# Patient Record
Sex: Male | Born: 1950 | Race: Black or African American | Hispanic: No | Smoking: Former smoker
Health system: Southern US, Community
[De-identification: ages and names within clinical notes are randomized; demographics above are authoritative.]

## PROBLEM LIST (undated history)

## (undated) DIAGNOSIS — J449 Chronic obstructive pulmonary disease, unspecified: Secondary | ICD-10-CM

## (undated) DIAGNOSIS — G8929 Other chronic pain: Secondary | ICD-10-CM

## (undated) DIAGNOSIS — K279 Peptic ulcer, site unspecified, unspecified as acute or chronic, without hemorrhage or perforation: Secondary | ICD-10-CM

## (undated) DIAGNOSIS — I10 Essential (primary) hypertension: Secondary | ICD-10-CM

## (undated) DIAGNOSIS — K852 Alcohol induced acute pancreatitis without necrosis or infection: Secondary | ICD-10-CM

## (undated) DIAGNOSIS — R519 Headache, unspecified: Secondary | ICD-10-CM

## (undated) DIAGNOSIS — E291 Testicular hypofunction: Secondary | ICD-10-CM

## (undated) DIAGNOSIS — C760 Malignant neoplasm of head, face and neck: Secondary | ICD-10-CM

## (undated) DIAGNOSIS — M549 Dorsalgia, unspecified: Secondary | ICD-10-CM

## (undated) DIAGNOSIS — K5790 Diverticulosis of intestine, part unspecified, without perforation or abscess without bleeding: Secondary | ICD-10-CM

## (undated) DIAGNOSIS — K861 Other chronic pancreatitis: Secondary | ICD-10-CM

## (undated) DIAGNOSIS — M25511 Pain in right shoulder: Secondary | ICD-10-CM

## (undated) DIAGNOSIS — R51 Headache: Secondary | ICD-10-CM

## (undated) DIAGNOSIS — E785 Hyperlipidemia, unspecified: Secondary | ICD-10-CM

## (undated) DIAGNOSIS — K219 Gastro-esophageal reflux disease without esophagitis: Secondary | ICD-10-CM

## (undated) DIAGNOSIS — F1010 Alcohol abuse, uncomplicated: Secondary | ICD-10-CM

## (undated) DIAGNOSIS — C642 Malignant neoplasm of left kidney, except renal pelvis: Secondary | ICD-10-CM

## (undated) DIAGNOSIS — K2970 Gastritis, unspecified, without bleeding: Secondary | ICD-10-CM

## (undated) DIAGNOSIS — Z923 Personal history of irradiation: Secondary | ICD-10-CM

## (undated) DIAGNOSIS — J189 Pneumonia, unspecified organism: Secondary | ICD-10-CM

## (undated) HISTORY — DX: Malignant neoplasm of left kidney, except renal pelvis: C64.2

## (undated) HISTORY — PX: LUNG BIOPSY: SHX232

## (undated) HISTORY — DX: Testicular hypofunction: E29.1

## (undated) HISTORY — DX: Other chronic pancreatitis: K86.1

## (undated) HISTORY — PX: ESOPHAGOGASTRODUODENOSCOPY: SHX1529

## (undated) HISTORY — PX: MOUTH SURGERY: SHX715

## (undated) HISTORY — DX: Alcohol induced acute pancreatitis without necrosis or infection: K85.20

## (undated) HISTORY — DX: Hyperlipidemia, unspecified: E78.5

## (undated) HISTORY — DX: Peptic ulcer, site unspecified, unspecified as acute or chronic, without hemorrhage or perforation: K27.9

## (undated) HISTORY — DX: Chronic obstructive pulmonary disease, unspecified: J44.9

---

## 1997-08-23 DIAGNOSIS — K279 Peptic ulcer, site unspecified, unspecified as acute or chronic, without hemorrhage or perforation: Secondary | ICD-10-CM

## 1997-08-23 HISTORY — DX: Peptic ulcer, site unspecified, unspecified as acute or chronic, without hemorrhage or perforation: K27.9

## 2001-08-23 HISTORY — PX: COLONOSCOPY: SHX174

## 2002-06-27 ENCOUNTER — Ambulatory Visit (HOSPITAL_COMMUNITY): Admission: RE | Admit: 2002-06-27 | Discharge: 2002-06-27 | Payer: Self-pay | Admitting: General Surgery

## 2003-08-24 HISTORY — PX: COLONOSCOPY: SHX174

## 2004-05-14 ENCOUNTER — Ambulatory Visit (HOSPITAL_COMMUNITY): Admission: RE | Admit: 2004-05-14 | Discharge: 2004-05-14 | Payer: Self-pay | Admitting: General Surgery

## 2006-08-23 HISTORY — PX: COLONOSCOPY: SHX174

## 2006-12-28 ENCOUNTER — Ambulatory Visit (HOSPITAL_COMMUNITY): Admission: RE | Admit: 2006-12-28 | Discharge: 2006-12-28 | Payer: Self-pay | Admitting: General Surgery

## 2007-11-04 ENCOUNTER — Inpatient Hospital Stay (HOSPITAL_COMMUNITY): Admission: EM | Admit: 2007-11-04 | Discharge: 2007-11-06 | Payer: Self-pay | Admitting: Emergency Medicine

## 2007-11-05 ENCOUNTER — Ambulatory Visit: Payer: Self-pay | Admitting: Internal Medicine

## 2007-11-06 ENCOUNTER — Ambulatory Visit: Payer: Self-pay | Admitting: Internal Medicine

## 2008-06-22 ENCOUNTER — Emergency Department (HOSPITAL_COMMUNITY): Admission: EM | Admit: 2008-06-22 | Discharge: 2008-06-22 | Payer: Self-pay | Admitting: Emergency Medicine

## 2010-08-23 DIAGNOSIS — C642 Malignant neoplasm of left kidney, except renal pelvis: Secondary | ICD-10-CM

## 2010-08-23 HISTORY — PX: PARTIAL NEPHRECTOMY: SHX414

## 2010-08-23 HISTORY — DX: Malignant neoplasm of left kidney, except renal pelvis: C64.2

## 2010-08-31 ENCOUNTER — Ambulatory Visit (HOSPITAL_COMMUNITY)
Admission: RE | Admit: 2010-08-31 | Discharge: 2010-08-31 | Payer: Self-pay | Source: Home / Self Care | Attending: Family Medicine | Admitting: Family Medicine

## 2010-10-29 ENCOUNTER — Emergency Department (HOSPITAL_COMMUNITY)
Admission: EM | Admit: 2010-10-29 | Discharge: 2010-10-29 | Disposition: A | Discharge status: Home / Self Care | Payer: Medicaid Other | Attending: Emergency Medicine | Admitting: Emergency Medicine

## 2010-10-29 ENCOUNTER — Emergency Department (HOSPITAL_COMMUNITY): Payer: Medicaid Other

## 2010-10-29 ENCOUNTER — Encounter (HOSPITAL_COMMUNITY): Payer: Self-pay | Admitting: Radiology

## 2010-10-29 DIAGNOSIS — W309XXA Contact with unspecified agricultural machinery, initial encounter: Secondary | ICD-10-CM | POA: Insufficient documentation

## 2010-10-29 DIAGNOSIS — R51 Headache: Secondary | ICD-10-CM | POA: Insufficient documentation

## 2010-10-29 DIAGNOSIS — I10 Essential (primary) hypertension: Secondary | ICD-10-CM | POA: Insufficient documentation

## 2010-10-29 DIAGNOSIS — S0280XA Fracture of other specified skull and facial bones, unspecified side, initial encounter for closed fracture: Secondary | ICD-10-CM | POA: Insufficient documentation

## 2010-10-29 DIAGNOSIS — S32009A Unspecified fracture of unspecified lumbar vertebra, initial encounter for closed fracture: Secondary | ICD-10-CM | POA: Insufficient documentation

## 2010-10-29 DIAGNOSIS — T148XXA Other injury of unspecified body region, initial encounter: Secondary | ICD-10-CM | POA: Insufficient documentation

## 2010-10-29 DIAGNOSIS — R079 Chest pain, unspecified: Secondary | ICD-10-CM | POA: Insufficient documentation

## 2010-10-29 DIAGNOSIS — R0602 Shortness of breath: Secondary | ICD-10-CM | POA: Insufficient documentation

## 2010-10-29 DIAGNOSIS — E119 Type 2 diabetes mellitus without complications: Secondary | ICD-10-CM | POA: Insufficient documentation

## 2010-10-29 HISTORY — DX: Essential (primary) hypertension: I10

## 2010-10-29 LAB — BASIC METABOLIC PANEL
Calcium: 9.7 mg/dL (ref 8.4–10.5)
GFR calc non Af Amer: 58 mL/min — ABNORMAL LOW (ref >60)
Glucose, Bld: 101 mg/dL — ABNORMAL HIGH (ref 70–99)
Sodium: 140 mEq/L (ref 135–145)

## 2010-10-29 MED ORDER — IOHEXOL 300 MG/ML  SOLN
100.0000 mL | Freq: Once | INTRAMUSCULAR | Status: AC | PRN
  Administered 2010-10-29: 100 mL via INTRAVENOUS

## 2010-11-04 ENCOUNTER — Other Ambulatory Visit (HOSPITAL_COMMUNITY): Payer: Self-pay | Admitting: Family Medicine

## 2010-11-04 DIAGNOSIS — N281 Cyst of kidney, acquired: Secondary | ICD-10-CM

## 2010-11-10 ENCOUNTER — Ambulatory Visit (HOSPITAL_COMMUNITY)
Admission: RE | Admit: 2010-11-10 | Discharge: 2010-11-10 | Payer: Medicaid Other | Source: Ambulatory Visit | Attending: Family Medicine | Admitting: Family Medicine

## 2010-11-26 ENCOUNTER — Ambulatory Visit (HOSPITAL_COMMUNITY)
Admission: RE | Admit: 2010-11-26 | Discharge: 2010-11-26 | Disposition: A | Discharge status: Home / Self Care | Payer: Self-pay | Source: Ambulatory Visit | Attending: Family Medicine | Admitting: Family Medicine

## 2010-11-26 ENCOUNTER — Other Ambulatory Visit (HOSPITAL_COMMUNITY): Payer: Self-pay | Admitting: Family Medicine

## 2010-11-26 ENCOUNTER — Encounter (HOSPITAL_COMMUNITY): Payer: Self-pay

## 2010-11-26 DIAGNOSIS — K769 Liver disease, unspecified: Secondary | ICD-10-CM | POA: Insufficient documentation

## 2010-11-26 DIAGNOSIS — N281 Cyst of kidney, acquired: Secondary | ICD-10-CM | POA: Insufficient documentation

## 2010-11-26 MED ORDER — IOHEXOL 350 MG/ML SOLN
100.0000 mL | Freq: Once | INTRAVENOUS | Status: AC | PRN
  Administered 2010-11-26: 100 mL via INTRAVENOUS

## 2011-01-05 NOTE — H&P (Signed)
NAMEKHAMBREL, AMSDEN               ACCOUNT NO.:  [id number]   MEDICAL RECORD NO.:  [id number]          PATIENT TYPE:  INP   LOCATION:  A213                          FACILITY:  [facility]   PHYSICIAN:  Catalina Pizza, M.D.        DATE OF BIRTH:  04-Jul-1951   DATE OF ADMISSION:  11/04/2007  DATE OF DISCHARGE:  LH                              HISTORY & PHYSICAL   PRIMARY DOCTOR:  Dr. John Giovanni   CHIEF COMPLAINT:  Blood in his stool.   HISTORY OF PRESENT ILLNESS:  Mr. Anthis is a pleasant 60 year old  gentleman who noted yesterday evening some dark discoloration in his  stool shortly followed by bright red blood per rectum.  He stated he had  some rumbling in his stomach earlier in the day that precipitated this  but did not have any significant pain.  He states that he had had an  episode of something similar to this back in July 2005, had a  colonoscopy with some colon polyps, but they did not feel that was  causing the bleed previously.  He recently had a colonoscopy by Dr.  Lovell Sheehan in May, I do not have the full results of that, but was told  everything was fine.  Of note, he also has been taking approximately six  BC Powder a day routinely for last several weeks but may have been  longer than that.  The patient was seen in the emergency department with  hemoglobin 12.3 and admitted due to a significant amount of blood in  stool.   PAST MEDICAL HISTORY:  Significant for:  1. Diabetes.  2. Hypercholesterolemia.  3. Chronic low-back pain.   PAST SURGICAL HISTORY:  None.   ALLERGIES:  No known drug allergies.   MEDICATIONS:  1. Metformin 500 mg t.i.d.  2. Vytorin 10/80 daily.  3. Omeprazole 20 mg b.i.d.  4. Vicodin 5/500 one to two a day p.r.n. along with a significant      amount of BC Powder.   REVIEW OF SYSTEMS:  The patient denies any problems with vision.  No  problems with hearing.  No problems with swallowing.  No shortness of  breath.  No chest pain.  No abdominal  pains.  No problems with  constipation or diarrhea.  Loose stools since noting the blood.  No  lower extremity swelling.  Denies any neurologic deficits.   SOCIAL HISTORY:  Lives with his mother, aunt and uncle.  Is on  disability, previously did [employer].  Smokes approximately two to  three cigarettes per day.   FAMILY HISTORY:  Mother still alive, has rheumatoid arthritis and  question of esophageal strictures.  No other cancers noted.  Father died  in his 66s.  History is relatively unknown.  No brothers or sisters.   PHYSICAL EXAMINATION:  VITAL SIGNS:  Temperature is afebrile, blood  pressure 132/70, pulse rate 72, respirations 20, saturating 97% on room  air.  GENERAL:  This is an [place] American male lying in bed in no acute  distress.  HEENT:  Unremarkable.  Pupils equal and react to  light accommodation.  No JVD.  No thyromegaly.  LUNGS:  Show a few end-expiratory wheezes at the bases bilaterally, but  good air movement throughout.  No rhonchi or crackles.  HEART:  Regular rate and rhythm.  No murmurs appreciated.  ABDOMEN:  Soft, nondistended, positive bowel sounds.  EXTREMITIES:  No lower extremity edema, cyanosis or clubbing.  NEUROLOGIC:  Alert and oriented x3.  No deficits noted.  RECTAL:  Performed in the emergency department which revealed gross  bright red blood.  No signs of acute bleeding hemorrhoid.   LABORATORY DATA:  CBC shows a white count of 8.9, hemoglobin of 12.3,  platelet count of 15.8, MCV of 88.7.  BMET shows sodium 141, potassium  4.7, chloride 109, CO2 27, glucose 81, BUN 15, creatinine 1.28, calcium  9.3.  PT is less than 10.  INR is not calculated.  Hepatic function was  normal with total protein 6.4, albumin of 3.7.   IMPRESSION:  This is a 60 year old African American male with apparent  lower gastrointestinal bleed here for further evaluation.   ASSESSMENT AND PLAN:  1. Lower gastrointestinal bleed.  This is most likely secondary to       question some colonic ulceration related to large amounts of BC      Powder, definitely contributing to thinning of his blood.  Does      have a history of colon polyps but did have a recent colonoscopy      back in May.  I do not have those results available at this time.      Also, possibility of diverticular bleed given his history.  Will      continue on a clear liquid diet for now and get GI to assess      further.  Will continue to monitor his hemoglobin.  Is not having a      continual bleeding from his rectum at this time.  No signs of      thinning blood per lab work obtained.  2. Diabetes mellitus type 2.  Will continue the metformin three times      a day a previously prescribed.  3. Hyperlipidemia.  Continue with Vytorin.  4. History of peptic ulcer disease, gastroesophageal reflux disease.      Continue with the omeprazole 20 mg b.i.d.  5. Tobacco abuse.  Educated the patient on quitting.  He is only      smoking approximately two to three per day.  6. Chronic back pain.  Will continue with p.r.n. Vicodin as needed in      the hospital.  He states he takes approximately one to two every      day on a routine basis.   DISPOSITION:  The patient will be admitted to a telemetry floor.  Large-  bore IV available.  Continue on low amount of fluid.  Will recheck lab  work in the morning and get a GI consult for this.      Catalina Pizza, M.D.  Electronically Signed     ZH/MEDQ  D:  11/04/2007  T:  11/05/2007  Job:  [id number]

## 2011-01-05 NOTE — Group Therapy Note (Signed)
NAME:  Colin Wagner, Colin [name]:  [id number]   MEDICAL RECORD NO.:  [id number]          PATIENT TYPE:  INP   LOCATION:  A213                          FACILITY:  [facility]   PHYSICIAN:  Catalina Pizza, M.D.        DATE OF BIRTH:  [birth year]   DATE OF PROCEDURE:  11/05/2007  DATE OF DISCHARGE:                                 PROGRESS NOTE   SUBJECTIVE:  Colin Wagner as a 60 year old African-American gentleman who  was admitted for hematochezia and bright red blood per rectum of unknown  origin.  He has not had any more episodes of this.  He was seen and  assessed by Tana Coast, PA for Dr. Jena Gauss, and suggestions made in her  consultation to continue the PPI therapy and supportive measures, and  will continue to monitor his therapy.  He states he has not had any more  blood in the stools.  He did have a normal bowel movement without any  black tarry stools or blood in them.  This morning, his hemoglobin did  drop slightly and will need to continue to monitor this.  Suggest also  to continue with clear liquid diet.   OBJECTIVE:  VITAL SIGNS:  Temperature is 97.2, blood pressure 136/75,  pulse 78, respirations 22, satting 96% on room air.  CBCs have been 97  and 103 respectively.  GENERAL:  This is an African-American male lying in bed in no acute  distress.  HEENT:  Unremarkable.  LUNGS:  Clear to auscultation bilaterally.  HEART:  Regular rate and rhythm.  ABDOMEN:  Protuberant, soft, nontender.  Positive bowel sounds.  EXTREMITIES:  No lower extremity edema.   LABORATORY DATA:  CBC shows a white count of 7.1, hemoglobin of 11.1  which is down from 12.3.  BMET shows a sodium of 138, potassium 4.0,  chloride 108, CO2 of 25, glucose 87, BUN 10, creatinine 1.12, calcium  8.6.   IMPRESSION:  This is a 60 year old with hematochezia or unknown source.  Question diverticular versus colitis type bleed.   ASSESSMENT/PLAN:  1. Lower gastrointestinal bleed.  Was seen and assessed by  GI, and      will follow along with their suggestion, likely related to aspirin-      induced type colitis versus diverticular type bleed.  Is not having      any significant problems at this time.  Awaiting further follow up      on hemoglobin and hematocrit to see if he has any further drop in      his hemoglobin.  Continue on a clear diet for now until assessed by      Dr. Jena Gauss to see if he wants to do a colonoscopy if it continues to      trend down.  2. Diabetes mellitus type 2 diabetes.  Continue on current regimen.      Appears to be doing well thus far.  3. Hyperlipidemia.  Continue on Vytorin.  4. Tobacco abuse.  Educated again on quitting.  5. Chronic back pain.  Apparently is doing well.  Has Vicodin  p.r.n.      for this.   DISPOSITION:  The patient will be followed up by Dr. Sudie Bailey and Dr.  Jena Gauss and see if there are any further interventions that need to be  done prior to discharge.      Catalina Pizza, M.D.  Electronically Signed     ZH/MEDQ  D:  11/05/2007  T:  11/05/2007  Job:  [id number]

## 2011-01-05 NOTE — Consult Note (Signed)
Colin Wagner, Colin Wagner               ACCOUNT NO.:  [id number]   MEDICAL RECORD NO.:  [id number]          PATIENT TYPE:  INP   LOCATION:  A213                          FACILITY:  [facility]   PHYSICIAN:  R. Roetta Sessions, M.D. DATE OF BIRTH:  11-26-1950   DATE OF CONSULTATION:  11/05/2007  DATE OF DISCHARGE:                                 CONSULTATION   PHYSICIAN REQUESTING CONSULTATION:  Mila Homer. Sudie Bailey, MD   REASON FOR CONSULTATION:  GI bleed.   HISTORY OF PRESENT ILLNESS:  The patient is a 59 year old African  American gentleman who presents with history of hematochezia.  Friday  evening, he began having an rumbling in his stomach.  He fell like he  was going to have diarrhea, almost like he had taken a laxative.  He had  5 or 6 stools which were loose and mixed with blood.  He describes the  blood as dark red, moderate amount.  He denies any abdominal pain.  His  last bloody stool was yesterday around noon.  He decided to come to the  emergency department for evaluation.  He denies any black stool.  He  says he has had this before as well.  He is had multiple colonoscopies  as outlined below.  He is concerned that his problem might be the amount  of BC Powders he is taking; he is taking up to 6 a day; has taken them  for a couple years this time, but off and on for years.  He denies any  weight loss, nausea, vomiting, heartburn, dysphagia or odynophagia.   MEDICATIONS AT HOME:  1. Vytorin 10/80 mg daily.  2. Metformin 500 mg t.i.d.  3. Omeprazole 20 mg b.i.d.  4. Vicodin 5/500 mg every 6 hours as needed.  5. BC powders 6 daily.   ALLERGIES:  No known drug allergies.   PAST MEDICAL HISTORY:  1. Diabetes mellitus.  2. Hyperlipidemia.  3. Hypertension.  4. He has a history of gastric ulcers diagnosed in 1999 by Dr. Elpidio Anis.  Biopsies were benign and no H. pylori present.  In 2001, it      was noted that his gastric ulcers had healed.  His last EGD was in  September 2005 by Dr. Katrinka Blazing and at that time, he had gastritis.  5. In November 2003, he did have polyps at time of colonoscopy by Dr.      Katrinka Blazing.  On 2005 colonoscopy, he had multiple diverticula noted from      the cecum to the sigmoid colon.  His last colonoscopy was by Dr.      Lovell Sheehan, May 2008; he was noted to have mild sigmoid      diverticulosis.   FAMILY HISTORY:  Negative for chronic GI illnesses, colorectal cancer,  peptic ulcer disease.   SOCIAL HISTORY:  He is single.  He lives with his mother and aunt.  He  smokes 2 cigarettes daily, trying to quit.  No alcohol or drug use.   REVIEW OF SYSTEMS:  See HPI for GI and CONSTITUTIONAL.  CARDIOPULMONARY:  He  denies any chest pain, shortness of breath, palpitations, or cough.   PHYSICAL EXAM:  VITAL SIGNS:  Temperature 96.8, pulse 67, respirations  15, blood pressure 133/71, O2 SATs 96% on room air.  GENERAL:  Pleasant, well-nourished, well-developed black gentleman in no  acute distress.  SKIN:  Warm and dry.  No jaundice.  HEENT:  Sclerae anicteric.  Oropharyngeal mucosa moist and pink.  NECK:  No lymphadenopathy.  CHEST:  Lungs clear to auscultation.  CARDIAC:  Exam reveals regular rate and rhythm, normal S1 and S2, no  murmurs, rubs or gallops.  ABDOMEN:  Positive bowel sounds.  The abdomen is soft, nontender and  nondistended.  No organomegaly or masses.  No rebound or guarding.  No  abdominal bruits or hernias.  RECTAL:  On rectal exam done in the ED, he was noted to have gross  blood, otherwise normal.  LOWER EXTREMITIES:  No edema.   LABORATORY DATA:  White count 7100, hemoglobin on admission 12.3 and is  11.1 this morning, MCV was normal, platelets 185,000.  Sodium 138,  potassium 4, BUN 10, creatinine 1.12. INR 1. LFTs normal with albumin of  3.7.   IMPRESSION:  The patient is 60 year old gentleman with hematochezia,  likely due to a lower gastrointestinal bleed from diverticular source.  Hemoglobin has dropped  minimally from 12.3 down to 11.1.  He is  hemodynamically stable.  There has been no overt bleeding noted since  his admission.  Given his significant aspirin use, however, we cannot  rule out an gastrointestinal bleed due to aspirin-induced colitis,  ulcers and so forth.  He does not appear to be having rapid transit  upper gastrointestinal bleed, given his hemodynamic stability, however.   RECOMMENDATIONS:  1. We will continue PPI therapy.  2. Supportive measures.  3. We will continue a clear liquid diet today.  We will monitor him      closely.  Most likely he will not need to have      a colonoscopy.  We will discuss further with Dr. Jena Gauss. Further      recommendations to follow.  4. H&H at noon and CBC in the morning.   I would like to thank Dr. John Giovanni for allowing [place] to take part  in the care of this patient.      Tana Coast, P.AJonathon Bellows, M.D.  Electronically Signed    LL/MEDQ  D:  11/05/2007  T:  11/05/2007  Job:  [id number]   cc:   Mila Homer. Sudie Bailey, M.D.  Fax: [phone]

## 2011-01-05 NOTE — Discharge Summary (Signed)
Colin Wagner, Colin Wagner               ACCOUNT NO.:  [id number]   MEDICAL RECORD NO.:  [id number]          PATIENT TYPE:  INP   LOCATION:  A213                          FACILITY:  [facility]   PHYSICIAN:  Mila Homer. Sudie Bailey, M.D.DATE OF BIRTH:  06-07-1951   DATE OF ADMISSION:  11/04/2007  DATE OF DISCHARGE:  03/16/2009LH                               DISCHARGE SUMMARY   A 60 year old was admitted to the hospital with GI bleeding.  He had a  benign 3-day hospitalization extending from November 04, 2007 to November 06, 2007.  Vital signs remained stable.   His admission white cell count was 8900.  The H&H 12.3 and 35.8, and  platelet count normal 208,000.  Following day, his hemoglobin was 11.1  and the day of discharge 11.4.  His admission BMET showed an estimated  GFR of greater than 60 and this stayed about the same.  His hepatic  function tests were normal.  INR 1.0.   He was admitted to the hospital by Dr. Catalina Pizza, on call from me.  He  was put on a clear-liquid diet, put on IV normal saline 75 mL an hour.  There was a GI consult due to the bleeding and by today, he was able to  have his diet advanced to 1800-calorie ADA heart-healthy diet.   The patient did well at this time.  Since the bleeding appeared to be  bright red, and since it was therefore lower GI tract, and since his  fairly recent colonoscopy had been essentially normal, it was elected  not to do another colonoscopy.  Particularly, since he did well,  bleeding stopped and his hemoglobin was stable.   He was discharged home his third day.   DIAGNOSES:  1. Lower gastrointestinal bleed.  2. 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  3. Type 2 diabetes mellitus.  4. Hypercholesterolemia.  5. History of peptic ulcer disease, gastroesophageal reflux disease.  6. Tobacco use disorder.  7. Headaches.  8. Chronic back pain.   He is being recommended to continue his metformin 500 mg t.i.d., Vytorin  10/80 daily, omeprazole 20  mg b.i.d., and Vicodin 5/500 as needed for  pain.  Also, I told him if it just is a minor headache to go ahead with  Tylenol Extra  Strength 2 q.i.d. since he rarely uses the Vicodin.  He should stay away  from [facility] powders, Advil, aspirin, BC, and other NSAIDs.  Use Tylenol  for headache and follow up with me in 4 days.  We will recheck on his  hemoglobin.      Mila Homer. Sudie Bailey, M.D.  Electronically Signed     SDK/MEDQ  D:  11/06/2007  T:  11/06/2007  Job:  [id number]

## 2011-01-08 NOTE — H&P (Signed)
Colin Wagner, Colin Wagner               ACCOUNT NO.:  [id number]   MEDICAL RECORD NO.:  [id number]          PATIENT TYPE:  AMB   LOCATION:                                FACILITY:  [facility]   PHYSICIAN:  Dalia Heading, M.D.  DATE OF BIRTH:  [birth year]   DATE OF ADMISSION:  12/28/2006  DATE OF DISCHARGE:  LH                              HISTORY & PHYSICAL   CHIEF COMPLAINT:  History of colon polyps.   HISTORY OF PRESENT ILLNESS:  The patient is a 60 year old black male who  is referred for endoscopic evaluation.  He needs a colonoscopy for  followup of colon polyps.  He last had a colonoscopy by Dr. Katrinka Blazing in  2003.  Ascending colon polyps were removed.  The patient now presents  for a followup colonoscopy.  The patient denies abdominal pain, melena,  or hematochezia.  There is no family history of colon carcinoma.   PAST MEDICAL HISTORY:  Non-insulin-dependent diabetes mellitus.   PAST SURGICAL HISTORY:  Unremarkable.   CURRENT MEDICATIONS:  Metformin, Vytorin, glipizide, Allegra.   ALLERGIES:  No known drug allergies.   SOCIAL HISTORY:  The patient smokes daily.  He denies any alcohol use.   PHYSICAL EXAMINATION:  GENERAL:  The patient is a well-developed, well-  nourished black male in no acute distress.  LUNGS:  Clear to auscultation with equal breath sounds bilaterally.  HEART:  Examination reveals a regular rate and rhythm without S3, S4, or  murmurs.  ABDOMEN:  Soft, nontender and non-distended.  No hepatosplenomegaly or  masses are noted.  RECTAL:  Examination was deferred to the procedure.   IMPRESSION:  Colon polyps.   PLAN:  The patient is scheduled for a colonoscopy on Dec 28, 2006.  The  risks and benefits of the procedure including bleeding and perforation  were fully explained to the patient, who gave informed consent.      Dalia Heading, M.D.  Electronically Signed     MAJ/MEDQ  D:  12/22/2006  T:  12/22/2006  Job:  [id number]   cc:   Mila Homer. Sudie Bailey,  M.D.  Fax: [phone]

## 2011-01-08 NOTE — H&P (Signed)
NAME:  Colin Wagner, Colin Wagner               ACCOUNT NO.:  [id number]   MEDICAL RECORD NO.:  [id number]          PATIENT TYPE:  AMB   LOCATION:  DAY                           FACILITY:  [facility]   PHYSICIAN:  Jerolyn Shin C. Katrinka Blazing, M.D.   DATE OF BIRTH:  1951-01-08   DATE OF ADMISSION:  DATE OF DISCHARGE:  LH                                HISTORY & PHYSICAL   A 60 year old male with a history of recurrent colon polyps, dating back to  2003, with the episode of recurrent rectal bleeding.  The patient also has a  history of diverticulosis.  Previous polyps were in the ascending colon  cecum and were adenomatous polyps.  The patient had no hemorrhoids  __________  that could be a source of bleeding.  It was, therefore, felt  that he should have a repeat colonoscopy.  The patient also has a history of  peptic ulcer disease with a history of gastroduodenitis and gastric ulcers.  He has used excess BC Powders with up to six BC Powders per day.  He is,  therefore, scheduled for upper and lower endoscopy.   PAST HISTORY:  1.  The patient has a history of chronic pain, especially low back pain.  2.  Hypercholesterolemia.   MEDICATIONS:  No new medications.   PHYSICAL EXAMINATION:  VITAL SIGNS:  Blood pressure 140/90, pulse 80,  respirations 18, weight 198 pounds.  HEENT:  Unremarkable.  NECK:  Supple.  No JVD, bruit, adenopathy, or thyromegaly.  CHEST:  Clear to auscultation.  HEART:  Regular rate and rhythm without murmur, gallop or rub.  ABDOMEN:  Soft and nontender.  No masses.  EXTREMITIES:  No cyanosis, clubbing, or edema.  NEUROLOGIC:  No focal motor, or sensory, or cerebellar deficits.   IMPRESSION:  1.  History of recurrent rectal bleeding.  2.  History of peptic ulcer disease with evidence of gastric ulcers and      heavy salicylate use.  3.  Prior history of alcohol abuse.   PLAN:  EGD and colonoscopy.    LCS/MEDQ  D:  05/14/2004  T:  05/14/2004  Job:  [id number]

## 2011-01-25 ENCOUNTER — Other Ambulatory Visit (HOSPITAL_COMMUNITY): Payer: Self-pay | Admitting: Family Medicine

## 2011-01-25 DIAGNOSIS — N289 Disorder of kidney and ureter, unspecified: Secondary | ICD-10-CM

## 2011-01-28 ENCOUNTER — Ambulatory Visit (HOSPITAL_COMMUNITY)
Admission: RE | Admit: 2011-01-28 | Discharge: 2011-01-28 | Disposition: A | Discharge status: Home / Self Care | Payer: PRIVATE HEALTH INSURANCE | Source: Ambulatory Visit | Attending: Family Medicine | Admitting: Family Medicine

## 2011-01-28 DIAGNOSIS — N289 Disorder of kidney and ureter, unspecified: Secondary | ICD-10-CM

## 2011-01-28 DIAGNOSIS — R9389 Abnormal findings on diagnostic imaging of other specified body structures: Secondary | ICD-10-CM | POA: Insufficient documentation

## 2011-01-28 MED ORDER — GADOBENATE DIMEGLUMINE 529 MG/ML IV SOLN
19.0000 mL | Freq: Once | INTRAVENOUS | Status: AC | PRN
  Administered 2011-01-28: 19 mL via INTRAVENOUS

## 2011-03-23 ENCOUNTER — Other Ambulatory Visit (HOSPITAL_COMMUNITY): Payer: Self-pay | Admitting: Urology

## 2011-03-23 ENCOUNTER — Ambulatory Visit (HOSPITAL_COMMUNITY)
Admission: RE | Admit: 2011-03-23 | Discharge: 2011-03-23 | Disposition: A | Discharge status: Home / Self Care | Payer: PRIVATE HEALTH INSURANCE | Source: Ambulatory Visit | Attending: Urology | Admitting: Urology

## 2011-03-23 DIAGNOSIS — I771 Stricture of artery: Secondary | ICD-10-CM | POA: Insufficient documentation

## 2011-03-23 DIAGNOSIS — C649 Malignant neoplasm of unspecified kidney, except renal pelvis: Secondary | ICD-10-CM | POA: Insufficient documentation

## 2011-03-23 DIAGNOSIS — E119 Type 2 diabetes mellitus without complications: Secondary | ICD-10-CM | POA: Insufficient documentation

## 2011-04-28 ENCOUNTER — Other Ambulatory Visit: Payer: Self-pay | Admitting: Urology

## 2011-04-28 ENCOUNTER — Encounter (HOSPITAL_COMMUNITY): Payer: Medicaid Other

## 2011-04-28 LAB — CBC
HCT: 41.5 % (ref 39.0–52.0)
MCV: 83.5 fL (ref 78.0–100.0)
RDW: 15.3 % (ref 11.5–15.5)
WBC: 7 10*3/uL (ref 4.0–10.5)

## 2011-04-28 LAB — BASIC METABOLIC PANEL
BUN: 23 mg/dL (ref 6–23)
Chloride: 102 mEq/L (ref 96–112)
Creatinine, Ser: 1.1 mg/dL (ref 0.50–1.35)
GFR calc non Af Amer: >60 mL/min (ref >60)
Glucose, Bld: 142 mg/dL — ABNORMAL HIGH (ref 70–99)
Sodium: 138 mEq/L (ref 135–145)

## 2011-04-28 LAB — SURGICAL PCR SCREEN: Staphylococcus aureus: NEGATIVE

## 2011-05-03 ENCOUNTER — Inpatient Hospital Stay (HOSPITAL_COMMUNITY)
Admission: RE | Admit: 2011-05-03 | Discharge: 2011-05-05 | DRG: 657 | Disposition: A | Discharge status: Home / Self Care | Payer: Medicaid Other | Source: Ambulatory Visit | Attending: Urology | Admitting: Urology

## 2011-05-03 ENCOUNTER — Other Ambulatory Visit: Payer: Self-pay | Admitting: Urology

## 2011-05-03 DIAGNOSIS — C649 Malignant neoplasm of unspecified kidney, except renal pelvis: Principal | ICD-10-CM | POA: Diagnosis present

## 2011-05-03 DIAGNOSIS — I10 Essential (primary) hypertension: Secondary | ICD-10-CM | POA: Diagnosis present

## 2011-05-03 DIAGNOSIS — E871 Hypo-osmolality and hyponatremia: Secondary | ICD-10-CM | POA: Diagnosis present

## 2011-05-03 DIAGNOSIS — K219 Gastro-esophageal reflux disease without esophagitis: Secondary | ICD-10-CM | POA: Diagnosis present

## 2011-05-03 DIAGNOSIS — E119 Type 2 diabetes mellitus without complications: Secondary | ICD-10-CM | POA: Diagnosis present

## 2011-05-03 DIAGNOSIS — Z87891 Personal history of nicotine dependence: Secondary | ICD-10-CM

## 2011-05-03 DIAGNOSIS — Z01812 Encounter for preprocedural laboratory examination: Secondary | ICD-10-CM

## 2011-05-03 DIAGNOSIS — E7800 Pure hypercholesterolemia, unspecified: Secondary | ICD-10-CM | POA: Diagnosis present

## 2011-05-03 LAB — BASIC METABOLIC PANEL
CO2: 22 mEq/L (ref 19–32)
Chloride: 97 mEq/L (ref 96–112)
Glucose, Bld: 184 mg/dL — ABNORMAL HIGH (ref 70–99)
Sodium: 130 mEq/L — ABNORMAL LOW (ref 135–145)

## 2011-05-03 LAB — TYPE AND SCREEN: Antibody Screen: NEGATIVE

## 2011-05-03 LAB — GLUCOSE, CAPILLARY: Glucose-Capillary: 134 mg/dL — ABNORMAL HIGH (ref 70–99)

## 2011-05-04 LAB — BASIC METABOLIC PANEL
BUN: 11 mg/dL (ref 6–23)
Chloride: 101 mEq/L (ref 96–112)
Creatinine, Ser: 1.02 mg/dL (ref 0.50–1.35)
GFR calc Af Amer: >60 mL/min (ref >60)
Glucose, Bld: 112 mg/dL — ABNORMAL HIGH (ref 70–99)

## 2011-05-04 LAB — GLUCOSE, CAPILLARY
Glucose-Capillary: 117 mg/dL — ABNORMAL HIGH (ref 70–99)
Glucose-Capillary: 157 mg/dL — ABNORMAL HIGH (ref 70–99)

## 2011-05-04 LAB — CREATININE, FLUID (PLEURAL, PERITONEAL, JP DRAINAGE): Creat, Fluid: 1.2 mg/dL

## 2011-05-05 LAB — BASIC METABOLIC PANEL
BUN: 13 mg/dL (ref 6–23)
Calcium: 9.1 mg/dL (ref 8.4–10.5)
Chloride: 100 mEq/L (ref 96–112)
Creatinine, Ser: 1.05 mg/dL (ref 0.50–1.35)
GFR calc Af Amer: >60 mL/min (ref >60)
GFR calc non Af Amer: >60 mL/min (ref >60)

## 2011-05-05 LAB — GLUCOSE, CAPILLARY: Glucose-Capillary: 202 mg/dL — ABNORMAL HIGH (ref 70–99)

## 2011-05-13 NOTE — Op Note (Signed)
NAMEJARMAINE, Colin [name]:  [id number]  MEDICAL RECORD NO.:  [id number]  LOCATION:  1404                         FACILITY:  [facility]  PHYSICIAN:  Heloise Purpura, MD      DATE OF BIRTH:  1951/02/13  DATE OF PROCEDURE:  05/03/2011 DATE OF DISCHARGE:                              OPERATIVE REPORT   PREOPERATIVE DIAGNOSIS:  Left renal neoplasm.  POSTOPERATIVE DIAGNOSIS:  Left renal neoplasm.  PROCEDURE: 1. Left robotic-assisted laparoscopic partial nephrectomy. 2. Intraoperative renal ultrasound.  SURGEON:  Heloise Purpura, MD  ASSISTANT:  Delia Chimes, [facility]  ANESTHESIA:  General.  COMPLICATIONS:  None.  ESTIMATED BLOOD LOSS:  100 cc.  INTRAVENOUS FLUIDS:  2700 cc of lactated Ringer's.  INTRAOPERATIVE FINDINGS: 1. Warm ischemia time to the left kidney was 24 minutes. 2. Intraoperative ultrasound of the left kidney demonstrated a 1.6-cm     renal mass over the anterolateral aspect of the left kidney     consistent with his preoperative imaging.  SPECIMEN:  Left renal neoplasm.  DISPOSITION OF SPECIMEN:  To pathology.  INDICATIONS:  Mr. Paige is a 60 year old gentleman who was incidentally found to have an enhancing left renal neoplasm concerning for malignancy.  He was evaluated with a metastatic evaluation, which was negative.  After reviewing management options for treatment, he elected to proceed with surgical therapy in a nephron-sparing approach as stated above with the stated procedure.  The potential risks, complications, and alternative treatment options were discussed in detail and informed consent obtained.  DESCRIPTION OF PROCEDURE:  The patient was taken to the operating room and a general anesthetic was administered.  He was given preoperative antibiotics, placed in the left lateral decubitus position, and prepped and draped in the usual sterile fashion.  He was then prepped and draped in the usual sterile fashion and a site was selected  just to the left of the umbilicus toward the lateral aspect of the rectus muscle and carried down through the subcutaneous tissues using electrocautery.  The underlying fascial and muscular layers were then divided and separated allowing entry into the peritoneal cavity under direct vision without difficulty.  A 12-mm port was then placed and a pneumoperitoneum established.  8 mm robotic ports were then placed in the left upper quadrant, left lower quadrant, and far left lower quadrant.  A 12-mm port was placed in the upper midline for laparoscopic assistance.  All ports were placed under direct vision without difficulty.  The surgical cart was then docked.  The patient was noted have some adhesions between the colon and the anterior abdominal wall and these were carefully taken down.  This required quite extensive mobilization of the colon in order to expose the kidney.  This was performed without significant complications.  Once the colon was mobilized, the space between the mesocolon and the anterior layer of Gerota fascia was developed thereby isolating the kidney away from the colon and colonic mesentery.  The ureter and gonadal vein were identified and were able to be lifted anteriorly off the psoas muscle and dissection proceeded superiorly to the renal vein.  The gonadal vein was seen entering the renal vein and the renal vein was carefully  dissected free using a combination of sharp and blunt dissection.  Once the renal vein was isolated, attention turned to the renal artery.  There was noted to be an excessive amount of perinephric fat and this required some added dissection.  However, a single renal artery was identified just superior and posterior to the renal vein.  It was able to be isolated with a combination of sharp and blunt dissection and once the vessels were felt to be adequately isolated for renal hilar clamping, attention turned to the kidney itself.  12.5 g of  intravenous mannitol was administered while the perinephric fat was removed from the surface of the kidney.  Once the entire anterolateral aspect of the kidney was exposed, no readily apparent renal neoplasm was initially identified.  Therefore, the kidney was completely mobilized within Gerota fascia and further identified. There was some adherent perinephric fat on the anterolateral aspect of the kidney and it was felt that the tumor might possibly be beneath this fatty tissue versus completely endophytic.  At this point, the laparoscopic ultrasound was used intraoperatively to image the kidney. Imaging of the kidney did reveal a 1.6-cm solid-appearing mass that was located just below the adherent patch of perinephric fat on the anterior aspect of the kidney.  This correlated with preoperative imaging findings as well.  The margins of the resection were then marked out having utilized ultrasound to define these margins.  Preparations were then made for hilar clamping and a bulldog clamp was placed on the renal artery.  Using sharp scissor dissection, the renal parenchyma was then incised around the previously identified margin and taken deeply into the renal medulla.  Grossly, the tumor was excised in its entirety with no definite entries into the collecting system noted.  Once the tumor had been excised, preparations were made for reconstruction of the kidney.  A 3-0 V-Loc suture was brought through the renal capsule into the parenchymal defect and secured with a Hem-o-lok clip on the capsular surface.  It was then run through the renal defect for hemostatic purposes in the deep resected bed.  This also provided some initial renal compression.  It was then brought through the opposite end of the renal capsule and again secured with a Hem-o-lok on this side.  A double- armed 2-0 V-Loc suture was then brought through the renal capsule for the capsular reapproximation and renal  compression.  This suture was brought through the kidney in a horizontal mattress fashion and prior to securing the suture, FloSeal was placed into the renal defect.  This resulted in excellent hemostasis once the V-Loc suture was cinched down. Hem-o-lok clips were then placed on the end of the V-Loc sutures and these were cut and all needles were removed.  The bulldog clamp was removed and there appeared to be excellent hemostasis.  Additional FloSeal was placed near the renal hilum and the total warm ischemia time to the left kidney was 24 minutes.  The pneumoperitoneum was let down and there continued to be excellent hemostasis.  A #15 Blake drain was brought through the fourth arm robotic port and positioned in the perinephric space appropriately.  It was secured to skin with a nylon suture.  The surgical cart was then undocked.  Utilizing the remaining ports, the upper midline 12-mm port site was closed with a 0 Vicryl suture placed laparoscopically.  The remaining ports were removed under direct vision, and the renal tumor specimen which had been placed into an Endopouch retrieval bag, was  now removed via the camera port incision without difficulty.  This opening was then closed with two  0 Vicryl sutures at the fascial level and all port sites were injected with Exparel and reapproximated at the skin level with 4-0 Monocryl subcuticular closures.  Dermabond was applied to the skin.  The patient tolerated the procedure well without complications.  He was able to be extubated and transferred to the recovery unit in satisfactory condition.     Heloise Purpura, MD     LB/MEDQ  D:  05/03/2011  T:  05/03/2011  Job:  [id number]  Electronically Signed by Heloise Purpura MD on 05/13/2011 08:12:21 PM

## 2011-05-13 NOTE — Discharge Summary (Signed)
  NAMELESHON, [name]:  [id number]  MEDICAL RECORD NO.:  [id number]  LOCATION:  1404                         FACILITY:  [facility]  PHYSICIAN:  Heloise Purpura, MD      DATE OF BIRTH:  March 12, 1951  DATE OF ADMISSION:  05/03/2011 DATE OF DISCHARGE:  05/05/2011                              DISCHARGE SUMMARY   REASON FOR ADMISSION:  Left renal neoplasm.  DISCHARGE DIAGNOSIS:  Renal cell carcinoma.  HISTORY AND PHYSICAL:  For full details, please see admission history and physical.  Briefly, [name] is a 60 year old gentleman who was found to have an incidentally detected enhancing left renal neoplasm concerning for malignancy.  He underwent metastatic evaluation, which was negative.  After reviewing management options for treatment, he elected to proceed with minimally invasive nephron-sparing surgery.  SECONDARY DIAGNOSIS:  Diabetes.  HOSPITAL COURSE:  On May 03, 2011, the patient was taken to the operating room and underwent a left robotic-assisted laparoscopic partial nephrectomy.  He tolerated this procedure well without complications.  He did require the use of intraoperative ultrasound to identify his mass and he was able to be transferred to a regular hospital room following recovery from anesthesia.  Postoperatively, he was initially maintained on strict bedrest and remained hemodynamically stable.  His hemoglobin was 12.5 postoperatively.  His renal function also remained stable and his creatinine was 1.05 upon discharge.  On postoperative day #1, he began ambulating which he did without significant difficulty and was transitioned to oral pain medication.  In addition, his diet was gradually advanced to a diabetic diet throughout the day, which he tolerated well.  He did have return of bowel function. He maintained excellent urine output with minimal output from his perinephric drain.  His perinephric drain was checked for a fluid creatinine  and this was found to be 1.2 consistent with serum.  It was therefore removed on the morning of postoperative day #2.  He remained stable and had met all discharge criteria on the morning of postoperative day #2.  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  The implications of this pathology report was discussed with the patient during his hospitalization.  DISPOSITION:  Home.  DISCHARGE MEDICATIONS:  He will resume all of his regular home medications.  In addition, he has been given a prescription to continue hydrocodone to take as needed for pain.  He does use his medication chronically on a very intermittent basis for back pain and will use this more frequently as needed for incisional pain.  DISCHARGE INSTRUCTIONS:  He will resume his diet as before hospital admission.  He will ambulate, but refrain from any strenuous activity, driving, or heavy lifting.  FOLLOWUP:  He will follow up as scheduled in 3 to 4 weeks for further postoperative evaluation and to recheck his renal function.     Heloise Purpura, MD     LB/MEDQ  D:  05/05/2011  T:  05/05/2011  Job:  [id number]  Electronically Signed by Heloise Purpura MD on 05/13/2011 08:12:18 PM

## 2011-05-17 LAB — BASIC METABOLIC PANEL
BUN: 15
CO2: 25
Calcium: 9.3
Calcium: 8.6
Creatinine, Ser: 1.28
GFR calc Af Amer: >60
GFR calc non Af Amer: 58 — ABNORMAL LOW
GFR calc non Af Amer: >60
Glucose, Bld: 81
Glucose, Bld: 87
Potassium: 4.7
Potassium: 4
Sodium: 138

## 2011-05-17 LAB — HEPATIC FUNCTION PANEL
ALT: 20
Albumin: 3.7
Alkaline Phosphatase: 45
Indirect Bilirubin: 0.3
Total Protein: 6.4

## 2011-05-17 LAB — CBC
HCT: 31.8 — ABNORMAL LOW
HCT: 32.5 — ABNORMAL LOW
Hemoglobin: 11.1 — ABNORMAL LOW
Hemoglobin: 11.4 — ABNORMAL LOW
MCHC: 35
MCV: 89.7
Platelets: 208
RBC: 3.57 — ABNORMAL LOW
RBC: 3.62 — ABNORMAL LOW
RDW: 15.8 — ABNORMAL HIGH
WBC: 8.9
WBC: 6.7

## 2011-05-17 LAB — DIFFERENTIAL
Basophils Absolute: 0
Basophils Relative: 0
Basophils Relative: 0
Eosinophils Absolute: 0.1
Eosinophils Relative: 1
Eosinophils Relative: 2
Lymphocytes Relative: 19
Lymphocytes Relative: 23
Lymphs Abs: 1.7
Lymphs Abs: 1.5
Lymphs Abs: 1.6
Monocytes Absolute: 0.5
Monocytes Relative: 6
Monocytes Relative: 7
Monocytes Relative: 9
Neutro Abs: 4.8
Neutrophils Relative %: 74

## 2011-11-02 ENCOUNTER — Other Ambulatory Visit (HOSPITAL_COMMUNITY): Payer: Self-pay | Admitting: Family Medicine

## 2011-11-02 ENCOUNTER — Ambulatory Visit (HOSPITAL_COMMUNITY)
Admission: RE | Admit: 2011-11-02 | Discharge: 2011-11-02 | Disposition: A | Discharge status: Home / Self Care | Payer: Medicaid Other | Source: Ambulatory Visit | Attending: Family Medicine | Admitting: Family Medicine

## 2011-11-02 DIAGNOSIS — M25511 Pain in right shoulder: Secondary | ICD-10-CM

## 2011-11-02 DIAGNOSIS — M25519 Pain in unspecified shoulder: Secondary | ICD-10-CM | POA: Insufficient documentation

## 2011-11-26 ENCOUNTER — Ambulatory Visit (HOSPITAL_COMMUNITY)
Admission: RE | Admit: 2011-11-26 | Discharge: 2011-11-26 | Disposition: A | Discharge status: Home / Self Care | Payer: Medicaid Other | Source: Ambulatory Visit | Attending: Urology | Admitting: Urology

## 2011-11-26 ENCOUNTER — Other Ambulatory Visit (HOSPITAL_COMMUNITY): Payer: Self-pay | Admitting: Urology

## 2011-11-26 DIAGNOSIS — Z87891 Personal history of nicotine dependence: Secondary | ICD-10-CM | POA: Insufficient documentation

## 2011-11-26 DIAGNOSIS — C649 Malignant neoplasm of unspecified kidney, except renal pelvis: Secondary | ICD-10-CM

## 2011-11-26 DIAGNOSIS — E119 Type 2 diabetes mellitus without complications: Secondary | ICD-10-CM | POA: Insufficient documentation

## 2011-12-03 ENCOUNTER — Other Ambulatory Visit (HOSPITAL_COMMUNITY): Payer: Self-pay | Admitting: Urology

## 2011-12-03 ENCOUNTER — Ambulatory Visit (HOSPITAL_COMMUNITY)
Admission: RE | Admit: 2011-12-03 | Discharge: 2011-12-03 | Disposition: A | Discharge status: Home / Self Care | Payer: Medicaid Other | Source: Ambulatory Visit | Attending: Urology | Admitting: Urology

## 2011-12-03 DIAGNOSIS — E119 Type 2 diabetes mellitus without complications: Secondary | ICD-10-CM | POA: Insufficient documentation

## 2011-12-03 DIAGNOSIS — C649 Malignant neoplasm of unspecified kidney, except renal pelvis: Secondary | ICD-10-CM | POA: Insufficient documentation

## 2011-12-03 DIAGNOSIS — R911 Solitary pulmonary nodule: Secondary | ICD-10-CM | POA: Insufficient documentation

## 2011-12-20 ENCOUNTER — Other Ambulatory Visit: Payer: Self-pay | Admitting: Urology

## 2011-12-20 ENCOUNTER — Ambulatory Visit (HOSPITAL_COMMUNITY)
Admission: RE | Admit: 2011-12-20 | Discharge: 2011-12-20 | Disposition: A | Discharge status: Home / Self Care | Payer: Medicaid Other | Source: Ambulatory Visit | Attending: Urology | Admitting: Urology

## 2011-12-20 DIAGNOSIS — C649 Malignant neoplasm of unspecified kidney, except renal pelvis: Secondary | ICD-10-CM | POA: Insufficient documentation

## 2011-12-20 DIAGNOSIS — R918 Other nonspecific abnormal finding of lung field: Secondary | ICD-10-CM | POA: Insufficient documentation

## 2012-06-05 ENCOUNTER — Ambulatory Visit (HOSPITAL_COMMUNITY)
Admission: RE | Admit: 2012-06-05 | Discharge: 2012-06-05 | Disposition: A | Discharge status: Home / Self Care | Payer: Medicaid Other | Source: Ambulatory Visit | Attending: Urology | Admitting: Urology

## 2012-06-05 ENCOUNTER — Other Ambulatory Visit (HOSPITAL_COMMUNITY): Payer: Self-pay | Admitting: Urology

## 2012-06-05 DIAGNOSIS — Z87891 Personal history of nicotine dependence: Secondary | ICD-10-CM | POA: Insufficient documentation

## 2012-06-05 DIAGNOSIS — C649 Malignant neoplasm of unspecified kidney, except renal pelvis: Secondary | ICD-10-CM

## 2012-06-05 DIAGNOSIS — E119 Type 2 diabetes mellitus without complications: Secondary | ICD-10-CM | POA: Insufficient documentation

## 2013-04-02 ENCOUNTER — Ambulatory Visit (HOSPITAL_COMMUNITY)
Admission: RE | Admit: 2013-04-02 | Discharge: 2013-04-02 | Disposition: A | Discharge status: Home / Self Care | Payer: Medicaid Other | Source: Ambulatory Visit | Attending: Family Medicine | Admitting: Family Medicine

## 2013-04-02 ENCOUNTER — Other Ambulatory Visit (HOSPITAL_COMMUNITY): Payer: Self-pay | Admitting: Family Medicine

## 2013-04-02 DIAGNOSIS — J189 Pneumonia, unspecified organism: Secondary | ICD-10-CM

## 2013-06-04 ENCOUNTER — Ambulatory Visit (HOSPITAL_COMMUNITY)
Admission: RE | Admit: 2013-06-04 | Discharge: 2013-06-04 | Disposition: A | Discharge status: Home / Self Care | Payer: Medicaid Other | Source: Ambulatory Visit | Attending: Urology | Admitting: Urology

## 2013-06-04 ENCOUNTER — Other Ambulatory Visit (HOSPITAL_COMMUNITY): Payer: Self-pay | Admitting: Urology

## 2013-06-04 DIAGNOSIS — C649 Malignant neoplasm of unspecified kidney, except renal pelvis: Secondary | ICD-10-CM

## 2013-06-04 DIAGNOSIS — Z87891 Personal history of nicotine dependence: Secondary | ICD-10-CM | POA: Insufficient documentation

## 2014-02-07 ENCOUNTER — Encounter (INDEPENDENT_AMBULATORY_CARE_PROVIDER_SITE_OTHER): Payer: Self-pay

## 2014-02-07 ENCOUNTER — Ambulatory Visit (HOSPITAL_COMMUNITY)
Admission: RE | Admit: 2014-02-07 | Discharge: 2014-02-07 | Disposition: A | Discharge status: Home / Self Care | Payer: Medicaid Other | Source: Ambulatory Visit | Attending: Internal Medicine | Admitting: Internal Medicine

## 2014-02-07 ENCOUNTER — Other Ambulatory Visit (HOSPITAL_COMMUNITY): Payer: Self-pay | Admitting: Internal Medicine

## 2014-02-07 DIAGNOSIS — M545 Low back pain, unspecified: Secondary | ICD-10-CM

## 2014-02-07 DIAGNOSIS — K861 Other chronic pancreatitis: Secondary | ICD-10-CM | POA: Insufficient documentation

## 2014-02-07 DIAGNOSIS — M51379 Other intervertebral disc degeneration, lumbosacral region without mention of lumbar back pain or lower extremity pain: Secondary | ICD-10-CM | POA: Insufficient documentation

## 2014-02-07 DIAGNOSIS — M5137 Other intervertebral disc degeneration, lumbosacral region: Secondary | ICD-10-CM | POA: Insufficient documentation

## 2014-03-03 ENCOUNTER — Encounter (HOSPITAL_COMMUNITY): Payer: Self-pay | Admitting: Emergency Medicine

## 2014-03-03 ENCOUNTER — Emergency Department (HOSPITAL_COMMUNITY)
Admission: EM | Admit: 2014-03-03 | Discharge: 2014-03-03 | Disposition: A | Discharge status: Home / Self Care | Payer: Medicaid Other | Attending: Emergency Medicine | Admitting: Emergency Medicine

## 2014-03-03 ENCOUNTER — Emergency Department (HOSPITAL_COMMUNITY): Payer: Medicaid Other

## 2014-03-03 DIAGNOSIS — R05 Cough: Secondary | ICD-10-CM | POA: Diagnosis present

## 2014-03-03 DIAGNOSIS — R51 Headache: Secondary | ICD-10-CM | POA: Diagnosis not present

## 2014-03-03 DIAGNOSIS — J40 Bronchitis, not specified as acute or chronic: Secondary | ICD-10-CM

## 2014-03-03 DIAGNOSIS — IMO0001 Reserved for inherently not codable concepts without codable children: Secondary | ICD-10-CM | POA: Diagnosis not present

## 2014-03-03 DIAGNOSIS — J209 Acute bronchitis, unspecified: Secondary | ICD-10-CM | POA: Diagnosis not present

## 2014-03-03 DIAGNOSIS — R059 Cough, unspecified: Secondary | ICD-10-CM | POA: Diagnosis present

## 2014-03-03 DIAGNOSIS — Z792 Long term (current) use of antibiotics: Secondary | ICD-10-CM | POA: Diagnosis not present

## 2014-03-03 DIAGNOSIS — I10 Essential (primary) hypertension: Secondary | ICD-10-CM | POA: Diagnosis not present

## 2014-03-03 DIAGNOSIS — Z79899 Other long term (current) drug therapy: Secondary | ICD-10-CM | POA: Insufficient documentation

## 2014-03-03 DIAGNOSIS — E119 Type 2 diabetes mellitus without complications: Secondary | ICD-10-CM | POA: Insufficient documentation

## 2014-03-03 LAB — CBG MONITORING, ED: GLUCOSE-CAPILLARY: 179 mg/dL — AB (ref 70–99)

## 2014-03-03 MED ORDER — ALBUTEROL SULFATE HFA 108 (90 BASE) MCG/ACT IN AERS
1.0000 | INHALATION_SPRAY | Freq: Four times a day (QID) | RESPIRATORY_TRACT | Status: DC | PRN
Start: 2014-03-03 — End: 2016-09-25

## 2014-03-03 MED ORDER — AZITHROMYCIN 250 MG PO TABS: ORAL_TABLET | ORAL | Status: DC

## 2014-03-03 NOTE — ED Notes (Signed)
MD at bedside. 

## 2014-03-03 NOTE — ED Notes (Signed)
Pt reports nonproductive cough, runny nose, body aches, and chills since Friday.

## 2014-03-03 NOTE — ED Provider Notes (Signed)
CSN: [id number]     Arrival date & time 03/03/14  9528 History  This chart was scribed for Nat Christen, MD by Vernell Barrier, ED scribe. This patient was seen in room APA18/APA18 and the patient's care was started at 7:35 AM.    Chief Complaint  Patient presents with  . URI   The history is provided by the patient. No language interpreter was used.   HPI Comments: STEN DEMATTEO is a 63 y.o. male who presents to the Emergency Department with 2 days of some mild clear to yellow productive cough, rhinorrhea, myalgias. Onset of chills last night. Myalgias primarily in the lower extremities. Also report mild frontal HA; believes it may be related to allergies. Denies smoking cigarettes. Denies SOB and wheezing.   PCP: Dr. Karie Kirks  Past Medical History  Diagnosis Date  . Diabetes mellitus   . Hypertension    No past surgical history on file. No family history on file. History  Substance Use Topics  . Smoking status: Not on file  . Smokeless tobacco: Not on file  . Alcohol Use: Not on file    Review of Systems  Constitutional: Positive for chills.  HENT: Positive for rhinorrhea.   Respiratory: Positive for cough. Negative for wheezing.   Musculoskeletal: Positive for myalgias.  Neurological: Positive for headaches.   A complete 10 system review of systems was obtained and all systems are negative except as noted in the HPI and PMH.   Allergies  Review of patient's allergies indicates no known allergies.  Home Medications   Prior to Admission medications   Medication Sig Start Date End Date Taking? Authorizing Provider  albuterol (PROVENTIL HFA;VENTOLIN HFA) 108 (90 BASE) MCG/ACT inhaler Inhale 1-2 puffs into the lungs every 6 (six) hours as needed for wheezing or shortness of breath. 03/03/14   Nat Christen, MD  azithromycin (ZITHROMAX Z-PAK) 250 MG tablet 2 po day one, then 1 daily x 4 days 03/03/14   Nat Christen, MD   Triage vitals: BP 128/81  Pulse 108  Temp(Src) 99.1 F  (37.3 C) (Oral)  Resp 18  Ht 6' (1.829 m)  Wt 191 lb (86.637 kg)  BMI 25.90 kg/m2  SpO2 95%  Physical Exam  Nursing note and vitals reviewed. Constitutional: He is oriented to person, place, and time. He appears well-developed and well-nourished.  HENT:  Head: Normocephalic and atraumatic.  Right Ear: External ear normal.  Left Ear: External ear normal.  Mouth/Throat: Oropharynx is clear and moist.  Eyes: Conjunctivae and EOM are normal. Pupils are equal, round, and reactive to light.  Neck: Normal range of motion. Neck supple.  Cardiovascular: Normal rate, regular rhythm and normal heart sounds.   No murmur heard. Pulmonary/Chest: Effort normal and breath sounds normal. No respiratory distress. He has no wheezes. He has no rales.  Abdominal: Soft. Bowel sounds are normal.  Musculoskeletal: Normal range of motion.  Neurological: He is alert and oriented to person, place, and time.  Skin: Skin is warm and dry.  Psychiatric: He has a normal mood and affect. His behavior is normal.    ED Course  Procedures (including critical care time) DIAGNOSTIC STUDIES: Oxygen Saturation is 95% on room air, normal by my interpretation.    COORDINATION OF CARE: At 7:36 AM: Discussed treatment plan with patient which includes chest x-ray. Patient agrees.   Results for orders placed during the hospital encounter of 03/03/14  CBG MONITORING, ED      Result Value Ref Range   Glucose-Capillary  179 (*) 70 - 99 mg/dL    Labs Review Labs Reviewed  CBG MONITORING, ED - Abnormal; Notable for the following:    Glucose-Capillary 179 (*)    All other components within normal limits    Imaging Review Dg Chest 2 View  03/03/2014   CLINICAL DATA:  Cough and fever.  EXAM: CHEST  2 VIEW  COMPARISON:  06/04/2013.  06/05/2012.  03/23/2011.  06/22/2008.  FINDINGS: Mediastinum and hilar structures are normal. Changes of scarring right middle lobe and lingula. Heart size normal. No acute infiltrate. No  pleural effusion or pneumothorax. No acute bony abnormality .  IMPRESSION: Right middle lobe and lingular scarring. No acute cardiopulmonary disease.   Electronically Signed   By: Marcello Moores  Register   On: 03/03/2014 08:02     EKG Interpretation None      MDM   Final diagnoses:  Bronchitis   patient is nontoxic. Productive sputum. Patient is diabetic. Will Rx antibiotic and inhaler.    I personally performed the services described in this documentation, which was scribed in my presence. The recorded information has been reviewed and is accurate.     Nat Christen, MD 03/03/14 [phone]

## 2014-03-03 NOTE — Discharge Instructions (Signed)
Bronchitis Bronchitis is swelling (inflammation) of the air tubes leading to your lungs (bronchi). This causes mucus and a cough. If the swelling gets bad, you may have trouble breathing. HOME CARE   Rest.  Drink enough fluids to keep your pee (urine) clear or pale yellow (unless you have a condition where you have to watch how much you drink).  Only take medicine as told by your doctor. If you were given antibiotic medicines, finish them even if you start to feel better.  Avoid smoke, irritating chemicals, and strong smells. These make the problem worse. Quit smoking if you smoke. This helps your lungs heal faster.  Use a cool mist humidifier. Change the water in the humidifier every day. You can also sit in the bathroom with hot shower running for 5-10 minutes. Keep the door closed.  See your health care provider as told.  Wash your hands often. GET HELP IF: Your problems do not get better after 1 week. GET HELP RIGHT AWAY IF:   Your fever gets worse.  You have chills.  Your chest hurts.  Your problems breathing get worse.  You have blood in your mucus.  You pass out (faint).  You feel light-headed.  You have a bad headache.  You throw up (vomit) again and again. MAKE SURE YOU:  Understand these instructions.  Will watch your condition.  Will get help right away if you are not doing well or get worse. Document Released: 01/26/2008 Document Revised: 08/14/2013 Document Reviewed: 04/03/2013 [facility] Patient Information 2015 [place], [place]. This information is not intended to replace advice given to you by your health care provider. Make sure you discuss any questions you have with your health care provider.  Chest x-ray shows no pneumonia. Prescription for antibiotic and inhaler. Increase fluids. Tylenol or ibuprofen for fever.

## 2014-05-28 ENCOUNTER — Other Ambulatory Visit (HOSPITAL_COMMUNITY): Payer: Self-pay | Admitting: Urology

## 2014-05-28 DIAGNOSIS — D49519 Neoplasm of unspecified behavior of unspecified kidney: Secondary | ICD-10-CM

## 2014-06-07 ENCOUNTER — Ambulatory Visit (HOSPITAL_COMMUNITY)
Admission: RE | Admit: 2014-06-07 | Discharge: 2014-06-07 | Disposition: A | Discharge status: Home / Self Care | Payer: Medicaid Other | Source: Ambulatory Visit | Attending: Urology | Admitting: Urology

## 2014-06-07 ENCOUNTER — Other Ambulatory Visit (HOSPITAL_COMMUNITY): Payer: Self-pay | Admitting: Urology

## 2014-06-07 DIAGNOSIS — D49519 Neoplasm of unspecified behavior of unspecified kidney: Secondary | ICD-10-CM

## 2014-06-07 DIAGNOSIS — D495 Neoplasm of unspecified behavior of other genitourinary organs: Secondary | ICD-10-CM | POA: Insufficient documentation

## 2014-06-07 DIAGNOSIS — Z905 Acquired absence of kidney: Secondary | ICD-10-CM | POA: Diagnosis not present

## 2014-06-07 MED ORDER — GADOBENATE DIMEGLUMINE 529 MG/ML IV SOLN
18.0000 mL | Freq: Once | INTRAVENOUS | Status: AC | PRN
  Administered 2014-06-07: 18 mL via INTRAVENOUS

## 2014-08-23 DIAGNOSIS — K852 Alcohol induced acute pancreatitis without necrosis or infection: Secondary | ICD-10-CM

## 2014-08-23 HISTORY — DX: Alcohol induced acute pancreatitis without necrosis or infection: K85.20

## 2014-09-13 ENCOUNTER — Emergency Department (HOSPITAL_COMMUNITY)
Admission: EM | Admit: 2014-09-13 | Discharge: 2014-09-13 | Disposition: A | Discharge status: Home / Self Care | Payer: Medicaid Other | Attending: Emergency Medicine | Admitting: Emergency Medicine

## 2014-09-13 ENCOUNTER — Encounter (HOSPITAL_COMMUNITY): Payer: Self-pay

## 2014-09-13 DIAGNOSIS — R369 Urethral discharge, unspecified: Secondary | ICD-10-CM | POA: Diagnosis present

## 2014-09-13 DIAGNOSIS — E119 Type 2 diabetes mellitus without complications: Secondary | ICD-10-CM | POA: Insufficient documentation

## 2014-09-13 DIAGNOSIS — N4889 Other specified disorders of penis: Secondary | ICD-10-CM | POA: Diagnosis not present

## 2014-09-13 DIAGNOSIS — I10 Essential (primary) hypertension: Secondary | ICD-10-CM | POA: Insufficient documentation

## 2014-09-13 DIAGNOSIS — Z87891 Personal history of nicotine dependence: Secondary | ICD-10-CM | POA: Diagnosis not present

## 2014-09-13 MED ORDER — BUTENAFINE HCL 1 % EX CREA: TOPICAL_CREAM | CUTANEOUS | Status: DC

## 2014-09-13 MED ORDER — FLUCONAZOLE 100 MG PO TABS
200.0000 mg | ORAL_TABLET | Freq: Once | ORAL | Status: AC
  Administered 2014-09-13: 200 mg via ORAL
  Filled 2014-09-13: qty 2

## 2014-09-13 NOTE — Discharge Instructions (Signed)
You were treated in the emergency department today with Diflucan. Please cleanse the affected area of your penis with soap and water daily. Please use the Lotrimin ultra to the affected area 2 times daily. Please have this reevaluated next week at Dr. Karie Kirks.

## 2014-09-13 NOTE — ED Provider Notes (Signed)
CSN: [id number]     Arrival date & time 09/13/14  1010 History   First MD Initiated Contact with Patient 09/13/14 1024     Chief Complaint  Patient presents with  . Penile Discharge     (Consider location/radiation/quality/duration/timing/severity/associated sxs/prior Treatment) HPI Comments: Patient is a 64 year old male who presents to the emergency department with a complaint of rash on my penis. Patient states that he is a diabetic. He states that from time to time he has a "yeast infection" on his penis. He has been evaluated for this by his primary physician in the past. He states that usually cause all helps. His doctor has been out of the office for the past 2 days, and will not be in the office today due to the weather, the patient states that it is quite irritating rubbing against his underwear and he presents to the emergency department to get some assistance. He has not had any fever, she's not had any discharge from the penis, and has not noticed any significant change in his glucose checks.  Patient is a 64 y.o. male presenting with penile discharge. The history is provided by the patient.  Penile Discharge Pertinent negatives include no abdominal pain, arthralgias, chest pain, coughing or neck pain.    Past Medical History  Diagnosis Date  . Diabetes mellitus   . Hypertension    Past Surgical History  Procedure Laterality Date  . Lung biopsy     No family history on file. History  Substance Use Topics  . Smoking status: Former Research scientist (life sciences)  . Smokeless tobacco: Not on file  . Alcohol Use: No    Review of Systems  Constitutional: Negative for activity change.       All ROS Neg except as noted in HPI  Eyes: Negative for photophobia and discharge.  Respiratory: Negative for cough, shortness of breath and wheezing.   Cardiovascular: Negative for chest pain and palpitations.  Gastrointestinal: Negative for abdominal pain and blood in stool.  Genitourinary: Positive for  penile pain. Negative for dysuria, urgency, frequency, hematuria, scrotal swelling and testicular pain.  Musculoskeletal: Negative for back pain, arthralgias and neck pain.  Skin: Negative.   Neurological: Negative for dizziness, seizures and speech difficulty.  Psychiatric/Behavioral: Negative for hallucinations and confusion.      Allergies  Review of patient's allergies indicates no known allergies.  Home Medications   Prior to Admission medications   Medication Sig Start Date End Date Taking? Authorizing Provider  albuterol (PROVENTIL HFA;VENTOLIN HFA) 108 (90 BASE) MCG/ACT inhaler Inhale 1-2 puffs into the lungs every 6 (six) hours as needed for wheezing or shortness of breath. 03/03/14   Nat Christen, MD  azithromycin (ZITHROMAX Z-PAK) 250 MG tablet 2 po day one, then 1 daily x 4 days 03/03/14   Nat Christen, MD   There were no vitals taken for this visit. Physical Exam  Constitutional: He is oriented to person, place, and time. He appears well-developed and well-nourished.  Non-toxic appearance.  HENT:  Head: Normocephalic.  Right Ear: Tympanic membrane and external ear normal.  Left Ear: Tympanic membrane and external ear normal.  Eyes: EOM and lids are normal. Pupils are equal, round, and reactive to light.  Neck: Normal range of motion. Neck supple. Carotid bruit is not present.  Cardiovascular: Normal rate, regular rhythm, normal heart sounds, intact distal pulses and normal pulses.   Pulmonary/Chest: Breath sounds normal. No respiratory distress.  Abdominal: Soft. Bowel sounds are normal. There is no tenderness. There is no  guarding.  Genitourinary: Penile tenderness present.  Small pustule just behind the head of the penis with minimal redness around the pustule area. No red streaks appreciated. No excessive swelling of the hand or the shaft of the penis. No scrotal involvement. No penile drainage noted.  Musculoskeletal: Normal range of motion.  Lymphadenopathy:       Head  (right side): No submandibular adenopathy present.       Head (left side): No submandibular adenopathy present.    He has no cervical adenopathy.  Neurological: He is alert and oriented to person, place, and time. He has normal strength. No cranial nerve deficit or sensory deficit.  Skin: Skin is warm and dry.  Psychiatric: He has a normal mood and affect. His speech is normal.  Nursing note and vitals reviewed.   ED Course  Procedures (including critical care time) Labs Review Labs Reviewed - No data to display  Imaging Review No results found.   EKG Interpretation None      MDM  Patient has a pustule with minimal redness around the pustule area just behind the head of the penis. Patient states he has had this problem in the past and it was diagnosed as yeast problem related to his diabetes. His been no high fevers reported. No drainage from the penis. No scrotal involvement. The patient will be treated with Diflucan and Lotrimin ultra. Pt to follow up with Dr Karie Kirks next week.   Final diagnoses:  Penis pain  Skin lesion - Penis  *I have reviewed nursing notes, vital signs, and all appropriate lab and imaging results for this patient.[address], PA-C 09/13/14 [place], MD 09/13/14 [phone]

## 2014-09-13 NOTE — ED Notes (Signed)
Pt states he has a yeast infection around his penis. States he thinks it is yeast because he has had this same problem before

## 2015-04-27 ENCOUNTER — Emergency Department (HOSPITAL_COMMUNITY): Payer: Medicare Other

## 2015-04-27 ENCOUNTER — Inpatient Hospital Stay (HOSPITAL_COMMUNITY)
Admission: EM | Admit: 2015-04-27 | Discharge: 2015-04-29 | DRG: 439 | Disposition: A | Discharge status: Home / Self Care | Payer: Medicare Other | Attending: Internal Medicine | Admitting: Internal Medicine

## 2015-04-27 ENCOUNTER — Encounter (HOSPITAL_COMMUNITY): Payer: Self-pay | Admitting: Cardiology

## 2015-04-27 DIAGNOSIS — K219 Gastro-esophageal reflux disease without esophagitis: Secondary | ICD-10-CM | POA: Diagnosis present

## 2015-04-27 DIAGNOSIS — R1010 Upper abdominal pain, unspecified: Secondary | ICD-10-CM | POA: Diagnosis present

## 2015-04-27 DIAGNOSIS — E871 Hypo-osmolality and hyponatremia: Secondary | ICD-10-CM | POA: Diagnosis not present

## 2015-04-27 DIAGNOSIS — Z87891 Personal history of nicotine dependence: Secondary | ICD-10-CM

## 2015-04-27 DIAGNOSIS — F1010 Alcohol abuse, uncomplicated: Secondary | ICD-10-CM | POA: Diagnosis not present

## 2015-04-27 DIAGNOSIS — E860 Dehydration: Secondary | ICD-10-CM | POA: Diagnosis not present

## 2015-04-27 DIAGNOSIS — F10129 Alcohol abuse with intoxication, unspecified: Secondary | ICD-10-CM | POA: Diagnosis not present

## 2015-04-27 DIAGNOSIS — I160 Hypertensive urgency: Secondary | ICD-10-CM | POA: Diagnosis present

## 2015-04-27 DIAGNOSIS — K859 Acute pancreatitis without necrosis or infection, unspecified: Secondary | ICD-10-CM | POA: Diagnosis present

## 2015-04-27 DIAGNOSIS — E119 Type 2 diabetes mellitus without complications: Secondary | ICD-10-CM | POA: Diagnosis present

## 2015-04-27 DIAGNOSIS — Z905 Acquired absence of kidney: Secondary | ICD-10-CM | POA: Diagnosis not present

## 2015-04-27 DIAGNOSIS — E785 Hyperlipidemia, unspecified: Secondary | ICD-10-CM | POA: Diagnosis present

## 2015-04-27 DIAGNOSIS — I10 Essential (primary) hypertension: Secondary | ICD-10-CM | POA: Diagnosis not present

## 2015-04-27 DIAGNOSIS — Y902 Blood alcohol level of 40-59 mg/100 ml: Secondary | ICD-10-CM | POA: Diagnosis not present

## 2015-04-27 DIAGNOSIS — Z85528 Personal history of other malignant neoplasm of kidney: Secondary | ICD-10-CM | POA: Diagnosis not present

## 2015-04-27 DIAGNOSIS — K860 Alcohol-induced chronic pancreatitis: Secondary | ICD-10-CM | POA: Diagnosis present

## 2015-04-27 DIAGNOSIS — R109 Unspecified abdominal pain: Secondary | ICD-10-CM | POA: Diagnosis present

## 2015-04-27 DIAGNOSIS — Z79899 Other long term (current) drug therapy: Secondary | ICD-10-CM

## 2015-04-27 DIAGNOSIS — R1013 Epigastric pain: Secondary | ICD-10-CM | POA: Diagnosis not present

## 2015-04-27 DIAGNOSIS — K852 Alcohol induced acute pancreatitis: Secondary | ICD-10-CM | POA: Diagnosis not present

## 2015-04-27 HISTORY — DX: Pain in right shoulder: M25.511

## 2015-04-27 HISTORY — DX: Headache: R51

## 2015-04-27 HISTORY — DX: Diverticulosis of intestine, part unspecified, without perforation or abscess without bleeding: K57.90

## 2015-04-27 HISTORY — DX: Gastritis, unspecified, without bleeding: K29.70

## 2015-04-27 HISTORY — DX: Dorsalgia, unspecified: M54.9

## 2015-04-27 HISTORY — DX: Other chronic pain: G89.29

## 2015-04-27 HISTORY — DX: Gastro-esophageal reflux disease without esophagitis: K21.9

## 2015-04-27 HISTORY — DX: Headache, unspecified: R51.9

## 2015-04-27 HISTORY — DX: Alcohol abuse, uncomplicated: F10.10

## 2015-04-27 LAB — URINE MICROSCOPIC-ADD ON

## 2015-04-27 LAB — CBC WITH DIFFERENTIAL/PLATELET
Basophils Absolute: 0 10*3/uL (ref 0.0–0.1)
Basophils Relative: 0 % (ref 0–1)
Eosinophils Absolute: 0 10*3/uL (ref 0.0–0.7)
Eosinophils Relative: 0 % (ref 0–5)
HEMATOCRIT: 47 % (ref 39.0–52.0)
Hemoglobin: 17.1 g/dL — ABNORMAL HIGH (ref 13.0–17.0)
LYMPHS ABS: 1.5 10*3/uL (ref 0.7–4.0)
LYMPHS PCT: 11 % — AB (ref 12–46)
MCH: 31.7 pg (ref 26.0–34.0)
MCHC: 36.4 g/dL — AB (ref 30.0–36.0)
MCV: 87 fL (ref 78.0–100.0)
MONO ABS: 0.6 10*3/uL (ref 0.1–1.0)
MONOS PCT: 5 % (ref 3–12)
NEUTROS ABS: 11 10*3/uL — AB (ref 1.7–7.7)
Neutrophils Relative %: 84 % — ABNORMAL HIGH (ref 43–77)
Platelets: 242 10*3/uL (ref 150–400)
RBC: 5.4 MIL/uL (ref 4.22–5.81)
RDW: 14 % (ref 11.5–15.5)
WBC: 13.1 10*3/uL — ABNORMAL HIGH (ref 4.0–10.5)

## 2015-04-27 LAB — URINALYSIS, ROUTINE W REFLEX MICROSCOPIC
Bilirubin Urine: NEGATIVE
Glucose, UA: >1000 mg/dL — AB
KETONES UR: NEGATIVE mg/dL
LEUKOCYTES UA: NEGATIVE
NITRITE: NEGATIVE
Specific Gravity, Urine: 1.015 (ref 1.005–1.030)
UROBILINOGEN UA: 0.2 mg/dL (ref 0.0–1.0)
pH: 5 (ref 5.0–8.0)

## 2015-04-27 LAB — ETHANOL: Alcohol, Ethyl (B): 55 mg/dL — ABNORMAL HIGH (ref <5)

## 2015-04-27 LAB — COMPREHENSIVE METABOLIC PANEL
ALT: 60 U/L (ref 17–63)
ANION GAP: 11 (ref 5–15)
AST: 77 U/L — ABNORMAL HIGH (ref 15–41)
Albumin: 3.5 g/dL (ref 3.5–5.0)
Alkaline Phosphatase: 100 U/L (ref 38–126)
BILIRUBIN TOTAL: 0.6 mg/dL (ref 0.3–1.2)
BUN: 20 mg/dL (ref 6–20)
CO2: 25 mmol/L (ref 22–32)
Calcium: 8.7 mg/dL — ABNORMAL LOW (ref 8.9–10.3)
Chloride: 92 mmol/L — ABNORMAL LOW (ref 101–111)
Creatinine, Ser: 1.21 mg/dL (ref 0.61–1.24)
GFR calc Af Amer: >60 mL/min (ref >60)
Glucose, Bld: 306 mg/dL — ABNORMAL HIGH (ref 65–99)
POTASSIUM: 4.5 mmol/L (ref 3.5–5.1)
Sodium: 128 mmol/L — ABNORMAL LOW (ref 135–145)
TOTAL PROTEIN: 6.6 g/dL (ref 6.5–8.1)

## 2015-04-27 LAB — RAPID URINE DRUG SCREEN, HOSP PERFORMED
AMPHETAMINES: NOT DETECTED
BENZODIAZEPINES: POSITIVE — AB
Barbiturates: NOT DETECTED
Cocaine: NOT DETECTED
OPIATES: NOT DETECTED
Tetrahydrocannabinol: NOT DETECTED

## 2015-04-27 LAB — LACTIC ACID, PLASMA
LACTIC ACID, VENOUS: 3.3 mmol/L — AB (ref 0.5–2.0)
Lactic Acid, Venous: 3.9 mmol/L (ref 0.5–2.0)

## 2015-04-27 LAB — POC OCCULT BLOOD, ED: Fecal Occult Bld: NEGATIVE

## 2015-04-27 LAB — LIPASE, BLOOD: LIPASE: 83 U/L — AB (ref 22–51)

## 2015-04-27 LAB — TROPONIN I: Troponin I: <0.03 ng/mL (ref <0.031)

## 2015-04-27 LAB — MAGNESIUM: Magnesium: 1.9 mg/dL (ref 1.7–2.4)

## 2015-04-27 MED ORDER — LORAZEPAM 1 MG PO TABS: 1.0000 mg | ORAL_TABLET | Freq: Four times a day (QID) | ORAL | Status: DC | PRN

## 2015-04-27 MED ORDER — ACETAMINOPHEN 650 MG RE SUPP: 650.0000 mg | Freq: Four times a day (QID) | RECTAL | Status: DC | PRN

## 2015-04-27 MED ORDER — IOHEXOL 300 MG/ML  SOLN
25.0000 mL | Freq: Once | INTRAMUSCULAR | Status: AC | PRN
  Administered 2015-04-27: 25 mL via ORAL

## 2015-04-27 MED ORDER — THIAMINE HCL 100 MG/ML IJ SOLN
100.0000 mg | Freq: Every day | INTRAMUSCULAR | Status: DC
  Administered 2015-04-28: 100 mg via INTRAVENOUS

## 2015-04-27 MED ORDER — CLONIDINE HCL 0.2 MG/24HR TD PTWK
0.2000 mg | MEDICATED_PATCH | TRANSDERMAL | Status: DC
  Administered 2015-04-28: 0.2 mg via TRANSDERMAL
  Filled 2015-04-27: qty 1

## 2015-04-27 MED ORDER — INSULIN ASPART 100 UNIT/ML ~~LOC~~ SOLN
0.0000 [IU] | SUBCUTANEOUS | Status: DC
Start: 2015-04-28 — End: 2015-04-29
  Administered 2015-04-28: 3 [IU] via SUBCUTANEOUS
  Administered 2015-04-28 (×3): 2 [IU] via SUBCUTANEOUS
  Administered 2015-04-28: 5 [IU] via SUBCUTANEOUS
  Administered 2015-04-29: 2 [IU] via SUBCUTANEOUS
  Administered 2015-04-29: 9 [IU] via SUBCUTANEOUS

## 2015-04-27 MED ORDER — PANTOPRAZOLE SODIUM 40 MG IV SOLR
40.0000 mg | INTRAVENOUS | Status: DC
  Administered 2015-04-28 (×2): 40 mg via INTRAVENOUS
  Filled 2015-04-27 (×3): qty 40

## 2015-04-27 MED ORDER — VITAMIN B-1 100 MG PO TABS
100.0000 mg | ORAL_TABLET | Freq: Every day | ORAL | Status: DC
  Administered 2015-04-29: 100 mg via ORAL
  Filled 2015-04-27: qty 1

## 2015-04-27 MED ORDER — HYDRALAZINE HCL 20 MG/ML IJ SOLN
10.0000 mg | INTRAMUSCULAR | Status: DC | PRN
  Administered 2015-04-28: 10 mg via INTRAVENOUS
  Filled 2015-04-27: qty 1

## 2015-04-27 MED ORDER — ADULT MULTIVITAMIN W/MINERALS CH
1.0000 | ORAL_TABLET | Freq: Every day | ORAL | Status: DC
  Administered 2015-04-28 – 2015-04-29 (×2): 1 via ORAL
  Filled 2015-04-27 (×2): qty 1

## 2015-04-27 MED ORDER — SODIUM CHLORIDE 0.9 % IJ SOLN
3.0000 mL | Freq: Two times a day (BID) | INTRAMUSCULAR | Status: DC
  Administered 2015-04-28 (×2): 3 mL via INTRAVENOUS

## 2015-04-27 MED ORDER — ONDANSETRON HCL 4 MG/2ML IJ SOLN: 4.0000 mg | Freq: Four times a day (QID) | INTRAMUSCULAR | Status: DC | PRN

## 2015-04-27 MED ORDER — MORPHINE SULFATE (PF) 2 MG/ML IV SOLN
2.0000 mg | INTRAVENOUS | Status: AC | PRN
  Administered 2015-04-28: 2 mg via INTRAVENOUS
  Filled 2015-04-27: qty 1

## 2015-04-27 MED ORDER — MORPHINE SULFATE (PF) 4 MG/ML IV SOLN
4.0000 mg | INTRAVENOUS | Status: DC | PRN
  Administered 2015-04-27 (×2): 4 mg via INTRAVENOUS
  Filled 2015-04-27 (×2): qty 1

## 2015-04-27 MED ORDER — ONDANSETRON HCL 4 MG/2ML IJ SOLN
4.0000 mg | INTRAMUSCULAR | Status: DC | PRN
  Administered 2015-04-27: 4 mg via INTRAVENOUS
  Filled 2015-04-27: qty 2

## 2015-04-27 MED ORDER — ONDANSETRON HCL 4 MG PO TABS: 4.0000 mg | ORAL_TABLET | Freq: Four times a day (QID) | ORAL | Status: DC | PRN

## 2015-04-27 MED ORDER — SODIUM CHLORIDE 0.9 % IV BOLUS (SEPSIS)
500.0000 mL | Freq: Once | INTRAVENOUS | Status: AC
  Administered 2015-04-27: 500 mL via INTRAVENOUS

## 2015-04-27 MED ORDER — FOLIC ACID 1 MG PO TABS
1.0000 mg | ORAL_TABLET | Freq: Every day | ORAL | Status: DC
  Administered 2015-04-28 – 2015-04-29 (×2): 1 mg via ORAL
  Filled 2015-04-27 (×2): qty 1

## 2015-04-27 MED ORDER — LORAZEPAM 2 MG/ML IJ SOLN: 0.0000 mg | Freq: Four times a day (QID) | INTRAMUSCULAR | Status: DC

## 2015-04-27 MED ORDER — LORAZEPAM 2 MG/ML IJ SOLN
1.0000 mg | Freq: Once | INTRAMUSCULAR | Status: AC
Start: 2015-04-27 — End: 2015-04-27
  Administered 2015-04-27: 1 mg via INTRAVENOUS
  Filled 2015-04-27: qty 1

## 2015-04-27 MED ORDER — THIAMINE HCL 100 MG/ML IJ SOLN
100.0000 mg | Freq: Every day | INTRAMUSCULAR | Status: DC
  Administered 2015-04-28: 100 mg via INTRAVENOUS
  Filled 2015-04-27 (×2): qty 2

## 2015-04-27 MED ORDER — ENOXAPARIN SODIUM 40 MG/0.4ML ~~LOC~~ SOLN
40.0000 mg | SUBCUTANEOUS | Status: DC
  Administered 2015-04-28 (×2): 40 mg via SUBCUTANEOUS
  Filled 2015-04-27 (×2): qty 0.4

## 2015-04-27 MED ORDER — SODIUM CHLORIDE 0.9 % IV BOLUS (SEPSIS)
1000.0000 mL | Freq: Once | INTRAVENOUS | Status: AC
  Administered 2015-04-27: 1000 mL via INTRAVENOUS

## 2015-04-27 MED ORDER — SODIUM CHLORIDE 0.9 % IV SOLN
INTRAVENOUS | Status: AC
  Administered 2015-04-28 – 2015-04-29 (×5): via INTRAVENOUS

## 2015-04-27 MED ORDER — LORAZEPAM 2 MG/ML IJ SOLN: 1.0000 mg | Freq: Four times a day (QID) | INTRAMUSCULAR | Status: DC | PRN

## 2015-04-27 MED ORDER — ACETAMINOPHEN 325 MG PO TABS: 650.0000 mg | ORAL_TABLET | Freq: Four times a day (QID) | ORAL | Status: DC | PRN

## 2015-04-27 MED ORDER — FAMOTIDINE IN NACL 20-0.9 MG/50ML-% IV SOLN
20.0000 mg | Freq: Once | INTRAVENOUS | Status: AC
Start: 2015-04-27 — End: 2015-04-27
  Administered 2015-04-27: 20 mg via INTRAVENOUS
  Filled 2015-04-27: qty 50

## 2015-04-27 MED ORDER — IOHEXOL 300 MG/ML  SOLN
100.0000 mL | Freq: Once | INTRAMUSCULAR | Status: AC | PRN
  Administered 2015-04-27: 100 mL via INTRAVENOUS

## 2015-04-27 MED ORDER — LORAZEPAM 2 MG/ML IJ SOLN
0.0000 mg | Freq: Two times a day (BID) | INTRAMUSCULAR | Status: DC
  Administered 2015-04-28: 2 mg via INTRAVENOUS
  Filled 2015-04-27: qty 1

## 2015-04-27 NOTE — ED Provider Notes (Signed)
CSN: [id number]     Arrival date & time 04/27/15  1813 History   First MD Initiated Contact with Patient 04/27/15 1828     Chief Complaint  Patient presents with  . Abdominal Pain    HPI  Pt was seen at [place].  Per pt, c/o gradual onset and worsening of persistent upper abd "pain" for the past 2 to 3 weeks, worse since yesterday.  Has been associated with "dark stools."  Describes the abd pain as "dull" and "it might be my pancreatitis again." States he has hx of same "many years ago."  Denies N/V, no diarrhea, no fevers, no back pain, no rash, no CP/SOB, no blood in stools.      Past Medical History  Diagnosis Date  . Diabetes mellitus   . Hypertension   . Right shoulder pain   . Chronic back pain   . GERD (gastroesophageal reflux disease)   . Headache   . Gastritis   . Diverticulosis   . Alcohol abuse    Past Surgical History  Procedure Laterality Date  . Lung biopsy    . Partial nephrectomy Left 2012    Social History  Substance Use Topics  . Smoking status: Former Research scientist (life sciences)  . Smokeless tobacco: None  . Alcohol Use: Yes     Comment: daily    Review of Systems ROS: Statement: All systems negative except as marked or noted in the HPI; Constitutional: Negative for fever and chills. ; ; Eyes: Negative for eye pain, redness and discharge. ; ; ENMT: Negative for ear pain, hoarseness, nasal congestion, sinus pressure and sore throat. ; ; Cardiovascular: Negative for chest pain, palpitations, diaphoresis, dyspnea and peripheral edema. ; ; Respiratory: Negative for cough, wheezing and stridor. ; ; Gastrointestinal: +abd pain. Negative for nausea, vomiting, diarrhea, blood in stool, hematemesis, jaundice and rectal bleeding. . ; ; Genitourinary: Negative for dysuria, flank pain and hematuria. ; ; Musculoskeletal: Negative for back pain and neck pain. Negative for swelling and trauma.; ; Skin: Negative for pruritus, rash, abrasions, blisters, bruising and skin lesion.; ; Neuro: Negative for  headache, lightheadedness and neck stiffness. Negative for weakness, altered level of consciousness , altered mental status, extremity weakness, paresthesias, involuntary movement, seizure and syncope.      Allergies  Review of patient's allergies indicates no known allergies.  Home Medications   Prior to Admission medications   Medication Sig Start Date End Date Taking? Authorizing Provider  albuterol (PROVENTIL HFA;VENTOLIN HFA) 108 (90 BASE) MCG/ACT inhaler Inhale 1-2 puffs into the lungs every 6 (six) hours as needed for wheezing or shortness of breath. 03/03/14   Nat Christen, MD  azithromycin (ZITHROMAX Z-PAK) 250 MG tablet 2 po day one, then 1 daily x 4 days 03/03/14   Nat Christen, MD  Butenafine HCl 1 % cream Apply to affected area two times daily. 09/13/14   Lily Kocher, PA-C   BP 164/119 mmHg  Pulse 122  Temp(Src) 98.8 F (37.1 C) (Oral)  Resp 18  Ht 6' (1.829 m)  Wt 185 lb (83.915 kg)  BMI 25.08 kg/m2  SpO2 100% Physical Exam  1845: Physical examination:  Nursing notes reviewed; Vital signs and O2 SAT reviewed;  Constitutional: Well developed, Well nourished, Well hydrated, In no acute distress; Head:  Normocephalic, atraumatic; Eyes: EOMI, PERRL, No scleral icterus; ENMT: Mouth and pharynx normal, Mucous membranes moist; Neck: Supple, Full range of motion, No lymphadenopathy; Cardiovascular: Regular rate and rhythm, No gallop; Respiratory: Breath sounds clear & equal bilaterally, No  wheezes.  Speaking full sentences with ease, Normal respiratory effort/excursion; Chest: Nontender, Movement normal; Abdomen: Soft, +upper abd tenderness to palp. No rebound or guarding. Nondistended, Normal bowel sounds. Rectal exam performed w/permission of pt and ED RN chaperone present.  Anal tone normal.  Non-tender, soft brown stool in rectal vault, heme neg.  No fissures, no external hemorrhoids, no palp masses.; Genitourinary: No CVA tenderness; Extremities: Pulses normal, No tenderness, No  edema, No calf edema or asymmetry.; Neuro: AA&Ox3, Major CN grossly intact.  Speech clear. No gross focal motor or sensory deficits in extremities.; Skin: Color normal, Warm, Dry.   ED Course  Procedures (including critical care time) Labs Review   Imaging Review  I have personally reviewed and evaluated these images and lab results as part of my medical decision-making.   EKG Interpretation   Date/Time:  Sunday April 27 2015 19:10:43 EDT Ventricular Rate:  99 PR Interval:  132 QRS Duration: 72 QT Interval:  334 QTC Calculation: 429 R Axis:   82 Text Interpretation:  Sinus rhythm Biatrial enlargement Borderline right  axis deviation Nonspecific T abnrm, anterolateral leads Baseline wander  When compared with ECG of 10/29/2010 No significant change was found  Confirmed by [facility]  MD, Nunzio Cory [id number]) on 04/27/2015 7:21:32 PM      MDM  MDM Reviewed: previous chart, nursing note and vitals Reviewed previous: labs and ECG Interpretation: labs, ECG, x-ray and CT scan Total time providing critical care: 30-74 minutes. This excludes time spent performing separately reportable procedures and services. Consults: admitting MD   CRITICAL CARE Performed by: Alfonzo Feller Total critical care time: 35 Critical care time was exclusive of separately billable procedures and treating other patients. Critical care was necessary to treat or prevent imminent or life-threatening deterioration. Critical care was time spent personally by me on the following activities: development of treatment plan with patient and/or surrogate as well as nursing, discussions with consultants, evaluation of patient's response to treatment, examination of patient, obtaining history from patient or surrogate, ordering and performing treatments and interventions, ordering and review of laboratory studies, ordering and review of radiographic studies, pulse oximetry and re-evaluation of patient's  condition.    Results for orders placed or performed during the hospital encounter of 04/27/15  Lactic acid, plasma  Result Value Ref Range   Lactic Acid, Venous 3.9 (HH) 0.5 - 2.0 mmol/L  Urinalysis, Routine w reflex microscopic  Result Value Ref Range   Color, Urine YELLOW YELLOW   APPearance CLEAR CLEAR   Specific Gravity, Urine 1.015 1.005 - 1.030   pH 5.0 5.0 - 8.0   Glucose, UA >1000 (A) NEGATIVE mg/dL   Hgb urine dipstick TRACE (A) NEGATIVE   Bilirubin Urine NEGATIVE NEGATIVE   Ketones, ur NEGATIVE NEGATIVE mg/dL   Protein, ur TRACE (A) NEGATIVE mg/dL   Urobilinogen, UA 0.2 0.0 - 1.0 mg/dL   Nitrite NEGATIVE NEGATIVE   Leukocytes, UA NEGATIVE NEGATIVE  Urine rapid drug screen (hosp performed)  Result Value Ref Range   Opiates NONE DETECTED NONE DETECTED   Cocaine NONE DETECTED NONE DETECTED   Benzodiazepines POSITIVE (A) NONE DETECTED   Amphetamines NONE DETECTED NONE DETECTED   Tetrahydrocannabinol NONE DETECTED NONE DETECTED   Barbiturates NONE DETECTED NONE DETECTED  Urine microscopic-add on  Result Value Ref Range   RBC / HPF 0-2 <3 RBC/hpf  Ethanol  Result Value Ref Range   Alcohol, Ethyl (B) 55 (H) <5 mg/dL  CBC with Differential/Platelet  Result Value Ref Range   WBC 13.1 (  H) 4.0 - 10.5 K/uL   RBC 5.40 4.22 - 5.81 MIL/uL   Hemoglobin 17.1 (H) 13.0 - 17.0 g/dL   HCT 47.0 39.0 - 52.0 %   MCV 87.0 78.0 - 100.0 fL   MCH 31.7 26.0 - 34.0 pg   MCHC 36.4 (H) 30.0 - 36.0 g/dL   RDW 14.0 11.5 - 15.5 %   Platelets 242 150 - 400 K/uL   Neutrophils Relative % 84 (H) 43 - 77 %   Neutro Abs 11.0 (H) 1.7 - 7.7 K/uL   Lymphocytes Relative 11 (L) 12 - 46 %   Lymphs Abs 1.5 0.7 - 4.0 K/uL   Monocytes Relative 5 3 - 12 %   Monocytes Absolute 0.6 0.1 - 1.0 K/uL   Eosinophils Relative 0 0 - 5 %   Eosinophils Absolute 0.0 0.0 - 0.7 K/uL   Basophils Relative 0 0 - 1 %   Basophils Absolute 0.0 0.0 - 0.1 K/uL  Comprehensive metabolic panel  Result Value Ref Range    Sodium 128 (L) 135 - 145 mmol/L   Potassium 4.5 3.5 - 5.1 mmol/L   Chloride 92 (L) 101 - 111 mmol/L   CO2 25 22 - 32 mmol/L   Glucose, Bld 306 (H) 65 - 99 mg/dL   BUN 20 6 - 20 mg/dL   Creatinine, Ser 1.21 0.61 - 1.24 mg/dL   Calcium 8.7 (L) 8.9 - 10.3 mg/dL   Total Protein 6.6 6.5 - 8.1 g/dL   Albumin 3.5 3.5 - 5.0 g/dL   AST 77 (H) 15 - 41 U/L   ALT 60 17 - 63 U/L   Alkaline Phosphatase 100 38 - 126 U/L   Total Bilirubin 0.6 0.3 - 1.2 mg/dL   GFR calc non Af Amer >60 >60 mL/min   GFR calc Af Amer >60 >60 mL/min   Anion gap 11 5 - 15  Lipase, blood  Result Value Ref Range   Lipase 83 (H) 22 - 51 U/L  Troponin I  Result Value Ref Range   Troponin I <0.03 <0.031 ng/mL  POC occult blood, ED  Result Value Ref Range   Fecal Occult Bld NEGATIVE NEGATIVE   Dg Chest 2 View 04/27/2015   CLINICAL DATA:  Upper abdominal pain. History of diabetes, headache, chest tightness  EXAM: CHEST  2 VIEW  COMPARISON:  None.  FINDINGS: The heart size and mediastinal contours are within normal limits. Both lungs are clear. The visualized skeletal structures are unremarkable.  IMPRESSION: No active cardiopulmonary disease.   Electronically Signed   By: Kathreen Devoid   On: 04/27/2015 20:57   Ct Abdomen Pelvis W Contrast 04/27/2015   CLINICAL DATA:  64 year old male with upper abdominal pain. History of renal cell carcinoma.  EXAM: CT ABDOMEN AND PELVIS WITH CONTRAST  TECHNIQUE: Multidetector CT imaging of the abdomen and pelvis was performed using the standard protocol following bolus administration of intravenous contrast.  CONTRAST:  70m OMNIPAQUE IOHEXOL 300 MG/ML SOLN, 1066mOMNIPAQUE IOHEXOL 300 MG/ML SOLN  COMPARISON:  Abdominal MRI dated 06/07/2014 and CT dated 06/05/2013  FINDINGS: A partially visualized 7 mm nodular density along the left fissure (series 2, image 1) may represent a subpleural nodule/scarring and less likely a vessel on end. Left lung base scarring noted. The visualized lung bases are  otherwise clear. No intra-abdominal free air or free fluid noted.  There is inflammatory changes of the pancreas and peripancreatic and compatible with acute pancreatitis. Small scattered coarse calcification of the head and  uncinate process of the pancreas noted sequela of chronic pancreatitis. No drainable fluid collection/abscess or pseudocyst identified.  The liver, gallbladder, spleen, and adrenal glands appear unremarkable. Stable postsurgical changes of the left kidney. There is a 1.2 cm nonenhancing fluid density lesion in the upper pole of the left kidney most compatible with a cyst. The 1.6 cm hypodense/cystic lesion in the posterior medial right kidney corresponds to the cyst seen on the prior MRI. No abnormal enhancement or solid-appearing lesion identified. Subcentimeter right renal hypodense lesions are too small to characterize. There is no hydronephrosis on either side. The visualized ureters and urinary bladder appear unremarkable. The prostate gland measures approximately 5 cm in diameter.  There are scattered colonic diverticula without active inflammation. Moderate stool noted throughout the colon. There is no evidence of bowel obstruction. The appendix is unremarkable.  Mild aortoiliac atherosclerotic disease. The origins of the celiac axis, SMA, IMA, and the origins of the renal arteries are patent. The splenic vein is patent. No portal venous gas identified. There is no lymphadenopathy. Mild degenerative changes of the spine. No acute fracture. A 1.8 cm lucent lesion the no aggressive features is noted in the left iliac bone which was present on the prior study.  IMPRESSION: Acute pancreatitis.  No abscess.  Stable partial left nephrectomy. No solid renal lesion or abnormal enhancement identified.  Colonic diverticulosis with no evidence of bowel obstruction or inflammation.  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  A partially visualized 7 mm nodular density along the left  fissure. Follow-up with nonemergent CT of the chest recommended.   Electronically Signed   By: Anner Crete M.D.   On: 04/27/2015 20:46    2145:   New hyponatremia on labs today. CT scan with acute pancreatitis. IVF bolus given for elevated lactic acid. IV ativan given for HTN due to possible etoh withdrawal. CIWA score pending.  Dx and testing d/w pt.  Questions answered.  Verb understanding, agreeable to admit. T/C to [facility] Dr. Jonnie Finner, case discussed, including:  HPI, pertinent PM/SHx, VS/PE, dx testing, ED course and treatment:  Agreeable to admit, requests he will come to the ED for evaluation.    Francine Graven, DO 04/30/15 1308

## 2015-04-27 NOTE — ED Notes (Signed)
Upper abdominal pain that started today.  Pt states he has had pancreatitis and it feels the same.

## 2015-04-27 NOTE — ED Notes (Signed)
CRITICAL VALUE ALERT  Critical value received: Lactic Acid 3.9  Date of notification:  [id number]  Time of notification:  2038  Critical value read back:Yes.    Nurse who received alert:  JRM  MD notified (1st page):  EDP  Time of first page:  2038  MD notified (2nd page):  Time of second page:  Responding MD:  EDP  Time MD responded:  2038

## 2015-04-27 NOTE — H&P (Addendum)
[facility] History and Physical  Colin Wagner OVZ:[id number] DOB: October 12, 1950 DOA: 04/27/2015  Referring physician: Dr Thurnell Garbe  [facility] PCP: Atarah Cadogan Bellow, MD   Chief Complaint: Abd pain, anorexia  HPI: Colin Wagner is a 64 y.o. male with hx etoh abuse, tobacco use, DM2 , HTN who presented to [facility] with   2 day history of worsening upper abd pain.  Has hx of pancreatitis and says this feels the same.  Partner is with him and says he has had no appetite the last few days. Denies any vomiting, hematemesis, diarrhea , fevers, chills or sweats.  No CP or SOB.  Drinks etoh, " a few beers", he is vague with details of etoh and tobacco use.  Says he went through withdrawal from etoh "years ago".    Was here in 2009 with GI bleeding, lower tract with recently normal colonoscopy . Bleeding abated on it's own, no further investigation.    Was here in 2012 with left robotic-assit lap partial nephrectomy for renal mass. Path showed RCC, papillary type with clean margins.    ROS  no HA, sore throat  no confusion   no join tpain or skin rash  Where does patient live home Can patient participate in ADLs? yes  Past Medical History  Past Medical History  Diagnosis Date  . Diabetes mellitus   . Hypertension   . Right shoulder pain   . Chronic back pain   . GERD (gastroesophageal reflux disease)   . Headache   . Gastritis   . Diverticulosis   . Alcohol abuse    Past Surgical History  Past Surgical History  Procedure Laterality Date  . Lung biopsy    . Partial nephrectomy Left 2012   Family History History reviewed. No pertinent family history.  Social History  reports that he has quit smoking. He does not have any smokeless tobacco history on file. He reports that he drinks alcohol. He reports that he does not use illicit drugs. Allergies No Known Allergies Home medications Prior to Admission medications   Medication Sig Start Date End Date Taking? Authorizing Provider   albuterol (PROVENTIL HFA;VENTOLIN HFA) 108 (90 BASE) MCG/ACT inhaler Inhale 1-2 puffs into the lungs every 6 (six) hours as needed for wheezing or shortness of breath. 03/03/14  Yes Nat Christen, MD  atorvastatin (LIPITOR) 80 MG tablet TAKE ONE TABLET EACH DAY FOR HIGH CHOLESTEROL. 02/14/15  Yes Historical Provider, MD  cetirizine (ZYRTEC) 10 MG tablet Take 10 mg by mouth daily. 03/31/15  Yes Historical Provider, MD  HYDROcodone-acetaminophen (NORCO) 7.5-325 MG per tablet Take 1 tablet by mouth 3 (three) times daily as needed. For back, hip, and/or knee pain 03/30/15  Yes Historical Provider, MD  lisinopril (PRINIVIL,ZESTRIL) 5 MG tablet Take 5 mg by mouth daily. 04/02/15  Yes Historical Provider, MD  LORazepam (ATIVAN) 1 MG tablet Take 0.5-1 tablets by mouth 3 (three) times daily as needed. For anxiety 03/31/15  Yes Historical Provider, MD  metFORMIN (GLUCOPHAGE-XR) 500 MG 24 hr tablet Take 500-1,000 mg by mouth 3 (three) times daily. 2 tabs in the morning, then takes one at lunch and at dinner. 04/02/15  Yes Historical Provider, MD  omeprazole (PRILOSEC) 20 MG capsule Take 20 mg by mouth 2 (two) times daily. 03/31/15  Yes Historical Provider, MD  testosterone cypionate (DEPOTESTOSTERONE CYPIONATE) 200 MG/ML injection INJECT INTRAMUSCULARLY ONCE A WEEK FOR LOW TESTOSTERONE 03/03/15  Yes Historical Provider, MD   Liver Function Tests  Recent Labs Lab 04/27/15 1943  AST 77*  ALT 60  ALKPHOS 100  BILITOT 0.6  PROT 6.6  ALBUMIN 3.5    Recent Labs Lab 04/27/15 1943  LIPASE 83*   CBC  Recent Labs Lab 04/27/15 1943  WBC 13.1*  NEUTROABS 11.0*  HGB 17.1*  HCT 47.0  MCV 87.0  PLT 311   Basic Metabolic Panel  Recent Labs Lab 04/27/15 1943  NA 128*  K 4.5  CL 92*  CO2 25  GLUCOSE 306*  BUN 20  CREATININE 1.21  CALCIUM 8.7*    EKG: Independently reviewed > NSR 86 bpm, no acute changes  Filed Vitals:   04/27/15 1825 04/27/15 1900 04/27/15 1908 04/27/15 1933  BP: 164/119 172/108  180/114 194/122  Pulse: 122 99 100 91  Temp: 98.8 F (37.1 C)     TempSrc: Oral     Resp: 18   24  Height: 6' (1.829 m)     Weight: 83.915 kg (185 lb)     SpO2: 100% 95% 97% 97%   Exam: Alert, mumbled speech, etoh on breath No rash, cyanosis or gangrene Sclera anicteric, throat clear and dry No jvd or bruits Chest clear bilat  RRR no MRG Abd slightly protuberant, tympanitic, soft, minimal epig tenderness, no ascites or hsm GU normal male Ext no joint effusion/ deformity, no edema / wounds/ ulcers Neuro is alert, Ox 3, no asterixis, mumbled speech  Na 148  K 4.5  CO2 25  BUN 20  Creat 1.21  eGFR >60  Ca 8.7   Glu 306 Alb 3.5  Lipase 83  AST 77  ALT 60  TBili 0.6  Trop < 0.03 Lactic acid 3.9^   WBC 13k  Hb 17   Plt 242 CXR no acute disease   CT abd / pelvis 9/4 >  1. ...inflammatory changes of the pancreas and peripancreatic and compatible with acute pancreatitis. Small scattered coarse calcification of the head and uncinate process of the pancreas noted sequela of chronic pancreatitis. No drainable fluid collection/abscess or pseudocyst identified 2. S/p partial left nephrectomy 3. Diverticulosis    Assessment: 1. Abd pain / hx chronic pancreatitis - findings clinically c/w acute pancreatitis, low lipase due to chronic pancreatitis condition, CT + for inflammatory changes. Prob due to persistent etoh abuse.  CT no pseudocyst/ abcess, chronic calcific changes. Plan admit, IVF's, npo, PPI, pain meds. 2. ETOH abuse/ intoxication - CIWA protocol, moderate to high risk for withdrawal  3. HTN on lisinopril, BP's up here 4. DM 2 on metformin 5. HL on statin 6. Hx left partial nephrectomy for RCC in 2012  Plan - IV meds for BP, pain control. Catapres patch. SQ insulin SSI,  hold po meds, NPO. CIWA protocol. SDU. Labs in am.    DVT Prophylaxis - use lovenox Code Status: full  Family Communication: wife at bedside  Disposition Plan: home when better    [facility]  D [facility] Pager [phone]  If 7PM-7AM, please contact night-coverage [email or web address] Password [email or web address] 04/27/2015, 9:40 PM

## 2015-04-27 NOTE — ED Notes (Addendum)
CRITICAL VALUE ALERT  Critical value received: Lactic Acid 3.3  Date of notification:  04/27/15  Time of notification:  2040  Critical value read back:Yes.    Nurse who received alert:  Lucy Antigua, RN  MD notified (1st page):  Dr. Jonnie Finner  Time MD responded:  2241

## 2015-04-28 ENCOUNTER — Encounter (HOSPITAL_COMMUNITY): Payer: Self-pay | Admitting: *Deleted

## 2015-04-28 DIAGNOSIS — K860 Alcohol-induced chronic pancreatitis: Secondary | ICD-10-CM | POA: Diagnosis not present

## 2015-04-28 DIAGNOSIS — F1010 Alcohol abuse, uncomplicated: Secondary | ICD-10-CM | POA: Diagnosis not present

## 2015-04-28 DIAGNOSIS — R1010 Upper abdominal pain, unspecified: Secondary | ICD-10-CM | POA: Diagnosis not present

## 2015-04-28 DIAGNOSIS — K852 Alcohol induced acute pancreatitis: Principal | ICD-10-CM

## 2015-04-28 DIAGNOSIS — E871 Hypo-osmolality and hyponatremia: Secondary | ICD-10-CM

## 2015-04-28 LAB — CBC
HEMATOCRIT: 46.6 % (ref 39.0–52.0)
HEMOGLOBIN: 16.4 g/dL (ref 13.0–17.0)
MCH: 30.5 pg (ref 26.0–34.0)
MCHC: 35.2 g/dL (ref 30.0–36.0)
MCV: 86.6 fL (ref 78.0–100.0)
Platelets: 266 10*3/uL (ref 150–400)
RBC: 5.38 MIL/uL (ref 4.22–5.81)
RDW: 14.2 % (ref 11.5–15.5)
WBC: 14 10*3/uL — AB (ref 4.0–10.5)

## 2015-04-28 LAB — GLUCOSE, CAPILLARY
GLUCOSE-CAPILLARY: 155 mg/dL — AB (ref 65–99)
GLUCOSE-CAPILLARY: 162 mg/dL — AB (ref 65–99)
GLUCOSE-CAPILLARY: 183 mg/dL — AB (ref 65–99)
GLUCOSE-CAPILLARY: 219 mg/dL — AB (ref 65–99)
GLUCOSE-CAPILLARY: 281 mg/dL — AB (ref 65–99)
Glucose-Capillary: 194 mg/dL — ABNORMAL HIGH (ref 65–99)

## 2015-04-28 LAB — BASIC METABOLIC PANEL
ANION GAP: 10 (ref 5–15)
BUN: 18 mg/dL (ref 6–20)
CHLORIDE: 97 mmol/L — AB (ref 101–111)
CO2: 24 mmol/L (ref 22–32)
Calcium: 8.3 mg/dL — ABNORMAL LOW (ref 8.9–10.3)
Creatinine, Ser: 1.14 mg/dL (ref 0.61–1.24)
GFR calc Af Amer: >60 mL/min (ref >60)
GLUCOSE: 163 mg/dL — AB (ref 65–99)
POTASSIUM: 4 mmol/L (ref 3.5–5.1)
SODIUM: 131 mmol/L — AB (ref 135–145)

## 2015-04-28 LAB — MRSA PCR SCREENING: MRSA BY PCR: NEGATIVE

## 2015-04-28 LAB — LACTIC ACID, PLASMA: LACTIC ACID, VENOUS: 1.1 mmol/L (ref 0.5–2.0)

## 2015-04-28 MED ORDER — MORPHINE SULFATE (PF) 2 MG/ML IV SOLN
2.0000 mg | INTRAVENOUS | Status: DC | PRN
  Administered 2015-04-28: 2 mg via INTRAVENOUS
  Filled 2015-04-28: qty 1

## 2015-04-28 MED ORDER — CALCIUM CARBONATE ANTACID 500 MG PO CHEW
1.0000 | CHEWABLE_TABLET | Freq: Three times a day (TID) | ORAL | Status: AC
  Administered 2015-04-28 – 2015-04-29 (×3): 200 mg via ORAL
  Filled 2015-04-28 (×3): qty 1

## 2015-04-28 MED ORDER — INFLUENZA VAC SPLIT QUAD 0.5 ML IM SUSY
0.5000 mL | PREFILLED_SYRINGE | INTRAMUSCULAR | Status: DC
  Filled 2015-04-28: qty 0.5

## 2015-04-28 NOTE — Progress Notes (Signed)
PROGRESS NOTE  Colin Wagner UDJ:[id number] DOB: [birth year] DOA: 04/27/2015 PCP: Robert Bellow, MD  Summary: 41yom PMH EtOH abuse, pancreatitis who presented with increasing upper abd pain in context of alcohol use.  Assessment/Plan: 1. Acute pancreatitis (confirmed with CT), epigastric abd pain. Likely secondary to alcohol abuse. Suspect chronic pancreatitis. No complicating features on CT. 2. Alcohol abuse, intoxication on admission. CIWA. Monitor for withdrawal.  3. Elevated lactic acid, likely dehydration, alcohol use. 4. Hyponatremia, improved, secondary to dehydration 5. DM type 2, hold metformin while inpatient. CBG stable, AG 10, fasting glucose 163. 6. Alcohol use, suspect abuse.  7. S/p partial left nephrectomy for RCC 2012 8. 71m nodule left lung fissure. F/u CT as an outpatient.    Ongoing epigastric pain. Continue NPO, fluids, pain and nausea medications.  Repeat lactic acid, no evidence of sepsis.  CMP, lipase in AM.  Will transfer to medical bed  Code Status: full code DVT prophylaxis: Lovenox Family Communication: Discussed with patient who understands and has no concerns at this time. Disposition Plan: home within 2-3 days  DMurray Hodgkins MD  [facility]  Pager [phone] 7PM-7AM, please contact night-coverage at [email or web address], password [email or web address]9/12/2014, 8:45 AM  LOS: 1 day   Consultants:    Procedures:    Antibiotics:    HPI/Subjective: Abdominal pain at epigastrum with nausea. No vomiting. Denies SOB. Drinks "1/2 bottle" a day.  Objective: Filed Vitals:   04/28/15 0600 04/28/15 0700 04/28/15 0726 04/28/15 0800  BP: 138/118 121/70  151/89  Pulse: 107 101  100  Temp:   98.2 F (36.8 C)   TempSrc:   Oral   Resp: '22 24  20  '$ Height:      Weight:      SpO2: 97% 98%  98%    Intake/Output Summary (Last 24 hours) at 04/28/15 0845 Last data filed at 04/28/15 0500  Gross per 24 hour  Intake      0 ml  Output    400 ml  Net    -400 ml     Filed Weights   04/27/15 1825 04/28/15 0000 04/28/15 0502  Weight: 83.915 kg (185 lb) 80.3 kg (177 lb 0.5 oz) 81.2 kg (179 lb 0.2 oz)    Exam:     Afebrile, VSS, no hypoxia  General:  Appears calm Eyes: PERRL, normal lids, irises & conjunctiva ENT: grossly normal hearing, lips & tongue Neck: no LAD, masses or thyromegaly Cardiovascular: RRR, no m/r/g. No lower extremity edema. Telemetry: ST, no arrhythmias  Respiratory: CTA bilaterally, no w/r/r. Normal respiratory effort. Abdomen: Soft, moderate distention. Moderate epigastric tenderness to palpation Skin: Appears unremarkable Musculoskeletal: grossly normal tone BUE/BLE Psychiatric: grossly normal mood and affect, speech fluent and appropriate Neurologic: grossly non-focal.  New data reviewed:  Sodium up to 131  WBC 14.0, otherwise unremarkable  Lactic acid 3.9 >> 3.3  Pertinent data since admission:  CXR: No active cardiopulmonary disease.  CT abd/pelvis: Acute pancreatitis. No abscess.  Serum ethanol 55  EKG SR  Pending data:    Scheduled Meds: . cloNIDine  0.2 mg Transdermal Weekly  . enoxaparin (LOVENOX) injection  40 mg Subcutaneous Q24H  . folic acid  1 mg Oral Daily  . [START ON 04/29/2015] Influenza vac split quadrivalent PF  0.5 mL Intramuscular Tomorrow-1000  . insulin aspart  0-9 Units Subcutaneous 6 times per day  . LORazepam  0-4 mg Intravenous 4 times per day  . LORazepam  0-4 mg Intravenous Q12H  . multivitamin with minerals  1 tablet Oral Daily  . pantoprazole (PROTONIX) IV  40 mg Intravenous Q24H  . sodium chloride  3 mL Intravenous Q12H  . thiamine  100 mg Intravenous Daily  . thiamine  100 mg Oral Daily   Or  . thiamine  100 mg Intravenous Daily   Continuous Infusions: . sodium chloride 75 mL/hr at 04/28/15 0032    Principal Problem:   Acute pancreatitis Active Problems:   Abdominal pain   Chronic alcoholic pancreatitis   ETOH abuse   DM type 2 (diabetes  mellitus, type 2)   Hypertension, uncontrolled   Hyperlipidemia   Hypertensive urgency   Hyponatremia   Time spent 20 minutes  I, Jessica D. Leonie Green, acting as scribe, recorded this note contemporaneously in the presence of Dr. Melene Plan. Sarajane Jews, M.D. on 04/28/2015 .  I have reviewed the above documentation for accuracy and completeness, and I agree with the above. Colin Hodgkins, MD

## 2015-04-28 NOTE — Progress Notes (Signed)
1358 Patient requesting medication for heartburn, MD notified.

## 2015-04-28 NOTE — Care Management Note (Signed)
Case Management Note  Patient Details  Name: Colin Wagner MRN: [id number] Date of Birth: [birth year]  Expected Discharge Date:    05/01/2015              Expected Discharge Plan:  Home/Self Care  In-House Referral:  NA  Discharge planning Services  CM Consult  Post Acute Care Choice:  NA Choice offered to:  NA  DME Arranged:    DME Agency:     HH Arranged:    [place] Agency:     Status of Service:  Completed, signed off  Medicare Important Message Given:    Date Medicare IM Given:    Medicare IM give by:    Date Additional Medicare IM Given:    Additional Medicare Important Message give by:     If discussed at [place] of Stay Meetings, dates discussed:    Additional Comments: Pt admitted with pancreatitis. From home, lives with spouse, pt has no HH services or DME's prior to admission, ind with ADL's. Pt plans to return home with self care at DC. No CM needs anticipated.  Sherald Barge, RN 04/28/2015, 3:05 PM

## 2015-04-29 DIAGNOSIS — R1010 Upper abdominal pain, unspecified: Secondary | ICD-10-CM | POA: Diagnosis not present

## 2015-04-29 DIAGNOSIS — K858 Other acute pancreatitis: Secondary | ICD-10-CM

## 2015-04-29 DIAGNOSIS — E11319 Type 2 diabetes mellitus with unspecified diabetic retinopathy without macular edema: Secondary | ICD-10-CM | POA: Diagnosis not present

## 2015-04-29 DIAGNOSIS — K852 Alcohol induced acute pancreatitis: Secondary | ICD-10-CM | POA: Diagnosis not present

## 2015-04-29 LAB — COMPREHENSIVE METABOLIC PANEL
ALBUMIN: 2.9 g/dL — AB (ref 3.5–5.0)
ALT: 33 U/L (ref 17–63)
AST: 31 U/L (ref 15–41)
Alkaline Phosphatase: 80 U/L (ref 38–126)
Anion gap: 7 (ref 5–15)
BUN: 11 mg/dL (ref 6–20)
CHLORIDE: 103 mmol/L (ref 101–111)
CO2: 23 mmol/L (ref 22–32)
Calcium: 8 mg/dL — ABNORMAL LOW (ref 8.9–10.3)
Creatinine, Ser: 1.1 mg/dL (ref 0.61–1.24)
GFR calc Af Amer: >60 mL/min (ref >60)
GFR calc non Af Amer: >60 mL/min (ref >60)
GLUCOSE: 162 mg/dL — AB (ref 65–99)
POTASSIUM: 3.9 mmol/L (ref 3.5–5.1)
SODIUM: 133 mmol/L — AB (ref 135–145)
Total Bilirubin: 1.1 mg/dL (ref 0.3–1.2)
Total Protein: 5.8 g/dL — ABNORMAL LOW (ref 6.5–8.1)

## 2015-04-29 LAB — GLUCOSE, CAPILLARY
GLUCOSE-CAPILLARY: 163 mg/dL — AB (ref 65–99)
GLUCOSE-CAPILLARY: 164 mg/dL — AB (ref 65–99)
GLUCOSE-CAPILLARY: 361 mg/dL — AB (ref 65–99)
Glucose-Capillary: 196 mg/dL — ABNORMAL HIGH (ref 65–99)

## 2015-04-29 LAB — LIPASE, BLOOD: Lipase: 72 U/L — ABNORMAL HIGH (ref 22–51)

## 2015-04-29 MED ORDER — THIAMINE HCL 100 MG PO TABS: 100.0000 mg | ORAL_TABLET | Freq: Every day | ORAL | Status: DC

## 2015-04-29 MED ORDER — LISINOPRIL 5 MG PO TABS
5.0000 mg | ORAL_TABLET | Freq: Every day | ORAL | Status: DC
  Administered 2015-04-29: 5 mg via ORAL
  Filled 2015-04-29: qty 1

## 2015-04-29 MED ORDER — ATORVASTATIN CALCIUM 40 MG PO TABS: 80.0000 mg | ORAL_TABLET | Freq: Every day | ORAL | Status: DC

## 2015-04-29 MED ORDER — ADULT MULTIVITAMIN W/MINERALS CH: 1.0000 | ORAL_TABLET | Freq: Every day | ORAL | Status: DC

## 2015-04-29 MED ORDER — HYDROCODONE-ACETAMINOPHEN 7.5-325 MG PO TABS: 1.0000 | ORAL_TABLET | Freq: Three times a day (TID) | ORAL | Status: DC | PRN

## 2015-04-29 MED ORDER — OXYCODONE HCL 5 MG PO TABS: 5.0000 mg | ORAL_TABLET | Freq: Four times a day (QID) | ORAL | Status: DC | PRN

## 2015-04-29 NOTE — Progress Notes (Signed)
Patient tolerated clear liq diet well, diet advanced to card mod as ordered, patient eating carb mod lunch tray at this time.

## 2015-04-29 NOTE — Discharge Summary (Signed)
Physician Discharge Summary  Colin Wagner MGQ:[id number] DOB: [birth year] DOA: 04/27/2015  PCP: Robert Bellow, MD  Admit date: 04/27/2015 Discharge date: 04/29/2015  Time spent: 45 minutes  Recommendations for Outpatient Follow-up:  -Will be discharged home today. -Advised to follow-up with primary care provider 2 weeks.   Discharge Diagnoses:  Principal Problem:   Acute pancreatitis Active Problems:   Abdominal pain   Chronic alcoholic pancreatitis   ETOH abuse   DM type 2 (diabetes mellitus, type 2)   Hypertension, uncontrolled   Hyperlipidemia   Hypertensive urgency   Hyponatremia   Discharge Condition: Stable and improved  Filed Weights   04/28/15 0000 04/28/15 0502 04/29/15 0432  Weight: 80.3 kg (177 lb 0.5 oz) 81.2 kg (179 lb 0.2 oz) 83.144 kg (183 lb 4.8 oz)    History of present illness:  As per Dr. Jonnie Finner 04/27/2015: Colin Wagner is a 64 y.o. male with hx etoh abuse, tobacco use, DM2 , HTN who presented to [facility] ED with  2 day history of worsening upper abd pain. Has hx of pancreatitis and says this feels the same. Partner is with him and says he has had no appetite the last few days. Denies any vomiting, hematemesis, diarrhea , fevers, chills or sweats. No CP or SOB. Drinks etoh, " a few beers", he is vague with details of etoh and tobacco use. Says he went through withdrawal from etoh "years ago".    Was here in 2009 with GI bleeding, lower tract with recently normal colonoscopy . Bleeding abated on it's own, no further investigation.   Was here in 2012 with left robotic-assit lap partial nephrectomy for renal mass. Path showed RCC, papillary type with clean margins.   Hospital Course:   Acute pancreatitis -Etiology is alcohol abuse. -Has clinically resolved with improved pain, tolerating solid diet.  Alcohol abuse -With intoxication on admission. -Has not exhibited signs of withdrawal during this hospital stay.  Hyponatremia -Secondary  to alcohol abuse and dehydration, improved with a sodium of 133 upon discharge.  Type 2 diabetes -Fair control  History of renal cell carcinoma in 2012 -Status post partial left nephrectomy.  7 mm nodule of the left long fissure -We'll need CT to follow-up as an outpatient in 3 months.   Procedures:  None   Consultations:  None  Discharge Instructions  Discharge Instructions    Diet - low sodium heart healthy    Complete by:  As directed      Increase activity slowly    Complete by:  As directed             Medication List    TAKE these medications        albuterol 108 (90 BASE) MCG/ACT inhaler  Commonly known as:  PROVENTIL HFA;VENTOLIN HFA  Inhale 1-2 puffs into the lungs every 6 (six) hours as needed for wheezing or shortness of breath.     atorvastatin 80 MG tablet  Commonly known as:  LIPITOR  TAKE ONE TABLET EACH DAY FOR HIGH CHOLESTEROL.     cetirizine 10 MG tablet  Commonly known as:  ZYRTEC  Take 10 mg by mouth daily.     HYDROcodone-acetaminophen 7.5-325 MG per tablet  Commonly known as:  NORCO  Take 1 tablet by mouth 3 (three) times daily as needed. For back, hip, and/or knee pain     lisinopril 5 MG tablet  Commonly known as:  PRINIVIL,ZESTRIL  Take 5 mg by mouth daily.  LORazepam 1 MG tablet  Commonly known as:  ATIVAN  Take 0.5-1 tablets by mouth 3 (three) times daily as needed. For anxiety     metFORMIN 500 MG 24 hr tablet  Commonly known as:  GLUCOPHAGE-XR  Take 500-1,000 mg by mouth 3 (three) times daily. 2 tabs in the morning, then takes one at lunch and at dinner.     multivitamin with minerals Tabs tablet  Take 1 tablet by mouth daily.     omeprazole 20 MG capsule  Commonly known as:  PRILOSEC  Take 20 mg by mouth 2 (two) times daily.     testosterone cypionate 200 MG/ML injection  Commonly known as:  DEPOTESTOSTERONE CYPIONATE  INJECT INTRAMUSCULARLY ONCE A WEEK FOR LOW TESTOSTERONE     thiamine 100 MG tablet  Take 1  tablet (100 mg total) by mouth daily.       No Known Allergies     Follow-up Information    Follow up with Robert Bellow, MD. Schedule an appointment as soon as possible for a visit in 2 weeks.   Specialty:  Family Medicine   Contact information:   [place] [id number] [phone]        The results of significant diagnostics from this hospitalization (including imaging, microbiology, ancillary and laboratory) are listed below for reference.    Significant Diagnostic Studies: Dg Chest 2 View  04/27/2015   CLINICAL DATA:  Upper abdominal pain. History of diabetes, headache, chest tightness  EXAM: CHEST  2 VIEW  COMPARISON:  None.  FINDINGS: The heart size and mediastinal contours are within normal limits. Both lungs are clear. The visualized skeletal structures are unremarkable.  IMPRESSION: No active cardiopulmonary disease.   Electronically Signed   By: Kathreen Devoid   On: 04/27/2015 20:57   Ct Abdomen Pelvis W Contrast  04/27/2015   CLINICAL DATA:  64 year old male with upper abdominal pain. History of renal cell carcinoma.  EXAM: CT ABDOMEN AND PELVIS WITH CONTRAST  TECHNIQUE: Multidetector CT imaging of the abdomen and pelvis was performed using the standard protocol following bolus administration of intravenous contrast.  CONTRAST:  59m OMNIPAQUE IOHEXOL 300 MG/ML SOLN, 1036mOMNIPAQUE IOHEXOL 300 MG/ML SOLN  COMPARISON:  Abdominal MRI dated 06/07/2014 and CT dated 06/05/2013  FINDINGS: A partially visualized 7 mm nodular density along the left fissure (series 2, image 1) may represent a subpleural nodule/scarring and less likely a vessel on end. Left lung base scarring noted. The visualized lung bases are otherwise clear. No intra-abdominal free air or free fluid noted.  There is inflammatory changes of the pancreas and peripancreatic and compatible with acute pancreatitis. Small scattered coarse calcification of the head and uncinate process of the pancreas  noted sequela of chronic pancreatitis. No drainable fluid collection/abscess or pseudocyst identified.  The liver, gallbladder, spleen, and adrenal glands appear unremarkable. Stable postsurgical changes of the left kidney. There is a 1.2 cm nonenhancing fluid density lesion in the upper pole of the left kidney most compatible with a cyst. The 1.6 cm hypodense/cystic lesion in the posterior medial right kidney corresponds to the cyst seen on the prior MRI. No abnormal enhancement or solid-appearing lesion identified. Subcentimeter right renal hypodense lesions are too small to characterize. There is no hydronephrosis on either side. The visualized ureters and urinary bladder appear unremarkable. The prostate gland measures approximately 5 cm in diameter.  There are scattered colonic diverticula without active inflammation. Moderate stool noted throughout the colon. There is no evidence of  bowel obstruction. The appendix is unremarkable.  Mild aortoiliac atherosclerotic disease. The origins of the celiac axis, SMA, IMA, and the origins of the renal arteries are patent. The splenic vein is patent. No portal venous gas identified. There is no lymphadenopathy. Mild degenerative changes of the spine. No acute fracture. A 1.8 cm lucent lesion the no aggressive features is noted in the left iliac bone which was present on the prior study.  IMPRESSION: Acute pancreatitis.  No abscess.  Stable partial left nephrectomy. No solid renal lesion or abnormal enhancement identified.  Colonic diverticulosis with no evidence of bowel obstruction or inflammation.  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  A partially visualized 7 mm nodular density along the left fissure. Follow-up with nonemergent CT of the chest recommended.   Electronically Signed   By: Anner Crete M.D.   On: 04/27/2015 20:46    Microbiology: Recent Results (from the past 240 hour(s))  MRSA PCR Screening     Status: None   Collection Time:  04/27/15 11:50 PM  Result Value Ref Range Status   MRSA by PCR NEGATIVE NEGATIVE Final    Comment:        The GeneXpert MRSA Assay (FDA approved for NASAL specimens only), is one component of a comprehensive MRSA colonization surveillance program. It is not intended to diagnose MRSA infection nor to guide or monitor treatment for MRSA infections.      Labs: Basic Metabolic Panel:  Recent Labs Lab 04/27/15 1943 04/28/15 0522 04/29/15 0527  NA 128* 131* 133*  K 4.5 4.0 3.9  CL 92* 97* 103  CO2 '25 24 23  '$ GLUCOSE 306* 163* 162*  BUN '20 18 11  '$ CREATININE 1.21 1.14 1.10  CALCIUM 8.7* 8.3* 8.0*  MG 1.9  --   --    Liver Function Tests:  Recent Labs Lab 04/27/15 1943 04/29/15 0527  AST 77* 31  ALT 60 33  ALKPHOS 100 80  BILITOT 0.6 1.1  PROT 6.6 5.8*  ALBUMIN 3.5 2.9*    Recent Labs Lab 04/27/15 1943 04/29/15 0527  LIPASE 83* 72*   No results for input(s): AMMONIA in the last 168 hours. CBC:  Recent Labs Lab 04/27/15 1943 04/28/15 0522  WBC 13.1* 14.0*  NEUTROABS 11.0*  --   HGB 17.1* 16.4  HCT 47.0 46.6  MCV 87.0 86.6  PLT 242 266   Cardiac Enzymes:  Recent Labs Lab 04/27/15 1943  TROPONINI <0.03   BNP: BNP (last 3 results) No results for input(s): BNP in the last 8760 hours.  ProBNP (last 3 results) No results for input(s): PROBNP in the last 8760 hours.  CBG:  Recent Labs Lab 04/28/15 1634 04/28/15 2117 04/29/15 0033 04/29/15 0431 04/29/15 0836  GLUCAP 183* 162* 196* 163* 164*       Signed:  Lelon Frohlich  [facility] Pager: [phone] 04/29/2015, 1:07 PM

## 2015-04-29 NOTE — Progress Notes (Signed)
Patient tolerated carb mod, regular texture food well and denies any c/o N&V or ABD pain. MD notified.

## 2015-04-29 NOTE — Progress Notes (Signed)
1405 d/c instructions, paperwork and hard Rxs given to patient and patient's family. IV catheter removed from LEFT hand, intact w/no s/s of infection noted, patient tolerated well w/no c/o pain noted. Telemetry box and wiring removed from patient and central telemetry notified and made aware. Patient assisted to vehicle via w/c by staff.

## 2015-04-29 NOTE — Care Management Note (Signed)
Case Management Note  Patient Details  Name: Colin Wagner MRN: [id number] Date of Birth: Feb 06, 1951  Expected Discharge Date:                  Expected Discharge Plan:  Home/Self Care  In-House Referral:  NA  Discharge planning Services  CM Consult  Post Acute Care Choice:  NA Choice offered to:  NA  DME Arranged:    DME Agency:     HH Arranged:    [place]:     Status of Service:  Completed, signed off  Medicare Important Message Given:    Date Medicare IM Given:    Medicare IM give by:    Date Additional Medicare IM Given:    Additional Medicare Important Message give by:     If discussed at [place] of Stay Meetings, dates discussed:    Additional Comments: Pt discharging home today with self care. No CM needs.  Sherald Barge, RN 04/29/2015, 2:17 PM

## 2015-05-06 ENCOUNTER — Other Ambulatory Visit (HOSPITAL_COMMUNITY): Payer: Self-pay | Admitting: Family Medicine

## 2015-05-06 DIAGNOSIS — C642 Malignant neoplasm of left kidney, except renal pelvis: Secondary | ICD-10-CM

## 2015-05-06 DIAGNOSIS — R911 Solitary pulmonary nodule: Secondary | ICD-10-CM

## 2015-05-08 ENCOUNTER — Ambulatory Visit (HOSPITAL_COMMUNITY)
Admission: RE | Admit: 2015-05-08 | Discharge: 2015-05-08 | Disposition: A | Discharge status: Home / Self Care | Payer: Medicare Other | Source: Ambulatory Visit | Attending: Family Medicine | Admitting: Family Medicine

## 2015-05-08 DIAGNOSIS — C642 Malignant neoplasm of left kidney, except renal pelvis: Secondary | ICD-10-CM

## 2015-05-08 DIAGNOSIS — R911 Solitary pulmonary nodule: Secondary | ICD-10-CM | POA: Insufficient documentation

## 2015-05-08 MED ORDER — IOHEXOL 300 MG/ML  SOLN
75.0000 mL | Freq: Once | INTRAMUSCULAR | Status: AC | PRN
Start: 2015-05-08 — End: 2015-05-08
  Administered 2015-05-08: 75 mL via INTRAVENOUS

## 2015-05-27 ENCOUNTER — Ambulatory Visit (HOSPITAL_COMMUNITY)
Admission: RE | Admit: 2015-05-27 | Discharge: 2015-05-27 | Disposition: A | Discharge status: Home / Self Care | Payer: Medicare Other | Source: Ambulatory Visit | Attending: Family Medicine | Admitting: Family Medicine

## 2015-05-27 ENCOUNTER — Other Ambulatory Visit (HOSPITAL_COMMUNITY): Payer: Self-pay | Admitting: Family Medicine

## 2015-05-27 DIAGNOSIS — C642 Malignant neoplasm of left kidney, except renal pelvis: Secondary | ICD-10-CM

## 2015-05-27 DIAGNOSIS — R05 Cough: Secondary | ICD-10-CM

## 2015-05-27 DIAGNOSIS — R059 Cough, unspecified: Secondary | ICD-10-CM

## 2015-05-27 DIAGNOSIS — R634 Abnormal weight loss: Secondary | ICD-10-CM

## 2015-05-28 ENCOUNTER — Ambulatory Visit (HOSPITAL_COMMUNITY)
Admission: RE | Admit: 2015-05-28 | Discharge: 2015-05-28 | Disposition: A | Discharge status: Home / Self Care | Payer: Medicare Other | Source: Ambulatory Visit | Attending: Family Medicine | Admitting: Family Medicine

## 2015-05-28 DIAGNOSIS — Z85528 Personal history of other malignant neoplasm of kidney: Secondary | ICD-10-CM | POA: Diagnosis not present

## 2015-05-28 DIAGNOSIS — K861 Other chronic pancreatitis: Secondary | ICD-10-CM | POA: Diagnosis not present

## 2015-05-28 DIAGNOSIS — R634 Abnormal weight loss: Secondary | ICD-10-CM | POA: Diagnosis not present

## 2015-05-28 DIAGNOSIS — K5730 Diverticulosis of large intestine without perforation or abscess without bleeding: Secondary | ICD-10-CM | POA: Diagnosis not present

## 2015-05-28 DIAGNOSIS — C642 Malignant neoplasm of left kidney, except renal pelvis: Secondary | ICD-10-CM

## 2015-05-28 DIAGNOSIS — R1010 Upper abdominal pain, unspecified: Secondary | ICD-10-CM | POA: Insufficient documentation

## 2015-05-28 DIAGNOSIS — Z905 Acquired absence of kidney: Secondary | ICD-10-CM | POA: Insufficient documentation

## 2015-05-28 DIAGNOSIS — F5089 Other specified eating disorder: Secondary | ICD-10-CM | POA: Diagnosis not present

## 2015-05-28 DIAGNOSIS — N281 Cyst of kidney, acquired: Secondary | ICD-10-CM | POA: Diagnosis not present

## 2015-05-28 MED ORDER — IOHEXOL 300 MG/ML  SOLN
100.0000 mL | Freq: Once | INTRAMUSCULAR | Status: AC | PRN
  Administered 2015-05-28: 100 mL via INTRAVENOUS

## 2015-06-11 ENCOUNTER — Other Ambulatory Visit (HOSPITAL_COMMUNITY): Payer: Self-pay | Admitting: Urology

## 2015-06-11 ENCOUNTER — Ambulatory Visit (HOSPITAL_COMMUNITY)
Admission: RE | Admit: 2015-06-11 | Discharge: 2015-06-11 | Disposition: A | Discharge status: Home / Self Care | Payer: Medicare Other | Source: Ambulatory Visit | Attending: Urology | Admitting: Urology

## 2015-06-11 DIAGNOSIS — R918 Other nonspecific abnormal finding of lung field: Secondary | ICD-10-CM | POA: Insufficient documentation

## 2015-06-11 DIAGNOSIS — C649 Malignant neoplasm of unspecified kidney, except renal pelvis: Secondary | ICD-10-CM | POA: Diagnosis present

## 2015-06-11 DIAGNOSIS — J9811 Atelectasis: Secondary | ICD-10-CM | POA: Diagnosis not present

## 2015-06-11 DIAGNOSIS — I517 Cardiomegaly: Secondary | ICD-10-CM | POA: Diagnosis not present

## 2015-11-24 ENCOUNTER — Other Ambulatory Visit (HOSPITAL_COMMUNITY): Payer: Self-pay | Admitting: Family Medicine

## 2015-11-24 DIAGNOSIS — Z85528 Personal history of other malignant neoplasm of kidney: Secondary | ICD-10-CM

## 2015-11-24 DIAGNOSIS — R9389 Abnormal findings on diagnostic imaging of other specified body structures: Secondary | ICD-10-CM

## 2015-12-01 ENCOUNTER — Ambulatory Visit (HOSPITAL_COMMUNITY)
Admission: RE | Admit: 2015-12-01 | Discharge: 2015-12-01 | Disposition: A | Discharge status: Home / Self Care | Payer: Medicare Other | Source: Ambulatory Visit | Attending: Family Medicine | Admitting: Family Medicine

## 2015-12-01 DIAGNOSIS — Z85528 Personal history of other malignant neoplasm of kidney: Secondary | ICD-10-CM | POA: Diagnosis present

## 2015-12-01 DIAGNOSIS — R918 Other nonspecific abnormal finding of lung field: Secondary | ICD-10-CM | POA: Diagnosis present

## 2015-12-01 DIAGNOSIS — R911 Solitary pulmonary nodule: Secondary | ICD-10-CM | POA: Insufficient documentation

## 2015-12-01 DIAGNOSIS — R9389 Abnormal findings on diagnostic imaging of other specified body structures: Secondary | ICD-10-CM

## 2015-12-01 LAB — POCT I-STAT CREATININE: Creatinine, Ser: 1.3 mg/dL — ABNORMAL HIGH (ref 0.61–1.24)

## 2015-12-01 MED ORDER — IOPAMIDOL (ISOVUE-300) INJECTION 61%
75.0000 mL | Freq: Once | INTRAVENOUS | Status: AC | PRN
  Administered 2015-12-01: 75 mL via INTRAVENOUS

## 2016-06-16 ENCOUNTER — Other Ambulatory Visit (HOSPITAL_COMMUNITY): Payer: Self-pay | Admitting: Urology

## 2016-06-16 ENCOUNTER — Ambulatory Visit (HOSPITAL_COMMUNITY)
Admission: RE | Admit: 2016-06-16 | Discharge: 2016-06-16 | Disposition: A | Discharge status: Home / Self Care | Payer: Medicare Other | Source: Ambulatory Visit | Attending: Urology | Admitting: Urology

## 2016-06-16 DIAGNOSIS — C642 Malignant neoplasm of left kidney, except renal pelvis: Secondary | ICD-10-CM | POA: Insufficient documentation

## 2016-06-16 DIAGNOSIS — J9811 Atelectasis: Secondary | ICD-10-CM | POA: Diagnosis not present

## 2016-06-16 DIAGNOSIS — I517 Cardiomegaly: Secondary | ICD-10-CM | POA: Diagnosis not present

## 2016-08-11 ENCOUNTER — Telehealth: Payer: Self-pay

## 2016-08-18 NOTE — Telephone Encounter (Signed)
Gastroenterology Pre-Procedure Review  Request Date: 08/11/2016 Requesting Physician: Dr. Karie Kirks  PATIENT REVIEW QUESTIONS: The patient responded to the following health history questions as indicated:    1. Diabetes Melitis: YES 2. Joint replacements in the past 12 months: no 3. Major health problems in the past 3 months: no 4. Has an artificial valve or MVP: no 5. Has a defibrillator: no 6. Has been advised in past to take antibiotics in advance of a procedure like teeth cleaning: no 7. Family history of colon cancer: no  8. Alcohol Use: no 9. History of sleep apnea: no  10. History of coronary artery or other vascular stents placed within the last 12 months: no    MEDICATIONS & ALLERGIES:    Patient reports the following regarding taking any blood thinners:   Plavix? no Aspirin? no Coumadin? no Brilinta? no Xarelto? no Eliquis? no Pradaxa? no Savaysa? no Effient? no  Patient confirms/reports the following medications:  Current Outpatient Prescriptions  Medication Sig Dispense Refill  . albuterol (PROVENTIL HFA;VENTOLIN HFA) 108 (90 BASE) MCG/ACT inhaler Inhale 1-2 puffs into the lungs every 6 (six) hours as needed for wheezing or shortness of breath. 1 Inhaler 0  . atorvastatin (LIPITOR) 80 MG tablet TAKE ONE TABLET EACH DAY FOR HIGH CHOLESTEROL.  11  . cetirizine (ZYRTEC) 10 MG tablet Take 10 mg by mouth daily.  11  . glipiZIDE (GLUCOTROL) 5 MG tablet Take 5 mg by mouth daily before breakfast. PT said he takes it as needed ( if BS gets up to 200 he said)    . HYDROcodone-acetaminophen (NORCO) 7.5-325 MG per tablet Take 1 tablet by mouth 3 (three) times daily as needed. For back, hip, and/or knee pain 20 tablet 0  . lisinopril (PRINIVIL,ZESTRIL) 5 MG tablet Take 5 mg by mouth daily. Takes one tablet a day 2-3 times a week  3  . LORazepam (ATIVAN) 1 MG tablet Take 0.5-1 tablets by mouth 3 (three) times daily as needed (only takes one tablet daily). For anxiety  5  .  metFORMIN (GLUCOPHAGE-XR) 500 MG 24 hr tablet Take 500-1,000 mg by mouth 3 (three) times daily. 2 tabs in the morning, then takes one at lunch and at dinner.  11  . Multiple Vitamin (MULTIVITAMIN WITH MINERALS) TABS tablet Take 1 tablet by mouth daily.    Marland Kitchen omeprazole (PRILOSEC) 20 MG capsule Take 20 mg by mouth 2 (two) times daily.  11  . sildenafil (REVATIO) 20 MG tablet Take 20 mg by mouth 3 (three) times daily. As directed    . tamsulosin (FLOMAX) 0.4 MG CAPS capsule Take 0.4 mg by mouth daily.    Marland Kitchen thiamine 100 MG tablet Take 1 tablet (100 mg total) by mouth daily. 30 tablet   . testosterone cypionate (DEPOTESTOSTERONE CYPIONATE) 200 MG/ML injection every 2 weeks  5   No current facility-administered medications for this visit.     Patient confirms/reports the following allergies:  No Known Allergies  No orders of the defined types were placed in this encounter.   AUTHORIZATION INFORMATION Primary Insurance:   ID #:  Group #:  Pre-Cert / Auth required:  Pre-Cert / Auth #:   Secondary Insurance:   ID #:   Group #:  Pre-Cert / Auth required:  Pre-Cert / Auth #:   SCHEDULE INFORMATION: Procedure has been scheduled as follows:  Date:                 Time:   Location:   This Gastroenterology Pre-Precedure  Review Form is being routed to the following provider(s): R. Garfield Cornea, MD

## 2016-08-18 NOTE — Telephone Encounter (Signed)
His PMH has etoh abuse and daily etoh use but patient denies ongoing etoh use. Hospitalized in 2016 with etoh pancreatitis. Also on narcotic and ativan. Suggest deep sedation in OR so patient will need OV first.

## 2016-08-19 NOTE — Telephone Encounter (Signed)
I called pt and scheduled an OV with Neil Crouch, PA on 09/10/2016 at 10:00 Am.  Pt said he has had a little rectal bleed a couple of times since he was triaged. He will let me know if he has much prior to appt on 09/11/2015.

## 2016-09-10 ENCOUNTER — Ambulatory Visit: Payer: Medicare Other | Admitting: Gastroenterology

## 2016-09-11 ENCOUNTER — Encounter (HOSPITAL_COMMUNITY): Payer: Self-pay | Admitting: Emergency Medicine

## 2016-09-11 ENCOUNTER — Emergency Department (HOSPITAL_COMMUNITY): Payer: Medicare Other

## 2016-09-11 ENCOUNTER — Emergency Department (HOSPITAL_COMMUNITY)
Admission: EM | Admit: 2016-09-11 | Discharge: 2016-09-11 | Disposition: A | Discharge status: Home / Self Care | Payer: Medicare Other | Attending: Emergency Medicine | Admitting: Emergency Medicine

## 2016-09-11 DIAGNOSIS — Z7984 Long term (current) use of oral hypoglycemic drugs: Secondary | ICD-10-CM | POA: Diagnosis not present

## 2016-09-11 DIAGNOSIS — E119 Type 2 diabetes mellitus without complications: Secondary | ICD-10-CM | POA: Insufficient documentation

## 2016-09-11 DIAGNOSIS — M436 Torticollis: Secondary | ICD-10-CM | POA: Diagnosis not present

## 2016-09-11 DIAGNOSIS — F17200 Nicotine dependence, unspecified, uncomplicated: Secondary | ICD-10-CM | POA: Diagnosis not present

## 2016-09-11 DIAGNOSIS — I10 Essential (primary) hypertension: Secondary | ICD-10-CM | POA: Diagnosis not present

## 2016-09-11 DIAGNOSIS — Z79899 Other long term (current) drug therapy: Secondary | ICD-10-CM | POA: Insufficient documentation

## 2016-09-11 DIAGNOSIS — M542 Cervicalgia: Secondary | ICD-10-CM | POA: Diagnosis present

## 2016-09-11 MED ORDER — CYCLOBENZAPRINE HCL 10 MG PO TABS: 10.0000 mg | ORAL_TABLET | Freq: Three times a day (TID) | ORAL | 0 refills | Status: DC

## 2016-09-11 NOTE — ED Triage Notes (Signed)
Pt reports left side neck pain x 2 weeks. Pt denies any injury. Pt able to rotate/ extend/ and flex neck. Pain worse with right side rotation.

## 2016-09-11 NOTE — ED Provider Notes (Signed)
[facility] DEPT Provider Note   CSN: [id number] Arrival date & time: 09/11/16  1253     History   Chief Complaint Chief Complaint  Patient presents with  . Torticollis    HPI SHAIL URBAS is a 66 y.o. male.  Patient is a 66 year old male who presents to the emergency department with complaint of left-sided neck pain.  The patient states that over the last 2 weeks he's been having pain involving the left side of his neck. He states that the pain starts behind his left ear goes down to his shoulder blade and sometimes over toward his mid upper shoulder. The pain is aggravated by certain movement. He gets some relief with rest, but it does not completely resolve the issue. The patient denies any recent injury or trauma to the shoulder. He denies any excessive lifting, pushing, or pulling. He has not had any known cervical spine injury. He has not had any operations or procedures involving his cervical spine or his shoulder area. He denies any difficulty with use of his upper extremities. He presents now for assistance with this problem, as he states that conservative measures at home but have not helped.   The history is provided by the patient.    Past Medical History:  Diagnosis Date  . Alcohol abuse   . Chronic back pain   . Diabetes mellitus   . Diverticulosis   . Gastritis   . GERD (gastroesophageal reflux disease)   . Headache   . Hypertension   . Right shoulder pain     Patient Active Problem List   Diagnosis Date Noted  . Hyponatremia 04/28/2015  . Abdominal pain 04/27/2015  . Acute pancreatitis 04/27/2015  . Chronic alcoholic pancreatitis ([place]) 04/27/2015  . ETOH abuse 04/27/2015  . DM type 2 (diabetes mellitus, type 2) ([place]) 04/27/2015  . Hypertension, uncontrolled 04/27/2015  . Hyperlipidemia 04/27/2015  . Hypertensive urgency 04/27/2015  . Alcohol abuse     Past Surgical History:  Procedure Laterality Date  . LUNG BIOPSY    . PARTIAL NEPHRECTOMY  Left 2012       Home Medications    Prior to Admission medications   Medication Sig Start Date End Date Taking? Authorizing Provider  albuterol (PROVENTIL HFA;VENTOLIN HFA) 108 (90 BASE) MCG/ACT inhaler Inhale 1-2 puffs into the lungs every 6 (six) hours as needed for wheezing or shortness of breath. 03/03/14  Yes Nat Christen, MD  atorvastatin (LIPITOR) 80 MG tablet TAKE ONE TABLET EACH DAY FOR HIGH CHOLESTEROL. 02/14/15  Yes Historical Provider, MD  cetirizine (ZYRTEC) 10 MG tablet Take 10 mg by mouth daily. 03/31/15  Yes Historical Provider, MD  glipiZIDE (GLUCOTROL) 5 MG tablet Take 5 mg by mouth daily before breakfast. PT said he takes it as needed ( if BS gets up to 200 he said)   Yes Historical Provider, MD  HYDROcodone-acetaminophen (NORCO) 7.5-325 MG per tablet Take 1 tablet by mouth 3 (three) times daily as needed. For back, hip, and/or knee pain 04/29/15  Yes Estela Leonie Green, MD  lisinopril (PRINIVIL,ZESTRIL) 5 MG tablet Take 5 mg by mouth daily. Takes one tablet a day 2-3 times a week 04/02/15  Yes Historical Provider, MD  LORazepam (ATIVAN) 1 MG tablet Take 0.5-1 tablets by mouth 3 (three) times daily as needed (only takes one tablet daily). For anxiety 03/31/15  Yes Historical Provider, MD  metFORMIN (GLUCOPHAGE-XR) 500 MG 24 hr tablet Take 500-1,000 mg by mouth 3 (three) times daily. 2 tabs in  the morning, then takes one at lunch and at dinner. 04/02/15  Yes Historical Provider, MD  Multiple Vitamin (MULTIVITAMIN WITH MINERALS) TABS tablet Take 1 tablet by mouth daily. 04/29/15  Yes Erline Hau, MD  omeprazole (PRILOSEC) 20 MG capsule Take 20 mg by mouth 2 (two) times daily. 03/31/15  Yes Historical Provider, MD  sildenafil (REVATIO) 20 MG tablet Take 20 mg by mouth 3 (three) times daily. As directed   Yes Historical Provider, MD  tamsulosin (FLOMAX) 0.4 MG CAPS capsule Take 0.4 mg by mouth daily.   Yes Historical Provider, MD  testosterone cypionate (DEPOTESTOSTERONE  CYPIONATE) 200 MG/ML injection every 2 weeks 03/03/15  Yes Historical Provider, MD  thiamine 100 MG tablet Take 1 tablet (100 mg total) by mouth daily. 04/29/15  Yes Estela Leonie Green, MD    Family History No family history on file.  Social History Social History  Substance Use Topics  . Smoking status: Current Some Day Smoker  . Smokeless tobacco: Not on file  . Alcohol use Yes     Comment: daily     Allergies   Patient has no known allergies.   Review of Systems Review of Systems  Constitutional: Negative for activity change.       All ROS Neg except as noted in HPI  HENT: Negative for nosebleeds.   Eyes: Negative for photophobia and discharge.  Respiratory: Negative for cough, shortness of breath and wheezing.   Cardiovascular: Negative for chest pain and palpitations.  Gastrointestinal: Negative for abdominal pain and blood in stool.  Genitourinary: Negative for dysuria, frequency and hematuria.  Musculoskeletal: Positive for back pain and neck pain. Negative for arthralgias.  Skin: Negative.   Neurological: Positive for headaches. Negative for dizziness, seizures and speech difficulty.  Psychiatric/Behavioral: Negative for confusion and hallucinations.     Physical Exam Updated Vital Signs BP 159/82 (BP Location: Left Arm)   Pulse 94   Temp 98.2 F (36.8 C) (Oral)   Resp 18   Ht 6' (1.829 m)   Wt 81.6 kg   SpO2 96%   BMI 24.41 kg/m   Physical Exam  Constitutional: He is oriented to person, place, and time. He appears well-developed and well-nourished.  Non-toxic appearance.  HENT:  Head: Normocephalic.  Right Ear: Tympanic membrane and external ear normal.  Left Ear: Tympanic membrane and external ear normal.  Eyes: EOM and lids are normal. Pupils are equal, round, and reactive to light.  Neck: Normal range of motion. Neck supple. Carotid bruit is not present.  Cardiovascular: Normal rate, regular rhythm, normal heart sounds, intact distal pulses  and normal pulses.   Pulmonary/Chest: Breath sounds normal. No respiratory distress.  Abdominal: Soft. Bowel sounds are normal. There is no tenderness. There is no guarding.  Musculoskeletal: Normal range of motion.       Left shoulder: He exhibits tenderness and spasm.       Cervical back: He exhibits spasm.       Back:       Arms: Lymphadenopathy:       Head (right side): No submandibular adenopathy present.       Head (left side): No submandibular adenopathy present.    He has no cervical adenopathy.  Neurological: He is alert and oriented to person, place, and time. He has normal strength. No cranial nerve deficit or sensory deficit.  Grip is symmetrical. Motor strength is symmetrical of the upper extremities. There no gross neurologic deficits appreciated. Gait is intact. Balance is  intact.  Skin: Skin is warm and dry.  Psychiatric: He has a normal mood and affect. His speech is normal.  Nursing note and vitals reviewed.    ED Treatments / Results  Labs (all labs ordered are listed, but only abnormal results are displayed) Labs Reviewed - No data to display  EKG  EKG Interpretation None       Radiology No results found.  Procedures Procedures (including critical care time)  Medications Ordered in ED Medications - No data to display   Initial Impression / Assessment and Plan / ED Course  I have reviewed the triage vital signs and the nursing notes.  Pertinent labs & imaging results that were available during my care of the patient were reviewed by me and considered in my medical decision making (see chart for details).     *I have reviewed nursing notes, vital signs, and all appropriate lab and imaging results for this patient.**  Final Clinical Impressions(s) / ED Diagnoses  The blood pressure is elevated at 159/82, otherwise the vital signs within normal limits. I find no gross neurologic deficits appreciated. I can reproduce the pain with certain movement  of the shoulder, as well as certain movement of the neck. The patient has tightness and tenseness of the upper trapezius. The examination is consistent with torticollis. Patient will be treated with Flexeril and Tylenol. I've asked him to use a heating pad to his neck and shoulder, or to use warm tub soaks. He will follow-up with orthopedics, Dr. Aline Brochure or return to the emergency department if not improving. Patient is in agreement with this plan.    Final diagnoses:  Torticollis    New Prescriptions New Prescriptions   No medications on file     Lily Kocher, PA-C 09/11/16 [place], MD 09/11/16 2248

## 2016-09-11 NOTE — Discharge Instructions (Signed)
Your blood pressure is slightly elevated at 159/82, otherwise your vital signs are within normal limits. Your examination is consistent with torticollis. This is a condition of all muscle strain with in the neck. Please use of heating pad to your left neck and shoulder area. If heating pad is not available, please use warm tub soaks 2 times daily for about 10-15 minutes. Use Flexeril 3 times daily to help with the muscle spasm and muscle strain and pain. This medication may cause drowsiness. Please do not drink alcohol, drive a vehicle, operating machinery, or participate in activities requiring concentration when taking this medication. Please take your time in changing positions such as sitting and standing when taking this medication. Please see Dr. Karie Kirks for additional evaluation next week. Please return to the emergency department if any emergent changes, problems, or concerns.

## 2016-09-20 ENCOUNTER — Encounter: Payer: Self-pay | Admitting: Orthopedic Surgery

## 2016-09-20 ENCOUNTER — Ambulatory Visit (INDEPENDENT_AMBULATORY_CARE_PROVIDER_SITE_OTHER): Payer: Medicare Other | Admitting: Orthopedic Surgery

## 2016-09-20 VITALS — BP 143/99 | HR 105 | Wt 189.0 lb

## 2016-09-20 DIAGNOSIS — M542 Cervicalgia: Secondary | ICD-10-CM

## 2016-09-20 NOTE — Patient Instructions (Signed)
Dr. Karie Kirks to call me after Dr. Karie Kirks evaluates the patient tomorrow for possible therapy

## 2016-09-20 NOTE — Progress Notes (Signed)
Patient ID: Colin Wagner, male   DOB: June 29, 1951, 66 y.o.   MRN: [id number]  Chief Complaint  Patient presents with  . Neck Pain    HPI Colin Wagner is a 66 y.o. male.  Presents for 2 to three-week history of left-sided and right-sided neck pain radiating into his head along the mastoid sinuses. No fever chills or sinus congestion. No neck weakness numbness or tingling in his hand. He went to the emergency room after ibuprofen and hydrocodone tone did not help his pain is placed on Flexeril and that has not helped either  Review of Systems Review of Systems  Respiratory: Negative for shortness of breath.   Cardiovascular: Negative for chest pain.  Neurological: Negative for numbness.     Past Medical History:  Diagnosis Date  . Alcohol abuse   . Chronic back pain   . Diabetes mellitus   . Diverticulosis   . Gastritis   . GERD (gastroesophageal reflux disease)   . Headache   . Hypertension   . Right shoulder pain     Past Surgical History:  Procedure Laterality Date  . LUNG BIOPSY    . PARTIAL NEPHRECTOMY Left 2012    Social History Social History  Substance Use Topics  . Smoking status: Current Some Day Smoker  . Smokeless tobacco: Never Used  . Alcohol use Yes     Comment: daily    No Known Allergies  Current Meds  Medication Sig  . albuterol (PROVENTIL HFA;VENTOLIN HFA) 108 (90 BASE) MCG/ACT inhaler Inhale 1-2 puffs into the lungs every 6 (six) hours as needed for wheezing or shortness of breath.  Marland Kitchen atorvastatin (LIPITOR) 80 MG tablet TAKE ONE TABLET EACH DAY FOR HIGH CHOLESTEROL.  Marland Kitchen cetirizine (ZYRTEC) 10 MG tablet Take 10 mg by mouth daily.  . cyclobenzaprine (FLEXERIL) 10 MG tablet Take 1 tablet (10 mg total) by mouth 3 (three) times daily.  Marland Kitchen glipiZIDE (GLUCOTROL) 5 MG tablet Take 5 mg by mouth daily before breakfast. PT said he takes it as needed ( if BS gets up to 200 he said)  . HYDROcodone-acetaminophen (NORCO) 7.5-325 MG per tablet Take 1 tablet  by mouth 3 (three) times daily as needed. For back, hip, and/or knee pain  . lisinopril (PRINIVIL,ZESTRIL) 5 MG tablet Take 5 mg by mouth daily. Takes one tablet a day 2-3 times a week  . LORazepam (ATIVAN) 1 MG tablet Take 0.5-1 tablets by mouth 3 (three) times daily as needed (only takes one tablet daily). For anxiety  . metFORMIN (GLUCOPHAGE-XR) 500 MG 24 hr tablet Take 500-1,000 mg by mouth 3 (three) times daily. 2 tabs in the morning, then takes one at lunch and at dinner.  . Multiple Vitamin (MULTIVITAMIN WITH MINERALS) TABS tablet Take 1 tablet by mouth daily.  Marland Kitchen omeprazole (PRILOSEC) 20 MG capsule Take 20 mg by mouth 2 (two) times daily.  . sildenafil (REVATIO) 20 MG tablet Take 20 mg by mouth 3 (three) times daily. As directed  . tamsulosin (FLOMAX) 0.4 MG CAPS capsule Take 0.4 mg by mouth daily.  Marland Kitchen testosterone cypionate (DEPOTESTOSTERONE CYPIONATE) 200 MG/ML injection every 2 weeks  . thiamine 100 MG tablet Take 1 tablet (100 mg total) by mouth daily.      Physical Exam Physical Exam BP (!) 143/99   Pulse (!) 105   Wt 189 lb (85.7 kg)   BMI 25.63 kg/m   Gen. appearance. The patient is well-developed and well-nourished, grooming and hygiene are normal. There are no  gross congenital abnormalities  The patient is alert and oriented to person place and time  Mood and affect are normal  Ambulation Normal ambulation  Exam reveals tenderness in the splenius musculature bilaterally with painful turning in the same region but no tenderness in the neck no Spurling sign both upper extremities have normal strength range of motion stability alignment skin in sensation with normal pulses and reflexes  Data Reviewed The x-ray show cervical spondylosis  Assessment    Cervical spondylosis with muscular neck pain I do not feel this is causing his neck discomfort    Plan    He will see primary care tomorrow and if Dr. Karie Kirks doesn't seem anything related to sinus problem I  recommend physical therapy and ibuprofen along with muscle relaxers.       Arther Abbott 09/20/2016, 2:09 PM

## 2016-09-25 ENCOUNTER — Emergency Department (HOSPITAL_COMMUNITY): Payer: Medicare Other

## 2016-09-25 ENCOUNTER — Emergency Department (HOSPITAL_COMMUNITY)
Admission: EM | Admit: 2016-09-25 | Discharge: 2016-09-25 | Disposition: A | Discharge status: Home / Self Care | Payer: Medicare Other | Attending: Emergency Medicine | Admitting: Emergency Medicine

## 2016-09-25 ENCOUNTER — Encounter (HOSPITAL_COMMUNITY): Payer: Self-pay | Admitting: Emergency Medicine

## 2016-09-25 DIAGNOSIS — F17200 Nicotine dependence, unspecified, uncomplicated: Secondary | ICD-10-CM | POA: Diagnosis not present

## 2016-09-25 DIAGNOSIS — J449 Chronic obstructive pulmonary disease, unspecified: Secondary | ICD-10-CM | POA: Diagnosis not present

## 2016-09-25 DIAGNOSIS — E119 Type 2 diabetes mellitus without complications: Secondary | ICD-10-CM | POA: Insufficient documentation

## 2016-09-25 DIAGNOSIS — R05 Cough: Secondary | ICD-10-CM | POA: Diagnosis present

## 2016-09-25 DIAGNOSIS — Z79899 Other long term (current) drug therapy: Secondary | ICD-10-CM | POA: Diagnosis not present

## 2016-09-25 DIAGNOSIS — I10 Essential (primary) hypertension: Secondary | ICD-10-CM | POA: Diagnosis not present

## 2016-09-25 DIAGNOSIS — J40 Bronchitis, not specified as acute or chronic: Secondary | ICD-10-CM

## 2016-09-25 MED ORDER — ALBUTEROL SULFATE HFA 108 (90 BASE) MCG/ACT IN AERS: 1.0000 | INHALATION_SPRAY | Freq: Four times a day (QID) | RESPIRATORY_TRACT | 0 refills | Status: DC | PRN

## 2016-09-25 MED ORDER — PREDNISONE 10 MG PO TABS: 10.0000 mg | ORAL_TABLET | Freq: Two times a day (BID) | ORAL | 0 refills | Status: DC

## 2016-09-25 NOTE — Discharge Instructions (Signed)
Chest x-ray showed no pneumonia. Continue your antibiotic. Prescription for prednisone and inhaler. Take over-the-counter cough syrup such as Mucinex.

## 2016-09-25 NOTE — ED Triage Notes (Signed)
Pt reports congested non-productive cough x1 week with chills/fever.  Denies pain, n/v/d.

## 2016-09-25 NOTE — ED Provider Notes (Signed)
[facility] DEPT Provider Note   CSN: [id number] Arrival date & time: 09/25/16  5176  By signing my name below, I, Higinio Plan, attest that this documentation has been prepared under the direction and in the presence of Nat Christen, MD . Electronically Signed: Higinio Plan, Scribe. 09/25/2016. 12:19 PM.  History   Chief Complaint Chief Complaint  Patient presents with  . Cough   The history is provided by the patient. No language interpreter was used.   HPI Comments: Colin Wagner is a 66 y.o. male with PMHx of DM, HTN, and alcohol abuse, who presents to the Emergency Department complaining of gradually worsening, intermittent, productive cough and congestion that began ~1.5 weeks ago. Pt reports associated shortness of breath, chills and subjective  fever. He states he has taken DayQuil, Levaquin, and Ibuprofen for his symptoms without relief. He denies any pain, difficulty ambulating, decreased appetite, nausea, vomiting, and diarrhea. He notes he smokes 1-2 cigarettes every day.   Past Medical History:  Diagnosis Date  . Alcohol abuse   . Chronic back pain   . Diabetes mellitus   . Diverticulosis   . Gastritis   . GERD (gastroesophageal reflux disease)   . Headache   . Hypertension   . Right shoulder pain     Patient Active Problem List   Diagnosis Date Noted  . Hyponatremia 04/28/2015  . Abdominal pain 04/27/2015  . Acute pancreatitis 04/27/2015  . Chronic alcoholic pancreatitis ([place]) 04/27/2015  . ETOH abuse 04/27/2015  . DM type 2 (diabetes mellitus, type 2) ([place]) 04/27/2015  . Hypertension, uncontrolled 04/27/2015  . Hyperlipidemia 04/27/2015  . Hypertensive urgency 04/27/2015  . Alcohol abuse     Past Surgical History:  Procedure Laterality Date  . LUNG BIOPSY    . PARTIAL NEPHRECTOMY Left 2012    Home Medications    Prior to Admission medications   Medication Sig Start Date End Date Taking? Authorizing Provider  atorvastatin (LIPITOR) 80 MG tablet TAKE  ONE TABLET EACH DAY FOR HIGH CHOLESTEROL. 02/14/15  Yes Historical Provider, MD  cetirizine (ZYRTEC) 10 MG tablet Take 10 mg by mouth daily. 03/31/15  Yes Historical Provider, MD  cyclobenzaprine (FLEXERIL) 10 MG tablet Take 1 tablet (10 mg total) by mouth 3 (three) times daily. 09/11/16  Yes Lily Kocher, PA-C  glipiZIDE (GLUCOTROL) 5 MG tablet Take 5 mg by mouth daily before breakfast. PT said he takes it as needed ( if BS gets up to 200 he said)   Yes Historical Provider, MD  HYDROcodone-acetaminophen (NORCO) 7.5-325 MG per tablet Take 1 tablet by mouth 3 (three) times daily as needed. For back, hip, and/or knee pain 04/29/15  Yes Estela Leonie Green, MD  levofloxacin (LEVAQUIN) 500 MG tablet Take 1 tablet by mouth daily. 09/21/16  Yes Historical Provider, MD  lisinopril (PRINIVIL,ZESTRIL) 5 MG tablet Take 5 mg by mouth daily. Takes one tablet a day 2-3 times a week 04/02/15  Yes Historical Provider, MD  LORazepam (ATIVAN) 1 MG tablet Take 0.5-1 tablets by mouth 3 (three) times daily as needed (only takes one tablet daily). For anxiety 03/31/15  Yes Historical Provider, MD  metFORMIN (GLUCOPHAGE-XR) 500 MG 24 hr tablet Take 500-1,000 mg by mouth 3 (three) times daily. 2 tabs in the morning, then takes one at lunch and at dinner. 04/02/15  Yes Historical Provider, MD  Multiple Vitamin (MULTIVITAMIN WITH MINERALS) TABS tablet Take 1 tablet by mouth daily. 04/29/15  Yes Estela Leonie Green, MD  omeprazole (PRILOSEC) 20 MG  capsule Take 20 mg by mouth 2 (two) times daily. 03/31/15  Yes Historical Provider, MD  sildenafil (REVATIO) 20 MG tablet Take 20 mg by mouth 3 (three) times daily. As directed   Yes Historical Provider, MD  tamsulosin (FLOMAX) 0.4 MG CAPS capsule Take 0.4 mg by mouth daily.   Yes Historical Provider, MD  testosterone cypionate (DEPOTESTOSTERONE CYPIONATE) 200 MG/ML injection every 2 weeks 03/03/15  Yes Historical Provider, MD  thiamine 100 MG tablet Take 1 tablet (100 mg total) by mouth  daily. 04/29/15  Yes Estela Leonie Green, MD  albuterol (PROVENTIL HFA;VENTOLIN HFA) 108 (90 Base) MCG/ACT inhaler Inhale 1-2 puffs into the lungs every 6 (six) hours as needed for wheezing or shortness of breath. 09/25/16   Nat Christen, MD  predniSONE (DELTASONE) 10 MG tablet Take 1 tablet (10 mg total) by mouth 2 (two) times daily with a meal. 09/25/16   Nat Christen, MD    Family History Family History  Problem Relation Age of Onset  . Hypertension Mother     Social History Social History  Substance Use Topics  . Smoking status: Current Some Day Smoker  . Smokeless tobacco: Never Used  . Alcohol use No     Comment: daily   Allergies   Patient has no known allergies.   Review of Systems Review of Systems  Constitutional: Positive for chills and fever.  HENT: Positive for congestion.   Respiratory: Positive for cough and shortness of breath.   Gastrointestinal: Negative for diarrhea, nausea and vomiting.  All other systems reviewed and are negative.  Physical Exam Updated Vital Signs BP (!) 166/108 (BP Location: Left Arm)   Pulse 108   Temp 97.6 F (36.4 C)   Resp 24   Ht 6' (1.829 m)   Wt 189 lb (85.7 kg)   SpO2 93%   BMI 25.63 kg/m   Physical Exam  Constitutional: He is oriented to person, place, and time. He appears well-developed and well-nourished.  HENT:  Head: Normocephalic and atraumatic.  Eyes: Conjunctivae are normal.  Neck: Neck supple.  Cardiovascular: Normal rate and regular rhythm.   Pulmonary/Chest: Effort normal and breath sounds normal.  Abdominal: Soft. Bowel sounds are normal.  Musculoskeletal: Normal range of motion.  Neurological: He is alert and oriented to person, place, and time.  Skin: Skin is warm and dry.  Psychiatric: He has a normal mood and affect. His behavior is normal.  Nursing note and vitals reviewed.  ED Treatments / Results  DIAGNOSTIC STUDIES:  Oxygen Saturation is 93% on RA, adequate by my interpretation.     COORDINATION OF CARE:  12:16 PM Discussed treatment plan with pt at bedside and pt agreed to plan. Will obtain CXR and prescribe prednisone, inhaler, and cough syrup.   Labs (all labs ordered are listed, but only abnormal results are displayed) Labs Reviewed - No data to display  EKG  EKG Interpretation None       Radiology Dg Chest 2 View  Result Date: 09/25/2016 CLINICAL DATA:  Nonproductive cough. EXAM: CHEST  2 VIEW COMPARISON:  Radiographs of June 16, 2016. FINDINGS: Stable cardiomediastinal silhouette. Both lungs are clear. No pneumothorax or pleural effusion is noted. The visualized skeletal structures are unremarkable. IMPRESSION: No active cardiopulmonary disease. Electronically Signed   By: Marijo Conception, M.D.   On: 09/25/2016 13:19    Procedures Procedures (including critical care time)  Medications Ordered in ED Medications - No data to display  Initial Impression / Assessment and Plan /  ED Course  I have reviewed the triage vital signs and the nursing notes.  Pertinent labs & imaging results that were available during my care of the patient were reviewed by me and considered in my medical decision making (see chart for details).    I personally performed the services described in this documentation, which was scribed in my presence. The recorded information has been reviewed and is accurate.   Patient is in no acute distress. Chest x-ray negative. He is currently on Levaquin. Will prescribe prednisone and an albuterol inhaler.    Final Clinical Impressions(s) / ED Diagnoses   Final diagnoses:  Bronchitis    New Prescriptions New Prescriptions   ALBUTEROL (PROVENTIL HFA;VENTOLIN HFA) 108 (90 BASE) MCG/ACT INHALER    Inhale 1-2 puffs into the lungs every 6 (six) hours as needed for wheezing or shortness of breath.   PREDNISONE (DELTASONE) 10 MG TABLET    Take 1 tablet (10 mg total) by mouth 2 (two) times daily with a meal.     Nat Christen,  MD 09/25/16 1352

## 2016-09-28 ENCOUNTER — Encounter: Payer: Self-pay | Admitting: Gastroenterology

## 2016-09-28 ENCOUNTER — Other Ambulatory Visit: Payer: Self-pay

## 2016-09-28 ENCOUNTER — Ambulatory Visit (INDEPENDENT_AMBULATORY_CARE_PROVIDER_SITE_OTHER): Payer: Medicare Other | Admitting: Gastroenterology

## 2016-09-28 ENCOUNTER — Telehealth: Payer: Self-pay

## 2016-09-28 DIAGNOSIS — K625 Hemorrhage of anus and rectum: Secondary | ICD-10-CM | POA: Insufficient documentation

## 2016-09-28 MED ORDER — PEG 3350-KCL-NA BICARB-NACL 420 G PO SOLR: 4000.0000 mL | ORAL | 0 refills | Status: DC

## 2016-09-28 NOTE — Patient Instructions (Signed)
1. Colonoscopy as scheduled. Please see separate instructions. 

## 2016-09-28 NOTE — Telephone Encounter (Signed)
Called and informed pt of pre-op appt 10/18/16 at 11:00am. Letter also mailed.

## 2016-09-28 NOTE — Progress Notes (Signed)
Primary Care Physician:  Robert Bellow, MD  Primary Gastroenterologist:  Garfield Cornea, MD   Chief Complaint  Patient presents with  . Colonoscopy    HPI:  Colin Wagner is a 66 y.o. male here at the request of PCP for colonoscopy. Patient's last colonoscopy was in 2008 by Dr. [name]. He had diverticulosis. Patient reports recent brbpr that lasted less than a day. No constipation/diarrhea. No melena, no abdominal pain. Good appetite. No heartburn. No dysphagia. Weight is stable.   He is on chronic pain medication and has h/o remote etoh abuse.   Current Outpatient Prescriptions  Medication Sig Dispense Refill  . albuterol (PROVENTIL HFA;VENTOLIN HFA) 108 (90 Base) MCG/ACT inhaler Inhale 1-2 puffs into the lungs every 6 (six) hours as needed for wheezing or shortness of breath. 1 Inhaler 0  . cetirizine (ZYRTEC) 10 MG tablet Take 10 mg by mouth daily.  11  . cyclobenzaprine (FLEXERIL) 10 MG tablet Take 1 tablet (10 mg total) by mouth 3 (three) times daily. 20 tablet 0  . glipiZIDE (GLUCOTROL) 5 MG tablet Take 5 mg by mouth daily before breakfast. PT said he takes it as needed ( if BS gets up to 200 he said)    . HYDROcodone-acetaminophen (NORCO) 7.5-325 MG per tablet Take 1 tablet by mouth 3 (three) times daily as needed. For back, hip, and/or knee pain 20 tablet 0  . lisinopril (PRINIVIL,ZESTRIL) 5 MG tablet Take 5 mg by mouth daily. Takes one tablet a day 2-3 times a week  3  . LORazepam (ATIVAN) 1 MG tablet Take 0.5-1 tablets by mouth 3 (three) times daily as needed (only takes one tablet daily). For anxiety  5  . metFORMIN (GLUCOPHAGE-XR) 500 MG 24 hr tablet Take 500-1,000 mg by mouth 3 (three) times daily. 2 tabs in the morning, then takes one at lunch and at dinner.  11  . Multiple Vitamin (MULTIVITAMIN WITH MINERALS) TABS tablet Take 1 tablet by mouth daily.    Marland Kitchen omeprazole (PRILOSEC) 20 MG capsule Take 20 mg by mouth 2 (two) times daily.  11  . sildenafil (REVATIO) 20 MG  tablet Take 20 mg by mouth 3 (three) times daily. As directed    . tamsulosin (FLOMAX) 0.4 MG CAPS capsule Take 0.4 mg by mouth daily.    Marland Kitchen testosterone cypionate (DEPOTESTOSTERONE CYPIONATE) 200 MG/ML injection every 2 weeks  5  . thiamine 100 MG tablet Take 1 tablet (100 mg total) by mouth daily. 30 tablet    No current facility-administered medications for this visit.     Allergies as of 09/28/2016  . (No Known Allergies)    Past Medical History:  Diagnosis Date  . Alcohol abuse   . Alcoholic pancreatitis 1610   admission  . Chronic back pain   . Chronic pancreatitis ([place])    based on ct findings 2016  . COPD (chronic obstructive pulmonary disease) ([place])   . Diabetes mellitus   . Diverticulosis   . Gastritis   . GERD (gastroesophageal reflux disease)   . Headache   . Hyperlipidemia   . Hypertension   . Peptic ulcer disease 1999   Per medical reports, no H pylori  . Renal cancer, left ([place]) 2012  . Right shoulder pain   . Testicular hypofunction     Past Surgical History:  Procedure Laterality Date  . COLONOSCOPY  2003   Dr. [name], polyps  . COLONOSCOPY  2005   Dr. [name], multiple diverticula  . COLONOSCOPY  2008   Dr. [name], diverticulosis  . ESOPHAGOGASTRODUODENOSCOPY     Multiple EGDs. 1999 EGD showed gastric ulcers, no H pylori and benign biopsies performed by Dr. [name]. 2001 gastric ulcer healed. Last EGD 2005 had gastritis.  Marland Kitchen LUNG BIOPSY    . PARTIAL NEPHRECTOMY Left 2012    Family History  Problem Relation Age of Onset  . Hypertension Mother   . Colon cancer Neg Hx     Social History   Social History  . Marital status: Single    Spouse name: N/A  . Number of children: N/A  . Years of education: N/A   Occupational History  . Not on file.   Social History Main Topics  . Smoking status: Current Some Day Smoker  . Smokeless tobacco: Never Used  . Alcohol use No     Comment: quit 2000 but relapse in 2016. no etoh since  hospitalized 2016.   . Drug use: No  . Sexual activity: Not on file   Other Topics Concern  . Not on file   Social History Narrative  . No narrative on file      ROS:  General: Negative for anorexia, weight loss, fever, chills, fatigue, weakness. Eyes: Negative for vision changes.  ENT: Negative for hoarseness, difficulty swallowing , nasal congestion. CV: Negative for chest pain, angina, palpitations, dyspnea on exertion, peripheral edema.  Respiratory: Negative for dyspnea at rest, dyspnea on exertion, cough, sputum, wheezing.  GI: See history of present illness. GU:  Negative for dysuria, hematuria, urinary incontinence, urinary frequency, nocturnal urination.  MS: Negative for joint pain, +chronic low back pain.  Derm: Negative for rash or itching.  Neuro: Negative for weakness, abnormal sensation, seizure, frequent headaches, memory loss, confusion.  Psych: Negative for anxiety, depression, suicidal ideation, hallucinations.  Endo: Negative for unusual weight change.  Heme: Negative for bruising or bleeding. Allergy: Negative for rash or hives.    Physical Examination:  BP (!) 177/96   Pulse (!) 102   Temp 97.2 F (36.2 C) (Oral)   Ht 6' (1.829 m)   Wt 182 lb 3.2 oz (82.6 kg)   BMI 24.71 kg/m    General: Well-nourished, well-developed in no acute distress.  Head: Normocephalic, atraumatic.   Eyes: Conjunctiva pink, no icterus. Mouth: Oropharyngeal mucosa moist and pink , no lesions erythema or exudate. Neck: Supple without thyromegaly, masses, or lymphadenopathy.  Lungs: Clear to auscultation bilaterally.  Heart: Regular rate and rhythm, no murmurs rubs or gallops.  Abdomen: Bowel sounds are normal, nontender, nondistended, no hepatosplenomegaly or masses, no abdominal bruits or    hernia , no rebound or guarding.   Rectal: deferred Extremities: No lower extremity edema. No clubbing or deformities.  Neuro: Alert and oriented x 4 , grossly normal neurologically.   Skin: Warm and dry, no rash or jaundice.   Psych: Alert and cooperative, normal mood and affect.   Imaging Studies: Dg Chest 2 View  Result Date: 09/25/2016 CLINICAL DATA:  Nonproductive cough. EXAM: CHEST  2 VIEW COMPARISON:  Radiographs of June 16, 2016. FINDINGS: Stable cardiomediastinal silhouette. Both lungs are clear. No pneumothorax or pleural effusion is noted. The visualized skeletal structures are unremarkable. IMPRESSION: No active cardiopulmonary disease. Electronically Signed   By: Marijo Conception, M.D.   On: 09/25/2016 13:19   Dg Cervical Spine Complete  Result Date: 09/11/2016 CLINICAL DATA:  Worsening left-sided neck pain today. No known injury. EXAM: CERVICAL SPINE - COMPLETE 4+ VIEW COMPARISON:  None. FINDINGS: There is no  evidence of cervical spine fracture or prevertebral soft tissue swelling. Alignment is normal. Anterior syndesmophyte formation seen without disc space narrowing, consistent with DISH. No evidence of facet arthropathy or other bone lesions. Bilateral carotid bulb atherosclerotic calcification noted. IMPRESSION: No acute findings. Electronically Signed   By: Earle Gell M.D.   On: 09/11/2016 17:07

## 2016-09-28 NOTE — Assessment & Plan Note (Addendum)
66 y/o male with remote etoh abuse, chronic pain medication use who presents for colonoscopy. Last TCS 10 years ago. Patient had recent small volume hematochezia. Plan for diagnostic colonoscopy in the near future with deep sedation with Dr. [name].  I have discussed the risks, alternatives, benefits with regards to but not limited to the risk of reaction to medication, bleeding, infection, perforation and the patient is agreeable to proceed. Written consent to be obtained.  Review of medical records reveals patient has CT documented chronic pancreatitis in 2016. Patient denies chronic abdominal pain, diarrhea, weight loss.

## 2016-09-28 NOTE — Progress Notes (Signed)
cc'ed to pcp °

## 2016-10-15 NOTE — Patient Instructions (Signed)
Your procedure is scheduled on: 10/21/2016  Report to [facility] at  7:00   AM.  Call this number if you have problems the morning of surgery: [phone]   Remember:   Do not drink or eat food:After Midnight.  :  Take these medicines the morning of surgery with A SIP OF WATER: Zyrtec, Lisinopril, Omeprazole and revatio             Use albuterol inhaler   Do not wear jewelry, make-up or nail polish.  Do not wear lotions, powders, or perfumes. You may wear deodorant.  Do not shave 48 hours prior to surgery. Men may shave face and neck.  Do not bring valuables to the hospital.  Contacts, dentures or bridgework may not be worn into surgery.  Leave suitcase in the car. After surgery it may be brought to your room.  For patients admitted to the hospital, checkout time is 11:00 AM the day of discharge.   Patients discharged the day of surgery will not be allowed to drive home.    Colonoscopy, Adult, Care After This sheet gives you information about how to care for yourself after your procedure. Your health care provider may also give you more specific instructions. If you have problems or questions, contact your health care provider. What can I expect after the procedure? After the procedure, it is common to have:  A small amount of blood in your stool for 24 hours after the procedure.  Some gas.  Mild abdominal cramping or bloating. Follow these instructions at home: General instructions  For the first 24 hours after the procedure:  Do not drive or use machinery.  Do not sign important documents.  Do not drink alcohol.  Do your regular daily activities at a slower pace than normal.  Eat soft, easy-to-digest foods.  Rest often.  Take over-the-counter or prescription medicines only as told by your health care provider.  It is up to you to get the results of your procedure. Ask your health care provider, or the department performing the procedure, when your results will  be ready. Relieving cramping and bloating  Try walking around when you have cramps or feel bloated.  Apply heat to your abdomen as told by your health care provider. Use a heat source that your health care provider recommends, such as a moist heat pack or a heating pad.  Place a towel between your skin and the heat source.  Leave the heat on for 20-30 minutes.  Remove the heat if your skin turns bright red. This is especially important if you are unable to feel pain, heat, or cold. You may have a greater risk of getting burned. Eating and drinking  Drink enough fluid to keep your urine clear or pale yellow.  Resume your normal diet as instructed by your health care provider. Avoid heavy or fried foods that are hard to digest.  Avoid drinking alcohol for as long as instructed by your health care provider. Contact a health care provider if:  You have blood in your stool 2-3 days after the procedure. Get help right away if:  You have more than a small spotting of blood in your stool.  You pass large blood clots in your stool.  Your abdomen is swollen.  You have nausea or vomiting.  You have a fever.  You have increasing abdominal pain that is not relieved with medicine. This information is not intended to replace advice given to you by your health care  provider. Make sure you discuss any questions you have with your health care provider. Document Released: 03/23/2004 Document Revised: 05/03/2016 Document Reviewed: 10/21/2015 Elsevier Interactive Patient Education  2017 [employer].

## 2016-10-18 ENCOUNTER — Encounter (HOSPITAL_COMMUNITY): Payer: Self-pay

## 2016-10-18 ENCOUNTER — Encounter (HOSPITAL_COMMUNITY)
Admission: RE | Admit: 2016-10-18 | Discharge: 2016-10-18 | Disposition: A | Discharge status: Home / Self Care | Payer: Medicare Other | Source: Ambulatory Visit | Attending: Internal Medicine | Admitting: Internal Medicine

## 2016-10-18 DIAGNOSIS — Z7984 Long term (current) use of oral hypoglycemic drugs: Secondary | ICD-10-CM | POA: Diagnosis not present

## 2016-10-18 DIAGNOSIS — Z79899 Other long term (current) drug therapy: Secondary | ICD-10-CM | POA: Diagnosis not present

## 2016-10-18 DIAGNOSIS — K861 Other chronic pancreatitis: Secondary | ICD-10-CM | POA: Diagnosis not present

## 2016-10-18 DIAGNOSIS — K219 Gastro-esophageal reflux disease without esophagitis: Secondary | ICD-10-CM | POA: Diagnosis not present

## 2016-10-18 DIAGNOSIS — Z01812 Encounter for preprocedural laboratory examination: Secondary | ICD-10-CM

## 2016-10-18 DIAGNOSIS — E119 Type 2 diabetes mellitus without complications: Secondary | ICD-10-CM | POA: Diagnosis not present

## 2016-10-18 DIAGNOSIS — Z01810 Encounter for preprocedural cardiovascular examination: Secondary | ICD-10-CM | POA: Insufficient documentation

## 2016-10-18 DIAGNOSIS — D123 Benign neoplasm of transverse colon: Secondary | ICD-10-CM | POA: Diagnosis not present

## 2016-10-18 DIAGNOSIS — E785 Hyperlipidemia, unspecified: Secondary | ICD-10-CM | POA: Diagnosis not present

## 2016-10-18 DIAGNOSIS — J449 Chronic obstructive pulmonary disease, unspecified: Secondary | ICD-10-CM | POA: Diagnosis not present

## 2016-10-18 DIAGNOSIS — K5730 Diverticulosis of large intestine without perforation or abscess without bleeding: Secondary | ICD-10-CM | POA: Diagnosis not present

## 2016-10-18 DIAGNOSIS — Z85528 Personal history of other malignant neoplasm of kidney: Secondary | ICD-10-CM | POA: Diagnosis not present

## 2016-10-18 DIAGNOSIS — F17200 Nicotine dependence, unspecified, uncomplicated: Secondary | ICD-10-CM | POA: Diagnosis not present

## 2016-10-18 DIAGNOSIS — R51 Headache: Secondary | ICD-10-CM | POA: Diagnosis not present

## 2016-10-18 DIAGNOSIS — Z8711 Personal history of peptic ulcer disease: Secondary | ICD-10-CM | POA: Diagnosis not present

## 2016-10-18 DIAGNOSIS — I10 Essential (primary) hypertension: Secondary | ICD-10-CM | POA: Insufficient documentation

## 2016-10-18 DIAGNOSIS — K2970 Gastritis, unspecified, without bleeding: Secondary | ICD-10-CM | POA: Diagnosis not present

## 2016-10-18 DIAGNOSIS — K921 Melena: Secondary | ICD-10-CM | POA: Diagnosis not present

## 2016-10-18 DIAGNOSIS — N289 Disorder of kidney and ureter, unspecified: Secondary | ICD-10-CM | POA: Diagnosis not present

## 2016-10-18 DIAGNOSIS — G8929 Other chronic pain: Secondary | ICD-10-CM | POA: Diagnosis not present

## 2016-10-18 LAB — BASIC METABOLIC PANEL
ANION GAP: 6 (ref 5–15)
BUN: 18 mg/dL (ref 6–20)
CHLORIDE: 102 mmol/L (ref 101–111)
CO2: 27 mmol/L (ref 22–32)
Calcium: 9.4 mg/dL (ref 8.9–10.3)
Creatinine, Ser: 1.31 mg/dL — ABNORMAL HIGH (ref 0.61–1.24)
GFR calc Af Amer: >60 mL/min (ref >60)
GFR, EST NON AFRICAN AMERICAN: 55 mL/min — AB (ref >60)
GLUCOSE: 278 mg/dL — AB (ref 65–99)
POTASSIUM: 4.2 mmol/L (ref 3.5–5.1)
Sodium: 135 mmol/L (ref 135–145)

## 2016-10-18 LAB — CBC
HEMATOCRIT: 42 % (ref 39.0–52.0)
Hemoglobin: 14.5 g/dL (ref 13.0–17.0)
MCH: 28.6 pg (ref 26.0–34.0)
MCHC: 34.5 g/dL (ref 30.0–36.0)
MCV: 82.8 fL (ref 78.0–100.0)
PLATELETS: 260 10*3/uL (ref 150–400)
RBC: 5.07 MIL/uL (ref 4.22–5.81)
RDW: 14.1 % (ref 11.5–15.5)
WBC: 6.9 10*3/uL (ref 4.0–10.5)

## 2016-10-21 ENCOUNTER — Ambulatory Visit (HOSPITAL_COMMUNITY)
Admission: RE | Admit: 2016-10-21 | Discharge: 2016-10-21 | Disposition: A | Discharge status: Home / Self Care | Payer: Medicare Other | Source: Ambulatory Visit | Attending: Internal Medicine | Admitting: Internal Medicine

## 2016-10-21 ENCOUNTER — Encounter (HOSPITAL_COMMUNITY)
Admission: RE | Disposition: A | Discharge status: Home / Self Care | Payer: Self-pay | Source: Ambulatory Visit | Attending: Internal Medicine

## 2016-10-21 ENCOUNTER — Ambulatory Visit (HOSPITAL_COMMUNITY): Payer: Medicare Other | Admitting: Anesthesiology

## 2016-10-21 ENCOUNTER — Encounter (HOSPITAL_COMMUNITY): Payer: Self-pay

## 2016-10-21 DIAGNOSIS — E119 Type 2 diabetes mellitus without complications: Secondary | ICD-10-CM | POA: Insufficient documentation

## 2016-10-21 DIAGNOSIS — Z7984 Long term (current) use of oral hypoglycemic drugs: Secondary | ICD-10-CM | POA: Insufficient documentation

## 2016-10-21 DIAGNOSIS — D123 Benign neoplasm of transverse colon: Secondary | ICD-10-CM | POA: Diagnosis not present

## 2016-10-21 DIAGNOSIS — Z8711 Personal history of peptic ulcer disease: Secondary | ICD-10-CM | POA: Insufficient documentation

## 2016-10-21 DIAGNOSIS — I10 Essential (primary) hypertension: Secondary | ICD-10-CM | POA: Insufficient documentation

## 2016-10-21 DIAGNOSIS — Z85528 Personal history of other malignant neoplasm of kidney: Secondary | ICD-10-CM | POA: Insufficient documentation

## 2016-10-21 DIAGNOSIS — K219 Gastro-esophageal reflux disease without esophagitis: Secondary | ICD-10-CM | POA: Insufficient documentation

## 2016-10-21 DIAGNOSIS — K921 Melena: Secondary | ICD-10-CM | POA: Diagnosis not present

## 2016-10-21 DIAGNOSIS — K5730 Diverticulosis of large intestine without perforation or abscess without bleeding: Secondary | ICD-10-CM | POA: Insufficient documentation

## 2016-10-21 DIAGNOSIS — R51 Headache: Secondary | ICD-10-CM | POA: Insufficient documentation

## 2016-10-21 DIAGNOSIS — E785 Hyperlipidemia, unspecified: Secondary | ICD-10-CM | POA: Insufficient documentation

## 2016-10-21 DIAGNOSIS — G8929 Other chronic pain: Secondary | ICD-10-CM | POA: Insufficient documentation

## 2016-10-21 DIAGNOSIS — Z79899 Other long term (current) drug therapy: Secondary | ICD-10-CM | POA: Diagnosis not present

## 2016-10-21 DIAGNOSIS — J449 Chronic obstructive pulmonary disease, unspecified: Secondary | ICD-10-CM | POA: Insufficient documentation

## 2016-10-21 DIAGNOSIS — K861 Other chronic pancreatitis: Secondary | ICD-10-CM | POA: Insufficient documentation

## 2016-10-21 DIAGNOSIS — N289 Disorder of kidney and ureter, unspecified: Secondary | ICD-10-CM | POA: Insufficient documentation

## 2016-10-21 DIAGNOSIS — K625 Hemorrhage of anus and rectum: Secondary | ICD-10-CM

## 2016-10-21 DIAGNOSIS — K2970 Gastritis, unspecified, without bleeding: Secondary | ICD-10-CM | POA: Insufficient documentation

## 2016-10-21 DIAGNOSIS — F17200 Nicotine dependence, unspecified, uncomplicated: Secondary | ICD-10-CM | POA: Insufficient documentation

## 2016-10-21 HISTORY — PX: COLONOSCOPY WITH PROPOFOL: SHX5780

## 2016-10-21 HISTORY — PX: POLYPECTOMY: SHX5525

## 2016-10-21 LAB — GLUCOSE, CAPILLARY
GLUCOSE-CAPILLARY: 167 mg/dL — AB (ref 65–99)
Glucose-Capillary: 151 mg/dL — ABNORMAL HIGH (ref 65–99)

## 2016-10-21 SURGERY — COLONOSCOPY WITH PROPOFOL
Anesthesia: Monitor Anesthesia Care

## 2016-10-21 MED ORDER — FENTANYL CITRATE (PF) 100 MCG/2ML IJ SOLN
INTRAMUSCULAR | Status: AC
  Filled 2016-10-21: qty 2

## 2016-10-21 MED ORDER — LIDOCAINE HCL (CARDIAC) 10 MG/ML IV SOLN
INTRAVENOUS | Status: DC | PRN
  Administered 2016-10-21: 50 mg via INTRAVENOUS

## 2016-10-21 MED ORDER — CHLORHEXIDINE GLUCONATE CLOTH 2 % EX PADS: 6.0000 | MEDICATED_PAD | Freq: Once | CUTANEOUS | Status: DC

## 2016-10-21 MED ORDER — PROPOFOL 500 MG/50ML IV EMUL
INTRAVENOUS | Status: DC | PRN
  Administered 2016-10-21: 75 ug/kg/min via INTRAVENOUS

## 2016-10-21 MED ORDER — PROPOFOL 10 MG/ML IV BOLUS
INTRAVENOUS | Status: AC
  Filled 2016-10-21: qty 40

## 2016-10-21 MED ORDER — PROPOFOL 10 MG/ML IV BOLUS
INTRAVENOUS | Status: DC | PRN
  Administered 2016-10-21 (×2): 15 mg via INTRAVENOUS

## 2016-10-21 MED ORDER — FENTANYL CITRATE (PF) 100 MCG/2ML IJ SOLN
25.0000 ug | INTRAMUSCULAR | Status: AC
  Administered 2016-10-21 (×2): 25 ug via INTRAVENOUS

## 2016-10-21 MED ORDER — LIDOCAINE HCL (PF) 1 % IJ SOLN
INTRAMUSCULAR | Status: AC
  Filled 2016-10-21: qty 5

## 2016-10-21 MED ORDER — MIDAZOLAM HCL 2 MG/2ML IJ SOLN
INTRAMUSCULAR | Status: AC
  Filled 2016-10-21: qty 2

## 2016-10-21 MED ORDER — MIDAZOLAM HCL 2 MG/2ML IJ SOLN
1.0000 mg | INTRAMUSCULAR | Status: AC
  Administered 2016-10-21: 2 mg via INTRAVENOUS

## 2016-10-21 MED ORDER — LACTATED RINGERS IV SOLN
INTRAVENOUS | Status: DC
  Administered 2016-10-21: 08:00:00 via INTRAVENOUS

## 2016-10-21 NOTE — Op Note (Signed)
[facility] Patient Name: Colin Wagner Procedure Date: 10/21/2016 8:24 AM MRN: [id number] Date of Birth: [birth year] Attending MD: Norvel Richards , MD CSN: [id number] Age: 66 Admit Type: Outpatient Procedure:                Colonoscopy with snare polypectomy Indications:              Hematochezia Providers:                Norvel Richards, MD, Lurline Del, RN, Purcell Nails.                            [place], Merchant [employer] officer Referring MD:              Medicines:                Propofol per Anesthesia Complications:            No immediate complications. Estimated Blood Loss:     Estimated blood loss was minimal. Procedure:                Pre-Anesthesia Assessment:                           - Prior to the procedure, a History and Physical                            was performed, and patient medications and                            allergies were reviewed. The patient's tolerance of                            previous anesthesia was also reviewed. The risks                            and benefits of the procedure and the sedation                            options and risks were discussed with the patient.                            All questions were answered, and informed consent                            was obtained. Prior Anticoagulants: The patient has                            taken no previous anticoagulant or antiplatelet                            agents. ASA Grade Assessment: III - A patient with                            severe systemic disease. After reviewing the risks  and benefits, the patient was deemed in                            satisfactory condition to undergo the procedure.                           After obtaining informed consent, the colonoscope                            was passed under direct vision. Throughout the                            procedure, the patient's blood pressure, pulse, and                             oxygen saturations were monitored continuously. The                            EC-[id number] ([id number]) scope was introduced through                            the and advanced to the the cecum, identified by                            appendiceal orifice and ileocecal valve. The                            ileocecal valve, appendiceal orifice, and rectum                            were photographed. The entire colon was well                            visualized. The quality of the bowel preparation                            was adequate. The ileocecal valve, appendiceal                            orifice, and rectum were photographed. Scope In: 8:30:53 AM Scope Out: 8:46:26 AM Scope Withdrawal Time: 0 hours 9 minutes 4 seconds  Total Procedure Duration: 0 hours 15 minutes 33 seconds  Findings:      The perianal and digital rectal examinations were normal.      Multiple small and large-mouthed diverticula were found in the entire       colon.      Two semi-pedunculated polyps were found in the hepatic flexure. The       polyps were 5 to 7 mm in size. These polyps were removed with a cold       snare. Resection and retrieval were complete. Estimated blood loss was       minimal.      The exam was otherwise without abnormality on direct and retroflexion       views. Impression:               -  Diverticulosis in the entire examined colon.                           - Two 5 to 7 mm polyps at the hepatic flexure,                            removed with a cold snare. Resected and retrieved.                           - The examination was otherwise normal on direct                            and retroflexion views. Moderate Sedation:      Moderate (conscious) sedation was personally administered by an       anesthesia professional. The following parameters were monitored: oxygen       saturation, heart rate, blood pressure, respiratory rate, EKG, adequacy       of pulmonary ventilation, and  response to care. Total physician       intraservice time was 23 minutes. Recommendation:           - Patient has a contact number available for                            emergencies. The signs and symptoms of potential                            delayed complications were discussed with the                            patient. Return to normal activities tomorrow.                            Written discharge instructions were provided to the                            patient.                           - Resume previous diet.                           - Continue present medications. Add Benefiber 1                            tablespoon twice daily to regimen.                           - Repeat colonoscopy date to be determined after                            pending pathology results are reviewed for                            surveillance based on pathology results.                           -  Return to GI office (date not yet determined). Procedure Code(s):        --- Professional ---                           [phone], Colonoscopy, flexible; with removal of                            tumor(s), polyp(s), or other lesion(s) by snare                            technique Diagnosis Code(s):        --- Professional ---                           D12.3, Benign neoplasm of transverse colon (hepatic                            flexure or splenic flexure)                           K92.1, Melena (includes Hematochezia)                           K57.30, Diverticulosis of large intestine without                            perforation or abscess without bleeding CPT copyright 2016 American Medical Association. All rights reserved. The codes documented in this report are preliminary and upon coder review may  be revised to meet current compliance requirements. Cristopher Estimable. Nefertari Rebman, MD Norvel Richards, MD 10/21/2016 8:52:13 AM This report has been signed electronically. Number of Addenda: 0

## 2016-10-21 NOTE — Transfer of Care (Signed)
Immediate Anesthesia Transfer of Care Note  Patient: Colin Wagner  Procedure(s) Performed: Procedure(s) with comments: COLONOSCOPY WITH PROPOFOL (N/A) - 8:30 am POLYPECTOMY - hepatic flexure x2  Patient Location: PACU  Anesthesia Type:MAC  Level of Consciousness: awake and patient cooperative  Airway & Oxygen Therapy: Patient Spontanous Breathing and Patient connected to nasal cannula oxygen  Post-op Assessment: Report given to RN and Post -op Vital signs reviewed and stable  Post vital signs: Reviewed and stable  Last Vitals:  Vitals:   10/21/16 0800 10/21/16 0815  BP: (!) 136/99 (!) 152/83  Pulse: 79 86  Resp: 14 16  Temp:      Last Pain:  Vitals:   10/21/16 0738  TempSrc: Oral      Patients Stated Pain Goal: 8 (16/55/37 4827)  Complications: No apparent anesthesia complications

## 2016-10-21 NOTE — Discharge Instructions (Signed)
°  Begin Benefiber 1 tablespoon twice daily  Further recommendations to follow pending review of pathology report     Colonoscopy Discharge Instructions  Read the instructions outlined below and refer to this sheet in the next few weeks. These discharge instructions provide you with general information on caring for yourself after you leave the hospital. Your doctor may also give you specific instructions. While your treatment has been planned according to the most current medical practices available, unavoidable complications occasionally occur. If you have any problems or questions after discharge, call Dr. [name] at [phone]. ACTIVITY  You may resume your regular activity, but move at a slower pace for the next 24 hours.   Take frequent rest periods for the next 24 hours.   Walking will help get rid of the air and reduce the bloated feeling in your belly (abdomen).   No driving for 24 hours (because of the medicine (anesthesia) used during the test).    Do not sign any important legal documents or operate any machinery for 24 hours (because of the anesthesia used during the test).  NUTRITION  Drink plenty of fluids.   You may resume your normal diet as instructed by your doctor.   Begin with a light meal and progress to your normal diet. Heavy or fried foods are harder to digest and may make you feel sick to your stomach (nauseated).   Avoid alcoholic beverages for 24 hours or as instructed.  MEDICATIONS  You may resume your normal medications unless your doctor tells you otherwise.  WHAT YOU CAN EXPECT TODAY  Some feelings of bloating in the abdomen.   Passage of more gas than usual.   Spotting of blood in your stool or on the toilet paper.  IF YOU HAD POLYPS REMOVED DURING THE COLONOSCOPY:  No aspirin products for 7 days or as instructed.   No alcohol for 7 days or as instructed.   Eat a soft diet for the next 24 hours.  FINDING OUT THE RESULTS OF YOUR TEST Not all  test results are available during your visit. If your test results are not back during the visit, make an appointment with your caregiver to find out the results. Do not assume everything is normal if you have not heard from your caregiver or the medical facility. It is important for you to follow up on all of your test results.  SEEK IMMEDIATE MEDICAL ATTENTION IF:  You have more than a spotting of blood in your stool.   Your belly is swollen (abdominal distention).   You are nauseated or vomiting.   You have a temperature over 101.   You have abdominal pain or discomfort that is severe or gets worse throughout the day. Colon polyp and diverticulosis information provided

## 2016-10-21 NOTE — H&P (View-Only) (Signed)
Primary Care Physician:  Robert Bellow, MD  Primary Gastroenterologist:  Garfield Cornea, MD   Chief Complaint  Patient presents with  . Colonoscopy    HPI:  Colin Wagner is a 66 y.o. male here at the request of PCP for colonoscopy. Patient's last colonoscopy was in 2008 by Dr. [name]. He had diverticulosis. Patient reports recent brbpr that lasted less than a day. No constipation/diarrhea. No melena, no abdominal pain. Good appetite. No heartburn. No dysphagia. Weight is stable.   He is on chronic pain medication and has h/o remote etoh abuse.   Current Outpatient Prescriptions  Medication Sig Dispense Refill  . albuterol (PROVENTIL HFA;VENTOLIN HFA) 108 (90 Base) MCG/ACT inhaler Inhale 1-2 puffs into the lungs every 6 (six) hours as needed for wheezing or shortness of breath. 1 Inhaler 0  . cetirizine (ZYRTEC) 10 MG tablet Take 10 mg by mouth daily.  11  . cyclobenzaprine (FLEXERIL) 10 MG tablet Take 1 tablet (10 mg total) by mouth 3 (three) times daily. 20 tablet 0  . glipiZIDE (GLUCOTROL) 5 MG tablet Take 5 mg by mouth daily before breakfast. PT said he takes it as needed ( if BS gets up to 200 he said)    . HYDROcodone-acetaminophen (NORCO) 7.5-325 MG per tablet Take 1 tablet by mouth 3 (three) times daily as needed. For back, hip, and/or knee pain 20 tablet 0  . lisinopril (PRINIVIL,ZESTRIL) 5 MG tablet Take 5 mg by mouth daily. Takes one tablet a day 2-3 times a week  3  . LORazepam (ATIVAN) 1 MG tablet Take 0.5-1 tablets by mouth 3 (three) times daily as needed (only takes one tablet daily). For anxiety  5  . metFORMIN (GLUCOPHAGE-XR) 500 MG 24 hr tablet Take 500-1,000 mg by mouth 3 (three) times daily. 2 tabs in the morning, then takes one at lunch and at dinner.  11  . Multiple Vitamin (MULTIVITAMIN WITH MINERALS) TABS tablet Take 1 tablet by mouth daily.    Marland Kitchen omeprazole (PRILOSEC) 20 MG capsule Take 20 mg by mouth 2 (two) times daily.  11  . sildenafil (REVATIO) 20 MG  tablet Take 20 mg by mouth 3 (three) times daily. As directed    . tamsulosin (FLOMAX) 0.4 MG CAPS capsule Take 0.4 mg by mouth daily.    Marland Kitchen testosterone cypionate (DEPOTESTOSTERONE CYPIONATE) 200 MG/ML injection every 2 weeks  5  . thiamine 100 MG tablet Take 1 tablet (100 mg total) by mouth daily. 30 tablet    No current facility-administered medications for this visit.     Allergies as of 09/28/2016  . (No Known Allergies)    Past Medical History:  Diagnosis Date  . Alcohol abuse   . Alcoholic pancreatitis 8657   admission  . Chronic back pain   . Chronic pancreatitis ([place])    based on ct findings 2016  . COPD (chronic obstructive pulmonary disease) ([place])   . Diabetes mellitus   . Diverticulosis   . Gastritis   . GERD (gastroesophageal reflux disease)   . Headache   . Hyperlipidemia   . Hypertension   . Peptic ulcer disease 1999   Per medical reports, no H pylori  . Renal cancer, left ([place]) 2012  . Right shoulder pain   . Testicular hypofunction     Past Surgical History:  Procedure Laterality Date  . COLONOSCOPY  2003   Dr. [name], polyps  . COLONOSCOPY  2005   Dr. [name], multiple diverticula  . COLONOSCOPY  2008   Dr. [name], diverticulosis  . ESOPHAGOGASTRODUODENOSCOPY     Multiple EGDs. 1999 EGD showed gastric ulcers, no H pylori and benign biopsies performed by Dr. [name]. 2001 gastric ulcer healed. Last EGD 2005 had gastritis.  Marland Kitchen LUNG BIOPSY    . PARTIAL NEPHRECTOMY Left 2012    Family History  Problem Relation Age of Onset  . Hypertension Mother   . Colon cancer Neg Hx     Social History   Social History  . Marital status: Single    Spouse name: N/A  . Number of children: N/A  . Years of education: N/A   Occupational History  . Not on file.   Social History Main Topics  . Smoking status: Current Some Day Smoker  . Smokeless tobacco: Never Used  . Alcohol use No     Comment: quit 2000 but relapse in 2016. no etoh since  hospitalized 2016.   . Drug use: No  . Sexual activity: Not on file   Other Topics Concern  . Not on file   Social History Narrative  . No narrative on file      ROS:  General: Negative for anorexia, weight loss, fever, chills, fatigue, weakness. Eyes: Negative for vision changes.  ENT: Negative for hoarseness, difficulty swallowing , nasal congestion. CV: Negative for chest pain, angina, palpitations, dyspnea on exertion, peripheral edema.  Respiratory: Negative for dyspnea at rest, dyspnea on exertion, cough, sputum, wheezing.  GI: See history of present illness. GU:  Negative for dysuria, hematuria, urinary incontinence, urinary frequency, nocturnal urination.  MS: Negative for joint pain, +chronic low back pain.  Derm: Negative for rash or itching.  Neuro: Negative for weakness, abnormal sensation, seizure, frequent headaches, memory loss, confusion.  Psych: Negative for anxiety, depression, suicidal ideation, hallucinations.  Endo: Negative for unusual weight change.  Heme: Negative for bruising or bleeding. Allergy: Negative for rash or hives.    Physical Examination:  BP (!) 177/96   Pulse (!) 102   Temp 97.2 F (36.2 C) (Oral)   Ht 6' (1.829 m)   Wt 182 lb 3.2 oz (82.6 kg)   BMI 24.71 kg/m    General: Well-nourished, well-developed in no acute distress.  Head: Normocephalic, atraumatic.   Eyes: Conjunctiva pink, no icterus. Mouth: Oropharyngeal mucosa moist and pink , no lesions erythema or exudate. Neck: Supple without thyromegaly, masses, or lymphadenopathy.  Lungs: Clear to auscultation bilaterally.  Heart: Regular rate and rhythm, no murmurs rubs or gallops.  Abdomen: Bowel sounds are normal, nontender, nondistended, no hepatosplenomegaly or masses, no abdominal bruits or    hernia , no rebound or guarding.   Rectal: deferred Extremities: No lower extremity edema. No clubbing or deformities.  Neuro: Alert and oriented x 4 , grossly normal neurologically.   Skin: Warm and dry, no rash or jaundice.   Psych: Alert and cooperative, normal mood and affect.   Imaging Studies: Dg Chest 2 View  Result Date: 09/25/2016 CLINICAL DATA:  Nonproductive cough. EXAM: CHEST  2 VIEW COMPARISON:  Radiographs of June 16, 2016. FINDINGS: Stable cardiomediastinal silhouette. Both lungs are clear. No pneumothorax or pleural effusion is noted. The visualized skeletal structures are unremarkable. IMPRESSION: No active cardiopulmonary disease. Electronically Signed   By: Marijo Conception, M.D.   On: 09/25/2016 13:19   Dg Cervical Spine Complete  Result Date: 09/11/2016 CLINICAL DATA:  Worsening left-sided neck pain today. No known injury. EXAM: CERVICAL SPINE - COMPLETE 4+ VIEW COMPARISON:  None. FINDINGS: There is no  evidence of cervical spine fracture or prevertebral soft tissue swelling. Alignment is normal. Anterior syndesmophyte formation seen without disc space narrowing, consistent with DISH. No evidence of facet arthropathy or other bone lesions. Bilateral carotid bulb atherosclerotic calcification noted. IMPRESSION: No acute findings. Electronically Signed   By: Earle Gell M.D.   On: 09/11/2016 17:07

## 2016-10-21 NOTE — Anesthesia Procedure Notes (Signed)
Procedure Name: MAC Date/Time: 10/21/2016 8:20 AM Performed by: Vista Deck Pre-anesthesia Checklist: Patient identified, Emergency Drugs available, Suction available, Timeout performed and Patient being monitored Patient Re-evaluated:Patient Re-evaluated prior to inductionOxygen Delivery Method: Non-rebreather mask

## 2016-10-21 NOTE — Anesthesia Postprocedure Evaluation (Signed)
Anesthesia Post Note  Patient: Colin Wagner  Procedure(s) Performed: Procedure(s) (LRB): COLONOSCOPY WITH PROPOFOL (N/A) POLYPECTOMY  Patient location during evaluation: PACU Anesthesia Type: MAC Level of consciousness: awake and alert Pain management: satisfactory to patient Vital Signs Assessment: post-procedure vital signs reviewed and stable Respiratory status: spontaneous breathing Cardiovascular status: stable Anesthetic complications: no     Last Vitals:  Vitals:   10/21/16 0815 10/21/16 0855  BP: (!) 152/83 (!) 139/96  Pulse: 86 87  Resp: 16 17  Temp:  36.7 C    Last Pain:  Vitals:   10/21/16 0738  TempSrc: Oral                 Kohei Antonellis

## 2016-10-21 NOTE — Anesthesia Preprocedure Evaluation (Signed)
Anesthesia Evaluation  Patient identified by MRN, date of birth, ID band Patient awake    Reviewed: Allergy & Precautions, NPO status , Patient's Chart, lab work & pertinent test results  Airway Mallampati: II  TM Distance: >3 FB     Dental  (+) Teeth Intact   Pulmonary COPD, Current Smoker,    breath sounds clear to auscultation       Cardiovascular hypertension, Pt. on medications  Rhythm:Regular Rate:Normal     Neuro/Psych  Headaches,    GI/Hepatic PUD, GERD  Controlled,(+)     substance abuse  alcohol use,   Endo/Other  diabetes, Type 2  Renal/GU Renal disease     Musculoskeletal   Abdominal   Peds  Hematology   Anesthesia Other Findings   Reproductive/Obstetrics                             Anesthesia Physical Anesthesia Plan  ASA: III  Anesthesia Plan: MAC   Post-op Pain Management:    Induction: Intravenous  Airway Management Planned: Simple Face Mask  Additional Equipment:   Intra-op Plan:   Post-operative Plan:   Informed Consent: I have reviewed the patients History and Physical, chart, labs and discussed the procedure including the risks, benefits and alternatives for the proposed anesthesia with the patient or authorized representative who has indicated his/her understanding and acceptance.     Plan Discussed with:   Anesthesia Plan Comments:         Anesthesia Quick Evaluation

## 2016-10-21 NOTE — Interval H&P Note (Signed)
History and Physical Interval Note:  10/21/2016 7:30 AM  Colin Wagner  has presented today for surgery, with the diagnosis of rectal bleeding  The various methods of treatment have been discussed with the patient and family. After consideration of risks, benefits and other options for treatment, the patient has consented to  Procedure(s) with comments: COLONOSCOPY WITH PROPOFOL (N/A) - 8:30 am as a surgical intervention .  The patient's history has been reviewed, patient examined, no change in status, stable for surgery.  I have reviewed the patient's chart and labs.  Questions were answered to the patient's satisfaction.     Jhett Fretwell  No change.  Diagnostic TCS per plan. The risks, benefits, limitations, alternatives and imponderables have been reviewed with the patient. Questions have been answered. All parties are agreeable.

## 2016-10-21 NOTE — Interval H&P Note (Signed)
History and Physical Interval Note:  10/21/2016 8:19 AM  Colin Wagner  has presented today for surgery, with the diagnosis of rectal bleeding  The various methods of treatment have been discussed with the patient and family. After consideration of risks, benefits and other options for treatment, the patient has consented to  Procedure(s) with comments: COLONOSCOPY WITH PROPOFOL (N/A) - 8:30 am as a surgical intervention .  The patient's history has been reviewed, patient examined, no change in status, stable for surgery.  I have reviewed the patient's chart and labs.  Questions were answered to the patient's satisfaction.     Robert Rourk  No change. Diagnostic colonoscopy per plan.  The risks, benefits, limitations, alternatives and imponderables have been reviewed with the patient. Questions have been answered. All parties are agreeable.

## 2016-10-24 ENCOUNTER — Encounter: Payer: Self-pay | Admitting: Internal Medicine

## 2016-10-25 ENCOUNTER — Encounter (HOSPITAL_COMMUNITY): Payer: Self-pay | Admitting: Internal Medicine

## 2016-12-29 ENCOUNTER — Encounter: Payer: Self-pay | Admitting: Radiation Oncology

## 2016-12-30 NOTE — Progress Notes (Signed)
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   Patient presented  months ago with symptoms of: He presented to his regular dentist with Left jaw pain.   Biopsies of Left retromolar trigone lesion by Dr. Otilio Carpen (oral surgeon)  revealed: Squamous cell carcinoma  Nutrition Status Yes No Comments  Weight changes? '[x]'$  '[]'$  He tells me he has several lbs over the last couple of weeks. (per EPIC he has lost 13 lbs since 10/21/16)  Swallowing concerns? '[x]'$  '[]'$  He has difficulty opening his mouth wide to chew because of pain.   PEG? '[]'$  '[x]'$     Referrals Yes No Comments  Social Work? '[]'$  '[x]'$    Dentistry? '[x]'$  '[]'$  His regular dentist found lesion, sent him to Dr. Otilio Carpen an oral surgeon.   Swallowing therapy? '[]'$  '[x]'$    Nutrition? '[]'$  '[x]'$    Med/Onc? '[]'$  '[x]'$     Safety Issues Yes No Comments  Prior radiation? '[]'$  '[x]'$    Pacemaker/ICD? '[]'$  '[x]'$    Possible current pregnancy? '[]'$  '[x]'$    Is the patient on methotrexate? '[]'$  '[x]'$     Tobacco/Marijuana/Snuff/ETOH use: He formally abused alcohol, quiting in 2000, relapse in 2016 and stopping after a hospitalization in 2016. He is a current smoker, no smokeless tobacco use.   Past/Anticipated interventions by otolaryngology, if any:  ? Referred to [facility]NT , he tells me that he does not want to go to see the ENT. He was referred to [place] by his PCP Dr. Karie Kirks.   Past/Anticipated interventions by medical oncology, if any:  None    Current Complaints / other details:  PET 01/12/17  BP (!) 142/87   Pulse 91   Temp 98.9 F (37.2 C)   Ht 6' (1.829 m)   Wt 178 lb 9.6 oz (81 kg)   SpO2 98% Comment: room air  BMI 24.22 kg/m    Wt Readings from Last 3 Encounters:  01/05/17 178 lb 9.6 oz (81 kg)  10/21/16 191 lb (86.6 kg)  10/18/16 191 lb (86.6 kg)

## 2017-01-03 ENCOUNTER — Telehealth: Payer: Self-pay | Admitting: *Deleted

## 2017-01-03 ENCOUNTER — Other Ambulatory Visit (HOSPITAL_COMMUNITY): Payer: Self-pay | Admitting: Family Medicine

## 2017-01-03 ENCOUNTER — Ambulatory Visit (HOSPITAL_COMMUNITY)
Admission: RE | Admit: 2017-01-03 | Discharge: 2017-01-03 | Disposition: A | Discharge status: Home / Self Care | Payer: Medicare Other | Source: Ambulatory Visit | Attending: Family Medicine | Admitting: Family Medicine

## 2017-01-03 DIAGNOSIS — J439 Emphysema, unspecified: Secondary | ICD-10-CM | POA: Diagnosis not present

## 2017-01-03 DIAGNOSIS — C069 Malignant neoplasm of mouth, unspecified: Secondary | ICD-10-CM | POA: Diagnosis present

## 2017-01-03 DIAGNOSIS — I6523 Occlusion and stenosis of bilateral carotid arteries: Secondary | ICD-10-CM | POA: Insufficient documentation

## 2017-01-03 LAB — POCT I-STAT CREATININE: Creatinine, Ser: 1.9 mg/dL — ABNORMAL HIGH (ref 0.61–1.24)

## 2017-01-03 MED ORDER — IOPAMIDOL (ISOVUE-300) INJECTION 61%
75.0000 mL | Freq: Once | INTRAVENOUS | Status: AC | PRN
Start: 2017-01-03 — End: 2017-01-03
  Administered 2017-01-03: 60 mL via INTRAVENOUS

## 2017-01-03 NOTE — Telephone Encounter (Signed)
Oncology Nurse Navigator Documentation  Called Dr. Vickey Sages office, spoke with Janett Billow.   Requested orders for ASAP CT Neck w/ Contrast, CMP, CBC. Staging PET skull base to mid-thigh.  Received call back 1520 indicating CT Neck and labs to be conducted today, PET to be scheduled.  [name], RN, BSN, [place] [facility]ck Oncology Nurse [place] at [place] [phone]

## 2017-01-03 NOTE — Telephone Encounter (Signed)
Oncology Nurse Navigator Documentation  Placed introductory call to new referral patient.  Introduced myself as the H&N oncology nurse navigator that works with Dr. Isidore Moos to whom he has been referred by Dr. Karie Kirks.  He confirmed his understanding of referral and appt date/time of 5/16 9:30/10:00.  I briefly explained my role as his navigator, indicated that I would be joining him during his appt.   I confirmed his understanding of [place] location, explained arrival and RadOnc registration process for appt.  I provided my contact information, encouraged him to call with questions/concerns.  [name], RN, BSN, [place] [facility]ck Oncology Nurse [place] at [place] [phone]

## 2017-01-03 NOTE — Telephone Encounter (Signed)
Call from pt returning missed call. Confirmed 5/16 appts, informed him it may have been the automated reminder he missed.

## 2017-01-05 ENCOUNTER — Other Ambulatory Visit: Payer: Self-pay | Admitting: Radiation Oncology

## 2017-01-05 ENCOUNTER — Ambulatory Visit
Admission: RE | Admit: 2017-01-05 | Discharge: 2017-01-05 | Disposition: A | Discharge status: Home / Self Care | Payer: Medicare Other | Source: Ambulatory Visit | Attending: Radiation Oncology | Admitting: Radiation Oncology

## 2017-01-05 ENCOUNTER — Encounter: Payer: Self-pay | Admitting: *Deleted

## 2017-01-05 ENCOUNTER — Encounter: Payer: Self-pay | Admitting: Radiation Oncology

## 2017-01-05 VITALS — BP 142/87 | HR 91 | Temp 98.9°F | Ht 72.0 in | Wt 178.6 lb

## 2017-01-05 DIAGNOSIS — E785 Hyperlipidemia, unspecified: Secondary | ICD-10-CM | POA: Diagnosis not present

## 2017-01-05 DIAGNOSIS — C060 Malignant neoplasm of cheek mucosa: Secondary | ICD-10-CM

## 2017-01-05 DIAGNOSIS — E119 Type 2 diabetes mellitus without complications: Secondary | ICD-10-CM | POA: Diagnosis not present

## 2017-01-05 DIAGNOSIS — Z905 Acquired absence of kidney: Secondary | ICD-10-CM | POA: Insufficient documentation

## 2017-01-05 DIAGNOSIS — F1011 Alcohol abuse, in remission: Secondary | ICD-10-CM | POA: Insufficient documentation

## 2017-01-05 DIAGNOSIS — R51 Headache: Secondary | ICD-10-CM | POA: Diagnosis not present

## 2017-01-05 DIAGNOSIS — F17210 Nicotine dependence, cigarettes, uncomplicated: Secondary | ICD-10-CM | POA: Insufficient documentation

## 2017-01-05 DIAGNOSIS — J449 Chronic obstructive pulmonary disease, unspecified: Secondary | ICD-10-CM | POA: Insufficient documentation

## 2017-01-05 DIAGNOSIS — Z7984 Long term (current) use of oral hypoglycemic drugs: Secondary | ICD-10-CM | POA: Diagnosis not present

## 2017-01-05 DIAGNOSIS — K219 Gastro-esophageal reflux disease without esophagitis: Secondary | ICD-10-CM | POA: Diagnosis not present

## 2017-01-05 DIAGNOSIS — Z85528 Personal history of other malignant neoplasm of kidney: Secondary | ICD-10-CM | POA: Insufficient documentation

## 2017-01-05 DIAGNOSIS — Z9889 Other specified postprocedural states: Secondary | ICD-10-CM | POA: Diagnosis not present

## 2017-01-05 DIAGNOSIS — Z8711 Personal history of peptic ulcer disease: Secondary | ICD-10-CM | POA: Diagnosis not present

## 2017-01-05 DIAGNOSIS — I10 Essential (primary) hypertension: Secondary | ICD-10-CM | POA: Insufficient documentation

## 2017-01-05 NOTE — Progress Notes (Signed)
Radiation Oncology         [phone] ________________________________  Initial outpatient Consultation  Name: Colin Wagner MRN: [id number]  Date: 01/05/2017  DOB: [birth year]  [id number], [name], MD  [name], MD   REFERRING PHYSICIAN: [name], MD  DIAGNOSIS:    ICD-9-CM ICD-10-CM   1. 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 male who presented to Dr. Otilio Carpen of the [facility] on 11/17/16 as a referral from his general dentist (Dr. Abel Presto) for evaluation of a non-resolving/non-healing soft tissue on the posterior left mouth. He had left-sided face/neck pain for several months and some difficulty chewing his food. On exam, a 1 cm diameter lesion in the left retromolar trigone region was noted and moderately tender to palpation. 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 Tumor was present at multiple margins. Biopsy by Dr Otilio Carpen.  Pertinent imaging thus far includes a CT of the neck w/ contrast performed on 01/03/17 revealed mild asymmetric soft tissue fullness at the posterior left mandibular body near the retromolar trigone without a definite discrete or measurable mass lesion.  We discussed him at tumor board this morning. We discussed his CT imaging and pathology report. His PET is pending. On CT scan the tumor appears to be 2.5 to 3 cm in greatest dimension. There is no clear bone invasion as the bone is stable compared to prior imaging. Left 1B and Level 2 nodes are indeterminate. The consensus is that the patient should see dentistry for pre-treatment evaluation and otolaryngology as  he will need surgery as his first line treatment for cure.  [name], RN, our Head and Neck Oncology Navigator was present during this encounter.  Swallowing issues, if any: Difficulty opening his mouth wide enough to chew due to pain. He reports supplementing with Glucerna.  Weight Changes: The patient reports a several pound weight loss over the last couple of weeks. Per EPIC, he has lost 13 lbs since 10/21/16.  Pain status: He has pain on the left side of his mouth and neck.  Other Symptoms: otherwise in his USOH  Tobacco history, if any: Current smoker. Reports smoking 1-2 cigarettes a day.  ETOH abuse, if any: He formally abused alcohol, quite in 2000, and relapsed in 2016 and stopped after hospitalization in 2016.  Prior cancers, if any: Left sided renal cell carcinoma. Partial nephrectomy in 2012  PREVIOUS RADIATION THERAPY: No  PAST MEDICAL HISTORY:  has a past medical history of Alcohol abuse; Alcoholic pancreatitis (4128); Chronic back pain; Chronic pancreatitis ([place]); COPD (chronic obstructive pulmonary disease) ([place]); Diabetes mellitus; Diverticulosis; Gastritis; GERD (gastroesophageal reflux disease); Headache; Hyperlipidemia; Hypertension; Peptic ulcer disease (1999); Renal cancer, left ([place]) (2012); Right shoulder pain; and Testicular hypofunction.    PAST SURGICAL HISTORY: Past Surgical History:  Procedure Laterality Date  . COLONOSCOPY  2003   Dr. [name], polyps  . COLONOSCOPY  2005   Dr. [name], multiple diverticula  . COLONOSCOPY  2008   Dr. [name], diverticulosis  . COLONOSCOPY WITH PROPOFOL N/A 10/21/2016   Procedure: COLONOSCOPY WITH PROPOFOL;  Surgeon: [name], MD;  [facility];  Service:  Endoscopy;  Laterality: N/A;  8:30 am  . ESOPHAGOGASTRODUODENOSCOPY     Multiple EGDs. 1999 EGD showed gastric ulcers, no H pylori and benign biopsies performed by Dr. [name]. 2001 gastric ulcer healed. Last EGD 2005 had gastritis.  Marland Kitchen LUNG  BIOPSY    . PARTIAL NEPHRECTOMY Left 2012  . POLYPECTOMY  10/21/2016   Procedure: POLYPECTOMY;  Surgeon: [name], MD;  Location: [facility];  Service: Endoscopy;;  hepatic flexure x2    FAMILY HISTORY: family history includes Hypertension in his mother.  SOCIAL HISTORY:  reports that he has been smoking.  He has been smoking about 0.50 packs per day. He has never used smokeless tobacco. He reports that he does not drink alcohol or use drugs.  ALLERGIES: Patient has no known allergies.  MEDICATIONS:  Current Outpatient Prescriptions  Medication Sig Dispense Refill  . albuterol (PROVENTIL HFA;VENTOLIN HFA) 108 (90 Base) MCG/ACT inhaler Inhale 1-2 puffs into the lungs every 6 (six) hours as needed for wheezing or shortness of breath. 1 Inhaler 0  . B Complex-C (B-COMPLEX WITH VITAMIN C) tablet Take 1 tablet by mouth daily with lunch.    . cetirizine (ZYRTEC) 10 MG tablet Take 10 mg by mouth at bedtime.   11  . glipiZIDE (GLUCOTROL) 5 MG tablet Take 5 mg by mouth daily as needed (for high blood sugar). PT said he takes it as needed ( if BS gets up to 200 he said)     . HYDROcodone-acetaminophen (NORCO) 7.5-325 MG per tablet Take 1 tablet by mouth 3 (three) times daily as needed. For back, hip, and/or knee pain 20 tablet 0  . ibuprofen (ADVIL,MOTRIN) 200 MG tablet Take 400 mg by mouth every 8 (eight) hours as needed (for pain.).    Marland Kitchen lisinopril (PRINIVIL,ZESTRIL) 5 MG tablet Take 5 mg by mouth every evening.   3  . LORazepam (ATIVAN) 1 MG tablet Take 0.5-1 tablets by mouth 3 (three) times daily as needed for anxiety. For anxiety  5  . metFORMIN (GLUCOPHAGE-XR) 500 MG 24 hr tablet Take 500-1,000 mg by mouth 3 (three) times daily. 2 tabs in the morning, then takes one at lunch and at dinner.  11  . Multiple Vitamin (MULTIVITAMIN WITH MINERALS) TABS tablet Take 1 tablet by mouth daily.    Marland Kitchen omeprazole (PRILOSEC) 20 MG capsule Take 20 mg by mouth 2 (two) times daily.  11  . polyethylene  glycol-electrolytes (TRILYTE) 420 g solution Take 4,000 mLs by mouth as directed. 4000 mL 0  . sildenafil (REVATIO) 20 MG tablet Take 20 mg by mouth 3 (three) times daily. As directed    . tamsulosin (FLOMAX) 0.4 MG CAPS capsule Take 0.4 mg by mouth daily with lunch.     . Testosterone Cypionate 200 MG/ML KIT Inject 200 mg into the muscle every 14 (fourteen) days.     No current facility-administered medications for this encounter.     REVIEW OF SYSTEMS: A 10+ POINT REVIEW OF SYSTEMS WAS OBTAINED including neurology, dermatology, psychiatry, cardiac, respiratory, lymph, extremities, GI, GU, Musculoskeletal, constitutional,  HEENT.  All pertinent positives are noted in the HPI.  All others are negative.   PHYSICAL EXAM:  height is 6' (1.829 m) and weight is 178 lb 9.6 oz (81 kg). His temperature is 98.9 F (37.2 C). His blood pressure is 142/87 (abnormal) and his pulse is 91. His oxygen saturation is 98%.   General: Alert and oriented, in no acute distress HEENT: Head is normocephalic. Extraocular movements  are intact. Oropharynx is notable for a cavitary lesion in the left retromolar trigone that extends anteriorly to the left buccal mucosa, measuring approximately 3 cm on exam. He is missing several posterior teeth in the left maxilla and mandible. Neck: Neck is notable for appreciable adenopathy in the supraclavicular or cervical neck Heart: Regular in rate and rhythm with no murmurs, rubs, or gallops. Chest: Clear to auscultation bilaterally, with no rhonchi, wheezes, or rales. Abdomen: Soft, nontender, nondistended, with no rigidity or guarding. Extremities: No cyanosis or edema. Lymphatics: see Neck Exam Skin: No concerning lesions. Musculoskeletal: symmetric strength and muscle tone throughout. Neurologic: Cranial nerves II through XII are grossly intact. No obvious focalities. Speech is fluent. Coordination is intact. Psychiatric: Judgment and insight are intact. Affect is  appropriate.  ECOG = 0  0 - Asymptomatic (Fully active, able to carry on all predisease activities without restriction)  1 - Symptomatic but completely ambulatory (Restricted in physically strenuous activity but ambulatory and able to carry out work of a light or sedentary nature. For example, light housework, office work)  2 - Symptomatic, <50% in bed during the day (Ambulatory and capable of all self care but unable to carry out any work activities. Up and about more than 50% of waking hours)  3 - Symptomatic, >50% in bed, but not bedbound (Capable of only limited self-care, confined to bed or chair 50% or more of waking hours)  4 - Bedbound (Completely disabled. Cannot carry on any self-care. Totally confined to bed or chair)  5 - Death   Eustace Pen MM, Creech RH, Tormey DC, et al. [phone]). "Toxicity and response criteria of the [facility]". [place] Oncol. 5 (6): 649-55   LABORATORY DATA:  Lab Results  Component Value Date   WBC 6.9 10/18/2016   HGB 14.5 10/18/2016   HCT 42.0 10/18/2016   MCV 82.8 10/18/2016   PLT 260 10/18/2016   CMP     Component Value Date/Time   NA 135 10/18/2016 1101   K 4.2 10/18/2016 1101   CL 102 10/18/2016 1101   CO2 27 10/18/2016 1101   GLUCOSE 278 (H) 10/18/2016 1101   BUN 18 10/18/2016 1101   CREATININE 1.90 (H) 01/03/2017 1802   CALCIUM 9.4 10/18/2016 1101   PROT 5.8 (L) 04/29/2015 0527   ALBUMIN 2.9 (L) 04/29/2015 0527   AST 31 04/29/2015 0527   ALT 33 04/29/2015 0527   ALKPHOS 80 04/29/2015 0527   BILITOT 1.1 04/29/2015 0527   GFRNONAA 55 (L) 10/18/2016 1101   GFRAA >60 10/18/2016 1101         RADIOGRAPHY: Ct Soft Tissue Neck W Contrast  Result Date: 01/03/2017 CLINICAL DATA:  Initial evaluation for left-sided face/neck pain for several months. Recently diagnosed with mouth cancer. EXAM: CT NECK WITH CONTRAST TECHNIQUE: Multidetector CT imaging of the neck was performed using the standard protocol following  the bolus administration of intravenous contrast. CONTRAST:  36m ISOVUE-300 IOPAMIDOL (ISOVUE-300) INJECTION 61% COMPARISON:  None. FINDINGS: Pharynx and larynx: There is subtle asymmetric soft tissue fullness within the left oral cavity, at the posterior aspect of the left mandibular body near the retromolar trigone (series 13, image 34). No definite measurable or discrete mass lesion identified within this region. Oral cavity otherwise within normal limits without appreciable mass lesion. Evaluation mildly limited due to streak artifact from dental amalgam. No acute abnormality about the remaining dentition. [place] tonsils symmetric and within normal limits. Parapharyngeal fat preserved. Nasopharynx within normal limits. Retropharyngeal soft  tissues normal without acute inflammatory changes. Epiglottis normal. Vallecula clear without discrete mass lesion. Remainder of the hypopharynx and supraglottic larynx within normal limits without discrete mass or other abnormality. Left piriform sinus partially effaced. Pre epiglottic fat maintained. True cords are symmetric and within normal limits. Subglottic airway is clear. Salivary glands: Salivary glands including the parotid and submandibular glands are within normal limits. Thyroid: Thyroid within normal limits. Lymph nodes: No pathologically enlarged lymph nodes identified within the neck. Mildly increased prominence/number of left level 1b nodes, however, these are not pathologic in size or appearance. Vascular: Scattered vascular calcifications noted about the visualized aortic arch and origin of the great vessels without flow-limiting stenosis. Prominent vascular calcification is noted about the carotid bifurcations bilaterally. Concentric calcified noncalcified plaque about the proximal right ICA with mild non flow-limiting stenosis (series 13, image 38). Limited intracranial: Visualized portions of the brain and posterior fossa are unremarkable. Visualized  orbits: Visualized globes and orbital soft tissues within normal limits. Mastoids and visualized paranasal sinuses: Visualized paranasal sinuses are clear. Remote traumatic defect noted at the right lamina for pre shift. Visualized mastoid air cells and middle ear cavities are clear. Skeleton: No acute osseous abnormality. No worrisome lytic or blastic osseous lesions. Mild to moderate degenerative spondylolysis noted at C4-5 through C6-7. Upper chest: Small amount of layering secretions noted within the upper esophageal lumen. Visualized upper chest otherwise within normal limits. Visualized lungs are clear without acute process. Mild centrilobular emphysema. Other: None. IMPRESSION: 1. Mild asymmetric soft tissue fullness at the posterior left mandibular body, near the retromolar trigone without definite discrete or measurable mass lesion. Query this as source of recent biopsy. 2. No other discrete or appreciable mass lesion identified within the oral cavity or elsewhere within the neck. 3. No adenopathy. 4. Moderate carotid bifurcation atherosclerosis. 5. Mild emphysema. Electronically Signed   By: Jeannine Boga M.D.   On: 01/03/2017 19:23      IMPRESSION/PLAN:  This is a delightful patient with head and neck cancer. I recommend he see otolaryngology at [facility] as he might see reconstructive surgery after clearing gross disease.  I also want a dentistry evaluation since I'll likely recommend adjuvant radiotherapy for this patient (pathology pending).   I left a message with Dr Otilio Carpen to verify if he would like to perform this evaluation or if he prefers I refer to Dr Enrique Sack here at [facility].  We discussed the potential risks, benefits, and side effects of adjuvant radiotherapy. We talked in detail about logistics of treatment.  Patient prefers treatment here instead of [facility] hospital which is closer to his [place] home.  No guarantees of treatment were given. We discussed placement of a temporary  feeding tube to keep up his nutrition. The patient expressed interest in pursuing his treatment aggressively. The patient is enthusiastic about proceeding with treatment. I look forward to participating in the patient's care.  I will consent for radiotherapy if it is ultimately recommended post operatively and await referral back to me by [facility].  The patient's case was discussed in tumor board and surgery is recommended as his first line treatment for cure.  Of note, PET scan for further staging work up is scheduled on 01/12/17.  I asked the patient today about tobacco use. The patient uses tobacco.  I advised the patient to quit. Services were offered by me today including outpatient counseling and pharmacotherapy. I assessed for the willingness to attempt to quit and provided encouragement and demonstrated willingness to make referrals  and/or prescriptions to help the patient attempt to quit. The patient has follow-up with the oncologic team to touch base on their tobacco use and /or cessation efforts.  Over 3 minutes were spent on this issue. Planned quit date of 01-08-17 and will purchase lozenges to help. He knows cessation may prevent future cancers and improve wound healing after surgery.   We also discussed that the treatment of head and neck cancer is a multidisciplinary process to maximize treatment outcomes and quality of life. For this reasons the following referrals have been or will be made:  Dentistry for proactive  dental evaluation, possible extractions in the radiation fields, and /or advice on reducing risk of cavities, osteoradionecrosis, or other oral issues. As above, message left for Dr. Otilio Carpen.  ENT at [facility] to discuss surgery.   Other referrals pending, depending on whether RT is given.    [name], MD  This document serves as a record of services personally performed by [name], MD. It was created on her behalf by Darcus Austin, a trained medical scribe.  The creation of this record is based on the scribe's personal observations and the provider's statements to them. This document has been checked and approved by the attending provider.

## 2017-01-05 NOTE — Addendum Note (Signed)
Encounter addended by: Dyquan Minks, Stephani Police, RN on: 01/05/2017  2:31 PM<BR>    Actions taken: Charge Capture section accepted

## 2017-01-06 ENCOUNTER — Encounter (HOSPITAL_COMMUNITY): Payer: Self-pay | Admitting: Dentistry

## 2017-01-06 ENCOUNTER — Ambulatory Visit (HOSPITAL_COMMUNITY): Payer: Self-pay | Admitting: Dentistry

## 2017-01-06 ENCOUNTER — Telehealth: Payer: Self-pay | Admitting: *Deleted

## 2017-01-06 VITALS — BP 117/66 | HR 80 | Temp 98.3°F

## 2017-01-06 DIAGNOSIS — C062 Malignant neoplasm of retromolar area: Secondary | ICD-10-CM

## 2017-01-06 DIAGNOSIS — K0530 Chronic periodontitis, unspecified: Secondary | ICD-10-CM

## 2017-01-06 DIAGNOSIS — M264 Malocclusion, unspecified: Secondary | ICD-10-CM

## 2017-01-06 DIAGNOSIS — K036 Deposits [accretions] on teeth: Secondary | ICD-10-CM

## 2017-01-06 DIAGNOSIS — R252 Cramp and spasm: Secondary | ICD-10-CM

## 2017-01-06 DIAGNOSIS — K06010 Localized gingival recession, unspecified: Secondary | ICD-10-CM

## 2017-01-06 DIAGNOSIS — K030 Excessive attrition of teeth: Secondary | ICD-10-CM

## 2017-01-06 DIAGNOSIS — Z01818 Encounter for other preprocedural examination: Secondary | ICD-10-CM

## 2017-01-06 DIAGNOSIS — K08409 Partial loss of teeth, unspecified cause, unspecified class: Secondary | ICD-10-CM

## 2017-01-06 MED ORDER — SODIUM FLUORIDE 1.1 % DT CREA: TOPICAL_CREAM | DENTAL | 99 refills | Status: DC

## 2017-01-06 NOTE — Progress Notes (Signed)
DENTAL CONSULTATION  Date of Consultation:  01/06/2017 Patient Name:   Colin Wagner Date of Birth:   01-17-1951 Medical Record Number: [id number]  VITALS: BP 117/66 (BP Location: Left Arm)   Pulse 80   Temp 98.3 F (36.8 C) (Oral)   CHIEF COMPLAINT: Patient referred by Dr. Isidore Moos for a dental consultation.  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  The patient had been having discomfort on the left side of his mouth. Patient saw his primary dentist , Dr. Abel Presto in April 2018 and was then referred to an oral surgeon, Dr. Otilio Carpen.  Dr. Otilio Carpen then performed a biopsy. Patient was diagnosed with squamous cell carcinoma of the retromolar trigone at that time. Patient with anticipated surgical resection at [facility] with possible postoperative radiation therapy +/- chemotherapy. Patient is now seen as part of a pre-chemoraiiation therapy dental protocol examination.  The patient currently denies having any toothaches, swellings, or abscesses.  Patient recently had a premolar tooth #20 extracted by an oral surgeon, Dr. Otilio Carpen, on 12/28/2016. Patient has some persistent soreness in this area but this may be coming from the cancer. Patient is usually seen on an every 6 month basis by Dr. Abel Presto in [place], [place]. Patient has been seeing Dr. Abel Presto since 2013. Patient denies having any partial dentures. 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  PROBLEM LIST: Patient Active Problem List   Diagnosis Date Noted  . 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 Rectal bleeding 09/28/2016  . Hyponatremia 04/28/2015  . Abdominal pain 04/27/2015  . Acute pancreatitis 04/27/2015  . Chronic alcoholic pancreatitis ([place]) 04/27/2015  . ETOH abuse 04/27/2015  . DM type 2 (diabetes mellitus, type 2) ([place]) 04/27/2015  . Hypertension, uncontrolled 04/27/2015  . Hyperlipidemia 04/27/2015  . Hypertensive urgency  04/27/2015  . Alcohol abuse     PMH: Past Medical History:  Diagnosis Date  . Alcohol abuse   . Alcoholic pancreatitis 5537   admission  . Chronic back pain   . Chronic pancreatitis ([place])    based on ct findings 2016  . COPD (chronic obstructive pulmonary disease) ([place])   . Diabetes mellitus   . Diverticulosis   . Gastritis   . GERD (gastroesophageal reflux disease)   . Headache   . Hyperlipidemia   . Hypertension   . Peptic ulcer disease 1999   Per medical reports, no H pylori  . Renal cancer, left ([place]) 2012   he tells me that he has been released, ?and that he is free of cancer, and never had it to begin with.   . Right shoulder pain   . Testicular hypofunction     PSH: Past Surgical History:  Procedure Laterality Date  . COLONOSCOPY  2003   Dr. [name], polyps  . COLONOSCOPY  2005   Dr. [name], multiple diverticula  . COLONOSCOPY  2008   Dr. [name], diverticulosis  . COLONOSCOPY WITH PROPOFOL N/A 10/21/2016   Procedure: COLONOSCOPY WITH PROPOFOL;  Surgeon: [name], MD;  [facility]: [facility];  Service: Endoscopy;  Laterality: N/A;  8:30 am  . ESOPHAGOGASTRODUODENOSCOPY     Multiple EGDs. 1999 EGD showed gastric ulcers, no H pylori and benign biopsies performed by Dr. [name]. 2001 gastric ulcer healed. Last EGD 2005 had gastritis.  Marland Kitchen LUNG BIOPSY    . PARTIAL NEPHRECTOMY Left 2012  . POLYPECTOMY  10/21/2016   Procedure: POLYPECTOMY;  Surgeon: [name], MD;  Location: [facility];  Service: Endoscopy;;  hepatic flexure x2    ALLERGIES: No Known Allergies  MEDICATIONS: Current Outpatient Prescriptions  Medication Sig Dispense Refill  . albuterol (PROVENTIL HFA;VENTOLIN HFA) 108 (90 Base) MCG/ACT inhaler Inhale 1-2 puffs into the lungs every 6 (six) hours as needed for wheezing or shortness of breath. 1 Inhaler 0  . B Complex-C (B-COMPLEX WITH VITAMIN C) tablet Take 1 tablet by mouth daily with lunch.    . cetirizine (ZYRTEC) 10 MG  tablet Take 10 mg by mouth at bedtime.   11  . glipiZIDE (GLUCOTROL) 5 MG tablet Take 5 mg by mouth daily as needed (for high blood sugar). PT said he takes it as needed ( if BS gets up to 200 he said)     . HYDROcodone-acetaminophen (NORCO) 7.5-325 MG per tablet Take 1 tablet by mouth 3 (three) times daily as needed. For back, hip, and/or knee pain 20 tablet 0  . ibuprofen (ADVIL,MOTRIN) 200 MG tablet Take 400 mg by mouth every 8 (eight) hours as needed (for pain.).    Marland Kitchen lisinopril (PRINIVIL,ZESTRIL) 5 MG tablet Take 5 mg by mouth every evening.   3  . LORazepam (ATIVAN) 1 MG tablet Take 0.5-1 tablets by mouth 3 (three) times daily as needed for anxiety. For anxiety  5  . metFORMIN (GLUCOPHAGE-XR) 500 MG 24 hr tablet Take 500-1,000 mg by mouth 3 (three) times daily. 2 tabs in the morning, then takes one at lunch and at dinner.  11  . Multiple Vitamin (MULTIVITAMIN WITH MINERALS) TABS tablet Take 1 tablet by mouth daily.    Marland Kitchen omeprazole (PRILOSEC) 20 MG capsule Take 20 mg by mouth 2 (two) times daily.  11  . sildenafil (REVATIO) 20 MG tablet Take 20 mg by mouth 3 (three) times daily. As directed    . tamsulosin (FLOMAX) 0.4 MG CAPS capsule Take 0.4 mg by mouth daily with lunch.     . Testosterone Cypionate 200 MG/ML KIT Inject 200 mg into the muscle every 14 (fourteen) days.    . polyethylene glycol-electrolytes (TRILYTE) 420 g solution Take 4,000 mLs by mouth as directed. 4000 mL 0   No current facility-administered medications for this visit.     LABS: Lab Results  Component Value Date   WBC 6.9 10/18/2016   HGB 14.5 10/18/2016   HCT 42.0 10/18/2016   MCV 82.8 10/18/2016   PLT 260 10/18/2016      Component Value Date/Time   NA 135 10/18/2016 1101   K 4.2 10/18/2016 1101   CL 102 10/18/2016 1101   CO2 27 10/18/2016 1101   GLUCOSE 278 (H) 10/18/2016 1101   BUN 18 10/18/2016 1101   CREATININE 1.90 (H) 01/03/2017 1802   CALCIUM 9.4 10/18/2016 1101   GFRNONAA 55 (L) 10/18/2016 1101    GFRAA >60 10/18/2016 1101   Lab Results  Component Value Date   INR  11/04/2007    1.0 Results Called to:  SURLES,M @ 2114 ON 11/05/07 BY WOODIE,J CORRECTED ON 03/14 AT 2110: PREVIOUSLY REPORTED AS NOT CALCULATED   No results found for: PTT  SOCIAL HISTORY: Social History   Social History  . Marital status: Single    Spouse name: N/A  . Number of children: N/A  . Years of education: N/A   Occupational History  . Not on file.   Social History Main Topics  . Smoking status: Current Some Day Smoker    Packs/day: 0.50  .  Smokeless tobacco: Never Used  . Alcohol use No     Comment: quit 2000 but relapse in 2016. no etoh since hospitalized 2016.   . Drug use: No  . Sexual activity: Not on file   Other Topics Concern  . Not on file   Social History Narrative  . No narrative on file    FAMILY HISTORY: Family History  Problem Relation Age of Onset  . Hypertension Mother   . Colon cancer Neg Hx     REVIEW OF SYSTEMS: Reviewed with the patient as per history of present illness.  Psych: Patient has a history of anxiety. Patient has a history of dental phobia.   DENTAL HISTORY: CHIEF COMPLAINT: Patient referred by Dr. Isidore Moos for a dental consultation.  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  The patient had been having discomfort on the left side of his mouth. Patient saw his primary dentist , Dr. Abel Presto in April 2018 and was then referred to an oral surgeon, Dr. Otilio Carpen.  Dr. Otilio Carpen then performed a biopsy. Patient was diagnosed with squamous cell carcinoma of the retromolar trigone at that time. Patient with anticipated surgical resection at [facility] with possible postoperative radiation therapy +/- chemotherapy. Patient is now seen as part of a pre-chemoraiiation therapy dental protocol examination.  The patient currently denies having any toothaches, swellings, or abscesses.   Patient recently had a premolar tooth #20 extracted by an oral surgeon, Dr. Otilio Carpen, on 12/28/2016. Patient has some persistent soreness in this area but this may be coming from the cancer. Patient is usually seen on an every 6 month basis by Dr. Abel Presto in [place], [place]. Patient has been seeing Dr. Abel Presto since 2013. Patient denies having any partial dentures. 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  DENTAL EXAMINATION: GENERAL: Patient is a well-developed, well-nourished male in no acute distress. HEAD AND NECK: There is no palpable neck lymphadenopathy. Patient has decreased maximum interincisal opening measured today at 15 mm. Patient does have trismus symptoms associated with opening and closing of his mouth. INTRAORAL EXAM: Patient has incipient xerostomia. The left retromolar trigone area is ulcerated and consistent with the cancer diagnosis. The extraction site area #20 appears to be healing in well with no obvious evidence of a dry socket. Patient was instructed to follow-up with Dr. Otilio Carpen for reevaluation of healing of the extraction site #20. There is evidence of maxillary and mandibular incisal and occlusal attrition. DENTITION: Patient is missing tooth numbers 1 through 5, 12 through 16, 17-20, and 32. PERIODONTAL: Patient has chronic periodontitis with minimal plaque accumulations, selective areas of gingival recession and no significant tooth mobility. DENTAL CARIES/SUBOPTIMAL RESTORATIONS: No obvious dental caries are noted. ENDODONTIC: Patient currently denies acute pulpitis symptoms. There is no evidence of periapical pathology or radiolucency. CROWN AND BRIDGE: There are no crown or bridge restorations. PROSTHODONTIC: The patient denies having any partial dentures. OCCLUSION: Patient has a poor occlusal scheme secondary to multiple missing teeth, evidence of occlusal and incisal attrition, and lack of replacement of missing teeth with dental prostheses.  RADIOGRAPHIC  INTERPRETATION: An orthopantogram was taken and supplemented with 8 periapical radiographs. There are multiple missing teeth. There is supra-eruption and drifting of the unopposed teeth into the edentulous areas. There is evidence of recent extraction the area of #20. Multiple dental restorations are noted.  There is incipient to moderate bone loss noted. There is evidence of incisal and occlusal attrition.  ASSESSMENTS: 1. Squamous cell carcinoma of the left retromolar trigone area 2. Pre-chemoradiation therapy dental protocol examination 3. Trismus with decreased maximum interincisal opening 4. Chronic periodontitis with bone loss 5. Accretions-minimal  6. Selective areas of gingival recession 7. Multiple missing teeth 8. Maxillary and mandibular incisal and occlusal attrition 9. No history of partial dentures 10. Poor occlusal scheme but a stable occlusion   PLAN/RECOMMENDATIONS: 1. I discussed the risks, benefits, and complications of various treatment options with the patient in relationship to his medical and dental conditions, anticipated surgical resection, and possible postoperative chemoradiation therapy. We discussed various treatment options to include no treatment, extraction of teeth in the primary field of radiation therapy with alveoloplasty, pre-prosthetic surgery as indicated, periodontal therapy, dental restorations, root canal therapy, crown and bridge therapy, implant therapy, and replacement of missing teeth as indicated. We also discussed fabrication of fluoride trays and scatter protection devices. After review of anticipated ports and doses, I do not feel any additional extractions are needed at this time. The patient will however follow up with Dr. Otilio Carpen for evaluation of healing of the extraction site area #20 due to persistent, nonspecific discomfort in that area. This may be referred pain from the retromolar trigone cancer. The patient has limited maximum interincisal  opening and therefore impressions for fabrication of fluoride trays and scatter protection devices were not attempted at this time. The patient was given a prescription for PreviDent 5000 fluoride therapy to use at bedtime as instructed.  The patient is to return to Dental Medicine after anticipated surgical resection for further evaluation and discussion of impressions for dental appliances at that time.    2. Discussion of findings with medical team and coordination of future medical and dental care as needed.  I spent in excess of  120 minutes during the conduct of this consultation and >50% of this time involved direct face-to-face encounter for counseling and/or coordination of the patient's care.    Lenn Cal, DDS

## 2017-01-06 NOTE — Progress Notes (Signed)
Oncology Nurse Navigator Documentation  Met with Colin Wagner during initial consult with Dr. Isidore Moos.  He was unaccompanied.   1. Further introduced myself as his Navigator, explained my role as a member of the Care Team.   2. Provided New Patient Information packet, discussed contents:  Contact information for physician(s), myself, other members of the Care Team.  Advance Directive information ([employer] blue pamphlet with LCSW contact info)  Fall Prevention Patient [place] sheet  [facility] map with highlight of [place] 3. Provided introductory explanation of radiation treatment including SIM planning and purpose of Aquaplast head and shoulder mask, showed them example.   4. Provided and discussed education handout for PEG.  5. He voiced understanding of: 6. Surgical recommendation, referral to [facility]. 7. Referral to [place]. 8. I encouraged them to contact me with questions/concerns.  He verbalized understanding of information provided.    [name], RN, BSN, [place] Oncology Nurse [place] [facility] [place] [place] [phone]

## 2017-01-06 NOTE — Telephone Encounter (Signed)
CALLED PATIENT TO INFORM OF [place] VISIT WITH DR. Gavin Pound ON 01-31-17 - ARRIVAL TIME - 8:45 AM - WITH DR. Gardiner, [place], [place]. NO. - [phone], SPOKE WITH PATIENT AND HE IS AWARE OF THIS APPT.

## 2017-01-06 NOTE — Patient Instructions (Addendum)
RADIATION THERAPY AND DECISIONS REGARDING YOUR TEETH  Xerostomia (dry mouth) Your salivary glands may be in the filed of radiation.  Radiation may include all or part of your saliva glands.  This will cause your saliva to dry up and you will have a dry mouth.  The dry mouth will be for the rest of your life unless your radiation oncologist tells you otherwise.  Your saliva has many functions:  Saliva wets your tongue for speaking.  It coats your teeth and the inside of your mouth for easier movement.  It helps with chewing and swallowing food.  It helps clean away harmful acid and toxic products made by the germs in your mouth, therefore it helps prevent cavities.  It kills some germs in your mouth and helps to prevent gum disease.  It helps to carry flavor to your taste buds.  Once you have lost your saliva you will be at higher risk for tooth decay and gum disease.  What can be done to help improve your mouth when there's not enough saliva:  1.  Your dentist may give a prescription for Salagen.  It will not bring back all of your saliva but may bring back some of it.  Also your saliva may be thick and ropy or white and foamy. It will not feel like it use to feel.  2.  You will need to swish with water every time your mouth feels dry.  YOU CANNOT suck on any cough drops, mints, lemon drops, candy, vitamin C or any other products.  You cannot use anything other than water to make your mouth feel less dry.  If you want to drink anything else you have to drink it all at once and brush afterwards.  Be sure to discuss the details of your diet habits with your dentist or hygienist.  Radiation caries: This is decay that happens very quickly once your mouth is very dry due to radiation therapy.  Normally cavities take six months to two years to become a problem.  When you have dry mouth cavities may take as little as eight weeks to cause you a problem.  This is why dental check ups every two  months are necessary as long as you have a dry mouth. Radiation caries typically, but not always, start at your gum line where it is hard to see the cavity.  It is therefore also hard to fill these cavities adequately.  This high rate of cavities happens because your mouth no longer has saliva and therefore the acid made by the germs starts the decay process.  Whenever you eat anything the germs in your mouth change the food into acid.  The acid then burns a small hole in your tooth.  This small hole is the beginning of a cavity.  If this is not treated then it will grow bigger and become a cavity.  The way to avoid this hole getting bigger is to use fluoride every evening as prescribed by your dentist.  You have to make sure that your teeth are very clean before you use the fluoride.  This fluoride in turn will strengthen your teeth and prepare them for another day of fighting acid.  If you develop radiation caries many times the damage is so large that you will have to have all your teeth removed.  This could be a big problem if some of these teeth are in the field of radiation.  Further details of why this could be   a big problem will follow.  (See Osteoradionecrosis).  Loss of taste (dysgeusia) This happens to varying degrees once you've had radiation therapy to your jaw region.  Many times taste is not completely lost but becomes limited.  The loss of taste is mostly due to radiation affecting your taste buds.  However if you have no saliva in your mouth to carry the flavor to your taste buds it would be difficult for your taste buds to taste anything.  That is why using water or a prescription for Salagen prior to meals and during meals may help with some of the taste.  Keep in mind that taste generally returns very slowly over the course of several months or several years after radiation therapy.  Don't give up hope.  Trismus According to your Radiation Oncologist your TMJ or jaw joints are going to be  partially or fully in the field of radiation.  This means that over time the muscles that help you open and close your mouth may get stiff.  This will potentially result in your not being able to open your mouth wide enough or as wide as you can open it now.  Le me give you an example of how slowly this happens and how unaware people are of it.  A gentlemen that had radiation therapy two years ago came back to me complaining that bananas are just too large for him to be able to fit them in between his teeth.  He was not able to open wide enough to bite into a banana.  This happens slowly and over a period of time.  What do we do to try and prevent this?  Your dentist will probably give you a stack of sticks called a trismus exercise device .  This stack will help your remind your muscles and your jaw joint to open up to the same distance every day.  Use these sticks every morning when you wake up according to the instructions given by the dentist.   You must use these sticks for at least one to two years after radiation therapy.  The reason for that is because it happens so slowly and keeps going on for about two years after radiation therapy.  Your hospital dentist will help you monitor your mouth opening and make sure that it's not getting smaller.  Osteoradionecrosis (ORN) This is a condition where your jaw bone after having had radiation therapy becomes very dry.  It has very little blood supply to keep it alive.  If you develop a cavity that turns into an abscess or an infection then the jaw bone does not have enough blood supply to help fight the infection.  At this point it is very likely that the infection could cause the death of your jaw bone.  When you have dead bone it has to be removed.  Therefore you might end up having to have surgery to remove part of your jaw bone, the part of the jaw bone that has been affected.   Healing is also a problem if you are to have surgery in the areas where the bone  has had radiation therapy.  The same reasons apply.  If you have surgery you need more blood supply which is not available.  When blood supply and oxygen are not available again, there is a chance for the bone to die.  Occasionally ORN happens on its own with no obvious reason.  This is quite rare.  We believe that   patients who continue to smoke and/or drink alcohol have a higher chance of having this bone problem.  Therefore once your jaw bone has had radiation therapy if there are any teeth in that area, you should never have them pulled.  You should also never have any surgery on your teeth or gums in that area unless the oral surgeon or Periodontist is aware of your history of radiation. There is some expensive management techniques that might be used to limit your risks.  The risks for ORN either from infection or spontaneous ( or on it's own) are life long.     

## 2017-01-12 ENCOUNTER — Other Ambulatory Visit: Payer: Self-pay | Admitting: Radiation Oncology

## 2017-01-12 ENCOUNTER — Telehealth: Payer: Self-pay | Admitting: *Deleted

## 2017-01-12 ENCOUNTER — Ambulatory Visit (HOSPITAL_COMMUNITY)
Admission: RE | Admit: 2017-01-12 | Discharge: 2017-01-12 | Disposition: A | Discharge status: Home / Self Care | Payer: Medicare Other | Source: Ambulatory Visit | Attending: Family Medicine | Admitting: Family Medicine

## 2017-01-12 DIAGNOSIS — J439 Emphysema, unspecified: Secondary | ICD-10-CM | POA: Diagnosis not present

## 2017-01-12 DIAGNOSIS — I700 Atherosclerosis of aorta: Secondary | ICD-10-CM | POA: Insufficient documentation

## 2017-01-12 DIAGNOSIS — K5730 Diverticulosis of large intestine without perforation or abscess without bleeding: Secondary | ICD-10-CM | POA: Diagnosis not present

## 2017-01-12 DIAGNOSIS — C069 Malignant neoplasm of mouth, unspecified: Secondary | ICD-10-CM | POA: Diagnosis present

## 2017-01-12 DIAGNOSIS — K861 Other chronic pancreatitis: Secondary | ICD-10-CM | POA: Diagnosis not present

## 2017-01-12 LAB — GLUCOSE, CAPILLARY: GLUCOSE-CAPILLARY: 171 mg/dL — AB (ref 65–99)

## 2017-01-12 MED ORDER — HYDROCODONE-ACETAMINOPHEN 7.5-325 MG PO TABS: 1.0000 | ORAL_TABLET | Freq: Four times a day (QID) | ORAL | 0 refills | Status: DC | PRN

## 2017-01-12 MED ORDER — SENNA 8.6 MG PO TABS: 1.0000 | ORAL_TABLET | Freq: Every evening | ORAL | 1 refills | Status: DC | PRN

## 2017-01-12 MED ORDER — FLUDEOXYGLUCOSE F - 18 (FDG) INJECTION
8.6900 | Freq: Once | INTRAVENOUS | Status: AC | PRN
  Administered 2017-01-12: 8.69 via INTRAVENOUS

## 2017-01-12 NOTE — Telephone Encounter (Signed)
Voicemail from male received requesting we "call this patient, [phone]."  Returned call.  "I see Dr.Squire.  I have pain in the jaw area where the tumor is.  The ibuprofen is not working."

## 2017-02-02 ENCOUNTER — Other Ambulatory Visit: Payer: Self-pay | Admitting: Radiation Oncology

## 2017-03-01 HISTORY — PX: OTHER SURGICAL HISTORY: SHX169

## 2017-03-11 ENCOUNTER — Emergency Department (HOSPITAL_COMMUNITY)
Admission: EM | Admit: 2017-03-11 | Discharge: 2017-03-11 | Disposition: A | Discharge status: Home / Self Care | Payer: Medicare Other | Attending: Emergency Medicine | Admitting: Emergency Medicine

## 2017-03-11 ENCOUNTER — Encounter (HOSPITAL_COMMUNITY): Payer: Self-pay | Admitting: Emergency Medicine

## 2017-03-11 ENCOUNTER — Emergency Department (HOSPITAL_COMMUNITY): Payer: Medicare Other

## 2017-03-11 DIAGNOSIS — J9509 Other tracheostomy complication: Secondary | ICD-10-CM | POA: Insufficient documentation

## 2017-03-11 DIAGNOSIS — I10 Essential (primary) hypertension: Secondary | ICD-10-CM | POA: Insufficient documentation

## 2017-03-11 DIAGNOSIS — J449 Chronic obstructive pulmonary disease, unspecified: Secondary | ICD-10-CM | POA: Insufficient documentation

## 2017-03-11 DIAGNOSIS — Z7984 Long term (current) use of oral hypoglycemic drugs: Secondary | ICD-10-CM | POA: Insufficient documentation

## 2017-03-11 DIAGNOSIS — E119 Type 2 diabetes mellitus without complications: Secondary | ICD-10-CM | POA: Insufficient documentation

## 2017-03-11 DIAGNOSIS — Z794 Long term (current) use of insulin: Secondary | ICD-10-CM | POA: Insufficient documentation

## 2017-03-11 DIAGNOSIS — F17210 Nicotine dependence, cigarettes, uncomplicated: Secondary | ICD-10-CM | POA: Diagnosis not present

## 2017-03-11 DIAGNOSIS — R05 Cough: Secondary | ICD-10-CM | POA: Insufficient documentation

## 2017-03-11 DIAGNOSIS — T888XXA Other specified complications of surgical and medical care, not elsewhere classified, initial encounter: Secondary | ICD-10-CM

## 2017-03-11 DIAGNOSIS — Z85818 Personal history of malignant neoplasm of other sites of lip, oral cavity, and pharynx: Secondary | ICD-10-CM | POA: Insufficient documentation

## 2017-03-11 LAB — CBC WITH DIFFERENTIAL/PLATELET
BASOS ABS: 0 10*3/uL (ref 0.0–0.1)
Basophils Relative: 0 %
EOS PCT: 2 %
Eosinophils Absolute: 0.3 10*3/uL (ref 0.0–0.7)
HCT: 24.5 % — ABNORMAL LOW (ref 39.0–52.0)
Hemoglobin: 8.1 g/dL — ABNORMAL LOW (ref 13.0–17.0)
LYMPHS PCT: 12 %
Lymphs Abs: 1.6 10*3/uL (ref 0.7–4.0)
MCH: 27.3 pg (ref 26.0–34.0)
MCHC: 33.1 g/dL (ref 30.0–36.0)
MCV: 82.5 fL (ref 78.0–100.0)
MONO ABS: 0.9 10*3/uL (ref 0.1–1.0)
Monocytes Relative: 7 %
Neutro Abs: 10.4 10*3/uL — ABNORMAL HIGH (ref 1.7–7.7)
Neutrophils Relative %: 79 %
PLATELETS: 670 10*3/uL — AB (ref 150–400)
RBC: 2.97 MIL/uL — ABNORMAL LOW (ref 4.22–5.81)
RDW: 16.3 % — AB (ref 11.5–15.5)
WBC: 13.2 10*3/uL — ABNORMAL HIGH (ref 4.0–10.5)

## 2017-03-11 LAB — COMPREHENSIVE METABOLIC PANEL
ALT: 12 U/L — ABNORMAL LOW (ref 17–63)
AST: 15 U/L (ref 15–41)
Albumin: 2.6 g/dL — ABNORMAL LOW (ref 3.5–5.0)
Alkaline Phosphatase: 70 U/L (ref 38–126)
Anion gap: 12 (ref 5–15)
BUN: 25 mg/dL — AB (ref 6–20)
CHLORIDE: 99 mmol/L — AB (ref 101–111)
CO2: 28 mmol/L (ref 22–32)
Calcium: 9.2 mg/dL (ref 8.9–10.3)
Creatinine, Ser: 1.44 mg/dL — ABNORMAL HIGH (ref 0.61–1.24)
GFR, EST AFRICAN AMERICAN: 57 mL/min — AB (ref >60)
GFR, EST NON AFRICAN AMERICAN: 49 mL/min — AB (ref >60)
Glucose, Bld: 76 mg/dL (ref 65–99)
POTASSIUM: 4.6 mmol/L (ref 3.5–5.1)
Sodium: 139 mmol/L (ref 135–145)
Total Bilirubin: 0.4 mg/dL (ref 0.3–1.2)
Total Protein: 7.2 g/dL (ref 6.5–8.1)

## 2017-03-11 LAB — LACTIC ACID, PLASMA: LACTIC ACID, VENOUS: 1.2 mmol/L (ref 0.5–1.9)

## 2017-03-11 MED ORDER — LACTATED RINGERS IV BOLUS (SEPSIS)
1000.0000 mL | Freq: Once | INTRAVENOUS | Status: AC
  Administered 2017-03-11: 1000 mL via INTRAVENOUS

## 2017-03-11 MED ORDER — IOPAMIDOL (ISOVUE-300) INJECTION 61%
100.0000 mL | Freq: Once | INTRAVENOUS | Status: AC | PRN
  Administered 2017-03-11: 100 mL via INTRAVENOUS

## 2017-03-11 NOTE — ED Provider Notes (Signed)
[facility] DEPT Provider Note   CSN: [id number] Arrival date & time: 03/11/17  1202     History   Chief Complaint Chief Complaint  Patient presents with  . Tracheostomy Tube Change    HPI SHIGEO BAUGH is a 66 y.o. male.  Was coughing and dislodged trach tube just PTA. otherwise has been at baseline since recent squamous cell cancer resection. Has had some coughing and production with the cough, but no change. Has had improved swelling about the neck and jaw. Mild erythema in that area has improved as well. No other associated or modifying factors.       Past Medical History:  Diagnosis Date  . Alcohol abuse   . Alcoholic pancreatitis 3557   admission  . Chronic back pain   . Chronic pancreatitis ([place])    based on ct findings 2016  . COPD (chronic obstructive pulmonary disease) ([place])   . Diabetes mellitus   . Diverticulosis   . Gastritis   . GERD (gastroesophageal reflux disease)   . Headache   . Hyperlipidemia   . Hypertension   . Peptic ulcer disease 1999   Per medical reports, no H pylori  . Renal cancer, left ([place]) 2012   he tells me that he has been released, ?and that he is free of cancer, and never had it to begin with.   . Right shoulder pain   . Testicular hypofunction     Patient Active Problem List   Diagnosis Date Noted  . 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 Rectal bleeding 09/28/2016  . Hyponatremia 04/28/2015  . Abdominal pain 04/27/2015  . Acute pancreatitis 04/27/2015  . 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 ETOH abuse 04/27/2015  . DM type 2 (diabetes mellitus, type 2) ([place]) 04/27/2015  . Hypertension, uncontrolled 04/27/2015  . Hyperlipidemia 04/27/2015  . Hypertensive urgency 04/27/2015  . Alcohol abuse     Past Surgical History:  Procedure Laterality Date  . COLONOSCOPY  2003   Dr. [name], polyps  . COLONOSCOPY  2005   Dr. [name], multiple diverticula  . COLONOSCOPY  2008   Dr. [name],  diverticulosis  . COLONOSCOPY WITH PROPOFOL N/A 10/21/2016   Procedure: COLONOSCOPY WITH PROPOFOL;  Surgeon: [name], MD;  [facility];  Service: Endoscopy;  Laterality: N/A;  8:30 am  . ESOPHAGOGASTRODUODENOSCOPY     Multiple EGDs. 1999 EGD showed gastric ulcers, no H pylori and benign biopsies performed by Dr. [name]. 2001 gastric ulcer healed. Last EGD 2005 had gastritis.  Marland Kitchen LUNG BIOPSY    . PARTIAL NEPHRECTOMY Left 2012  . POLYPECTOMY  10/21/2016   Procedure: POLYPECTOMY;  Surgeon: [name], MD;  Location[facility];  Service: Endoscopy;;  hepatic flexure x2       Home Medications    Prior to Admission medications   Medication Sig Start Date End Date Taking? Authorizing Provider  Acetaminophen (TYLENOL 8 HOUR PO) Take 2 tablets by mouth 2 (two) times daily as needed.   Yes [provider]  albuterol (PROVENTIL HFA;VENTOLIN HFA) 108 (90 Base) MCG/ACT inhaler Inhale 1-2 puffs into the lungs every 6 (six) hours as needed for wheezing or shortness of breath. 09/25/16  Yes Nat Christen, MD  B Complex-C (B-COMPLEX WITH VITAMIN C) tablet Take 1 tablet by mouth daily with lunch.   Yes [provider]  cetirizine (ZYRTEC) 10 MG tablet Take 10 mg by mouth at bedtime.  03/31/15  Yes [provider]  glipiZIDE (GLUCOTROL) 5 MG tablet Take 5 mg by mouth daily as needed (for high blood sugar). PT said he takes it as needed ( if BS gets up to 200 he said)    Yes [provider]  HYDROcodone-acetaminophen (NORCO) 7.5-325 MG tablet Take 1 tablet by mouth every 6 (six) hours as needed (pain). 01/12/17  Yes [name], MD  ibuprofen (ADVIL,MOTRIN) 200 MG tablet Take 400 mg by mouth every 8 (eight) hours as needed (for pain.).   Yes [provider]  lisinopril (PRINIVIL,ZESTRIL) 5 MG tablet Take 5 mg by mouth every evening.  04/02/15  Yes [provider]  LORazepam (ATIVAN) 1 MG tablet Take 0.5-1 tablets by mouth 3 (three)  times daily as needed for anxiety. For anxiety 03/31/15  Yes [provider]  metFORMIN (GLUCOPHAGE-XR) 500 MG 24 hr tablet Take 500-1,000 mg by mouth 3 (three) times daily. 2 tabs in the morning, then takes one at lunch and at dinner. 04/02/15  Yes [provider]  Multiple Vitamin (MULTIVITAMIN WITH MINERALS) TABS tablet Take 1 tablet by mouth daily. 04/29/15  Yes Isaac Bliss, Rayford Halsted, MD  oxyCODONE (ROXICODONE) 5 MG immediate release tablet Take 10 mg by mouth every 4 (four) hours as needed for severe pain.   Yes [provider]  pantoprazole (PROTONIX) 20 MG tablet Take 20 mg by mouth daily. Unsure of dose. Will call   Yes [provider]  senna (SENOKOT) 8.6 MG TABS tablet Take 1 tablet (8.6 mg total) by mouth at bedtime as needed for mild constipation. May be needed as pain medication can constipate. 01/12/17  Yes [name], MD  tamsulosin (FLOMAX) 0.4 MG CAPS capsule Take 0.4 mg by mouth daily with lunch.    Yes [provider]  LANTUS 100 UNIT/ML injection 25 Units every evening. 03/09/17   [provider]  NOVOLOG 100 UNIT/ML injection 5 Units every 4 (four) hours. 03/09/17   [provider]  polyethylene glycol-electrolytes (TRILYTE) 420 g solution Take 4,000 mLs by mouth as directed. 09/28/16   [name], MD  sildenafil (REVATIO) 20 MG tablet Take 20 mg by mouth 3 (three) times daily. As directed    [provider]  sodium fluoride (PREVIDENT 5000 PLUS) 1.1 % CREA dental cream Apply to tooth brush. Brush teeth for 2 minutes. Spit out excess-DO NOT swallow. Repeat nightly. Patient not taking: Reported on 03/11/2017 01/06/17   Lenn Cal, DDS    Family History Family History  Problem Relation Age of Onset  . Hypertension Mother   . Colon cancer Neg Hx     Social History Social History  Substance Use Topics  . Smoking status: Current Some Day Smoker    Packs/day: 0.50    Years: 35.00  . Smokeless  tobacco: Never Used     Comment: Only smoking a few cigarettes now  . Alcohol use No     Comment: quit 2000 but relapse in 2016. no etoh since hospitalized 2016.      Allergies   Patient has no known allergies.   Review of Systems Review of Systems  Unable to perform ROS: Patient nonverbal     Physical Exam Updated Vital Signs BP 129/63   Pulse 96   Temp 98.5 F (36.9 C)   Resp 18   Ht 5\' 11"  (1.803 m)   Wt 80.3 kg (177 lb)   SpO2 93%   BMI 24.69 kg/m   Physical Exam  Constitutional: He is  oriented to person, place, and time. He appears well-developed and well-nourished.  HENT:  Head: Normocephalic and atraumatic.  Well healing incision through bottom lip and chin. Swelling to left jaw and neck reportedly improved.  Stapled wound to left and anterior neck with mild erythema but no drainage, ttp or worsening swelling.  Had some purulent appearing discharge at one point from just above trach tube but not consistently.   Eyes: Conjunctivae and EOM are normal.  Neck: Normal range of motion.  Cardiovascular: Normal rate.   Pulmonary/Chest: Effort normal. No respiratory distress. He has rhonchi. He has rales.  Abdominal: Soft. He exhibits no distension.  Musculoskeletal: Normal range of motion. He exhibits no edema or deformity.  Neurological: He is alert and oriented to person, place, and time. No cranial nerve deficit. Coordination normal.  Skin: Skin is warm and dry.  Nursing note and vitals reviewed.    ED Treatments / Results  Labs (all labs ordered are listed, but only abnormal results are displayed) Labs Reviewed  CBC WITH DIFFERENTIAL/PLATELET - Abnormal; Notable for the following:       Result Value   WBC 13.2 (*)    RBC 2.97 (*)    Hemoglobin 8.1 (*)    HCT 24.5 (*)    RDW 16.3 (*)    Platelets 670 (*)    Neutro Abs 10.4 (*)    All other components within normal limits  COMPREHENSIVE METABOLIC PANEL - Abnormal; Notable for the following:    Chloride  99 (*)    BUN 25 (*)    Creatinine, Ser 1.44 (*)    Albumin 2.6 (*)    ALT 12 (*)    GFR calc non Af Amer 49 (*)    GFR calc Af Amer 57 (*)    All other components within normal limits  LACTIC ACID, PLASMA    EKG  EKG Interpretation None       Radiology Ct Soft Tissue Neck W Contrast  Result Date: 03/11/2017 CLINICAL DATA:  Tracheostomy repositioning. Recent surgery for head and neck cancer. EXAM: CT NECK WITH CONTRAST TECHNIQUE: Multidetector CT imaging of the neck was performed using the standard protocol following the bolus administration of intravenous contrast. CONTRAST:  75 cc Isovue-300 COMPARISON:  Preoperative study 01/03/2017 and 01/12/2017 FINDINGS: Pharynx and larynx: Recent resection in the left retromolar trigone region. Pronounced soft tissue edema in that region as expected in the immediate postoperative time frame. Left neck dissection with multiple surgical clips, small amount of air and edema. Segmental left mandibular resection with bone graft taken from the scapula. Nasogastric feeding tube in place. Tracheostomy well positioned. Small amount of edema in the anterior superior mediastinum inferior to the tracheostomy. Salivary glands: Right submandibular and parotid gland appear normal. Left submandibular gland not definitely seen. Left parotid gland surrounded by postoperative edema but otherwise unremarkable. Thyroid: Negative Lymph nodes: No sign of enlarged nodes. Extensive postoperative changes on the left preclude accurate nodal evaluation. Vascular: No vascular complications seen. Limited intracranial: Negative Visualized orbits: Negative Mastoids and visualized paranasal sinuses: Small amount of layering fluid in the left maxillary sinus. Segmental resection of the posterior inferior aspect of the left maxillary sinus and a portion of the maxilla. Skeleton: No other finding of significance. Upper chest: See results of chest CT. Other: None significant IMPRESSION:  Tracheostomy well positioned. Small amount of edema in the anterior superior mediastinum inferior to the tracheostomy. Extensive local resection of the left mouth and face and left neck dissection. See above  discussion. Soft tissue changes typical of the immediate postoperative time frame. No definable operative complication. Electronically Signed   By: Nelson Chimes M.D.   On: 03/11/2017 15:18   Ct Chest W Contrast  Result Date: 03/11/2017 CLINICAL DATA:  Coughing. Partial displacement of tracheostomy. Reason surgery for cancer. EXAM: CT CHEST WITH CONTRAST TECHNIQUE: Multidetector CT imaging of the chest was performed during intravenous contrast administration. CONTRAST:  75 cc Isovue-300 COMPARISON:  12/01/2015 FINDINGS: Cardiovascular: Aortic atherosclerosis. Coronary artery calcification. Normal heart size. No pericardial fluid. Mediastinum/Nodes: No hilar or mediastinal mass or lymphadenopathy. Mild edema of the anterior superior mediastinum just inferior to the tracheostomy. Lungs/Pleura: Mild patchy volume loss in both lower lobes could be simple atelectasis or mild pneumonia/ aspiration. Emphysematous changes are noted in the upper lobes. Previously seen 4 mm subpleural nodule in the left lower lobe is unchanged and continued likely benign. Upper Abdomen: Elevation of the left hemidiaphragm. Feeding tube within the stomach. The left renal cyst. No acute abdominal finding. Musculoskeletal: No significant spinal finding. Drainage catheter within the soft tissues of the left chest posteriorly. IMPRESSION: Tracheostomy appears well positioned. There is a small region of edema inferior to the tracheostomy in the superior mediastinum which is nonspecific and could relate to recent manipulation. Areas of density in both lower lobes consistent with atelectasis or atelectatic pneumonia. The majority of the lungs are clear. No change in a 4 mm subpleural nodule in the left lower lung, benign. Aortic  Atherosclerosis (ICD10-I70.0) and Emphysema (ICD10-J43.9). Electronically Signed   By: Nelson Chimes M.D.   On: 03/11/2017 15:11    Procedures Procedures (including critical care time)  Medications Ordered in ED Medications  lactated ringers bolus 1,000 mL (1,000 mLs Intravenous New Bag/Given 03/11/17 1335)  iopamidol (ISOVUE-300) 61 % injection 100 mL (100 mLs Intravenous Contrast Given 03/11/17 1523)     Initial Impression / Assessment and Plan / ED Course  I have reviewed the triage vital signs and the nursing notes.  Pertinent labs & imaging results that were available during my care of the patient were reviewed by me and considered in my medical decision making (see chart for details).     Trach replaced without complication. Afebrile. Suspected possible infection but ct of neck/chest without any  Obvious post op complications or fluid collections. As his girlfriend states that he has had no changes and negative CT with normal VS I doubt the drainage I am seeing is from an infection or mucus plug. He is satting appropriately and is stable for discharge at this time.   Final Clinical Impressions(s) / ED Diagnoses   Final diagnoses:  Difficulty with tracheal extubation      Damya Comley, Corene Cornea, MD 03/11/17 1627

## 2017-03-11 NOTE — Progress Notes (Signed)
**Note De-Identified Colin Wagner Obfuscation** Patient #6 CFS Shiley trach partially dislodged due to increased coughing breaking top sutures. Removed remaining sutures and replaced trach; + ETCO2.  Cleaned  And placed trach tie.  RRT to continue to monitor.

## 2017-03-11 NOTE — ED Notes (Signed)
RT at bedside to clean, suction and secure trach.

## 2017-03-11 NOTE — ED Triage Notes (Signed)
Pt reports coughing and having his trach partially eject.  Pt has copious green mucus from trach.  Recent sx for cancer.

## 2017-03-13 ENCOUNTER — Encounter (HOSPITAL_COMMUNITY): Payer: Self-pay | Admitting: *Deleted

## 2017-03-13 ENCOUNTER — Emergency Department (HOSPITAL_COMMUNITY)
Admission: EM | Admit: 2017-03-13 | Discharge: 2017-03-13 | Disposition: A | Discharge status: Home / Self Care | Payer: Medicare Other | Attending: Emergency Medicine | Admitting: Emergency Medicine

## 2017-03-13 DIAGNOSIS — Y658 Other specified misadventures during surgical and medical care: Secondary | ICD-10-CM | POA: Insufficient documentation

## 2017-03-13 DIAGNOSIS — Z79899 Other long term (current) drug therapy: Secondary | ICD-10-CM | POA: Diagnosis not present

## 2017-03-13 DIAGNOSIS — R339 Retention of urine, unspecified: Secondary | ICD-10-CM | POA: Diagnosis not present

## 2017-03-13 DIAGNOSIS — E119 Type 2 diabetes mellitus without complications: Secondary | ICD-10-CM | POA: Insufficient documentation

## 2017-03-13 DIAGNOSIS — J449 Chronic obstructive pulmonary disease, unspecified: Secondary | ICD-10-CM | POA: Diagnosis not present

## 2017-03-13 DIAGNOSIS — Z87891 Personal history of nicotine dependence: Secondary | ICD-10-CM | POA: Diagnosis not present

## 2017-03-13 DIAGNOSIS — T83098A Other mechanical complication of other indwelling urethral catheter, initial encounter: Secondary | ICD-10-CM | POA: Insufficient documentation

## 2017-03-13 DIAGNOSIS — Z7984 Long term (current) use of oral hypoglycemic drugs: Secondary | ICD-10-CM | POA: Insufficient documentation

## 2017-03-13 DIAGNOSIS — Z85048 Personal history of other malignant neoplasm of rectum, rectosigmoid junction, and anus: Secondary | ICD-10-CM | POA: Insufficient documentation

## 2017-03-13 DIAGNOSIS — T839XXA Unspecified complication of genitourinary prosthetic device, implant and graft, initial encounter: Secondary | ICD-10-CM

## 2017-03-13 LAB — URINALYSIS, ROUTINE W REFLEX MICROSCOPIC
BACTERIA UA: NONE SEEN
Bilirubin Urine: NEGATIVE
Glucose, UA: NEGATIVE mg/dL
Ketones, ur: NEGATIVE mg/dL
Nitrite: NEGATIVE
PROTEIN: 30 mg/dL — AB
SPECIFIC GRAVITY, URINE: 1.012 (ref 1.005–1.030)
SQUAMOUS EPITHELIAL / LPF: NONE SEEN
pH: 7 (ref 5.0–8.0)

## 2017-03-13 NOTE — ED Triage Notes (Signed)
Pt reports that he had a foley in place today and it was pulled out, c/o not being able to urinate,

## 2017-03-13 NOTE — ED Provider Notes (Signed)
[facility] DEPT Provider Note   CSN: [id number] Arrival date & time: 03/13/17  2112     History   Chief Complaint Chief Complaint  Patient presents with  . Urinary Retention    HPI Colin Wagner is a 66 y.o. male.  The history is provided by the patient. No language interpreter was used.  Urinary Frequency  This is a new problem. The current episode started 6 to 12 hours ago. The problem has not changed since onset.Nothing aggravates the symptoms. Nothing relieves the symptoms. Colin Wagner has tried nothing for the symptoms.  Pt was discharged from the hospital with a foley.  Pt took foley out today because Colin Wagner thought Colin Wagner could pee without it.  Pt reports Colin Wagner is unable to urinate  Past Medical History:  Diagnosis Date  . Alcohol abuse   . Alcoholic pancreatitis 3149   admission  . Chronic back pain   . Chronic pancreatitis ([place])    based on ct findings 2016  . COPD (chronic obstructive pulmonary disease) ([place])   . Diabetes mellitus   . Diverticulosis   . Gastritis   . GERD (gastroesophageal reflux disease)   . Headache   . Hyperlipidemia   . Hypertension   . Peptic ulcer disease 1999   Per medical reports, no H pylori  . Renal cancer, left ([place]) 2012   Colin Wagner tells me that Colin Wagner has been released, ?and that Colin Wagner is free of cancer, and never had it to begin with.   . Right shoulder pain   . Testicular hypofunction     Patient Active Problem List   Diagnosis Date Noted  . 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 Rectal bleeding 09/28/2016  . Hyponatremia 04/28/2015  . Abdominal pain 04/27/2015  . Acute pancreatitis 04/27/2015  . Chronic alcoholic pancreatitis ([place]) 04/27/2015  . ETOH abuse 04/27/2015  . DM type 2 (diabetes mellitus, type 2) ([place]) 04/27/2015  . Hypertension, uncontrolled 04/27/2015  . Hyperlipidemia 04/27/2015  . Hypertensive urgency 04/27/2015  . Alcohol abuse     Past Surgical History:  Procedure Laterality Date  . COLONOSCOPY  2003   Dr.  [name], polyps  . COLONOSCOPY  2005   Dr. [name], multiple diverticula  . COLONOSCOPY  2008   Dr. [name], diverticulosis  . COLONOSCOPY WITH PROPOFOL N/A 10/21/2016   Procedure: COLONOSCOPY WITH PROPOFOL;  Surgeon: [name], MD;  [facility];  Service: Endoscopy;  Laterality: N/A;  8:30 am  . ESOPHAGOGASTRODUODENOSCOPY     Multiple EGDs. 1999 EGD showed gastric ulcers, no H pylori and benign biopsies performed by Dr. [name]. 2001 gastric ulcer healed. Last EGD 2005 had gastritis.  Marland Kitchen LUNG BIOPSY    . PARTIAL NEPHRECTOMY Left 2012  . POLYPECTOMY  10/21/2016   Procedure: POLYPECTOMY;  Surgeon: [name], MD;  [facility];  Service: Endoscopy;;  hepatic flexure x2       Home Medications    Prior to Admission medications   Medication Sig Start Date End Date Taking? Authorizing Provider  Acetaminophen (TYLENOL 8 HOUR PO) Take 2 tablets by mouth 2 (two) times daily as needed.    [provider]  albuterol (PROVENTIL HFA;VENTOLIN HFA) 108 (90 Base) MCG/ACT inhaler Inhale 1-2 puffs into the lungs every 6 (six) hours as needed for wheezing or shortness of breath. 09/25/16   Nat Christen, MD  B Complex-C (B-COMPLEX WITH VITAMIN C) tablet Take 1 tablet by mouth daily with lunch.  [provider]  cetirizine (ZYRTEC) 10 MG tablet Take 10 mg by mouth at bedtime.  03/31/15   [provider]  glipiZIDE (GLUCOTROL) 5 MG tablet Take 5 mg by mouth daily as needed (for high blood sugar). PT said Colin Wagner takes it as needed ( if BS gets up to 200 Colin Wagner said)     [provider]  HYDROcodone-acetaminophen (NORCO) 7.5-325 MG tablet Take 1 tablet by mouth every 6 (six) hours as needed (pain). 01/12/17   [name], MD  ibuprofen (ADVIL,MOTRIN) 200 MG tablet Take 400 mg by mouth every 8 (eight) hours as needed (for pain.).    [provider]  LANTUS 100 UNIT/ML injection 25 Units every evening. 03/09/17   [provider]    lisinopril (PRINIVIL,ZESTRIL) 5 MG tablet Take 5 mg by mouth every evening.  04/02/15   [provider]  LORazepam (ATIVAN) 1 MG tablet Take 0.5-1 tablets by mouth 3 (three) times daily as needed for anxiety. For anxiety 03/31/15   [provider]  metFORMIN (GLUCOPHAGE-XR) 500 MG 24 hr tablet Take 500-1,000 mg by mouth 3 (three) times daily. 2 tabs in the morning, then takes one at lunch and at dinner. 04/02/15   [provider]  Multiple Vitamin (MULTIVITAMIN WITH MINERALS) TABS tablet Take 1 tablet by mouth daily. 04/29/15   Isaac Bliss, Rayford Halsted, MD  NOVOLOG 100 UNIT/ML injection 5 Units every 4 (four) hours. 03/09/17   [provider]  oxyCODONE (ROXICODONE) 5 MG immediate release tablet Take 10 mg by mouth every 4 (four) hours as needed for severe pain.    [provider]  pantoprazole (PROTONIX) 20 MG tablet Take 20 mg by mouth daily. Unsure of dose. Will call    [provider]  polyethylene glycol-electrolytes (TRILYTE) 420 g solution Take 4,000 mLs by mouth as directed. 09/28/16   [name], MD  senna (SENOKOT) 8.6 MG TABS tablet Take 1 tablet (8.6 mg total) by mouth at bedtime as needed for mild constipation. May be needed as pain medication can constipate. 01/12/17   [name], MD  sildenafil (REVATIO) 20 MG tablet Take 20 mg by mouth 3 (three) times daily. As directed    [provider]  sodium fluoride (PREVIDENT 5000 PLUS) 1.1 % CREA dental cream Apply to tooth brush. Brush teeth for 2 minutes. Spit out excess-DO NOT swallow. Repeat nightly. Patient not taking: Reported on 03/11/2017 01/06/17   Lenn Cal, DDS  tamsulosin (FLOMAX) 0.4 MG CAPS capsule Take 0.4 mg by mouth daily with lunch.     [provider]    Family History Family History  Problem Relation Age of Onset  . Hypertension Mother   . Colon cancer Neg Hx     Social History Social History  Substance Use Topics  . Smoking status:  Former Smoker    Packs/day: 0.50    Years: 35.00  . Smokeless tobacco: Never Used     Comment: Only smoking a few cigarettes now  . Alcohol use No     Comment: quit 2000 but relapse in 2016. no etoh since hospitalized 2016.      Allergies   Patient has no known allergies.   Review of Systems Review of Systems  Genitourinary: Positive for frequency.  All other systems reviewed and are negative.    Physical Exam Updated Vital Signs BP (!) 125/54   Pulse (!) 109   Temp 98.4 F (36.9 C) (Oral)   Resp (!) 22  Ht 5\' 11"  (1.803 m)   Wt 80.7 kg (178 lb)   SpO2 93%   BMI 24.83 kg/m   Physical Exam  Constitutional: Colin Wagner appears well-developed and well-nourished.  HENT:  Head: Normocephalic.  Right Ear: External ear normal.  Left Ear: External ear normal.  Mouth/Throat: Oropharynx is clear and moist.  Eyes: Pupils are equal, round, and reactive to light.  Neck: Normal range of motion.  Cardiovascular: Normal rate and regular rhythm.   Pulmonary/Chest: Effort normal.  Abdominal: Soft.  Musculoskeletal: Normal range of motion.  Neurological: Colin Wagner is alert.  Skin: Skin is warm.  Psychiatric: Colin Wagner has a normal mood and affect.  Nursing note and vitals reviewed.    ED Treatments / Results  Labs (all labs ordered are listed, but only abnormal results are displayed) Labs Reviewed  URINALYSIS, ROUTINE W REFLEX MICROSCOPIC    EKG  EKG Interpretation None       Radiology No results found.  Procedures Procedures (including critical care time)  Medications Ordered in ED Medications - No data to display   Initial Impression / Assessment and Plan / ED Course  I have reviewed the triage vital signs and the nursing notes.  Pertinent labs & imaging results that were available during my care of the patient were reviewed by me and considered in my medical decision making (see chart for details).     Foley replaced.  Pt had 1000cc of urine out.  Leg bag placed.  Pt  advised to keep follow up appointments.  Leave foley in until seen by urologyu  Final Clinical Impressions(s) / ED Diagnoses   Final diagnoses:  Urinary retention  Complication of Foley catheter, initial encounter [facility])    New Prescriptions New Prescriptions   No medications on file  An After Visit Summary was printed and given to the patient.    Sidney Ace 03/13/17 2217    Elnora Morrison, MD 03/13/17 [phone]

## 2017-03-13 NOTE — ED Notes (Signed)
Bladder scan performed showed > 672ml of urine in bladder,

## 2017-03-13 NOTE — Discharge Instructions (Signed)
Return if any problems.

## 2017-03-25 ENCOUNTER — Telehealth: Payer: Self-pay | Admitting: *Deleted

## 2017-03-25 NOTE — Telephone Encounter (Signed)
Oncology Nurse Navigator Documentation  LVMM for patient requesting call-back re further treatment s/p surgery at [facility].  [name], RN, BSN, [place] Neck Oncology Nurse [place] at [place] [phone]

## 2017-03-29 ENCOUNTER — Telehealth: Payer: Self-pay | Admitting: *Deleted

## 2017-03-29 NOTE — Telephone Encounter (Signed)
Oncology Nurse Navigator Documentation  Called patient to confirm Care Everywhere notes indicating he will be receiving RT/chemo at [facility], [place] requestin call-back.  [name], RN, BSN, [place] [facility]ck Oncology Nurse [place] at [place] [phone]

## 2017-04-08 ENCOUNTER — Emergency Department (HOSPITAL_COMMUNITY)
Admission: EM | Admit: 2017-04-08 | Discharge: 2017-04-08 | Disposition: A | Discharge status: Home / Self Care | Payer: Medicare Other | Attending: Emergency Medicine | Admitting: Emergency Medicine

## 2017-04-08 ENCOUNTER — Encounter (HOSPITAL_COMMUNITY): Payer: Self-pay | Admitting: Emergency Medicine

## 2017-04-08 DIAGNOSIS — R339 Retention of urine, unspecified: Secondary | ICD-10-CM | POA: Insufficient documentation

## 2017-04-08 DIAGNOSIS — N390 Urinary tract infection, site not specified: Secondary | ICD-10-CM | POA: Diagnosis not present

## 2017-04-08 DIAGNOSIS — J449 Chronic obstructive pulmonary disease, unspecified: Secondary | ICD-10-CM | POA: Diagnosis not present

## 2017-04-08 DIAGNOSIS — E119 Type 2 diabetes mellitus without complications: Secondary | ICD-10-CM | POA: Insufficient documentation

## 2017-04-08 DIAGNOSIS — D0002 Carcinoma in situ of buccal mucosa: Secondary | ICD-10-CM | POA: Diagnosis not present

## 2017-04-08 DIAGNOSIS — Z794 Long term (current) use of insulin: Secondary | ICD-10-CM | POA: Insufficient documentation

## 2017-04-08 DIAGNOSIS — I10 Essential (primary) hypertension: Secondary | ICD-10-CM | POA: Insufficient documentation

## 2017-04-08 DIAGNOSIS — Z87891 Personal history of nicotine dependence: Secondary | ICD-10-CM | POA: Diagnosis not present

## 2017-04-08 DIAGNOSIS — Z79899 Other long term (current) drug therapy: Secondary | ICD-10-CM | POA: Insufficient documentation

## 2017-04-08 LAB — URINALYSIS, ROUTINE W REFLEX MICROSCOPIC
Bilirubin Urine: NEGATIVE
GLUCOSE, UA: NEGATIVE mg/dL
HGB URINE DIPSTICK: NEGATIVE
KETONES UR: NEGATIVE mg/dL
Nitrite: NEGATIVE
PROTEIN: NEGATIVE mg/dL
SQUAMOUS EPITHELIAL / LPF: NONE SEEN
Specific Gravity, Urine: 1.01 (ref 1.005–1.030)
pH: 7 (ref 5.0–8.0)

## 2017-04-08 MED ORDER — CEPHALEXIN 500 MG PO CAPS: 500.0000 mg | ORAL_CAPSULE | Freq: Four times a day (QID) | ORAL | 0 refills | Status: DC

## 2017-04-08 MED ORDER — CEPHALEXIN 500 MG PO CAPS
500.0000 mg | ORAL_CAPSULE | Freq: Once | ORAL | Status: AC
  Administered 2017-04-08: 500 mg via ORAL
  Filled 2017-04-08: qty 1

## 2017-04-08 NOTE — ED Triage Notes (Signed)
Pt's catheter was removed yesterday and seen urologist today for urinary retention. Pt's caregiver states they increased his meds but did not insert another foley. She states he needs a foley.

## 2017-04-08 NOTE — Discharge Instructions (Signed)
Take the prescription as directed.  Call your regular Urologist on Monday to schedule a follow up appointment within the next 3 days.  Return to the Emergency Department immediately sooner if worsening.

## 2017-04-08 NOTE — ED Provider Notes (Signed)
[facility] DEPT Provider Note   CSN: [id number] Arrival date & time: 04/08/17  2056     History   Chief Complaint Chief Complaint  Patient presents with  . Urinary Retention    HPI Colin Wagner is a 66 y.o. male.  HPI  Pt was seen at 2120. Per pt's family and pt, c/o gradual onset and worsening of persistent urinary retention since yesterday evening. Pt had a foley catheter removed yesterday morning by Uro MD. Pt's family states by yesterday evening pt was "starting to have a hard time urinating." Pt's family states Uro MD "increased his medicine" but this did not improve his symptoms. Pt is requesting a foley catheter be placed. Denies abd pain, no no N/V/D, no flank/back pain, no dysuria/hematuria, no testicular pain/swelling, no fevers, no rash, no CP/SOB.   Past Medical History:  Diagnosis Date  . Alcohol abuse   . Alcoholic pancreatitis 2706   admission  . Chronic back pain   . Chronic pancreatitis ([place])    based on ct findings 2016  . COPD (chronic obstructive pulmonary disease) ([place])   . Diabetes mellitus   . Diverticulosis   . Gastritis   . GERD (gastroesophageal reflux disease)   . Headache   . Hyperlipidemia   . Hypertension   . Peptic ulcer disease 1999   Per medical reports, no H pylori  . Renal cancer, left ([place]) 2012   he tells me that he has been released, ?and that he is free of cancer, and never had it to begin with.   . Right shoulder pain   . Testicular hypofunction     Patient Active Problem List   Diagnosis Date Noted  . 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 Rectal bleeding 09/28/2016  . Hyponatremia 04/28/2015  . Abdominal pain 04/27/2015  . Acute pancreatitis 04/27/2015  . 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 ETOH abuse 04/27/2015  . DM type 2 (diabetes mellitus, type 2) ([place]) 04/27/2015  . Hypertension, uncontrolled 04/27/2015  . Hyperlipidemia 04/27/2015  . Hypertensive urgency 04/27/2015  . Alcohol abuse      Past Surgical History:  Procedure Laterality Date  . COLONOSCOPY  2003   Dr. [name], polyps  . COLONOSCOPY  2005   Dr. [name], multiple diverticula  . COLONOSCOPY  2008   Dr. [name], diverticulosis  . COLONOSCOPY WITH PROPOFOL N/A 10/21/2016   Procedure: COLONOSCOPY WITH PROPOFOL;  Surgeon: [name], MD;  Location: [facility];  Service: Endoscopy;  Laterality: N/A;  8:30 am  . ESOPHAGOGASTRODUODENOSCOPY     Multiple EGDs. 1999 EGD showed gastric ulcers, no H pylori and benign biopsies performed by Dr. [name]. 2001 gastric ulcer healed. Last EGD 2005 had gastritis.  Marland Kitchen LUNG BIOPSY    . PARTIAL NEPHRECTOMY Left 2012  . POLYPECTOMY  10/21/2016   Procedure: POLYPECTOMY;  Surgeon: [name], MD;  Location: [facility];  Service: Endoscopy;;  hepatic flexure x2       Home Medications    Prior to Admission medications   Medication Sig Start Date End Date Taking? Authorizing Provider  Acetaminophen (TYLENOL 8 HOUR PO) Take 2 tablets by mouth 2 (two) times daily as needed.    [provider]  albuterol (PROVENTIL HFA;VENTOLIN HFA) 108 (90 Base) MCG/ACT inhaler Inhale 1-2 puffs into the lungs every 6 (six) hours as needed for wheezing or shortness of breath. 09/25/16   Nat Christen, MD  B Complex-C (B-COMPLEX WITH VITAMIN C) tablet  Take 1 tablet by mouth daily with lunch.    [provider]  cetirizine (ZYRTEC) 10 MG tablet Take 10 mg by mouth at bedtime.  03/31/15   [provider]  glipiZIDE (GLUCOTROL) 5 MG tablet Take 5 mg by mouth daily as needed (for high blood sugar). PT said he takes it as needed ( if BS gets up to 200 he said)     [provider]  HYDROcodone-acetaminophen (NORCO) 7.5-325 MG tablet Take 1 tablet by mouth every 6 (six) hours as needed (pain). 01/12/17   [name], MD  ibuprofen (ADVIL,MOTRIN) 200 MG tablet Take 400 mg by mouth every 8 (eight) hours as needed (for pain.).    [provider]    LANTUS 100 UNIT/ML injection 25 Units every evening. 03/09/17   [provider]  lisinopril (PRINIVIL,ZESTRIL) 5 MG tablet Take 5 mg by mouth every evening.  04/02/15   [provider]  LORazepam (ATIVAN) 1 MG tablet Take 0.5-1 tablets by mouth 3 (three) times daily as needed for anxiety. For anxiety 03/31/15   [provider]  metFORMIN (GLUCOPHAGE-XR) 500 MG 24 hr tablet Take 500-1,000 mg by mouth 3 (three) times daily. 2 tabs in the morning, then takes one at lunch and at dinner. 04/02/15   [provider]  Multiple Vitamin (MULTIVITAMIN WITH MINERALS) TABS tablet Take 1 tablet by mouth daily. 04/29/15   Isaac Bliss, Rayford Halsted, MD  NOVOLOG 100 UNIT/ML injection 5 Units every 4 (four) hours. 03/09/17   [provider]  oxyCODONE (ROXICODONE) 5 MG immediate release tablet Take 10 mg by mouth every 4 (four) hours as needed for severe pain.    [provider]  pantoprazole (PROTONIX) 20 MG tablet Take 20 mg by mouth daily. Unsure of dose. Will call    [provider]  polyethylene glycol-electrolytes (TRILYTE) 420 g solution Take 4,000 mLs by mouth as directed. 09/28/16   [name], MD  senna (SENOKOT) 8.6 MG TABS tablet Take 1 tablet (8.6 mg total) by mouth at bedtime as needed for mild constipation. May be needed as pain medication can constipate. 01/12/17   [name], MD  sildenafil (REVATIO) 20 MG tablet Take 20 mg by mouth 3 (three) times daily. As directed    [provider]  sodium fluoride (PREVIDENT 5000 PLUS) 1.1 % CREA dental cream Apply to tooth brush. Brush teeth for 2 minutes. Spit out excess-DO NOT swallow. Repeat nightly. Patient not taking: Reported on 03/11/2017 01/06/17   Lenn Cal, DDS  tamsulosin (FLOMAX) 0.4 MG CAPS capsule Take 0.4 mg by mouth daily with lunch.     [provider]    Family History Family History  Problem Relation Age of Onset  . Hypertension Mother   . Colon cancer  Neg Hx     Social History Social History  Substance Use Topics  . Smoking status: Former Smoker    Packs/day: 0.50    Years: 35.00  . Smokeless tobacco: Never Used     Comment: Only smoking a few cigarettes now  . Alcohol use No     Comment: quit 2000 but relapse in 2016. no etoh since hospitalized 2016.      Allergies   Patient has no known allergies.   Review of Systems Review of Systems ROS: Statement: All systems negative except as marked or noted in the HPI; Constitutional: Negative for fever and chills. ; ; Eyes: Negative for eye pain, redness and discharge. ; ; ENMT:  Negative for ear pain, hoarseness, nasal congestion, sinus pressure and sore throat. ; ; Cardiovascular: Negative for chest pain, palpitations, diaphoresis, dyspnea and peripheral edema. ; ; Respiratory: Negative for cough, wheezing and stridor. ; ; Gastrointestinal: Negative for nausea, vomiting, diarrhea, abdominal pain, blood in stool, hematemesis, jaundice and rectal bleeding. . ; ; Genitourinary: +urinary retention. Negative for dysuria, flank pain and hematuria. ; ; Genital:  No penile drainage or rash, no testicular pain or swelling, no scrotal rash or swelling. ;; Musculoskeletal: Negative for back pain and neck pain. Negative for swelling and trauma.; ; Skin: Negative for pruritus, rash, abrasions, blisters, bruising and skin lesion.; ; Neuro: Negative for headache, lightheadedness and neck stiffness. Negative for weakness, altered level of consciousness, altered mental status, extremity weakness, paresthesias, involuntary movement, seizure and syncope.       Physical Exam Updated Vital Signs BP 123/61 (BP Location: Right Arm)   Pulse 92   Temp 98.8 F (37.1 C) (Temporal)   Resp 18   Ht 5\' 11"  (1.803 m)   Wt 70.8 kg (156 lb)   SpO2 99%   BMI 21.76 kg/m   Physical Exam 2125: Physical examination:  Nursing notes reviewed; Vital signs and O2 SAT reviewed;  Constitutional: Well developed, Well  nourished, Well hydrated, In no acute distress; Head:  Normocephalic, atraumatic; Eyes: EOMI, PERRL, No scleral icterus; ENMT: Mouth and pharynx normal, Mucous membranes moist; Neck: Supple, Full range of motion, No lymphadenopathy; Cardiovascular: Regular rate and rhythm, No gallop; Respiratory: Breath sounds clear & equal bilaterally, No wheezes.  Speaking full sentences with ease, Normal respiratory effort/excursion; Chest: Nontender, Movement normal; Abdomen: Soft, Nontender, +mild suprapubic distention. Normal bowel sounds; Genitourinary: No CVA tenderness; Extremities: Pulses normal, No tenderness, No edema, No calf edema or asymmetry.; Neuro: AA&Ox3, Major CN grossly intact.  Speech clear. No gross focal motor or sensory deficits in extremities.; Skin: Color normal, Warm, Dry.   ED Treatments / Results  Labs (all labs ordered are listed, but only abnormal results are displayed)   EKG  EKG Interpretation None       Radiology   Procedures Procedures (including critical care time)  Medications Ordered in ED Medications - No data to display   Initial Impression / Assessment and Plan / ED Course  I have reviewed the triage vital signs and the nursing notes.  Pertinent labs & imaging results that were available during my care of the patient were reviewed by me and considered in my medical decision making (see chart for details).  MDM Reviewed: previous chart, nursing note and vitals Reviewed previous: labs Interpretation: labs   Results for orders placed or performed during the hospital encounter of 04/08/17  Urinalysis, Routine w reflex microscopic  Result Value Ref Range   Color, Urine YELLOW YELLOW   APPearance HAZY (A) CLEAR   Specific Gravity, Urine 1.010 1.005 - 1.030   pH 7.0 5.0 - 8.0   Glucose, UA NEGATIVE NEGATIVE mg/dL   Hgb urine dipstick NEGATIVE NEGATIVE   Bilirubin Urine NEGATIVE NEGATIVE   Ketones, ur NEGATIVE NEGATIVE mg/dL   Protein, ur NEGATIVE  NEGATIVE mg/dL   Nitrite NEGATIVE NEGATIVE   Leukocytes, UA LARGE (A) NEGATIVE   RBC / HPF 0-5 0 - 5 RBC/hpf   WBC, UA TOO NUMEROUS TO COUNT 0 - 5 WBC/hpf   Bacteria, UA RARE (A) NONE SEEN   Squamous Epithelial / LPF NONE SEEN NONE SEEN    2310:  Foley placed with 560ml urine output. Pt states he feels  better and wants to go home now.  +UTI, UC pending; will tx keflex. Dx and testing d/w pt and family.  Questions answered.  Verb understanding, agreeable to d/c home with outpt f/u.   Final Clinical Impressions(s) / ED Diagnoses   Final diagnoses:  None    New Prescriptions New Prescriptions   No medications on file     Francine Graven, DO 04/10/17 1410

## 2017-04-12 LAB — URINE CULTURE: Culture: >=100000 — AB

## 2017-04-13 ENCOUNTER — Telehealth: Payer: Self-pay | Admitting: Emergency Medicine

## 2017-04-13 NOTE — Telephone Encounter (Signed)
Post ED Visit - Positive Culture Follow-up: Successful Patient Follow-Up  Culture assessed and recommendations reviewed by: []  Elenor Quinones, Pharm.D. []  Heide Guile, Pharm.D., BCPS AQ-ID []  Parks Neptune, Pharm.D., BCPS []  Alycia Rossetti, Pharm.D., BCPS []  [place], [place].D., BCPS, AAHIVP []  Legrand Como, Pharm.D., BCPS, AAHIVP []  Salome Arnt, PharmD, BCPS []  Dimitri Ped, PharmD, BCPS []  Vincenza Hews, PharmD, BCPS Nida Boatman PharmD  Positive urine culture  []  Patient discharged without antimicrobial prescription and treatment is now indicated [x]  Organism is resistant to prescribed ED discharge antimicrobial []  Patient with positive blood cultures  Changes discussed with ED provider: Lenn Sink PA New antibiotic prescription if + Symptoms, needs to return to ED for reassess and treatment  Spoke with [facility] and advised if + symptoms, needs return to ED   Hazle Nordmann 04/13/2017, 4:13 PM

## 2017-04-13 NOTE — Progress Notes (Signed)
ED Antimicrobial Stewardship Positive Culture Follow Up   Colin Wagner is an 66 y.o. male who presented to [facility] on 04/08/2017 with a chief complaint of  Chief Complaint  Patient presents with  . Urinary Retention    Recent Results (from the past 720 hour(s))  Urine culture     Status: Abnormal   Collection Time: 04/08/17  9:38 PM  Result Value Ref Range Status   Specimen Description URINE, CLEAN CATCH  Final   Special Requests NONE  Final   Culture (A)  Final    >=100,000 COLONIES/mL KLEBSIELLA PNEUMONIAE Confirmed Extended Spectrum Beta-Lactamase Producer (ESBL) Performed at [facility], [address] [place], [place] [address]    Report Status 04/12/2017 FINAL  Final   Organism ID, Bacteria KLEBSIELLA PNEUMONIAE (A)  Final      Susceptibility   Klebsiella pneumoniae - MIC*    AMPICILLIN >=32 RESISTANT Resistant     CEFAZOLIN >=64 RESISTANT Resistant     CEFTRIAXONE >=64 RESISTANT Resistant     CIPROFLOXACIN 2 INTERMEDIATE Intermediate     GENTAMICIN <=1 SENSITIVE Sensitive     IMIPENEM <=0.25 SENSITIVE Sensitive     NITROFURANTOIN 64 INTERMEDIATE Intermediate     TRIMETH/SULFA >=320 RESISTANT Resistant     AMPICILLIN/SULBACTAM >=32 RESISTANT Resistant     PIP/TAZO 32 INTERMEDIATE Intermediate     Extended ESBL POSITIVE Resistant     * >=100,000 COLONIES/mL KLEBSIELLA PNEUMONIAE    [x]  Treated with cephalexin, organism resistant to prescribed antimicrobial []  Patient discharged originally without antimicrobial agent and treatment is now indicated  Patient has indwelling foley catheter that was removed.   Plan: Call patient and ask if he is symptomatic. If urinary symptoms present, would recommend returning to the ED for treatment given the resistance patterns.   ED Provider: Lenn Sink, PA-C   Nida Boatman, PharmD PGY1 Acute Care Pharmacy Resident Pager: [phone]  04/13/2017, 10:19 AM Infectious Diseases Pharmacist Phone# [phone]

## 2017-04-18 ENCOUNTER — Telehealth: Payer: Self-pay | Admitting: *Deleted

## 2017-04-18 NOTE — Telephone Encounter (Signed)
Oncology Nurse Navigator Documentation  Called Mr. Pecha to confirm his location preference for RT, spoke with SO [name].    She stated he decided last Friday he wants RT at [facility] rather than [facility] because of closer proximity.   I noted he has not had any dental extractions per [facility] notes, she stated he was told he "doesn't need them because they are not close to where he will be treated".  I stated he will likely be scheduled to see Dr. Enrique Sack, [facility], for further evaluation, she voiced understanding.  SO confirmed understanding of 9/5 1:30 NE, 2:00 appt Dr. Isidore Moos. Drs. Isidore Moos and [place] provided update.  [name], RN, BSN, [place] Oncology Nurse [place] [facility] [place] [phone]

## 2017-04-19 ENCOUNTER — Inpatient Hospital Stay (HOSPITAL_COMMUNITY)
Admission: EM | Admit: 2017-04-19 | Discharge: 2017-04-22 | DRG: 690 | Disposition: A | Discharge status: Home Health | Payer: Medicare Other | Attending: Internal Medicine | Admitting: Internal Medicine

## 2017-04-19 ENCOUNTER — Encounter (HOSPITAL_COMMUNITY): Payer: Self-pay | Admitting: *Deleted

## 2017-04-19 ENCOUNTER — Emergency Department (HOSPITAL_COMMUNITY): Payer: Medicare Other

## 2017-04-19 DIAGNOSIS — C411 Malignant neoplasm of mandible: Secondary | ICD-10-CM

## 2017-04-19 DIAGNOSIS — Z794 Long term (current) use of insulin: Secondary | ICD-10-CM

## 2017-04-19 DIAGNOSIS — Z23 Encounter for immunization: Secondary | ICD-10-CM | POA: Diagnosis not present

## 2017-04-19 DIAGNOSIS — N390 Urinary tract infection, site not specified: Secondary | ICD-10-CM | POA: Diagnosis present

## 2017-04-19 DIAGNOSIS — K219 Gastro-esophageal reflux disease without esophagitis: Secondary | ICD-10-CM | POA: Diagnosis present

## 2017-04-19 DIAGNOSIS — E119 Type 2 diabetes mellitus without complications: Secondary | ICD-10-CM | POA: Diagnosis present

## 2017-04-19 DIAGNOSIS — Z1612 Extended spectrum beta lactamase (ESBL) resistance: Secondary | ICD-10-CM | POA: Diagnosis present

## 2017-04-19 DIAGNOSIS — K5730 Diverticulosis of large intestine without perforation or abscess without bleeding: Secondary | ICD-10-CM | POA: Diagnosis present

## 2017-04-19 DIAGNOSIS — Z1624 Resistance to multiple antibiotics: Secondary | ICD-10-CM | POA: Diagnosis present

## 2017-04-19 DIAGNOSIS — I10 Essential (primary) hypertension: Secondary | ICD-10-CM | POA: Diagnosis present

## 2017-04-19 DIAGNOSIS — E440 Moderate protein-calorie malnutrition: Secondary | ICD-10-CM | POA: Diagnosis present

## 2017-04-19 DIAGNOSIS — Z931 Gastrostomy status: Secondary | ICD-10-CM | POA: Diagnosis not present

## 2017-04-19 DIAGNOSIS — B370 Candidal stomatitis: Secondary | ICD-10-CM | POA: Diagnosis present

## 2017-04-19 DIAGNOSIS — Z85048 Personal history of other malignant neoplasm of rectum, rectosigmoid junction, and anus: Secondary | ICD-10-CM

## 2017-04-19 DIAGNOSIS — C140 Malignant neoplasm of pharynx, unspecified: Secondary | ICD-10-CM | POA: Diagnosis present

## 2017-04-19 DIAGNOSIS — K861 Other chronic pancreatitis: Secondary | ICD-10-CM

## 2017-04-19 DIAGNOSIS — C060 Malignant neoplasm of cheek mucosa: Secondary | ICD-10-CM | POA: Diagnosis present

## 2017-04-19 DIAGNOSIS — Z8711 Personal history of peptic ulcer disease: Secondary | ICD-10-CM

## 2017-04-19 DIAGNOSIS — E785 Hyperlipidemia, unspecified: Secondary | ICD-10-CM | POA: Diagnosis present

## 2017-04-19 DIAGNOSIS — B9689 Other specified bacterial agents as the cause of diseases classified elsewhere: Secondary | ICD-10-CM

## 2017-04-19 DIAGNOSIS — B961 Klebsiella pneumoniae [K. pneumoniae] as the cause of diseases classified elsewhere: Secondary | ICD-10-CM | POA: Diagnosis present

## 2017-04-19 DIAGNOSIS — J449 Chronic obstructive pulmonary disease, unspecified: Secondary | ICD-10-CM | POA: Diagnosis present

## 2017-04-19 DIAGNOSIS — Z6822 Body mass index (BMI) 22.0-22.9, adult: Secondary | ICD-10-CM

## 2017-04-19 DIAGNOSIS — Z923 Personal history of irradiation: Secondary | ICD-10-CM

## 2017-04-19 DIAGNOSIS — B3789 Other sites of candidiasis: Secondary | ICD-10-CM | POA: Diagnosis present

## 2017-04-19 DIAGNOSIS — Z87891 Personal history of nicotine dependence: Secondary | ICD-10-CM

## 2017-04-19 DIAGNOSIS — E871 Hypo-osmolality and hyponatremia: Secondary | ICD-10-CM | POA: Diagnosis present

## 2017-04-19 LAB — PROTIME-INR
INR: 1.08
PROTHROMBIN TIME: 13.9 s (ref 11.4–15.2)

## 2017-04-19 LAB — CBC WITH DIFFERENTIAL/PLATELET
BASOS ABS: 0 10*3/uL (ref 0.0–0.1)
BASOS PCT: 0 %
EOS ABS: 0.1 10*3/uL (ref 0.0–0.7)
EOS PCT: 1 %
HCT: 28.1 % — ABNORMAL LOW (ref 39.0–52.0)
Hemoglobin: 9.1 g/dL — ABNORMAL LOW (ref 13.0–17.0)
Lymphocytes Relative: 13 %
Lymphs Abs: 1.3 10*3/uL (ref 0.7–4.0)
MCH: 25.6 pg — AB (ref 26.0–34.0)
MCHC: 32.4 g/dL (ref 30.0–36.0)
MCV: 79.2 fL (ref 78.0–100.0)
Monocytes Absolute: 0.5 10*3/uL (ref 0.1–1.0)
Monocytes Relative: 5 %
Neutro Abs: 8.3 10*3/uL — ABNORMAL HIGH (ref 1.7–7.7)
Neutrophils Relative %: 81 %
PLATELETS: 330 10*3/uL (ref 150–400)
RBC: 3.55 MIL/uL — AB (ref 4.22–5.81)
RDW: 15.4 % (ref 11.5–15.5)
WBC: 10.2 10*3/uL (ref 4.0–10.5)

## 2017-04-19 LAB — COMPREHENSIVE METABOLIC PANEL
ALT: 23 U/L (ref 17–63)
AST: 17 U/L (ref 15–41)
Albumin: 3.4 g/dL — ABNORMAL LOW (ref 3.5–5.0)
Alkaline Phosphatase: 95 U/L (ref 38–126)
Anion gap: 9 (ref 5–15)
BILIRUBIN TOTAL: 0.2 mg/dL — AB (ref 0.3–1.2)
BUN: 39 mg/dL — AB (ref 6–20)
CO2: 28 mmol/L (ref 22–32)
CREATININE: 1.17 mg/dL (ref 0.61–1.24)
Calcium: 9.6 mg/dL (ref 8.9–10.3)
Chloride: 95 mmol/L — ABNORMAL LOW (ref 101–111)
GFR calc Af Amer: >60 mL/min (ref >60)
Glucose, Bld: 173 mg/dL — ABNORMAL HIGH (ref 65–99)
POTASSIUM: 4.8 mmol/L (ref 3.5–5.1)
Sodium: 132 mmol/L — ABNORMAL LOW (ref 135–145)
TOTAL PROTEIN: 7.7 g/dL (ref 6.5–8.1)

## 2017-04-19 LAB — URINALYSIS, ROUTINE W REFLEX MICROSCOPIC
Bilirubin Urine: NEGATIVE
Glucose, UA: NEGATIVE mg/dL
HGB URINE DIPSTICK: NEGATIVE
Ketones, ur: NEGATIVE mg/dL
Nitrite: NEGATIVE
Protein, ur: NEGATIVE mg/dL
SPECIFIC GRAVITY, URINE: 1.012 (ref 1.005–1.030)
Squamous Epithelial / LPF: NONE SEEN
pH: 8 (ref 5.0–8.0)

## 2017-04-19 LAB — LACTIC ACID, PLASMA: LACTIC ACID, VENOUS: 1 mmol/L (ref 0.5–1.9)

## 2017-04-19 LAB — LIPASE, BLOOD: LIPASE: 24 U/L (ref 11–51)

## 2017-04-19 MED ORDER — ONDANSETRON HCL 4 MG/2ML IJ SOLN
4.0000 mg | Freq: Once | INTRAMUSCULAR | Status: AC
  Administered 2017-04-19: 4 mg via INTRAVENOUS
  Filled 2017-04-19: qty 2

## 2017-04-19 MED ORDER — SODIUM CHLORIDE 0.9 % IV SOLN
500.0000 mg | Freq: Once | INTRAVENOUS | Status: AC
  Administered 2017-04-20: 500 mg via INTRAVENOUS
  Filled 2017-04-19: qty 500

## 2017-04-19 MED ORDER — DEXTROSE 5 % IV SOLN: 1.0000 g | Freq: Once | INTRAVENOUS | Status: DC

## 2017-04-19 MED ORDER — IOPAMIDOL (ISOVUE-300) INJECTION 61%
150.0000 mL | Freq: Once | INTRAVENOUS | Status: AC | PRN
  Administered 2017-04-19: 150 mL via INTRAVENOUS

## 2017-04-19 MED ORDER — MORPHINE SULFATE (PF) 4 MG/ML IV SOLN
4.0000 mg | Freq: Once | INTRAVENOUS | Status: AC
  Administered 2017-04-19: 4 mg via INTRAVENOUS
  Filled 2017-04-19: qty 1

## 2017-04-19 MED ORDER — SODIUM CHLORIDE 0.9 % IV BOLUS (SEPSIS)
500.0000 mL | Freq: Once | INTRAVENOUS | Status: AC
  Administered 2017-04-19: 500 mL via INTRAVENOUS

## 2017-04-19 NOTE — ED Triage Notes (Signed)
Pt c/o abd pain that started today due to constipation, was given enema and laxatives at home with improvement in constipation, then started having a fever with n/v, continues to have abd pain,

## 2017-04-19 NOTE — ED Provider Notes (Signed)
[facility] DEPT Provider Note   CSN: [id number] Arrival date & time: 04/19/17  2003     History   Chief Complaint Chief Complaint  Patient presents with  . Fever    HPI Colin Wagner is a 66 y.o. male.  HPI Patient presents with central abdominal pain, nausea and one episode of vomiting.He also has been running low-grade fever. Had bowel movement earlier today. No blood in the stool. Patient has a full catheter in place from acute urinary retention is yet to see a urologist.  Past Medical History:  Diagnosis Date  . Alcohol abuse   . Alcoholic pancreatitis 1941   admission  . Chronic back pain   . Chronic pancreatitis ([place])    based on ct findings 2016  . COPD (chronic obstructive pulmonary disease) ([place])   . Diabetes mellitus   . Diverticulosis   . Gastritis   . GERD (gastroesophageal reflux disease)   . Headache   . Hyperlipidemia   . Hypertension   . Peptic ulcer disease 1999   Per medical reports, no H pylori  . Renal cancer, left ([place]) 2012   he tells me that he has been released, ?and that he is free of cancer, and never had it to begin with.   . Right shoulder pain   . Testicular hypofunction     Patient Active Problem List   Diagnosis Date Noted  . UTI (urinary tract infection) 04/20/2017  . UTI due to Klebsiella species 04/20/2017  . Essential hypertension 04/20/2017  . Chronic pancreatitis ([place]) 04/20/2017  . 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 Malnutrition of moderate degree 04/20/2017  . Complicated UTI (urinary tract infection)   . 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 Rectal bleeding 09/28/2016  . Hyponatremia 04/28/2015  . Abdominal pain 04/27/2015  . Acute pancreatitis 04/27/2015  . 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 ETOH abuse 04/27/2015  . DM type 2 (diabetes mellitus, type 2) ([place]) 04/27/2015  . Hypertension, uncontrolled 04/27/2015  . Hyperlipidemia 04/27/2015  . Hypertensive urgency  04/27/2015  . Alcohol abuse     Past Surgical History:  Procedure Laterality Date  . COLONOSCOPY  2003   Dr. [name], polyps  . COLONOSCOPY  2005   Dr. [name], multiple diverticula  . COLONOSCOPY  2008   Dr. [name], diverticulosis  . COLONOSCOPY WITH PROPOFOL N/A 10/21/2016   Procedure: COLONOSCOPY WITH PROPOFOL;  Surgeon: [name], MD;  Location: [facility];  Service: Endoscopy;  Laterality: N/A;  8:30 am  . ESOPHAGOGASTRODUODENOSCOPY     Multiple EGDs. 1999 EGD showed gastric ulcers, no H pylori and benign biopsies performed by Dr. [name]. 2001 gastric ulcer healed. Last EGD 2005 had gastritis.  Marland Kitchen LUNG BIOPSY    . PARTIAL NEPHRECTOMY Left 2012  . POLYPECTOMY  10/21/2016   Procedure: POLYPECTOMY;  Surgeon: [name], MD;  Location: [facility];  Service: Endoscopy;;  hepatic flexure x2       Home Medications    Prior to Admission medications   Medication Sig Start Date End Date Taking? Authorizing Provider  Acetaminophen (TYLENOL 8 HOUR PO) 2 tablets by Feeding Tube route 2 (two) times daily as needed.    Yes [provider]  albuterol (PROVENTIL HFA;VENTOLIN HFA) 108 (90 Base) MCG/ACT inhaler Inhale 1-2 puffs into the lungs every 6 (six) hours as needed for wheezing or shortness of breath. 09/25/16  Yes Nat Christen, MD  cetirizine (ZYRTEC) 10 MG  tablet Place 10 mg into feeding tube at bedtime.  03/31/15  Yes [provider]  fluconazole (DIFLUCAN) 100 MG tablet Place 1 tablet into feeding tube daily. 7 day course starting on 04/15/2017   Yes [provider]  glipiZIDE (GLUCOTROL) 5 MG tablet Place 5 mg into feeding tube daily as needed (for high blood sugar). PT said he takes it as needed ( if BS gets up to 200 he said)    Yes [provider]  HYDROcodone-acetaminophen (NORCO) 7.5-325 MG tablet Take 1 tablet by mouth every 6 (six) hours as needed (pain). Patient taking differently: 1 tablet every 6 (six) hours as needed  (pain). Per feeding tube 01/12/17  Yes [name], MD  LANTUS 100 UNIT/ML injection Inject 25 Units into the skin every evening.  03/09/17  Yes [provider]  lisinopril (PRINIVIL,ZESTRIL) 5 MG tablet Place 5 mg into feeding tube every morning.  04/02/15  Yes [provider]  LORazepam (ATIVAN) 1 MG tablet Take 1 mg by mouth 4 (four) times daily as needed for anxiety. For anxiety 03/31/15  Yes [provider]  NOVOLOG 100 UNIT/ML injection Inject 5 Units into the skin every 4 (four) hours.  03/09/17  Yes [provider]  pantoprazole (PROTONIX) 20 MG tablet 20 mg daily. Per feeding tube   Yes [provider]  senna (SENOKOT) 8.6 MG TABS tablet Take 1 tablet (8.6 mg total) by mouth at bedtime as needed for mild constipation. May be needed as pain medication can constipate. Patient taking differently: Place 1 tablet into feeding tube at bedtime as needed for mild constipation. May be needed as pain medication can constipate. 01/12/17  Yes [name], MD  terazosin (HYTRIN) 1 MG capsule Place 1 capsule into feeding tube 2 (two) times daily.  03/30/17  Yes [provider]  traZODone (DESYREL) 50 MG tablet Take 50 mg by mouth at bedtime.   Yes [provider]  cephALEXin (KEFLEX) 500 MG capsule Take 1 capsule (500 mg total) by mouth 4 (four) times daily. Patient not taking: Reported on 04/19/2017 04/08/17   Francine Graven, DO    Family History Family History  Problem Relation Age of Onset  . Hypertension Mother   . Colon cancer Neg Hx     Social History Social History  Substance Use Topics  . Smoking status: Former Smoker    Packs/day: 0.50    Years: 35.00  . Smokeless tobacco: Never Used     Comment: Only smoking a few cigarettes now  . Alcohol use No     Comment: quit 2000 but relapse in 2016. no etoh since hospitalized 2016.      Allergies   Patient has no known allergies.   Review of Systems Review of  Systems   Physical Exam Updated Vital Signs BP (!) 103/52 (BP Location: Right Arm)   Pulse 81   Temp 98.4 F (36.9 C) (Oral)   Resp 20   Ht 5\' 11"  (1.803 m)   Wt 74.4 kg (164 lb)   SpO2 97%   BMI 22.87 kg/m   Physical Exam  Constitutional: He is oriented to person, place, and time. He appears well-developed and well-nourished.  HENT:  Head: Normocephalic and atraumatic.  Mouth/Throat: Oropharynx is clear and moist.  Left sided facial deformity and submandibular mass. Mild tenderness to palpation. No erythema. Oropharynx with white patches.  Eyes: Pupils are equal, round, and reactive to light. EOM are normal.  Neck: Normal range of motion. Neck supple.  No meningismus  Cardiovascular: Normal rate and regular rhythm.  Exam reveals no friction rub.   No murmur heard. Pulmonary/Chest: Effort normal. No stridor. He has rales.  Few scattered rhonchi  Abdominal: Soft. Bowel sounds are normal. There is tenderness. There is no rebound and no guarding.  Patient has mild suprapubic and epigastric tenderness with palpation. There is no rebound or guarding.  Musculoskeletal: Normal range of motion. He exhibits no edema or tenderness.  No lower extremity swelling, asymmetry or tenderness. Distal pulses intact.  Neurological: He is alert and oriented to person, place, and time.  Moving all extremities without focal deficit. Sensation fully intact. Difficult to understand speech due to previous facial surgery.  Skin: Skin is warm and dry. No rash noted. No erythema.  Psychiatric: He has a normal mood and affect. His behavior is normal.  Nursing note and vitals reviewed.    ED Treatments / Results  Labs (all labs ordered are listed, but only abnormal results are displayed) Labs Reviewed  COMPREHENSIVE METABOLIC PANEL - Abnormal; Notable for the following:       Result Value   Sodium 132 (*)    Chloride 95 (*)    Glucose, Bld 173 (*)    BUN 39 (*)    Albumin 3.4 (*)    Total  Bilirubin 0.2 (*)    All other components within normal limits  CBC WITH DIFFERENTIAL/PLATELET - Abnormal; Notable for the following:    RBC 3.55 (*)    Hemoglobin 9.1 (*)    HCT 28.1 (*)    MCH 25.6 (*)    Neutro Abs 8.3 (*)    All other components within normal limits  URINALYSIS, ROUTINE W REFLEX MICROSCOPIC - Abnormal; Notable for the following:    APPearance HAZY (*)    Leukocytes, UA LARGE (*)    Bacteria, UA RARE (*)    All other components within normal limits  CBC - Abnormal; Notable for the following:    RBC 3.52 (*)    Hemoglobin 9.1 (*)    HCT 27.8 (*)    MCH 25.9 (*)    All other components within normal limits  COMPREHENSIVE METABOLIC PANEL - Abnormal; Notable for the following:    Sodium 132 (*)    Chloride 97 (*)    Glucose, Bld 138 (*)    BUN 30 (*)    Albumin 3.2 (*)    All other components within normal limits  HEMOGLOBIN A1C - Abnormal; Notable for the following:    Hgb A1c MFr Bld 6.3 (*)    All other components within normal limits  GLUCOSE, CAPILLARY - Abnormal; Notable for the following:    Glucose-Capillary 174 (*)    All other components within normal limits  CBG MONITORING, ED - Abnormal; Notable for the following:    Glucose-Capillary 150 (*)    All other components within normal limits  CULTURE, BLOOD (ROUTINE X 2)  CULTURE, BLOOD (ROUTINE X 2)  URINE CULTURE  PROTIME-INR  LACTIC ACID, PLASMA  LACTIC ACID, PLASMA  LIPASE, BLOOD    EKG  EKG Interpretation None       Radiology Ct Soft Tissue Neck W Contrast  Result Date: 04/20/2017 CLINICAL DATA:  Fever. Jaw swelling. History of head and neck cancer. EXAM: CT NECK WITH CONTRAST TECHNIQUE: Multidetector CT imaging of the neck was performed using the standard protocol following the bolus administration of intravenous contrast. CONTRAST:  184mL ISOVUE-300 IOPAMIDOL (ISOVUE-300) INJECTION 61% COMPARISON:  CT neck March 19, 2017  FINDINGS: PHARYNX AND LARYNX: Postsurgical changes for section  of LEFT retromolar trigone mass with flap. Narrowed though patent LEFT internal jugular vein. Granulation tissue LEFT neck contiguous with the LEFT sternocleidomastoid muscle. Status post LEFT neck dissection. No focal fluid collection, or acute inflammatory changes. Patent airway. SALIVARY GLANDS: Resected LEFT submandibular gland. Mildly hypertrophic LEFT sublingual gland. Surgical clips LEFT parotid gland with possible partial LEFT parotidectomy. THYROID: Normal. LYMPH NODES: No lymphadenopathy by CT size criteria. VASCULAR: Mild calcific atherosclerosis aortic arch and carotid bifurcations. Approximate 50% stenosis RIGHT internal carotid artery on this non angiographic phase. LIMITED INTRACRANIAL: Normal. VISUALIZED ORBITS: Normal. MASTOIDS AND VISUALIZED PARANASAL SINUSES: Resected LEFT posterior maxillary antrum, well-aerated surgical bed extending into LEFT infratemporal fossa. Surgical clip LEFT pterygopalatine fossa. Paranasal sinus are well aerated. Small LEFT mastoid effusion without air cell coalescence. SKELETON: LEFT mandible plate and screw fixation with reconstruction, intact well-seated hardware. Slight offset of the posterior fusion at the angle of the mandible. Nondisplaced RIGHT nasal bone fracture. UPPER CHEST: Lung apices are clear. No superior mediastinal lymphadenopathy. OTHER: None. IMPRESSION: 1. No acute process in the neck. 2. Status post LEFT partial maxillectomy and reconstructive changes of LEFT mandible for head and neck tumor. LEFT upper neck flap and LEFT modified radical neck dissection. No CT findings of residual or recurrent tumor though, MRI would be more sensitive in the setting of postoperative change. 3. Atherosclerosis resulting in suspected 50% stenosis RIGHT internal carotid artery on this non angiographic phase. Electronically Signed   By: Elon Alas M.D.   On: 04/20/2017 00:25   Ct Abdomen Pelvis W Contrast  Result Date: 04/20/2017 CLINICAL DATA:  Abdominal  pain, onset today.  Fevers. EXAM: CT ABDOMEN AND PELVIS WITH CONTRAST TECHNIQUE: Multidetector CT imaging of the abdomen and pelvis was performed using the standard protocol following bolus administration of intravenous contrast. CONTRAST:  170mL ISOVUE-300 IOPAMIDOL (ISOVUE-300) INJECTION 61% COMPARISON:  01/12/2017, 05/28/2015. FINDINGS: Lower chest: No acute abnormality. Hepatobiliary: No focal liver abnormality is seen. No gallstones, gallbladder wall thickening, or biliary dilatation. Pancreas: Pancreatic calcifications and mild atrophy consistent with chronic pancreatitis. No evidence of acute pancreatitis. No pancreatic parenchymal mass or cyst. Spleen: Normal in size without focal abnormality. Adrenals/Urinary Tract: Adrenal glands are unremarkable. Kidneys are normal, without renal calculi, significant focal lesion, or hydronephrosis. Bladder is remarkable only for Foley catheter. Stomach/Bowel: Percutaneous gastrostomy appears satisfactorily positioned. Stomach and small bowel are otherwise unremarkable. Normal appendix. Colonic diverticulosis without evidence of acute diverticulitis. Vascular/Lymphatic: The abdominal aorta is normal in caliber with moderate atherosclerotic calcification. No adenopathy is evident in the abdomen or pelvis. Reproductive: Unremarkable Other: No focal inflammation.  No ascites. Musculoskeletal: No significant skeletal lesion. IMPRESSION: 1. No acute findings are evident in the abdomen or pelvis. 2. Chronic pancreatitis. 3. Colonic diverticulosis. Electronically Signed   By: Andreas Newport M.D.   On: 04/20/2017 00:56    Procedures Procedures (including critical care time)  Medications Ordered in ED Medications  fluconazole (DIFLUCAN) tablet 100 mg (100 mg Per Tube Given 04/20/17 1021)  traZODone (DESYREL) tablet 50 mg (not administered)  terazosin (HYTRIN) capsule 1 mg (1 mg Per Tube Given 04/20/17 1021)  pantoprazole sodium (PROTONIX) 40 mg/20 mL oral suspension 40  mg (40 mg Oral Given 04/20/17 1021)  HYDROcodone-acetaminophen (NORCO) 7.5-325 MG per tablet 1 tablet (1 tablet Oral Given 04/20/17 1021)  senna (SENOKOT) tablet 8.6 mg (not administered)  loratadine (CLARITIN) tablet 10 mg (10 mg Oral Given 04/20/17 1021)  LORazepam (ATIVAN) tablet 1 mg (not  administered)  free water 100 mL (100 mLs Per Tube Given 04/20/17 1400)  enoxaparin (LOVENOX) injection 40 mg (40 mg Subcutaneous Given 04/20/17 0544)  ondansetron (ZOFRAN) tablet 4 mg (not administered)    Or  ondansetron (ZOFRAN) injection 4 mg (not administered)  imipenem-cilastatin (PRIMAXIN) 500 mg in sodium chloride 0.9 % 100 mL IVPB (0 mg Intravenous Stopped 04/20/17 1232)  insulin aspart (novoLOG) injection 0-9 Units (2 Units Subcutaneous Given 04/20/17 1201)  albuterol (PROVENTIL) (2.5 MG/3ML) 0.083% nebulizer solution 2.5 mg (not administered)  pneumococcal 23 valent vaccine (PNU-IMMUNE) injection 0.5 mL (not administered)  insulin glargine (LANTUS) injection 12 Units (not administered)  chlorhexidine (PERIDEX) 0.12 % solution 15 mL (15 mLs Mouth Rinse Given 04/20/17 1021)  MEDLINE mouth rinse (15 mLs Mouth Rinse Given 04/20/17 1200)  0.9 %  sodium chloride infusion ( Intravenous Rate/Dose Change 04/20/17 0945)  feeding supplement (OSMOLITE 1.5 CAL) liquid 360 mL (not administered)  sodium chloride 0.9 % bolus 500 mL (0 mLs Intravenous Stopped 04/20/17 0057)  morphine 4 MG/ML injection 4 mg (4 mg Intravenous Given 04/19/17 2213)  ondansetron (ZOFRAN) injection 4 mg (4 mg Intravenous Given 04/19/17 2213)  iopamidol (ISOVUE-300) 61 % injection 150 mL (150 mLs Intravenous Contrast Given 04/19/17 2343)  imipenem-cilastatin (PRIMAXIN) 500 mg in sodium chloride 0.9 % 100 mL IVPB (0 mg Intravenous Stopped 04/20/17 0056)  imipenem-cilastatin (PRIMAXIN) 500 mg in sodium chloride 0.9 % 100 mL IVPB (0 mg Intravenous Stopped 04/20/17 0709)     Initial Impression / Assessment and Plan / ED Course  I have reviewed the  triage vital signs and the nursing notes.  Pertinent labs & imaging results that were available during my care of the patient were reviewed by me and considered in my medical decision making (see chart for details).     Signed out to oncoming emergency provider pending CT. Evidence of UTI with urine culture that grew Klebsiella sensitive only to imipenem. Will likely need admission for IV antibiotics.  Final Clinical Impressions(s) / ED Diagnoses   Final diagnoses:  Complicated UTI (urinary tract infection)    New Prescriptions Current Discharge Medication List       Julianne Rice, MD 04/20/17 1558

## 2017-04-20 ENCOUNTER — Encounter (HOSPITAL_COMMUNITY): Payer: Self-pay | Admitting: *Deleted

## 2017-04-20 DIAGNOSIS — Z6822 Body mass index (BMI) 22.0-22.9, adult: Secondary | ICD-10-CM | POA: Diagnosis not present

## 2017-04-20 DIAGNOSIS — C411 Malignant neoplasm of mandible: Secondary | ICD-10-CM

## 2017-04-20 DIAGNOSIS — N390 Urinary tract infection, site not specified: Secondary | ICD-10-CM | POA: Diagnosis present

## 2017-04-20 DIAGNOSIS — B961 Klebsiella pneumoniae [K. pneumoniae] as the cause of diseases classified elsewhere: Secondary | ICD-10-CM | POA: Diagnosis present

## 2017-04-20 DIAGNOSIS — Z931 Gastrostomy status: Secondary | ICD-10-CM | POA: Diagnosis not present

## 2017-04-20 DIAGNOSIS — I10 Essential (primary) hypertension: Secondary | ICD-10-CM | POA: Diagnosis present

## 2017-04-20 DIAGNOSIS — Z1624 Resistance to multiple antibiotics: Secondary | ICD-10-CM | POA: Diagnosis present

## 2017-04-20 DIAGNOSIS — E440 Moderate protein-calorie malnutrition: Secondary | ICD-10-CM | POA: Diagnosis present

## 2017-04-20 DIAGNOSIS — B370 Candidal stomatitis: Secondary | ICD-10-CM | POA: Diagnosis present

## 2017-04-20 DIAGNOSIS — C060 Malignant neoplasm of cheek mucosa: Secondary | ICD-10-CM | POA: Diagnosis present

## 2017-04-20 DIAGNOSIS — B9689 Other specified bacterial agents as the cause of diseases classified elsewhere: Secondary | ICD-10-CM

## 2017-04-20 DIAGNOSIS — Z923 Personal history of irradiation: Secondary | ICD-10-CM | POA: Diagnosis not present

## 2017-04-20 DIAGNOSIS — Z23 Encounter for immunization: Secondary | ICD-10-CM | POA: Diagnosis not present

## 2017-04-20 DIAGNOSIS — K861 Other chronic pancreatitis: Secondary | ICD-10-CM

## 2017-04-20 DIAGNOSIS — E785 Hyperlipidemia, unspecified: Secondary | ICD-10-CM | POA: Diagnosis present

## 2017-04-20 DIAGNOSIS — Z85048 Personal history of other malignant neoplasm of rectum, rectosigmoid junction, and anus: Secondary | ICD-10-CM | POA: Diagnosis not present

## 2017-04-20 DIAGNOSIS — K5730 Diverticulosis of large intestine without perforation or abscess without bleeding: Secondary | ICD-10-CM | POA: Diagnosis present

## 2017-04-20 DIAGNOSIS — E871 Hypo-osmolality and hyponatremia: Secondary | ICD-10-CM

## 2017-04-20 DIAGNOSIS — Z1612 Extended spectrum beta lactamase (ESBL) resistance: Secondary | ICD-10-CM | POA: Diagnosis present

## 2017-04-20 DIAGNOSIS — Z8711 Personal history of peptic ulcer disease: Secondary | ICD-10-CM | POA: Diagnosis not present

## 2017-04-20 DIAGNOSIS — J449 Chronic obstructive pulmonary disease, unspecified: Secondary | ICD-10-CM | POA: Diagnosis present

## 2017-04-20 DIAGNOSIS — B3789 Other sites of candidiasis: Secondary | ICD-10-CM | POA: Diagnosis present

## 2017-04-20 DIAGNOSIS — Z794 Long term (current) use of insulin: Secondary | ICD-10-CM | POA: Diagnosis not present

## 2017-04-20 DIAGNOSIS — C140 Malignant neoplasm of pharynx, unspecified: Secondary | ICD-10-CM | POA: Diagnosis present

## 2017-04-20 DIAGNOSIS — E119 Type 2 diabetes mellitus without complications: Secondary | ICD-10-CM | POA: Diagnosis present

## 2017-04-20 DIAGNOSIS — K219 Gastro-esophageal reflux disease without esophagitis: Secondary | ICD-10-CM | POA: Diagnosis present

## 2017-04-20 DIAGNOSIS — Z87891 Personal history of nicotine dependence: Secondary | ICD-10-CM | POA: Diagnosis not present

## 2017-04-20 LAB — CBG MONITORING, ED: Glucose-Capillary: 150 mg/dL — ABNORMAL HIGH (ref 65–99)

## 2017-04-20 LAB — CBC
HCT: 27.8 % — ABNORMAL LOW (ref 39.0–52.0)
Hemoglobin: 9.1 g/dL — ABNORMAL LOW (ref 13.0–17.0)
MCH: 25.9 pg — AB (ref 26.0–34.0)
MCHC: 32.7 g/dL (ref 30.0–36.0)
MCV: 79 fL (ref 78.0–100.0)
PLATELETS: 307 10*3/uL (ref 150–400)
RBC: 3.52 MIL/uL — ABNORMAL LOW (ref 4.22–5.81)
RDW: 15.5 % (ref 11.5–15.5)
WBC: 8.5 10*3/uL (ref 4.0–10.5)

## 2017-04-20 LAB — COMPREHENSIVE METABOLIC PANEL
ALT: 24 U/L (ref 17–63)
AST: 17 U/L (ref 15–41)
Albumin: 3.2 g/dL — ABNORMAL LOW (ref 3.5–5.0)
Alkaline Phosphatase: 88 U/L (ref 38–126)
Anion gap: 10 (ref 5–15)
BILIRUBIN TOTAL: 0.4 mg/dL (ref 0.3–1.2)
BUN: 30 mg/dL — ABNORMAL HIGH (ref 6–20)
CHLORIDE: 97 mmol/L — AB (ref 101–111)
CO2: 25 mmol/L (ref 22–32)
CREATININE: 1.05 mg/dL (ref 0.61–1.24)
Calcium: 9.5 mg/dL (ref 8.9–10.3)
GFR calc Af Amer: >60 mL/min (ref >60)
Glucose, Bld: 138 mg/dL — ABNORMAL HIGH (ref 65–99)
POTASSIUM: 5 mmol/L (ref 3.5–5.1)
Sodium: 132 mmol/L — ABNORMAL LOW (ref 135–145)
TOTAL PROTEIN: 7.3 g/dL (ref 6.5–8.1)

## 2017-04-20 LAB — GLUCOSE, CAPILLARY
GLUCOSE-CAPILLARY: 90 mg/dL (ref 65–99)
GLUCOSE-CAPILLARY: 241 mg/dL — AB (ref 65–99)
Glucose-Capillary: 174 mg/dL — ABNORMAL HIGH (ref 65–99)

## 2017-04-20 LAB — HEMOGLOBIN A1C
HEMOGLOBIN A1C: 6.3 % — AB (ref 4.8–5.6)
Mean Plasma Glucose: 134.11 mg/dL

## 2017-04-20 LAB — LACTIC ACID, PLASMA: LACTIC ACID, VENOUS: 0.7 mmol/L (ref 0.5–1.9)

## 2017-04-20 MED ORDER — GLUCERNA SHAKE PO LIQD
237.0000 mL | Freq: Four times a day (QID) | ORAL | Status: DC
  Administered 2017-04-20: 237 mL via ORAL
  Filled 2017-04-20: qty 237

## 2017-04-20 MED ORDER — FLUCONAZOLE 100 MG PO TABS
100.0000 mg | ORAL_TABLET | Freq: Every day | ORAL | Status: DC
  Administered 2017-04-20 – 2017-04-21 (×2): 100 mg
  Filled 2017-04-20 (×2): qty 1

## 2017-04-20 MED ORDER — INSULIN GLARGINE 100 UNIT/ML ~~LOC~~ SOLN
12.0000 [IU] | Freq: Every evening | SUBCUTANEOUS | Status: DC
  Administered 2017-04-20: 12 [IU] via SUBCUTANEOUS
  Filled 2017-04-20 (×2): qty 0.12

## 2017-04-20 MED ORDER — INSULIN ASPART 100 UNIT/ML ~~LOC~~ SOLN: 0.0000 [IU] | Freq: Three times a day (TID) | SUBCUTANEOUS | Status: DC

## 2017-04-20 MED ORDER — FREE WATER
100.0000 mL | Freq: Three times a day (TID) | Status: DC
  Administered 2017-04-20 – 2017-04-22 (×7): 100 mL

## 2017-04-20 MED ORDER — SODIUM CHLORIDE 0.9 % IV SOLN: INTRAVENOUS | Status: AC

## 2017-04-20 MED ORDER — LISINOPRIL 5 MG PO TABS
5.0000 mg | ORAL_TABLET | ORAL | Status: DC
  Administered 2017-04-20: 5 mg
  Filled 2017-04-20: qty 1

## 2017-04-20 MED ORDER — ORAL CARE MOUTH RINSE
15.0000 mL | Freq: Two times a day (BID) | OROMUCOSAL | Status: DC
  Administered 2017-04-20 – 2017-04-21 (×4): 15 mL via OROMUCOSAL

## 2017-04-20 MED ORDER — LORATADINE 10 MG PO TABS
10.0000 mg | ORAL_TABLET | Freq: Every day | ORAL | Status: DC
  Administered 2017-04-20 – 2017-04-22 (×3): 10 mg via ORAL
  Filled 2017-04-20 (×3): qty 1

## 2017-04-20 MED ORDER — ONDANSETRON HCL 4 MG/2ML IJ SOLN: 4.0000 mg | Freq: Four times a day (QID) | INTRAMUSCULAR | Status: DC | PRN

## 2017-04-20 MED ORDER — SODIUM CHLORIDE 0.9 % IV SOLN
500.0000 mg | Freq: Once | INTRAVENOUS | Status: AC
  Administered 2017-04-20: 500 mg via INTRAVENOUS
  Filled 2017-04-20: qty 500

## 2017-04-20 MED ORDER — TERAZOSIN HCL 1 MG PO CAPS
1.0000 mg | ORAL_CAPSULE | Freq: Two times a day (BID) | ORAL | Status: DC
  Administered 2017-04-20 – 2017-04-22 (×5): 1 mg
  Filled 2017-04-20 (×6): qty 1

## 2017-04-20 MED ORDER — SODIUM CHLORIDE 0.9 % IV SOLN
500.0000 mg | Freq: Three times a day (TID) | INTRAVENOUS | Status: DC
  Administered 2017-04-20 – 2017-04-22 (×6): 500 mg via INTRAVENOUS
  Filled 2017-04-20 (×16): qty 500

## 2017-04-20 MED ORDER — ONDANSETRON HCL 4 MG PO TABS: 4.0000 mg | ORAL_TABLET | Freq: Four times a day (QID) | ORAL | Status: DC | PRN

## 2017-04-20 MED ORDER — PNEUMOCOCCAL VAC POLYVALENT 25 MCG/0.5ML IJ INJ
0.5000 mL | INJECTION | INTRAMUSCULAR | Status: AC
  Administered 2017-04-21: 0.5 mL via INTRAMUSCULAR
  Filled 2017-04-20: qty 0.5

## 2017-04-20 MED ORDER — HYDROCODONE-ACETAMINOPHEN 7.5-325 MG PO TABS
1.0000 | ORAL_TABLET | Freq: Four times a day (QID) | ORAL | Status: DC | PRN
  Administered 2017-04-20 – 2017-04-22 (×6): 1 via ORAL
  Filled 2017-04-20 (×7): qty 1

## 2017-04-20 MED ORDER — ALBUTEROL SULFATE HFA 108 (90 BASE) MCG/ACT IN AERS
1.0000 | INHALATION_SPRAY | Freq: Four times a day (QID) | RESPIRATORY_TRACT | Status: DC | PRN
  Filled 2017-04-20: qty 6.7

## 2017-04-20 MED ORDER — OSMOLITE 1.5 CAL PO LIQD
360.0000 mL | ORAL | Status: DC
  Administered 2017-04-20 – 2017-04-21 (×3): 360 mL
  Filled 2017-04-20: qty 474

## 2017-04-20 MED ORDER — INSULIN ASPART 100 UNIT/ML ~~LOC~~ SOLN
0.0000 [IU] | Freq: Four times a day (QID) | SUBCUTANEOUS | Status: DC
  Administered 2017-04-20: 2 [IU] via SUBCUTANEOUS
  Administered 2017-04-21: 5 [IU] via SUBCUTANEOUS
  Administered 2017-04-21 (×2): 7 [IU] via SUBCUTANEOUS

## 2017-04-20 MED ORDER — ENOXAPARIN SODIUM 40 MG/0.4ML ~~LOC~~ SOLN
40.0000 mg | SUBCUTANEOUS | Status: DC
  Administered 2017-04-20 – 2017-04-22 (×3): 40 mg via SUBCUTANEOUS
  Filled 2017-04-20 (×3): qty 0.4

## 2017-04-20 MED ORDER — GLUCERNA SHAKE PO LIQD
237.0000 mL | Freq: Four times a day (QID) | ORAL | Status: DC
  Administered 2017-04-20: 237 mL via ORAL
  Filled 2017-04-20 (×2): qty 237

## 2017-04-20 MED ORDER — PANTOPRAZOLE SODIUM 40 MG PO PACK
40.0000 mg | PACK | Freq: Every day | ORAL | Status: DC
  Administered 2017-04-20 – 2017-04-22 (×3): 40 mg via ORAL
  Filled 2017-04-20 (×4): qty 20

## 2017-04-20 MED ORDER — ALBUTEROL SULFATE (2.5 MG/3ML) 0.083% IN NEBU
2.5000 mg | INHALATION_SOLUTION | Freq: Four times a day (QID) | RESPIRATORY_TRACT | Status: DC | PRN
Start: 2017-04-20 — End: 2017-04-22

## 2017-04-20 MED ORDER — CHLORHEXIDINE GLUCONATE 0.12 % MT SOLN
15.0000 mL | Freq: Two times a day (BID) | OROMUCOSAL | Status: DC
  Administered 2017-04-20 – 2017-04-22 (×5): 15 mL via OROMUCOSAL
  Filled 2017-04-20 (×5): qty 15

## 2017-04-20 MED ORDER — SODIUM CHLORIDE 0.9 % IV SOLN
INTRAVENOUS | Status: DC
  Administered 2017-04-20: 06:00:00 via INTRAVENOUS

## 2017-04-20 MED ORDER — INSULIN ASPART 100 UNIT/ML ~~LOC~~ SOLN
0.0000 [IU] | Freq: Four times a day (QID) | SUBCUTANEOUS | Status: DC
  Administered 2017-04-20: 5 [IU] via SUBCUTANEOUS
  Filled 2017-04-20: qty 1

## 2017-04-20 MED ORDER — SENNA 8.6 MG PO TABS
1.0000 | ORAL_TABLET | Freq: Every evening | ORAL | Status: DC | PRN
  Administered 2017-04-21: 8.6 mg
  Filled 2017-04-20 (×2): qty 1

## 2017-04-20 MED ORDER — INSULIN GLARGINE 100 UNIT/ML ~~LOC~~ SOLN
25.0000 [IU] | Freq: Every evening | SUBCUTANEOUS | Status: DC
  Filled 2017-04-20: qty 0.25

## 2017-04-20 MED ORDER — TRAZODONE HCL 50 MG PO TABS
50.0000 mg | ORAL_TABLET | Freq: Every day | ORAL | Status: DC
  Administered 2017-04-20 – 2017-04-21 (×2): 50 mg via ORAL
  Filled 2017-04-20 (×2): qty 1

## 2017-04-20 MED ORDER — LORAZEPAM 1 MG PO TABS
1.0000 mg | ORAL_TABLET | Freq: Four times a day (QID) | ORAL | Status: DC | PRN
  Administered 2017-04-21: 1 mg via ORAL
  Filled 2017-04-20: qty 1

## 2017-04-20 NOTE — H&P (Addendum)
TRH H&P    Patient Demographics:    Colin Wagner, is a 66 y.o. male  MRN: [id number]  DOB - February 15, 1951  Admit Date - 04/19/2017  Referring MD/NP/PA: Dr Wyvonnia Dusky  Outpatient Primary MD for the patient is [name], MD  Patient coming from: Home  Chief Complaint  Patient presents with  . Fever      HPI:    Colin Wagner  is a 66 y.o. 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 Patient family states that he also had fever but never checked his temperature, they felt he was warm. Patient apparently had Klebsiella pneumoniae UTI growing in urine culture obtained on 04/08/2017, which has been multidrug resistant and only sensitive to imipenem. He was treated with oral antibiotics, and told to come to ED if became symptomatic. Patient currently has Foley catheter inserted for urinary retention. He denies chest pain, no shortness of breath. No abdominal pain. No headache. No blurred vision.    Review of systems:      All other systems reviewed and are negative.   With Past History of the following :    Past Medical History:  Diagnosis Date  . Alcohol abuse   . Alcoholic pancreatitis 7846   admission  . Chronic back pain   . Chronic pancreatitis ([place])    based on ct findings 2016  . COPD (chronic obstructive pulmonary disease) ([place])   . Diabetes mellitus   . Diverticulosis   . Gastritis   . GERD (gastroesophageal reflux disease)   . Headache   . Hyperlipidemia   . Hypertension   . Peptic ulcer disease 1999   Per medical reports, no H pylori  . Renal cancer, left ([place]) 2012   he tells me that he has been released, ?and that he is free of cancer, and never had it to begin with.   . Right shoulder pain   .  Testicular hypofunction       Past Surgical History:  Procedure Laterality Date  . COLONOSCOPY  2003   Dr. [name], polyps  . COLONOSCOPY  2005   Dr. [name], multiple diverticula  . COLONOSCOPY  2008   Dr. [name], diverticulosis  . COLONOSCOPY WITH PROPOFOL N/A 10/21/2016   Procedure: COLONOSCOPY WITH PROPOFOL;  Surgeon: [name], MD;  [facility]: [facility];  Service: Endoscopy;  Laterality: N/A;  8:30 am  . ESOPHAGOGASTRODUODENOSCOPY     Multiple EGDs. 1999 EGD showed gastric ulcers, no H pylori and benign biopsies performed by Dr. [name]. 2001 gastric ulcer healed. Last EGD 2005 had gastritis.  Marland Kitchen LUNG BIOPSY    . PARTIAL NEPHRECTOMY Left 2012  . POLYPECTOMY  10/21/2016   Procedure: POLYPECTOMY;  Surgeon: [name], MD;  Location: [facility];  Service: Endoscopy;;  hepatic flexure x2      Social History:      Social History  Substance Use Topics  . Smoking status: Former Smoker    Packs/day: 0.50    Years: 35.00  . Smokeless tobacco: Never Used     Comment: Only smoking a few cigarettes now  . Alcohol use No     Comment: quit 2000 but relapse in 2016. no etoh since hospitalized 2016.        Family History :     Family History  Problem Relation Age of Onset  . Hypertension Mother   . Colon cancer Neg Hx       Home Medications:   Prior to Admission medications   Medication Sig Start Date End Date Taking? Authorizing Provider  Acetaminophen (TYLENOL 8 HOUR PO) 2 tablets by Feeding Tube route 2 (two) times daily as needed.    Yes [provider]  albuterol (PROVENTIL HFA;VENTOLIN HFA) 108 (90 Base) MCG/ACT inhaler Inhale 1-2 puffs into the lungs every 6 (six) hours as needed for wheezing or shortness of breath. 09/25/16  Yes Nat Christen, MD  cetirizine (ZYRTEC) 10 MG tablet Place 10 mg into feeding tube at bedtime.  03/31/15  Yes [provider]  fluconazole (DIFLUCAN) 100 MG tablet Place 1 tablet into feeding tube daily. 7  day course starting on 04/15/2017   Yes [provider]  glipiZIDE (GLUCOTROL) 5 MG tablet Place 5 mg into feeding tube daily as needed (for high blood sugar). PT said he takes it as needed ( if BS gets up to 200 he said)    Yes [provider]  HYDROcodone-acetaminophen (NORCO) 7.5-325 MG tablet Take 1 tablet by mouth every 6 (six) hours as needed (pain). Patient taking differently: 1 tablet every 6 (six) hours as needed (pain). Per feeding tube 01/12/17  Yes [name], MD  LANTUS 100 UNIT/ML injection Inject 25 Units into the skin every evening.  03/09/17  Yes [provider]  lisinopril (PRINIVIL,ZESTRIL) 5 MG tablet Place 5 mg into feeding tube every morning.  04/02/15  Yes [provider]  LORazepam (ATIVAN) 1 MG tablet Take 1 mg by mouth 4 (four) times daily as needed for anxiety. For anxiety 03/31/15  Yes [provider]  NOVOLOG 100 UNIT/ML injection Inject 5 Units into the skin every 4 (four) hours.  03/09/17  Yes [provider]  pantoprazole (PROTONIX) 20 MG tablet 20 mg daily. Per feeding tube   Yes [provider]  senna (SENOKOT) 8.6 MG TABS tablet Take 1 tablet (8.6 mg total) by mouth at bedtime as needed for mild constipation. May be needed as pain medication can constipate. Patient taking differently: Place 1 tablet into feeding tube at bedtime as needed for mild constipation. May be needed as pain medication can constipate. 01/12/17  Yes [name], MD  terazosin (HYTRIN) 1 MG capsule Place 1 capsule into feeding tube 2 (two) times daily.  03/30/17  Yes [provider]  traZODone (DESYREL) 50 MG tablet Take 50 mg by mouth at bedtime.   Yes [provider]  cephALEXin (KEFLEX) 500 MG capsule Take 1 capsule (500 mg total) by mouth 4 (four) times daily. Patient not taking: Reported on 04/19/2017 04/08/17   Francine Graven, DO     Allergies:    No Known Allergies   Physical Exam:   Vitals  Blood  pressure 134/74, pulse 72, temperature 98.7 F (37.1 C), temperature source Oral, resp. rate 17, height 5\' 11"  (1.803 m), weight 73 kg (161 lb), SpO2 98 %.  1.  General: Appears in no acute distress  2. Psychiatric:  Intact judgement and  insight, awake alert, oriented x 3.  3. Neurologic: No focal neurological deficits, all cranial nerves intact.Strength 5/5 all 4 extremities, sensation intact all 4 extremities, plantars down going.  4. Eyes :  anicteric sclerae, moist conjunctivae with no lid lag. PERRLA.  5. ENMT:  Left side submandibular mass noted, oropharynx has white patches.  6. Neck:  supple, no cervical lymphadenopathy appriciated, No thyromegaly  7. Respiratory : Normal respiratory effort, good air movement bilaterally,clear to  auscultation bilaterally  8. Cardiovascular : RRR, no gallops, rubs or murmurs, no leg edema  9. Gastrointestinal:  Positive bowel sounds, abdomen soft, non-tender to palpation,no hepatosplenomegaly, no rigidity or guarding       10. Skin:  No cyanosis, normal texture and turgor, no rash, lesions or ulcers  11.Musculoskeletal:  Good muscle tone,  joints appear normal , no effusions,  normal range of motion    Data Review:    CBC  Recent Labs Lab 04/19/17 2137  WBC 10.2  HGB 9.1*  HCT 28.1*  PLT 330  MCV 79.2  MCH 25.6*  MCHC 32.4  RDW 15.4  LYMPHSABS 1.3  MONOABS 0.5  EOSABS 0.1  BASOSABS 0.0   ------------------------------------------------------------------------------------------------------------------  Chemistries   Recent Labs Lab 04/19/17 2137  NA 132*  K 4.8  CL 95*  CO2 28  GLUCOSE 173*  BUN 39*  CREATININE 1.17  CALCIUM 9.6  AST 17  ALT 23  ALKPHOS 95  BILITOT 0.2*    ------------------------------------------------------------------------------------------------------------------  ------------------------------------------------------------------------------------------------------------------ GFR: Estimated Creatinine Clearance: 64.1 mL/min (by C-G formula based on SCr of 1.17 mg/dL). Liver Function Tests:  Recent Labs Lab 04/19/17 2137  AST 17  ALT 23  ALKPHOS 95  BILITOT 0.2*  PROT 7.7  ALBUMIN 3.4*    Recent Labs Lab 04/19/17 2137  LIPASE 24   No results for input(s): AMMONIA in the last 168 hours. Coagulation Profile:  Recent Labs Lab 04/19/17 2137  INR 1.08   Cardiac Enzymes: No results for input(s): CKTOTAL, CKMB, CKMBINDEX, TROPONINI in the last 168 hours. BNP (last 3 results) No results for input(s): PROBNP in the last 8760 hours. HbA1C: No results for input(s): HGBA1C in the last 72 hours. CBG: No results for input(s): GLUCAP in the last 168 hours. Lipid Profile: No results for input(s): CHOL, HDL, LDLCALC, TRIG, CHOLHDL, LDLDIRECT in the last 72 hours. Thyroid Function Tests: No results for input(s): TSH, T4TOTAL, FREET4, T3FREE, THYROIDAB in the last 72 hours. Anemia Panel: No results for input(s): VITAMINB12, FOLATE, FERRITIN, TIBC, IRON, RETICCTPCT in the last 72 hours.  --------------------------------------------------------------------------------------------------------------- Urine analysis:    Component Value Date/Time   COLORURINE YELLOW 04/19/2017 2051   APPEARANCEUR HAZY (A) 04/19/2017 2051   LABSPEC 1.012 04/19/2017 2051   PHURINE 8.0 04/19/2017 2051   GLUCOSEU NEGATIVE 04/19/2017 2051   HGBUR NEGATIVE 04/19/2017 2051   [place] NEGATIVE 04/19/2017 2051   KETONESUR NEGATIVE 04/19/2017 2051   PROTEINUR NEGATIVE 04/19/2017 2051   UROBILINOGEN 0.2 04/27/2015 1916   NITRITE NEGATIVE 04/19/2017 2051   LEUKOCYTESUR LARGE (A) 04/19/2017 2051      Imaging Results:    Ct Soft Tissue Neck W  Contrast  Result Date: 04/20/2017 CLINICAL DATA:  Fever. Jaw swelling. History of head and neck cancer. EXAM: CT NECK WITH CONTRAST TECHNIQUE: Multidetector CT imaging of the neck was performed using the standard protocol following the bolus administration of intravenous contrast. CONTRAST:  170mL ISOVUE-300 IOPAMIDOL (ISOVUE-300) INJECTION 61% COMPARISON:  CT neck March 19, 2017 FINDINGS:  PHARYNX AND LARYNX: Postsurgical changes for section of LEFT retromolar trigone mass with flap. Narrowed though patent LEFT internal jugular vein. Granulation tissue LEFT neck contiguous with the LEFT sternocleidomastoid muscle. Status post LEFT neck dissection. No focal fluid collection, or acute inflammatory changes. Patent airway. SALIVARY GLANDS: Resected LEFT submandibular gland. Mildly hypertrophic LEFT sublingual gland. Surgical clips LEFT parotid gland with possible partial LEFT parotidectomy. THYROID: Normal. LYMPH NODES: No lymphadenopathy by CT size criteria. VASCULAR: Mild calcific atherosclerosis aortic arch and carotid bifurcations. Approximate 50% stenosis RIGHT internal carotid artery on this non angiographic phase. LIMITED INTRACRANIAL: Normal. VISUALIZED ORBITS: Normal. MASTOIDS AND VISUALIZED PARANASAL SINUSES: Resected LEFT posterior maxillary antrum, well-aerated surgical bed extending into LEFT infratemporal fossa. Surgical clip LEFT pterygopalatine fossa. Paranasal sinus are well aerated. Small LEFT mastoid effusion without air cell coalescence. SKELETON: LEFT mandible plate and screw fixation with reconstruction, intact well-seated hardware. Slight offset of the posterior fusion at the angle of the mandible. Nondisplaced RIGHT nasal bone fracture. UPPER CHEST: Lung apices are clear. No superior mediastinal lymphadenopathy. OTHER: None. IMPRESSION: 1. No acute process in the neck. 2. Status post LEFT partial maxillectomy and reconstructive changes of LEFT mandible for head and neck tumor. LEFT upper neck  flap and LEFT modified radical neck dissection. No CT findings of residual or recurrent tumor though, MRI would be more sensitive in the setting of postoperative change. 3. Atherosclerosis resulting in suspected 50% stenosis RIGHT internal carotid artery on this non angiographic phase. Electronically Signed   By: Elon Alas M.D.   On: 04/20/2017 00:25   Ct Abdomen Pelvis W Contrast  Result Date: 04/20/2017 CLINICAL DATA:  Abdominal pain, onset today.  Fevers. EXAM: CT ABDOMEN AND PELVIS WITH CONTRAST TECHNIQUE: Multidetector CT imaging of the abdomen and pelvis was performed using the standard protocol following bolus administration of intravenous contrast. CONTRAST:  145mL ISOVUE-300 IOPAMIDOL (ISOVUE-300) INJECTION 61% COMPARISON:  01/12/2017, 05/28/2015. FINDINGS: Lower chest: No acute abnormality. Hepatobiliary: No focal liver abnormality is seen. No gallstones, gallbladder wall thickening, or biliary dilatation. Pancreas: Pancreatic calcifications and mild atrophy consistent with chronic pancreatitis. No evidence of acute pancreatitis. No pancreatic parenchymal mass or cyst. Spleen: Normal in size without focal abnormality. Adrenals/Urinary Tract: Adrenal glands are unremarkable. Kidneys are normal, without renal calculi, significant focal lesion, or hydronephrosis. Bladder is remarkable only for Foley catheter. Stomach/Bowel: Percutaneous gastrostomy appears satisfactorily positioned. Stomach and small bowel are otherwise unremarkable. Normal appendix. Colonic diverticulosis without evidence of acute diverticulitis. Vascular/Lymphatic: The abdominal aorta is normal in caliber with moderate atherosclerotic calcification. No adenopathy is evident in the abdomen or pelvis. Reproductive: Unremarkable Other: No focal inflammation.  No ascites. Musculoskeletal: No significant skeletal lesion. IMPRESSION: 1. No acute findings are evident in the abdomen or pelvis. 2. Chronic pancreatitis. 3. Colonic  diverticulosis. Electronically Signed   By: Andreas Newport M.D.   On: 04/20/2017 00:56       Assessment & Plan:    Active Problems:   DM type 2 (diabetes mellitus, type 2) ([facility])   Carcinoma of buccal mucosa ([facility])   UTI (urinary tract infection)   1. MDR Klebsiella pneumoniae UTI- we'll start imipenem per pharmacy consultation. Repeat urine culture has been obtained. Follow urine culture results. 2. Diabetes mellitus- continue Lantus 25 units subcutaneous daily, sliding scale insulin with NovoLog. 3. Nausea/ vomiting-resolved patient only had one episode of vomiting at home. Continue Zofran when necessary for nausea and vomiting. 4. Nutrition-patient has PEG tube in place, he takes  Glucerna at home. Will start Glucerna  237 ml, every 6 hours. 5. Carcinoma buccal mucosa-status post surgery and radiation.  6. Oropharyngeal candidiasis-patient is currently on Diflucan started on 04/15/2017, for total 7 days. Continue Diflucan for 2 more days.   DVT Prophylaxis-   Lovenox   AM Labs Ordered, also please review Full Orders  Family Communication: Admission, patients condition and plan of care including tests being ordered have been discussed with the patient  who indicate understanding and agree with the plan and Code Status.  Code Status:  Full code  Admission status: Inpatient    Time spent in minutes : 60 minutes   Wyn Nettle S M.D on 04/20/2017 at 3:17 AM  Between 7am to 7pm - Pager - [phone]. After 7pm go to [email or web address] - password [facility]  [facility] - Office  [phone]

## 2017-04-20 NOTE — Progress Notes (Addendum)
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 The patient has had problems with urinary retention. He had a Foley catheter placed in the emergency department on 04/08/2017. The patient was sent home with cephalexin at that time. He followed up with [facility] on 04/14/2017. His cephalexin was stopped at that time. He followed up with his primary care provider, Dr. Karie Kirks, on 04/15/17 who placed the patient on fluconazole for thrush. The patient was doing relatively well until he began having nausea and vomiting on 04/19/2017 with associated subjective fevers and chills.  Upon presentation, the patient was found to have WBC 10.2. He remains afebrile and hemodynamically stable. 04/08/2017-urine culture grows ESBL Klebsiella pneumoniae.  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 Vertell Limber, at [facility] next week  Left flank wound -it is the site of where muscle flap was obtained -continue wet to dry bid -no signs of infection  Thrush -one more day fluconazole   Disposition Plan:   Home in 1-2  days  Family Communication:   Girlfriend updated at bedside--Total time spent 35 minutes.  Greater than 50% spent face to face counseling and coordinating care. 0905 to [place]:  none  Code Status:  FULL   DVT Prophylaxis: [place] Lovenox   Procedures: As Listed in Progress Note Above  Antibiotics: Imipenem 8/29>>>    Subjective: Patient denies fevers, chills, headache, chest pain, dyspnea, nausea, vomiting, diarrhea, abdominal pain, dysuria, hematuria, hematochezia, and melena.   Objective: Vitals:   04/20/17 0711 04/20/17 0730 04/20/17 0802 04/20/17 0814  BP:  125/64  124/62  Pulse:  82  86  Resp: 20  20 20   Temp:    98.7 F (37.1 C)  TempSrc:    Oral  SpO2:  94%  99%  Weight:    74.4 kg (164 lb)  Height:    5\' 11"  (1.803 m)    Intake/Output Summary (Last 24 hours) at 04/20/17 0350 Last data filed at 04/20/17 0557  Gross per 24 hour  Intake              400 ml  Output             1300 ml  Net             -900 ml   Weight change:  Exam:   General:  Pt is alert, follows commands appropriately, not in acute distress  HEENT: No icterus, No thrush, No neck mass, [place]/AT  Cardiovascular: RRR, S1/S2, no rubs, no gallops  Respiratory: CTA bilaterally, no wheezing, no crackles, no rhonchi  Abdomen: Soft/+BS, non tender, non distended, no guarding  Extremities: No edema, No lymphangitis, No petechiae, No rashes, no  synovitis;  Left flank wound without erythema, drainage, necrosis--good granulation tissue   Data Reviewed: I have personally reviewed following labs and imaging studies Basic Metabolic Panel:  Recent Labs Lab 04/19/17 2137 04/20/17 0547  NA 132* 132*  K 4.8 5.0  CL 95* 97*  CO2 28 25  GLUCOSE 173* 138*  BUN 39* 30*  CREATININE 1.17 1.05  CALCIUM 9.6 9.5   Liver Function Tests:  Recent Labs Lab 04/19/17 2137 04/20/17 0547  AST 17 17  ALT 23 24  ALKPHOS 95 88  BILITOT 0.2* 0.4  PROT 7.7 7.3  ALBUMIN 3.4* 3.2*    Recent  Labs Lab 04/19/17 2137  LIPASE 24   No results for input(s): AMMONIA in the last 168 hours. Coagulation Profile:  Recent Labs Lab 04/19/17 2137  INR 1.08   CBC:  Recent Labs Lab 04/19/17 2137 04/20/17 0547  WBC 10.2 8.5  NEUTROABS 8.3*  --   HGB 9.1* 9.1*  HCT 28.1* 27.8*  MCV 79.2 79.0  PLT 330 307   Cardiac Enzymes: No results for input(s): CKTOTAL, CKMB, CKMBINDEX, TROPONINI in the last 168 hours. BNP: Invalid input(s): POCBNP CBG:  Recent Labs Lab 04/20/17 0555  GLUCAP 150*   HbA1C: No results for input(s): HGBA1C in the last 72 hours. Urine analysis:    Component Value Date/Time   COLORURINE YELLOW 04/19/2017 2051   APPEARANCEUR HAZY (A) 04/19/2017 2051   LABSPEC 1.012 04/19/2017 2051   PHURINE 8.0 04/19/2017 2051   GLUCOSEU NEGATIVE 04/19/2017 2051   HGBUR NEGATIVE 04/19/2017 2051   BILIRUBINUR NEGATIVE 04/19/2017 2051   KETONESUR NEGATIVE 04/19/2017 2051   PROTEINUR NEGATIVE 04/19/2017 2051   UROBILINOGEN 0.2 04/27/2015 1916   NITRITE NEGATIVE 04/19/2017 2051   LEUKOCYTESUR LARGE (A) 04/19/2017 2051   Sepsis Labs: @LABRCNTIP (procalcitonin:4,lacticidven:4) ) Recent Results (from the past 240 hour(s))  Culture, blood (Routine x 2)     Status: None (Preliminary result)   Collection Time: 04/19/17  9:37 PM  Result Value Ref Range Status   Specimen Description BLOOD LEFT ARM  Final   Special Requests   Final    BOTTLES DRAWN AEROBIC AND ANAEROBIC Blood Culture adequate volume   Culture NO GROWTH < 12 HOURS  Final   Report Status PENDING  Incomplete  Culture, blood (Routine x 2)     Status: None (Preliminary result)   Collection Time: 04/19/17  9:45 PM  Result Value Ref Range Status   Specimen Description BLOOD LEFT ARM  Final   Special Requests   Final    BOTTLES DRAWN AEROBIC AND ANAEROBIC Blood Culture adequate volume   Culture NO GROWTH < 12 HOURS  Final   Report Status PENDING  Incomplete     Scheduled Meds: . enoxaparin (LOVENOX)  injection  40 mg Subcutaneous Q24H  . feeding supplement (GLUCERNA SHAKE)  237 mL Oral Q6H  . fluconazole  100 mg Per Tube Daily  . free water  100 mL Per Tube Q8H  . insulin aspart  0-9 Units Subcutaneous Q6H  . insulin glargine  25 Units Subcutaneous QPM  . loratadine  10 mg Oral Daily  . pantoprazole sodium  40 mg Oral Daily  . [START ON 04/21/2017] pneumococcal 23 valent vaccine  0.5 mL Intramuscular Tomorrow-1000  . terazosin  1 mg Per Tube BID  . traZODone  50 mg Oral QHS   Continuous Infusions: . sodium chloride 75 mL/hr at 04/20/17 0544  . imipenem-cilastatin      Procedures/Studies: Ct Soft Tissue Neck W Contrast  Result Date: 04/20/2017 CLINICAL DATA:  Fever. Jaw swelling. History of head and neck cancer. EXAM: CT NECK WITH CONTRAST TECHNIQUE: Multidetector CT imaging of the neck was performed using the standard protocol following the bolus administration of intravenous contrast. CONTRAST:  144mL ISOVUE-300 IOPAMIDOL (ISOVUE-300) INJECTION 61% COMPARISON:  CT neck March 19, 2017 FINDINGS: PHARYNX AND LARYNX: Postsurgical changes for section of LEFT retromolar trigone mass with flap. Narrowed though patent LEFT internal jugular vein. Granulation tissue LEFT neck contiguous with the LEFT sternocleidomastoid muscle. Status post LEFT neck dissection. No focal fluid collection, or acute inflammatory changes. Patent airway. SALIVARY GLANDS: Resected LEFT submandibular gland. Mildly hypertrophic LEFT sublingual gland. Surgical clips LEFT parotid gland with possible partial LEFT parotidectomy. THYROID: Normal. LYMPH NODES: No lymphadenopathy by CT size criteria. VASCULAR: Mild calcific atherosclerosis aortic arch and carotid bifurcations. Approximate 50% stenosis RIGHT internal carotid artery on this non angiographic phase. LIMITED INTRACRANIAL: Normal. VISUALIZED ORBITS: Normal. MASTOIDS AND VISUALIZED PARANASAL SINUSES: Resected LEFT posterior maxillary antrum, well-aerated surgical bed  extending into LEFT infratemporal fossa. Surgical clip LEFT pterygopalatine fossa. Paranasal sinus are well aerated. Small LEFT mastoid effusion without air cell coalescence. SKELETON: LEFT mandible plate and screw fixation with reconstruction, intact well-seated hardware. Slight offset of the posterior fusion at the angle of the mandible. Nondisplaced RIGHT nasal bone fracture. UPPER CHEST: Lung apices are clear. No superior mediastinal lymphadenopathy. OTHER: None. IMPRESSION: 1. No acute process in the neck. 2. Status post LEFT partial maxillectomy and reconstructive changes of LEFT mandible for head and neck tumor. LEFT upper neck flap and LEFT modified radical neck dissection. No CT findings of residual or recurrent tumor though, MRI would be more sensitive in the setting of postoperative change. 3. Atherosclerosis resulting in suspected 50% stenosis RIGHT internal carotid artery on this non angiographic phase. Electronically Signed   By: Elon Alas M.D.   On: 04/20/2017 00:25   Ct Abdomen Pelvis W Contrast  Result Date: 04/20/2017 CLINICAL DATA:  Abdominal pain, onset today.  Fevers. EXAM: CT ABDOMEN AND PELVIS WITH CONTRAST TECHNIQUE: Multidetector CT imaging of the abdomen and pelvis was performed using the standard protocol following bolus administration of intravenous contrast. CONTRAST:  146mL ISOVUE-300 IOPAMIDOL (ISOVUE-300) INJECTION 61% COMPARISON:  01/12/2017, 05/28/2015. FINDINGS: Lower chest: No acute abnormality. Hepatobiliary: No focal liver abnormality is seen. No gallstones, gallbladder wall thickening, or biliary dilatation. Pancreas: Pancreatic calcifications and mild atrophy consistent with chronic pancreatitis. No evidence of acute pancreatitis. No pancreatic parenchymal mass or cyst. Spleen: Normal in size without focal abnormality. Adrenals/Urinary Tract: Adrenal glands are unremarkable. Kidneys are normal, without renal calculi, significant focal lesion, or hydronephrosis.  Bladder is remarkable only for Foley catheter. Stomach/Bowel: Percutaneous gastrostomy appears satisfactorily positioned. Stomach and small bowel are otherwise unremarkable. Normal appendix. Colonic diverticulosis without evidence of acute diverticulitis. Vascular/Lymphatic: The abdominal aorta is normal in caliber with moderate atherosclerotic calcification. No adenopathy is evident in the abdomen or pelvis. Reproductive: Unremarkable Other: No focal inflammation.  No ascites. Musculoskeletal: No significant skeletal lesion. IMPRESSION: 1. No acute findings are evident in the abdomen or pelvis. 2. Chronic pancreatitis. 3. Colonic diverticulosis. Electronically Signed   By: Andreas Newport M.D.   On: 04/20/2017 00:56    Arneda Sappington, DO  [facility] Pager [phone]  If 7PM-7AM, please contact night-coverage [email or web address] Password [email or web address] 04/20/2017, 9:22 AM   LOS: 0 days

## 2017-04-20 NOTE — Progress Notes (Signed)
Initial Nutrition Assessment  DOCUMENTATION CODES:   Non-severe (moderate) malnutrition in context of chronic illness  INTERVENTION:  Adjust bolus feedings:  -Discontinue Glucerna Shakes which are not meeting est needs - which is providing 800 kcal, 40 gr protein and   -Start Osmolite 1.5 bolus feedings of 360 ml QID and advance as tolerated to 474 ml TID. At goal tube feedings provide: 2133 kcal, 89 gr protein, 1086 water from formula.  -Free water flushes before and after meds and stopping or administering bolus feedings per protocol.- IV fluids @ 75 ml/hr and 100 ml free water q 8 hrs.  -Pt will need additional - free water if not receiving IVF.  NUTRITION DIAGNOSIS:   Malnutrition (non severe) related to catabolic illness (throat cancer) as evidenced by moderate depletions of muscle mass, mild depletion of body fat, mild depletion of muscle mass.  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 He is to follow up with [facility] next week according to his significant other. He presents with c/o vomiting and found to have UTI.   Diet hx: the patient was assessed by ST at [facility] but would like to resume services at a closer location. He reports home diet had been advanced to pureed foods and expresses today that he would really like to be allowed po nutrition. He is being treated for thrush (1 more day of fluconazole) but says his mouth is feeling better.   According to pt PEG tube was placed approximately 1 month ago and he has been receiving bolus feedings of Glucerna 1.5 - 6 cans per day. The significant other says she "tries"  to work them in between Dr appointments etc. She says his wt has been stable since beginning the eneral feedings. They are open to have his bolus schedule adjusted to improve efficiency time managment and allow for him to sleep without interruption.    Weight hx: his usual wt range 85-87 kg. 1/29- 189# 7/22-178# 8/29- 164# Significant wt loss of 8% in 1 month and 13% in 7 months.  Nutrition-Focused physical exam completed. Findings are mild upper arms fat depletion, mild temporal and moderate muscle depletion (clavicle, acromion, patellar and calves) and no edema.      Recent Labs Lab 04/19/17 2137 04/20/17 0547  NA 132* 132*  K 4.8 5.0  CL 95* 97*  CO2 28 25  BUN 39* 30*  CREATININE 1.17 1.05  CALCIUM 9.6 9.5  GLUCOSE 173* 138*   Labs: sodium 132 and glu 138  Meds: SSI, PPI   Diet Order:  Diet NPO time specified  Skin:  Left flank, surgical site. healing stage I  Last BM:  unknown  Height:   Ht Readings from Last 1 Encounters:  04/20/17 5\' 11"  (1.803 m)    Weight:   Wt Readings from Last 1 Encounters:  04/20/17 164 lb (74.4 kg)    Ideal Body Weight:  78 kg  BMI:  Body mass index is 22.87 kg/m.  Estimated Nutritional Needs:   Kcal:  7619-5093 (30-33 kcal/kg)  Protein:  89-111 gr (1.2-1.5 gr/kg)  Fluid:  2.2 liters daily  EDUCATION NEEDS:  Addressed related to the importance of providing total nutrition support prescription daily    Colman Cater MS,RD,CSG,LDN Office: #[phone] Pager: [phone]

## 2017-04-20 NOTE — Progress Notes (Signed)
ANTIBIOTIC CONSULT NOTE-Preliminary  Pharmacy Consult for imipenem/cilistatin Indication: UTI  No Known Allergies  Patient Measurements: Height: 5\' 11"  (180.3 cm) Weight: 161 lb (73 kg) IBW/kg (Calculated) : 75.3 Adjusted Body Weight:   Vital Signs: Temp: 98.7 F (37.1 C) (08/28 2049) Temp Source: Oral (08/28 2049) BP: 134/74 (08/29 0248) Pulse Rate: 72 (08/29 0248)  Labs:  Recent Labs  04/19/17 2137  WBC 10.2  HGB 9.1*  PLT 330  CREATININE 1.17    Estimated Creatinine Clearance: 64.1 mL/min (by C-G formula based on SCr of 1.17 mg/dL).  No results for input(s): VANCOTROUGH, VANCOPEAK, VANCORANDOM, GENTTROUGH, GENTPEAK, GENTRANDOM, TOBRATROUGH, TOBRAPEAK, TOBRARND, AMIKACINPEAK, AMIKACINTROU, AMIKACIN in the last 72 hours.   Microbiology: Recent Results (from the past 720 hour(s))  Urine culture     Status: Abnormal   Collection Time: 04/08/17  9:38 PM  Result Value Ref Range Status   Specimen Description URINE, CLEAN CATCH  Final   Special Requests NONE  Final   Culture (A)  Final    >=100,000 COLONIES/mL KLEBSIELLA PNEUMONIAE Confirmed Extended Spectrum Beta-Lactamase Producer (ESBL) Performed at [facility], [address], [place], [place] [address]    Report Status 04/12/2017 FINAL  Final   Organism ID, Bacteria KLEBSIELLA PNEUMONIAE (A)  Final      Susceptibility   Klebsiella pneumoniae - MIC*    AMPICILLIN >=32 RESISTANT Resistant     CEFAZOLIN >=64 RESISTANT Resistant     CEFTRIAXONE >=64 RESISTANT Resistant     CIPROFLOXACIN 2 INTERMEDIATE Intermediate     GENTAMICIN <=1 SENSITIVE Sensitive     IMIPENEM <=0.25 SENSITIVE Sensitive     NITROFURANTOIN 64 INTERMEDIATE Intermediate     TRIMETH/SULFA >=320 RESISTANT Resistant     AMPICILLIN/SULBACTAM >=32 RESISTANT Resistant     PIP/TAZO 32 INTERMEDIATE Intermediate     Extended ESBL POSITIVE Resistant     * >=100,000 COLONIES/mL KLEBSIELLA PNEUMONIAE  Culture, blood (Routine x 2)     Status:  None (Preliminary result)   Collection Time: 04/19/17  9:37 PM  Result Value Ref Range Status   Specimen Description BLOOD LEFT ARM  Final   Special Requests   Final    BOTTLES DRAWN AEROBIC AND ANAEROBIC Blood Culture adequate volume   Culture PENDING  Incomplete   Report Status PENDING  Incomplete  Culture, blood (Routine x 2)     Status: None (Preliminary result)   Collection Time: 04/19/17  9:45 PM  Result Value Ref Range Status   Specimen Description BLOOD LEFT ARM  Final   Special Requests   Final    BOTTLES DRAWN AEROBIC AND ANAEROBIC Blood Culture adequate volume   Culture PENDING  Incomplete   Report Status PENDING  Incomplete    Medical History: Past Medical History:  Diagnosis Date  . Alcohol abuse   . 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 Chronic back pain   . Chronic pancreatitis ([place])    based on ct findings 2016  . COPD (chronic obstructive pulmonary disease) ([place])   . Diabetes mellitus   . Diverticulosis   . Gastritis   . GERD (gastroesophageal reflux disease)   . Headache   . Hyperlipidemia   . Hypertension   . Peptic ulcer disease 1999   Per medical reports, no H pylori  . Renal cancer, left ([place]) 2012   he tells me that he has been released, ?and that he is free of cancer, and never had it to begin with.   . Right shoulder pain   .  Testicular hypofunction     Medications:   (Not in a hospital admission) Scheduled:  . enoxaparin (LOVENOX) injection  40 mg Subcutaneous Q24H  . feeding supplement (GLUCERNA SHAKE)  237 mL Oral Q6H  . fluconazole  100 mg Per Tube Daily  . free water  100 mL Per Tube Q8H  . insulin aspart  0-9 Units Subcutaneous TID WC  . insulin glargine  25 Units Subcutaneous QPM  . lisinopril  5 mg Per Tube [id number]  . loratadine  10 mg Oral Daily  . pantoprazole  20 mg Oral Daily  . terazosin  1 mg Per Tube BID  . traZODone  50 mg Oral QHS   Infusions:  . sodium chloride    . imipenem-cilastatin (PRIMAXIN) 500 MG IV  (MINIBAG PLUS)     PRN: albuterol, HYDROcodone-acetaminophen, LORazepam, ondansetron **OR** ondansetron (ZOFRAN) IV, senna Anti-infectives    Start     Dose/Rate Route Frequency Ordered Stop   04/20/17 1000  fluconazole (DIFLUCAN) tablet 100 mg    Comments:  7 day course starting on 04/15/2017     100 mg Per Tube Daily 04/20/17 0318     04/20/17 0630  imipenem-cilastatin (PRIMAXIN) 500 mg in sodium chloride 0.9 % 100 mL IVPB     500 mg 200 mL/hr over 30 Minutes Intravenous  Once 04/20/17 0340     04/19/17 2330  cefTRIAXone (ROCEPHIN) 1 g in dextrose 5 % 50 mL IVPB  Status:  Discontinued     1 g 100 mL/hr over 30 Minutes Intravenous  Once 04/19/17 2323 04/19/17 2324   04/19/17 2330  imipenem-cilastatin (PRIMAXIN) 500 mg in sodium chloride 0.9 % 100 mL IVPB     500 mg 200 mL/hr over 30 Minutes Intravenous  Once 04/19/17 2324 04/20/17 0056      Assessment: 66 yo male with ESBL klebsiella UTI sensitive to imipenem. Received 1 dose in ED, asked to continue.    Plan:  Preliminary review of pertinent patient information completed.  Protocol will be initiated with dose(s) of imipenem/cilastatin 500mg  IV x 1 6 hours after first dose given at [place].  Forestine Na clinical pharmacist will complete review during morning rounds to assess patient and finalize treatment regimen if needed.  Addilynne Olheiser Scarlett, RPH 04/20/2017,3:40 AM

## 2017-04-20 NOTE — ED Provider Notes (Signed)
Care assumed from Dr. Lita Mains.  CTs pending to evaluate abdominal pain, vomiting.  Urine culture with klebsiella only sensitive to imipenem.   CTs negative for acute pathology.   Patient nontoxic and nonseptic appearing. WBC and lactate normal. Afebrile. Concern for complicated UTI. Culture shows resistance to all PO antibiotic options.   Admission for IV antibiotics d/w Dr. Darrick Meigs.    Ezequiel Essex, MD 04/20/17 [id number]

## 2017-04-20 NOTE — Progress Notes (Signed)
ANTIBIOTIC CONSULT NOTE  Pharmacy Consult for PRIMAXIN (DAW) Indication: UTI, ESBL  No Known Allergies  Patient Measurements: Height: 5\' 11"  (180.3 cm) Weight: 164 lb (74.4 kg) IBW/kg (Calculated) : 75.3  Vital Signs: Temp: 98.7 F (37.1 C) (08/29 0814) Temp Source: Oral (08/29 0814) BP: 124/62 (08/29 0814) Pulse Rate: 86 (08/29 0814)  Labs:  Recent Labs  04/19/17 2137 04/20/17 0547  WBC 10.2 8.5  HGB 9.1* 9.1*  PLT 330 307  CREATININE 1.17 1.05   Estimated Creatinine Clearance: 72.8 mL/min (by C-G formula based on SCr of 1.05 mg/dL).  No results for input(s): VANCOTROUGH, VANCOPEAK, VANCORANDOM, GENTTROUGH, GENTPEAK, GENTRANDOM, TOBRATROUGH, TOBRAPEAK, TOBRARND, AMIKACINPEAK, AMIKACINTROU, AMIKACIN in the last 72 hours.   Microbiology: Recent Results (from the past 720 hour(s))  Urine culture     Status: Abnormal   Collection Time: 04/08/17  9:38 PM  Result Value Ref Range Status   Specimen Description URINE, CLEAN CATCH  Final   Special Requests NONE  Final   Culture (A)  Final    >=100,000 COLONIES/mL KLEBSIELLA PNEUMONIAE Confirmed Extended Spectrum Beta-Lactamase Producer (ESBL) Performed at [facility], [address] [place], [place] [address]    Report Status 04/12/2017 FINAL  Final   Organism ID, Bacteria KLEBSIELLA PNEUMONIAE (A)  Final      Susceptibility   Klebsiella pneumoniae - MIC*    AMPICILLIN >=32 RESISTANT Resistant     CEFAZOLIN >=64 RESISTANT Resistant     CEFTRIAXONE >=64 RESISTANT Resistant     CIPROFLOXACIN 2 INTERMEDIATE Intermediate     GENTAMICIN <=1 SENSITIVE Sensitive     IMIPENEM <=0.25 SENSITIVE Sensitive     NITROFURANTOIN 64 INTERMEDIATE Intermediate     TRIMETH/SULFA >=320 RESISTANT Resistant     AMPICILLIN/SULBACTAM >=32 RESISTANT Resistant     PIP/TAZO 32 INTERMEDIATE Intermediate     Extended ESBL POSITIVE Resistant     * >=100,000 COLONIES/mL KLEBSIELLA PNEUMONIAE  Culture, blood (Routine x 2)     Status: None  (Preliminary result)   Collection Time: 04/19/17  9:37 PM  Result Value Ref Range Status   Specimen Description BLOOD LEFT ARM  Final   Special Requests   Final    BOTTLES DRAWN AEROBIC AND ANAEROBIC Blood Culture adequate volume   Culture NO GROWTH < 12 HOURS  Final   Report Status PENDING  Incomplete  Culture, blood (Routine x 2)     Status: None (Preliminary result)   Collection Time: 04/19/17  9:45 PM  Result Value Ref Range Status   Specimen Description BLOOD LEFT ARM  Final   Special Requests   Final    BOTTLES DRAWN AEROBIC AND ANAEROBIC Blood Culture adequate volume   Culture NO GROWTH < 12 HOURS  Final   Report Status PENDING  Incomplete    Medical History: Past Medical History:  Diagnosis Date  . Alcohol abuse   . Alcoholic pancreatitis 6010   admission  . Chronic back pain   . Chronic pancreatitis ([place])    based on ct findings 2016  . COPD (chronic obstructive pulmonary disease) ([place])   . Diabetes mellitus   . Diverticulosis   . Gastritis   . GERD (gastroesophageal reflux disease)   . Headache   . Hyperlipidemia   . Hypertension   . Peptic ulcer disease 1999   Per medical reports, no H pylori  . Renal cancer, left ([place]) 2012   he tells me that he has been released, ?and that he is free of cancer, and never  had it to begin with.   . Right shoulder pain   . Testicular hypofunction    Medications:  Prescriptions Prior to Admission  Medication Sig Dispense Refill Last Dose  . Acetaminophen (TYLENOL 8 HOUR PO) 2 tablets by Feeding Tube route 2 (two) times daily as needed.    04/19/2017 at 1700  . albuterol (PROVENTIL HFA;VENTOLIN HFA) 108 (90 Base) MCG/ACT inhaler Inhale 1-2 puffs into the lungs every 6 (six) hours as needed for wheezing or shortness of breath. 1 Inhaler 0 unknown  . cetirizine (ZYRTEC) 10 MG tablet Place 10 mg into feeding tube at bedtime.   11 04/18/2017 at Unknown time  . fluconazole (DIFLUCAN) 100 MG tablet Place 1 tablet into feeding tube  daily. 7 day course starting on 04/15/2017   04/19/2017 at Unknown time  . glipiZIDE (GLUCOTROL) 5 MG tablet Place 5 mg into feeding tube daily as needed (for high blood sugar). PT said he takes it as needed ( if BS gets up to 200 he said)    unknown  . HYDROcodone-acetaminophen (NORCO) 7.5-325 MG tablet Take 1 tablet by mouth every 6 (six) hours as needed (pain). (Patient taking differently: 1 tablet every 6 (six) hours as needed (pain). Per feeding tube) 60 tablet 0 04/18/2017 at Unknown time  . LANTUS 100 UNIT/ML injection Inject 25 Units into the skin every evening.   1 04/18/2017 at Unknown time  . lisinopril (PRINIVIL,ZESTRIL) 5 MG tablet Place 5 mg into feeding tube every morning.   3 04/19/2017 at Unknown time  . LORazepam (ATIVAN) 1 MG tablet Take 1 mg by mouth 4 (four) times daily as needed for anxiety. For anxiety  5 04/19/2017 at Unknown time  . NOVOLOG 100 UNIT/ML injection Inject 5 Units into the skin every 4 (four) hours.   1 04/19/2017 at Unknown time  . pantoprazole (PROTONIX) 20 MG tablet 20 mg daily. Per feeding tube   04/18/2017 at Unknown time  . senna (SENOKOT) 8.6 MG TABS tablet Take 1 tablet (8.6 mg total) by mouth at bedtime as needed for mild constipation. May be needed as pain medication can constipate. (Patient taking differently: Place 1 tablet into feeding tube at bedtime as needed for mild constipation. May be needed as pain medication can constipate.) 120 each 1 04/19/2017 at Unknown time  . terazosin (HYTRIN) 1 MG capsule Place 1 capsule into feeding tube 2 (two) times daily.    04/19/2017 at Unknown time  . traZODone (DESYREL) 50 MG tablet Take 50 mg by mouth at bedtime.   04/18/2017 at Unknown time  . cephALEXin (KEFLEX) 500 MG capsule Take 1 capsule (500 mg total) by mouth 4 (four) times daily. (Patient not taking: Reported on 04/19/2017) 40 capsule 0 Not Taking at Unknown time   Scheduled:  . chlorhexidine  15 mL Mouth Rinse BID  . enoxaparin (LOVENOX) injection  40 mg  Subcutaneous Q24H  . feeding supplement (GLUCERNA SHAKE)  237 mL Oral Q6H  . fluconazole  100 mg Per Tube Daily  . free water  100 mL Per Tube Q8H  . insulin aspart  0-9 Units Subcutaneous Q6H  . insulin glargine  12 Units Subcutaneous QPM  . loratadine  10 mg Oral Daily  . mouth rinse  15 mL Mouth Rinse q12n4p  . pantoprazole sodium  40 mg Oral Daily  . [START ON 04/21/2017] pneumococcal 23 valent vaccine  0.5 mL Intramuscular Tomorrow-1000  . terazosin  1 mg Per Tube BID  . traZODone  50 mg Oral  QHS   Infusions:  . sodium chloride    . imipenem-cilastatin     PRN: albuterol, HYDROcodone-acetaminophen, LORazepam, ondansetron **OR** ondansetron (ZOFRAN) IV, senna Anti-infectives    Start     Dose/Rate Route Frequency Ordered Stop   04/20/17 1400  imipenem-cilastatin (PRIMAXIN) 500 mg in sodium chloride 0.9 % 100 mL IVPB     500 mg 200 mL/hr over 30 Minutes Intravenous Every 8 hours 04/20/17 0802     04/20/17 1000  fluconazole (DIFLUCAN) tablet 100 mg    Comments:  7 day course starting on 04/15/2017     100 mg Per Tube Daily 04/20/17 0318     04/20/17 0630  imipenem-cilastatin (PRIMAXIN) 500 mg in sodium chloride 0.9 % 100 mL IVPB     500 mg 200 mL/hr over 30 Minutes Intravenous  Once 04/20/17 0340 04/20/17 0709   04/19/17 2330  cefTRIAXone (ROCEPHIN) 1 g in dextrose 5 % 50 mL IVPB  Status:  Discontinued     1 g 100 mL/hr over 30 Minutes Intravenous  Once 04/19/17 2323 04/19/17 2324   04/19/17 2330  imipenem-cilastatin (PRIMAXIN) 500 mg in sodium chloride 0.9 % 100 mL IVPB     500 mg 200 mL/hr over 30 Minutes Intravenous  Once 04/19/17 2324 04/20/17 0056     Assessment: 66 yo male with ESBL klebsiella UTI sensitive to imipenem. Received 1 dose in ED, asked to continue.   Plan:  Primaxin 500mg  IV q8hrs Monitor labs, progress, c/s  Hart Robinsons A, RPH 04/20/2017,9:45 AM

## 2017-04-21 DIAGNOSIS — E440 Moderate protein-calorie malnutrition: Secondary | ICD-10-CM

## 2017-04-21 DIAGNOSIS — K861 Other chronic pancreatitis: Secondary | ICD-10-CM | POA: Diagnosis not present

## 2017-04-21 DIAGNOSIS — I10 Essential (primary) hypertension: Secondary | ICD-10-CM | POA: Diagnosis not present

## 2017-04-21 DIAGNOSIS — E871 Hypo-osmolality and hyponatremia: Secondary | ICD-10-CM | POA: Diagnosis not present

## 2017-04-21 DIAGNOSIS — C060 Malignant neoplasm of cheek mucosa: Secondary | ICD-10-CM | POA: Diagnosis not present

## 2017-04-21 DIAGNOSIS — B961 Klebsiella pneumoniae [K. pneumoniae] as the cause of diseases classified elsewhere: Secondary | ICD-10-CM | POA: Diagnosis not present

## 2017-04-21 DIAGNOSIS — N390 Urinary tract infection, site not specified: Secondary | ICD-10-CM | POA: Diagnosis not present

## 2017-04-21 LAB — GLUCOSE, CAPILLARY
GLUCOSE-CAPILLARY: 264 mg/dL — AB (ref 65–99)
GLUCOSE-CAPILLARY: 318 mg/dL — AB (ref 65–99)
GLUCOSE-CAPILLARY: 260 mg/dL — AB (ref 65–99)
GLUCOSE-CAPILLARY: 87 mg/dL (ref 65–99)
Glucose-Capillary: 315 mg/dL — ABNORMAL HIGH (ref 65–99)

## 2017-04-21 LAB — BASIC METABOLIC PANEL
ANION GAP: 8 (ref 5–15)
BUN: 31 mg/dL — AB (ref 6–20)
CHLORIDE: 102 mmol/L (ref 101–111)
CO2: 27 mmol/L (ref 22–32)
Calcium: 9.2 mg/dL (ref 8.9–10.3)
Creatinine, Ser: 1.07 mg/dL (ref 0.61–1.24)
GFR calc Af Amer: >60 mL/min (ref >60)
GLUCOSE: 195 mg/dL — AB (ref 65–99)
POTASSIUM: 4.9 mmol/L (ref 3.5–5.1)
Sodium: 137 mmol/L (ref 135–145)

## 2017-04-21 LAB — MAGNESIUM: MAGNESIUM: 2 mg/dL (ref 1.7–2.4)

## 2017-04-21 LAB — MRSA PCR SCREENING: MRSA BY PCR: NEGATIVE

## 2017-04-21 MED ORDER — INSULIN GLARGINE 100 UNIT/ML ~~LOC~~ SOLN
20.0000 [IU] | Freq: Every evening | SUBCUTANEOUS | Status: DC
  Administered 2017-04-21: 20 [IU] via SUBCUTANEOUS
  Filled 2017-04-21 (×4): qty 0.2

## 2017-04-21 MED ORDER — OSMOLITE 1.5 CAL PO LIQD
474.0000 mL | Freq: Three times a day (TID) | ORAL | Status: DC
  Filled 2017-04-21 (×4): qty 474

## 2017-04-21 MED ORDER — OSMOLITE 1.5 CAL PO LIQD
474.0000 mL | Freq: Three times a day (TID) | ORAL | Status: DC
  Administered 2017-04-21 – 2017-04-22 (×4): 474 mL
  Filled 2017-04-21 (×13): qty 1000

## 2017-04-21 MED ORDER — INSULIN ASPART 100 UNIT/ML ~~LOC~~ SOLN
0.0000 [IU] | SUBCUTANEOUS | Status: DC
  Administered 2017-04-21 – 2017-04-22 (×2): 5 [IU] via SUBCUTANEOUS
  Administered 2017-04-22: 2 [IU] via SUBCUTANEOUS

## 2017-04-21 MED ORDER — SODIUM CHLORIDE 0.9 % IV SOLN: INTRAVENOUS | Status: AC

## 2017-04-21 NOTE — Progress Notes (Signed)
Head and Neck Cancer Location of Tumor / Histology:  03/01/17 Surgery at [facility] revealed: LEFT MANDIBLE, MAXILLA, BUCCAL, PHARYNX, INTRATEMPORAL FOSSA, COMPOSITE RESECTION: Invasive squamous cell carcinoma, moderately differentiated. Tumor size:4.5 cm. Tumor invades mandibular bone. No definite perineural invasion. Margins negative for malignancy. Negative for p16 by immunohistochemistry. Pathologic stage:pT4a pN1.   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 Tumor deposit size:0.8 cm.  Patient presented  months ago with symptoms of: He presented to his regular dentist with Left jaw pain.   Biopsies revealed: 03/01/17 FINAL PATHOLOGIC DIAGNOSIS MICROSCOPIC EXAMINATION AND DIAGNOSIS  A.LEFT PAROTID DUCT, BIOPSY: Negative for malignancy.  B.INFRATEMPORAL FOSSA, BIOPSY: Negative for malignancy.  C.LYMPH NODES, LEFT LEVEL 1B, EXCISION: Eleven lymph nodes, negative for metastasis (0/11). Benign salivary gland tissue.  D.LYMPH NODES, LEVEL 1A, EXCISION: Four lymph nodes, negative for metastasis (0/4).  E.TEMPORALIS MUSCLE, BIOPSY: Negative for malignancy.  F.BUCCAL MUCOSA, BIOPSY: Negative for malignancy.  G.LEFT SOFT PALATE, BIOPSY: Negative for malignancy.  H.LEFT FLOOR OF MOUTH, BIOPSY: Negative for malignancy.  I.DEEP BUCCAL MUCOSA, BIOPSY: Negative for malignancy.  J.LEFT MANDIBLE, MAXILLA, BUCCAL, PHARYNX, INTRATEMPORAL FOSSA, COMPOSITE RESECTION: Invasive squamous cell carcinoma, moderately differentiated. Tumor size:4.5 cm. Tumor invades mandibular bone. No definite perineural invasion. Margins negative for malignancy. Negative for p16 by immunohistochemistry. Pathologic stage:pT4a pN1. See tumor  protocol.  K.MEDIAL PTERYGOID AND MAXILLARY TUBEROSITY, EXCISION: Negative for malignancy.  L.TEMPORALIS MUSCLE, EXCISION Negative for malignancy.  M.LATERAL PTERYGOID MUSCLE, EXCISION: Negative for malignancy.  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 Tumor deposit size:0.8 cm. No extranodal extension.  Nutrition Status Yes No Comments  Weight changes? [x]  []  18 pounds  Swallowing concerns? [x]  []  diffiuty with swallowing  PEG? [x]  []  Yes   Referrals Yes No Comments  Social Work? []  [x]    Dentistry? [x]  []  To see Dr. Enrique Sack went today   Swallowing therapy? []  [x]  [facility]  Nutrition? []  [x]  [facility]  Med/Onc? []  [x]  Will get it scheduled   Safety Issues Yes No Comments  Prior radiation? []  [x]    Pacemaker/ICD? []  [x]    Possible current pregnancy? []  [x]    Is the patient on methotrexate? []  [x]     Tobacco/Marijuana/Snuff/ETOH use: Patient stated that he has not drank  For 25 years. Quit smoking July 10th  Past/Anticipated interventions by otolaryngology, if any:  03/01/17 Procedure Laterality Date  . FREE FLAP SCAPULAR N/A 03/01/2017  Procedure: FREE FLAP SCAPULAR; Surgeon: Wyline Beady, MD; Location: [place] [facility]; Service: ENT; Laterality: N/A;  . NECK DISSECTION Bilateral 03/01/2017  Procedure: SELECTIVE NECK DISSECTION; Surgeon: Wyline Beady, MD; Location: [place] [facility]; Service: ENT; Laterality: Bilateral;  . TRACHEOSTOMY 03/01/2017  Procedure: TRACHEOTOMY; Surgeon: Wyline Beady, MD; Location: [place] [facility]; Service: ENT;;    Past/Anticipated interventions by medical oncology, if any:  None    Current Complaints / other details:   Vitals:   04/27/17 1324 04/27/17 1350  BP: 96/60 (!) 103/58  Pulse: 97 (!) 105  Resp: 18   Temp: (!) 97.4 F (36.3 C)   TempSrc: Oral   SpO2: 99%   Weight: 160 lb 4 oz (72.7 kg)     Wt Readings from Last 3 Encounters:  04/27/17 160 lb 4 oz (72.7 kg)  04/20/17 164 lb (74.4 kg)  04/08/17 156 lb (70.8 kg)

## 2017-04-21 NOTE — Care Management Note (Signed)
Case Management Note  Patient Details  Name: Colin Wagner MRN: [id number] Date of Birth: May 26, 1951  Subjective/Objective:                  Admitted with sepsis. From home with gf. Pt ind with ambulation. Pt has home oxygen that he is planning to return in the near future. He has suction. He has RW if he needs it. Pt communicates no DME needs at DC. Pt active with [facility] for RN and PT. Pt wishes to cont those services, will also need IV abx for 1 week.   Action/Plan: [facility] rep, aware of DC plan. Will pull orders from chart. Plan for DC home tomorrow with IV abx and resumption of RN and PT.   Expected Discharge Date:    04/22/2017              Expected Discharge Plan:  [place]  In-House Referral:  NA  Discharge planning Services  CM Consult  Post Acute Care Choice:  Home Health, Resumption of Svcs/PTA Provider Choice offered to:  Patient  HH Arranged:  RN, PT, IV Antibiotics HH Agency:  Home  Status of Service:  In process, will continue to follow Sherald Barge, RN 04/21/2017, 11:00 AM

## 2017-04-21 NOTE — Progress Notes (Signed)
Inpatient Diabetes Program Recommendations  AACE/ADA: New Consensus Statement on Inpatient Glycemic Control (2015)  Target Ranges:  Prepandial:   less than 140 mg/dL      Peak postprandial:   less than 180 mg/dL (1-2 hours)      Critically ill patients:  140 - 180 mg/dL   Results for Colin Wagner, Colin Wagner (MRN [id number]) as of 04/21/2017 08:26  Ref. Range 04/20/2017 05:55 04/20/2017 11:13 04/20/2017 16:34 04/20/2017 21:24 04/21/2017 00:08 04/21/2017 05:49  Glucose-Capillary Latest Ref Range: 65 - 99 mg/dL 150 (H) 174 (H) 90 241 (H) 315 (H) 264 (H)    Review of Glycemic Control  Diabetes history: DM2 Outpatient Diabetes medications: Lantus 25 units QPM, Novolog 5 units Q4H, Glipizide 5 mg daily (when needed if CBG > 200 mg/dl) Current orders for Inpatient glycemic control: Lantus 12 units QPM, Novolog 0-9 units Q6H  Inpatient Diabetes Program Recommendations: Insulin - Basal: Please consider increasing Lantus 20 units QPM (approximately based on 74 kg x 0.25 units). Insulin-Correction: Please consider changing frequency of CBGs and Novolog to Q4H.  Thanks, Barnie Alderman, RN, MSN, CDE Diabetes Coordinator Inpatient Diabetes Program [phone] (Team Pager from 8am to 5pm)

## 2017-04-21 NOTE — Plan of Care (Signed)
Problem: Pain Managment: Goal: General experience of comfort will improve Outcome: Progressing Pt c/o 8/10 pain in L cheek from hx of CA removal. Pt given pain medications PRN. Pt educated on pain mgmt and medications available. Pt verbalized understanding. Will continue to monitor pt

## 2017-04-21 NOTE — Progress Notes (Signed)
Nutrition follow-up  INTERVENTION:  -Osmolite 1.5 bolus feedings advanced- 474 ml TID. At goal tube feedings provide: 2133 kcal, 89 gr protein, 1086 water from formula.  -Pureed diet with thin liquids. Pt eating 50-75% of meal last night and this morning. He has left side anterior spillage and had and has lost a  significant amount of his oatmeal due to this while RD was present. Unsure of how well he can sustain oral intake to meet nutrition needs. His enteral regimen is calculated to provide at least 90% of needs. Recommend he continue bolus feedings as noted above. Defer to [place] team to adjust further as indicated. He has lost a significant amount of weight and therefore gradual wt gain would be desirable for repletion.  -Free water flushes before and after meds and stopping or administering bolus feedings per protocol.- IV fluids @ 75 ml/hr and 100 ml free water q 8 hrs.  -Pt will need 100 ml q 8 hr free water. He is able to drink thin liquids and if he consumes at least 3 cups orally in addition to enteral feeding he will meet est fluid needs.   NUTRITION DIAGNOSIS:   Malnutrition (non severe) related to catabolic illness (throat cancer) as evidenced by moderate depletions of muscle mass, mild depletion of body fat, mild depletion of muscle mass.  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 He is to follow up with [facility] next week according to his significant other. He presents with c/o vomiting and found to have UTI.   8/30: the patient is tolerating his tube feeding and his diet was advanced last night  pureed with thin liquids. He is eating 50-75% per documentation. He was feeding himself oatmeal this morning but is losing much of it out of the left side of his mouth. Unsure how much of the oatmeal he actually was able to consume. Expect that eating is more for pleasure.   We talked about the timing of his tube feeds and the volume. He denies any issues with feeling too full. Will advance to 474 ml boluses today. His blood glucose it trending up - DM coordinator note reviewed.      Recent Labs Lab 04/19/17 2137 04/20/17 0547 04/21/17 0638  NA 132* 132* 137  K 4.8 5.0 4.9  CL 95* 97* 102  CO2 28 25 27   BUN 39* 30* 31*  CREATININE 1.17 1.05 1.07  CALCIUM 9.6 9.5 9.2  MG  --   --  2.0  GLUCOSE 173* 138* 195*   Labs: sodium 137 and glu 195   Meds: SSI, PPI   Diet Order:  DIET - DYS 1 Room service appropriate? Yes; Fluid consistency: Thin  Skin:  Left flank, surgical site. healing stage I  Last BM:  unknown  Height:   Ht Readings from Last 1 Encounters:  04/20/17 5\' 11"  (1.803 m)    Weight:   Wt Readings from Last 1 Encounters:  04/20/17 164 lb (74.4 kg)    Ideal Body Weight:  78 kg  BMI:  Body mass index is 22.87 kg/m.  Estimated Nutritional Needs:  Kcal:  2220-2442 (30-33 kcal/kg)  Protein:  89-111 gr (1.2-1.5 gr/kg)  Fluid:  2.2 liters daily  EDUCATION NEEDS:  Addressed related to the importance of providing total nutrition support prescription daily    Colman Cater MS,RD,CSG,LDN Office: [phone] Pager: [phone]

## 2017-04-21 NOTE — Evaluation (Signed)
Clinical/Bedside Swallow Evaluation Patient Details  Name: Colin Wagner MRN: [id number] Date of Birth: [birth year]  Today's Date: 04/21/2017 Time: SLP Start Time (ACUTE ONLY): 4010 SLP Stop Time (ACUTE ONLY): 1558 SLP Time Calculation (min) (ACUTE ONLY): 28 min  Past Medical History:  Past Medical History:  Diagnosis Date  . Alcohol abuse   . Alcoholic pancreatitis 2725   admission  . Chronic back pain   . Chronic pancreatitis ([place])    based on ct findings 2016  . COPD (chronic obstructive pulmonary disease) ([place])   . Diabetes mellitus   . Diverticulosis   . Gastritis   . GERD (gastroesophageal reflux disease)   . Headache   . Hyperlipidemia   . Hypertension   . Peptic ulcer disease 1999   Per medical reports, no H pylori  . Renal cancer, left ([place]) 2012   he tells me that he has been released, ?and that he is free of cancer, and never had it to begin with.   . Right shoulder pain   . Testicular hypofunction    Past Surgical History:  Past Surgical History:  Procedure Laterality Date  . COLONOSCOPY  2003   Dr. [name], polyps  . COLONOSCOPY  2005   Dr. [name], multiple diverticula  . COLONOSCOPY  2008   Dr. [name], diverticulosis  . COLONOSCOPY WITH PROPOFOL N/A 10/21/2016   Procedure: COLONOSCOPY WITH PROPOFOL;  Surgeon: [name], MD;  [facility];  Service: Endoscopy;  Laterality: N/A;  8:30 am  . ESOPHAGOGASTRODUODENOSCOPY     Multiple EGDs. 1999 EGD showed gastric ulcers, no H pylori and benign biopsies performed by Dr. [name]. 2001 gastric ulcer healed. Last EGD 2005 had gastritis.  Marland Kitchen LUNG BIOPSY    . PARTIAL NEPHRECTOMY Left 2012  . 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 The patient has had problems with urinary retention. He had a Foley catheter placed in the emergency department on 04/08/2017. The patient was sent home with cephalexin at that time. He followed up with [facility] on 04/14/2017. His cephalexin was stopped at that time. He followed up with his primary care provider, Dr. Karie Kirks, on 04/15/17 who placed the patient on fluconazole for thrush. The patient was doing relatively well until he began having nausea and vomiting on 04/19/2017 with associated subjective fevers and chills.  Upon presentation, the patient was found to have WBC 10.2. He remains afebrile and hemodynamically stable. 04/08/2017-urine culture grows ESBL Klebsiella pneumoniae.   Assessment / Plan / Recommendation Clinical Impression  Pt presents with moderate oral phase dysphagia characterized by reduced labial seal and reduced jaw opening resulting in labial spillage, poor oral containment, poor anterior-posterior bolus transit, and left buccal residuals post swallow. Pt had FEES at [facility] in July with recommendation for puree and thin liquids by mouth as secondary mean of nutrition with continued use of PEG for primary. Pt will likely begin chemo/radiation therapy in September per Pt. He will need ongoing SLP follow up for dysphagia and possible trismus therapy during his treatment. Pt already receiving OT services through [facility], recommend SLP as well (can also be seen as an outpatient if desired). Recommend continued primary means of nutrition  via PEG and puree/thin by mouth as tolerated. Pt in agreement with plan of care. SLP provided education regarding importance of oral hygiene, aspiration and reflux precautions, and signs of aspiration. SLP will sign off for acute stay.  SLP Visit Diagnosis: Dysphagia, oropharyngeal phase (R13.12)    Aspiration Risk  Mild aspiration risk;Risk for inadequate nutrition/hydration (Pt has PEG)     Diet Recommendation Dysphagia 1 (Puree);Thin liquid;Alternative means - long-term   Liquid Administration via: Cup Medication Administration: Via alternative means Supervision: Patient able to self feed;Intermittent supervision to cue for compensatory strategies Compensations: Slow rate;Small sips/bites;Lingual sweep for clearance of pocketing (oral care post po consumption) Postural Changes: Seated upright at 90 degrees;Remain upright for at least 30 minutes after po intake    Other  Recommendations Oral Care Recommendations: Oral care before and after PO Other Recommendations: Have oral suction available;Clarify dietary restrictions   Follow up Recommendations Home health SLP      Frequency and Duration            Prognosis Prognosis for Safe Diet Advancement: Good Barriers to Reach Goals: Severity of deficits      Swallow Study   General Date of Onset: 04/19/17 HPI: 66 year old male with a history of diabetes mellitus, essential hypertension, hyperlipidemia, and 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 The patient has had problems with urinary retention. He had a Foley catheter placed in the emergency department on 04/08/2017. The patient was sent home with cephalexin at that time. He followed up with [facility] on 04/14/2017. His cephalexin was stopped at that time. He followed up with his primary care provider, Dr. Karie Kirks, on 04/15/17 who placed the patient on fluconazole for thrush. The patient was doing relatively well until he began having nausea and vomiting on 04/19/2017 with associated subjective fevers and chills.  Upon presentation, the patient was found to have WBC 10.2. He remains afebrile and hemodynamically stable. 04/08/2017-urine culture grows ESBL Klebsiella pneumoniae. Type of Study: Bedside Swallow Evaluation Previous Swallow Assessment: FEES at [facility] in July with rec for puree/thin Diet Prior  to this Study: Thin liquids;Dysphagia 1 (puree) Temperature Spikes Noted: No Respiratory Status: Room air History of Recent Intubation: No Behavior/Cognition: Alert;Cooperative;Pleasant mood Oral Cavity Assessment: Edema Oral Care Completed by SLP: Yes Oral Cavity - Dentition: Poor condition Vision: Functional for self-feeding Self-Feeding Abilities: Able to feed self Patient Positioning: Upright in bed Baseline Vocal Quality: Normal;Low vocal intensity Volitional Cough: Strong Volitional Swallow: Able to elicit    Oral/Motor/Sensory Function Overall Oral Motor/Sensory Function: Moderate impairment Facial ROM: Reduced left Facial Symmetry: Abnormal symmetry left Facial Strength: Reduced left Facial Sensation: Reduced left Lingual ROM: Within Functional Limits Lingual Symmetry: Within Functional Limits Lingual Strength: Within Functional Limits Lingual Sensation: Within Functional Limits Mandible: Impaired   Ice Chips Ice chips: Within functional limits Presentation: Spoon   Thin Liquid Thin Liquid: Impaired Presentation: Cup;Self Fed Oral Phase Impairments: Reduced labial seal Oral Phase Functional Implications: Left anterior spillage    Nectar Thick Nectar Thick Liquid: Not tested   Honey Thick Honey Thick Liquid: Not tested   Puree Puree: Impaired Presentation: Self Fed;Spoon Oral Phase Impairments: Reduced labial seal Oral Phase Functional Implications: Left anterior spillage;Left lateral sulci pocketing;Prolonged oral transit;Oral residue   Solid   Thank you,  Genene Churn, CCC-SLP [phone]    Solid: Not tested        Pia Jedlicka 04/21/2017,4:31 PM

## 2017-04-21 NOTE — Progress Notes (Signed)
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 The patient has had problems with urinary retention. He had a Foley catheter placed in the emergency department on 04/08/2017. The patient was sent home with cephalexin at that time. He followed up with [facility] on 04/14/2017. His cephalexin was stopped at that time. He followed up with his primary care provider, Dr. Karie Kirks, on 04/15/17 who placed the patient on fluconazole for thrush. The patient was doing relatively well until he began having nausea and vomiting on 04/19/2017 with associated subjective fevers and chills.  Upon presentation, the patient was found to have WBC 10.2. He remains afebrile and hemodynamically stable. 04/08/2017-urine culture grows ESBL Klebsiella pneumoniae.  Assessment/Plan: ESBL Klebsiella pneumoniae UTI -This culture was obtained by clean catch sample prior to insertion of his Foley catheter on 04/08/2017 -continue imipenem--continue through 04/27/17 -Placed PICC line 8/29 -Follow up repeat urine culture--Kleb pneumo -blood cultures remain negative  Diabetes mellitus type 2 -check A1C--6.3 -increase lantus to 20 units -novolog sliding scale -holding glipizide  Nutrition -consulted nutrition to see if his home enteral feeding schedule can be adjusted to allow him better sleep -continue glucerna 1.5 equivalent-->2 cans every 8 hours--discussed with nutrition  Essential Hypertension -holding lisinopril due to rising potassium -continue terazosin  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 Vertell Limber, at [facility] next week  Left flank wound -it is the site of where  muscle flap was obtained -continue wet to dry bid -no signs of infection  Thrush -finished fluconazole on 8/30  Moderate malnutrition -continue enteral feedings   Disposition Plan:   Home in 1-2 days  Family Communication:   Girlfriend updated at bedside--Total time spent 35 minutes.  Greater than 50% spent face to face counseling and coordinating care. 0905 to [place]:  none  Code Status:  FULL   DVT Prophylaxis: [place] Lovenox   Procedures: As Listed in Progress Note Above  Antibiotics: Imipenem 8/29>>>   Subjective: Patient denies fevers, chills, headache, chest pain, dyspnea, nausea, vomiting, diarrhea, abdominal pain, dysuria, hematuria, hematochezia, and melena.   Objective: Vitals:   04/20/17 1316 04/20/17 2127 04/21/17 0554 04/21/17 1425  BP: (!) 103/52 104/61 (!) 100/52 108/61  Pulse: 81 78 79 81  Resp: 20 20 14 20   Temp: 98.4 F (36.9 C) 98.9 F (37.2 C) 98.5 F (36.9 C) 98.6 F (37 C)  TempSrc: Oral Oral Oral Oral  SpO2: 97% 100% 98% 97%  Weight:      Height:        Intake/Output Summary (Last 24 hours) at 04/21/17 1830 Last data filed at 04/21/17 1300  Gross per 24 hour  Intake          3106.25 ml  Output             1150 ml  Net          1956.25 ml   Weight change: 1.361 kg (3 lb) Exam:   General:  Pt is alert, follows commands appropriately, not in acute distress  HEENT: No icterus, No thrush, No neck mass, [place]/AT  Cardiovascular: RRR, S1/S2, no rubs, no gallops  Respiratory: CTA bilaterally, no wheezing, no crackles, no  rhonchi  Abdomen: Soft/+BS, non tender, non distended, no guarding  Extremities: No edema, No lymphangitis, No petechiae, No rashes, no synovitis   Data Reviewed: I have personally reviewed following labs and imaging studies Basic Metabolic Panel:  Recent Labs Lab 04/19/17 2137 04/20/17 0547 04/21/17 0638  NA 132* 132* 137  K 4.8 5.0 4.9  CL 95* 97* 102  CO2 28 25 27   GLUCOSE 173* 138*  195*  BUN 39* 30* 31*  CREATININE 1.17 1.05 1.07  CALCIUM 9.6 9.5 9.2  MG  --   --  2.0   Liver Function Tests:  Recent Labs Lab 04/19/17 2137 04/20/17 0547  AST 17 17  ALT 23 24  ALKPHOS 95 88  BILITOT 0.2* 0.4  PROT 7.7 7.3  ALBUMIN 3.4* 3.2*    Recent Labs Lab 04/19/17 2137  LIPASE 24   No results for input(s): AMMONIA in the last 168 hours. Coagulation Profile:  Recent Labs Lab 04/19/17 2137  INR 1.08   CBC:  Recent Labs Lab 04/19/17 2137 04/20/17 0547  WBC 10.2 8.5  NEUTROABS 8.3*  --   HGB 9.1* 9.1*  HCT 28.1* 27.8*  MCV 79.2 79.0  PLT 330 307   Cardiac Enzymes: No results for input(s): CKTOTAL, CKMB, CKMBINDEX, TROPONINI in the last 168 hours. BNP: Invalid input(s): POCBNP CBG:  Recent Labs Lab 04/20/17 2124 04/21/17 0008 04/21/17 0549 04/21/17 1138 04/21/17 1635  GLUCAP 241* 315* 264* 318* 260*   HbA1C:  Recent Labs  04/19/17 2137  HGBA1C 6.3*   Urine analysis:    Component Value Date/Time   COLORURINE YELLOW 04/19/2017 2051   APPEARANCEUR HAZY (A) 04/19/2017 2051   LABSPEC 1.012 04/19/2017 2051   PHURINE 8.0 04/19/2017 2051   GLUCOSEU NEGATIVE 04/19/2017 2051   HGBUR NEGATIVE 04/19/2017 2051   [place] NEGATIVE 04/19/2017 2051   KETONESUR NEGATIVE 04/19/2017 2051   PROTEINUR NEGATIVE 04/19/2017 2051   UROBILINOGEN 0.2 04/27/2015 1916   NITRITE NEGATIVE 04/19/2017 2051   LEUKOCYTESUR LARGE (A) 04/19/2017 2051   Sepsis Labs: @LABRCNTIP (procalcitonin:4,lacticidven:4) ) Recent Results (from the past 240 hour(s))  Urine culture     Status: Abnormal (Preliminary result)   Collection Time: 04/19/17  8:51 PM  Result Value Ref Range Status   Specimen Description URINE, RANDOM  Final   Special Requests NONE  Final   Culture (A)  Final    >=100,000 COLONIES/mL KLEBSIELLA PNEUMONIAE SUSCEPTIBILITIES TO FOLLOW Performed at [facility], [place] [address], [place], [place] [address]    Report Status PENDING  Incomplete   Culture, blood (Routine x 2)     Status: None (Preliminary result)   Collection Time: 04/19/17  9:37 PM  Result Value Ref Range Status   Specimen Description BLOOD LEFT ARM  Final   Special Requests   Final    BOTTLES DRAWN AEROBIC AND ANAEROBIC Blood Culture adequate volume   Culture NO GROWTH 2 DAYS  Final   Report Status PENDING  Incomplete  Culture, blood (Routine x 2)     Status: None (Preliminary result)   Collection Time: 04/19/17  9:45 PM  Result Value Ref Range Status   Specimen Description BLOOD LEFT ARM  Final   Special Requests   Final    BOTTLES DRAWN AEROBIC AND ANAEROBIC Blood Culture adequate volume   Culture NO GROWTH 2 DAYS  Final   Report Status PENDING  Incomplete  MRSA PCR Screening     Status: None   Collection Time: 04/21/17 12:15 AM  Result Value  Ref Range Status   MRSA by PCR NEGATIVE NEGATIVE Final    Comment:        The GeneXpert MRSA Assay (FDA approved for NASAL specimens only), is one component of a comprehensive MRSA colonization surveillance program. It is not intended to diagnose MRSA infection nor to guide or monitor treatment for MRSA infections.      Scheduled Meds: . chlorhexidine  15 mL Mouth Rinse BID  . enoxaparin (LOVENOX) injection  40 mg Subcutaneous Q24H  . feeding supplement (OSMOLITE 1.5 CAL)  474 mL Per Tube TID  . fluconazole  100 mg Per Tube Daily  . free water  100 mL Per Tube Q8H  . insulin aspart  0-9 Units Subcutaneous Q4H  . insulin glargine  20 Units Subcutaneous QPM  . loratadine  10 mg Oral Daily  . mouth rinse  15 mL Mouth Rinse q12n4p  . pantoprazole sodium  40 mg Oral Daily  . terazosin  1 mg Per Tube BID  . traZODone  50 mg Oral QHS   Continuous Infusions: . imipenem-cilastatin Stopped (04/21/17 1249)    Procedures/Studies: Ct Soft Tissue Neck W Contrast  Result Date: 04/20/2017 CLINICAL DATA:  Fever. Jaw swelling. History of head and neck cancer. EXAM: CT NECK WITH CONTRAST TECHNIQUE: Multidetector  CT imaging of the neck was performed using the standard protocol following the bolus administration of intravenous contrast. CONTRAST:  166mL ISOVUE-300 IOPAMIDOL (ISOVUE-300) INJECTION 61% COMPARISON:  CT neck March 19, 2017 FINDINGS: PHARYNX AND LARYNX: Postsurgical changes for section of LEFT retromolar trigone mass with flap. Narrowed though patent LEFT internal jugular vein. Granulation tissue LEFT neck contiguous with the LEFT sternocleidomastoid muscle. Status post LEFT neck dissection. No focal fluid collection, or acute inflammatory changes. Patent airway. SALIVARY GLANDS: Resected LEFT submandibular gland. Mildly hypertrophic LEFT sublingual gland. Surgical clips LEFT parotid gland with possible partial LEFT parotidectomy. THYROID: Normal. LYMPH NODES: No lymphadenopathy by CT size criteria. VASCULAR: Mild calcific atherosclerosis aortic arch and carotid bifurcations. Approximate 50% stenosis RIGHT internal carotid artery on this non angiographic phase. LIMITED INTRACRANIAL: Normal. VISUALIZED ORBITS: Normal. MASTOIDS AND VISUALIZED PARANASAL SINUSES: Resected LEFT posterior maxillary antrum, well-aerated surgical bed extending into LEFT infratemporal fossa. Surgical clip LEFT pterygopalatine fossa. Paranasal sinus are well aerated. Small LEFT mastoid effusion without air cell coalescence. SKELETON: LEFT mandible plate and screw fixation with reconstruction, intact well-seated hardware. Slight offset of the posterior fusion at the angle of the mandible. Nondisplaced RIGHT nasal bone fracture. UPPER CHEST: Lung apices are clear. No superior mediastinal lymphadenopathy. OTHER: None. IMPRESSION: 1. No acute process in the neck. 2. Status post LEFT partial maxillectomy and reconstructive changes of LEFT mandible for head and neck tumor. LEFT upper neck flap and LEFT modified radical neck dissection. No CT findings of residual or recurrent tumor though, MRI would be more sensitive in the setting of postoperative  change. 3. Atherosclerosis resulting in suspected 50% stenosis RIGHT internal carotid artery on this non angiographic phase. Electronically Signed   By: Elon Alas M.D.   On: 04/20/2017 00:25   Ct Abdomen Pelvis W Contrast  Result Date: 04/20/2017 CLINICAL DATA:  Abdominal pain, onset today.  Fevers. EXAM: CT ABDOMEN AND PELVIS WITH CONTRAST TECHNIQUE: Multidetector CT imaging of the abdomen and pelvis was performed using the standard protocol following bolus administration of intravenous contrast. CONTRAST:  181mL ISOVUE-300 IOPAMIDOL (ISOVUE-300) INJECTION 61% COMPARISON:  01/12/2017, 05/28/2015. FINDINGS: Lower chest: No acute abnormality. Hepatobiliary: No focal liver abnormality is seen. No gallstones, gallbladder wall thickening,  or biliary dilatation. Pancreas: Pancreatic calcifications and mild atrophy consistent with chronic pancreatitis. No evidence of acute pancreatitis. No pancreatic parenchymal mass or cyst. Spleen: Normal in size without focal abnormality. Adrenals/Urinary Tract: Adrenal glands are unremarkable. Kidneys are normal, without renal calculi, significant focal lesion, or hydronephrosis. Bladder is remarkable only for Foley catheter. Stomach/Bowel: Percutaneous gastrostomy appears satisfactorily positioned. Stomach and small bowel are otherwise unremarkable. Normal appendix. Colonic diverticulosis without evidence of acute diverticulitis. Vascular/Lymphatic: The abdominal aorta is normal in caliber with moderate atherosclerotic calcification. No adenopathy is evident in the abdomen or pelvis. Reproductive: Unremarkable Other: No focal inflammation.  No ascites. Musculoskeletal: No significant skeletal lesion. IMPRESSION: 1. No acute findings are evident in the abdomen or pelvis. 2. Chronic pancreatitis. 3. Colonic diverticulosis. Electronically Signed   By: Andreas Newport M.D.   On: 04/20/2017 00:56    Gregg Holster, DO  [facility] Pager [phone]  If 7PM-7AM,  please contact night-coverage [email or web address] Password [email or web address] 04/21/2017, 6:30 PM   LOS: 1 day

## 2017-04-22 DIAGNOSIS — K861 Other chronic pancreatitis: Secondary | ICD-10-CM | POA: Diagnosis not present

## 2017-04-22 DIAGNOSIS — I10 Essential (primary) hypertension: Secondary | ICD-10-CM | POA: Diagnosis not present

## 2017-04-22 DIAGNOSIS — N390 Urinary tract infection, site not specified: Secondary | ICD-10-CM | POA: Diagnosis present

## 2017-04-22 DIAGNOSIS — B961 Klebsiella pneumoniae [K. pneumoniae] as the cause of diseases classified elsewhere: Secondary | ICD-10-CM | POA: Diagnosis not present

## 2017-04-22 DIAGNOSIS — E440 Moderate protein-calorie malnutrition: Secondary | ICD-10-CM | POA: Diagnosis not present

## 2017-04-22 DIAGNOSIS — C411 Malignant neoplasm of mandible: Secondary | ICD-10-CM | POA: Diagnosis not present

## 2017-04-22 DIAGNOSIS — E871 Hypo-osmolality and hyponatremia: Secondary | ICD-10-CM | POA: Diagnosis not present

## 2017-04-22 DIAGNOSIS — C060 Malignant neoplasm of cheek mucosa: Secondary | ICD-10-CM | POA: Diagnosis not present

## 2017-04-22 DIAGNOSIS — Z23 Encounter for immunization: Secondary | ICD-10-CM | POA: Diagnosis not present

## 2017-04-22 LAB — BASIC METABOLIC PANEL
Anion gap: 6 (ref 5–15)
BUN: 28 mg/dL — AB (ref 6–20)
CALCIUM: 9.6 mg/dL (ref 8.9–10.3)
CHLORIDE: 106 mmol/L (ref 101–111)
CO2: 28 mmol/L (ref 22–32)
CREATININE: 0.96 mg/dL (ref 0.61–1.24)
GFR calc non Af Amer: >60 mL/min (ref >60)
Glucose, Bld: 47 mg/dL — ABNORMAL LOW (ref 65–99)
Potassium: 3.9 mmol/L (ref 3.5–5.1)
SODIUM: 140 mmol/L (ref 135–145)

## 2017-04-22 LAB — GLUCOSE, CAPILLARY
GLUCOSE-CAPILLARY: 151 mg/dL — AB (ref 65–99)
GLUCOSE-CAPILLARY: 268 mg/dL — AB (ref 65–99)
Glucose-Capillary: 161 mg/dL — ABNORMAL HIGH (ref 65–99)
Glucose-Capillary: 58 mg/dL — ABNORMAL LOW (ref 65–99)

## 2017-04-22 LAB — URINE CULTURE: Culture: >=100000 — AB

## 2017-04-22 MED ORDER — IMIPENEM-CILASTATIN IV (FOR PTA / DISCHARGE USE ONLY): 500.0000 mg | Freq: Three times a day (TID) | INTRAVENOUS | 0 refills | Status: DC

## 2017-04-22 MED ORDER — INSULIN ASPART 100 UNIT/ML ~~LOC~~ SOLN: 0.0000 [IU] | SUBCUTANEOUS | 0 refills | Status: DC

## 2017-04-22 MED ORDER — FREE WATER: 100.0000 mL | Freq: Three times a day (TID) | 0 refills | Status: DC

## 2017-04-22 NOTE — Care Management Important Message (Signed)
Important Message  Patient Details  Name: Colin Wagner MRN: [id number] Date of Birth: [birth year]   Medicare Important Message Given:  Yes    Sherald Barge, RN 04/22/2017, 10:38 AM

## 2017-04-22 NOTE — Discharge Summary (Addendum)
Physician Discharge Summary  Colin Wagner QQP:[id number] DOB: [birth year] DOA: 04/19/2017  PCP: [name], MD  Admit date: 04/19/2017 Discharge date: 04/22/2017  Admitted From: Home Disposition:  Home   Recommendations for Outpatient Follow-up:  1. Follow up with PCP in 1-2 weeks 2. Continue imipenem 500 mg IV Q8 hours through 04/26/17 3. 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 The patient has had problems with urinary retention. He had a Foley catheter placed in the emergency department on 04/08/2017. The patient was sent home with cephalexin at that time. He followed up with [facility] on 04/14/2017. His cephalexin was stopped at that time. He followed up with his primary care provider, Dr. Karie Kirks, on 8/24/18who placed the patient on fluconazole for thrush. The patient was doing relatively well until he began having nausea and vomiting on 04/19/2017 with associated subjective fevers and chills. Upon presentation, the patient was found to have WBC 10.2. He remains afebrile and hemodynamically stable. 04/08/2017-urineculture grows ESBL Klebsiella pneumoniae.  Discharge Diagnoses:  ESBL Klebsiella pneumoniae UTI -This culture was obtained by clean catch sample prior to insertion of his Foley catheter on 04/08/2017 -continue imipenem--continue through 04/26/17 which would complete 7 days of therapy -Placed PICC line 8/29 -remove PICC line on 04/27/17 -Follow up repeat urine culture--ESBL Kleb pneumo -blood cultures remain negative  Diabetes mellitus type 2 -check A1C--6.3 -increased lantus to 20  units -novolog sliding scale -holding glipizide  Nutrition -consulted nutrition to see if his home enteral feeding schedule can be adjusted to allow him better sleep -continue glucerna 1.5 equivalent-->2 cans every 8 hours--discussed with nutrition  Essential Hypertension -holding lisinopril due to rising potassium -continue terazosin  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 Vertell Limber, at [facility] 04/27/17  Left flank wound -it is the site of where muscle flap was obtained -continue wet to dry bid -no signs of infection  Thrush -finished fluconazole on 8/30  Moderate malnutrition -continue enteral feedings  Hyponatremia -due to volume depletion and poor solute intake -improved with IVF   Discharge Instructions  Discharge Instructions    Home infusion instructions [place] May follow [place] Dosing Protocol; May administer Cathflo as needed to maintain patency of vascular access device.; Flushing of vascular access device: per [facility] Protocol: 0.9% NaCl pre/post medica...    Complete by:  As directed    Instructions:  May follow [place] Dosing Protocol   Instructions:  May administer Cathflo as needed to maintain patency of vascular access device.   Instructions:  Flushing of vascular access device: per [facility] Protocol: 0.9% NaCl pre/post medication administration and prn patency; Heparin 100 u/ml, 49ml for implanted ports and Heparin 10u/ml, 23ml for all other central venous catheters.   Instructions:  May follow [facility] Anaphylaxis Protocol for First Dose Administration in the home: 0.9% NaCl at 25-50 ml/hr to maintain IV access for protocol meds. Epinephrine 0.3 ml IV/IM PRN and Benadryl 25-50 IV/IM PRN s/s of anaphylaxis.   Instructions:  [place] Infusion Coordinator (RN) to assist per patient IV care needs in the home PRN.     Allergies as of 04/22/2017   No Known Allergies  Medication  List    STOP taking these medications   cephALEXin 500 MG capsule Commonly known as:  KEFLEX   fluconazole 100 MG tablet Commonly known as:  DIFLUCAN   lisinopril 5 MG tablet Commonly known as:  PRINIVIL,ZESTRIL     TAKE these medications   albuterol 108 (90 Base) MCG/ACT inhaler Commonly known as:  PROVENTIL HFA;VENTOLIN HFA Inhale 1-2 puffs into the lungs every 6 (six) hours as needed for wheezing or shortness of breath.   cetirizine 10 MG tablet Commonly known as:  ZYRTEC Place 10 mg into feeding tube at bedtime.   free water Soln Place 100 mLs into feeding tube every 8 (eight) hours.   glipiZIDE 5 MG tablet Commonly known as:  GLUCOTROL Place 5 mg into feeding tube daily as needed (for high blood sugar). PT said he takes it as needed ( if BS gets up to 200 he said)   HYDROcodone-acetaminophen 7.5-325 MG tablet Commonly known as:  NORCO Take 1 tablet by mouth every 6 (six) hours as needed (pain). What changed:  how to take this  additional instructions   imipenem-cilastatin IVPB Commonly known as:  PRIMAXIN Inject 500 mg into the vein every 8 (eight) hours. Indication:  MDR UTI Last Day of Therapy:  04/26/17   insulin aspart 100 UNIT/ML injection Commonly known as:  novoLOG Inject 0-9 Units into the skin every 4 (four) hours. 70-120--No units; 121 to 150--1 unit; 151 to 200--2 units; 201 to 250--3 units; 251 to 300--5 units; 301 to 350--7 units; 351 to 400--9 units; >400--call MD What changed:  how much to take  additional instructions   LANTUS 100 UNIT/ML injection Generic drug:  insulin glargine Inject 25 Units into the skin every evening.   LORazepam 1 MG tablet Commonly known as:  ATIVAN Take 1 mg by mouth 4 (four) times daily as needed for anxiety. For anxiety   PROTONIX 20 MG tablet Generic drug:  pantoprazole 20 mg daily. Per feeding tube   senna 8.6 MG Tabs tablet Commonly known as:  SENOKOT Take 1 tablet (8.6 mg total) by mouth at bedtime  as needed for mild constipation. May be needed as pain medication can constipate. What changed:  how to take this  additional instructions   terazosin 1 MG capsule Commonly known as:  HYTRIN Place 1 capsule into feeding tube 2 (two) times daily.   traZODone 50 MG tablet Commonly known as:  DESYREL Take 50 mg by mouth at bedtime.   TYLENOL 8 HOUR PO 2 tablets by Feeding Tube route 2 (two) times daily as needed.            Home Infusion Instuctions        Start     Ordered   04/22/17 0000  Home infusion instructions [facility] May follow [place] Dosing Protocol; May administer Cathflo as needed to maintain patency of vascular access device.; Flushing of vascular access device: per [facility] Protocol: 0.9% NaCl pre/post medica...    Question Answer Comment  Instructions May follow [place] Dosing Protocol   Instructions May administer Cathflo as needed to maintain patency of vascular access device.   Instructions Flushing of vascular access device: per [facility] Protocol: 0.9% NaCl pre/post medication administration and prn patency; Heparin 100 u/ml, 54ml for implanted ports and Heparin 10u/ml, 75ml for all other central venous catheters.   Instructions May follow [facility] Anaphylaxis Protocol for First Dose Administration in the home: 0.9% NaCl at 25-50 ml/hr to maintain IV access  for protocol meds. Epinephrine 0.3 ml IV/IM PRN and Benadryl 25-50 IV/IM PRN s/s of anaphylaxis.   Instructions [facility] Infusion Coordinator (RN) to assist per patient IV care needs in the home PRN.      04/22/17 0939       Discharge Care Instructions        Start     Ordered   04/22/17 0000  Home infusion instructions [facility] May follow [place] Dosing Protocol; May administer Cathflo as needed to maintain patency of vascular access device.; Flushing of vascular access device: per [facility] Protocol: 0.9% NaCl pre/post medica...    Question Answer Comment  Instructions May  follow [place] Dosing Protocol   Instructions May administer Cathflo as needed to maintain patency of vascular access device.   Instructions Flushing of vascular access device: per [facility] Protocol: 0.9% NaCl pre/post medication administration and prn patency; Heparin 100 u/ml, 52ml for implanted ports and Heparin 10u/ml, 23ml for all other central venous catheters.   Instructions May follow [facility] Anaphylaxis Protocol for First Dose Administration in the home: 0.9% NaCl at 25-50 ml/hr to maintain IV access for protocol meds. Epinephrine 0.3 ml IV/IM PRN and Benadryl 25-50 IV/IM PRN s/s of anaphylaxis.   Instructions [facility] Infusion Coordinator (RN) to assist per patient IV care needs in the home PRN.      04/22/17 0939   04/22/17 0000  imipenem-cilastatin (PRIMAXIN) IVPB  Every 8 hours     04/22/17 0939   04/22/17 0000  Water For Irrigation, Sterile (FREE WATER) SOLN  Every 8 hours     04/22/17 0939   04/22/17 0000  insulin aspart (NOVOLOG) 100 UNIT/ML injection  Every 4 hours     04/22/17 0939      No Known Allergies  Consultations:  none   Procedures/Studies: Ct Soft Tissue Neck W Contrast  Result Date: 04/20/2017 CLINICAL DATA:  Fever. Jaw swelling. History of head and neck cancer. EXAM: CT NECK WITH CONTRAST TECHNIQUE: Multidetector CT imaging of the neck was performed using the standard protocol following the bolus administration of intravenous contrast. CONTRAST:  153mL ISOVUE-300 IOPAMIDOL (ISOVUE-300) INJECTION 61% COMPARISON:  CT neck March 19, 2017 FINDINGS: PHARYNX AND LARYNX: Postsurgical changes for section of LEFT retromolar trigone mass with flap. Narrowed though patent LEFT internal jugular vein. Granulation tissue LEFT neck contiguous with the LEFT sternocleidomastoid muscle. Status post LEFT neck dissection. No focal fluid collection, or acute inflammatory changes. Patent airway. SALIVARY GLANDS: Resected LEFT submandibular gland. Mildly hypertrophic LEFT  sublingual gland. Surgical clips LEFT parotid gland with possible partial LEFT parotidectomy. THYROID: Normal. LYMPH NODES: No lymphadenopathy by CT size criteria. VASCULAR: Mild calcific atherosclerosis aortic arch and carotid bifurcations. Approximate 50% stenosis RIGHT internal carotid artery on this non angiographic phase. LIMITED INTRACRANIAL: Normal. VISUALIZED ORBITS: Normal. MASTOIDS AND VISUALIZED PARANASAL SINUSES: Resected LEFT posterior maxillary antrum, well-aerated surgical bed extending into LEFT infratemporal fossa. Surgical clip LEFT pterygopalatine fossa. Paranasal sinus are well aerated. Small LEFT mastoid effusion without air cell coalescence. SKELETON: LEFT mandible plate and screw fixation with reconstruction, intact well-seated hardware. Slight offset of the posterior fusion at the angle of the mandible. Nondisplaced RIGHT nasal bone fracture. UPPER CHEST: Lung apices are clear. No superior mediastinal lymphadenopathy. OTHER: None. IMPRESSION: 1. No acute process in the neck. 2. Status post LEFT partial maxillectomy and reconstructive changes of LEFT mandible for head and neck tumor. LEFT upper neck flap and LEFT modified radical neck dissection. No CT findings of residual  or recurrent tumor though, MRI would be more sensitive in the setting of postoperative change. 3. Atherosclerosis resulting in suspected 50% stenosis RIGHT internal carotid artery on this non angiographic phase. Electronically Signed   By: Elon Alas M.D.   On: 04/20/2017 00:25   Ct Abdomen Pelvis W Contrast  Result Date: 04/20/2017 CLINICAL DATA:  Abdominal pain, onset today.  Fevers. EXAM: CT ABDOMEN AND PELVIS WITH CONTRAST TECHNIQUE: Multidetector CT imaging of the abdomen and pelvis was performed using the standard protocol following bolus administration of intravenous contrast. CONTRAST:  169mL ISOVUE-300 IOPAMIDOL (ISOVUE-300) INJECTION 61% COMPARISON:  01/12/2017, 05/28/2015. FINDINGS: Lower chest: No  acute abnormality. Hepatobiliary: No focal liver abnormality is seen. No gallstones, gallbladder wall thickening, or biliary dilatation. Pancreas: Pancreatic calcifications and mild atrophy consistent with chronic pancreatitis. No evidence of acute pancreatitis. No pancreatic parenchymal mass or cyst. Spleen: Normal in size without focal abnormality. Adrenals/Urinary Tract: Adrenal glands are unremarkable. Kidneys are normal, without renal calculi, significant focal lesion, or hydronephrosis. Bladder is remarkable only for Foley catheter. Stomach/Bowel: Percutaneous gastrostomy appears satisfactorily positioned. Stomach and small bowel are otherwise unremarkable. Normal appendix. Colonic diverticulosis without evidence of acute diverticulitis. Vascular/Lymphatic: The abdominal aorta is normal in caliber with moderate atherosclerotic calcification. No adenopathy is evident in the abdomen or pelvis. Reproductive: Unremarkable Other: No focal inflammation.  No ascites. Musculoskeletal: No significant skeletal lesion. IMPRESSION: 1. No acute findings are evident in the abdomen or pelvis. 2. Chronic pancreatitis. 3. Colonic diverticulosis. Electronically Signed   By: Andreas Newport M.D.   On: 04/20/2017 00:56        Discharge Exam: Vitals:   04/21/17 2129 04/22/17 0403  BP: (!) 115/59 (!) 106/59  Pulse: 79 80  Resp: 18 18  Temp: 98.2 F (36.8 C) 98.4 F (36.9 C)  SpO2: 98% 99%   Vitals:   04/21/17 0554 04/21/17 1425 04/21/17 2129 04/22/17 0403  BP: (!) 100/52 108/61 (!) 115/59 (!) 106/59  Pulse: 79 81 79 80  Resp: 14 20 18 18   Temp: 98.5 F (36.9 C) 98.6 F (37 C) 98.2 F (36.8 C) 98.4 F (36.9 C)  TempSrc: Oral Oral Oral Oral  SpO2: 98% 97% 98% 99%  Weight:      Height:        General: Pt is alert, awake, not in acute distress Cardiovascular: RRR, S1/S2 +, no rubs, no gallops Respiratory: CTA bilaterally, no wheezing, no rhonchi Abdominal: Soft, NT, ND, bowel sounds  + Extremities: no edema, no cyanosis   The results of significant diagnostics from this hospitalization (including imaging, microbiology, ancillary and laboratory) are listed below for reference.    Significant Diagnostic Studies: Ct Soft Tissue Neck W Contrast  Result Date: 04/20/2017 CLINICAL DATA:  Fever. Jaw swelling. History of head and neck cancer. EXAM: CT NECK WITH CONTRAST TECHNIQUE: Multidetector CT imaging of the neck was performed using the standard protocol following the bolus administration of intravenous contrast. CONTRAST:  136mL ISOVUE-300 IOPAMIDOL (ISOVUE-300) INJECTION 61% COMPARISON:  CT neck March 19, 2017 FINDINGS: PHARYNX AND LARYNX: Postsurgical changes for section of LEFT retromolar trigone mass with flap. Narrowed though patent LEFT internal jugular vein. Granulation tissue LEFT neck contiguous with the LEFT sternocleidomastoid muscle. Status post LEFT neck dissection. No focal fluid collection, or acute inflammatory changes. Patent airway. SALIVARY GLANDS: Resected LEFT submandibular gland. Mildly hypertrophic LEFT sublingual gland. Surgical clips LEFT parotid gland with possible partial LEFT parotidectomy. THYROID: Normal. LYMPH NODES: No lymphadenopathy by CT size criteria. VASCULAR: Mild calcific atherosclerosis aortic  arch and carotid bifurcations. Approximate 50% stenosis RIGHT internal carotid artery on this non angiographic phase. LIMITED INTRACRANIAL: Normal. VISUALIZED ORBITS: Normal. MASTOIDS AND VISUALIZED PARANASAL SINUSES: Resected LEFT posterior maxillary antrum, well-aerated surgical bed extending into LEFT infratemporal fossa. Surgical clip LEFT pterygopalatine fossa. Paranasal sinus are well aerated. Small LEFT mastoid effusion without air cell coalescence. SKELETON: LEFT mandible plate and screw fixation with reconstruction, intact well-seated hardware. Slight offset of the posterior fusion at the angle of the mandible. Nondisplaced RIGHT nasal bone fracture.  UPPER CHEST: Lung apices are clear. No superior mediastinal lymphadenopathy. OTHER: None. IMPRESSION: 1. No acute process in the neck. 2. Status post LEFT partial maxillectomy and reconstructive changes of LEFT mandible for head and neck tumor. LEFT upper neck flap and LEFT modified radical neck dissection. No CT findings of residual or recurrent tumor though, MRI would be more sensitive in the setting of postoperative change. 3. Atherosclerosis resulting in suspected 50% stenosis RIGHT internal carotid artery on this non angiographic phase. Electronically Signed   By: Elon Alas M.D.   On: 04/20/2017 00:25   Ct Abdomen Pelvis W Contrast  Result Date: 04/20/2017 CLINICAL DATA:  Abdominal pain, onset today.  Fevers. EXAM: CT ABDOMEN AND PELVIS WITH CONTRAST TECHNIQUE: Multidetector CT imaging of the abdomen and pelvis was performed using the standard protocol following bolus administration of intravenous contrast. CONTRAST:  154mL ISOVUE-300 IOPAMIDOL (ISOVUE-300) INJECTION 61% COMPARISON:  01/12/2017, 05/28/2015. FINDINGS: Lower chest: No acute abnormality. Hepatobiliary: No focal liver abnormality is seen. No gallstones, gallbladder wall thickening, or biliary dilatation. Pancreas: Pancreatic calcifications and mild atrophy consistent with chronic pancreatitis. No evidence of acute pancreatitis. No pancreatic parenchymal mass or cyst. Spleen: Normal in size without focal abnormality. Adrenals/Urinary Tract: Adrenal glands are unremarkable. Kidneys are normal, without renal calculi, significant focal lesion, or hydronephrosis. Bladder is remarkable only for Foley catheter. Stomach/Bowel: Percutaneous gastrostomy appears satisfactorily positioned. Stomach and small bowel are otherwise unremarkable. Normal appendix. Colonic diverticulosis without evidence of acute diverticulitis. Vascular/Lymphatic: The abdominal aorta is normal in caliber with moderate atherosclerotic calcification. No adenopathy is  evident in the abdomen or pelvis. Reproductive: Unremarkable Other: No focal inflammation.  No ascites. Musculoskeletal: No significant skeletal lesion. IMPRESSION: 1. No acute findings are evident in the abdomen or pelvis. 2. Chronic pancreatitis. 3. Colonic diverticulosis. Electronically Signed   By: Andreas Newport M.D.   On: 04/20/2017 00:56     Microbiology: Recent Results (from the past 240 hour(s))  Urine culture     Status: Abnormal   Collection Time: 04/19/17  8:51 PM  Result Value Ref Range Status   Specimen Description URINE, RANDOM  Final   Special Requests NONE  Final   Culture (A)  Final    >=100,000 COLONIES/mL KLEBSIELLA PNEUMONIAE Confirmed Extended Spectrum Beta-Lactamase Producer (ESBL) Performed at [facility], [address], [place], [place] [address]    Report Status 04/22/2017 FINAL  Final   Organism ID, Bacteria KLEBSIELLA PNEUMONIAE (A)  Final      Susceptibility   Klebsiella pneumoniae - MIC*    AMPICILLIN >=32 RESISTANT Resistant     CEFAZOLIN >=64 RESISTANT Resistant     CEFTRIAXONE >=64 RESISTANT Resistant     CIPROFLOXACIN 2 INTERMEDIATE Intermediate     GENTAMICIN <=1 SENSITIVE Sensitive     IMIPENEM <=0.25 SENSITIVE Sensitive     NITROFURANTOIN 128 RESISTANT Resistant     TRIMETH/SULFA >=320 RESISTANT Resistant     AMPICILLIN/SULBACTAM >=32 RESISTANT Resistant     PIP/TAZO 64 INTERMEDIATE Intermediate  Extended ESBL POSITIVE Resistant     * >=100,000 COLONIES/mL KLEBSIELLA PNEUMONIAE  Culture, blood (Routine x 2)     Status: None (Preliminary result)   Collection Time: 04/19/17  9:37 PM  Result Value Ref Range Status   Specimen Description BLOOD LEFT ARM  Final   Special Requests   Final    BOTTLES DRAWN AEROBIC AND ANAEROBIC Blood Culture adequate volume   Culture NO GROWTH 3 DAYS  Final   Report Status PENDING  Incomplete  Culture, blood (Routine x 2)     Status: None (Preliminary result)   Collection Time: 04/19/17  9:45 PM   Result Value Ref Range Status   Specimen Description BLOOD LEFT ARM  Final   Special Requests   Final    BOTTLES DRAWN AEROBIC AND ANAEROBIC Blood Culture adequate volume   Culture NO GROWTH 3 DAYS  Final   Report Status PENDING  Incomplete  MRSA PCR Screening     Status: None   Collection Time: 04/21/17 12:15 AM  Result Value Ref Range Status   MRSA by PCR NEGATIVE NEGATIVE Final    Comment:        The GeneXpert MRSA Assay (FDA approved for NASAL specimens only), is one component of a comprehensive MRSA colonization surveillance program. It is not intended to diagnose MRSA infection nor to guide or monitor treatment for MRSA infections.      Labs: Basic Metabolic Panel:  Recent Labs Lab 04/19/17 2137 04/20/17 0547 04/21/17 0638 04/22/17 0613  NA 132* 132* 137 140  K 4.8 5.0 4.9 3.9  CL 95* 97* 102 106  CO2 28 25 27 28   GLUCOSE 173* 138* 195* 47*  BUN 39* 30* 31* 28*  CREATININE 1.17 1.05 1.07 0.96  CALCIUM 9.6 9.5 9.2 9.6  MG  --   --  2.0  --    Liver Function Tests:  Recent Labs Lab 04/19/17 2137 04/20/17 0547  AST 17 17  ALT 23 24  ALKPHOS 95 88  BILITOT 0.2* 0.4  PROT 7.7 7.3  ALBUMIN 3.4* 3.2*    Recent Labs Lab 04/19/17 2137  LIPASE 24   No results for input(s): AMMONIA in the last 168 hours. CBC:  Recent Labs Lab 04/19/17 2137 04/20/17 0547  WBC 10.2 8.5  NEUTROABS 8.3*  --   HGB 9.1* 9.1*  HCT 28.1* 27.8*  MCV 79.2 79.0  PLT 330 307   Cardiac Enzymes: No results for input(s): CKTOTAL, CKMB, CKMBINDEX, TROPONINI in the last 168 hours. BNP: Invalid input(s): POCBNP CBG:  Recent Labs Lab 04/21/17 2157 04/22/17 0012 04/22/17 0402 04/22/17 0718 04/22/17 0836  GLUCAP 87 268* 161* 58* 151*    Time coordinating discharge:  Greater than 30 minutes  Signed:  Domenique Quest, DO [facility] Pager: [phone] 04/22/2017, 9:40 AM

## 2017-04-22 NOTE — Care Management Note (Signed)
Case Management Note  Patient Details  Name: Colin Wagner MRN: [id number] Date of Birth: [birth year]   Expected Discharge Date:  04/22/17               Expected Discharge Plan:  [place]  In-House Referral:  NA  Discharge planning Services  CM Consult  Post Acute Care Choice:  Home Health, Resumption of Svcs/PTA Provider Choice offered to:  Patient  HH Arranged:  RN, PT, IV Antibiotics HH Agency:  [facility]  Status of Service:  Completed, signed off  Additional Comments: DC home today. [facility] aware of DC. They will meet pt at home for 2pm dose of abx. Pt aware of plan. No further needs/concerns communicated at time of DC.   Sherald Barge, RN 04/22/2017, 10:39 AM

## 2017-04-22 NOTE — Progress Notes (Signed)
Pt D/C instructions given to pt and family member. Verbalized understanding. Home health nurse to follow up.

## 2017-04-24 LAB — CULTURE, BLOOD (ROUTINE X 2)
CULTURE: NO GROWTH
Culture: NO GROWTH
SPECIAL REQUESTS: ADEQUATE
Special Requests: ADEQUATE

## 2017-04-25 ENCOUNTER — Other Ambulatory Visit (HOSPITAL_COMMUNITY)
Admission: RE | Admit: 2017-04-25 | Discharge: 2017-04-25 | Disposition: A | Discharge status: Home / Self Care | Payer: Medicare Other | Source: Other Acute Inpatient Hospital | Attending: Family Medicine | Admitting: Family Medicine

## 2017-04-25 DIAGNOSIS — E119 Type 2 diabetes mellitus without complications: Secondary | ICD-10-CM | POA: Diagnosis present

## 2017-04-25 LAB — CBC WITH DIFFERENTIAL/PLATELET
Basophils Absolute: 0 10*3/uL (ref 0.0–0.1)
Basophils Relative: 0 %
Eosinophils Absolute: 0.2 10*3/uL (ref 0.0–0.7)
Eosinophils Relative: 3 %
HEMATOCRIT: 27.4 % — AB (ref 39.0–52.0)
HEMOGLOBIN: 9 g/dL — AB (ref 13.0–17.0)
LYMPHS ABS: 1.4 10*3/uL (ref 0.7–4.0)
Lymphocytes Relative: 19 %
MCH: 26.5 pg (ref 26.0–34.0)
MCHC: 32.8 g/dL (ref 30.0–36.0)
MCV: 80.8 fL (ref 78.0–100.0)
MONO ABS: 0.3 10*3/uL (ref 0.1–1.0)
MONOS PCT: 4 %
NEUTROS ABS: 5.5 10*3/uL (ref 1.7–7.7)
NEUTROS PCT: 74 %
Platelets: 352 10*3/uL (ref 150–400)
RBC: 3.39 MIL/uL — ABNORMAL LOW (ref 4.22–5.81)
RDW: 15.8 % — AB (ref 11.5–15.5)
WBC: 7.4 10*3/uL (ref 4.0–10.5)

## 2017-04-25 LAB — CREATININE, SERUM
Creatinine, Ser: 1.09 mg/dL (ref 0.61–1.24)
GFR calc Af Amer: >60 mL/min (ref >60)
GFR calc non Af Amer: >60 mL/min (ref >60)

## 2017-04-25 LAB — BUN: BUN: 39 mg/dL — ABNORMAL HIGH (ref 6–20)

## 2017-04-26 ENCOUNTER — Ambulatory Visit: Payer: Self-pay | Admitting: Urology

## 2017-04-26 ENCOUNTER — Telehealth (HOSPITAL_COMMUNITY): Payer: Self-pay | Admitting: Dentistry

## 2017-04-26 NOTE — Telephone Encounter (Signed)
04/26/17 Called and left msgs on pt's home/mobile #'s multiple times to contact Dental Medicine .  LRI

## 2017-04-27 ENCOUNTER — Ambulatory Visit
Admission: RE | Admit: 2017-04-27 | Discharge: 2017-04-27 | Disposition: A | Discharge status: Home / Self Care | Payer: Medicare Other | Source: Ambulatory Visit | Attending: Radiation Oncology | Admitting: Radiation Oncology

## 2017-04-27 ENCOUNTER — Encounter: Payer: Self-pay | Admitting: *Deleted

## 2017-04-27 ENCOUNTER — Encounter (HOSPITAL_COMMUNITY): Payer: Self-pay | Admitting: Dentistry

## 2017-04-27 ENCOUNTER — Encounter: Payer: Self-pay | Admitting: Radiation Oncology

## 2017-04-27 ENCOUNTER — Ambulatory Visit (HOSPITAL_COMMUNITY): Payer: Medicaid - Dental | Admitting: Dentistry

## 2017-04-27 VITALS — BP 114/63 | HR 96 | Temp 97.7°F

## 2017-04-27 VITALS — BP 103/58 | HR 105 | Temp 97.4°F | Resp 18 | Wt 160.2 lb

## 2017-04-27 DIAGNOSIS — C060 Malignant neoplasm of cheek mucosa: Secondary | ICD-10-CM | POA: Diagnosis present

## 2017-04-27 DIAGNOSIS — K036 Deposits [accretions] on teeth: Secondary | ICD-10-CM

## 2017-04-27 DIAGNOSIS — I10 Essential (primary) hypertension: Secondary | ICD-10-CM | POA: Diagnosis not present

## 2017-04-27 DIAGNOSIS — J449 Chronic obstructive pulmonary disease, unspecified: Secondary | ICD-10-CM | POA: Diagnosis not present

## 2017-04-27 DIAGNOSIS — Z905 Acquired absence of kidney: Secondary | ICD-10-CM | POA: Diagnosis not present

## 2017-04-27 DIAGNOSIS — K030 Excessive attrition of teeth: Secondary | ICD-10-CM

## 2017-04-27 DIAGNOSIS — Z01818 Encounter for other preprocedural examination: Secondary | ICD-10-CM

## 2017-04-27 DIAGNOSIS — E119 Type 2 diabetes mellitus without complications: Secondary | ICD-10-CM | POA: Diagnosis not present

## 2017-04-27 DIAGNOSIS — Z8711 Personal history of peptic ulcer disease: Secondary | ICD-10-CM | POA: Insufficient documentation

## 2017-04-27 DIAGNOSIS — R51 Headache: Secondary | ICD-10-CM | POA: Insufficient documentation

## 2017-04-27 DIAGNOSIS — Z85528 Personal history of other malignant neoplasm of kidney: Secondary | ICD-10-CM | POA: Insufficient documentation

## 2017-04-27 DIAGNOSIS — F1011 Alcohol abuse, in remission: Secondary | ICD-10-CM | POA: Diagnosis not present

## 2017-04-27 DIAGNOSIS — Z7984 Long term (current) use of oral hypoglycemic drugs: Secondary | ICD-10-CM | POA: Diagnosis not present

## 2017-04-27 DIAGNOSIS — M264 Malocclusion, unspecified: Secondary | ICD-10-CM

## 2017-04-27 DIAGNOSIS — K219 Gastro-esophageal reflux disease without esophagitis: Secondary | ICD-10-CM | POA: Diagnosis not present

## 2017-04-27 DIAGNOSIS — Z9889 Other specified postprocedural states: Secondary | ICD-10-CM | POA: Diagnosis not present

## 2017-04-27 DIAGNOSIS — K0530 Chronic periodontitis, unspecified: Secondary | ICD-10-CM

## 2017-04-27 DIAGNOSIS — F17210 Nicotine dependence, cigarettes, uncomplicated: Secondary | ICD-10-CM | POA: Diagnosis not present

## 2017-04-27 DIAGNOSIS — R252 Cramp and spasm: Secondary | ICD-10-CM

## 2017-04-27 DIAGNOSIS — C062 Malignant neoplasm of retromolar area: Secondary | ICD-10-CM

## 2017-04-27 DIAGNOSIS — E785 Hyperlipidemia, unspecified: Secondary | ICD-10-CM | POA: Diagnosis not present

## 2017-04-27 DIAGNOSIS — K06010 Localized gingival recession, unspecified: Secondary | ICD-10-CM

## 2017-04-27 DIAGNOSIS — K08409 Partial loss of teeth, unspecified cause, unspecified class: Secondary | ICD-10-CM

## 2017-04-27 NOTE — Patient Instructions (Signed)
RADIATION THERAPY AND DECISIONS REGARDING YOUR TEETH  Xerostomia (dry mouth) Your salivary glands may be in the filed of radiation.  Radiation may include all or part of your saliva glands.  This will cause your saliva to dry up and you will have a dry mouth.  The dry mouth will be for the rest of your life unless your radiation oncologist tells you otherwise.  Your saliva has many functions:  Saliva wets your tongue for speaking.  It coats your teeth and the inside of your mouth for easier movement.  It helps with chewing and swallowing food.  It helps clean away harmful acid and toxic products made by the germs in your mouth, therefore it helps prevent cavities.  It kills some germs in your mouth and helps to prevent gum disease.  It helps to carry flavor to your taste buds.  Once you have lost your saliva you will be at higher risk for tooth decay and gum disease.  What can be done to help improve your mouth when there's not enough saliva:  1.  Your dentist may give a prescription for Salagen.  It will not bring back all of your saliva but may bring back some of it.  Also your saliva may be thick and ropy or white and foamy. It will not feel like it use to feel.  2.  You will need to swish with water every time your mouth feels dry.  YOU CANNOT suck on any cough drops, mints, lemon drops, candy, vitamin C or any other products.  You cannot use anything other than water to make your mouth feel less dry.  If you want to drink anything else you have to drink it all at once and brush afterwards.  Be sure to discuss the details of your diet habits with your dentist or hygienist.  Radiation caries: This is decay that happens very quickly once your mouth is very dry due to radiation therapy.  Normally cavities take six months to two years to become a problem.  When you have dry mouth cavities may take as little as eight weeks to cause you a problem.  This is why dental check ups every two  months are necessary as long as you have a dry mouth. Radiation caries typically, but not always, start at your gum line where it is hard to see the cavity.  It is therefore also hard to fill these cavities adequately.  This high rate of cavities happens because your mouth no longer has saliva and therefore the acid made by the germs starts the decay process.  Whenever you eat anything the germs in your mouth change the food into acid.  The acid then burns a small hole in your tooth.  This small hole is the beginning of a cavity.  If this is not treated then it will grow bigger and become a cavity.  The way to avoid this hole getting bigger is to use fluoride every evening as prescribed by your dentist.  You have to make sure that your teeth are very clean before you use the fluoride.  This fluoride in turn will strengthen your teeth and prepare them for another day of fighting acid.  If you develop radiation caries many times the damage is so large that you will have to have all your teeth removed.  This could be a big problem if some of these teeth are in the field of radiation.  Further details of why this could be   a big problem will follow.  (See Osteoradionecrosis).  Loss of taste (dysgeusia) This happens to varying degrees once you've had radiation therapy to your jaw region.  Many times taste is not completely lost but becomes limited.  The loss of taste is mostly due to radiation affecting your taste buds.  However if you have no saliva in your mouth to carry the flavor to your taste buds it would be difficult for your taste buds to taste anything.  That is why using water or a prescription for Salagen prior to meals and during meals may help with some of the taste.  Keep in mind that taste generally returns very slowly over the course of several months or several years after radiation therapy.  Don't give up hope.  Trismus According to your Radiation Oncologist your TMJ or jaw joints are going to be  partially or fully in the field of radiation.  This means that over time the muscles that help you open and close your mouth may get stiff.  This will potentially result in your not being able to open your mouth wide enough or as wide as you can open it now.  Le me give you an example of how slowly this happens and how unaware people are of it.  A gentlemen that had radiation therapy two years ago came back to me complaining that bananas are just too large for him to be able to fit them in between his teeth.  He was not able to open wide enough to bite into a banana.  This happens slowly and over a period of time.  What do we do to try and prevent this?  Your dentist will probably give you a stack of sticks called a trismus exercise device .  This stack will help your remind your muscles and your jaw joint to open up to the same distance every day.  Use these sticks every morning when you wake up according to the instructions given by the dentist.   You must use these sticks for at least one to two years after radiation therapy.  The reason for that is because it happens so slowly and keeps going on for about two years after radiation therapy.  Your hospital dentist will help you monitor your mouth opening and make sure that it's not getting smaller.  Osteoradionecrosis (ORN) This is a condition where your jaw bone after having had radiation therapy becomes very dry.  It has very little blood supply to keep it alive.  If you develop a cavity that turns into an abscess or an infection then the jaw bone does not have enough blood supply to help fight the infection.  At this point it is very likely that the infection could cause the death of your jaw bone.  When you have dead bone it has to be removed.  Therefore you might end up having to have surgery to remove part of your jaw bone, the part of the jaw bone that has been affected.   Healing is also a problem if you are to have surgery in the areas where the bone  has had radiation therapy.  The same reasons apply.  If you have surgery you need more blood supply which is not available.  When blood supply and oxygen are not available again, there is a chance for the bone to die.  Occasionally ORN happens on its own with no obvious reason.  This is quite rare.  We believe that   patients who continue to smoke and/or drink alcohol have a higher chance of having this bone problem.  Therefore once your jaw bone has had radiation therapy if there are any teeth in that area, you should never have them pulled.  You should also never have any surgery on your teeth or gums in that area unless the oral surgeon or Periodontist is aware of your history of radiation. There is some expensive management techniques that might be used to limit your risks.  The risks for ORN either from infection or spontaneous ( or on it's own) are life long.     

## 2017-04-27 NOTE — Progress Notes (Signed)
Radiation Oncology         ([phone]) [phone] ________________________________  Name: Colin Wagner MRN: [id number]  Date: 04/27/2017  DOB: 1951-06-12  Follow-Up Visit  Note  outpatient  CC: [name], MD  [name], MD  Diagnosis and Prior Radiotherapy:    ICD-10-CM   1. 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 He presents today with his friend. Overall, he is doing okay. He is currently using a PEG tube and is experiencing difficulty swallowing.   His surgery was on 03/01/17 by Dr Justin Mend. Pathologic stage was T4aN1, he underwent composite resection of left mandible, maxilla, buccal mucosa, pharynx, infratemporal fossa, revealing tumor size of 4.5cm, invasive squamous cell carcinoma, moderately differentiated, invading mandibular bone. No definite PNI, margins negative. Depth of invasion was over 52mm, closest margin was <21mm in the deep soft tissue. 1/81 lymph nodes was positive. There was no ECE.   The pt consulted with Dr Tharon Aquas. They recommended radiotherapy adjuvantly, and initially the patient had plans to pursue this at [facility], however, he decided instead to have this closer to home.   The pt has had trismus post-operatively and has seen Dr Desmond Dike, DMD. She explained that dental extractions may be needed in the future, but it does no appear that these were performed. He was seen by Dr Enrique Sack again today, he recommended that they could purse dental extractions in the future if needed, but given the time that has elapsed from surgery, extractions may not be worth the delay at this point in his treatment timelime.   Of note, pt did have a CT Chest performed on 03/11/17. This showed some atelectasis or atelectatic PNA, with majority of the lungs being clear otherwise. There was no change in  a previously visualized 53mm subpleural nodule in the left lower lung, ruled as benign.   ALLERGIES:  has No Known Allergies.  Meds: Current Outpatient Prescriptions  Medication Sig Dispense Refill  . Acetaminophen (TYLENOL 8 HOUR PO) 2 tablets by Feeding Tube route 2 (two) times daily as needed.     Marland Kitchen albuterol (PROVENTIL HFA;VENTOLIN HFA) 108 (90 Base) MCG/ACT inhaler Inhale 1-2 puffs into the lungs every 6 (six) hours as needed for wheezing or shortness of breath. 1 Inhaler 0  . cetirizine (ZYRTEC) 10 MG tablet Place 10 mg into feeding tube at bedtime.   11  . HYDROcodone-acetaminophen (NORCO) 7.5-325 MG tablet Take 1 tablet by mouth every 6 (six) hours as needed (pain). (Patient taking differently: 1 tablet every 6 (six) hours as needed (pain). Per feeding tube) 60 tablet 0  . imipenem-cilastatin (PRIMAXIN) IVPB Inject 500 mg into the vein every 8 (eight) hours. Indication:  MDR UTI Last Day of Therapy:  04/26/17 14 Units 0  . insulin aspart (NOVOLOG) 100 UNIT/ML injection Inject 0-9 Units into the skin every 4 (four) hours. 70-120--No units; 121 to 150--1 unit; 151 to 200--2 units; 201 to 250--3 units; 251 to 300--5 units; 301 to 350--7 units; 351 to 400--9 units; >400--call MD 10 mL 0  . LANTUS 100 UNIT/ML injection Inject 25 Units into the skin every evening.   1  . LORazepam (ATIVAN) 1 MG tablet Take 1 mg by mouth 4 (four) times daily as needed for anxiety. For anxiety  5  . pantoprazole (PROTONIX) 20 MG tablet 20 mg daily.  Per feeding tube    . senna (SENOKOT) 8.6 MG TABS tablet Take 1 tablet (8.6 mg total) by mouth at bedtime as needed for mild constipation. May be needed as pain medication can constipate. (Patient taking differently: Place 1 tablet into feeding tube at bedtime as needed for mild constipation. May be needed as pain medication can constipate.) 120 each 1  . terazosin (HYTRIN) 1 MG capsule Place 1 capsule into feeding tube 2 (two) times daily.     . traZODone (DESYREL) 50  MG tablet Take 50 mg by mouth at bedtime.    . Water For Irrigation, Sterile (FREE WATER) SOLN Place 100 mLs into feeding tube every 8 (eight) hours. 1000 mL 0  . atorvastatin (LIPITOR) 40 MG tablet     . B-D UF III MINI PEN NEEDLES 31G X 5 MM MISC   3  . B-D ULTRAFINE III SHORT PEN 31G X 8 MM MISC     . fluconazole (DIFLUCAN) 100 MG tablet     . lisinopril (PRINIVIL,ZESTRIL) 5 MG tablet     . LORazepam (ATIVAN) 0.5 MG tablet     . sodium chloride 0.9 % infusion     . testosterone cypionate (DEPOTESTOSTERONE CYPIONATE) 200 MG/ML injection      No current facility-administered medications for this encounter.    Physical Findings: The patient is in no acute distress. Patient is alert and oriented.  weight is 160 lb 4 oz (72.7 kg). His oral temperature is 97.4 F (36.3 C) (abnormal). His blood pressure is 103/58 (abnormal) and his pulse is 105 (abnormal). His respiration is 18 and oxygen saturation is 99%. .     General: Alert and oriented, in no acute distress HEENT: Head is normocephalic. Extraocular movements are intact. Trismus. Oropharynx is clear. Thick secretions are present within the mouth. He has graft reconstruction along the buccal mucosa along the soft palate of the left oral cavity. No signs of tumor recurrence. The graft appears healthy. He is missing many of his posterior on all levels.  Neck: Neck is supple, no obvious palpable cervical or supraclavicular lymphadenopathy. He has lymphedema in the left face and upper neck. Surgical scars are healing well along the neck.  Heart: Regular in rate and rhythm with no murmurs, rubs, or gallops. Chest: Clear to auscultation bilaterally, with no rhonchi, wheezes, or rales. Abdomen: The dressing on the PEG site is clean and intact. Extremities: No cyanosis or edema. PICC line in place in the right upper arm.  Lymphatics: see Neck Exam Skin: No concerning lesions. Bandage is on the left back which was not removed from the graft donor  site.  Musculoskeletal: symmetric strength and muscle tone throughout. Genitourinary: He has a foley catheter in place.  Neurologic: Coordination is intact. Strength is symmetric with exception to difficulty raising the left arm from the graft harvesting.  Psychiatric: Judgment and insight are intact. Affect is appropriate.  Lab Findings: Lab Results  Component Value Date   WBC 7.4 04/25/2017   HGB 9.0 (L) 04/25/2017   HCT 27.4 (L) 04/25/2017   MCV 80.8 04/25/2017   PLT 352 04/25/2017    Radiographic Findings: Ct Soft Tissue Neck W Contrast  Result Date: 04/20/2017 CLINICAL DATA:  Fever. Jaw swelling. History of head and neck cancer. EXAM: CT NECK WITH CONTRAST TECHNIQUE: Multidetector CT imaging of the neck was performed using the standard protocol following the bolus administration of intravenous contrast. CONTRAST:  161mL ISOVUE-300 IOPAMIDOL (ISOVUE-300) INJECTION 61% COMPARISON:  CT neck March 19, 2017 FINDINGS: PHARYNX AND LARYNX: Postsurgical changes for section of LEFT retromolar trigone mass with flap. Narrowed though patent LEFT internal jugular vein. Granulation tissue LEFT neck contiguous with the LEFT sternocleidomastoid muscle. Status post LEFT neck dissection. No focal fluid collection, or acute inflammatory changes. Patent airway. SALIVARY GLANDS: Resected LEFT submandibular gland. Mildly hypertrophic LEFT sublingual gland. Surgical clips LEFT parotid gland with possible partial LEFT parotidectomy. THYROID: Normal. LYMPH NODES: No lymphadenopathy by CT size criteria. VASCULAR: Mild calcific atherosclerosis aortic arch and carotid bifurcations. Approximate 50% stenosis RIGHT internal carotid artery on this non angiographic phase. LIMITED INTRACRANIAL: Normal. VISUALIZED ORBITS: Normal. MASTOIDS AND VISUALIZED PARANASAL SINUSES: Resected LEFT posterior maxillary antrum, well-aerated surgical bed extending into LEFT infratemporal fossa. Surgical clip LEFT pterygopalatine fossa.  Paranasal sinus are well aerated. Small LEFT mastoid effusion without air cell coalescence. SKELETON: LEFT mandible plate and screw fixation with reconstruction, intact well-seated hardware. Slight offset of the posterior fusion at the angle of the mandible. Nondisplaced RIGHT nasal bone fracture. UPPER CHEST: Lung apices are clear. No superior mediastinal lymphadenopathy. OTHER: None. IMPRESSION: 1. No acute process in the neck. 2. Status post LEFT partial maxillectomy and reconstructive changes of LEFT mandible for head and neck tumor. LEFT upper neck flap and LEFT modified radical neck dissection. No CT findings of residual or recurrent tumor though, MRI would be more sensitive in the setting of postoperative change. 3. Atherosclerosis resulting in suspected 50% stenosis RIGHT internal carotid artery on this non angiographic phase. Electronically Signed   By: Elon Alas M.D.   On: 04/20/2017 00:25   Ct Abdomen Pelvis W Contrast  Result Date: 04/20/2017 CLINICAL DATA:  Abdominal pain, onset today.  Fevers. EXAM: CT ABDOMEN AND PELVIS WITH CONTRAST TECHNIQUE: Multidetector CT imaging of the abdomen and pelvis was performed using the standard protocol following bolus administration of intravenous contrast. CONTRAST:  138mL ISOVUE-300 IOPAMIDOL (ISOVUE-300) INJECTION 61% COMPARISON:  01/12/2017, 05/28/2015. FINDINGS: Lower chest: No acute abnormality. Hepatobiliary: No focal liver abnormality is seen. No gallstones, gallbladder wall thickening, or biliary dilatation. Pancreas: Pancreatic calcifications and mild atrophy consistent with chronic pancreatitis. No evidence of acute pancreatitis. No pancreatic parenchymal mass or cyst. Spleen: Normal in size without focal abnormality. Adrenals/Urinary Tract: Adrenal glands are unremarkable. Kidneys are normal, without renal calculi, significant focal lesion, or hydronephrosis. Bladder is remarkable only for Foley catheter. Stomach/Bowel: Percutaneous gastrostomy  appears satisfactorily positioned. Stomach and small bowel are otherwise unremarkable. Normal appendix. Colonic diverticulosis without evidence of acute diverticulitis. Vascular/Lymphatic: The abdominal aorta is normal in caliber with moderate atherosclerotic calcification. No adenopathy is evident in the abdomen or pelvis. Reproductive: Unremarkable Other: No focal inflammation.  No ascites. Musculoskeletal: No significant skeletal lesion. IMPRESSION: 1. No acute findings are evident in the abdomen or pelvis. 2. Chronic pancreatitis. 3. Colonic diverticulosis. Electronically Signed   By: Andreas Newport M.D.   On: 04/20/2017 00:56   Impression/Plan:   This is a delightful patient with head and neck cancer. I recommend adjuvant radiotherapy for this patient.  We discussed the potential risks, benefits, and side effects of radiotherapy. We talked in detail about acute and late effects. We discussed that some of the most bothersome acute effects may be mucositis, dysgeusia, salivary changes, skin irritation, hair loss, dehydration, weight loss and fatigue. We talked about late effects which include but are not necessarily limited to dysphagia, nonhealing wounds, hypothyroidism, nerve injury, spinal cord injury, xerostomia, trismus, and neck edema. No guarantees of treatment were given. A consent form was signed and placed  in the patient's medical record. The patient is enthusiastic about proceeding with treatment. I look forward to participating in the patient's care.    Simulation (treatment planning) will take place in the near future.   We also discussed that the treatment of head and neck cancer is a multidisciplinary process to maximize treatment outcomes and quality of life. For this reasons the following referrals have been or will be made:  He followed up with Dr Enrique Sack, DDS, today regarding dental extractions. The patient had noted to him that he does not wish to proceed with dental extractions  at this time. The patient was informed about the continued need for periodontal maintenance procedures and he is aware of the need for follow-up with a prosthodontist for prosthodontic rehabilitation following radiation therapy. He was cleared for radiation therapy by Dr Enrique Sack at that time.   Nutritionist for nutrition support during and after treatment.  PEG tube: yes  Speech language pathology for swallowing and/or speech therapy.  Social work for social support.   Physical therapy due to risk of lymphedema in neck and deconditioning.  Baseline labs including TSH.  He would like an open face mask during treatment - noted. _____________________________________   [name], MD  This document serves as a record of services personally performed by [name], MD. It was created on his behalf by Reola Mosher, a trained medical scribe. The creation of this record is based on the scribe's personal observations and the provider's statements to them. This document has been checked and approved by the attending provider.

## 2017-04-27 NOTE — Progress Notes (Signed)
Progress note   Patient Name:   Colin Wagner Date of Birth:   1950-12-06 Medical Record Number: [id number]  VITALS: BP 114/63 (BP Location: Left Arm)   Pulse 96   Temp 97.7 F (36.5 C) (Oral)   CHIEF COMPLAINT: Patient initially referred by Dr. Isidore Moos for a dental consultation prior to radiation therapy and was seen on 01/06/2017. The patient now presents for a pre-radiation therapy follow-up examination after surgical resection of the cancer with reconstructive surgery at [facility] on 03/01/2017.  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  The patient had been having discomfort on the left side of his mouth. Patient saw his primary dentist , Dr. Abel Presto in April 2018 and was then referred to an oral surgeon, Dr. Otilio Carpen.  Dr. Otilio Carpen then performed a biopsy. Patient was diagnosed with squamous cell carcinoma of the retromolar trigone at that time. Patient proceeded with surgical resection at [facility] on 03/01/17 with anticipated postoperative radiation therapy with Dr. Isidore Moos. Dr. Isidore Moos is to see the patient later this afternoon.  The patient currently denies having any toothaches, swellings, or abscesses.  Patient is having some difficulty with trismus symptoms but has been doing exercises according to directions from the [facility] physicians.    PROBLEM LIST: Patient Active Problem List   Diagnosis Date Noted  . UTI (urinary tract infection) 04/20/2017  . UTI due to Klebsiella species 04/20/2017  . Essential hypertension 04/20/2017  . Chronic pancreatitis ([place]) 04/20/2017  . 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 Malnutrition of moderate degree 04/20/2017  . Complicated UTI (urinary tract infection)   . 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 Rectal bleeding 09/28/2016  . Hyponatremia 04/28/2015  .  Abdominal pain 04/27/2015  . Acute pancreatitis 04/27/2015  . Chronic alcoholic pancreatitis ([place]) 04/27/2015  . ETOH abuse 04/27/2015  . DM type 2 (diabetes mellitus, type 2) ([place]) 04/27/2015  . Hypertension, uncontrolled 04/27/2015  . Hyperlipidemia 04/27/2015  . Hypertensive urgency 04/27/2015  . Alcohol abuse     PMH: Past Medical History:  Diagnosis Date  . Alcohol abuse   . Alcoholic pancreatitis 5456   admission  . Chronic back pain   . Chronic pancreatitis ([place])    based on ct findings 2016  . COPD (chronic obstructive pulmonary disease) ([place])   . Diabetes mellitus   . Diverticulosis   . Gastritis   . GERD (gastroesophageal reflux disease)   . Headache   . Hyperlipidemia   . Hypertension   . Peptic ulcer disease 1999   Per medical reports, no H pylori  . Renal cancer, left ([place]) 2012   he tells me that he has been released, ?and that he is free of cancer, and never had it to begin with.   . Right shoulder pain   . Testicular hypofunction     PSH: Past Surgical History:  Procedure Laterality Date  . COLONOSCOPY  2003   Dr. [name], polyps  . COLONOSCOPY  2005   Dr. [name], multiple diverticula  . COLONOSCOPY  2008   Dr. [name], diverticulosis  . COLONOSCOPY WITH PROPOFOL N/A 10/21/2016   Procedure: COLONOSCOPY WITH PROPOFOL;  Surgeon: [name], MD;  [facility];  Service: Endoscopy;  Laterality: N/A;  8:30 am  . ESOPHAGOGASTRODUODENOSCOPY     Multiple EGDs. 1999 EGD showed gastric ulcers, no  H pylori and benign biopsies performed by Dr. [name]. 2001 gastric ulcer healed. Last EGD 2005 had gastritis.  . Left partial mandibulectomy, Scapular free flap reconstruction, selective neck dissection, tracheotomy, and resection of intraoral palate cancer. Left 03/01/2017   [facility]  . LUNG BIOPSY    . PARTIAL NEPHRECTOMY Left 2012  . POLYPECTOMY  10/21/2016   Procedure: POLYPECTOMY;  Surgeon: [name],  MD;  Location: [facility];  Service: Endoscopy;;  hepatic flexure x2     ALLERGIES: No Known Allergies  MEDICATIONS: Current Outpatient Prescriptions  Medication Sig Dispense Refill  . Acetaminophen (TYLENOL 8 HOUR PO) 2 tablets by Feeding Tube route 2 (two) times daily as needed.     Marland Kitchen albuterol (PROVENTIL HFA;VENTOLIN HFA) 108 (90 Base) MCG/ACT inhaler Inhale 1-2 puffs into the lungs every 6 (six) hours as needed for wheezing or shortness of breath. 1 Inhaler 0  . cetirizine (ZYRTEC) 10 MG tablet Place 10 mg into feeding tube at bedtime.   11  . glipiZIDE (GLUCOTROL) 5 MG tablet Place 5 mg into feeding tube daily as needed (for high blood sugar). PT said he takes it as needed ( if BS gets up to 200 he said)     . HYDROcodone-acetaminophen (NORCO) 7.5-325 MG tablet Take 1 tablet by mouth every 6 (six) hours as needed (pain). (Patient taking differently: 1 tablet every 6 (six) hours as needed (pain). Per feeding tube) 60 tablet 0  . imipenem-cilastatin (PRIMAXIN) IVPB Inject 500 mg into the vein every 8 (eight) hours. Indication:  MDR UTI Last Day of Therapy:  04/26/17 14 Units 0  . insulin aspart (NOVOLOG) 100 UNIT/ML injection Inject 0-9 Units into the skin every 4 (four) hours. 70-120--No units; 121 to 150--1 unit; 151 to 200--2 units; 201 to 250--3 units; 251 to 300--5 units; 301 to 350--7 units; 351 to 400--9 units; >400--call MD 10 mL 0  . LANTUS 100 UNIT/ML injection Inject 25 Units into the skin every evening.   1  . LORazepam (ATIVAN) 1 MG tablet Take 1 mg by mouth 4 (four) times daily as needed for anxiety. For anxiety  5  . pantoprazole (PROTONIX) 20 MG tablet 20 mg daily. Per feeding tube    . senna (SENOKOT) 8.6 MG TABS tablet Take 1 tablet (8.6 mg total) by mouth at bedtime as needed for mild constipation. May be needed as pain medication can constipate. (Patient taking differently: Place 1 tablet into feeding tube at bedtime as needed for mild constipation. May be needed as  pain medication can constipate.) 120 each 1  . terazosin (HYTRIN) 1 MG capsule Place 1 capsule into feeding tube 2 (two) times daily.     . traZODone (DESYREL) 50 MG tablet Take 50 mg by mouth at bedtime.    . Water For Irrigation, Sterile (FREE WATER) SOLN Place 100 mLs into feeding tube every 8 (eight) hours. 1000 mL 0   No current facility-administered medications for this visit.     LABS: Lab Results  Component Value Date   WBC 7.4 04/25/2017   HGB 9.0 (L) 04/25/2017   HCT 27.4 (L) 04/25/2017   MCV 80.8 04/25/2017   PLT 352 04/25/2017      Component Value Date/Time   NA 140 04/22/2017 0613   K 3.9 04/22/2017 0613   CL 106 04/22/2017 0613   CO2 28 04/22/2017 0613   GLUCOSE 47 (L) 04/22/2017 0613   BUN 39 (H) 04/25/2017 1227   CREATININE 1.09  04/25/2017 1227   CALCIUM 9.6 04/22/2017 0613   GFRNONAA >60 04/25/2017 1227   GFRAA >60 04/25/2017 1227   Lab Results  Component Value Date   INR 1.08 04/19/2017   INR  11/04/2007    1.0 Results Called to:  SURLES,M @ 2114 ON 11/05/07 BY WOODIE,J CORRECTED ON 03/14 AT 2110: PREVIOUSLY REPORTED AS NOT CALCULATED   No results found for: PTT  SOCIAL HISTORY: Social History   Social History  . Marital status: Divorced    Spouse name: N/A  . Number of children: 0  . Years of education: N/A   Occupational History  . Not on file.   Social History Main Topics  . Smoking status: Former Smoker    Packs/day: 0.50    Years: 35.00  . Smokeless tobacco: Never Used     Comment: Only smoking a few cigarettes now  . Alcohol use No     Comment: quit 2000 but relapse in 2016. no etoh since hospitalized 2016.   . Drug use: No  . Sexual activity: Not on file   Other Topics Concern  . 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   Patient with a history of smoking half pack a day for 35+ years. Patient currently only smoking a few cigarettes per day.   The patient has  history of alcohol abuse. Patient quit in 2000 and then relapsed in 2016. Patient has not had any alcohol since 2016.    FAMILY HISTORY: Family History  Problem Relation Age of Onset  . Hypertension Mother   . Colon cancer Neg Hx     REVIEW OF SYSTEMS: Reviewed with the patient as per history of present illness.  Psych: Patient has a history of anxiety. Patient has a history of dental phobia.   DENTAL HISTORY: CHIEF COMPLAINT: Patient initially referred by Dr. Isidore Moos for a dental consultation prior to radiation therapy and was seen on 01/06/2017. The patient now presents for a pre-radiation therapy follow-up examination after surgical resection of the cancer with reconstructive surgery at [facility] on 03/01/2017.  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  The patient had been having discomfort on the left side of his mouth. Patient saw his primary dentist , Dr. Abel Presto in April 2018 and was then referred to an oral surgeon, Dr. Otilio Carpen.  Dr. Otilio Carpen then performed a biopsy. Patient was diagnosed with squamous cell carcinoma of the retromolar trigone at that time. Patient proceeded with surgical resection at [facility] on 03/01/17 with anticipated postoperative radiation therapy with Dr. Isidore Moos. Dr. Isidore Moos is to see the patient later this afternoon.  The patient currently denies having any toothaches, swellings, or abscesses.  Patient is having some difficulty with trismus symptoms but has been doing exercises according to directions from the [facility] physicians.     DENTAL EXAMINATION: GENERAL: Patient is a well-developed, well-nourished male in no acute distress. HEAD AND NECK: The Left face and neck are consistent with left mandibulectomy, neck dissection, scapular flap reconstruction.  Patient has decreased maximum interincisal opening  previously measured at 15 mm but is now 27 mm from incisal edge to incisal edge. Patient does have trismus symptoms associated with opening and closing of his mouth. INTRAORAL EXAM: Patient has incipient xerostomia. The left retromolar trigone and left maxilla are  consistent with previous surgical resection and reconstructive surgery. There is evidence of maxillary and mandibular incisal and occlusal attrition as before. DENTITION: Patient is missing tooth numbers 1 through 5, 12 -16, 17-20, and 32. Left partial mandibulectomy is noted.  PERIODONTAL: Patient has chronic periodontitis with minimal plaque accumulations, selective areas of gingival recession and no significant tooth mobility. DENTAL CARIES/SUBOPTIMAL RESTORATIONS: No obvious dental caries are noted. ENDODONTIC: Patient currently denies acute pulpitis symptoms. CROWN AND BRIDGE: There are no crown or bridge restorations. PROSTHODONTIC: The patient denies having any partial dentures. OCCLUSION: Patient has a poor occlusal scheme secondary to multiple missing teeth, occlusal and incisal attrition, and lack of replacement of missing teeth with dental prostheses.  RADIOGRAPHIC INTERPRETATION: An orthopantogram was taken today. There are multiple missing teeth. The patient has had a left partial mandibulectomy, left palatal resection, and reconstructive surgery with plating involving the left mandible and remaining portion of the coronoid process and ramus. Multiple surgical clips are noted in the surgical field. There is supra-eruption and drifting of the unopposed teeth into the edentulous areas. Multiple dental restorations are noted.  There is incipient to moderate bone loss noted. There is evidence of incisal and occlusal attrition.  ASSESSMENTS: 1. Squamous cell carcinoma of the left retromolar trigone area 2. 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  3. Preradiation therapy dental protocol  4. Trismus with decreased maximum interincisal opening now measured at 27 mm 5. Chronic periodontitis with bone loss 6. Accretions-minimal  7. Selective areas of gingival recession 8. Multiple missing teeth 9. Maxillary and mandibular incisal and occlusal attrition 10. No history of partial dentures 11. Poor occlusal scheme but a stable occlusion   PLAN/RECOMMENDATIONS: 1. I discussed the risks, benefits, and complications of various treatment options with the patient in relationship to his medical and dental conditions, postoperative radiation therapy, and radiation therapy side effects to include xerostomia, radiation caries, trismus, mucositis, taste changes, gum and jawbone changes, and risk for osteoradionecrosis.  We discussed various treatment options to include no treatment, extraction of remaining teeth with alveoloplasty, pre-prosthetic surgery as indicated, periodontal therapy, dental restorations, root canal therapy, crown and bridge therapy, implant therapy, and replacement of missing teeth as indicated. The patient currently does not wish to proceed with any dental extractions at this time. The patient is aware of the need for oral hygiene, continued use of fluoride therapy, and follow-up with the dentist of his choice for continued periodontal maintenance procedures. Patient also is aware of the need for follow-up with a prosthodontist for prosthodontic rehabilitation after the radiation therapy has been complete if he so desires this type of prosthodontics treatment. The patient is currently cleared for radiation therapy.  2. Discussion of findings with medical team and coordination of future medical and dental care as needed.   Lenn Cal, DDS

## 2017-04-28 ENCOUNTER — Encounter: Payer: Self-pay | Admitting: *Deleted

## 2017-04-28 NOTE — Progress Notes (Addendum)
A user error has taken place: encounter opened in error, closed for administrative reasons.

## 2017-04-28 NOTE — Progress Notes (Signed)
Oncology Nurse Navigator Documentation  Met with Colin Wagner and friend Colin Wagner during f/u consult with Dr. Isidore Moos to discuss RT s/p his 03/01/17 surgery at [facility]. He reported:'  Eating soft/pureed food, instilling 6 Glucerna 1.5/d via PEG.  Stable weight at 160 lbs.  Follow-up appt with Dr. Vertell Limber, [facility], next week. He voiced understanding proposed RT of 30 fxt. I reviewed with him radiation planning procedure, he indicated preference for open-face mask d/t claustrophobia. He voiced understanding I will arrange for him to attend the 9/25 H&N MDC to be seen by Nutrition, PT and SW. He was encouraged to call me with questions/concerns.  [name], RN, BSN, [place] Neck Oncology Nurse [place] at [place] [phone]

## 2017-04-29 ENCOUNTER — Other Ambulatory Visit: Payer: Self-pay | Admitting: Radiation Oncology

## 2017-04-29 ENCOUNTER — Telehealth: Payer: Self-pay | Admitting: *Deleted

## 2017-04-29 DIAGNOSIS — C069 Malignant neoplasm of mouth, unspecified: Secondary | ICD-10-CM

## 2017-04-29 DIAGNOSIS — Z1329 Encounter for screening for other suspected endocrine disorder: Secondary | ICD-10-CM

## 2017-04-29 NOTE — Telephone Encounter (Signed)
Oncology Nurse Navigator Documentation  Received returned call from Shirlee Latch, [facility] SLP.  She indicated Mr. Folkert's certification period expires next week, inquired if he would better benefit from [facility] SLP services as he has RT as SLP for patients receiving RT is not an area of her expertise.  I concurred transfer of SLP would be in his best interest.  She stated she has appt with him next week Wednesday, will propose change of services, will call me to confirm.  [name], RN, BSN, [place] Neck Oncology Nurse [place] at [place] [phone]

## 2017-05-02 NOTE — Progress Notes (Signed)
Has armband been applied?  Yes  Does patient have an allergy to IV contrast dye?: No.   Has patient ever received premedication for IV contrast dye?: No.   Does patient take metformin?: No.  If patient does take metformin when was the last dose:  N/A  Date of lab work: 04/25/17 BUN: N/A CR: 1.09 EGFR: > 60  IV site: Right wrist/forearm area  Has IV site been added to flowsheet?  Yes

## 2017-05-03 ENCOUNTER — Telehealth: Payer: Self-pay | Admitting: *Deleted

## 2017-05-03 NOTE — Telephone Encounter (Signed)
CALLED PATIENT TO REMIND OF LAB FOR 05-04-17 @ 1 PM, LVM FOR A RETURN CALL

## 2017-05-04 ENCOUNTER — Encounter: Payer: Self-pay | Admitting: *Deleted

## 2017-05-04 ENCOUNTER — Ambulatory Visit
Admission: RE | Admit: 2017-05-04 | Discharge: 2017-05-04 | Disposition: A | Discharge status: Home / Self Care | Payer: Medicare Other | Source: Ambulatory Visit | Attending: Radiation Oncology | Admitting: Radiation Oncology

## 2017-05-04 ENCOUNTER — Telehealth: Payer: Self-pay | Admitting: *Deleted

## 2017-05-04 VITALS — BP 97/69 | HR 86 | Ht 71.0 in | Wt 162.8 lb

## 2017-05-04 DIAGNOSIS — C411 Malignant neoplasm of mandible: Secondary | ICD-10-CM

## 2017-05-04 DIAGNOSIS — C060 Malignant neoplasm of cheek mucosa: Secondary | ICD-10-CM | POA: Diagnosis not present

## 2017-05-04 DIAGNOSIS — Z1329 Encounter for screening for other suspected endocrine disorder: Secondary | ICD-10-CM

## 2017-05-04 LAB — TSH: TSH: 1.414 m(IU)/L (ref 0.320–4.118)

## 2017-05-04 MED ORDER — SODIUM CHLORIDE 0.9% FLUSH
10.0000 mL | Freq: Once | INTRAVENOUS | Status: AC
  Administered 2017-05-04: 10 mL via INTRAVENOUS

## 2017-05-04 NOTE — Progress Notes (Signed)
Head and Neck Cancer Simulation, IMRT treatment planning note   Outpatient  Diagnosis:    ICD-10-CM   1. Carcinoma of buccal mucosa ([place]) C06.0     The patient was taken to the CT simulator and laid in the supine position on the table. An Aquaplast head and shoulder mask was custom fitted to the patient's anatomy. High-resolution CT axial imaging was obtained of the head and neck with contrast. I verified that the quality of the imaging is good for treatment planning. 1 Medically Necessary Treatment Device was fabricated and supervised by me: Aquaplast mask.   Treatment planning note I plan to treat the patient with IMRT. I plan to treat the patient's tumor bed and bilateral neck nodes. I plan to treat to a total dose of 60 Gray in 30  fractions. Dose calculation was ordered from dosimetry.  IMRT planning Note  IMRT is medically necessary and an important modality to deliver adequate dose to the patient's at risk tissues while sparing the patient's normal structures, including the: esophagus, parotid tissue, mandible, brain stem, spinal cord, oral cavity, brachial plexus.  This justifies the use of IMRT in the patient's treatment.   -----------------------------------  [name], MD

## 2017-05-04 NOTE — Telephone Encounter (Signed)
Oncology Nurse Navigator Documentation  Received follow-up call from [place] Shirlee Latch [phone]).  She indicated patient's PCP wishes home care services to continue for PEG, Foley catheter care; as a result, he cannot receive out-patient SLP services while receiving RT at [facility].  I encouraged her to call [place] for guidance re support she can provide patient, provided Carl's phone number.  I updated Glendell Docker re patient disposition.  [name], RN, BSN, [place] Neck Oncology Nurse [place] at [place] [phone]

## 2017-05-04 NOTE — Progress Notes (Signed)
Oncology Nurse Navigator Documentation  To provide support, encouragement and care continuity, met with Colin Wagner during CT SIM.  He was accompanied by friend [place]. He was fitted with open-face mask d/t previously reported claustrophobia.   He tolerated procedure without difficulty. I provided tour of LINAC 2 treatment area, explained procedure for arriving to treatment from Radiation Waiting, preparing for tmt.  They voiced understanding of information provided.  [name], RN, BSN, [place] Neck Oncology Nurse [place] at [place] [phone]

## 2017-05-08 ENCOUNTER — Emergency Department (HOSPITAL_COMMUNITY)
Admission: EM | Admit: 2017-05-08 | Discharge: 2017-05-08 | Disposition: A | Discharge status: Home / Self Care | Payer: Medicare Other | Attending: Emergency Medicine | Admitting: Emergency Medicine

## 2017-05-08 ENCOUNTER — Encounter (HOSPITAL_COMMUNITY): Payer: Self-pay

## 2017-05-08 DIAGNOSIS — J449 Chronic obstructive pulmonary disease, unspecified: Secondary | ICD-10-CM | POA: Insufficient documentation

## 2017-05-08 DIAGNOSIS — I10 Essential (primary) hypertension: Secondary | ICD-10-CM | POA: Insufficient documentation

## 2017-05-08 DIAGNOSIS — Z85528 Personal history of other malignant neoplasm of kidney: Secondary | ICD-10-CM | POA: Insufficient documentation

## 2017-05-08 DIAGNOSIS — Y658 Other specified misadventures during surgical and medical care: Secondary | ICD-10-CM | POA: Insufficient documentation

## 2017-05-08 DIAGNOSIS — K9423 Gastrostomy malfunction: Secondary | ICD-10-CM | POA: Diagnosis present

## 2017-05-08 DIAGNOSIS — T85598A Other mechanical complication of other gastrointestinal prosthetic devices, implants and grafts, initial encounter: Secondary | ICD-10-CM | POA: Insufficient documentation

## 2017-05-08 DIAGNOSIS — E119 Type 2 diabetes mellitus without complications: Secondary | ICD-10-CM | POA: Diagnosis not present

## 2017-05-08 DIAGNOSIS — Z87891 Personal history of nicotine dependence: Secondary | ICD-10-CM | POA: Diagnosis not present

## 2017-05-08 DIAGNOSIS — Z79899 Other long term (current) drug therapy: Secondary | ICD-10-CM | POA: Diagnosis not present

## 2017-05-08 DIAGNOSIS — Z794 Long term (current) use of insulin: Secondary | ICD-10-CM | POA: Diagnosis not present

## 2017-05-08 NOTE — Discharge Instructions (Signed)
Contact your md and let them know you have a new tube.  Follow up as needed

## 2017-05-08 NOTE — ED Triage Notes (Signed)
Here for feeding tube clogged since around lunch. Girlfriend attempted warm water and "soda" to unclog and was not successful.

## 2017-05-08 NOTE — ED Provider Notes (Signed)
[facility] DEPT Provider Note   CSN: [id number] Arrival date & time: 05/08/17  1533     History   Chief Complaint Chief Complaint  Patient presents with  . Feeding Tube Clogged    HPI Colin Wagner is a 66 y.o. male.  Patient had G-tube placed 3-4 months ago.   It is clogged and will not work.   The history is provided by a relative and the patient. No language interpreter was used.  Illness  This is a new problem. The current episode started 6 to 12 hours ago. The problem occurs constantly. The problem has not changed since onset.Pertinent negatives include no chest pain, no abdominal pain and no headaches. Nothing aggravates the symptoms. Nothing relieves the symptoms.    Past Medical History:  Diagnosis Date  . Alcohol abuse   . Alcoholic pancreatitis 3664   admission  . Chronic back pain   . Chronic pancreatitis ([place])    based on ct findings 2016  . COPD (chronic obstructive pulmonary disease) ([place])   . Diabetes mellitus   . Diverticulosis   . Gastritis   . GERD (gastroesophageal reflux disease)   . Headache   . Hyperlipidemia   . Hypertension   . Peptic ulcer disease 1999   Per medical reports, no H pylori  . Renal cancer, left ([place]) 2012   he tells me that he has been released, ?and that he is free of cancer, and never had it to begin with.   . Right shoulder pain   . Testicular hypofunction     Patient Active Problem List   Diagnosis Date Noted  . UTI (urinary tract infection) 04/20/2017  . UTI due to Klebsiella species 04/20/2017  . Essential hypertension 04/20/2017  . Chronic pancreatitis ([place]) 04/20/2017  . 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 Malnutrition of moderate degree 04/20/2017  . Complicated UTI (urinary tract infection)   . 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 Rectal bleeding 09/28/2016  . Hyponatremia 04/28/2015  . Abdominal pain 04/27/2015  . Acute pancreatitis 04/27/2015  . 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 ETOH abuse 04/27/2015  . DM type 2 (diabetes mellitus, type 2) ([place]) 04/27/2015  . Hypertension, uncontrolled 04/27/2015  . Hyperlipidemia 04/27/2015  . Hypertensive urgency 04/27/2015  . Alcohol abuse     Past Surgical History:  Procedure Laterality Date  . COLONOSCOPY  2003   Dr. [name], polyps  . COLONOSCOPY  2005   Dr. [name], multiple diverticula  . COLONOSCOPY  2008   Dr. [name], diverticulosis  . COLONOSCOPY WITH PROPOFOL N/A 10/21/2016   Procedure: COLONOSCOPY WITH PROPOFOL;  Surgeon: [name], MD;  [facility];  Service: Endoscopy;  Laterality: N/A;  8:30 am  . ESOPHAGOGASTRODUODENOSCOPY     Multiple EGDs. 1999 EGD showed gastric ulcers, no H pylori and benign biopsies performed by Dr. [name]. 2001 gastric ulcer healed. Last EGD 2005 had gastritis.  . Left partial mandibulectomy, Scapular free flap reconstruction, selective neck dissection, tracheotomy, and resection of intraoral palate cancer. Left 03/01/2017   [facility]  . LUNG BIOPSY    . PARTIAL NEPHRECTOMY Left 2012  . POLYPECTOMY  10/21/2016   Procedure: POLYPECTOMY;  Surgeon: [name], MD;  Location: [facility];  Service: Endoscopy;;  hepatic flexure x2       Home Medications    Prior to Admission medications   Medication Sig Start Date End Date Taking?  Authorizing Provider  Acetaminophen (TYLENOL 8 HOUR PO) 2 tablets by Feeding Tube route 2 (two) times daily as needed.     [provider]  albuterol (PROVENTIL HFA;VENTOLIN HFA) 108 (90 Base) MCG/ACT inhaler Inhale 1-2 puffs into the lungs every 6 (six) hours as needed for wheezing or shortness of breath. 09/25/16   Nat Christen, MD  atorvastatin (LIPITOR) 40 MG tablet  02/14/17   [provider]  B-D UF III MINI PEN NEEDLES 31G X 5 MM MISC  03/25/17   [provider]  B-D ULTRAFINE III SHORT PEN 31G X 8 MM MISC  03/27/17   [provider]  cetirizine (ZYRTEC) 10 MG tablet Place 10 mg into feeding tube at bedtime.  03/31/15   [provider]  fluconazole (DIFLUCAN) 100 MG tablet  04/15/17   [provider]  HYDROcodone-acetaminophen (NORCO) 7.5-325 MG tablet Take 1 tablet by mouth every 6 (six) hours as needed (pain). Patient taking differently: 1 tablet every 6 (six) hours as needed (pain). Per feeding tube 01/12/17   [name], MD  imipenem-cilastatin (PRIMAXIN) IVPB Inject 500 mg into the vein every 8 (eight) hours. Indication:  MDR UTI Last Day of Therapy:  04/26/17 04/22/17   Orson Eva, MD  insulin aspart (NOVOLOG) 100 UNIT/ML injection Inject 0-9 Units into the skin every 4 (four) hours. 70-120--No units; 121 to 150--1 unit; 151 to 200--2 units; 201 to 250--3 units; 251 to 300--5 units; 301 to 350--7 units; 351 to 400--9 units; >400--call MD 04/22/17   Orson Eva, MD  LANTUS 100 UNIT/ML injection Inject 25 Units into the skin every evening.  03/09/17   [provider]  lisinopril (PRINIVIL,ZESTRIL) 5 MG tablet  04/06/17   [provider]  LORazepam (ATIVAN) 0.5 MG tablet  01/24/17   [provider]  LORazepam (ATIVAN) 1 MG tablet Take 1 mg by mouth 4 (four) times daily as needed for anxiety. For anxiety 03/31/15   [provider]  pantoprazole (PROTONIX) 20 MG tablet 20 mg daily. Per feeding tube    [provider]  senna (SENOKOT) 8.6 MG TABS tablet Take 1 tablet (8.6 mg total) by mouth at bedtime as needed for mild constipation. May be needed as pain medication can constipate. Patient taking differently: Place 1 tablet into feeding tube at bedtime as needed for mild constipation. May be needed as pain medication can constipate. 01/12/17   [name], MD  sodium chloride 0.9 % infusion  04/22/17   [provider]  terazosin (HYTRIN) 1 MG capsule Place 1 capsule into feeding tube 2 (two) times daily.  03/30/17   [provider]  testosterone cypionate  (DEPOTESTOSTERONE CYPIONATE) 200 MG/ML injection  02/04/17   [provider]  traZODone (DESYREL) 50 MG tablet Take 50 mg by mouth at bedtime.    [provider]  Water For Irrigation, Sterile (FREE WATER) SOLN Place 100 mLs into feeding tube every 8 (eight) hours. 04/22/17   Orson Eva, MD    Family History Family History  Problem Relation Age of Onset  . Hypertension Mother   . Colon cancer Neg Hx     Social History Social History  Substance Use Topics  . Smoking status: Former Smoker    Packs/day: 0.50    Years: 35.00  . Smokeless tobacco: Never Used     Comment: Only smoking a few cigarettes now  . Alcohol use No     Comment: quit 2000 but relapse in 2016. no etoh since  hospitalized 2016.      Allergies   Patient has no known allergies.   Review of Systems Review of Systems  Constitutional: Negative for appetite change and fatigue.  HENT: Negative for congestion, ear discharge and sinus pressure.   Eyes: Negative for discharge.  Respiratory: Negative for cough.   Cardiovascular: Negative for chest pain.  Gastrointestinal: Negative for abdominal pain and diarrhea.  Genitourinary: Negative for frequency and hematuria.  Musculoskeletal: Negative for back pain.  Skin: Negative for rash.  Neurological: Negative for seizures and headaches.  Psychiatric/Behavioral: Negative for hallucinations.     Physical Exam Updated Vital Signs BP 128/71 (BP Location: Right Arm)   Pulse 89   Temp (!) 97.3 F (36.3 C) (Temporal)   Resp 18   Ht 5\' 11"  (1.803 m)   Wt 73.5 kg (162 lb)   SpO2 98%   BMI 22.59 kg/m   Physical Exam  Constitutional: He is oriented to person, place, and time. He appears well-developed.  HENT:  Head: Normocephalic.  Eyes: Conjunctivae are normal.  Neck: No tracheal deviation present.  Cardiovascular:  No murmur heard. Abdominal: Soft.  g- tube in place  Neurological: He is oriented to person, place, and time.  Skin: Skin is  warm.  Psychiatric: He has a normal mood and affect.     ED Treatments / Results  Labs (all labs ordered are listed, but only abnormal results are displayed) Labs Reviewed - No data to display  EKG  EKG Interpretation None       Radiology No results found.  Procedures Gastrostomy tube replacement Date/Time: 05/08/2017 5:16 PM Performed by: Milton Ferguson Authorized by: Milton Ferguson  Consent: Verbal consent obtained. Written consent not obtained. Risks and benefits: risks, benefits and alternatives were discussed Consent given by: patient Patient understanding: patient states understanding of the procedure being performed Patient consent: the patient's understanding of the procedure matches consent given Procedure consent: procedure consent matches procedure scheduled Relevant documents: relevant documents not present or verified Test results: test results not available Site marked: the operative site was marked Imaging studies: imaging studies not available Patient identity confirmed: verbally with patient Time out: Immediately prior to procedure a "time out" was called to verify the correct patient, procedure, equipment, support staff and site/side marked as required. Preparation: Patient was prepped and draped in the usual sterile fashion. Local anesthesia used: no  Anesthesia: Local anesthesia used: no  Sedation: Patient sedated: no Patient tolerance: Patient tolerated the procedure well with no immediate complications Comments: Patient had G-tube placed without problems    (including critical care time)  Medications Ordered in ED Medications - No data to display   Initial Impression / Assessment and Plan / ED Course  I have reviewed the triage vital signs and the nursing notes.  Pertinent labs & imaging results that were available during my care of the patient were reviewed by me and considered in my medical decision making (see chart for details).       G-tube placed without problems  Final Clinical Impressions(s) / ED Diagnoses   Final diagnoses:  Impaired oral gastric feeding tube, initial encounter    New Prescriptions New Prescriptions   No medications on file     Milton Ferguson, MD 05/08/17 1720

## 2017-05-09 DIAGNOSIS — C060 Malignant neoplasm of cheek mucosa: Secondary | ICD-10-CM | POA: Diagnosis not present

## 2017-05-12 ENCOUNTER — Ambulatory Visit: Payer: Medicare Other

## 2017-05-12 ENCOUNTER — Encounter: Payer: Self-pay | Admitting: *Deleted

## 2017-05-12 ENCOUNTER — Ambulatory Visit
Admission: RE | Admit: 2017-05-12 | Discharge: 2017-05-12 | Disposition: A | Discharge status: Home / Self Care | Payer: Medicare Other | Source: Ambulatory Visit | Attending: Radiation Oncology | Admitting: Radiation Oncology

## 2017-05-12 DIAGNOSIS — C060 Malignant neoplasm of cheek mucosa: Secondary | ICD-10-CM | POA: Diagnosis not present

## 2017-05-13 ENCOUNTER — Ambulatory Visit
Admission: RE | Admit: 2017-05-13 | Discharge: 2017-05-13 | Disposition: A | Discharge status: Home / Self Care | Payer: Medicare Other | Source: Ambulatory Visit | Attending: Radiation Oncology | Admitting: Radiation Oncology

## 2017-05-13 DIAGNOSIS — C060 Malignant neoplasm of cheek mucosa: Secondary | ICD-10-CM | POA: Diagnosis not present

## 2017-05-15 NOTE — Progress Notes (Signed)
Oncology Nurse Navigator Documentation  To provide support, encouragement and care continuity, met with Mr. Ruggiero during [facility].  He was accompanied by his companion. He tolerated procedure w/o difficulty. We discussed his attendance next Tuesday morning at [facility] to meet with Nutrition, PT, SW and Financial Counseling.  He confirmed availability.  I provided him 0845 arrival to Radiation Waiting following lobby registration, explained registration procedure.  He voiced understanding. I encouraged him to call me with needs/concerns.  [name], RN, BSN, [place] Ne[facility] Oncology Nurse [place] at [place] [phone]

## 2017-05-16 ENCOUNTER — Other Ambulatory Visit: Payer: Self-pay | Admitting: Radiation Oncology

## 2017-05-16 ENCOUNTER — Ambulatory Visit
Admission: RE | Admit: 2017-05-16 | Discharge: 2017-05-16 | Disposition: A | Discharge status: Home / Self Care | Payer: Medicare Other | Source: Ambulatory Visit | Attending: Radiation Oncology | Admitting: Radiation Oncology

## 2017-05-16 ENCOUNTER — Encounter: Payer: Self-pay | Admitting: *Deleted

## 2017-05-16 DIAGNOSIS — C060 Malignant neoplasm of cheek mucosa: Secondary | ICD-10-CM

## 2017-05-16 DIAGNOSIS — C411 Malignant neoplasm of mandible: Secondary | ICD-10-CM

## 2017-05-16 MED ORDER — LIDOCAINE VISCOUS 2 % MT SOLN
OROMUCOSAL | 5 refills | Status: DC
Start: 2017-05-16 — End: 2017-08-23

## 2017-05-16 MED ORDER — BIAFINE EX EMUL
Freq: Two times a day (BID) | CUTANEOUS | Status: DC
  Administered 2017-05-16: 14:00:00 via TOPICAL

## 2017-05-16 NOTE — Progress Notes (Signed)

## 2017-05-16 NOTE — Progress Notes (Signed)
Oncology Nurse Navigator Documentation  Spoke with Colin Wagner and his companion in Radiation Waiting prior to his RT appt, confirmed his arrival tomorrow 0845 to Radiation Waiting for [facility] following lobby registration.  [name], RN, BSN, [place] [facility] Oncology Nurse [place] at [place] [phone]

## 2017-05-17 ENCOUNTER — Encounter: Payer: Self-pay | Admitting: *Deleted

## 2017-05-17 ENCOUNTER — Ambulatory Visit
Admission: RE | Admit: 2017-05-17 | Discharge: 2017-05-17 | Disposition: A | Discharge status: Home / Self Care | Payer: Medicare Other | Source: Ambulatory Visit | Attending: Radiation Oncology | Admitting: Radiation Oncology

## 2017-05-17 ENCOUNTER — Ambulatory Visit: Payer: Medicare Other | Admitting: Nutrition

## 2017-05-17 ENCOUNTER — Ambulatory Visit: Payer: Medicare Other | Attending: Radiation Oncology | Admitting: Physical Therapy

## 2017-05-17 VITALS — Ht 71.0 in | Wt 171.4 lb

## 2017-05-17 DIAGNOSIS — C060 Malignant neoplasm of cheek mucosa: Secondary | ICD-10-CM | POA: Diagnosis not present

## 2017-05-17 DIAGNOSIS — M25612 Stiffness of left shoulder, not elsewhere classified: Secondary | ICD-10-CM | POA: Insufficient documentation

## 2017-05-17 DIAGNOSIS — R600 Localized edema: Secondary | ICD-10-CM | POA: Diagnosis present

## 2017-05-17 DIAGNOSIS — Z483 Aftercare following surgery for neoplasm: Secondary | ICD-10-CM | POA: Insufficient documentation

## 2017-05-17 DIAGNOSIS — C411 Malignant neoplasm of mandible: Secondary | ICD-10-CM

## 2017-05-17 NOTE — Therapy (Signed)
[place], [place], [address] Phone: [phone]   Fax:  [phone]  Physical Therapy Evaluation  Patient Details  Name: Colin Wagner MRN: [id number] Date of Birth: 1951/04/27 Referring Provider: Dr. [name]  Encounter Date: 05/17/2017      PT End of Session - 05/17/17 1028    Visit Number 1   Number of Visits 1   PT Start Time 0905   PT Stop Time 0943   PT Time Calculation (min) 38 min   Activity Tolerance Patient tolerated treatment well   Behavior During Therapy [facility] for tasks assessed/performed      Past Medical History:  Diagnosis Date  . Alcohol abuse   . Alcoholic pancreatitis 2993   admission  . Chronic back pain   . Chronic pancreatitis ([place])    based on ct findings 2016  . COPD (chronic obstructive pulmonary disease) ([place])   . Diabetes mellitus   . Diverticulosis   . Gastritis   . GERD (gastroesophageal reflux disease)   . Headache   . Hyperlipidemia   . Hypertension   . Peptic ulcer disease 1999   Per medical reports, no H pylori  . Renal cancer, left ([place]) 2012   he tells me that he has been released, ?and that he is free of cancer, and never had it to begin with.   . Right shoulder pain   . Testicular hypofunction     Past Surgical History:  Procedure Laterality Date  . COLONOSCOPY  2003   Dr. [name], polyps  . COLONOSCOPY  2005   Dr. [name], multiple diverticula  . COLONOSCOPY  2008   Dr. [name], diverticulosis  . COLONOSCOPY WITH PROPOFOL N/A 10/21/2016   Procedure: COLONOSCOPY WITH PROPOFOL;  Surgeon: [name], MD;  [facility];  Service: Endoscopy;  Laterality: N/A;  8:30 am  . ESOPHAGOGASTRODUODENOSCOPY     Multiple EGDs. 1999 EGD showed gastric ulcers, no H pylori and benign biopsies performed by Dr. [name]. 2001 gastric ulcer healed. Last EGD 2005 had gastritis.  . Left partial mandibulectomy, Scapular free flap  reconstruction, selective neck dissection, tracheotomy, and resection of intraoral palate cancer. Left 03/01/2017   [facility]  . LUNG BIOPSY    . PARTIAL NEPHRECTOMY Left 2012  . POLYPECTOMY  10/21/2016   Procedure: POLYPECTOMY;  Surgeon: [name], MD;  Location[facility];  Service: Endoscopy;;  hepatic flexure x2    There were no vitals filed for this visit.       Subjective Assessment - 05/17/17 1015    Subjective Has gotten shoulder exercises from OT that he is continuing to do at home. Is getting speech therapy at home for swallowing.   Patient is accompained by: Family member  significant other   Pertinent History Diagnosis of buccal mucosa squamous cell carcinoma, Lt. retromolar trigone region.  Surgery 03/01/17 at [facility] with resection of Lt. mandible, maxilla, buccal mucosa, pharynx.  [facility] RT started 05/12/17.  COPD, DM, HTN, chronic back pain, 2012 h/o renal cancer.   Patient Stated Goals get info from head & neck clinic providers   Currently in Pain? Yes   Pain Score 5    Pain Location Face  and neck   Pain Orientation Left   Pain Descriptors / Indicators Aching;Sore   Pain Type Surgical pain   Pain Onset More than a month ago   Aggravating Factors  doing swallowing  exercises   Pain Relieving Factors stop exercise, tylenol, pain meds            [facility] PT Assessment - 05/17/17 0001      Assessment   Medical Diagnosis head & neck cancer s/psurgical resection and reconstruction   Referring Provider Dr. [name]   Onset Date/Surgical Date 03/01/17  surgical resection/reconstruction   Hand Dominance Right   Prior Therapy getting home health speech therapy; had OT for his left shoulder     Precautions   Precautions Other (comment)   Precaution Comments cancer precautions     Restrictions   Weight Bearing Restrictions No     Balance Screen   Has the patient fallen in the past 6 months No   Has the patient  had a decrease in activity level because of a fear of falling?  No   Is the patient reluctant to leave their home because of a fear of falling?  No     Home Environment   Living Environment Private residence   Type of [place] to enter   Entrance Stairs-Number of Steps 2   Entrance Stairs-Rails --  yes   [place] One level     Prior Function   Level of Independence Independent   Leisure doew walking inside the house, perhaps an hour a day     Cognition   Overall Cognitive Status Within Functional Limits for tasks assessed     Observation/Other Assessments   Observations gentleman with swollen left face from surgery, slightly difficult to understand   Skin Integrity healed incision at mid anterior neck running lateral and then up to left ear tragus area     Coordination   Gross Motor Movements are Fluid and Coordinated Yes     Functional Tests   Functional tests Sit to Stand     Sit to Stand   Comments 11 times in 30 seconds, just below average for age  mild dyspnea following     Posture/Postural Control   Posture/Postural Control Postural limitations   Postural Limitations Forward head;Rounded Shoulders     ROM / Strength   AROM / PROM / Strength AROM     AROM   Overall AROM Comments Right shoulder AROM WFL; left significantly limited: approx. 70 degrees of flexion and 50 degrees of abduction  girlfriend says it's because of graft donor site left scapul   AROM Assessment Site Cervical   Cervical Flexion 10% loss   Cervical Extension 20% loss  pulls anterior neck   Cervical - Right Side Bend 75% loss   Cervical - Left Side Bend 50% loss   Cervical - Right Rotation 25% loss   Cervical - Left Rotation 50% loss     Palpation   Palpation comment firm, fibrotic tissue at left neck around incision area     Ambulation/Gait   Ambulation/Gait Yes   Ambulation/Gait Assistance 7: Independent  per his report           LYMPHEDEMA/ONCOLOGY  QUESTIONNAIRE - 05/17/17 1037      Type   Cancer Type buccal mucosa squamous cell     Lymphedema Assessments   Lymphedema Assessments Head and Neck     Head and Neck   Right Corner of mouth to where ear lobe meets face 9.5 cm   Left Corner of mouth to where ear lobe meets face 12 cm   4 cm superior to sternal notch around neck 37.8 cm  6 cm superior to sternal notch around neck 38 cm   8 cm superior to sternal notch around neck 39.5 cm         Objective measurements completed on examination: See above findings.                  PT Education - 05/17/17 1027    Education provided Yes   Education Details neck ROM, posture, breathing, walking, CURE article on staying active through treatment, "Why exercise?" flyer, lymphedema and PT info   Person(s) Educated Patient;Other (comment)  significant other   Methods Explanation;Handout   Comprehension Verbalized understanding                 Head and Neck Clinic Goals - 05/17/17 1034      Patient will be able to verbalize understanding of a home exercise program for cervical range of motion, posture, and walking.    Status Achieved     Patient will be able to verbalize understanding of proper sitting and standing posture.    Status Achieved     Patient will be able to verbalize understanding of lymphedema risk and availability of treatment for this condition.    Status Achieved            Plan - 05/17/17 1028    Clinical Impression Statement Pleasant gentleman with diagnosis of squamous cell carcinoma of buccal mucosa who has undergone resection and reconstruction surgery 03/01/17.  He started XRT 05/12/17.  He has swelling on left face from surgery and a well-healed scar with decreased soft tissue mobility at left neck.  He has limited neck ROM, limited left shoulder AROM, poor posture, and is at risk for lymphedema both because he had lymph node dissection and will have XRT.   History  and Personal Factors relevant to plan of care: reconstruction surgery with sequelae of tight tissue and swelling, COPD, DM, chronic back pain   Clinical Presentation Evolving   Clinical Presentation due to: still healing from resection/reconstruction surgery and just starting XRT   Clinical Decision Making Moderate   Rehab Potential Good   PT Frequency One time visit   PT Treatment/Interventions Patient/family education   PT Next Visit Plan None planned currently.  He may need therapy going forward for swelling and soft tissue tightness.   PT Home Exercise Plan neck ROM, posture, walking   Consulted and Agree with Plan of Care Patient      Patient will benefit from skilled therapeutic intervention in order to improve the following deficits and impairments:  Decreased range of motion, Decreased scar mobility, Increased edema, Decreased knowledge of precautions  Visit Diagnosis: Aftercare following surgery for neoplasm - Plan: PT plan of care cert/re-cert  Localized edema - Plan: PT plan of care cert/re-cert  Stiffness of left shoulder, not elsewhere classified - Plan: PT plan of care cert/re-cert      G-Codes - 46/96/29 1034    Functional Assessment Tool Used (Outpatient Only) clinical judgement   Functional Limitation Changing and maintaining body position  re: neck ROM   Changing and Maintaining Body Position Current Status (B2841) At least 40 percent but less than 60 percent impaired, limited or restricted   Changing and Maintaining Body Position Goal Status (L2440) At least 40 percent but less than 60 percent impaired, limited or restricted   Changing and Maintaining Body Position Discharge Status (N0272) At least 40 percent but less than 60 percent impaired, limited or restricted       Problem  List Patient Active Problem List   Diagnosis Date Noted  . UTI (urinary tract infection) 04/20/2017  . UTI due to Klebsiella species 04/20/2017  . Essential hypertension 04/20/2017   . Chronic pancreatitis ([place]) 04/20/2017  . 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 Malnutrition of moderate degree 04/20/2017  . Complicated UTI (urinary tract infection)   . 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 Rectal bleeding 09/28/2016  . Hyponatremia 04/28/2015  . Abdominal pain 04/27/2015  . Acute pancreatitis 04/27/2015  . Chronic alcoholic pancreatitis ([place]) 04/27/2015  . ETOH abuse 04/27/2015  . DM type 2 (diabetes mellitus, type 2) ([place]) 04/27/2015  . Hypertension, uncontrolled 04/27/2015  . Hyperlipidemia 04/27/2015  . Hypertensive urgency 04/27/2015  . Alcohol abuse     Kema Santaella 05/17/2017, 10:39 AM  [place], [place], [address] Phone: [phone]   Fax:  [phone]  Name: JAICOB DIA MRN: [id number] Date of Birth: 12-11-1950  Serafina Royals, PT 05/17/17 10:39 AM

## 2017-05-17 NOTE — Progress Notes (Signed)
Patient was seen during head and neck clinic.  66 year old male diagnosed with buccal mucosa cancer.  He is a patient of Dr. Isidore Moos to receive IMRT.  Past medical history includes left renal cell cancer in 2012, PUD,, hypertension, hyperlipidemia, GERD, diabetes, diverticulosis, COPD, chronic pancreatitis, and alcohol.  Medications include NovoLog, Lantus, Ativan, Protonix, Senokot.  Labs include BUN 39, and creatinine 1.09.  Height: 5 feet 11 inches. Weight: 171.4 pounds. Usual body weight: 191 pounds in March 2018 BMI: 23.91.  Patient has a gastrostomy tube which he has been using successfully.  He uses 2 cans Glucerna 1.5 3 times a day with 60 mL free water before and 120 mL free water after bolus feeding. Patient gets an additional 200 mL of water reviewed G-tube. Reports he can tolerate soft foods and water without difficulty. He gets his tube feeding through [facility]. Weight has improved on tube feeding.  Estimated nutrition needs: 2230-2450 calories, 100-120 grams protein, 2.4 L fluid.  Nutrition diagnosis:  Food and nutrition related knowledge deficit related to new diagnosis of buccal mucosa cancer as evidenced by no prior need for nutrition related information.  Intervention: Patient will continue on 6 cans Glucerna 1.5 providing 2136 cal, 118 g protein, 1820 mL free water. I enforced the importance of patient continuing oral intake as tolerated. Reviewed pureing foods and made recommendations. Encouraged patient to continue to drink liquids throughout the day. Questions were answered.  Teach back method used.  Contact information given.  Monitoring, evaluation, goals: Patient will tolerate tube feeding plus oral intake to minimize weight loss throughout treatment.  Next visit: To be scheduled with treatments.  **Disclaimer: This note was dictated with voice recognition software. Similar sounding words can inadvertently be transcribed and this note may  contain transcription errors which may not have been corrected upon publication of note.**

## 2017-05-18 ENCOUNTER — Ambulatory Visit
Admission: RE | Admit: 2017-05-18 | Discharge: 2017-05-18 | Disposition: A | Discharge status: Home / Self Care | Payer: Medicare Other | Source: Ambulatory Visit | Attending: Radiation Oncology | Admitting: Radiation Oncology

## 2017-05-18 DIAGNOSIS — C060 Malignant neoplasm of cheek mucosa: Secondary | ICD-10-CM | POA: Diagnosis not present

## 2017-05-18 NOTE — Progress Notes (Signed)
Oncology Nurse Navigator Documentation  Met with Colin Wagner during [facility] [place] [place].  He was accompanied by companion.  Provided verbal and written overview of [place], the clinicians who will be seeing him, encouraged him to ask questions during his time with them.  He was seen by Nutrition, PT, SW and [place]. Spoke with him at end of [facility], addressed questions.  He proceeded to RT.  [name], RN, BSN, [place] at [place] [phone]

## 2017-05-19 ENCOUNTER — Ambulatory Visit
Admission: RE | Admit: 2017-05-19 | Discharge: 2017-05-19 | Disposition: A | Discharge status: Home / Self Care | Payer: Medicare Other | Source: Ambulatory Visit | Attending: Radiation Oncology | Admitting: Radiation Oncology

## 2017-05-19 ENCOUNTER — Encounter: Payer: Self-pay | Admitting: Nutrition

## 2017-05-19 DIAGNOSIS — C060 Malignant neoplasm of cheek mucosa: Secondary | ICD-10-CM | POA: Diagnosis not present

## 2017-05-20 ENCOUNTER — Ambulatory Visit
Admission: RE | Admit: 2017-05-20 | Discharge: 2017-05-20 | Disposition: A | Discharge status: Home / Self Care | Payer: Medicare Other | Source: Ambulatory Visit | Attending: Radiation Oncology | Admitting: Radiation Oncology

## 2017-05-20 DIAGNOSIS — C060 Malignant neoplasm of cheek mucosa: Secondary | ICD-10-CM | POA: Diagnosis not present

## 2017-05-23 ENCOUNTER — Ambulatory Visit
Admission: RE | Admit: 2017-05-23 | Discharge: 2017-05-23 | Disposition: A | Discharge status: Home / Self Care | Payer: Medicare Other | Source: Ambulatory Visit | Attending: Radiation Oncology | Admitting: Radiation Oncology

## 2017-05-23 DIAGNOSIS — F1011 Alcohol abuse, in remission: Secondary | ICD-10-CM | POA: Diagnosis not present

## 2017-05-23 DIAGNOSIS — J449 Chronic obstructive pulmonary disease, unspecified: Secondary | ICD-10-CM | POA: Diagnosis not present

## 2017-05-23 DIAGNOSIS — K219 Gastro-esophageal reflux disease without esophagitis: Secondary | ICD-10-CM | POA: Diagnosis not present

## 2017-05-23 DIAGNOSIS — F17210 Nicotine dependence, cigarettes, uncomplicated: Secondary | ICD-10-CM | POA: Diagnosis not present

## 2017-05-23 DIAGNOSIS — Z9889 Other specified postprocedural states: Secondary | ICD-10-CM | POA: Diagnosis not present

## 2017-05-23 DIAGNOSIS — E119 Type 2 diabetes mellitus without complications: Secondary | ICD-10-CM | POA: Diagnosis not present

## 2017-05-23 DIAGNOSIS — E785 Hyperlipidemia, unspecified: Secondary | ICD-10-CM | POA: Diagnosis not present

## 2017-05-23 DIAGNOSIS — R51 Headache: Secondary | ICD-10-CM | POA: Diagnosis not present

## 2017-05-23 DIAGNOSIS — C060 Malignant neoplasm of cheek mucosa: Secondary | ICD-10-CM | POA: Diagnosis not present

## 2017-05-23 DIAGNOSIS — Z85528 Personal history of other malignant neoplasm of kidney: Secondary | ICD-10-CM | POA: Diagnosis not present

## 2017-05-23 DIAGNOSIS — Z905 Acquired absence of kidney: Secondary | ICD-10-CM | POA: Diagnosis not present

## 2017-05-23 DIAGNOSIS — I10 Essential (primary) hypertension: Secondary | ICD-10-CM | POA: Diagnosis not present

## 2017-05-23 DIAGNOSIS — Z8711 Personal history of peptic ulcer disease: Secondary | ICD-10-CM | POA: Diagnosis not present

## 2017-05-23 DIAGNOSIS — Z7984 Long term (current) use of oral hypoglycemic drugs: Secondary | ICD-10-CM | POA: Diagnosis not present

## 2017-05-24 ENCOUNTER — Ambulatory Visit
Admission: RE | Admit: 2017-05-24 | Discharge: 2017-05-24 | Disposition: A | Discharge status: Home / Self Care | Payer: Medicare Other | Source: Ambulatory Visit | Attending: Radiation Oncology | Admitting: Radiation Oncology

## 2017-05-24 DIAGNOSIS — C060 Malignant neoplasm of cheek mucosa: Secondary | ICD-10-CM | POA: Diagnosis not present

## 2017-05-25 ENCOUNTER — Telehealth: Payer: Self-pay | Admitting: *Deleted

## 2017-05-25 ENCOUNTER — Ambulatory Visit
Admission: RE | Admit: 2017-05-25 | Discharge: 2017-05-25 | Disposition: A | Discharge status: Home / Self Care | Payer: Medicare Other | Source: Ambulatory Visit | Attending: Radiation Oncology | Admitting: Radiation Oncology

## 2017-05-25 DIAGNOSIS — C060 Malignant neoplasm of cheek mucosa: Secondary | ICD-10-CM | POA: Diagnosis not present

## 2017-05-25 NOTE — Telephone Encounter (Signed)
Oncology Nurse Navigator Documentation  Returned call to patient SO, Colin Wagner, LVMM explaining 10/5 11:15 appt reminder they received is for Nutrition appt following his 10:30 RT.  I encouraged her to call with further questions.  [name], RN, BSN, [place] [facility] Nurse [place] [facility] [place] [phone]   .

## 2017-05-26 ENCOUNTER — Ambulatory Visit
Admission: RE | Admit: 2017-05-26 | Discharge: 2017-05-26 | Disposition: A | Discharge status: Home / Self Care | Payer: Medicare Other | Source: Ambulatory Visit | Attending: Radiation Oncology | Admitting: Radiation Oncology

## 2017-05-26 DIAGNOSIS — C060 Malignant neoplasm of cheek mucosa: Secondary | ICD-10-CM | POA: Diagnosis not present

## 2017-05-27 ENCOUNTER — Ambulatory Visit
Admission: RE | Admit: 2017-05-27 | Discharge: 2017-05-27 | Disposition: A | Discharge status: Home / Self Care | Payer: Medicare Other | Source: Ambulatory Visit | Attending: Radiation Oncology | Admitting: Radiation Oncology

## 2017-05-27 ENCOUNTER — Ambulatory Visit: Payer: Medicare Other | Admitting: Nutrition

## 2017-05-27 DIAGNOSIS — C060 Malignant neoplasm of cheek mucosa: Secondary | ICD-10-CM | POA: Diagnosis not present

## 2017-05-27 NOTE — Progress Notes (Signed)
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 Weight stable at 170.8 pounds October 5. Patient denies nausea, vomiting, constipation or diarrhea. He reports his mouth is beginning to get sore. He is tolerating 6 cans Glucerna 1.5 daily with 60 mL free water before and 120 mL water after feedings. He is tolerating some soft foods and water without difficulty by mouth.  Nutrition needs: 2230-2450 calories, 100-120 grams protein, 2.4 L fluid.  6 Glucerna 1.5 provides 2136 cal, 118 g protein, 1820 mL free water.  Nutrition diagnosis: Food and nutrition related knowledge deficit improved.  Intervention: Patient will continue 6 Glucerna 1.5 via feeding tube daily Patient will continue soft pured foods and liquids by mouth as tolerated. Questions were answered.  Teach back method used.  Monitoring, evaluation, goals: Will monitor weight and tube feeding tolerance and adjust as needed.  Next visit: Friday, October 12 before radiation therapy.  **Disclaimer: This note was dictated with voice recognition software. Similar sounding words can inadvertently be transcribed and this note may contain transcription errors which may not have been corrected upon publication of note.**

## 2017-05-30 ENCOUNTER — Other Ambulatory Visit: Payer: Self-pay | Admitting: Radiation Oncology

## 2017-05-30 ENCOUNTER — Ambulatory Visit
Admission: RE | Admit: 2017-05-30 | Discharge: 2017-05-30 | Disposition: A | Discharge status: Home / Self Care | Payer: Medicare Other | Source: Ambulatory Visit | Attending: Radiation Oncology | Admitting: Radiation Oncology

## 2017-05-30 DIAGNOSIS — C060 Malignant neoplasm of cheek mucosa: Secondary | ICD-10-CM

## 2017-05-30 MED ORDER — IBUPROFEN 100 MG/5ML PO SUSP: 400.0000 mg | Freq: Four times a day (QID) | ORAL | 4 refills | Status: DC | PRN

## 2017-05-31 ENCOUNTER — Ambulatory Visit
Admission: RE | Admit: 2017-05-31 | Discharge: 2017-05-31 | Disposition: A | Discharge status: Home / Self Care | Payer: Medicare Other | Source: Ambulatory Visit | Attending: Radiation Oncology | Admitting: Radiation Oncology

## 2017-05-31 DIAGNOSIS — C060 Malignant neoplasm of cheek mucosa: Secondary | ICD-10-CM | POA: Diagnosis not present

## 2017-06-01 ENCOUNTER — Ambulatory Visit
Admission: RE | Admit: 2017-06-01 | Discharge: 2017-06-01 | Disposition: A | Discharge status: Home / Self Care | Payer: Medicare Other | Source: Ambulatory Visit | Attending: Radiation Oncology | Admitting: Radiation Oncology

## 2017-06-01 DIAGNOSIS — C060 Malignant neoplasm of cheek mucosa: Secondary | ICD-10-CM | POA: Diagnosis not present

## 2017-06-02 ENCOUNTER — Ambulatory Visit
Admission: RE | Admit: 2017-06-02 | Discharge: 2017-06-02 | Disposition: A | Discharge status: Home / Self Care | Payer: Medicare Other | Source: Ambulatory Visit | Attending: Radiation Oncology | Admitting: Radiation Oncology

## 2017-06-02 DIAGNOSIS — C060 Malignant neoplasm of cheek mucosa: Secondary | ICD-10-CM | POA: Diagnosis not present

## 2017-06-03 ENCOUNTER — Encounter: Payer: Self-pay | Admitting: Nutrition

## 2017-06-03 ENCOUNTER — Ambulatory Visit
Admission: RE | Admit: 2017-06-03 | Discharge: 2017-06-03 | Disposition: A | Discharge status: Home / Self Care | Payer: Medicare Other | Source: Ambulatory Visit | Attending: Radiation Oncology | Admitting: Radiation Oncology

## 2017-06-03 DIAGNOSIS — C060 Malignant neoplasm of cheek mucosa: Secondary | ICD-10-CM | POA: Diagnosis not present

## 2017-06-06 ENCOUNTER — Ambulatory Visit
Admission: RE | Admit: 2017-06-06 | Discharge: 2017-06-06 | Disposition: A | Discharge status: Home / Self Care | Payer: Medicare Other | Source: Ambulatory Visit | Attending: Radiation Oncology | Admitting: Radiation Oncology

## 2017-06-06 DIAGNOSIS — C060 Malignant neoplasm of cheek mucosa: Secondary | ICD-10-CM | POA: Diagnosis not present

## 2017-06-07 ENCOUNTER — Ambulatory Visit
Admission: RE | Admit: 2017-06-07 | Discharge: 2017-06-07 | Disposition: A | Discharge status: Home / Self Care | Payer: Medicare Other | Source: Ambulatory Visit | Attending: Radiation Oncology | Admitting: Radiation Oncology

## 2017-06-07 DIAGNOSIS — C060 Malignant neoplasm of cheek mucosa: Secondary | ICD-10-CM | POA: Diagnosis not present

## 2017-06-08 ENCOUNTER — Ambulatory Visit
Admission: RE | Admit: 2017-06-08 | Discharge: 2017-06-08 | Disposition: A | Discharge status: Home / Self Care | Payer: Medicare Other | Source: Ambulatory Visit | Attending: Radiation Oncology | Admitting: Radiation Oncology

## 2017-06-08 DIAGNOSIS — C060 Malignant neoplasm of cheek mucosa: Secondary | ICD-10-CM | POA: Diagnosis not present

## 2017-06-09 ENCOUNTER — Ambulatory Visit
Admission: RE | Admit: 2017-06-09 | Discharge: 2017-06-09 | Disposition: A | Discharge status: Home / Self Care | Payer: Medicare Other | Source: Ambulatory Visit | Attending: Radiation Oncology | Admitting: Radiation Oncology

## 2017-06-09 DIAGNOSIS — C060 Malignant neoplasm of cheek mucosa: Secondary | ICD-10-CM | POA: Diagnosis not present

## 2017-06-10 ENCOUNTER — Ambulatory Visit: Payer: Medicare Other | Admitting: Nutrition

## 2017-06-10 ENCOUNTER — Ambulatory Visit
Admission: RE | Admit: 2017-06-10 | Discharge: 2017-06-10 | Disposition: A | Discharge status: Home / Self Care | Payer: Medicare Other | Source: Ambulatory Visit | Attending: Radiation Oncology | Admitting: Radiation Oncology

## 2017-06-10 DIAGNOSIS — C060 Malignant neoplasm of cheek mucosa: Secondary | ICD-10-CM | POA: Diagnosis not present

## 2017-06-10 NOTE — Progress Notes (Signed)
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  Weight improved documented as 174.4 pounds. Patient denies nausea, vomiting, constipation or diarrhea Patient's mouth is sore however, he is tolerating some fluids. He tolerates 2 cans Glucerna 1.5, 3 times a day with 60 mL free water before 120 mL after bolus feedings.  He continues to drink water by mouth.  Estimated nutrition needs: 2230-2450 calories, 100-120 grams protein, 2. 4 liters fluid.  6 Glucerna 1.5 provides 2136 cal, 118 g protein, 1820 mL free water.  Nutrition diagnosis: Food and nutrition related knowledge deficit improved.  Intervention: Patient will continue 6 Glucerna 1.5 via feeding tube daily Patient will continue soft foods and liquids by mouth as tolerated. Patient will continue to drink water by mouth as well as free water flushes via feeding tube. Questions were answered.  Teach back method used.  Monitoring, evaluation, goals: Will continue to follow patient and monitor weight and tolerance of tube feedings and oral intake.  Next visit: Thursday, October 25, during infusion.  **Disclaimer: This note was dictated with voice recognition software. Similar sounding words can inadvertently be transcribed and this note may contain transcription errors which may not have been corrected upon publication of note.**

## 2017-06-13 ENCOUNTER — Ambulatory Visit
Admission: RE | Admit: 2017-06-13 | Discharge: 2017-06-13 | Disposition: A | Discharge status: Home / Self Care | Payer: Medicare Other | Source: Ambulatory Visit | Attending: Radiation Oncology | Admitting: Radiation Oncology

## 2017-06-13 DIAGNOSIS — C060 Malignant neoplasm of cheek mucosa: Secondary | ICD-10-CM | POA: Diagnosis not present

## 2017-06-14 ENCOUNTER — Ambulatory Visit
Admission: RE | Admit: 2017-06-14 | Discharge: 2017-06-14 | Disposition: A | Discharge status: Home / Self Care | Payer: Medicare Other | Source: Ambulatory Visit | Attending: Radiation Oncology | Admitting: Radiation Oncology

## 2017-06-14 DIAGNOSIS — C060 Malignant neoplasm of cheek mucosa: Secondary | ICD-10-CM | POA: Diagnosis not present

## 2017-06-15 ENCOUNTER — Ambulatory Visit
Admission: RE | Admit: 2017-06-15 | Discharge: 2017-06-15 | Disposition: A | Discharge status: Home / Self Care | Payer: Medicare Other | Source: Ambulatory Visit | Attending: Radiation Oncology | Admitting: Radiation Oncology

## 2017-06-15 DIAGNOSIS — C060 Malignant neoplasm of cheek mucosa: Secondary | ICD-10-CM | POA: Diagnosis not present

## 2017-06-16 ENCOUNTER — Ambulatory Visit: Payer: Medicare Other | Admitting: Nutrition

## 2017-06-16 ENCOUNTER — Ambulatory Visit
Admission: RE | Admit: 2017-06-16 | Discharge: 2017-06-16 | Disposition: A | Discharge status: Home / Self Care | Payer: Medicare Other | Source: Ambulatory Visit | Attending: Radiation Oncology | Admitting: Radiation Oncology

## 2017-06-16 DIAGNOSIS — C060 Malignant neoplasm of cheek mucosa: Secondary | ICD-10-CM | POA: Diagnosis not present

## 2017-06-16 NOTE — Progress Notes (Signed)
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 Weight improved documented as 175 pounds. Patient denies nausea, vomiting, constipation, and diarrhea. Patient reports his mouth is more sore than last week and he is only tolerating water by mouth. Using 2 cans of Glucerna 1.5, 3 times a day with 60 mL free water before and 120 mL free water after bolus feedings. He is also putting 2 protein shakes through his feeding tube daily.  Estimated nutrition needs: 2230-2450 calories, 100-120 grams protein, 2.4 L fluid.  Next, Glucerna 1.5 provides 2136 cal, 118 g protein, and 1820 mL free water.  Nutrition diagnosis:  Food and nutrition related knowledge deficit improved.  Intervention: Patient will continue 6 Glucerna 1.5 via feeding tube daily with free water flushes. Recommended patient continue to drink water by mouth as tolerated. Continue protein shakes via feeding tube twice a day Teach back method used.  Monitoring, evaluation, goals: Patient will tolerate tube feeding to meet greater than 90% of estimated nutrition needs for weight maintenance and healing.  Next visit:  Wednesday, October 31 on patient's final radiation therapy day  **Disclaimer: This note was dictated with voice recognition software. Similar sounding words can inadvertently be transcribed and this note may contain transcription errors which may not have been corrected upon publication of note.**

## 2017-06-17 ENCOUNTER — Ambulatory Visit
Admission: RE | Admit: 2017-06-17 | Discharge: 2017-06-17 | Disposition: A | Discharge status: Home / Self Care | Payer: Medicare Other | Source: Ambulatory Visit | Attending: Radiation Oncology | Admitting: Radiation Oncology

## 2017-06-17 DIAGNOSIS — C060 Malignant neoplasm of cheek mucosa: Secondary | ICD-10-CM | POA: Diagnosis not present

## 2017-06-20 ENCOUNTER — Ambulatory Visit
Admission: RE | Admit: 2017-06-20 | Discharge: 2017-06-20 | Disposition: A | Discharge status: Home / Self Care | Payer: Medicare Other | Source: Ambulatory Visit | Attending: Radiation Oncology | Admitting: Radiation Oncology

## 2017-06-20 DIAGNOSIS — C060 Malignant neoplasm of cheek mucosa: Secondary | ICD-10-CM | POA: Diagnosis not present

## 2017-06-20 DIAGNOSIS — C411 Malignant neoplasm of mandible: Secondary | ICD-10-CM

## 2017-06-20 MED ORDER — BIAFINE EX EMUL
CUTANEOUS | Status: DC | PRN
  Administered 2017-06-20: 15:00:00 via TOPICAL

## 2017-06-20 MED ORDER — EMOLLIENT BASE EX CREA: TOPICAL_CREAM | CUTANEOUS | 0 refills | Status: DC | PRN

## 2017-06-21 ENCOUNTER — Ambulatory Visit
Admission: RE | Admit: 2017-06-21 | Discharge: 2017-06-21 | Disposition: A | Discharge status: Home / Self Care | Payer: Medicare Other | Source: Ambulatory Visit | Attending: Radiation Oncology | Admitting: Radiation Oncology

## 2017-06-21 DIAGNOSIS — C060 Malignant neoplasm of cheek mucosa: Secondary | ICD-10-CM | POA: Diagnosis not present

## 2017-06-22 ENCOUNTER — Encounter: Payer: Self-pay | Admitting: *Deleted

## 2017-06-22 ENCOUNTER — Ambulatory Visit: Payer: Medicare Other | Admitting: Nutrition

## 2017-06-22 ENCOUNTER — Ambulatory Visit
Admission: RE | Admit: 2017-06-22 | Discharge: 2017-06-22 | Disposition: A | Discharge status: Home / Self Care | Payer: Medicare Other | Source: Ambulatory Visit | Attending: Radiation Oncology | Admitting: Radiation Oncology

## 2017-06-22 ENCOUNTER — Encounter: Payer: Self-pay | Admitting: Radiation Oncology

## 2017-06-22 DIAGNOSIS — C060 Malignant neoplasm of cheek mucosa: Secondary | ICD-10-CM | POA: Diagnosis not present

## 2017-06-22 NOTE — Progress Notes (Signed)
Oncology Nurse Navigator Documentation  Met with Mr. Dunstan after final RT to offer support and to celebrate end of radiation treatment.  He was accompanied by his SO. I provided wife with a Certificate of Recognition for her supportive care. I provided verbal/written post-RT guidance:  Importance of keeping all follow-up appts, especially those with Nutrition and SLP.  Importance of protecting treatment area from sun.  Continuation of Sonafine application 2-3 times daily until supply exhausted after which transition to OTC lotion with vitamin E. I explained that my role as navigator will continue for several more months and that I will be calling and/or joining him during follow-up visits.   I encouraged him to call me with needs/concerns.   Patient and SO verbalized understanding of information provided.  [name], RN, BSN, [place] [facility] [place] [phone]

## 2017-06-22 NOTE — Progress Notes (Signed)
Nutrition follow-up completed with patient after his final radiation therapy for buccal mucosa cancer. Weight is stable and documented as 173.8 pounds. Patient reports soreness at the site of radiation but denies other nutrition impact symptoms. Continues to tolerate 2 cans Glucerna 1.5, 3 times a day with 60 mL free water before bolus and 120 mL free water after bolus feeding. Patient continues two protein shakes daily in his feeding tube with additional water throughout the day.  Estimated nutrition needs:  2230-2450 calories, 100-120 grams protein, 2.4 L fluid.  6 cans Glucerna 1.5 provides 2136 cal, 118 g protein, and 1820 mL free water.  Nutrition diagnosis: Food and nutrition related knowledge deficit improved.  Intervention: Enforced the importance of continuing tube feeding and protein shakes for healing and weight stabilization. Recommended patient continue to drink water by mouth as tolerated. Provided samples of [place] protein powder. Teach back method used.  Monitoring, evaluation, goals: Patient will tolerate tube feeding and oral intake to meet greater than 90% of estimated nutrition needs for maintenance and healing.  Next visit: Tuesday, November 20 before M.D. Visit  **Disclaimer: This note was dictated with voice recognition software. Similar sounding words can inadvertently be transcribed and this note may contain transcription errors which may not have been corrected upon publication of note.**

## 2017-07-01 NOTE — Progress Notes (Signed)
  Radiation Oncology         [phone]) [phone] ________________________________  Name: Colin Wagner MRN: [id number]  Date: 06/22/2017  DOB: Jan 28, 1951  End of Treatment Note  Diagnosis:   66 y.o. 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  He experienced mild fatigue and reported soreness to his mouth and base of his tongue. He used lidocaine and hydrocodone 4 times daily with mild relief. Towards the end of treatment he had increased difficulty with oral intake and primarily used his PEG tube, instilling 6 cans of osmolite daily plus protein supplement. He had mucositis and thick saliva. No thrush noted. He had stable swelling in his left jaw. He used sonafine, biafine, and neosporin to the hyperpigmented area of his neck.  Plan: The patient has completed radiation treatment. We discussed symptom management again for mucositis. He knows to continue SLP follow-up with home visits. The patient will return to radiation oncology clinic for routine followup in one half month. I advised the patient to call or return sooner if any questions or concerns arise that are related to recovery or treatment.  -----------------------------------  [name], MD  This document serves as a record of services personally performed by [name], MD. It was created on her behalf by Rae Lips, a trained medical scribe. The creation of this record is based on the scribe's personal observations and the provider's statements to them. This document has been checked and approved by the attending provider.

## 2017-07-05 ENCOUNTER — Ambulatory Visit: Payer: Self-pay | Admitting: Radiation Oncology

## 2017-07-07 ENCOUNTER — Encounter: Payer: Self-pay | Admitting: Radiation Oncology

## 2017-07-07 NOTE — Progress Notes (Signed)
[name] presents for follow up of radiation completed 06/22/17 to his Left Cheek and bilateral neck.   Pain issues, if any: He reports Left sided facial pain in the mornings and at times during the day. He takes hydrocodone 4 times daily for this pain and left knee and left shoulder pain.  Using a feeding tube?: Yes, he instills 6 cans of glucerna daily.  Weight changes, if any:  Wt Readings from Last 3 Encounters:  07/12/17 172 lb 12.8 oz (78.4 kg)  07/12/17 173 lb 12.8 oz (78.8 kg)  06/22/17 173 lb 12.8 oz (78.8 kg)   Swallowing issues, if any: He is eating softer foods such as ice cream, mashed potatoes, yogurt and soup.  Smoking or chewing tobacco? No.  Using fluoride trays daily?  Last ENT visit was on: not since diagnosis  Other notable issues, if any:  He reports thick saliva. He uses a portable suction machine he has at home.  His skin has healed. He reports itching. He has used neosporin. He is currently using biafine and cream with vitamin E with aloe.   BP 127/88   Pulse 88   Temp 98.4 F (36.9 C)   Ht 5\' 11"  (1.803 m)   Wt 172 lb 12.8 oz (78.4 kg)   SpO2 99% Comment: room air  BMI 24.10 kg/m

## 2017-07-12 ENCOUNTER — Encounter: Payer: Self-pay | Admitting: Radiation Oncology

## 2017-07-12 ENCOUNTER — Ambulatory Visit
Admission: RE | Admit: 2017-07-12 | Discharge: 2017-07-12 | Disposition: A | Discharge status: Home / Self Care | Payer: Medicare Other | Source: Ambulatory Visit | Attending: Radiation Oncology | Admitting: Radiation Oncology

## 2017-07-12 ENCOUNTER — Ambulatory Visit: Payer: Medicare Other | Admitting: Nutrition

## 2017-07-12 VITALS — BP 127/88 | HR 88 | Temp 98.4°F | Ht 71.0 in | Wt 172.8 lb

## 2017-07-12 DIAGNOSIS — Z9889 Other specified postprocedural states: Secondary | ICD-10-CM | POA: Insufficient documentation

## 2017-07-12 DIAGNOSIS — K219 Gastro-esophageal reflux disease without esophagitis: Secondary | ICD-10-CM | POA: Diagnosis not present

## 2017-07-12 DIAGNOSIS — C060 Malignant neoplasm of cheek mucosa: Secondary | ICD-10-CM | POA: Diagnosis not present

## 2017-07-12 DIAGNOSIS — F17210 Nicotine dependence, cigarettes, uncomplicated: Secondary | ICD-10-CM | POA: Diagnosis not present

## 2017-07-12 DIAGNOSIS — E785 Hyperlipidemia, unspecified: Secondary | ICD-10-CM | POA: Insufficient documentation

## 2017-07-12 DIAGNOSIS — J449 Chronic obstructive pulmonary disease, unspecified: Secondary | ICD-10-CM | POA: Insufficient documentation

## 2017-07-12 DIAGNOSIS — C411 Malignant neoplasm of mandible: Secondary | ICD-10-CM

## 2017-07-12 DIAGNOSIS — Z905 Acquired absence of kidney: Secondary | ICD-10-CM | POA: Insufficient documentation

## 2017-07-12 DIAGNOSIS — E119 Type 2 diabetes mellitus without complications: Secondary | ICD-10-CM | POA: Insufficient documentation

## 2017-07-12 DIAGNOSIS — Z8711 Personal history of peptic ulcer disease: Secondary | ICD-10-CM | POA: Insufficient documentation

## 2017-07-12 DIAGNOSIS — R5382 Chronic fatigue, unspecified: Secondary | ICD-10-CM

## 2017-07-12 DIAGNOSIS — I10 Essential (primary) hypertension: Secondary | ICD-10-CM | POA: Insufficient documentation

## 2017-07-12 DIAGNOSIS — R51 Headache: Secondary | ICD-10-CM | POA: Diagnosis not present

## 2017-07-12 DIAGNOSIS — Z85528 Personal history of other malignant neoplasm of kidney: Secondary | ICD-10-CM | POA: Insufficient documentation

## 2017-07-12 DIAGNOSIS — F1011 Alcohol abuse, in remission: Secondary | ICD-10-CM | POA: Diagnosis not present

## 2017-07-12 DIAGNOSIS — Z7984 Long term (current) use of oral hypoglycemic drugs: Secondary | ICD-10-CM | POA: Diagnosis not present

## 2017-07-12 DIAGNOSIS — Z1329 Encounter for screening for other suspected endocrine disorder: Secondary | ICD-10-CM

## 2017-07-12 HISTORY — DX: Personal history of irradiation: Z92.3

## 2017-07-12 NOTE — Progress Notes (Signed)
Radiation Oncology         (336) [phone] ________________________________  Name: Colin Wagner MRN: [id number]  Date: 07/12/2017  DOB: July 18, 1951  Follow-Up Visit Note  CC: [name], MD  [name], MD  Diagnosis and Prior Radiotherapy:       ICD-10-CM   1. Screening for hypothyroidism Z13.29 TSH  2. 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   Doing well overall.  Still using PEG tube predominantly.  Needs a new SLP since home health stopped following him   Sees ENT at [facility] in mid January.                   ALLERGIES:  has No Known Allergies.  Meds: Current Outpatient Medications  Medication Sig Dispense Refill  . Acetaminophen (TYLENOL 8 HOUR PO) 2 tablets by Feeding Tube route 2 (two) times daily as needed.     Marland Kitchen albuterol (PROVENTIL HFA;VENTOLIN HFA) 108 (90 Base) MCG/ACT inhaler Inhale 1-2 puffs into the lungs every 6 (six) hours as needed for wheezing or shortness of breath. 1 Inhaler 0  . atorvastatin (LIPITOR) 40 MG tablet     . B-D UF III MINI PEN NEEDLES 31G X 5 MM MISC   3  . B-D ULTRAFINE III SHORT PEN 31G X 8 MM MISC     . emollient (BIAFINE) cream Apply topically as needed. 454 g 0  . HYDROcodone-acetaminophen (NORCO) 7.5-325 MG tablet Take 1 tablet by mouth every 6 (six) hours as needed (pain). (Patient taking differently: 1 tablet every 6 (six) hours as needed (pain). Per feeding tube) 60 tablet 0  . ibuprofen  (ADVIL,MOTRIN) 100 MG/5ML suspension Place 20 mLs (400 mg total) into feeding tube every 6 (six) hours as needed. Take with food. 473 mL 4  . insulin aspart (NOVOLOG) 100 UNIT/ML injection Inject 0-9 Units into the skin every 4 (four) hours. 70-120--No units; 121 to 150--1 unit; 151 to 200--2 units; 201 to 250--3 units; 251 to 300--5 units; 301 to 350--7 units; 351 to 400--9 units; >400--call MD 10 mL 0  . LANTUS 100 UNIT/ML injection Inject 25 Units into the skin every evening.   1  . lidocaine (XYLOCAINE) 2 % solution Patient: Mix 1part 2% viscous lidocaine, 1part H20. Swish & swallow 50mL of diluted mixture, 41min before meals and at bedtime, up to QID 100 mL 5  . lisinopril (PRINIVIL,ZESTRIL) 5 MG tablet     . loratadine (CLARITIN) 10 MG tablet Take 10 mg by mouth daily.    Marland Kitchen LORazepam (ATIVAN) 1 MG tablet Take 1 mg by mouth 4 (four) times daily as needed for anxiety. For anxiety  5  . pantoprazole (PROTONIX) 20 MG tablet 20 mg daily. Per feeding tube    . senna (SENOKOT) 8.6 MG TABS tablet Take 1 tablet (8.6 mg total) by mouth at bedtime as needed for mild constipation. May be needed  as pain medication can constipate. (Patient taking differently: Place 1 tablet into feeding tube at bedtime as needed for mild constipation. May be needed as pain medication can constipate.) 120 each 1  . tamsulosin (FLOMAX) 0.4 MG CAPS capsule TAKE ONE CAPSULE BY MOUTH EVERY DAY FOR PROSTATE  3  . testosterone cypionate (DEPOTESTOSTERONE CYPIONATE) 200 MG/ML injection     . traZODone (DESYREL) 50 MG tablet Take 50 mg by mouth at bedtime.    . Water For Irrigation, Sterile (FREE WATER) SOLN Place 100 mLs into feeding tube every 8 (eight) hours. 1000 mL 0  . terazosin (HYTRIN) 1 MG capsule Place 1 capsule into feeding tube 2 (two) times daily.      No current facility-administered medications for this encounter.     Physical Findings: Wt Readings from Last 3 Encounters:  07/12/17 172 lb 12.8 oz (78.4 kg)    07/12/17 173 lb 12.8 oz (78.8 kg)  06/22/17 173 lb 12.8 oz (78.8 kg)    height is 5\' 11"  (1.803 m) and weight is 172 lb 12.8 oz (78.4 kg). His temperature is 98.4 F (36.9 C). His blood pressure is 127/88 and his pulse is 88. His oxygen saturation is 99%.   General: Alert and oriented, in no acute distress. HEENT: Head is normocephalic. Extraocular movements are intact. Edema around left jaw. Oropharynx is notable for healing mucosa, no sign of recurrence, +thick saliva. Neck: Neck is notable for healing skin.  No masses Skin: Skin in treatment fields shows satisfactory healing  Psychiatric: Judgment and insight are intact. Affect is appropriate.   Lab Findings: Lab Results  Component Value Date   WBC 7.4 04/25/2017   HGB 9.0 (L) 04/25/2017   HCT 27.4 (L) 04/25/2017   MCV 80.8 04/25/2017   PLT 352 04/25/2017    Lab Results  Component Value Date   TSH 1.414 05/04/2017    Radiographic Findings: No results found.  Impression/Plan:    1) Head and Neck Cancer Status: healing from RT  2) Nutritional Status: stable weight PEG tube: using  3) Risk Factors: The patient has been educated about risk factors including alcohol and tobacco abuse; they understand that avoidance of alcohol and tobacco is important to prevent recurrences as well as other cancers  4) Swallowing: will refer to SLP at [facility] for continued support  5) Dental: Encouraged to continue regular followup with dentistry, and dental hygiene including fluoride rinses.    6) Thyroid function:  Lab Results  Component Value Date   TSH 1.414 05/04/2017    7) Other: ENT f/u in 8 wks  8) Follow-up in about 10 weeks with CT of neck/chest for restaging. The patient was encouraged to call with any issues or questions before then.    _____________________________________   [name], MD  This document serves as a record of services personally performed by [name], MD. It was created on her behalf by  Rae Lips, a trained medical scribe. The creation of this record is based on the scribe's personal observations and the provider's statements to them. This document has been checked and approved by the attending provider.

## 2017-07-12 NOTE — Progress Notes (Signed)
Nutrition follow-up completed with patient.  He finished his radiation therapy about 3 weeks ago. Weight is stable and documented as 173.8 pounds. Patient reports he continues to tolerate 6 cans Glucerna 1.5 daily providing 2136 cal, 118 g protein, and 1820 mL free water. He reports he is eating soft foods such as chicken salad, mashed potatoes and gravy and oatmeal. He continues to give 2 protein shakes daily through his feeding tube.  Nutrition diagnosis: Food and nutrition related knowledge deficit improved.  Intervention: Educated patient to begin protein shakes by mouth. Recommended patient sit down at breakfast, lunch and dinner and try soft foods. For the time being, recommended patient continue tube feeding to provide weight stability and adequate calories and protein for healing. Questions answered.  Teach back method used.  Monitoring, evaluation, goals: Patient will tolerate increased oral intake so tube feeding can be decreased.  Next visit: Will continue to follow as needed.  **Disclaimer: This note was dictated with voice recognition software. Similar sounding words can inadvertently be transcribed and this note may contain transcription errors which may not have been corrected upon publication of note.**

## 2017-07-13 ENCOUNTER — Encounter (HOSPITAL_COMMUNITY): Payer: Self-pay | Admitting: Emergency Medicine

## 2017-07-13 ENCOUNTER — Emergency Department (HOSPITAL_COMMUNITY): Payer: Medicare Other

## 2017-07-13 ENCOUNTER — Emergency Department (HOSPITAL_COMMUNITY)
Admission: EM | Admit: 2017-07-13 | Discharge: 2017-07-13 | Disposition: A | Discharge status: Home / Self Care | Payer: Medicare Other | Attending: Emergency Medicine | Admitting: Emergency Medicine

## 2017-07-13 DIAGNOSIS — I10 Essential (primary) hypertension: Secondary | ICD-10-CM | POA: Insufficient documentation

## 2017-07-13 DIAGNOSIS — K9429 Other complications of gastrostomy: Secondary | ICD-10-CM | POA: Diagnosis present

## 2017-07-13 DIAGNOSIS — Z79899 Other long term (current) drug therapy: Secondary | ICD-10-CM | POA: Insufficient documentation

## 2017-07-13 DIAGNOSIS — E119 Type 2 diabetes mellitus without complications: Secondary | ICD-10-CM | POA: Diagnosis not present

## 2017-07-13 DIAGNOSIS — Z794 Long term (current) use of insulin: Secondary | ICD-10-CM | POA: Diagnosis not present

## 2017-07-13 DIAGNOSIS — Z7983 Long term (current) use of bisphosphonates: Secondary | ICD-10-CM | POA: Diagnosis not present

## 2017-07-13 DIAGNOSIS — K9423 Gastrostomy malfunction: Secondary | ICD-10-CM

## 2017-07-13 DIAGNOSIS — J449 Chronic obstructive pulmonary disease, unspecified: Secondary | ICD-10-CM | POA: Insufficient documentation

## 2017-07-13 DIAGNOSIS — Z09 Encounter for follow-up examination after completed treatment for conditions other than malignant neoplasm: Secondary | ICD-10-CM

## 2017-07-13 MED ORDER — IOPAMIDOL (ISOVUE-300) INJECTION 61%
INTRAVENOUS | Status: AC
  Administered 2017-07-13: 50 mL via GASTROSTOMY
  Filled 2017-07-13: qty 50

## 2017-07-13 MED ORDER — DIATRIZOATE MEGLUMINE & SODIUM 66-10 % PO SOLN
ORAL | Status: AC
  Filled 2017-07-13: qty 30

## 2017-07-13 NOTE — ED Notes (Signed)
MD at bedside. 

## 2017-07-13 NOTE — ED Notes (Signed)
Have updated pt on status of feeding tube.

## 2017-07-13 NOTE — Discharge Instructions (Signed)
X-ray shows good tube placement.  Follow-up with your gastroenterologist or try to obtain a gastroenterologist closer to home.  Phone number given.  Flush your tube frequently.

## 2017-07-13 NOTE — ED Provider Notes (Signed)
[facility] EMERGENCY DEPARTMENT Provider Note   CSN: [id number] Arrival date & time: 07/13/17  0847     History   Chief Complaint Chief Complaint  Patient presents with  . feeding tube clogged    HPI Colin Wagner is a 66 y.o. male.  Chief complaint:  gastrostomy tube obstructed for less than 24 hours..  Patient had a feeding tube placed at [facility] secondary to inability to eat from complications of carcinoma of the mandible.  Most of history obtained from significant other.  Feedings will not go through the lumen of the tube.  No abdominal pain, vomiting, diarrhea, fever, chills.  Severity of symptoms is moderate.      Past Medical History:  Diagnosis Date  . Alcohol abuse   . Alcoholic pancreatitis 5397   admission  . Chronic back pain   . Chronic pancreatitis ([place])    based on ct findings 2016  . COPD (chronic obstructive pulmonary disease) ([place])   . Diabetes mellitus   . Diverticulosis   . Gastritis   . GERD (gastroesophageal reflux disease)   . Headache   . History of radiation therapy 05/12/17- 06/22/17   Left cheek and bilateral neck/ 60 Gy in 30 fractions to gross disease  . Hyperlipidemia   . Hypertension   . Peptic ulcer disease 1999   Per medical reports, no H pylori  . Renal cancer, left ([place]) 2012   he tells me that he has been released, ?and that he is free of cancer, and never had it to begin with.   . Right shoulder pain   . Testicular hypofunction     Patient Active Problem List   Diagnosis Date Noted  . UTI (urinary tract infection) 04/20/2017  . UTI due to Klebsiella species 04/20/2017  . Essential hypertension 04/20/2017  . Chronic pancreatitis ([place]) 04/20/2017  . 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 Malnutrition of moderate degree 04/20/2017  . Complicated UTI (urinary tract infection)   . 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 Rectal bleeding 09/28/2016  . Hyponatremia 04/28/2015  . Abdominal  pain 04/27/2015  . Acute pancreatitis 04/27/2015  . 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 ETOH abuse 04/27/2015  . DM type 2 (diabetes mellitus, type 2) ([place]) 04/27/2015  . Hypertension, uncontrolled 04/27/2015  . Hyperlipidemia 04/27/2015  . Hypertensive urgency 04/27/2015  . Alcohol abuse     Past Surgical History:  Procedure Laterality Date  . COLONOSCOPY  2003   Dr. [name], polyps  . COLONOSCOPY  2005   Dr. [name], multiple diverticula  . COLONOSCOPY  2008   Dr. [name], diverticulosis  . COLONOSCOPY WITH PROPOFOL N/A 10/21/2016   Procedure: COLONOSCOPY WITH PROPOFOL;  Surgeon: [name], MD;  Location: [facility] END[facility];  Service: Endoscopy;  Laterality: N/A;  8:30 am  . ESOPHAGOGASTRODUODENOSCOPY     Multiple EGDs. 1999 EGD showed gastric ulcers, no H pylori and benign biopsies performed by Dr. [name]. 2001 gastric ulcer healed. Last EGD 2005 had gastritis.  . Left partial mandibulectomy, Scapular free flap reconstruction, selective neck dissection, tracheotomy, and resection of intraoral palate cancer. Left 03/01/2017   [facility]  . LUNG BIOPSY    . PARTIAL NEPHRECTOMY Left 2012  . POLYPECTOMY  10/21/2016   Procedure: POLYPECTOMY;  Surgeon: [name], MD;  Location: [facility] ENDO SUITE;  Service: Endoscopy;;  hepatic flexure x2       Home Medications  Prior to Admission medications   Medication Sig Start Date End Date Taking? Authorizing Provider  Acetaminophen (TYLENOL 8 HOUR PO) 2 tablets by Feeding Tube route 2 (two) times daily as needed.    Yes [provider]  albuterol (PROVENTIL HFA;VENTOLIN HFA) 108 (90 Base) MCG/ACT inhaler Inhale 1-2 puffs into the lungs every 6 (six) hours as needed for wheezing or shortness of breath. 09/25/16  Yes Nat Christen, MD  atorvastatin (LIPITOR) 40 MG tablet Take 40 mg by mouth daily at 6 PM.  02/14/17  Yes [provider]  B-D UF III MINI PEN NEEDLES 31G X 5 MM  MISC  03/25/17  Yes [provider]  B-D ULTRAFINE III SHORT PEN 31G X 8 MM MISC  03/27/17  Yes [provider]  emollient (BIAFINE) cream Apply topically as needed. 06/20/17  Yes [name], MD  HYDROcodone-acetaminophen (NORCO) 7.5-325 MG tablet Take 1 tablet by mouth every 6 (six) hours as needed (pain). Patient taking differently: 1 tablet every 6 (six) hours as needed (pain). Per feeding tube 01/12/17  Yes [name], MD  insulin aspart (NOVOLOG) 100 UNIT/ML injection Inject 0-9 Units into the skin every 4 (four) hours. 70-120--No units; 121 to 150--1 unit; 151 to 200--2 units; 201 to 250--3 units; 251 to 300--5 units; 301 to 350--7 units; 351 to 400--9 units; >400--call MD 04/22/17  Yes Tat, Shanon Brow, MD  LANTUS 100 UNIT/ML injection Inject 25 Units into the skin every evening.  03/09/17  Yes [provider]  lidocaine (XYLOCAINE) 2 % solution Patient: Mix 1part 2% viscous lidocaine, 1part H20. Swish & swallow 106mL of diluted mixture, 45min before meals and at bedtime, up to QID 05/16/17  Yes [name], MD  lisinopril (PRINIVIL,ZESTRIL) 5 MG tablet Take 5 mg by mouth daily.  04/06/17  Yes [provider]  loratadine (CLARITIN) 10 MG tablet Take 10 mg by mouth daily.   Yes [provider]  LORazepam (ATIVAN) 1 MG tablet Take 1 mg by mouth 4 (four) times daily as needed for anxiety. For anxiety 03/31/15  Yes [provider]  pantoprazole (PROTONIX) 20 MG tablet 20 mg daily. Per feeding tube   Yes [provider]  senna (SENOKOT) 8.6 MG TABS tablet Take 1 tablet (8.6 mg total) by mouth at bedtime as needed for mild constipation. May be needed as pain medication can constipate. Patient taking differently: Place 1 tablet into feeding tube at bedtime as needed for mild constipation. May be needed as pain medication can constipate. 01/12/17  Yes [name], MD  tamsulosin (FLOMAX) 0.4 MG CAPS capsule TAKE ONE CAPSULE BY MOUTH EVERY DAY FOR  PROSTATE 05/19/17  Yes [provider]  testosterone cypionate (DEPOTESTOSTERONE CYPIONATE) 200 MG/ML injection Inject 100 mg into the muscle every 14 (fourteen) days.  02/04/17  Yes [provider]  traZODone (DESYREL) 50 MG tablet Take 50 mg by mouth at bedtime.   Yes [provider]  Water For Irrigation, Sterile (FREE WATER) SOLN Place 100 mLs into feeding tube every 8 (eight) hours. 04/22/17  Yes Tat, Shanon Brow, MD  ibuprofen (ADVIL,MOTRIN) 100 MG/5ML suspension Place 20 mLs (400 mg total) into feeding tube every 6 (six) hours as needed. Take with food. 05/30/17   [name], MD    Family History Family History  Problem Relation Age of Onset  . Hypertension Mother   . Colon cancer Neg Hx     Social History Social History   Tobacco Use  . Smoking status: Former Smoker  Packs/day: 0.50    Years: 35.00    Pack years: 17.50  . Smokeless tobacco: Never Used  . Tobacco comment: Only smoking a few cigarettes now  Substance Use Topics  . Alcohol use: No    Comment: quit 2000 but relapse in 2016. no etoh since hospitalized 2016.   . Drug use: No     Allergies   Patient has no known allergies.   Review of Systems Review of Systems  All other systems reviewed and are negative.    Physical Exam Updated Vital Signs BP 126/70 (BP Location: Right Arm)   Pulse 67   Resp 14   Ht 5\' 11"  (1.803 m)   Wt 78 kg (172 lb)   SpO2 98%   BMI 23.99 kg/m   Physical Exam  Constitutional: He is oriented to person, place, and time. He appears well-developed and well-nourished.  HENT:  Head: Normocephalic and atraumatic.  Eyes: Conjunctivae are normal.  Neck: Neck supple.  Cardiovascular: Normal rate and regular rhythm.  Pulmonary/Chest: Effort normal and breath sounds normal.  Abdominal: Soft. Bowel sounds are normal.  Gastrostomy tube in place  Musculoskeletal: Normal range of motion.  Neurological: He is alert and oriented to person, place, and time.    Skin: Skin is warm and dry.  Psychiatric: He has a normal mood and affect. His behavior is normal.  Nursing note and vitals reviewed.    ED Treatments / Results  Labs (all labs ordered are listed, but only abnormal results are displayed) Labs Reviewed - No data to display  EKG  EKG Interpretation None       Radiology No results found.  Procedures Gastrostomy tube replacement Date/Time: 07/13/2017 1:30 PM Performed by: Nat Christen, MD Authorized by: Nat Christen, MD  Consent: Verbal consent obtained. Risks and benefits: risks, benefits and alternatives were discussed Consent given by: patient Patient understanding: patient states understanding of the procedure being performed Patient identity confirmed: verbally with patient Time out: Immediately prior to procedure a "time out" was called to verify the correct patient, procedure, equipment, support staff and site/side marked as required. Preparation: Patient was prepped and draped in the usual sterile fashion. Local anesthesia used: no  Anesthesia: Local anesthesia used: no  Sedation: Patient sedated: no  Patient tolerance: Patient tolerated the procedure well with no immediate complications Comments: Gastrostomy tube replaced with #20-gauge G-tube.  Postprocedure x-ray with Gastrografin obtained to assess placement    (including critical care time)  Medications Ordered in ED Medications - No data to display   Initial Impression / Assessment and Plan / ED Course  I have reviewed the triage vital signs and the nursing notes.  Pertinent labs & imaging results that were available during my care of the patient were reviewed by me and considered in my medical decision making (see chart for details).     Patient presents with a clogged gastrostomy tube.  Old tube was removed and a new #20-gauge G-tube inserted.  Postprocedure x-ray c contrast obtained for tube placement.  Final Clinical Impressions(s) / ED  Diagnoses   Final diagnoses:  Gastrostomy tube obstruction [facility])    ED Discharge Orders    None       Nat Christen, MD 07/13/17 1357

## 2017-07-13 NOTE — ED Triage Notes (Signed)
Patient states feeding tube is clogged this morning. He has tried warm water and coke to unclog tube. Denies pain.

## 2017-08-23 ENCOUNTER — Other Ambulatory Visit: Payer: Self-pay | Admitting: Radiation Oncology

## 2017-08-23 DIAGNOSIS — C060 Malignant neoplasm of cheek mucosa: Secondary | ICD-10-CM

## 2017-09-08 ENCOUNTER — Telehealth: Payer: Self-pay | Admitting: *Deleted

## 2017-09-08 NOTE — Telephone Encounter (Signed)
CALLED PATIENT TO INFORM OF LABS AND CT ON 09-22-17 @ [place] @ 11 AM- ARRIVAL TIME FOR TEST 2:45 PM, PT. TO GET RESULTS ON 09-23-17 BY DR. Isidore Moos, LVM FOR A RETURN CALL

## 2017-09-21 ENCOUNTER — Ambulatory Visit (INDEPENDENT_AMBULATORY_CARE_PROVIDER_SITE_OTHER): Payer: Medicare Other | Admitting: Orthopaedic Surgery

## 2017-09-21 ENCOUNTER — Encounter: Payer: Self-pay | Admitting: Orthopaedic Surgery

## 2017-09-21 ENCOUNTER — Ambulatory Visit (INDEPENDENT_AMBULATORY_CARE_PROVIDER_SITE_OTHER): Payer: Medicare Other

## 2017-09-21 VITALS — BP 112/60 | HR 80 | Ht 71.0 in | Wt 180.0 lb

## 2017-09-21 DIAGNOSIS — G5602 Carpal tunnel syndrome, left upper limb: Secondary | ICD-10-CM | POA: Diagnosis not present

## 2017-09-21 DIAGNOSIS — M25512 Pain in left shoulder: Secondary | ICD-10-CM

## 2017-09-21 DIAGNOSIS — M7502 Adhesive capsulitis of left shoulder: Secondary | ICD-10-CM

## 2017-09-21 NOTE — Progress Notes (Signed)
Patient Colin Wagner, male DOB:1950/08/25, 67 y.o. BTD:[id number]  Chief Complaint  Patient presents with  . Shoulder Pain    left     HPI  Colin Wagner is a 67 y.o. male who has left shoulder pain.  He has had pain since his cancer surgery and grafting procedures to his left jaw.  He had donor site from the left posterior scapula area.  He limited use of the left shoulder post procedure.  Now he cannot move it very well.  He also complains of numbness of the fingers on the left hand more at night in the median nerve distribution.  He has no trauma, no redness.  He has no swelling of the shoulder.  He has a feeding tube in abdomen. HPI  Body mass index is 25.1 kg/m.  ROS  Review of Systems  HENT: Positive for facial swelling.   Musculoskeletal: Positive for arthralgias and back pain.  Neurological: Positive for speech difficulty.  All other systems reviewed and are negative.  Past Medical History:  Diagnosis Date  . Alcohol abuse   . Alcoholic pancreatitis 3646   admission  . Chronic back pain   . Chronic pancreatitis ([place])    based on ct findings 2016  . COPD (chronic obstructive pulmonary disease) ([place])   . Diabetes mellitus   . Diverticulosis   . Gastritis   . GERD (gastroesophageal reflux disease)   . Headache   . History of radiation therapy 05/12/17- 06/22/17   Left cheek and bilateral neck/ 60 Gy in 30 fractions to gross disease  . Hyperlipidemia   . Hypertension   . Peptic ulcer disease 1999   Per medical reports, no H pylori  . Renal cancer, left ([place]) 2012   he tells me that he has been released, ?and that he is free of cancer, and never had it to begin with.   . Right shoulder pain   . Testicular hypofunction     Past Surgical History:  Procedure Laterality Date  . COLONOSCOPY  2003   Dr. [name], polyps  . COLONOSCOPY  2005   Dr. [name], multiple diverticula  . COLONOSCOPY  2008   Dr. [name], diverticulosis  . COLONOSCOPY WITH  PROPOFOL N/A 10/21/2016   Procedure: COLONOSCOPY WITH PROPOFOL;  Surgeon: [name], MD;  [facility];  Service: Endoscopy;  Laterality: N/A;  8:30 am  . ESOPHAGOGASTRODUODENOSCOPY     Multiple EGDs. 1999 EGD showed gastric ulcers, no H pylori and benign biopsies performed by Dr. [name]. 2001 gastric ulcer healed. Last EGD 2005 had gastritis.  . Left partial mandibulectomy, Scapular free flap reconstruction, selective neck dissection, tracheotomy, and resection of intraoral palate cancer. Left 03/01/2017   [facility]  . LUNG BIOPSY    . PARTIAL NEPHRECTOMY Left 2012  . POLYPECTOMY  10/21/2016   Procedure: POLYPECTOMY;  Surgeon: [name], MD;  [facility];  Service: Endoscopy;;  hepatic flexure x2    Current Outpatient Medications on File Prior to Visit  Medication Sig Dispense Refill  . Acetaminophen (TYLENOL 8 HOUR PO) 2 tablets by Feeding Tube route 2 (two) times daily as needed.     Marland Kitchen albuterol (PROVENTIL HFA;VENTOLIN HFA) 108 (90 Base) MCG/ACT inhaler Inhale 1-2 puffs into the lungs every 6 (six) hours as needed for wheezing or shortness of breath. 1 Inhaler 0  . atorvastatin (LIPITOR) 40 MG tablet Take 40 mg by mouth daily at 6 PM.     .  B-D UF III MINI PEN NEEDLES 31G X 5 MM MISC   3  . B-D ULTRAFINE III SHORT PEN 31G X 8 MM MISC     . emollient (BIAFINE) cream Apply topically as needed. 454 g 0  . HYDROcodone-acetaminophen (NORCO) 7.5-325 MG tablet Take 1 tablet by mouth every 6 (six) hours as needed (pain). (Patient taking differently: 1 tablet every 6 (six) hours as needed (pain). Per feeding tube) 60 tablet 0  . ibuprofen (ADVIL,MOTRIN) 100 MG/5ML suspension Place 20 mLs (400 mg total) into feeding tube every 6 (six) hours as needed. Take with food. 473 mL 4  . insulin aspart (NOVOLOG) 100 UNIT/ML injection Inject 0-9 Units into the skin every 4 (four) hours. 70-120--No units; 121 to 150--1 unit; 151 to 200--2 units; 201 to  250--3 units; 251 to 300--5 units; 301 to 350--7 units; 351 to 400--9 units; >400--call MD 10 mL 0  . LANTUS 100 UNIT/ML injection Inject 25 Units into the skin every evening.   1  . lidocaine (XYLOCAINE) 2 % solution MIX 1 PART SOLUTION & 1 PART WATER SWISH & SWALLOW 10MLS OF MIXTURE 30 MINS BEFORE MEALS UP TO 4/DAY 100 mL 0  . lisinopril (PRINIVIL,ZESTRIL) 5 MG tablet Take 5 mg by mouth daily.     Marland Kitchen loratadine (CLARITIN) 10 MG tablet Take 10 mg by mouth daily.    Marland Kitchen LORazepam (ATIVAN) 1 MG tablet Take 1 mg by mouth 4 (four) times daily as needed for anxiety. For anxiety  5  . pantoprazole (PROTONIX) 20 MG tablet 20 mg daily. Per feeding tube    . senna (SENOKOT) 8.6 MG TABS tablet Take 1 tablet (8.6 mg total) by mouth at bedtime as needed for mild constipation. May be needed as pain medication can constipate. (Patient taking differently: Place 1 tablet into feeding tube at bedtime as needed for mild constipation. May be needed as pain medication can constipate.) 120 each 1  . tamsulosin (FLOMAX) 0.4 MG CAPS capsule TAKE ONE CAPSULE BY MOUTH EVERY DAY FOR PROSTATE  3  . testosterone cypionate (DEPOTESTOSTERONE CYPIONATE) 200 MG/ML injection Inject 100 mg into the muscle every 14 (fourteen) days.     . traZODone (DESYREL) 50 MG tablet Take 50 mg by mouth at bedtime.    . Water For Irrigation, Sterile (FREE WATER) SOLN Place 100 mLs into feeding tube every 8 (eight) hours. 1000 mL 0   No current facility-administered medications on file prior to visit.     Social History   Socioeconomic History  . Marital status: Divorced    Spouse name: Not on file  . Number of children: 0  . Years of education: Not on file  . Highest education level: Not on file  Social Needs  . Financial resource strain: Not on file  . Food insecurity - worry: Not on file  . Food insecurity - inability: Not on file  . Transportation needs - medical: Not on file  . Transportation needs - non-medical: Not on file   Occupational History  . Not on file  Tobacco Use  . Smoking status: Former Smoker    Packs/day: 0.50    Years: 35.00    Pack years: 17.50  . Smokeless tobacco: Never Used  . Tobacco comment: Only smoking a few cigarettes now  Substance and Sexual Activity  . Alcohol use: No    Comment: quit 2000 but relapse in 2016. no etoh since hospitalized 2016.   . Drug use: No  . Sexual activity: Not  on file  Other Topics Concern  . 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   Patient with a history of smoking half pack a day for 35+ years. Patient currently only smoking a few cigarettes per day.   The patient has history of alcohol abuse. Patient quit in 2000 and then relapsed in 2016. Patient has not had any alcohol since 2016.    Family History  Problem Relation Age of Onset  . Hypertension Mother   . Colon cancer Neg Hx     BP 112/60   Pulse 80   Ht 5\' 11"  (1.803 m)   Wt 180 lb (81.6 kg)   BMI 25.10 kg/m    Past Medical History:  Diagnosis Date  . Alcohol abuse   . Alcoholic pancreatitis 0623   admission  . Chronic back pain   . Chronic pancreatitis ([place])    based on ct findings 2016  . COPD (chronic obstructive pulmonary disease) ([place])   . Diabetes mellitus   . Diverticulosis   . Gastritis   . GERD (gastroesophageal reflux disease)   . Headache   . History of radiation therapy 05/12/17- 06/22/17   Left cheek and bilateral neck/ 60 Gy in 30 fractions to gross disease  . Hyperlipidemia   . Hypertension   . Peptic ulcer disease 1999   Per medical reports, no H pylori  . Renal cancer, left ([place]) 2012   he tells me that he has been released, ?and that he is free of cancer, and never had it to begin with.   . Right shoulder pain   . Testicular hypofunction     Past Surgical History:  Procedure Laterality Date  . COLONOSCOPY  2003   Dr. [name], polyps  . COLONOSCOPY  2005   Dr. [name],  multiple diverticula  . COLONOSCOPY  2008   Dr. [name], diverticulosis  . COLONOSCOPY WITH PROPOFOL N/A 10/21/2016   Procedure: COLONOSCOPY WITH PROPOFOL;  Surgeon: [name], MD;  [facility];  Service: Endoscopy;  Laterality: N/A;  8:30 am  . ESOPHAGOGASTRODUODENOSCOPY     Multiple EGDs. 1999 EGD showed gastric ulcers, no H pylori and benign biopsies performed by Dr. [name]. 2001 gastric ulcer healed. Last EGD 2005 had gastritis.  . Left partial mandibulectomy, Scapular free flap reconstruction, selective neck dissection, tracheotomy, and resection of intraoral palate cancer. Left 03/01/2017   [facility]  . LUNG BIOPSY    . PARTIAL NEPHRECTOMY Left 2012  . POLYPECTOMY  10/21/2016   Procedure: POLYPECTOMY;  Surgeon: [name], MD;  [facility];  Service: Endoscopy;;  hepatic flexure x2    Family History  Problem Relation Age of Onset  . Hypertension Mother   . Colon cancer Neg Hx     Social History Social History   Tobacco Use  . Smoking status: Former Smoker    Packs/day: 0.50    Years: 35.00    Pack years: 17.50  . Smokeless tobacco: Never Used  . Tobacco comment: Only smoking a few cigarettes now  Substance Use Topics  . Alcohol use: No    Comment: quit 2000 but relapse in 2016. no etoh since hospitalized 2016.   . Drug use: No    No Known Allergies  Current Outpatient Medications  Medication Sig Dispense Refill  . Acetaminophen (TYLENOL 8 HOUR PO) 2 tablets by Feeding Tube route 2 (  two) times daily as needed.     Marland Kitchen albuterol (PROVENTIL HFA;VENTOLIN HFA) 108 (90 Base) MCG/ACT inhaler Inhale 1-2 puffs into the lungs every 6 (six) hours as needed for wheezing or shortness of breath. 1 Inhaler 0  . atorvastatin (LIPITOR) 40 MG tablet Take 40 mg by mouth daily at 6 PM.     . B-D UF III MINI PEN NEEDLES 31G X 5 MM MISC   3  . B-D ULTRAFINE III SHORT PEN 31G X 8 MM MISC     . emollient (BIAFINE) cream Apply  topically as needed. 454 g 0  . HYDROcodone-acetaminophen (NORCO) 7.5-325 MG tablet Take 1 tablet by mouth every 6 (six) hours as needed (pain). (Patient taking differently: 1 tablet every 6 (six) hours as needed (pain). Per feeding tube) 60 tablet 0  . ibuprofen (ADVIL,MOTRIN) 100 MG/5ML suspension Place 20 mLs (400 mg total) into feeding tube every 6 (six) hours as needed. Take with food. 473 mL 4  . insulin aspart (NOVOLOG) 100 UNIT/ML injection Inject 0-9 Units into the skin every 4 (four) hours. 70-120--No units; 121 to 150--1 unit; 151 to 200--2 units; 201 to 250--3 units; 251 to 300--5 units; 301 to 350--7 units; 351 to 400--9 units; >400--call MD 10 mL 0  . LANTUS 100 UNIT/ML injection Inject 25 Units into the skin every evening.   1  . lidocaine (XYLOCAINE) 2 % solution MIX 1 PART SOLUTION & 1 PART WATER SWISH & SWALLOW 10MLS OF MIXTURE 30 MINS BEFORE MEALS UP TO 4/DAY 100 mL 0  . lisinopril (PRINIVIL,ZESTRIL) 5 MG tablet Take 5 mg by mouth daily.     Marland Kitchen loratadine (CLARITIN) 10 MG tablet Take 10 mg by mouth daily.    Marland Kitchen LORazepam (ATIVAN) 1 MG tablet Take 1 mg by mouth 4 (four) times daily as needed for anxiety. For anxiety  5  . pantoprazole (PROTONIX) 20 MG tablet 20 mg daily. Per feeding tube    . senna (SENOKOT) 8.6 MG TABS tablet Take 1 tablet (8.6 mg total) by mouth at bedtime as needed for mild constipation. May be needed as pain medication can constipate. (Patient taking differently: Place 1 tablet into feeding tube at bedtime as needed for mild constipation. May be needed as pain medication can constipate.) 120 each 1  . tamsulosin (FLOMAX) 0.4 MG CAPS capsule TAKE ONE CAPSULE BY MOUTH EVERY DAY FOR PROSTATE  3  . testosterone cypionate (DEPOTESTOSTERONE CYPIONATE) 200 MG/ML injection Inject 100 mg into the muscle every 14 (fourteen) days.     . traZODone (DESYREL) 50 MG tablet Take 50 mg by mouth at bedtime.    . Water For Irrigation, Sterile (FREE WATER) SOLN Place 100 mLs into  feeding tube every 8 (eight) hours. 1000 mL 0   No current facility-administered medications for this visit.      Physical Exam  Blood pressure 112/60, pulse 80, height 5\' 11"  (1.803 m), weight 180 lb (81.6 kg).  Constitutional: overall normal hygiene, normal nutrition, well developed, normal grooming, normal body habitus. Assistive device:none  Musculoskeletal: gait and station Limp none, muscle tone and strength are normal, no tremors or atrophy is present.  .  Neurological: coordination overall normal.  Deep tendon reflex/nerve stretch intact.  Sensation normal.  Cranial nerves II-XII intact.   Skin:   Normal overall no scars, lesions, ulcers or rashes. No psoriasis.  Psychiatric: Alert and oriented x 3.  Recent memory intact, remote memory unclear.  Normal mood and affect. Well groomed.  Good eye  contact.  Cardiovascular: overall no swelling, no varicosities, no edema bilaterally, normal temperatures of the legs and arms, no clubbing, cyanosis and good capillary refill.  Lymphatic: palpation is normal.  Examination of left Upper Extremity is done.  Inspection:   Overall:  Elbow non-tender without crepitus or defects, forearm non-tender without crepitus or defects, wrist non-tender without crepitus or defects, hand non-tender.    Shoulder: with glenohumeral joint tenderness, without effusion.   Upper arm: without swelling and tenderness   Range of motion:   Overall:  Full range of motion of the elbow, full range of motion of wrist and full range of motion in fingers.   Shoulder:  left  75 degrees forward flexion; 45 degrees abduction; 10 degrees internal rotation, 10 degrees external rotation, 5 degrees extension, 20 degrees adduction.   Stability:   Overall:  Shoulder, elbow and wrist stable   Strength and Tone:   Overall full shoulder muscles strength, full upper arm strength and normal upper arm bulk and tone.  All other systems reviewed and are negative   The patient  has been educated about the nature of the problem(s) and counseled on treatment options.  The patient appeared to understand what I have discussed and is in agreement with it.  Encounter Diagnoses  Name Primary?  . Pain in joint of left shoulder Yes  . Adhesive capsulitis of left shoulder   . Carpal tunnel syndrome of left wrist    He may have some nerve problems in the hand from the prior surgery but I want to check EMGs.  I feel he developed adhesive capsulitis post surgery and not moving his shoulder very much.  PT will be started.  PROCEDURE NOTE:  The patient request injection, verbal consent was obtained.  The left shoulder was prepped appropriately after time out was performed.   Sterile technique was observed and injection of 1 cc of Depo-Medrol 40 mg with several cc's of plain xylocaine. Anesthesia was provided by ethyl chloride and a 20-gauge needle was used to inject the shoulder area. A posterior approach was used.  The injection was tolerated well.  A band aid dressing was applied.  The patient was advised to apply ice later today and tomorrow to the injection sight as needed.   PLAN Call if any problems.  Precautions discussed.  Continue current medications.   Return to clinic 2 weeks  Begin PT/OT left shoulder   Electronically Signed Sanjuana Kava, MD 1/30/20199:07 AM

## 2017-09-21 NOTE — Patient Instructions (Signed)
An order has been sent to [facility]  [address] [place], [place]  [address].  Phone number [phone] requesting a nerve conduction study for you.  They will call you with an appointment.  If you have not been given an appointment in a reasonable amount of time please call them and ask to be scheduled.

## 2017-09-22 ENCOUNTER — Ambulatory Visit (HOSPITAL_COMMUNITY)
Admission: RE | Admit: 2017-09-22 | Discharge: 2017-09-22 | Disposition: A | Discharge status: Home / Self Care | Payer: Medicare Other | Source: Ambulatory Visit | Attending: Radiation Oncology | Admitting: Radiation Oncology

## 2017-09-22 DIAGNOSIS — C411 Malignant neoplasm of mandible: Secondary | ICD-10-CM

## 2017-09-23 ENCOUNTER — Ambulatory Visit
Admit: 2017-09-23 | Discharge: 2017-09-23 | Disposition: A | Discharge status: Home / Self Care | Payer: Medicare Other | Source: Ambulatory Visit | Attending: Radiation Oncology | Admitting: Radiation Oncology

## 2017-09-23 ENCOUNTER — Telehealth: Payer: Self-pay | Admitting: *Deleted

## 2017-09-23 DIAGNOSIS — Z923 Personal history of irradiation: Secondary | ICD-10-CM | POA: Insufficient documentation

## 2017-09-23 DIAGNOSIS — R682 Dry mouth, unspecified: Secondary | ICD-10-CM | POA: Insufficient documentation

## 2017-09-23 DIAGNOSIS — I2510 Atherosclerotic heart disease of native coronary artery without angina pectoris: Secondary | ICD-10-CM | POA: Insufficient documentation

## 2017-09-23 DIAGNOSIS — C060 Malignant neoplasm of cheek mucosa: Secondary | ICD-10-CM | POA: Insufficient documentation

## 2017-09-23 DIAGNOSIS — Z931 Gastrostomy status: Secondary | ICD-10-CM | POA: Insufficient documentation

## 2017-09-23 DIAGNOSIS — Z79891 Long term (current) use of opiate analgesic: Secondary | ICD-10-CM | POA: Insufficient documentation

## 2017-09-23 DIAGNOSIS — I700 Atherosclerosis of aorta: Secondary | ICD-10-CM | POA: Insufficient documentation

## 2017-09-23 DIAGNOSIS — Y842 Radiological procedure and radiotherapy as the cause of abnormal reaction of the patient, or of later complication, without mention of misadventure at the time of the procedure: Secondary | ICD-10-CM | POA: Insufficient documentation

## 2017-09-23 DIAGNOSIS — Z794 Long term (current) use of insulin: Secondary | ICD-10-CM | POA: Insufficient documentation

## 2017-09-23 DIAGNOSIS — M19012 Primary osteoarthritis, left shoulder: Secondary | ICD-10-CM | POA: Insufficient documentation

## 2017-09-23 DIAGNOSIS — Z791 Long term (current) use of non-steroidal anti-inflammatories (NSAID): Secondary | ICD-10-CM | POA: Insufficient documentation

## 2017-09-23 DIAGNOSIS — Z79899 Other long term (current) drug therapy: Secondary | ICD-10-CM | POA: Insufficient documentation

## 2017-09-23 DIAGNOSIS — I6521 Occlusion and stenosis of right carotid artery: Secondary | ICD-10-CM | POA: Insufficient documentation

## 2017-09-23 DIAGNOSIS — D1800 Hemangioma unspecified site: Secondary | ICD-10-CM | POA: Insufficient documentation

## 2017-09-23 NOTE — Telephone Encounter (Signed)
CALLED PATIENT TO INFORM OF CT @ [facility], PT. TO ARRIVE FOR STAT LABS ON 10-06-17 @ 9:30 AM TEST TO BE @ 10 AM @ [facility], PT. TO BE NPO- 4 HRS, PRIOR TO TEST , PATIENT TO FU WITH DR. Isidore Moos FOR RESULTS ON 10-14-17 @ 2:20 PM, SPOKE WITH PATIENT'S FRIEND, JEANNETTE

## 2017-09-26 ENCOUNTER — Telehealth (HOSPITAL_COMMUNITY): Payer: Self-pay | Admitting: Family Medicine

## 2017-09-26 NOTE — Telephone Encounter (Signed)
09/26/17  Left a message to change the 2/6 appt to 2/5 at same time, per [facility].Marland KitchenMarland KitchenMarland Kitchen

## 2017-09-27 ENCOUNTER — Ambulatory Visit (HOSPITAL_COMMUNITY): Payer: Medicare Other | Attending: Orthopaedic Surgery | Admitting: Specialist

## 2017-09-27 ENCOUNTER — Encounter (HOSPITAL_COMMUNITY): Payer: Self-pay | Admitting: Specialist

## 2017-09-27 ENCOUNTER — Other Ambulatory Visit: Payer: Self-pay

## 2017-09-27 DIAGNOSIS — M25512 Pain in left shoulder: Secondary | ICD-10-CM

## 2017-09-27 DIAGNOSIS — M25542 Pain in joints of left hand: Secondary | ICD-10-CM | POA: Insufficient documentation

## 2017-09-27 DIAGNOSIS — M25612 Stiffness of left shoulder, not elsewhere classified: Secondary | ICD-10-CM | POA: Insufficient documentation

## 2017-09-27 DIAGNOSIS — R29898 Other symptoms and signs involving the musculoskeletal system: Secondary | ICD-10-CM | POA: Diagnosis present

## 2017-09-27 NOTE — Therapy (Signed)
[place], [place], [address] Phone: [phone]   Fax:  [phone]  Occupational Therapy Evaluation  Patient Details  Name: Colin Wagner MRN: [id number] Date of Birth: 10/28/1950 Referring Provider: Dr. Sanjuana Kava   Encounter Date: 09/27/2017  OT End of Session - 09/27/17 1350    Visit Number  1    Number of Visits  16    Date for OT Re-Evaluation  11/26/17 mini reassess on 10/27/17    Authorization Type  Medicare    OT Start Time  1030    OT Stop Time  1125    OT Time Calculation (min)  55 min    Activity Tolerance  Patient tolerated treatment well    Behavior During Therapy  [facility] for tasks assessed/performed       Past Medical History:  Diagnosis Date  . Alcohol abuse   . Alcoholic pancreatitis 6789   admission  . Chronic back pain   . Chronic pancreatitis ([place])    based on ct findings 2016  . COPD (chronic obstructive pulmonary disease) ([place])   . Diabetes mellitus   . Diverticulosis   . Gastritis   . GERD (gastroesophageal reflux disease)   . Headache   . History of radiation therapy 05/12/17- 06/22/17   Left cheek and bilateral neck/ 60 Gy in 30 fractions to gross disease  . Hyperlipidemia   . Hypertension   . Peptic ulcer disease 1999   Per medical reports, no H pylori  . Renal cancer, left ([place]) 2012   he tells me that he has been released, ?and that he is free of cancer, and never had it to begin with.   . Right shoulder pain   . Testicular hypofunction     Past Surgical History:  Procedure Laterality Date  . COLONOSCOPY  2003   Dr. [name], polyps  . COLONOSCOPY  2005   Dr. [name], multiple diverticula  . COLONOSCOPY  2008   Dr. [name], diverticulosis  . COLONOSCOPY WITH PROPOFOL N/A 10/21/2016   Procedure: COLONOSCOPY WITH PROPOFOL;  Surgeon: [name], MD;  Location: [facility];  Service: Endoscopy;  Laterality: N/A;  8:30 am  . ESOPHAGOGASTRODUODENOSCOPY      Multiple EGDs. 1999 EGD showed gastric ulcers, no H pylori and benign biopsies performed by Dr. [name]. 2001 gastric ulcer healed. Last EGD 2005 had gastritis.  . Left partial mandibulectomy, Scapular free flap reconstruction, selective neck dissection, tracheotomy, and resection of intraoral palate cancer. Left 03/01/2017   [facility]  . LUNG BIOPSY    . PARTIAL NEPHRECTOMY Left 2012  . POLYPECTOMY  10/21/2016   Procedure: POLYPECTOMY;  Surgeon: [name], MD;  Location: [facility];  Service: Endoscopy;;  hepatic flexure x2    There were no vitals filed for this visit.  Subjective Assessment - 09/27/17 1342    Subjective   S:  I cant reach up above my shoulder very well.      Pertinent History  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  He underwent a resection of the left mandible, maxilla buccal mucosa and pharynx with parascapular flap and radial flap on 03/01/17.  He then had radiation treatment to his left mandibular region.  He presents today to this clinic with increased pain and stiffness in his left shoulder and cervical region, as well as pain in  his left hand.  He has been referred to occupational therapy for evaluation and treatment by Dr. Luna Glasgow.      Special Tests  FOTO 60% independent    Patient Stated Goals  I want to get function back in my left arm.    Currently in Pain?  Yes    Pain Location  Shoulder    Pain Orientation  Left    Pain Descriptors / Indicators  Aching;Tightness;Jabbing    Pain Type  Acute pain    Pain Radiating Towards  left shoulder region and wrist into hand     Pain Onset  More than a month ago    Pain Frequency  Intermittent    Aggravating Factors   reaching above shoulder height    Pain Relieving Factors  heat    Effect of Pain on Daily Activities  moderate         [facility] OT Assessment - 09/27/17 0001      Assessment   Medical  Diagnosis  Left Adhesive Capsulitis and Carpal Tunnel Syndrome    Referring Provider  Dr. Sanjuana Kava    Onset Date/Surgical Date  03/01/17    Hand Dominance  Right    Next MD Visit  10/05/2017    Prior Therapy  home health ST, OT in acute care setting      Precautions   Precautions  Other (comment) cancer      Restrictions   Weight Bearing Restrictions  No      Balance Screen   Has the patient fallen in the past 6 months  No    Has the patient had a decrease in activity level because of a fear of falling?   No    Is the patient reluctant to leave their home because of a fear of falling?   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 Horsman requires assistance with donning and doffing his clothing due to decreased shoulder mobility.  He has difficulty reaching above shoulder height, behind his head and back.  He has difficulty maintaining grasp on cups and opening containers.       Written Expression   Dominant Hand  Right      Vision - History   Baseline Vision  Wears glasses all the time      Cognition   Overall Cognitive Status  Within Functional Limits for tasks assessed      Observation/Other Assessments   Focus on Therapeutic Outcomes (FOTO)   60% independent      Sensation   Light Touch  Appears Intact assessed and intact in bilateral hands      Coordination   Gross Motor Movements are Fluid and Coordinated  Yes    9 Hole Peg Test  Right;Left    Right 9 Hole Peg Test  26.57"    Left 9 Hole Peg Test  29.19"      ROM / Strength   AROM / PROM / Strength  AROM;PROM;Strength      Palpation   Palpation comment  mod-max fascial restrictions in left shoulder,  trapezius, scapular region. Healed surgical incision in lateral scapular region wth dense max  scar restrictions.  Radiation tightness and restrictions in left cervical region      AROM   Overall AROM Comments  assessed in seated, external and internal rotation with shoulder adducted.  Wrist A/ROM is [facility]    AROM Assessment Site  Shoulder;Wrist    Right/Left Shoulder  Left    Left Shoulder Flexion  90 Degrees    Left Shoulder ABduction  60 Degrees    Left Shoulder Internal Rotation  85 Degrees    Left Shoulder External Rotation  0 Degrees    Right/Left Wrist  Left      PROM   Overall PROM Comments  assessed in supine, wrist P/ROM is [facility]    PROM Assessment Site  Shoulder;Wrist    Right/Left Shoulder  Left    Left Shoulder Flexion  60 Degrees    Left Shoulder ABduction  65 Degrees    Left Shoulder Internal Rotation  85 Degrees    Left Shoulder External Rotation  10 Degrees      Strength   Overall Strength Comments  assessed in seated    Strength Assessment Site  Shoulder;Wrist    Right/Left Shoulder  Left    Left Shoulder Flexion  3+/5    Left Shoulder ABduction  3+/5    Left Shoulder Internal Rotation  4-/5    Left Shoulder External Rotation  4-/5    Right/Left Wrist  Left    Left Wrist Flexion  5/5    Left Wrist Extension  5/5    Left Wrist Radial Deviation  5/5    Left Wrist Ulnar Deviation  5/5      Hand Function   Right Hand Grip (lbs)  90    Right Hand Lateral Pinch  22 lbs    Right Hand 3 Point Pinch  16 lbs    Left Hand Grip (lbs)  30    Left Hand Lateral Pinch  12 lbs    Left 3 point pinch  10 lbs               OT Treatments/Exercises (OP) - 09/27/17 0001      Manual Therapy   Manual Therapy  Myofascial release    Manual therapy comments  manual therapy completed seperately from all other interventions this date     Myofascial Release  myofascial release and scar tissue release to left upper arm, scapular, trapezius, and shoulder region to decrease pain  and restrictions and improve pain free mobility needed to improve functional use of his left upper extremity.            OT Education - 09/27/17 1346    Education provided  Yes    Education Details  educated patient on HEP for left shoulder towel slides, tendon glides and theraputty for left hand strengthening     Person(s) Educated  Patient    Methods  Explanation;Handout    Comprehension  Verbalized understanding       OT Short Term Goals - 09/27/17 1357      OT SHORT TERM GOAL #1   Title  Patient will be educated on a HEP for improved mobility and strength in his left shoulder and hand in order to reach into overhead cabinets at home.     Time  4    Period  Weeks    Status  New    Target Date  10/27/17  OT SHORT TERM GOAL #2   Title  Patient will improve left shoulder P/ROM to [facility] in order to don and doff clothing with greater independence.     Time  4    Period  Weeks    Status  New      OT SHORT TERM GOAL #3   Title  Patient will improve left shoulder strength to 4/5 for greater ability to lift tools when working in his yard.     Time  4    Period  Weeks    Status  New      OT SHORT TERM GOAL #4   Title  Patient will imrove left grip strength by 15 pounds and pinch strength by 2 pounds for greater ability to open jars and containers.     Time  4    Period  Weeks    Status  New      OT SHORT TERM GOAL #5   Title  Patient will decrease pain in his left shoulder and hand to 4/10 or better when completing daily activities.     Time  4    Period  Weeks    Status  New      Additional Short Term Goals   Additional Short Term Goals  Yes      OT SHORT TERM GOAL #6   Title  Patient will decrease fascial and scar restrictions in left shoulder region from max to mod max for improved moblity needed to use left arm actively with daily activities.    Time  4    Period  Weeks    Status  New        OT Long Term Goals - 09/27/17 1400      OT LONG TERM GOAL #1    Title  Patient will use his left arm actively wtih all daily and leisure activities without hesitation or compensatory movements.     Time  8    Period  Weeks    Status  New    Target Date  11/26/17      OT LONG TERM GOAL #2   Title  Patient will improve left shoulder A/ROM to [facility] in order to reach overhead when putting groceries away and retrieving items from cabinets.     Time  8    Period  Weeks    Status  New      OT LONG TERM GOAL #3   Title  Patient will improve left shoulder strength to 4+/5 or better for improved ability to lift gardening tools and bags of groceries.     Time  8    Period  Weeks    Status  New      OT LONG TERM GOAL #4   Title  Patient will improve left hand grip strength to 60# or more and pinch strength to 14 pounds or more for improved ability to maintain grip on household objects.     Time  8    Period  Weeks    Status  New      OT LONG TERM GOAL #5   Title  Patient will decrease pain in his left hand and shoulder to 2/10 or better when completing functional activities.     Time  8    Period  Weeks    Status  New      Long Term Additional Goals   Additional Long Term Goals  Yes      OT LONG  TERM GOAL #6   Title  Patient will decrease scar restrictions and fascial restrictions in his left scapular and forearm regions for improved mobility required to complete ADLs.    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  He had surgery on 03/01/17 for resection of the left mandible, maxilla, buccal mucosa pharynx with parascapular and radial flap graft.  He had several radiation sessions.  He was PEG tube dependent and is now beginning to eat by mouth.  Since his surgery and  radiation, he has noted inceased pain and stiffness in his left shoulder region and increase pain and weakness in his left hand.  he consulted with Dr. Luna Glasgow and has been referred to occupational therapy for evaluation and treatment.      Occupational Profile and client history currently impacting functional performance  strong work ethic and motivation to improve    Occupational performance deficits (Please refer to evaluation for details):  ADL's;IADL's;Rest and Sleep;Leisure;Social Participation    Rehab Potential  Good    Current Impairments/barriers affecting progress:  medical history and radiation    OT Frequency  2x / week    OT Duration  8 weeks    OT Treatment/Interventions  Self-care/ADL training;Therapeutic exercise;Moist Heat;Neuromuscular education;Patient/family education;Splinting;Energy conservation;Scar mobilization;Therapeutic activities;Passive range of motion;Manual Therapy;Contrast Bath;Ultrasound;Cryotherapy    Plan  P:  Skilled OT intervention is indicated to improve pain free mobility and strength in his left arm and hand in order to return to prior level of independence with all B/IADLs and leisure activities.  Next session:  Scar release to left lateral scapular region, radial region of forearm, myofascial release to left upper extremity with passive stretching to improve pain free mobility in his left shoulder region.  begin left hand strengthening, and left forearm myofascial release.  Review plan of care.     Clinical Decision Making  Several treatment options, min-mod task modification necessary    OT Home Exercise Plan  09/27/17:  table slides, theraputty, tendon glides     Consulted and Agree with Plan of Care  Patient       Patient will benefit from skilled therapeutic intervention in order to improve the following deficits and impairments:  Pain, Decreased scar mobility, Increased fascial restrictions, Decreased range of motion, Decreased strength, Increased muscle  spasms, Impaired UE functional use  Visit Diagnosis: Stiffness of left shoulder, not elsewhere classified - Plan: Ot plan of care cert/re-cert  Acute pain of left shoulder - Plan: Ot plan of care cert/re-cert  Pain in joint of left hand - Plan: Ot plan of care cert/re-cert  Other symptoms and signs involving the musculoskeletal system - Plan: Ot plan of care cert/re-cert    Problem List Patient Active Problem List   Diagnosis Date Noted  . UTI (urinary tract infection) 04/20/2017  . UTI due to Klebsiella species 04/20/2017  . Essential hypertension 04/20/2017  . Chronic pancreatitis ([place]) 04/20/2017  . 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 Malnutrition of moderate degree 04/20/2017  . Complicated UTI (urinary tract infection)   . Carcinoma of buccal mucosa ([place]) 01/05/2017  . Rectal bleeding 09/28/2016  .  Hyponatremia 04/28/2015  . Abdominal pain 04/27/2015  . Acute pancreatitis 04/27/2015  . Chronic alcoholic pancreatitis ([place]) 04/27/2015  . ETOH abuse 04/27/2015  . DM type 2 (diabetes mellitus, type 2) ([place]) 04/27/2015  . Hypertension, uncontrolled 04/27/2015  . Hyperlipidemia 04/27/2015  . Hypertensive urgency 04/27/2015  . Alcohol abuse     Vangie Bicker, [place], OTR/L [phone]  09/27/2017, 2:58 PM  [facility] [address] [place], [place], [address] Phone: [phone]   Fax:  [phone]  Name: MOUSA PROUT MRN: [id number] Date of Birth: May 03, 1951

## 2017-09-27 NOTE — Patient Instructions (Signed)
SHOULDER: Flexion On Table   Place hands on table, elbows straight.Slide arms across table, leaning forward as able. 1-3 minutes 2-3 times per day Abduction (Passive)   With arm out to side, resting on table with palm down, slide arm across table, leaning towards arm, as able.  1-3 minutes 2-3 times per day.  Copyright  VHI. All rights reserved.     Internal Rotation (Assistive)   Seated with elbow bent at right angle and held against side, slide arm on table surface in an inward arc. Repeat __10__ times. Do ___3_ sessions per day. Activity: Use this motion to brush crumbs off the table.  Copyright  VHI. All rights reserved.    Home Exercises Program Theraputty Exercises  Do the following exercises 2 times a day using your affected hand.  1. Roll putty into a ball.  2. Make into a pancake.  3. Roll putty into a roll.  4. Pinch along log with first finger and thumb.   5. Make into a ball.  6. Roll it back into a log.   7. Pinch using thumb and side of first finger.  8. Roll into a ball, then flatten into a pancake.  9. Using your fingers, make putty into a mountain.

## 2017-09-28 ENCOUNTER — Ambulatory Visit (HOSPITAL_COMMUNITY): Payer: Medicare Other | Admitting: Specialist

## 2017-10-03 ENCOUNTER — Encounter (HOSPITAL_COMMUNITY): Payer: Self-pay

## 2017-10-03 ENCOUNTER — Other Ambulatory Visit: Payer: Self-pay

## 2017-10-03 ENCOUNTER — Ambulatory Visit (HOSPITAL_COMMUNITY): Payer: Medicare Other

## 2017-10-03 DIAGNOSIS — M25542 Pain in joints of left hand: Secondary | ICD-10-CM

## 2017-10-03 DIAGNOSIS — M25612 Stiffness of left shoulder, not elsewhere classified: Secondary | ICD-10-CM

## 2017-10-03 DIAGNOSIS — M25512 Pain in left shoulder: Secondary | ICD-10-CM

## 2017-10-03 DIAGNOSIS — R29898 Other symptoms and signs involving the musculoskeletal system: Secondary | ICD-10-CM

## 2017-10-03 NOTE — Patient Instructions (Signed)
Perform each exercise ____10-15____ reps. 2-3x days.   Protraction - STANDING  Start by holding a wand or cane at chest height.  Next, slowly push the wand outwards in front of your body so that your elbows become fully straightened. Then, return to the original position.     Shoulder FLEXION - STANDING - PALMS DOWN  In the standing position, hold a wand/cane with both arms, palms down on both sides. Raise up the wand/cane allowing your unaffected arm to perform most of the effort. Your affected arm should be partially relaxed.      Internal/External ROTATION - STANDING  In the standing position, hold a wand/cane with both hands keeping your elbows bent. Move your arms and wand/cane to one side.  Your affected arm should be partially relaxed while your unaffected arm performs most of the effort.       Shoulder ABDUCTION - STANDING  While holding a wand/cane palm face up on the injured side and palm face down on the uninjured side, slowly raise up your injured arm to the side.       Horizontal Abduction/Adduction      Straight arms holding cane at shoulder height, bring cane to right, center, left. Repeat starting to left.   Copyright  VHI. All rights reserved.       

## 2017-10-03 NOTE — Therapy (Signed)
[place], [place], [address] Phone: [phone]   Fax:  [phone]  Occupational Therapy Treatment  Patient Details  Name: Colin Wagner MRN: [id number] Date of Birth: August 03, 1951 Referring Provider: Dr. Sanjuana Kava   Encounter Date: 10/03/2017  OT End of Session - 10/03/17 1622    Visit Number  2    Number of Visits  16    Date for OT Re-Evaluation  11/26/17 mini reassess on 10/27/17    Authorization Type  Medicare    OT Start Time  1520    OT Stop Time  1602    OT Time Calculation (min)  42 min    Activity Tolerance  Patient tolerated treatment well    Behavior During Therapy  [facility] for tasks assessed/performed       Past Medical History:  Diagnosis Date  . Alcohol abuse   . Alcoholic pancreatitis 7124   admission  . Chronic back pain   . Chronic pancreatitis ([place])    based on ct findings 2016  . COPD (chronic obstructive pulmonary disease) ([place])   . Diabetes mellitus   . Diverticulosis   . Gastritis   . GERD (gastroesophageal reflux disease)   . Headache   . History of radiation therapy 05/12/17- 06/22/17   Left cheek and bilateral neck/ 60 Gy in 30 fractions to gross disease  . Hyperlipidemia   . Hypertension   . Peptic ulcer disease 1999   Per medical reports, no H pylori  . Renal cancer, left ([place]) 2012   he tells me that he has been released, ?and that he is free of cancer, and never had it to begin with.   . Right shoulder pain   . Testicular hypofunction     Past Surgical History:  Procedure Laterality Date  . COLONOSCOPY  2003   Dr. [name], polyps  . COLONOSCOPY  2005   Dr. [name], multiple diverticula  . COLONOSCOPY  2008   Dr. [name], diverticulosis  . COLONOSCOPY WITH PROPOFOL N/A 10/21/2016   Procedure: COLONOSCOPY WITH PROPOFOL;  Surgeon: [name], MD;  [facility];  Service: Endoscopy;  Laterality: N/A;  8:30 am  . ESOPHAGOGASTRODUODENOSCOPY      Multiple EGDs. 1999 EGD showed gastric ulcers, no H pylori and benign biopsies performed by Dr. [name]. 2001 gastric ulcer healed. Last EGD 2005 had gastritis.  . Left partial mandibulectomy, Scapular free flap reconstruction, selective neck dissection, tracheotomy, and resection of intraoral palate cancer. Left 03/01/2017   [facility]  . LUNG BIOPSY    . PARTIAL NEPHRECTOMY Left 2012  . POLYPECTOMY  10/21/2016   Procedure: POLYPECTOMY;  Surgeon: [name], MD;  [facility];  Service: Endoscopy;;  hepatic flexure x2    There were no vitals filed for this visit.  Subjective Assessment - 10/03/17 1552    Subjective   S: I don't know if this is doing any good.     Currently in Pain?  Yes    Pain Location  Shoulder    Pain Orientation  Left    Pain Descriptors / Indicators  Aching;Sharp    Pain Type  Chronic pain    Pain Radiating Towards  left shoulder down to wrist of hand.    Pain Onset  More than a month ago    Pain Frequency  Intermittent    Aggravating Factors   reaching above shoulder level  Pain Relieving Factors  heat    Effect of Pain on Daily Activities  moderate effect    Multiple Pain Sites  No         [facility] OT Assessment - 10/03/17 1553      Assessment   Medical Diagnosis  Left Adhesive Capsulitis and Carpal Tunnel Syndrome      Precautions   Precautions  Other (comment)    Precaution Comments  history of cancer               OT Treatments/Exercises (OP) - 10/03/17 1554      Exercises   Exercises  Shoulder      Shoulder Exercises: Supine   Protraction  PROM;5 reps;AAROM;10 reps    Horizontal ABduction  PROM;5 reps;AAROM;10 reps    External Rotation  PROM;5 reps;AAROM;10 reps    Internal Rotation  PROM;5 reps;AAROM;10 reps    Flexion  PROM;5 reps;AAROM;10 reps    ABduction  PROM;5 reps;AAROM;10 reps      Manual Therapy   Manual Therapy  Myofascial release;Joint mobilization;Other (comment)    Manual  therapy comments  manual therapy completed seperately from all other interventions this date     Joint Mobilization  Joint mobilization and stretch completed to left upper arm to increase ROM and mobilize soft tissue and joint.     Myofascial Release  myofascial release and scar tissue release to left upper arm, scapular, trapezius, and shoulder region to decrease pain and restrictions and improve pain free mobility needed to improve functional use of his left upper extremity.    Other Manual Therapy  Inferior glenohumeral joint/capsule mobilization and stretch, pectoralis minor release completed.              OT Education - 10/03/17 1557    Education provided  Yes    Education Details  Pt given OT evaluation handout. AA/ROM shoulder exercises    Person(s) Educated  Patient    Methods  Explanation;Demonstration;Verbal cues;Handout    Comprehension  Verbalized understanding;Returned demonstration       OT Short Term Goals - 10/03/17 1624      OT SHORT TERM GOAL #1   Title  Patient will be educated on a HEP for improved mobility and strength in his left shoulder and hand in order to reach into overhead cabinets at home.     Time  4    Period  Weeks    Status  On-going      OT SHORT TERM GOAL #2   Title  Patient will improve left shoulder P/ROM to [facility] in order to don and doff clothing with greater independence.     Time  4    Period  Weeks    Status  On-going      OT SHORT TERM GOAL #3   Title  Patient will improve left shoulder strength to 4/5 for greater ability to lift tools when working in his yard.     Time  4    Period  Weeks    Status  On-going      OT SHORT TERM GOAL #4   Title  Patient will imrove left grip strength by 15 pounds and pinch strength by 2 pounds for greater ability to open jars and containers.     Time  4    Period  Weeks    Status  On-going      OT SHORT TERM GOAL #5   Title  Patient will decrease pain in his left shoulder  and hand to 4/10 or  better when completing daily activities.     Time  4    Period  Weeks    Status  On-going      OT SHORT TERM GOAL #6   Title  Patient will decrease fascial and scar restrictions in left shoulder region from max to mod max for improved moblity needed to use left arm actively with daily activities.    Time  4    Period  Weeks    Status  On-going        OT Long Term Goals - 10/03/17 1624      OT LONG TERM GOAL #1   Title  Patient will use his left arm actively wtih all daily and leisure activities without hesitation or compensatory movements.     Time  8    Period  Weeks    Status  On-going      OT LONG TERM GOAL #2   Title  Patient will improve left shoulder A/ROM to [facility] in order to reach overhead when putting groceries away and retrieving items from cabinets.     Time  8    Period  Weeks    Status  On-going      OT LONG TERM GOAL #3   Title  Patient will improve left shoulder strength to 4+/5 or better for improved ability to lift gardening tools and bags of groceries.     Time  8    Period  Weeks    Status  On-going      OT LONG TERM GOAL #4   Title  Patient will improve left hand grip strength to 60# or more and pinch strength to 14 pounds or more for improved ability to maintain grip on household objects.     Time  8    Period  Weeks    Status  On-going      OT LONG TERM GOAL #5   Title  Patient will decrease pain in his left hand and shoulder to 2/10 or better when completing functional activities.     Time  8    Period  Weeks    Status  On-going      OT LONG TERM GOAL #6   Title  Patient will decrease scar restrictions and fascial restrictions in his left scapular and forearm regions for improved mobility required to complete ADLs.    Time  8    Period  Weeks    Status  On-going            Plan - 10/03/17 1622    Clinical Impression Statement  A: Patient was limited by pain a great deal during session and could not tolerate passive stretching past  initial pain threshold. Patient did better with AA/ROM supine and was able to tolerate more stretching when completed independently. HEP was updated to increase AA/ROM.     Plan  P: Start session with hand (grip and pinch strengthening). Attempt seated AA/ROM.     Consulted and Agree with Plan of Care  Patient       Patient will benefit from skilled therapeutic intervention in order to improve the following deficits and impairments:  Pain, Decreased scar mobility, Increased fascial restrictions, Decreased range of motion, Decreased strength, Increased muscle spasms, Impaired UE functional use  Visit Diagnosis: Stiffness of left shoulder, not elsewhere classified  Acute pain of left shoulder  Other symptoms and signs involving the musculoskeletal system  Pain in joint of  left hand    Problem List Patient Active Problem List   Diagnosis Date Noted  . UTI (urinary tract infection) 04/20/2017  . UTI due to Klebsiella species 04/20/2017  . Essential hypertension 04/20/2017  . Chronic pancreatitis ([place]) 04/20/2017  . 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 Malnutrition of moderate degree 04/20/2017  . Complicated UTI (urinary tract infection)   . 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 Rectal bleeding 09/28/2016  . Hyponatremia 04/28/2015  . Abdominal pain 04/27/2015  . Acute pancreatitis 04/27/2015  . Chronic alcoholic pancreatitis ([place]) 04/27/2015  . ETOH abuse 04/27/2015  . DM type 2 (diabetes mellitus, type 2) ([place]) 04/27/2015  . Hypertension, uncontrolled 04/27/2015  . Hyperlipidemia 04/27/2015  . Hypertensive urgency 04/27/2015  . Alcohol abuse    Ailene Ravel, OTR/L,CBIS  [phone]  10/03/2017, 4:25 PM  [place] [address] [place], [place], [address] Phone: [phone]   Fax:  [phone]  Name: Colin Wagner MRN: [id number] Date of Birth: 05-Feb-1951

## 2017-10-05 ENCOUNTER — Encounter: Payer: Self-pay | Admitting: Orthopaedic Surgery

## 2017-10-05 ENCOUNTER — Ambulatory Visit (INDEPENDENT_AMBULATORY_CARE_PROVIDER_SITE_OTHER): Payer: Medicare Other | Admitting: Orthopaedic Surgery

## 2017-10-05 ENCOUNTER — Encounter (HOSPITAL_COMMUNITY): Payer: Self-pay

## 2017-10-05 ENCOUNTER — Ambulatory Visit (HOSPITAL_COMMUNITY): Payer: Medicare Other

## 2017-10-05 ENCOUNTER — Other Ambulatory Visit: Payer: Self-pay

## 2017-10-05 VITALS — BP 134/85 | HR 81 | Temp 97.4°F | Ht 71.0 in | Wt 182.0 lb

## 2017-10-05 DIAGNOSIS — M25612 Stiffness of left shoulder, not elsewhere classified: Secondary | ICD-10-CM | POA: Diagnosis not present

## 2017-10-05 DIAGNOSIS — M7502 Adhesive capsulitis of left shoulder: Secondary | ICD-10-CM | POA: Diagnosis not present

## 2017-10-05 DIAGNOSIS — M25542 Pain in joints of left hand: Secondary | ICD-10-CM

## 2017-10-05 DIAGNOSIS — R29898 Other symptoms and signs involving the musculoskeletal system: Secondary | ICD-10-CM

## 2017-10-05 DIAGNOSIS — M25512 Pain in left shoulder: Secondary | ICD-10-CM

## 2017-10-05 NOTE — Therapy (Signed)
[place], [place], [address] Phone: [phone]   Fax:  [phone]  Occupational Therapy Treatment  Patient Details  Name: Colin Wagner MRN: [id number] Date of Birth: Mar 07, 1951 Referring Provider: Dr. Sanjuana Kava   Encounter Date: 10/05/2017  OT End of Session - 10/05/17 1718    Visit Number  3    Number of Visits  16    Date for OT Re-Evaluation  11/26/17 mini reassess on 10/27/17    Authorization Type  Medicare    OT Start Time  1520    OT Stop Time  1600    OT Time Calculation (min)  40 min    Activity Tolerance  Patient tolerated treatment well    Behavior During Therapy  [facility] for tasks assessed/performed       Past Medical History:  Diagnosis Date  . Alcohol abuse   . Alcoholic pancreatitis 2197   admission  . Chronic back pain   . Chronic pancreatitis ([place])    based on ct findings 2016  . COPD (chronic obstructive pulmonary disease) ([place])   . Diabetes mellitus   . Diverticulosis   . Gastritis   . GERD (gastroesophageal reflux disease)   . Headache   . History of radiation therapy 05/12/17- 06/22/17   Left cheek and bilateral neck/ 60 Gy in 30 fractions to gross disease  . Hyperlipidemia   . Hypertension   . Peptic ulcer disease 1999   Per medical reports, no H pylori  . Renal cancer, left ([place]) 2012   he tells me that he has been released, ?and that he is free of cancer, and never had it to begin with.   . Right shoulder pain   . Testicular hypofunction     Past Surgical History:  Procedure Laterality Date  . COLONOSCOPY  2003   Dr. [name], polyps  . COLONOSCOPY  2005   Dr. [name], multiple diverticula  . COLONOSCOPY  2008   Dr. [name], diverticulosis  . COLONOSCOPY WITH PROPOFOL N/A 10/21/2016   Procedure: COLONOSCOPY WITH PROPOFOL;  Surgeon: [name], MD;  [facility];  Service: Endoscopy;  Laterality: N/A;  8:30 am  . ESOPHAGOGASTRODUODENOSCOPY     Multiple EGDs. 1999 EGD showed gastric ulcers, no H pylori and benign biopsies performed by Dr. [name]. 2001 gastric ulcer healed. Last EGD 2005 had gastritis.  . Left partial mandibulectomy, Scapular free flap reconstruction, selective neck dissection, tracheotomy, and resection of intraoral palate cancer. Left 03/01/2017   [facility]  . LUNG BIOPSY    . PARTIAL NEPHRECTOMY Left 2012  . POLYPECTOMY  10/21/2016   Procedure: POLYPECTOMY;  Surgeon: [name], MD;  [facility];  Service: Endoscopy;;  hepatic flexure x2    There were no vitals filed for this visit.  Subjective Assessment - 10/05/17 1557    Subjective   S: I feel like I can move my arm a little better.     Currently in Pain?  Yes    Pain Score  7     Pain Location  Shoulder    Pain Orientation  Left    Pain Descriptors / Indicators  Aching;Sharp    Pain Type  Chronic pain         [facility] OT Assessment - 10/05/17 1711      Assessment   Medical Diagnosis  Left Adhesive Capsulitis and Carpal Tunnel Syndrome  Precautions   Precautions  Other (comment)    Precaution Comments  history of cancer               OT Treatments/Exercises (OP) - 10/05/17 1711      Exercises   Exercises  Shoulder;Hand      Shoulder Exercises: Supine   Protraction  PROM;5 reps    Horizontal ABduction  PROM;5 reps    External Rotation  PROM;5 reps    Internal Rotation  PROM;5 reps    Flexion  PROM;5 reps    ABduction  PROM;5 reps      Shoulder Exercises: Seated   Protraction  AAROM;10 reps    Horizontal ABduction  AAROM;10 reps    External Rotation  AAROM;10 reps    Internal Rotation  AAROM;10 reps    Flexion  AAROM;10 reps    Abduction  AAROM;10 reps      Hand Exercises   Hand Gripper with Large Beads  all beads with gripper set at 37#    Hand Gripper with Medium Beads  6 beads with gripper set at 37#    Hand Gripper with Small Beads  2/6 beads with gripper set at 37#       Manual Therapy   Manual Therapy  Myofascial release;Joint mobilization    Manual therapy comments  manual therapy completed seperately from all other interventions this date     Joint Mobilization  Joint mobilization and stretch completed to left upper arm to increase ROM and mobilize soft tissue and joint.     Myofascial Release  myofascial release and scar tissue release to left upper arm, scapular, trapezius, and shoulder region to decrease pain and restrictions and improve pain free mobility needed to improve functional use of his left upper extremity.               OT Short Term Goals - 10/03/17 1624      OT SHORT TERM GOAL #1   Title  Patient will be educated on a HEP for improved mobility and strength in his left shoulder and hand in order to reach into overhead cabinets at home.     Time  4    Period  Weeks    Status  On-going      OT SHORT TERM GOAL #2   Title  Patient will improve left shoulder P/ROM to [facility] in order to don and doff clothing with greater independence.     Time  4    Period  Weeks    Status  On-going      OT SHORT TERM GOAL #3   Title  Patient will improve left shoulder strength to 4/5 for greater ability to lift tools when working in his yard.     Time  4    Period  Weeks    Status  On-going      OT SHORT TERM GOAL #4   Title  Patient will imrove left grip strength by 15 pounds and pinch strength by 2 pounds for greater ability to open jars and containers.     Time  4    Period  Weeks    Status  On-going      OT SHORT TERM GOAL #5   Title  Patient will decrease pain in his left shoulder and hand to 4/10 or better when completing daily activities.     Time  4    Period  Weeks    Status  On-going  OT SHORT TERM GOAL #6   Title  Patient will decrease fascial and scar restrictions in left shoulder region from max to mod max for improved moblity needed to use left arm actively with daily activities.    Time  4    Period  Weeks    Status   On-going        OT Long Term Goals - 10/03/17 1624      OT LONG TERM GOAL #1   Title  Patient will use his left arm actively wtih all daily and leisure activities without hesitation or compensatory movements.     Time  8    Period  Weeks    Status  On-going      OT LONG TERM GOAL #2   Title  Patient will improve left shoulder A/ROM to [facility] in order to reach overhead when putting groceries away and retrieving items from cabinets.     Time  8    Period  Weeks    Status  On-going      OT LONG TERM GOAL #3   Title  Patient will improve left shoulder strength to 4+/5 or better for improved ability to lift gardening tools and bags of groceries.     Time  8    Period  Weeks    Status  On-going      OT LONG TERM GOAL #4   Title  Patient will improve left hand grip strength to 60# or more and pinch strength to 14 pounds or more for improved ability to maintain grip on household objects.     Time  8    Period  Weeks    Status  On-going      OT LONG TERM GOAL #5   Title  Patient will decrease pain in his left hand and shoulder to 2/10 or better when completing functional activities.     Time  8    Period  Weeks    Status  On-going      OT LONG TERM GOAL #6   Title  Patient will decrease scar restrictions and fascial restrictions in his left scapular and forearm regions for improved mobility required to complete ADLs.    Time  8    Period  Weeks    Status  On-going            Plan - 10/05/17 1719    Clinical Impression Statement  A: Pt reports that he thinks he is able to raise his arm up further than last session. Progressed to AA/ROM seated. Pt was educated to complete HEP AA/ROM exercises seated at home also. VC for form and technique.    Plan  P: Continue with grip strengthening attempting to complete 6 small beads with hand gripper.     Consulted and Agree with Plan of Care  Patient       Patient will benefit from skilled therapeutic intervention in order to improve  the following deficits and impairments:  Pain, Decreased scar mobility, Increased fascial restrictions, Decreased range of motion, Decreased strength, Increased muscle spasms, Impaired UE functional use  Visit Diagnosis: Stiffness of left shoulder, not elsewhere classified  Acute pain of left shoulder  Other symptoms and signs involving the musculoskeletal system  Pain in joint of left hand    Problem List Patient Active Problem List   Diagnosis Date Noted  . UTI (urinary tract infection) 04/20/2017  . UTI due to Klebsiella species 04/20/2017  . Essential hypertension 04/20/2017  .  Chronic pancreatitis ([place]) 04/20/2017  . 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 Malnutrition of moderate degree 04/20/2017  . Complicated UTI (urinary tract infection)   . 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 Rectal bleeding 09/28/2016  . Hyponatremia 04/28/2015  . Abdominal pain 04/27/2015  . Acute pancreatitis 04/27/2015  . Chronic alcoholic pancreatitis ([place]) 04/27/2015  . ETOH abuse 04/27/2015  . DM type 2 (diabetes mellitus, type 2) ([place]) 04/27/2015  . Hypertension, uncontrolled 04/27/2015  . Hyperlipidemia 04/27/2015  . Hypertensive urgency 04/27/2015  . Alcohol abuse    Ailene Ravel, OTR/L,CBIS  [phone]  10/05/2017, 5:36 PM  [place] [address] [place], [place], [address] Phone: [phone]   Fax:  [phone]  Name: DIONICIO SHELNUTT MRN: [id number] Date of Birth: 1950/10/20

## 2017-10-05 NOTE — Progress Notes (Signed)
Patient Colin Wagner, male DOB:Apr 24, 1951, 67 y.o. VQM:[id number]  Chief Complaint  Patient presents with  . Shoulder Pain    left  . Results    review EMG/ NCV    HPI  Colin Wagner is a 67 y.o. male who has chronic left shoulder pain.  He has been going to OT and is improving.  I have read their notes.  He had EMG of the upper extremities and I was concerned about a carpal tunnel on the left.  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 There is possible superimposed left ulnar neuropathy.  I have explained findings. I do not feel he needs surgery on this.    He is to continue his OT. HPI  Body mass index is 25.38 kg/m.  ROS  Review of Systems  HENT: Positive for facial swelling.   Musculoskeletal: Positive for arthralgias and back pain.  Neurological: Positive for speech difficulty.  All other systems reviewed and are negative.   Past Medical History:  Diagnosis Date  . Alcohol abuse   . Alcoholic pancreatitis 9326   admission  . Chronic back pain   . Chronic pancreatitis ([place])    based on ct findings 2016  . COPD (chronic obstructive pulmonary disease) ([place])   . Diabetes mellitus   . Diverticulosis   . Gastritis   . GERD (gastroesophageal reflux disease)   . Headache   . History of radiation therapy 05/12/17- 06/22/17   Left cheek and bilateral neck/ 60 Gy in 30 fractions to gross disease  . Hyperlipidemia   . Hypertension   . Peptic ulcer disease 1999   Per medical reports, no H pylori  . Renal cancer, left ([place]) 2012   he tells me that he has been released, ?and that he is free of cancer, and never had it to begin with.   . Right shoulder pain   . Testicular hypofunction     Past Surgical History:  Procedure Laterality Date  . COLONOSCOPY  2003   Dr. [name], polyps  . COLONOSCOPY  2005   Dr. [name], multiple diverticula   . COLONOSCOPY  2008   Dr. [name], diverticulosis  . COLONOSCOPY WITH PROPOFOL N/A 10/21/2016   Procedure: COLONOSCOPY WITH PROPOFOL;  Surgeon: [name], MD;  [facility];  Service: Endoscopy;  Laterality: N/A;  8:30 am  . ESOPHAGOGASTRODUODENOSCOPY     Multiple EGDs. 1999 EGD showed gastric ulcers, no H pylori and benign biopsies performed by Dr. [name]. 2001 gastric ulcer healed. Last EGD 2005 had gastritis.  . Left partial mandibulectomy, Scapular free flap reconstruction, selective neck dissection, tracheotomy, and resection of intraoral palate cancer. Left 03/01/2017   [facility]  . LUNG BIOPSY    . PARTIAL NEPHRECTOMY Left 2012  . POLYPECTOMY  10/21/2016   Procedure: POLYPECTOMY;  Surgeon: [name], MD;  [facility];  Service: Endoscopy;;  hepatic flexure x2    Family History  Problem Relation Age of Onset  . Hypertension Mother   . Colon cancer Neg Hx     Social History Social History   Tobacco Use  . Smoking status: Former Smoker    Packs/day: 0.50    Years: 35.00    Pack years: 17.50  . Smokeless tobacco: Never Used  . Tobacco comment: Only smoking a few cigarettes now  Substance Use Topics  . Alcohol  use: No    Comment: quit 2000 but relapse in 2016. no etoh since hospitalized 2016.   . Drug use: No    No Known Allergies  Current Outpatient Medications  Medication Sig Dispense Refill  . Acetaminophen (TYLENOL 8 HOUR PO) 2 tablets by Feeding Tube route 2 (two) times daily as needed.     Marland Kitchen albuterol (PROVENTIL HFA;VENTOLIN HFA) 108 (90 Base) MCG/ACT inhaler Inhale 1-2 puffs into the lungs every 6 (six) hours as needed for wheezing or shortness of breath. 1 Inhaler 0  . atorvastatin (LIPITOR) 40 MG tablet Take 40 mg by mouth daily at 6 PM.     . B-D UF III MINI PEN NEEDLES 31G X 5 MM MISC   3  . B-D ULTRAFINE III SHORT PEN 31G X 8 MM MISC     . emollient (BIAFINE) cream Apply topically as needed. 454 g  0  . HYDROcodone-acetaminophen (NORCO) 7.5-325 MG tablet Take 1 tablet by mouth every 6 (six) hours as needed (pain). (Patient taking differently: 1 tablet every 6 (six) hours as needed (pain). Per feeding tube) 60 tablet 0  . ibuprofen (ADVIL,MOTRIN) 100 MG/5ML suspension Place 20 mLs (400 mg total) into feeding tube every 6 (six) hours as needed. Take with food. 473 mL 4  . insulin aspart (NOVOLOG) 100 UNIT/ML injection Inject 0-9 Units into the skin every 4 (four) hours. 70-120--No units; 121 to 150--1 unit; 151 to 200--2 units; 201 to 250--3 units; 251 to 300--5 units; 301 to 350--7 units; 351 to 400--9 units; >400--call MD 10 mL 0  . LANTUS 100 UNIT/ML injection Inject 25 Units into the skin every evening.   1  . lidocaine (XYLOCAINE) 2 % solution MIX 1 PART SOLUTION & 1 PART WATER SWISH & SWALLOW 10MLS OF MIXTURE 30 MINS BEFORE MEALS UP TO 4/DAY 100 mL 0  . lisinopril (PRINIVIL,ZESTRIL) 5 MG tablet Take 5 mg by mouth daily.     Marland Kitchen loratadine (CLARITIN) 10 MG tablet Take 10 mg by mouth daily.    Marland Kitchen LORazepam (ATIVAN) 1 MG tablet Take 1 mg by mouth 4 (four) times daily as needed for anxiety. For anxiety  5  . pantoprazole (PROTONIX) 20 MG tablet 20 mg daily. Per feeding tube    . senna (SENOKOT) 8.6 MG TABS tablet Take 1 tablet (8.6 mg total) by mouth at bedtime as needed for mild constipation. May be needed as pain medication can constipate. (Patient taking differently: Place 1 tablet into feeding tube at bedtime as needed for mild constipation. May be needed as pain medication can constipate.) 120 each 1  . tamsulosin (FLOMAX) 0.4 MG CAPS capsule TAKE ONE CAPSULE BY MOUTH EVERY DAY FOR PROSTATE  3  . testosterone cypionate (DEPOTESTOSTERONE CYPIONATE) 200 MG/ML injection Inject 100 mg into the muscle every 14 (fourteen) days.     . traZODone (DESYREL) 50 MG tablet Take 50 mg by mouth at bedtime.    . Water For Irrigation, Sterile (FREE WATER) SOLN Place 100 mLs into feeding tube every 8 (eight)  hours. 1000 mL 0   No current facility-administered medications for this visit.      Physical Exam  Blood pressure 134/85, pulse 81, temperature (!) 97.4 F (36.3 C), height 5\' 11"  (1.803 m), weight 182 lb (82.6 kg).  Constitutional: overall normal hygiene, normal nutrition, well developed, normal grooming, normal body habitus. Assistive device:none  Musculoskeletal: gait and station Limp none, muscle tone and strength are normal, no tremors or atrophy is present.  Marland Kitchen  Neurological: coordination overall normal.  Deep tendon reflex/nerve stretch intact.  Sensation normal.  Cranial nerves II-XII intact.   Skin:   Normal overall no scars, lesions, ulcers or rashes. No psoriasis.  Psychiatric: Alert and oriented x 3.  Recent memory intact, remote memory unclear.  Normal mood and affect. Well groomed.  Good eye contact.  Cardiovascular: overall no swelling, no varicosities, no edema bilaterally, normal temperatures of the legs and arms, no clubbing, cyanosis and good capillary refill.  Lymphatic: palpation is normal.  Examination of left Upper Extremity is done.  Inspection:   Overall:  Elbow non-tender without crepitus or defects, forearm non-tender without crepitus or defects, wrist non-tender without crepitus or defects, hand non-tender.    Shoulder: with glenohumeral joint tenderness, without effusion.   Upper arm: without swelling and tenderness   Range of motion:   Overall:  Full range of motion of the elbow, full range of motion of wrist and full range of motion in fingers.   Shoulder:  left  165 degrees forward flexion; 150 degrees abduction; 35 degrees internal rotation, 35 degrees external rotation, 15 degrees extension, 40 degrees adduction.   Stability:   Overall:  Shoulder, elbow and wrist stable   Strength and Tone:   Overall full shoulder muscles strength, full upper arm strength and normal upper arm bulk and tone.  All other systems reviewed and are negative    The patient has been educated about the nature of the problem(s) and counseled on treatment options.  The patient appeared to understand what I have discussed and is in agreement with it.  Encounter Diagnoses  Name Primary?  . Pain in joint of left shoulder Yes  . Adhesive capsulitis of left shoulder     PLAN Call if any problems.  Precautions discussed.  Continue current medications.   Return to clinic 1 month I told him I will not be here and Dr. Aline Brochure will see him.   Electronically Signed Sanjuana Kava, MD 2/13/20192:43 PM

## 2017-10-06 ENCOUNTER — Ambulatory Visit (HOSPITAL_COMMUNITY)
Admission: RE | Admit: 2017-10-06 | Discharge: 2017-10-06 | Disposition: A | Discharge status: Home / Self Care | Payer: Medicare Other | Source: Ambulatory Visit | Attending: Radiation Oncology | Admitting: Radiation Oncology

## 2017-10-06 DIAGNOSIS — I2510 Atherosclerotic heart disease of native coronary artery without angina pectoris: Secondary | ICD-10-CM | POA: Insufficient documentation

## 2017-10-06 DIAGNOSIS — I700 Atherosclerosis of aorta: Secondary | ICD-10-CM | POA: Diagnosis not present

## 2017-10-06 DIAGNOSIS — I6521 Occlusion and stenosis of right carotid artery: Secondary | ICD-10-CM | POA: Insufficient documentation

## 2017-10-06 DIAGNOSIS — C411 Malignant neoplasm of mandible: Secondary | ICD-10-CM | POA: Diagnosis not present

## 2017-10-06 LAB — POCT I-STAT CREATININE: Creatinine, Ser: 1.2 mg/dL (ref 0.61–1.24)

## 2017-10-06 MED ORDER — IOPAMIDOL (ISOVUE-300) INJECTION 61%
125.0000 mL | Freq: Once | INTRAVENOUS | Status: AC | PRN
  Administered 2017-10-06: 125 mL via INTRAVENOUS

## 2017-10-07 ENCOUNTER — Ambulatory Visit (HOSPITAL_COMMUNITY): Payer: Medicare Other

## 2017-10-07 ENCOUNTER — Other Ambulatory Visit: Payer: Self-pay

## 2017-10-07 ENCOUNTER — Encounter (HOSPITAL_COMMUNITY): Payer: Self-pay

## 2017-10-07 DIAGNOSIS — R29898 Other symptoms and signs involving the musculoskeletal system: Secondary | ICD-10-CM

## 2017-10-07 DIAGNOSIS — M25612 Stiffness of left shoulder, not elsewhere classified: Secondary | ICD-10-CM

## 2017-10-07 DIAGNOSIS — M25512 Pain in left shoulder: Secondary | ICD-10-CM

## 2017-10-07 DIAGNOSIS — M25542 Pain in joints of left hand: Secondary | ICD-10-CM

## 2017-10-07 NOTE — Therapy (Signed)
[facility] [place], [place], [address] Phone: [phone]   Fax:  [phone]  Occupational Therapy Treatment  Patient Details  Name: Colin Wagner MRN: [id number] Date of Birth: 06-30-1951 Referring Provider: Dr. Sanjuana Kava   Encounter Date: 10/07/2017  OT End of Session - 10/07/17 1308    Visit Number  4    Number of Visits  16    Date for OT Re-Evaluation  11/26/17 mini reassess on 10/27/17    Authorization Type  Medicare    OT Start Time  1035    OT Stop Time  1115    OT Time Calculation (min)  40 min    Activity Tolerance  Patient tolerated treatment well    Behavior During Therapy  [facility] for tasks assessed/performed       Past Medical History:  Diagnosis Date  . Alcohol abuse   . Alcoholic pancreatitis 8756   admission  . Chronic back pain   . Chronic pancreatitis ([place])    based on ct findings 2016  . COPD (chronic obstructive pulmonary disease) ([place])   . Diabetes mellitus   . Diverticulosis   . Gastritis   . GERD (gastroesophageal reflux disease)   . Headache   . History of radiation therapy 05/12/17- 06/22/17   Left cheek and bilateral neck/ 60 Gy in 30 fractions to gross disease  . Hyperlipidemia   . Hypertension   . Peptic ulcer disease 1999   Per medical reports, no H pylori  . Renal cancer, left ([place]) 2012   he tells me that he has been released, ?and that he is free of cancer, and never had it to begin with.   . Right shoulder pain   . Testicular hypofunction     Past Surgical History:  Procedure Laterality Date  . COLONOSCOPY  2003   Dr. [name], polyps  . COLONOSCOPY  2005   Dr. [name], multiple diverticula  . COLONOSCOPY  2008   Dr. [name], diverticulosis  . COLONOSCOPY WITH PROPOFOL N/A 10/21/2016   Procedure: COLONOSCOPY WITH PROPOFOL;  Surgeon: [name], MD;  [facility];  Service: Endoscopy;  Laterality: N/A;  8:30 am  . ESOPHAGOGASTRODUODENOSCOPY      Multiple EGDs. 1999 EGD showed gastric ulcers, no H pylori and benign biopsies performed by Dr. [name]. 2001 gastric ulcer healed. Last EGD 2005 had gastritis.  . Left partial mandibulectomy, Scapular free flap reconstruction, selective neck dissection, tracheotomy, and resection of intraoral palate cancer. Left 03/01/2017   [facility]  . LUNG BIOPSY    . PARTIAL NEPHRECTOMY Left 2012  . POLYPECTOMY  10/21/2016   Procedure: POLYPECTOMY;  Surgeon: [name], MD;  [facility];  Service: Endoscopy;;  hepatic flexure x2    There were no vitals filed for this visit.  Subjective Assessment - 10/07/17 1302    Subjective   S: It doesn't hurt right now but it will change when you start working on it.    Currently in Pain?  No/denies         [facility] OT Assessment - 10/07/17 1303      Assessment   Medical Diagnosis  Left Adhesive Capsulitis and Carpal Tunnel Syndrome      Precautions   Precautions  Other (comment)    Precaution Comments  history of cancer               OT Treatments/Exercises (OP) -  10/07/17 1303      Exercises   Exercises  Shoulder;Hand      Shoulder Exercises: Supine   Protraction  PROM;5 reps    Horizontal ABduction  PROM;5 reps    External Rotation  PROM;5 reps    Internal Rotation  PROM;5 reps    Flexion  PROM;5 reps    ABduction  PROM;5 reps      Shoulder Exercises: Seated   Protraction  AAROM;10 reps    Horizontal ABduction  AAROM;10 reps    External Rotation  AAROM;10 reps    Internal Rotation  AAROM;10 reps    Flexion  AAROM;10 reps    Abduction  AAROM;10 reps      Shoulder Exercises: ROM/Strengthening   UBE (Upper Arm Bike)  Level 1 3' forward 3' reverse      Hand Exercises   Hand Gripper with Small Beads  6 beads with gripper set at 35#      Manual Therapy   Manual Therapy  Myofascial release;Joint mobilization    Manual therapy comments  manual therapy completed seperately from all other  interventions this date     Joint Mobilization  Joint mobilization and stretch completed to left upper arm to increase ROM and mobilize soft tissue and joint.     Myofascial Release  myofascial release and scar tissue release to left upper arm, scapular, trapezius, and shoulder region to decrease pain and restrictions and improve pain free mobility needed to improve functional use of his left upper extremity.    Other Manual Therapy  Posterior glenohumeral joint mobilzation and capsule stretch                OT Short Term Goals - 10/03/17 1624      OT SHORT TERM GOAL #1   Title  Patient will be educated on a HEP for improved mobility and strength in his left shoulder and hand in order to reach into overhead cabinets at home.     Time  4    Period  Weeks    Status  On-going      OT SHORT TERM GOAL #2   Title  Patient will improve left shoulder P/ROM to [facility] in order to don and doff clothing with greater independence.     Time  4    Period  Weeks    Status  On-going      OT SHORT TERM GOAL #3   Title  Patient will improve left shoulder strength to 4/5 for greater ability to lift tools when working in his yard.     Time  4    Period  Weeks    Status  On-going      OT SHORT TERM GOAL #4   Title  Patient will imrove left grip strength by 15 pounds and pinch strength by 2 pounds for greater ability to open jars and containers.     Time  4    Period  Weeks    Status  On-going      OT SHORT TERM GOAL #5   Title  Patient will decrease pain in his left shoulder and hand to 4/10 or better when completing daily activities.     Time  4    Period  Weeks    Status  On-going      OT SHORT TERM GOAL #6   Title  Patient will decrease fascial and scar restrictions in left shoulder region from max to mod max for improved moblity needed  to use left arm actively with daily activities.    Time  4    Period  Weeks    Status  On-going        OT Long Term Goals - 10/03/17 1624      OT  LONG TERM GOAL #1   Title  Patient will use his left arm actively wtih all daily and leisure activities without hesitation or compensatory movements.     Time  8    Period  Weeks    Status  On-going      OT LONG TERM GOAL #2   Title  Patient will improve left shoulder A/ROM to [facility] in order to reach overhead when putting groceries away and retrieving items from cabinets.     Time  8    Period  Weeks    Status  On-going      OT LONG TERM GOAL #3   Title  Patient will improve left shoulder strength to 4+/5 or better for improved ability to lift gardening tools and bags of groceries.     Time  8    Period  Weeks    Status  On-going      OT LONG TERM GOAL #4   Title  Patient will improve left hand grip strength to 60# or more and pinch strength to 14 pounds or more for improved ability to maintain grip on household objects.     Time  8    Period  Weeks    Status  On-going      OT LONG TERM GOAL #5   Title  Patient will decrease pain in his left hand and shoulder to 2/10 or better when completing functional activities.     Time  8    Period  Weeks    Status  On-going      OT LONG TERM GOAL #6   Title  Patient will decrease scar restrictions and fascial restrictions in his left scapular and forearm regions for improved mobility required to complete ADLs.    Time  8    Period  Weeks    Status  On-going            Plan - 10/07/17 1308    Clinical Impression Statement  A: Pt unable to complete any small beads with gripper set to 37# required a decrease to 35# for completion. Able to achieve close to 50% range for shoulder flexion and abduction during passive stretching although during AA/ROM he is able to go further. VC for form and technique were given.     Plan  P: Add PVC pipe slide and wall wash.     Consulted and Agree with Plan of Care  Patient       Patient will benefit from skilled therapeutic intervention in order to improve the following deficits and impairments:      Visit Diagnosis: Stiffness of left shoulder, not elsewhere classified  Acute pain of left shoulder  Other symptoms and signs involving the musculoskeletal system  Pain in joint of left hand    Problem List Patient Active Problem List   Diagnosis Date Noted  . UTI (urinary tract infection) 04/20/2017  . UTI due to Klebsiella species 04/20/2017  . Essential hypertension 04/20/2017  . Chronic pancreatitis ([place]) 04/20/2017  . 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 Malnutrition of moderate degree 04/20/2017  . Complicated UTI (urinary tract infection)   . 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 Rectal  bleeding 09/28/2016  . Hyponatremia 04/28/2015  . Abdominal pain 04/27/2015  . Acute pancreatitis 04/27/2015  . Chronic alcoholic pancreatitis ([place]) 04/27/2015  . ETOH abuse 04/27/2015  . DM type 2 (diabetes mellitus, type 2) ([place]) 04/27/2015  . Hypertension, uncontrolled 04/27/2015  . Hyperlipidemia 04/27/2015  . Hypertensive urgency 04/27/2015  . Alcohol abuse    Ailene Ravel, OTR/L,CBIS  [id number]  10/07/2017, 1:10 PM  [facility] [address] [place], [place], [address] Phone: [phone]   Fax:  [phone]  Name: TEX CONROY MRN: [id number] Date of Birth: 03-21-1951

## 2017-10-07 NOTE — Progress Notes (Signed)
Mr. Roessner presents for follow up of radiation completed 06/22/2017 to his Left Cheek and bilateral neck.   Pain issues, if any: He denies Using a feeding tube?: Yes, he is instilling 4-6 cans of glucerna daily depending on oral intake.  Weight changes, if any:  Wt Readings from Last 3 Encounters:  10/14/17 181 lb 6.4 oz (82.3 kg)  10/05/17 182 lb (82.6 kg)  09/21/17 180 lb (81.6 kg)   Swallowing issues, if any: He denies problems swallowing, but has difficulty manipulating food in his mouth. He is eating softer foods such as eggs, oatmeal, and macaroni cheese. He continues to report dry mouth.  Smoking or chewing tobacco? No Using fluoride trays daily? No Last ENT visit was on: Dr. Vertell Limber ENT/ head and neck surgery at [facility] on 09/05/17 Other notable issues, if any:  10/06/17 CT neck/ chest

## 2017-10-12 ENCOUNTER — Encounter (HOSPITAL_COMMUNITY): Payer: Self-pay

## 2017-10-12 ENCOUNTER — Other Ambulatory Visit: Payer: Self-pay

## 2017-10-12 ENCOUNTER — Ambulatory Visit (HOSPITAL_COMMUNITY): Payer: Medicare Other

## 2017-10-12 DIAGNOSIS — M25612 Stiffness of left shoulder, not elsewhere classified: Secondary | ICD-10-CM

## 2017-10-12 DIAGNOSIS — M25512 Pain in left shoulder: Secondary | ICD-10-CM

## 2017-10-12 DIAGNOSIS — M25542 Pain in joints of left hand: Secondary | ICD-10-CM

## 2017-10-12 DIAGNOSIS — R29898 Other symptoms and signs involving the musculoskeletal system: Secondary | ICD-10-CM

## 2017-10-12 NOTE — Patient Instructions (Signed)

## 2017-10-12 NOTE — Therapy (Signed)
[place], [place], [address] Phone: [phone]   Fax:  [phone]  Occupational Therapy Treatment  Patient Details  Name: Colin Wagner MRN: [id number] Date of Birth: 11/16/1950 Referring Provider: Dr. Sanjuana Kava   Encounter Date: 10/12/2017  OT End of Session - 10/12/17 1714    Visit Number  5    Number of Visits  16    Date for OT Re-Evaluation  11/26/17 mini reassess on 10/27/17    Authorization Type  Medicare    OT Start Time  1520    OT Stop Time  1600    OT Time Calculation (min)  40 min    Activity Tolerance  Patient tolerated treatment well    Behavior During Therapy  [facility] for tasks assessed/performed       Past Medical History:  Diagnosis Date  . Alcohol abuse   . Alcoholic pancreatitis 7989   admission  . Chronic back pain   . Chronic pancreatitis ([place])    based on ct findings 2016  . COPD (chronic obstructive pulmonary disease) ([place])   . Diabetes mellitus   . Diverticulosis   . Gastritis   . GERD (gastroesophageal reflux disease)   . Headache   . History of radiation therapy 05/12/17- 06/22/17   Left cheek and bilateral neck/ 60 Gy in 30 fractions to gross disease  . Hyperlipidemia   . Hypertension   . Peptic ulcer disease 1999   Per medical reports, no H pylori  . Renal cancer, left ([place]) 2012   he tells me that he has been released, ?and that he is free of cancer, and never had it to begin with.   . Right shoulder pain   . Testicular hypofunction     Past Surgical History:  Procedure Laterality Date  . COLONOSCOPY  2003   Dr. [name], polyps  . COLONOSCOPY  2005   Dr. [name], multiple diverticula  . COLONOSCOPY  2008   Dr. [name], diverticulosis  . COLONOSCOPY WITH PROPOFOL N/A 10/21/2016   Procedure: COLONOSCOPY WITH PROPOFOL;  Surgeon: [name], MD;  [facility];  Service: Endoscopy;  Laterality: N/A;  8:30 am  . ESOPHAGOGASTRODUODENOSCOPY      Multiple EGDs. 1999 EGD showed gastric ulcers, no H pylori and benign biopsies performed by Dr. [name]. 2001 gastric ulcer healed. Last EGD 2005 had gastritis.  . Left partial mandibulectomy, Scapular free flap reconstruction, selective neck dissection, tracheotomy, and resection of intraoral palate cancer. Left 03/01/2017   [facility]  . LUNG BIOPSY    . PARTIAL NEPHRECTOMY Left 2012  . POLYPECTOMY  10/21/2016   Procedure: POLYPECTOMY;  Surgeon: [name], MD;  Location: [facility];  Service: Endoscopy;;  hepatic flexure x2    There were no vitals filed for this visit.  Subjective Assessment - 10/12/17 1529    Subjective   S: I've been working on my arm with the stick.     Currently in Pain?  No/denies         [facility] OT Assessment - 10/12/17 1530      Assessment   Medical Diagnosis  Left Adhesive Capsulitis and Carpal Tunnel Syndrome      Precautions   Precautions  Other (comment)    Precaution Comments  history of cancer               OT Treatments/Exercises (OP) - 10/12/17 1530  Exercises   Exercises  Shoulder;Hand      Shoulder Exercises: Supine   Protraction  AROM;10 reps    Horizontal ABduction  AROM;10 reps    External Rotation  AROM;10 reps    Internal Rotation  AROM;10 reps    Flexion  AROM;10 reps    ABduction  AROM;10 reps      Shoulder Exercises: Seated   Protraction  AAROM;12 reps    Horizontal ABduction  AAROM;12 reps    External Rotation  AAROM;12 reps    Internal Rotation  AAROM;12 reps    Flexion  AAROM;12 reps    Abduction  AAROM;12 reps      Shoulder Exercises: Standing   Extension  Theraband;10 reps    Theraband Level (Shoulder Extension)  Level 2 (Red)    Row  Theraband;10 reps    Theraband Level (Shoulder Row)  Level 2 (Red)    Retraction  Theraband;10 reps    Theraband Level (Shoulder Retraction)  Level 2 (Red)      Shoulder Exercises: Pulleys   Flexion  1 minute    ABduction  1 minute       Shoulder Exercises: ROM/Strengthening   UBE (Upper Arm Bike)  Level 1 2' forward 2' reverse    Wall Wash  1'    Other ROM/Strengthening Exercises  PVC pipe slide; 10X flexion 10X abduction      Manual Therapy   Manual Therapy  Myofascial release    Manual therapy comments  manual therapy completed seperately from all other interventions this date     Myofascial Release  myofascial release and scar tissue release to left upper arm, scapular, trapezius, and shoulder region to decrease pain and restrictions and improve pain free mobility needed to improve functional use of his left upper extremity.             OT Education - 10/12/17 1719    Education provided  Yes       OT Short Term Goals - 10/03/17 1624      OT SHORT TERM GOAL #1   Title  Patient will be educated on a HEP for improved mobility and strength in his left shoulder and hand in order to reach into overhead cabinets at home.     Time  4    Period  Weeks    Status  On-going      OT SHORT TERM GOAL #2   Title  Patient will improve left shoulder P/ROM to [facility] in order to don and doff clothing with greater independence.     Time  4    Period  Weeks    Status  On-going      OT SHORT TERM GOAL #3   Title  Patient will improve left shoulder strength to 4/5 for greater ability to lift tools when working in his yard.     Time  4    Period  Weeks    Status  On-going      OT SHORT TERM GOAL #4   Title  Patient will imrove left grip strength by 15 pounds and pinch strength by 2 pounds for greater ability to open jars and containers.     Time  4    Period  Weeks    Status  On-going      OT SHORT TERM GOAL #5   Title  Patient will decrease pain in his left shoulder and hand to 4/10 or better when completing daily activities.  Time  4    Period  Weeks    Status  On-going      OT SHORT TERM GOAL #6   Title  Patient will decrease fascial and scar restrictions in left shoulder region from max to mod max for  improved moblity needed to use left arm actively with daily activities.    Time  4    Period  Weeks    Status  On-going        OT Long Term Goals - 10/03/17 1624      OT LONG TERM GOAL #1   Title  Patient will use his left arm actively wtih all daily and leisure activities without hesitation or compensatory movements.     Time  8    Period  Weeks    Status  On-going      OT LONG TERM GOAL #2   Title  Patient will improve left shoulder A/ROM to [facility] in order to reach overhead when putting groceries away and retrieving items from cabinets.     Time  8    Period  Weeks    Status  On-going      OT LONG TERM GOAL #3   Title  Patient will improve left shoulder strength to 4+/5 or better for improved ability to lift gardening tools and bags of groceries.     Time  8    Period  Weeks    Status  On-going      OT LONG TERM GOAL #4   Title  Patient will improve left hand grip strength to 60# or more and pinch strength to 14 pounds or more for improved ability to maintain grip on household objects.     Time  8    Period  Weeks    Status  On-going      OT LONG TERM GOAL #5   Title  Patient will decrease pain in his left hand and shoulder to 2/10 or better when completing functional activities.     Time  8    Period  Weeks    Status  On-going      OT LONG TERM GOAL #6   Title  Patient will decrease scar restrictions and fascial restrictions in his left scapular and forearm regions for improved mobility required to complete ADLs.    Time  8    Period  Weeks    Status  On-going            Plan - 10/12/17 1715    Clinical Impression Statement  A: Pt progressed to scapular theraband using red to increase scapular stability and strength. Patient was given red band for HEP. Omitted passive stretching this session as it typically causes increased pain and patient is able to tolerate more range during A/ROM and AA/ROM. VC form and techniqu.    Plan  P: Continue with A/ROM supine.  Follow up on HEP. Progress to A/ROM seated when range is better. Omit P/ROM next session.     Consulted and Agree with Plan of Care  Patient       Patient will benefit from skilled therapeutic intervention in order to improve the following deficits and impairments:  Pain, Decreased scar mobility, Increased fascial restrictions, Decreased range of motion, Decreased strength, Increased muscle spasms, Impaired UE functional use  Visit Diagnosis: Stiffness of left shoulder, not elsewhere classified  Acute pain of left shoulder  Pain in joint of left hand  Other symptoms and signs involving the musculoskeletal  system    Problem List Patient Active Problem List   Diagnosis Date Noted  . UTI (urinary tract infection) 04/20/2017  . UTI due to Klebsiella species 04/20/2017  . Essential hypertension 04/20/2017  . Chronic pancreatitis ([place]) 04/20/2017  . 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 Malnutrition of moderate degree 04/20/2017  . Complicated UTI (urinary tract infection)   . 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 Rectal bleeding 09/28/2016  . Hyponatremia 04/28/2015  . Abdominal pain 04/27/2015  . Acute pancreatitis 04/27/2015  . Chronic alcoholic pancreatitis ([place]) 04/27/2015  . ETOH abuse 04/27/2015  . DM type 2 (diabetes mellitus, type 2) ([place]) 04/27/2015  . Hypertension, uncontrolled 04/27/2015  . Hyperlipidemia 04/27/2015  . Hypertensive urgency 04/27/2015  . Alcohol abuse    Ailene Ravel, OTR/L,CBIS  [phone]  10/12/2017, 5:44 PM  [place] [address] [place], [place], [address] Phone: [phone]   Fax:  [phone]  Name: Colin Wagner MRN: [id number] Date of Birth: 08-Oct-1950

## 2017-10-14 ENCOUNTER — Encounter: Payer: Self-pay | Admitting: Radiation Oncology

## 2017-10-14 ENCOUNTER — Encounter (HOSPITAL_COMMUNITY): Payer: Self-pay

## 2017-10-14 ENCOUNTER — Ambulatory Visit
Admission: RE | Admit: 2017-10-14 | Discharge: 2017-10-14 | Disposition: A | Discharge status: Home / Self Care | Payer: Medicare Other | Source: Ambulatory Visit | Attending: Radiation Oncology | Admitting: Radiation Oncology

## 2017-10-14 ENCOUNTER — Ambulatory Visit (HOSPITAL_COMMUNITY): Payer: Medicare Other

## 2017-10-14 ENCOUNTER — Other Ambulatory Visit: Payer: Self-pay

## 2017-10-14 VITALS — BP 153/68 | HR 80 | Temp 98.1°F | Ht 71.0 in | Wt 181.4 lb

## 2017-10-14 DIAGNOSIS — R29898 Other symptoms and signs involving the musculoskeletal system: Secondary | ICD-10-CM

## 2017-10-14 DIAGNOSIS — I6521 Occlusion and stenosis of right carotid artery: Secondary | ICD-10-CM | POA: Diagnosis not present

## 2017-10-14 DIAGNOSIS — R682 Dry mouth, unspecified: Secondary | ICD-10-CM | POA: Diagnosis not present

## 2017-10-14 DIAGNOSIS — M25542 Pain in joints of left hand: Secondary | ICD-10-CM

## 2017-10-14 DIAGNOSIS — Z923 Personal history of irradiation: Secondary | ICD-10-CM | POA: Diagnosis not present

## 2017-10-14 DIAGNOSIS — Z79891 Long term (current) use of opiate analgesic: Secondary | ICD-10-CM | POA: Diagnosis not present

## 2017-10-14 DIAGNOSIS — I2510 Atherosclerotic heart disease of native coronary artery without angina pectoris: Secondary | ICD-10-CM | POA: Diagnosis not present

## 2017-10-14 DIAGNOSIS — Z791 Long term (current) use of non-steroidal anti-inflammatories (NSAID): Secondary | ICD-10-CM | POA: Diagnosis not present

## 2017-10-14 DIAGNOSIS — Z931 Gastrostomy status: Secondary | ICD-10-CM | POA: Diagnosis not present

## 2017-10-14 DIAGNOSIS — M25512 Pain in left shoulder: Secondary | ICD-10-CM

## 2017-10-14 DIAGNOSIS — M25612 Stiffness of left shoulder, not elsewhere classified: Secondary | ICD-10-CM | POA: Diagnosis not present

## 2017-10-14 DIAGNOSIS — Z1329 Encounter for screening for other suspected endocrine disorder: Secondary | ICD-10-CM

## 2017-10-14 DIAGNOSIS — Z79899 Other long term (current) drug therapy: Secondary | ICD-10-CM | POA: Diagnosis not present

## 2017-10-14 DIAGNOSIS — I700 Atherosclerosis of aorta: Secondary | ICD-10-CM | POA: Diagnosis not present

## 2017-10-14 DIAGNOSIS — C060 Malignant neoplasm of cheek mucosa: Secondary | ICD-10-CM

## 2017-10-14 DIAGNOSIS — Y842 Radiological procedure and radiotherapy as the cause of abnormal reaction of the patient, or of later complication, without mention of misadventure at the time of the procedure: Secondary | ICD-10-CM | POA: Diagnosis not present

## 2017-10-14 DIAGNOSIS — M19012 Primary osteoarthritis, left shoulder: Secondary | ICD-10-CM | POA: Diagnosis not present

## 2017-10-14 DIAGNOSIS — D1800 Hemangioma unspecified site: Secondary | ICD-10-CM | POA: Diagnosis not present

## 2017-10-14 DIAGNOSIS — Z794 Long term (current) use of insulin: Secondary | ICD-10-CM | POA: Diagnosis not present

## 2017-10-14 MED ORDER — LIDOCAINE VISCOUS 2 % MT SOLN: OROMUCOSAL | 5 refills | Status: DC

## 2017-10-14 NOTE — Progress Notes (Signed)
Radiation Oncology         [phone] ________________________________  Name: Colin Wagner MRN: [id number]  Date: 10/14/2017  DOB: 1951/02/22  Follow-Up Visit Note  CC: [name], MD  [name], MD  Diagnosis and Prior Radiotherapy:       ICD-10-CM   1. 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 I reviewed his recent imaging from 10/06/2017 with tumor board. His CT scan of the chest showed no evidence of metastatic disease in the thorax. There was aortic atherosclerosis in addition to right coronary artery disease. There was also moderate to severe stenosis of the left proximal subclavian artery. His CT scan of the neck showed no evidence of recurrent tumor, cervical lymphadenopathy, or other metastatic disease. There was post-radiation sequelae including hyperenhancement of the left parotid gland, laryngeal soft tissue thickening, and left mastoid effusion.       Pain issues, if any: He denies.  Using a feeding tube?: Yes, he is instilling 4-6 cans of glucerna daily depending on oral intake.   Weight changes, if any:  Wt Readings from Last 3 Encounters:  10/14/17 181 lb 6.4 oz (82.3 kg)  10/05/17 182 lb (82.6 kg)  09/21/17 180 lb (81.6 kg)   Swallowing issues, if any: He denies problems swallowing but has difficulty manipulating food in his mouth. He is eating softer foods such as eggs, oatmeal, and macaroni & cheese. He reports continued dry mouth and mouth sores.    Smoking or chewing tobacco?  No  Using fluoride trays daily? No  Last ENT visit was on: 09/05/2017 with Dr. Vertell Limber in ENT/head and neck surgery at [facility]   ALLERGIES:  has No Known Allergies.  Meds: Current Outpatient Medications  Medication Sig Dispense Refill  . Acetaminophen (TYLENOL 8 HOUR PO) 2 tablets by Feeding Tube route 2 (two) times daily as needed.     Marland Kitchen albuterol (PROVENTIL HFA;VENTOLIN HFA) 108 (90 Base) MCG/ACT inhaler Inhale 1-2 puffs into the lungs every 6 (six) hours as needed for wheezing or shortness of breath. 1 Inhaler 0  . atorvastatin (LIPITOR) 40 MG tablet Take 40 mg by mouth daily at 6 PM.     . B-D UF III MINI PEN NEEDLES 31G X 5 MM MISC   3  . B-D ULTRAFINE III SHORT PEN 31G X 8 MM MISC     . emollient (BIAFINE) cream Apply topically as needed. 454 g 0  . HYDROcodone-acetaminophen (NORCO) 7.5-325 MG tablet Take 1 tablet by mouth every 6 (six) hours as needed (pain). (Patient taking differently: 1 tablet every 6 (six) hours as needed (pain). Per feeding tube) 60 tablet 0  . ibuprofen (ADVIL,MOTRIN) 100 MG/5ML suspension Place 20 mLs (400 mg total) into feeding tube every 6 (six) hours as needed. Take with food. 473 mL 4  . insulin aspart (NOVOLOG) 100 UNIT/ML injection Inject 0-9 Units into the skin every 4 (four) hours. 70-120--No units; 121 to 150--1 unit; 151 to 200--2 units; 201 to 250--3 units; 251 to  300--5 units; 301 to 350--7 units; 351 to 400--9 units; >400--call MD 10 mL 0  . LANTUS 100 UNIT/ML injection Inject 25 Units into the skin every evening.   1  . lidocaine (XYLOCAINE) 2 % solution MIX 1 PART SOLUTION & 1 PART WATER SWISH & SWALLOW 10MLS OF MIXTURE 30 MINS BEFORE MEALS UP TO 4/DAY. OK to spit out if throat isn't sore. 100 mL 5  . lisinopril (PRINIVIL,ZESTRIL) 5 MG tablet Take 5 mg by mouth daily.     Marland Kitchen loratadine (CLARITIN) 10 MG tablet Take 10 mg by mouth daily.    Marland Kitchen LORazepam (ATIVAN) 1 MG tablet Take 1 mg by mouth 4 (four) times daily as needed for anxiety. For anxiety  5  .  pantoprazole (PROTONIX) 20 MG tablet 20 mg daily. Per feeding tube    . senna (SENOKOT) 8.6 MG TABS tablet Take 1 tablet (8.6 mg total) by mouth at bedtime as needed for mild constipation. May be needed as pain medication can constipate. (Patient taking differently: Place 1 tablet into feeding tube at bedtime as needed for mild constipation. May be needed as pain medication can constipate.) 120 each 1  . tamsulosin (FLOMAX) 0.4 MG CAPS capsule TAKE ONE CAPSULE BY MOUTH EVERY DAY FOR PROSTATE  3  . testosterone cypionate (DEPOTESTOSTERONE CYPIONATE) 200 MG/ML injection Inject 100 mg into the muscle every 14 (fourteen) days.     . traZODone (DESYREL) 50 MG tablet Take 50 mg by mouth at bedtime.    . Water For Irrigation, Sterile (FREE WATER) SOLN Place 100 mLs into feeding tube every 8 (eight) hours. 1000 mL 0   No current facility-administered medications for this encounter.     Physical Findings: Wt Readings from Last 3 Encounters:  10/14/17 181 lb 6.4 oz (82.3 kg)  10/05/17 182 lb (82.6 kg)  09/21/17 180 lb (81.6 kg)    height is 5\' 11"  (1.803 m) and weight is 181 lb 6.4 oz (82.3 kg). His temperature is 98.1 F (36.7 C). His blood pressure is 153/68 (abnormal) and his pulse is 80. His oxygen saturation is 96%.    General: Alert and oriented, in no acute distress. 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 No sign of tumor in oropharynx or oral cavity. Mucous membranes are dry. Neck: He has fibrosis concentrated in the left neck consistent with prior surgery and radiation. No palpable adenopathy.   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 EXAM: CT NECK WITH CONTRAST TECHNIQUE: Multidetector CT imaging of the neck was performed using the standard protocol following the bolus administration of intravenous contrast. CONTRAST:  149mL ISOVUE-300 IOPAMIDOL (ISOVUE-300) INJECTION 61% COMPARISON:  CT neck 04/19/2017 FINDINGS: Pharynx and larynx: --Nasopharynx: Fossae of Rosenmuller are clear. Normal adenoid tonsils for age. --Oral cavity and oropharynx: Status post resection of buckle lesion from the left retromolar trigone on with intact fat pad. No discrete mass lesion at this location. There are surgical clips along the course of the left tonsillar pillar. --Hypopharynx: Normal vallecula and pyriform sinuses. --Larynx: There is  thickening of the aryepiglottic and vocal folds. This is worsened compared to 04/19/2017. --Retropharyngeal space: No abscess, effusion or lymphadenopathy. Salivary glands: --Parotid: Hyperenhancement of the left parotid gland is a sequela of radiation treatment. --Submandibular: Normal right submandibular gland. The left is not clearly visualized and probably surgically absent. --Sublingual: Normal. No ranula or other visible lesion of the base of tongue and floor of mouth. Thyroid: Normal. Lymph nodes: Status post left neck dissection.  No lymphadenopathy. Vascular: There is approximately 75% stenosis at the proximal right internal carotid artery, which qualitatively appears unchanged. Limited intracranial: Normal. Visualized orbits: Normal. Mastoids and visualized paranasal sinuses: Complete opacification of the left mastoid, likely a sequela of radiation. Skeleton: No bony spinal canal stenosis. No lytic or blastic lesions. Status post partial left maxillectomy and partial resection of the left mandible with screw and plate reconstruction. Upper chest: Please see dedicated chest CT report. Other: None. IMPRESSION: 1. Status post resection of left buccal  squamous cell carcinoma including left partial maxillectomy, partial resection of the mandible with screw and plate reconstruction and modified left neck dissection with neck flap. No evidence of recurrent tumor. 2. No cervical lymphadenopathy or other evidence of metastatic disease. 3. Post radiation sequelae including hyperenhancement of the left parotid gland, laryngeal soft tissue thickening and left mastoid effusion. 4. Approximately 75% stenosis of the proximal right ICA. Electronically Signed   By: Ulyses Jarred M.D.   On: 10/06/2017 15:13   Ct Chest W Contrast  Result Date: 10/06/2017 CLINICAL DATA:  67 year old male with history of squamous cell carcinoma of the left retromolar trigone diagnosed in April 2018 status post surgery, chemotherapy and radiation therapy. Follow-up evaluation. Additional history of renal cell carcinoma status post partial nephrectomy in 1999. EXAM: CT CHEST WITH CONTRAST TECHNIQUE: Multidetector CT imaging of the chest was performed during intravenous contrast administration. CONTRAST:  142mL ISOVUE-300 IOPAMIDOL (ISOVUE-300) INJECTION 61% COMPARISON:  Chest CT 03/11/2017. FINDINGS: Cardiovascular: Heart size is normal. There is no significant pericardial fluid, thickening or pericardial calcification. There is aortic atherosclerosis, as well as atherosclerosis of the great vessels of the mediastinum and the coronary arteries, including calcified atherosclerotic plaque in the right coronary arteries. Moderate to severe stenosis of the proximal left subclavian artery (axial image 19 of series 2). Mediastinum/Nodes: No pathologically enlarged mediastinal or hilar lymph nodes. Esophagus is unremarkable in appearance. No axillary lymphadenopathy. Lungs/Pleura: Scattered areas of mild linear scarring are noted, most evident throughout the lung bases. No acute consolidative airspace disease. No pleural effusions. 6 x 4 mm subpleural nodule in the anterior aspect of the superior  segment of the left lower lobe (axial image 87 of series 6) stable compared to prior examinations dating back to at least 2017, considered definitively benign requiring no imaging follow-up. No other suspicious appearing pulmonary nodules or masses. Upper Abdomen: Hypervascular lesion in the superior aspect of segment 8 of the liver (axial image 123 of series 2) measuring 11 x 19 mm, the larger than the prior examination of 12/01/2015, but incompletely characterized on today's examination, and previously not hypermetabolic on PET-CT 59/56/3875, favored to represent a benign lesion such as a flash fill cavernous hemangioma. Musculoskeletal: There are no aggressive appearing lytic or blastic lesions noted in the visualized portions of the skeleton. IMPRESSION: 1. No findings to suggest metastatic disease in the thorax. 2. Aortic atherosclerosis, in addition to right coronary artery disease. Please note that although the presence of coronary artery calcium documents the presence of coronary artery disease, the severity of this  disease and any potential stenosis cannot be assessed on this non-gated CT examination. Assessment for potential risk factor modification, dietary therapy or pharmacologic therapy may be warranted, if clinically indicated. 3. 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 4. Small hypervascular lesion in segment 8 of the liver, incompletely characterized but favored to be a benign lesion such as a flash-fill cavernous hemangioma. This could be definitively characterized with MRI of the abdomen with and without IV gadolinium if of clinical concern. Aortic Atherosclerosis (ICD10-I70.0). Electronically Signed   By: Vinnie Langton M.D.   On: 10/06/2017 16:47   Dg Shoulder Left  Result Date: 09/21/2017 Clinical:  Left shoulder pain, no trauma, post treatment for jaw tumor X-rays were done of the left shoulder, two views. There are surgical clips in the axilla area and  posterior scapula area.  Advanced degenerative joint disease is present of the [facility] joint on the left. No fracture is noted.  Bone quality is good. Impression:  DJD of the left AC joint, no acute findings of the left shoulder. Electronically Signed Sanjuana Kava, MD 1/30/20198:49 AM    Impression/Plan:    1) Head and Neck Cancer Status: NED  2) Nutritional Status: Stable weight PEG tube: Using as a supplement  3) Risk Factors: The patient has been educated about risk factors including alcohol and tobacco abuse; they understand that avoidance of alcohol and tobacco is important to prevent recurrences as well as other cancers. The patient is not using tobacco.   4) Swallowing: He is able to eat softer foods but continues to have dry mouth and mouth sores.  I will send in a refill for his Lidocaine to the [facility] in [place].  5) Dental: Encouraged to continue regular followup with dentistry, and dental hygiene including fluoride rinses.    6) Thyroid function:  Lab Results  Component Value Date   TSH 1.414 05/04/2017    7) Other: I recommended referral to vascular surgery for consultation regarding the incidental right internal carotid artery stenosis, but the patient reports he has already seen Dr. Raynelle Jan on 09/30/2017.  I will ask [name], RN, our Head and Neck Oncology Navigator to contact the office of Dr. Karie Kirks regarding the patient's coronary calcifications incidental on chest CT. I also encouraged the patient to discuss this w/ Dr Karie Kirks and to consume a heart-healthy diet.  8) ENT: Continue routine follow-up with Dr. Vertell Limber.  8) Follow-up here in 6 months. The patient was encouraged to call with any issues or questions before then.  I spent 25 minutes face to face with the patient and more than 50% of that time was spent in counseling and/or coordination of care. _____________________________________   [name], MD  This document serves as a record of services  personally performed by [name], MD. It was created on her behalf by Rae Lips, a trained medical scribe. The creation of this record is based on the scribe's personal observations and the provider's statements to them. This document has been checked and approved by the attending provider.

## 2017-10-14 NOTE — Therapy (Signed)
[place], [place], [address] Phone: [phone]   Fax:  [phone]  Occupational Therapy Treatment  Patient Details  Name: Colin Wagner MRN: [id number] Date of Birth: Apr 13, 1951 Referring Provider: Dr. Sanjuana Kava   Encounter Date: 10/14/2017  OT End of Session - 10/14/17 1135    Visit Number  6    Number of Visits  16    Date for OT Re-Evaluation  11/26/17 mini reassess on 10/27/17    Authorization Type  Medicare    OT Start Time  0952    OT Stop Time  1030    OT Time Calculation (min)  38 min    Activity Tolerance  Patient tolerated treatment well    Behavior During Therapy  [facility] for tasks assessed/performed       Past Medical History:  Diagnosis Date  . Alcohol abuse   . Alcoholic pancreatitis 1749   admission  . Chronic back pain   . Chronic pancreatitis ([place])    based on ct findings 2016  . COPD (chronic obstructive pulmonary disease) ([place])   . Diabetes mellitus   . Diverticulosis   . Gastritis   . GERD (gastroesophageal reflux disease)   . Headache   . History of radiation therapy 05/12/17- 06/22/17   Left cheek and bilateral neck/ 60 Gy in 30 fractions to gross disease  . Hyperlipidemia   . Hypertension   . Peptic ulcer disease 1999   Per medical reports, no H pylori  . Renal cancer, left ([place]) 2012   he tells me that he has been released, ?and that he is free of cancer, and never had it to begin with.   . Right shoulder pain   . Testicular hypofunction     Past Surgical History:  Procedure Laterality Date  . COLONOSCOPY  2003   Dr. [name], polyps  . COLONOSCOPY  2005   Dr. [name], multiple diverticula  . COLONOSCOPY  2008   Dr. [name], diverticulosis  . COLONOSCOPY WITH PROPOFOL N/A 10/21/2016   Procedure: COLONOSCOPY WITH PROPOFOL;  Surgeon: [name], MD;  [facility];  Service: Endoscopy;  Laterality: N/A;  8:30 am  . ESOPHAGOGASTRODUODENOSCOPY      Multiple EGDs. 1999 EGD showed gastric ulcers, no H pylori and benign biopsies performed by Dr. [name]. 2001 gastric ulcer healed. Last EGD 2005 had gastritis.  . Left partial mandibulectomy, Scapular free flap reconstruction, selective neck dissection, tracheotomy, and resection of intraoral palate cancer. Left 03/01/2017   [facility]  . LUNG BIOPSY    . PARTIAL NEPHRECTOMY Left 2012  . POLYPECTOMY  10/21/2016   Procedure: POLYPECTOMY;  Surgeon: [name], MD;  [facility];  Service: Endoscopy;;  hepatic flexure x2    There were no vitals filed for this visit.      [facility] OT Assessment - 10/14/17 1012      Assessment   Medical Diagnosis  Left Adhesive Capsulitis and Carpal Tunnel Syndrome      Precautions   Precautions  Other (comment)    Precaution Comments  history of cancer               OT Treatments/Exercises (OP) - 10/14/17 1013      Exercises   Exercises  Shoulder;Hand      Shoulder Exercises: Standing   Protraction  AAROM;12 reps    Horizontal ABduction  AAROM;12 reps    External  Rotation  AAROM;12 reps    Internal Rotation  AAROM;12 reps    Flexion  AAROM;12 reps    ABduction  AAROM;12 reps      Shoulder Exercises: ROM/Strengthening   Wall Wash  1'    Over Head Lace  2'    Other ROM/Strengthening Exercises  PVC pipe slide; 12X flexion 12X abduction      Hand Exercises   Hand Gripper with Medium Beads  all beads with grippper set at 37#    Hand Gripper with Small Beads  3 beads with gripper set at 37#      Manual Therapy   Manual Therapy  Myofascial release    Manual therapy comments  manual therapy completed seperately from all other interventions this date     Myofascial Release  myofascial release and scar tissue release to left upper arm, scapular, trapezius, and shoulder region to decrease pain and restrictions and improve pain free mobility needed to improve functional use of his left upper extremity.     Other Manual Therapy                  OT Short Term Goals - 10/03/17 1624      OT SHORT TERM GOAL #1   Title  Patient will be educated on a HEP for improved mobility and strength in his left shoulder and hand in order to reach into overhead cabinets at home.     Time  4    Period  Weeks    Status  On-going      OT SHORT TERM GOAL #2   Title  Patient will improve left shoulder P/ROM to [facility] in order to don and doff clothing with greater independence.     Time  4    Period  Weeks    Status  On-going      OT SHORT TERM GOAL #3   Title  Patient will improve left shoulder strength to 4/5 for greater ability to lift tools when working in his yard.     Time  4    Period  Weeks    Status  On-going      OT SHORT TERM GOAL #4   Title  Patient will imrove left grip strength by 15 pounds and pinch strength by 2 pounds for greater ability to open jars and containers.     Time  4    Period  Weeks    Status  On-going      OT SHORT TERM GOAL #5   Title  Patient will decrease pain in his left shoulder and hand to 4/10 or better when completing daily activities.     Time  4    Period  Weeks    Status  On-going      OT SHORT TERM GOAL #6   Title  Patient will decrease fascial and scar restrictions in left shoulder region from max to mod max for improved moblity needed to use left arm actively with daily activities.    Time  4    Period  Weeks    Status  On-going        OT Long Term Goals - 10/03/17 1624      OT LONG TERM GOAL #1   Title  Patient will use his left arm actively wtih all daily and leisure activities without hesitation or compensatory movements.     Time  8    Period  Weeks    Status  On-going  OT LONG TERM GOAL #2   Title  Patient will improve left shoulder A/ROM to [facility] in order to reach overhead when putting groceries away and retrieving items from cabinets.     Time  8    Period  Weeks    Status  On-going      OT LONG TERM GOAL #3   Title   Patient will improve left shoulder strength to 4+/5 or better for improved ability to lift gardening tools and bags of groceries.     Time  8    Period  Weeks    Status  On-going      OT LONG TERM GOAL #4   Title  Patient will improve left hand grip strength to 60# or more and pinch strength to 14 pounds or more for improved ability to maintain grip on household objects.     Time  8    Period  Weeks    Status  On-going      OT LONG TERM GOAL #5   Title  Patient will decrease pain in his left hand and shoulder to 2/10 or better when completing functional activities.     Time  8    Period  Weeks    Status  On-going      OT LONG TERM GOAL #6   Title  Patient will decrease scar restrictions and fascial restrictions in his left scapular and forearm regions for improved mobility required to complete ADLs.    Time  8    Period  Weeks    Status  On-going            Plan - 10/14/17 1136    Clinical Impression Statement  A: Completed manual therapy seated while doing scapular mobilzations with patient completing AA/ROM with therapist. Added overhead lacing to increase functional reaching. Pt was able to complete some small beads with gripper set at 37# although was unable to complete all due to muscle fatigue. VC for form and technique during session.    Plan  P: Continue with A/ROM supine. As able progress to A/ROM seated when range is better. Omit P/ROM.    Consulted and Agree with Plan of Care  Patient       Patient will benefit from skilled therapeutic intervention in order to improve the following deficits and impairments:  Pain, Decreased scar mobility, Increased fascial restrictions, Decreased range of motion, Decreased strength, Increased muscle spasms, Impaired UE functional use  Visit Diagnosis: Stiffness of left shoulder, not elsewhere classified  Acute pain of left shoulder  Pain in joint of left hand  Other symptoms and signs involving the musculoskeletal  system    Problem List Patient Active Problem List   Diagnosis Date Noted  . UTI (urinary tract infection) 04/20/2017  . UTI due to Klebsiella species 04/20/2017  . Essential hypertension 04/20/2017  . Chronic pancreatitis ([place]) 04/20/2017  . 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 Malnutrition of moderate degree 04/20/2017  . Complicated UTI (urinary tract infection)   . 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 Rectal bleeding 09/28/2016  . Hyponatremia 04/28/2015  . Abdominal pain 04/27/2015  . Acute pancreatitis 04/27/2015  . Chronic alcoholic pancreatitis ([place]) 04/27/2015  . ETOH abuse 04/27/2015  . DM type 2 (diabetes mellitus, type 2) ([place]) 04/27/2015  . Hypertension, uncontrolled 04/27/2015  . Hyperlipidemia 04/27/2015  . Hypertensive urgency 04/27/2015  . Alcohol abuse    Ailene Ravel, OTR/L,CBIS  [phone]  10/14/2017, 11:49 AM  [place], [place], [address] Phone: [phone]   Fax:  [phone]  Name: CLEMENT DENEAULT MRN: [id number] Date of Birth: 1951-07-18

## 2017-10-17 ENCOUNTER — Telehealth: Payer: Self-pay | Admitting: *Deleted

## 2017-10-17 NOTE — Telephone Encounter (Signed)
Oncology Nurse Navigator Documentation  Per Dr. Isidore Moos and in follow-up to patient's 10/14/17 appt, contacted office of PCP Dr. Karie Kirks, spoke with Olivia Mackie.  Informed re 2/14 CT Chest and Neck findings, faxed Dr. Pearlie Oyster Progress Note and reports for scans.  Notification of successful fax transmission received.  [name], RN, BSN Head & Neck Oncology Nurse [place] [facility] [place] [phone]

## 2017-10-17 NOTE — Telephone Encounter (Signed)
CALLED PATIENT TO INFORM OF LAB @ 1:30 PM ON 04-14-18, PATIENT AGREED TO THIS LAB

## 2017-10-18 ENCOUNTER — Ambulatory Visit (HOSPITAL_COMMUNITY): Payer: Medicare Other

## 2017-10-18 DIAGNOSIS — M25512 Pain in left shoulder: Secondary | ICD-10-CM

## 2017-10-18 DIAGNOSIS — R29898 Other symptoms and signs involving the musculoskeletal system: Secondary | ICD-10-CM

## 2017-10-18 DIAGNOSIS — M25612 Stiffness of left shoulder, not elsewhere classified: Secondary | ICD-10-CM

## 2017-10-18 DIAGNOSIS — M25542 Pain in joints of left hand: Secondary | ICD-10-CM

## 2017-10-18 NOTE — Patient Instructions (Signed)
Repeat all exercises 10-15 times, 1-2 times per day.  1) Shoulder Protraction    Begin with elbows by your side, slowly "punch" straight out in front of you.      2) Shoulder Flexion  Supine:              Begin with arms at your side with thumbs pointed up, slowly raise both arms up and forward towards overhead.     3) Horizontal abduction/adduction  Supine:              Begin with arms straight out in front of you, bring out to the side in at "T" shape. Keep arms straight entire time.       4) Internal & External Rotation    *No band* -Stand with elbows at the side and elbows bent 90 degrees. Move your forearms away from your body, then bring back inward toward the body.     5) Shoulder Abduction  Supine:           Lying on your back begin with your arms flat on the table next to your side. Slowly move your arms out to the side so that they go overhead, in a jumping jack or snow angel movement.     (Home) Extension: Isometric / Bilateral Arm Retraction - Sitting   Facing anchor, hold hands and elbow at shoulder height, with elbow bent.  Pull arms back to squeeze shoulder blades together. Repeat 10-15 times. 1-3 times/day.   (Clinic) Extension / Flexion (Assist)   Face anchor, pull arms back, keeping elbow straight, and squeze shoulder blades together. Repeat 10-15 times. 1-3 times/day.   Copyright  VHI. All rights reserved.   (Home) Retraction: Row - Bilateral (Anchor)   Facing anchor, arms reaching forward, pull hands toward stomach, keeping elbows bent and at your sides and pinching shoulder blades together. Repeat 10-15 times. 1-3 times/day.   Copyright  VHI. All rights reserved.

## 2017-10-18 NOTE — Therapy (Signed)
[place], [place], [address] Phone: [phone]   Fax:  [phone]  Occupational Therapy Treatment  Patient Details  Name: Colin Wagner MRN: [id number] Date of Birth: March 27, 1951 Referring Provider: Dr. Sanjuana Kava   Encounter Date: 10/18/2017  OT End of Session - 10/18/17 1723    Visit Number  7    Number of Visits  16    Date for OT Re-Evaluation  11/26/17 mini reassess on 10/27/17    Authorization Type  Medicare    OT Start Time  1607    OT Stop Time  1645    OT Time Calculation (min)  38 min    Activity Tolerance  Patient tolerated treatment well    Behavior During Therapy  [facility] for tasks assessed/performed       Past Medical History:  Diagnosis Date  . Alcohol abuse   . Alcoholic pancreatitis 6811   admission  . Chronic back pain   . Chronic pancreatitis ([place])    based on ct findings 2016  . COPD (chronic obstructive pulmonary disease) ([place])   . Diabetes mellitus   . Diverticulosis   . Gastritis   . GERD (gastroesophageal reflux disease)   . Headache   . History of radiation therapy 05/12/17- 06/22/17   Left cheek and bilateral neck/ 60 Gy in 30 fractions to gross disease  . Hyperlipidemia   . Hypertension   . Peptic ulcer disease 1999   Per medical reports, no H pylori  . Renal cancer, left ([place]) 2012   he tells me that he has been released, ?and that he is free of cancer, and never had it to begin with.   . Right shoulder pain   . Testicular hypofunction     Past Surgical History:  Procedure Laterality Date  . COLONOSCOPY  2003   Dr. [name], polyps  . COLONOSCOPY  2005   Dr. [name], multiple diverticula  . COLONOSCOPY  2008   Dr. [name], diverticulosis  . COLONOSCOPY WITH PROPOFOL N/A 10/21/2016   Procedure: COLONOSCOPY WITH PROPOFOL;  Surgeon: [name], MD;  [facility];  Service: Endoscopy;  Laterality: N/A;  8:30 am  . ESOPHAGOGASTRODUODENOSCOPY     Multiple EGDs. 1999 EGD showed gastric ulcers, no H pylori and benign biopsies performed by Dr. [name]. 2001 gastric ulcer healed. Last EGD 2005 had gastritis.  . Left partial mandibulectomy, Scapular free flap reconstruction, selective neck dissection, tracheotomy, and resection of intraoral palate cancer. Left 03/01/2017   [facility]  . LUNG BIOPSY    . PARTIAL NEPHRECTOMY Left 2012  . POLYPECTOMY  10/21/2016   Procedure: POLYPECTOMY;  Surgeon: [name], MD;  [facility];  Service: Endoscopy;;  hepatic flexure x2    There were no vitals filed for this visit.  Subjective Assessment - 10/18/17 1621    Subjective   S: My hand is sore. I got it stuck in a door.     Currently in Pain?  No/denies                   OT Treatments/Exercises (OP) - 10/18/17 0001      Exercises   Exercises  Shoulder;Hand      Shoulder Exercises: Supine   Protraction  AROM;12 reps    Horizontal ABduction  AROM;12 reps    External Rotation  AROM;12 reps    Internal Rotation  AROM;12 reps  Flexion  AROM;12 reps    ABduction  AROM;12 reps      Shoulder Exercises: Standing   Extension  Theraband;12 reps    Theraband Level (Shoulder Extension)  Level 2 (Red)    Row  Theraband;12 reps    Theraband Level (Shoulder Row)  Level 2 (Red)    Retraction  Theraband;12 reps    Theraband Level (Shoulder Retraction)  Level 2 (Red)      Shoulder Exercises: ROM/Strengthening   UBE (Upper Arm Bike)  Level 2 2' forward 2' reverse    Wall Wash  1'    Over Head Lace  2'    Proximal Shoulder Strengthening, Supine  10 X with no rest breaks    Other ROM/Strengthening Exercises  PVC pipe slide; 12X flexion 12X abduction      Manual Therapy   Manual Therapy  Myofascial release    Manual therapy comments  manual therapy completed seperately from all other interventions this date     Myofascial Release  myofascial release and scar tissue release to left upper arm,  scapular, trapezius, and shoulder region to decrease pain and restrictions and improve pain free mobility needed to improve functional use of his left upper extremity.             OT Education - 10/18/17 1723    Education provided  Yes    Education Details  A/ROM and scapular strengthening with red band.     Person(s) Educated  Patient    Methods  Explanation;Demonstration;Handout;Verbal cues    Comprehension  Returned demonstration;Verbalized understanding       OT Short Term Goals - 10/03/17 1624      OT SHORT TERM GOAL #1   Title  Patient will be educated on a HEP for improved mobility and strength in his left shoulder and hand in order to reach into overhead cabinets at home.     Time  4    Period  Weeks    Status  On-going      OT SHORT TERM GOAL #2   Title  Patient will improve left shoulder P/ROM to [facility] in order to don and doff clothing with greater independence.     Time  4    Period  Weeks    Status  On-going      OT SHORT TERM GOAL #3   Title  Patient will improve left shoulder strength to 4/5 for greater ability to lift tools when working in his yard.     Time  4    Period  Weeks    Status  On-going      OT SHORT TERM GOAL #4   Title  Patient will imrove left grip strength by 15 pounds and pinch strength by 2 pounds for greater ability to open jars and containers.     Time  4    Period  Weeks    Status  On-going      OT SHORT TERM GOAL #5   Title  Patient will decrease pain in his left shoulder and hand to 4/10 or better when completing daily activities.     Time  4    Period  Weeks    Status  On-going      OT SHORT TERM GOAL #6   Title  Patient will decrease fascial and scar restrictions in left shoulder region from max to mod max for improved moblity needed to use left arm actively with daily activities.    Time  4    Period  Weeks    Status  On-going        OT Long Term Goals - 10/03/17 1624      OT LONG TERM GOAL #1   Title  Patient will  use his left arm actively wtih all daily and leisure activities without hesitation or compensatory movements.     Time  8    Period  Weeks    Status  On-going      OT LONG TERM GOAL #2   Title  Patient will improve left shoulder A/ROM to [facility] in order to reach overhead when putting groceries away and retrieving items from cabinets.     Time  8    Period  Weeks    Status  On-going      OT LONG TERM GOAL #3   Title  Patient will improve left shoulder strength to 4+/5 or better for improved ability to lift gardening tools and bags of groceries.     Time  8    Period  Weeks    Status  On-going      OT LONG TERM GOAL #4   Title  Patient will improve left hand grip strength to 60# or more and pinch strength to 14 pounds or more for improved ability to maintain grip on household objects.     Time  8    Period  Weeks    Status  On-going      OT LONG TERM GOAL #5   Title  Patient will decrease pain in his left hand and shoulder to 2/10 or better when completing functional activities.     Time  8    Period  Weeks    Status  On-going      OT LONG TERM GOAL #6   Title  Patient will decrease scar restrictions and fascial restrictions in his left scapular and forearm regions for improved mobility required to complete ADLs.    Time  8    Period  Weeks    Status  On-going            Plan - 10/18/17 1750    Clinical Impression Statement  A: Updated HEP to include red band exercises for scapular strengthening and A/ROM. Pt with decreased fascial restrictions this date. Continues to have limitations with ROM. VC for form and technique.    Plan  P: Add functional reach task in cabinet. Omit P/ROM. Myofascial release PRN. Reassessment    Consulted and Agree with Plan of Care  Patient       Patient will benefit from skilled therapeutic intervention in order to improve the following deficits and impairments:  Pain, Decreased scar mobility, Increased fascial restrictions, Decreased range of  motion, Decreased strength, Increased muscle spasms, Impaired UE functional use  Visit Diagnosis: Stiffness of left shoulder, not elsewhere classified  Acute pain of left shoulder  Pain in joint of left hand  Other symptoms and signs involving the musculoskeletal system    Problem List Patient Active Problem List   Diagnosis Date Noted  . UTI (urinary tract infection) 04/20/2017  . UTI due to Klebsiella species 04/20/2017  . Essential hypertension 04/20/2017  . Chronic pancreatitis ([place]) 04/20/2017  . 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 Malnutrition of moderate degree 04/20/2017  . Complicated UTI (urinary tract infection)   . 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 Rectal bleeding 09/28/2016  . Hyponatremia 04/28/2015  . Abdominal pain 04/27/2015  .  Acute pancreatitis 04/27/2015  . Chronic alcoholic pancreatitis ([place]) 04/27/2015  . ETOH abuse 04/27/2015  . DM type 2 (diabetes mellitus, type 2) ([place]) 04/27/2015  . Hypertension, uncontrolled 04/27/2015  . Hyperlipidemia 04/27/2015  . Hypertensive urgency 04/27/2015  . Alcohol abuse    Ailene Ravel, OTR/L,CBIS  [phone]  10/18/2017, 5:52 PM  [place] [address] [place], [place], [address] Phone: [phone]   Fax:  [phone]  Name: TUCKER MINTER MRN: [id number] Date of Birth: Jun 09, 1951

## 2017-10-20 ENCOUNTER — Ambulatory Visit (HOSPITAL_COMMUNITY): Payer: Medicare Other

## 2017-10-20 ENCOUNTER — Other Ambulatory Visit: Payer: Self-pay

## 2017-10-20 ENCOUNTER — Encounter (HOSPITAL_COMMUNITY): Payer: Self-pay

## 2017-10-20 DIAGNOSIS — M25612 Stiffness of left shoulder, not elsewhere classified: Secondary | ICD-10-CM

## 2017-10-20 DIAGNOSIS — M25542 Pain in joints of left hand: Secondary | ICD-10-CM

## 2017-10-20 DIAGNOSIS — R29898 Other symptoms and signs involving the musculoskeletal system: Secondary | ICD-10-CM

## 2017-10-20 DIAGNOSIS — M25512 Pain in left shoulder: Secondary | ICD-10-CM

## 2017-10-20 NOTE — Therapy (Signed)
[place], [place], [address] Phone: [phone]   Fax:  [phone]  Occupational Therapy Treatment  Patient Details  Name: Colin Wagner MRN: [id number] Date of Birth: 1950-09-19 Referring Provider: Dr. Sanjuana Kava   Encounter Date: 10/20/2017  OT End of Session - 10/20/17 1215    Visit Number  8    Number of Visits  16    Date for OT Re-Evaluation  11/26/17 mini reassess on 10/27/17    Authorization Type  Medicare    OT Start Time  1036    OT Stop Time  1115    OT Time Calculation (min)  39 min    Activity Tolerance  Patient tolerated treatment well    Behavior During Therapy  [facility] for tasks assessed/performed       Past Medical History:  Diagnosis Date  . Alcohol abuse   . Alcoholic pancreatitis 7001   admission  . Chronic back pain   . Chronic pancreatitis ([place])    based on ct findings 2016  . COPD (chronic obstructive pulmonary disease) ()   . Diabetes mellitus   . Diverticulosis   . Gastritis   . GERD (gastroesophageal reflux disease)   . Headache   . History of radiation therapy 05/12/17- 06/22/17   Left cheek and bilateral neck/ 60 Gy in 30 fractions to gross disease  . Hyperlipidemia   . Hypertension   . Peptic ulcer disease 1999   Per medical reports, no H pylori  . Renal cancer, left ([place]) 2012   he tells me that he has been released, ?and that he is free of cancer, and never had it to begin with.   . Right shoulder pain   . Testicular hypofunction     Past Surgical History:  Procedure Laterality Date  . COLONOSCOPY  2003   Dr. [name], polyps  . COLONOSCOPY  2005   Dr. [name], multiple diverticula  . COLONOSCOPY  2008   Dr. [name], diverticulosis  . COLONOSCOPY WITH PROPOFOL N/A 10/21/2016   Procedure: COLONOSCOPY WITH PROPOFOL;  Surgeon: [name], MD;  [facility];  Service: Endoscopy;  Laterality: N/A;  8:30 am  . ESOPHAGOGASTRODUODENOSCOPY     Multiple EGDs. 1999 EGD showed gastric ulcers, no H pylori and benign biopsies performed by Dr. [name]. 2001 gastric ulcer healed. Last EGD 2005 had gastritis.  . Left partial mandibulectomy, Scapular free flap reconstruction, selective neck dissection, tracheotomy, and resection of intraoral palate cancer. Left 03/01/2017   [facility]  . LUNG BIOPSY    . PARTIAL NEPHRECTOMY Left 2012  . POLYPECTOMY  10/21/2016   Procedure: POLYPECTOMY;  Surgeon: [name], MD;  Location: [facility];  Service: Endoscopy;;  hepatic flexure x2    There were no vitals filed for this visit.  Subjective Assessment - 10/20/17 1214    Subjective   S: It's a little sore today.    Currently in Pain?  Yes    Pain Score  4     Pain Location  Shoulder    Pain Orientation  Left    Pain Descriptors / Indicators  Sore    Pain Type  Chronic pain    Pain Radiating Towards  N/A    Pain Onset  In the past 7 days    Pain Frequency  Intermittent    Aggravating Factors   reaching above shoulder level    Pain Relieving Factors  heat    Effect of Pain on Daily Activities  Min effect    Multiple Pain Sites  No                   OT Treatments/Exercises (OP) - 10/20/17 1043      Exercises   Exercises  Shoulder;Hand      Shoulder Exercises: Standing   Protraction  AROM;10 reps    Horizontal ABduction  AROM;10 reps    External Rotation  AROM;10 reps    Internal Rotation  AROM;10 reps    Flexion  AROM;10 reps    ABduction  AROM;10 reps    Extension  Theraband;12 reps    Theraband Level (Shoulder Extension)  Level 2 (Red)    Row  Theraband;12 reps    Theraband Level (Shoulder Row)  Level 2 (Red)    Retraction  Theraband;12 reps    Theraband Level (Shoulder Retraction)  Level 2 (Red)      Shoulder Exercises: ROM/Strengthening   UBE (Upper Arm Bike)  Level 2 2' forward 2' reverse    Over Head Lace  2'    Proximal Shoulder Strengthening, Seated  10X 1 rest break       Shoulder Exercises: Stretch   External Rotation Stretch  2 reps;30 seconds    Wall Stretch - Flexion  2 reps;30 seconds    Wall Stretch - ABduction  2 reps;30 seconds      Hand Exercises   Hand Gripper with Small Beads  all beads with gripper set at 37#      Functional Reaching Activities   High Level  Functional reaching task completed with placing cones on 2nd shelf of cabinet using LUE. 5 placed on 2nd shelf and 5 placed on top shelf. Cones were then removed with LUE.                OT Short Term Goals - 10/03/17 1624      OT SHORT TERM GOAL #1   Title  Patient will be educated on a HEP for improved mobility and strength in his left shoulder and hand in order to reach into overhead cabinets at home.     Time  4    Period  Weeks    Status  On-going      OT SHORT TERM GOAL #2   Title  Patient will improve left shoulder P/ROM to [facility] in order to don and doff clothing with greater independence.     Time  4    Period  Weeks    Status  On-going      OT SHORT TERM GOAL #3   Title  Patient will improve left shoulder strength to 4/5 for greater ability to lift tools when working in his yard.     Time  4    Period  Weeks    Status  On-going      OT SHORT TERM GOAL #4   Title  Patient will imrove left grip strength by 15 pounds and pinch strength by 2 pounds for greater ability to open jars and containers.     Time  4    Period  Weeks    Status  On-going      OT SHORT TERM GOAL #5   Title  Patient will decrease pain in his left shoulder and hand to 4/10 or better when completing daily activities.     Time  4    Period  Weeks    Status  On-going      OT SHORT TERM GOAL #6   Title  Patient will decrease fascial and scar restrictions in left shoulder region from max to mod max for improved moblity needed to use left arm actively with daily activities.    Time  4    Period  Weeks    Status  On-going        OT Long Term Goals - 10/03/17 1624      OT LONG TERM GOAL #1    Title  Patient will use his left arm actively wtih all daily and leisure activities without hesitation or compensatory movements.     Time  8    Period  Weeks    Status  On-going      OT LONG TERM GOAL #2   Title  Patient will improve left shoulder A/ROM to [facility] in order to reach overhead when putting groceries away and retrieving items from cabinets.     Time  8    Period  Weeks    Status  On-going      OT LONG TERM GOAL #3   Title  Patient will improve left shoulder strength to 4+/5 or better for improved ability to lift gardening tools and bags of groceries.     Time  8    Period  Weeks    Status  On-going      OT LONG TERM GOAL #4   Title  Patient will improve left hand grip strength to 60# or more and pinch strength to 14 pounds or more for improved ability to maintain grip on household objects.     Time  8    Period  Weeks    Status  On-going      OT LONG TERM GOAL #5   Title  Patient will decrease pain in his left hand and shoulder to 2/10 or better when completing functional activities.     Time  8    Period  Weeks    Status  On-going      OT LONG TERM GOAL #6   Title  Patient will decrease scar restrictions and fascial restrictions in his left scapular and forearm regions for improved mobility required to complete ADLs.    Time  8    Period  Weeks    Status  On-going            Plan - 10/20/17 1215    Clinical Impression Statement  A; Session focused on functional reaching task and increasing ROM with shoulder stretches. patient was able to complete handgripper task with small beads while grabbing all beads at set resistance of 37#. VC for form and technique.     Plan  P: Myofascial release PRN. Omit P/ROM. Continue with functional use of LUE during tasks. Large therapy ball circles.    Consulted and Agree with Plan of Care  Patient       Patient will benefit from skilled therapeutic intervention in order to improve the following deficits and impairments:   Pain, Decreased scar mobility, Increased fascial restrictions, Decreased range of motion, Decreased strength, Increased muscle spasms, Impaired UE functional use  Visit Diagnosis: Stiffness of left shoulder, not elsewhere classified  Acute pain of left shoulder  Other symptoms and signs involving the musculoskeletal system  Pain in joint of left hand    Problem List Patient Active Problem List   Diagnosis Date Noted  . UTI (urinary tract infection) 04/20/2017  . UTI due to Klebsiella  species 04/20/2017  . Essential hypertension 04/20/2017  . Chronic pancreatitis ([place]) 04/20/2017  . 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 Malnutrition of moderate degree 04/20/2017  . Complicated UTI (urinary tract infection)   . 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 Rectal bleeding 09/28/2016  . Hyponatremia 04/28/2015  . Abdominal pain 04/27/2015  . Acute pancreatitis 04/27/2015  . Chronic alcoholic pancreatitis ([place]) 04/27/2015  . ETOH abuse 04/27/2015  . DM type 2 (diabetes mellitus, type 2) ([place]) 04/27/2015  . Hypertension, uncontrolled 04/27/2015  . Hyperlipidemia 04/27/2015  . Hypertensive urgency 04/27/2015  . Alcohol abuse    Ailene Ravel, OTR/L,CBIS  [phone]  10/20/2017, 12:19 PM  [place] [address] [place], [place], [address] Phone: [phone]   Fax:  [phone]  Name: Colin Wagner MRN: [id number] Date of Birth: August 17, 1951

## 2017-10-24 IMAGING — CT CT ABD-PELV W/ CM
2 of 4 series · 16 of 46 positions shown, 18 images · IV contrast (Isovue)
Comparison: 01/12/2017, 05/28/2015.

CLINICAL DATA: Abdominal pain, onset today.  Fevers.

EXAM:
CT ABDOMEN AND PELVIS WITH CONTRAST
TECHNIQUE: Multidetector CT imaging of the abdomen and pelvis was performed
using the standard protocol following bolus administration of
intravenous contrast.
CONTRAST:  150mL 42LJY8-QTT IOPAMIDOL (42LJY8-QTT) INJECTION 61%

[Series 2: axial st · axial · 0.69mm/px · z∈[-795,-365]mm · 13 of 96 slices shown, 15 images]
[im 5/96  soft-tissue]
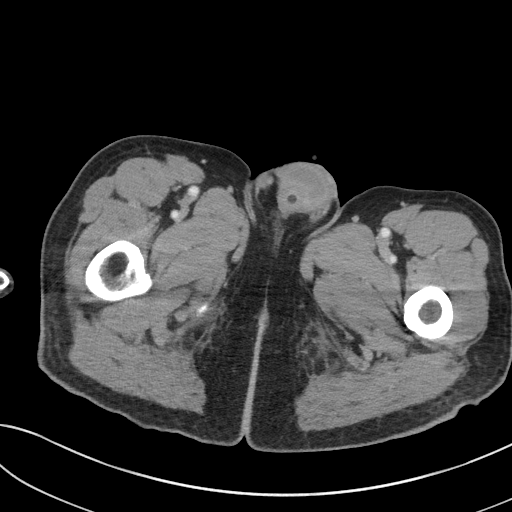
[im 5/96  bone]
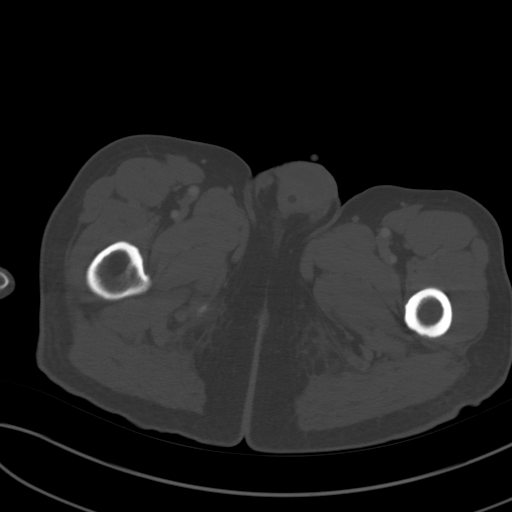
[im 13/96  soft-tissue]
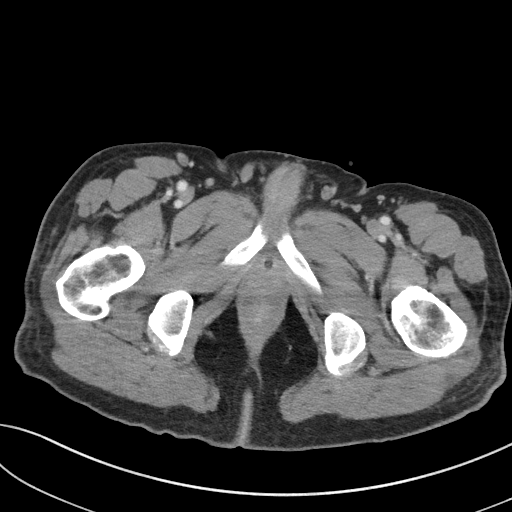
[im 22/96  soft-tissue]
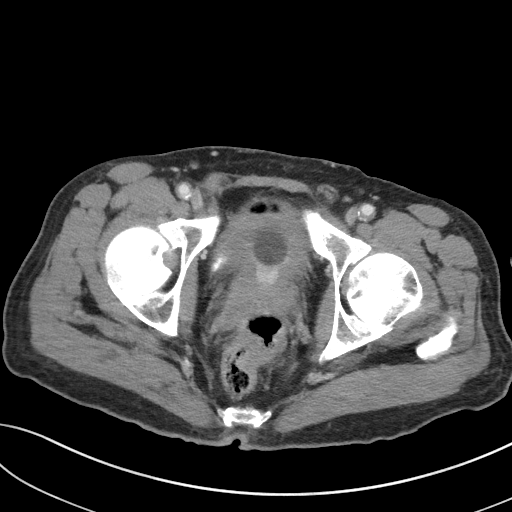
[im 26/96  soft-tissue]
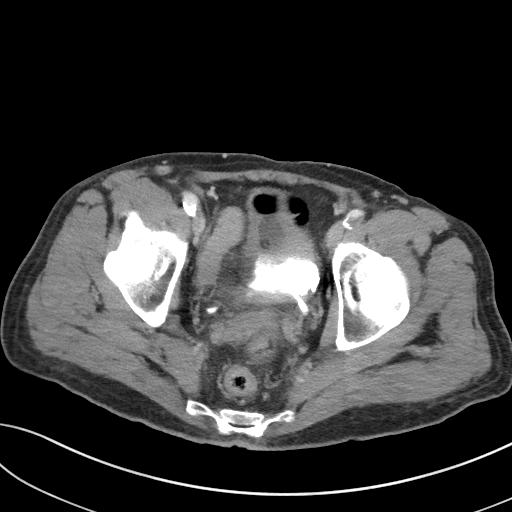
[im 35/96  soft-tissue]
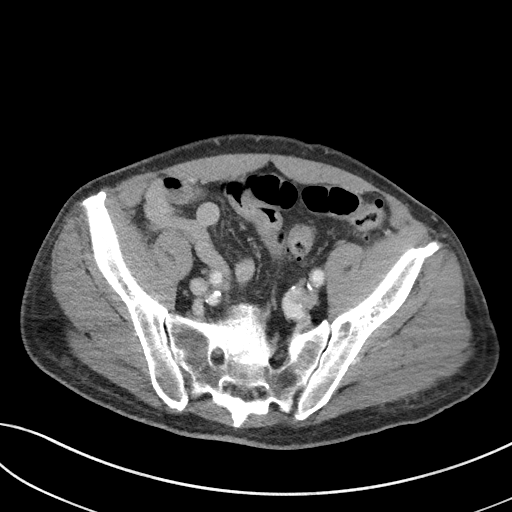
[im 39/96  soft-tissue]
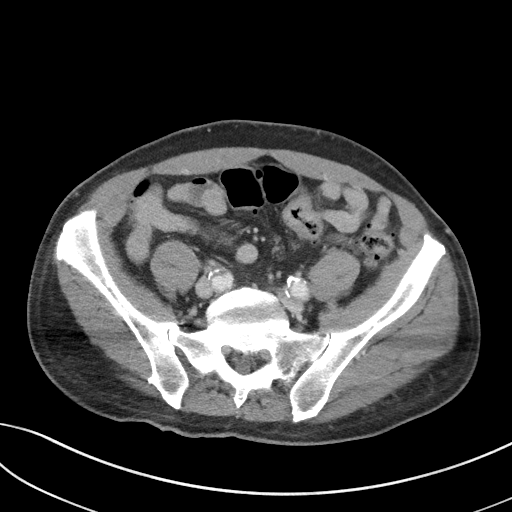
[im 48/96  soft-tissue]
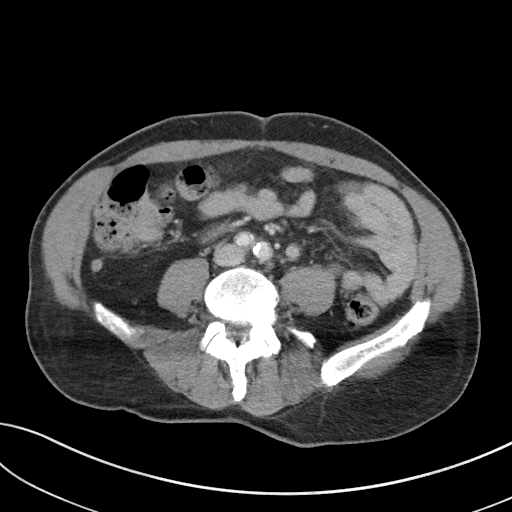
[im 57/96  soft-tissue]
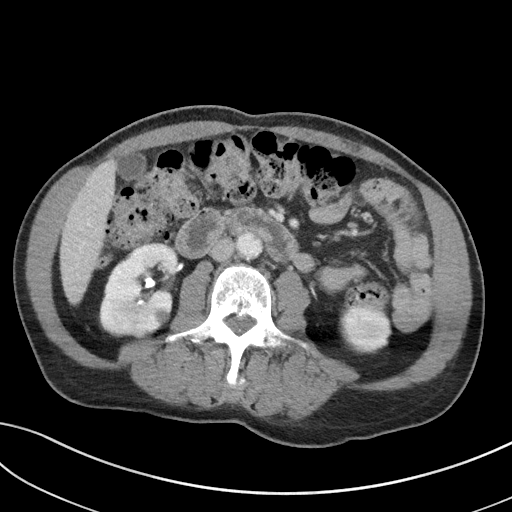
[im 61/96  soft-tissue]
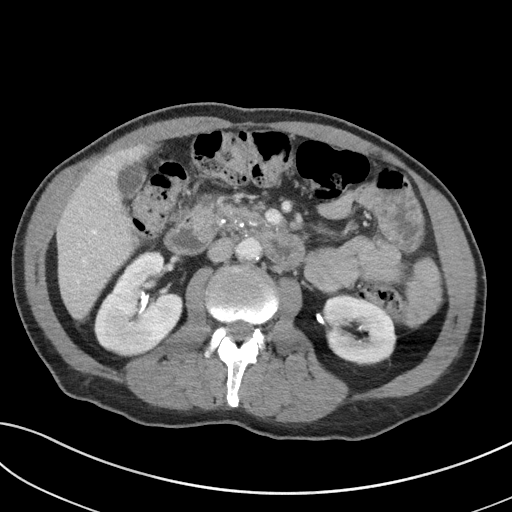
[im 61/96  bone]
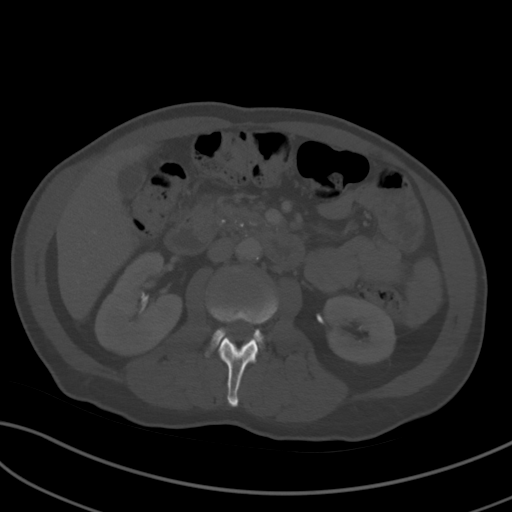
[im 70/96  soft-tissue]
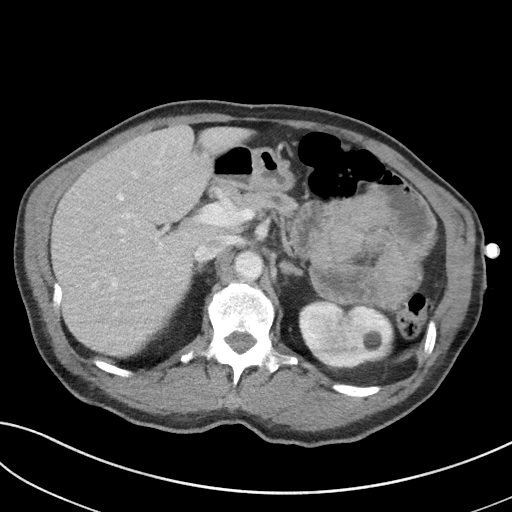
[im 74/96  soft-tissue]
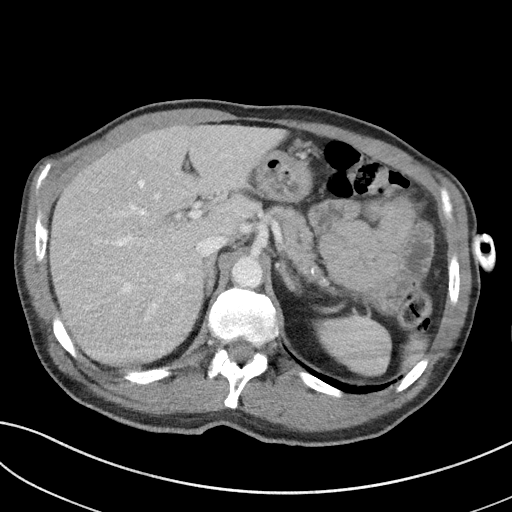
[im 83/96  soft-tissue]
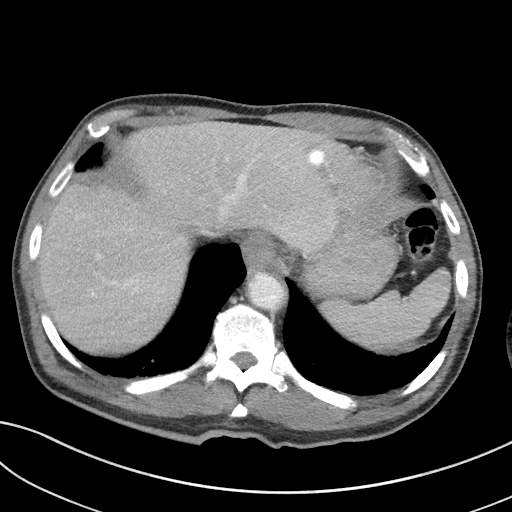
[im 91/96  soft-tissue]
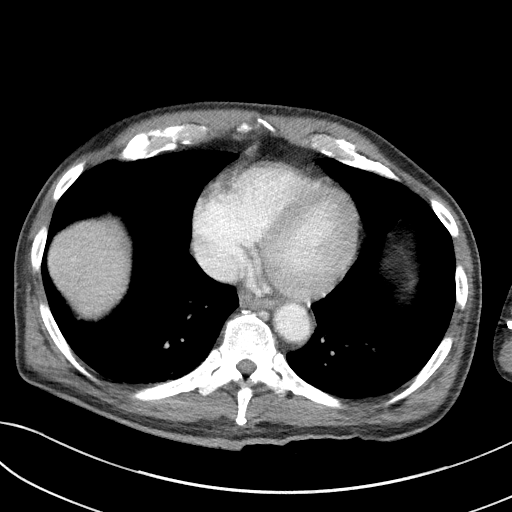

[Series 5: coronal st · coronal · 0.83mm/px · 3 of 77 slices shown]
[im 26/77  soft-tissue]
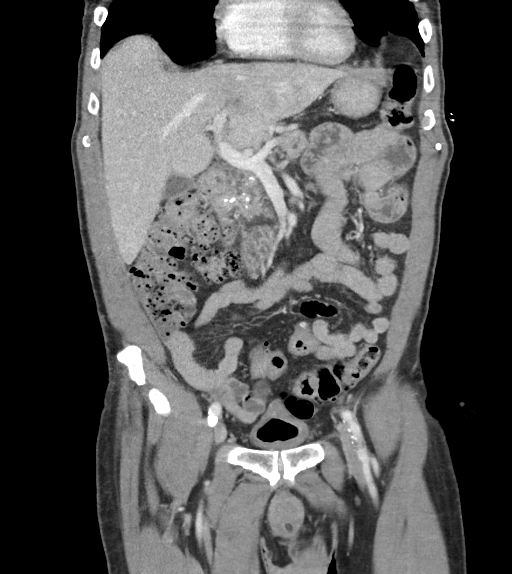
[im 34/77  soft-tissue]
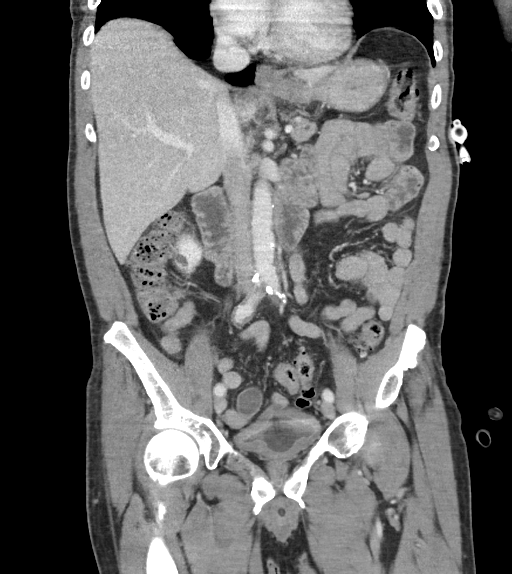
[im 43/77  soft-tissue]
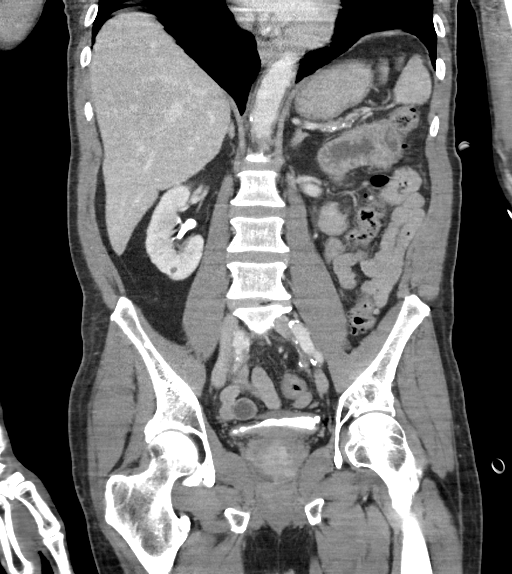

[16 of 46 positions shown; findings below may reference images not displayed]

FINDINGS: Lower chest: No acute abnormality.

Hepatobiliary: No focal liver abnormality is seen. No gallstones,
gallbladder wall thickening, or biliary dilatation.

Pancreas: Pancreatic calcifications and mild atrophy consistent with
chronic pancreatitis. No evidence of acute pancreatitis. No
pancreatic parenchymal mass or cyst.

Spleen: Normal in size without focal abnormality.

Adrenals/Urinary Tract: Adrenal glands are unremarkable. Kidneys are
normal, without renal calculi, significant focal lesion, or
hydronephrosis. Bladder is remarkable only for Foley catheter.

Stomach/Bowel: Percutaneous gastrostomy appears satisfactorily
positioned. Stomach and small bowel are otherwise unremarkable.
Normal appendix. Colonic diverticulosis without evidence of acute
diverticulitis.

Vascular/Lymphatic: The abdominal aorta is normal in caliber with
moderate atherosclerotic calcification. No adenopathy is evident in
the abdomen or pelvis.

Reproductive: Unremarkable

Other: No focal inflammation.  No ascites.

Musculoskeletal: No significant skeletal lesion.
IMPRESSION: 1. No acute findings are evident in the abdomen or pelvis.
2. Chronic pancreatitis.
3. Colonic diverticulosis.

## 2017-10-24 IMAGING — CT CT NECK W/ CM
3 series · 14 of 33 positions shown, 17 images · IV contrast (iopamidol)
Comparison: CT neck March 19, 2017

CLINICAL DATA: Fever. Jaw swelling. History of head and neck
cancer.

EXAM:
CT NECK WITH CONTRAST
TECHNIQUE: Multidetector CT imaging of the neck was performed using the
standard protocol following the bolus administration of intravenous
contrast.
CONTRAST:  150mL [id number] IOPAMIDOL ([id number]) INJECTION 61%

[Series 2: axial neck · axial · 0.53mm/px · z∈[-196,-2]mm · 6 of 127 slices shown, 8 images]
[im 20/127  soft-tissue]
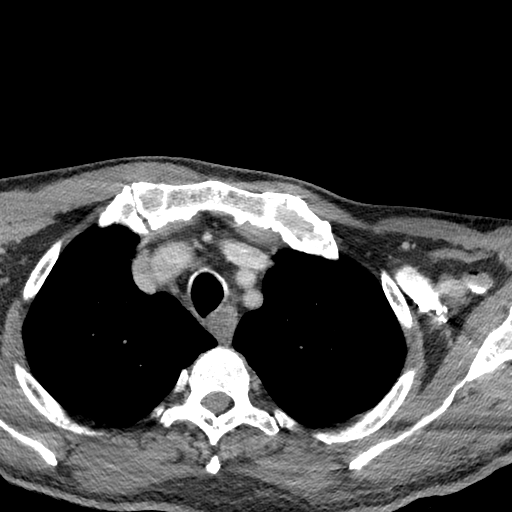
[im 20/127  bone]
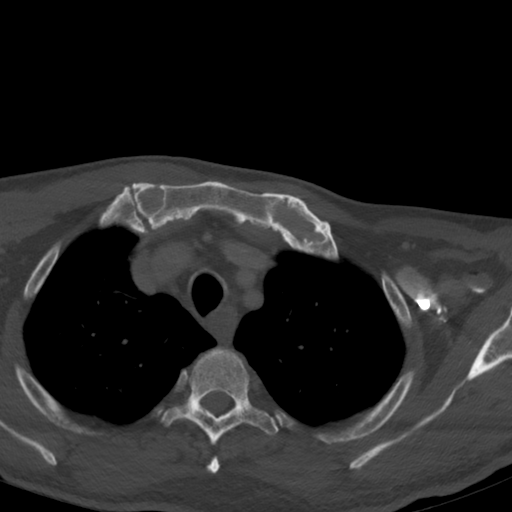
[im 39/127  bone]
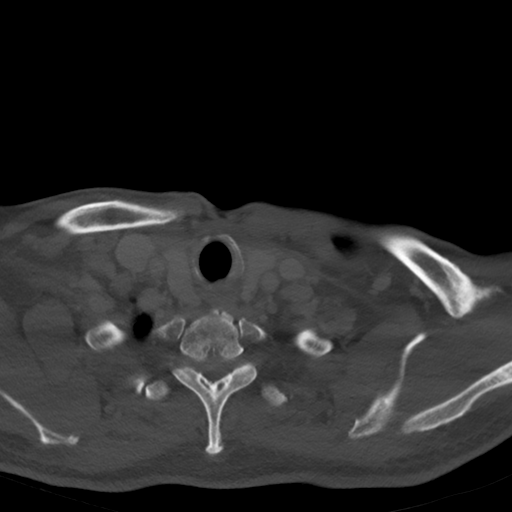
[im 59/127  bone]
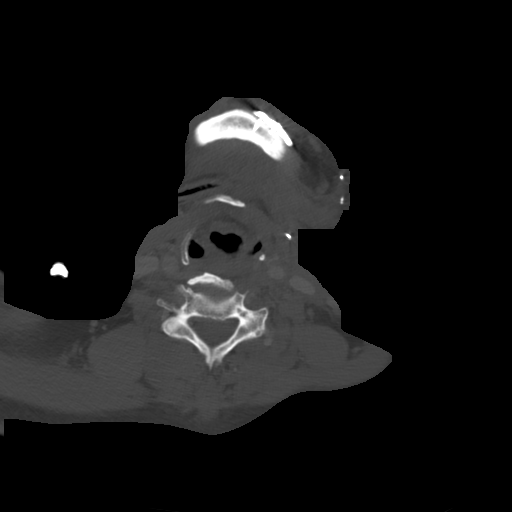
[im 78/127  bone]
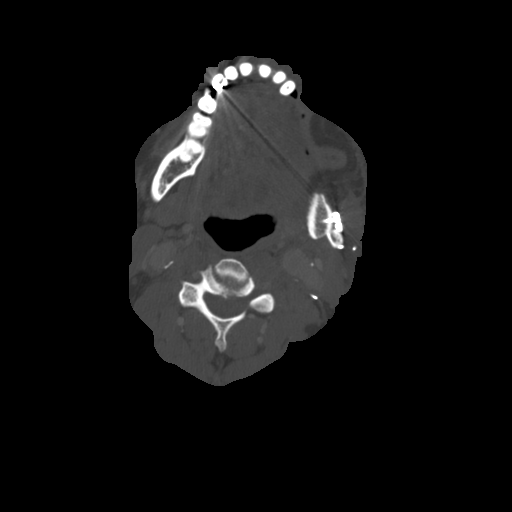
[im 97/127  soft-tissue]
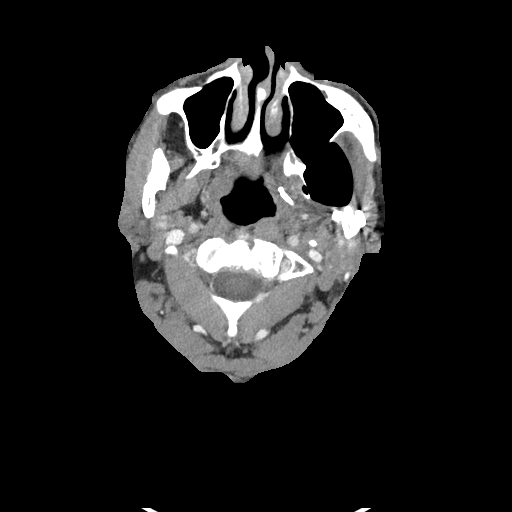
[im 97/127  bone]
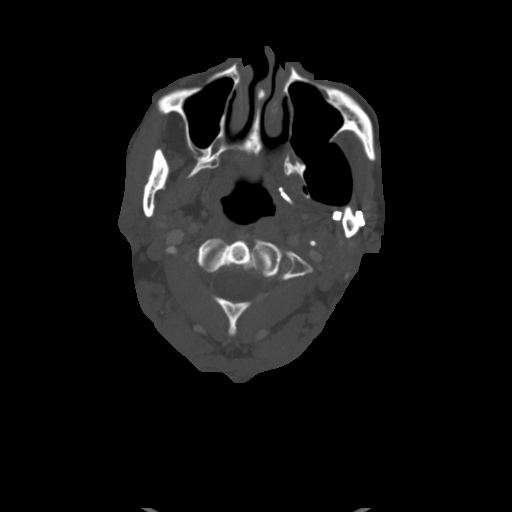
[im 117/127  bone]
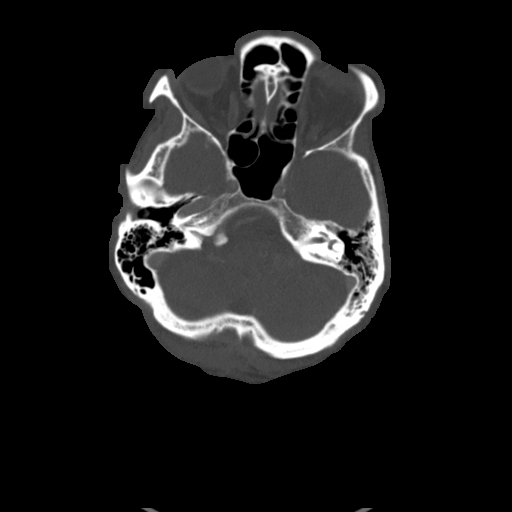

[Series 6: coronal neck · coronal · 0.49mm/px · 3 of 118 slices shown]
[im 24/118  bone]
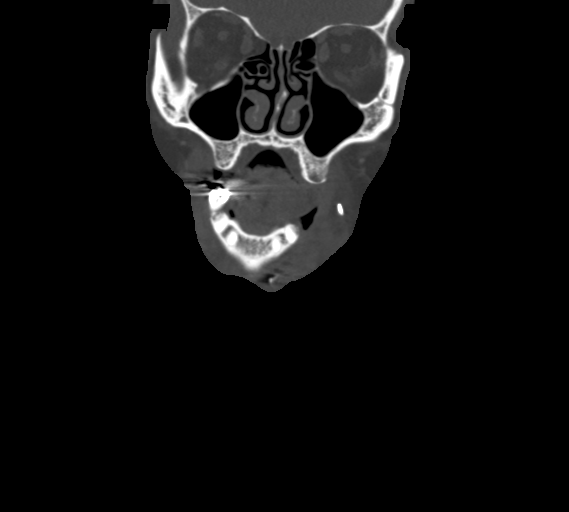
[im 47/118  bone]
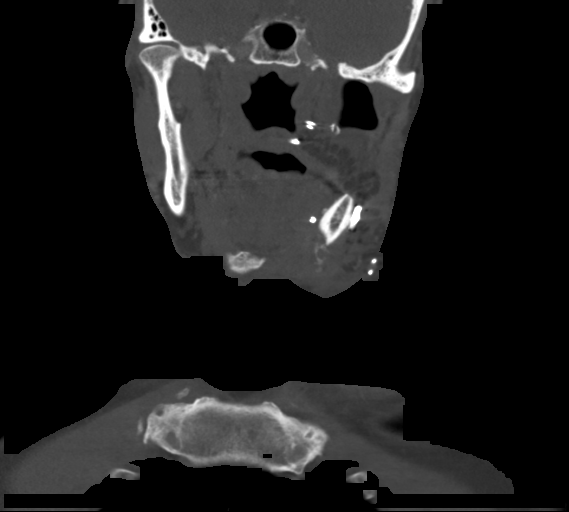
[im 71/118  bone]
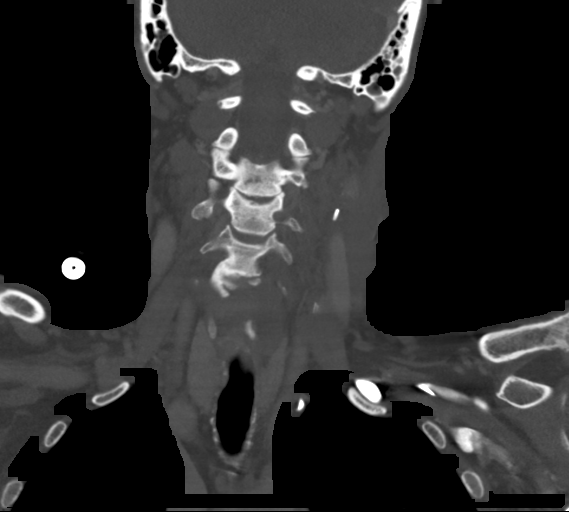

[Series 7: sagittal neck · sagittal · 0.49mm/px · 5 of 119 slices shown, 6 images]
[im 40/119  bone]
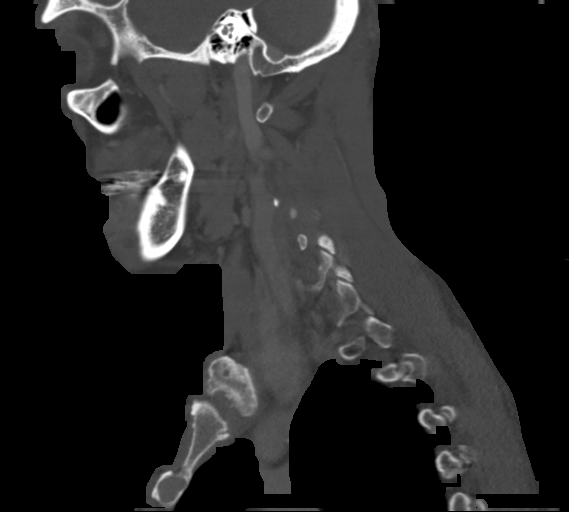
[im 50/119  bone]
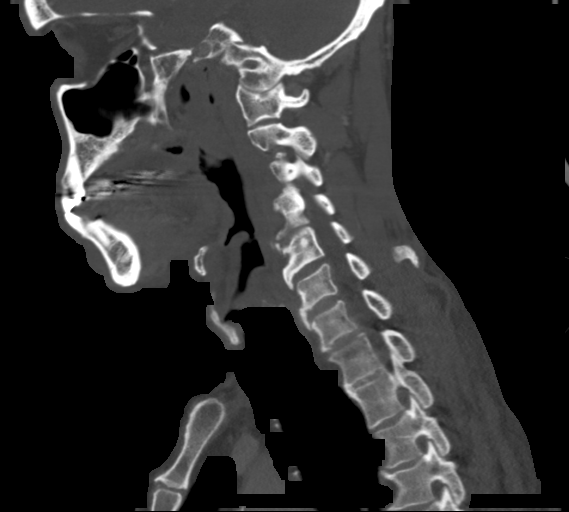
[im 60/119  soft-tissue]
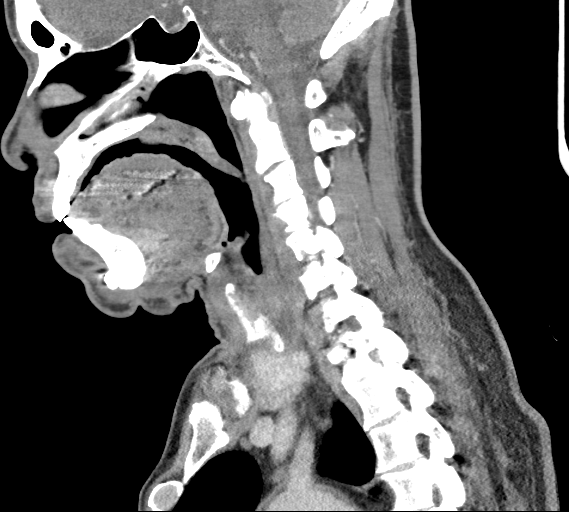
[im 60/119  bone]
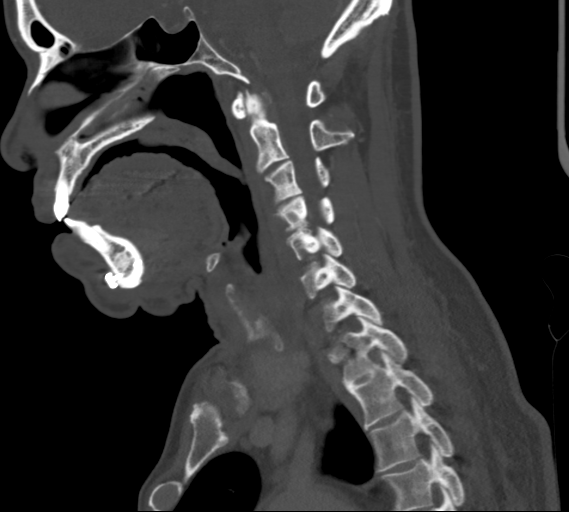
[im 69/119  bone]
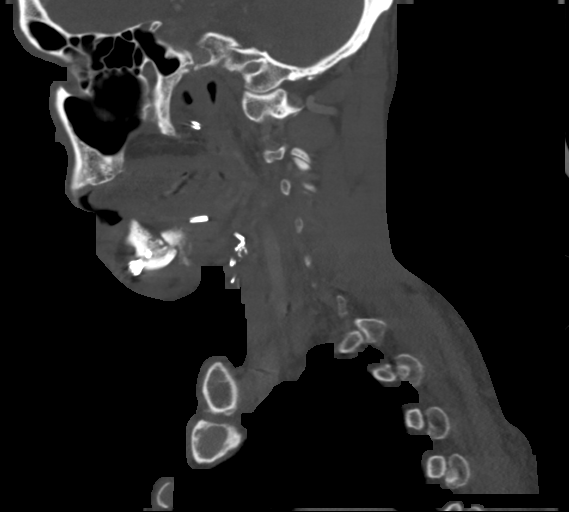
[im 79/119  bone]
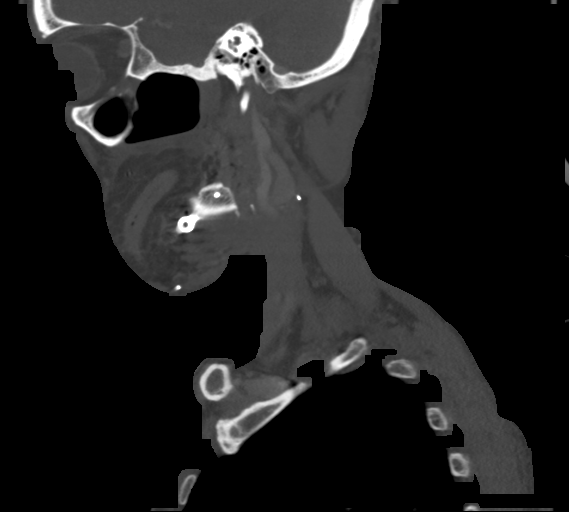

[14 of 33 positions shown; findings below may reference images not displayed]

FINDINGS: PHARYNX AND LARYNX: Postsurgical changes for section of LEFT
retromolar trigone mass with flap. Narrowed though patent LEFT
internal jugular vein. Granulation tissue LEFT neck contiguous with
the LEFT sternocleidomastoid muscle. Status post LEFT neck
dissection. No focal fluid collection, or acute inflammatory
changes. Patent airway.

SALIVARY GLANDS: Resected LEFT submandibular gland. Mildly
hypertrophic LEFT sublingual gland. Surgical clips LEFT parotid
gland with possible partial LEFT parotidectomy.

THYROID: Normal.

LYMPH NODES: No lymphadenopathy by CT size criteria.

VASCULAR: Mild calcific atherosclerosis aortic arch and carotid
bifurcations. Approximate 50% stenosis RIGHT internal carotid artery
on this non angiographic phase.

LIMITED INTRACRANIAL: Normal.

VISUALIZED ORBITS: Normal.

MASTOIDS AND VISUALIZED PARANASAL SINUSES: Resected LEFT posterior
maxillary antrum, well-aerated surgical bed extending into LEFT
infratemporal fossa. Surgical clip LEFT pterygopalatine fossa.
Paranasal sinus are well aerated. Small LEFT mastoid effusion
without air cell coalescence.

SKELETON: LEFT mandible plate and screw fixation with
reconstruction, intact well-seated hardware. Slight offset of the
posterior fusion at the angle of the mandible. Nondisplaced RIGHT
nasal bone fracture.

UPPER CHEST: Lung apices are clear. No superior mediastinal
lymphadenopathy.

OTHER: None.
IMPRESSION: 1. No acute process in the neck.
2. Status post LEFT partial maxillectomy and reconstructive changes
of LEFT mandible for head and neck tumor. LEFT upper neck flap and
LEFT modified radical neck dissection. No CT findings of residual or
recurrent tumor though, MRI would be more sensitive in the setting
of postoperative change.
3. Atherosclerosis resulting in suspected 50% stenosis RIGHT
internal carotid artery on this non angiographic phase.

## 2017-10-25 ENCOUNTER — Other Ambulatory Visit: Payer: Self-pay

## 2017-10-25 ENCOUNTER — Encounter (HOSPITAL_COMMUNITY): Payer: Self-pay

## 2017-10-25 ENCOUNTER — Ambulatory Visit (HOSPITAL_COMMUNITY): Payer: Medicare Other | Attending: Orthopaedic Surgery

## 2017-10-25 DIAGNOSIS — M25612 Stiffness of left shoulder, not elsewhere classified: Secondary | ICD-10-CM | POA: Insufficient documentation

## 2017-10-25 DIAGNOSIS — M25512 Pain in left shoulder: Secondary | ICD-10-CM | POA: Insufficient documentation

## 2017-10-25 DIAGNOSIS — M25542 Pain in joints of left hand: Secondary | ICD-10-CM | POA: Diagnosis present

## 2017-10-25 DIAGNOSIS — R29898 Other symptoms and signs involving the musculoskeletal system: Secondary | ICD-10-CM | POA: Diagnosis present

## 2017-10-25 NOTE — Therapy (Signed)
[place], [place], [address] Phone: [phone]   Fax:  [phone]  Occupational Therapy Treatment  Patient Details  Name: Colin Wagner MRN: [id number] Date of Birth: Feb 18, 1951 Referring Provider: Dr. Sanjuana Kava   Encounter Date: 10/25/2017  OT End of Session - 10/25/17 1201    Visit Number  9    Number of Visits  16    Date for OT Re-Evaluation  11/26/17 mini reassess on 10/27/17    Authorization Type  Medicare    OT Start Time  1106    OT Stop Time  1145    OT Time Calculation (min)  39 min    Activity Tolerance  Patient tolerated treatment well    Behavior During Therapy  [facility] for tasks assessed/performed       Past Medical History:  Diagnosis Date  . Alcohol abuse   . Alcoholic pancreatitis 0254   admission  . Chronic back pain   . Chronic pancreatitis ([place])    based on ct findings 2016  . COPD (chronic obstructive pulmonary disease) ([place])   . Diabetes mellitus   . Diverticulosis   . Gastritis   . GERD (gastroesophageal reflux disease)   . Headache   . History of radiation therapy 05/12/17- 06/22/17   Left cheek and bilateral neck/ 60 Gy in 30 fractions to gross disease  . Hyperlipidemia   . Hypertension   . Peptic ulcer disease 1999   Per medical reports, no H pylori  . Renal cancer, left ([place]) 2012   he tells me that he has been released, ?and that he is free of cancer, and never had it to begin with.   . Right shoulder pain   . Testicular hypofunction     Past Surgical History:  Procedure Laterality Date  . COLONOSCOPY  2003   Dr. [name], polyps  . COLONOSCOPY  2005   Dr. [name], multiple diverticula  . COLONOSCOPY  2008   Dr. [name], diverticulosis  . COLONOSCOPY WITH PROPOFOL N/A 10/21/2016   Procedure: COLONOSCOPY WITH PROPOFOL;  Surgeon: [name], MD;  [facility];  Service: Endoscopy;  Laterality: N/A;  8:30 am  . ESOPHAGOGASTRODUODENOSCOPY      Multiple EGDs. 1999 EGD showed gastric ulcers, no H pylori and benign biopsies performed by Dr. [name]. 2001 gastric ulcer healed. Last EGD 2005 had gastritis.  . Left partial mandibulectomy, Scapular free flap reconstruction, selective neck dissection, tracheotomy, and resection of intraoral palate cancer. Left 03/01/2017   [facility]  . LUNG BIOPSY    . PARTIAL NEPHRECTOMY Left 2012  . POLYPECTOMY  10/21/2016   Procedure: POLYPECTOMY;  Surgeon: [name], MD;  [facility];  Service: Endoscopy;;  hepatic flexure x2    There were no vitals filed for this visit.  Subjective Assessment - 10/25/17 1114    Subjective   S It only hurts with movement. It's sore.    Currently in Pain?  No/denies         [facility] OT Assessment - 10/25/17 1114      Assessment   Medical Diagnosis  Left Adhesive Capsulitis and Carpal Tunnel Syndrome      Precautions   Precautions  Other (comment)    Precaution Comments  history of cancer               OT Treatments/Exercises (OP) - 10/25/17 1114      Exercises  Exercises  Shoulder;Hand      Shoulder Exercises: Supine   Protraction  Strengthening;12 reps    Protraction Weight (lbs)  2    Horizontal ABduction  Strengthening;12 reps;Limitations    Horizontal ABduction Weight (lbs)  2    Horizontal ABduction Limitations  achieved 25% range of motion    External Rotation  Strengthening;12 reps    External Rotation Weight (lbs)  2    Internal Rotation  Strengthening;12 reps    Internal Rotation Weight (lbs)  2    Flexion  Strengthening;12 reps    Shoulder Flexion Weight (lbs)  2    ABduction  Strengthening;12 reps    Shoulder ABduction Weight (lbs)  1      Shoulder Exercises: Standing   Protraction  AROM;Strengthening;10 reps    Protraction Weight (lbs)  2    Horizontal ABduction  Strengthening;10 reps    Horizontal ABduction Weight (lbs)  1    External Rotation  Strengthening;10 reps     External Rotation Weight (lbs)  1    Internal Rotation  Strengthening;10 reps    Internal Rotation Weight (lbs)  2    Flexion  Strengthening;10 reps;Limitations    Shoulder Flexion Weight (lbs)  2    Flexion Limitations  Achieved slightly above 90 degrees of shoulder flexion    ABduction  Strengthening;10 reps    Shoulder ABduction Weight (lbs)  1      Shoulder Exercises: Therapy Ball   Right/Left  5 reps      Shoulder Exercises: ROM/Strengthening   Over Head Lace  2'    Proximal Shoulder Strengthening, Supine  10X 1# no rest breaks    Proximal Shoulder Strengthening, Seated  10X no rest breaks    Other ROM/Strengthening Exercises  PVC pipe slide; 12X flexion 12X abduction      Functional Reaching Activities   High Level  Functional reaching task completed using 10 cones. Pt placed cones on second shelf while completing shoulder abduction. Pt then removed cones from 2nd shelf while completing shoulder flexion. Min difficulty.  Ptt. placed 10 cones on the third shelf while completing shoulder flexion. Pt. removed 10 cones from third shelf while completing  in the plane of scaption.               OT Short Term Goals - 10/03/17 1624      OT SHORT TERM GOAL #1   Title  Patient will be educated on a HEP for improved mobility and strength in his left shoulder and hand in order to reach into overhead cabinets at home.     Time  4    Period  Weeks    Status  On-going      OT SHORT TERM GOAL #2   Title  Patient will improve left shoulder P/ROM to [facility] in order to don and doff clothing with greater independence.     Time  4    Period  Weeks    Status  On-going      OT SHORT TERM GOAL #3   Title  Patient will improve left shoulder strength to 4/5 for greater ability to lift tools when working in his yard.     Time  4    Period  Weeks    Status  On-going      OT SHORT TERM GOAL #4   Title  Patient will imrove left grip strength by 15 pounds and pinch strength by 2 pounds for  greater ability to open jars  and containers.     Time  4    Period  Weeks    Status  On-going      OT SHORT TERM GOAL #5   Title  Patient will decrease pain in his left shoulder and hand to 4/10 or better when completing daily activities.     Time  4    Period  Weeks    Status  On-going      OT SHORT TERM GOAL #6   Title  Patient will decrease fascial and scar restrictions in left shoulder region from max to mod max for improved moblity needed to use left arm actively with daily activities.    Time  4    Period  Weeks    Status  On-going        OT Long Term Goals - 10/03/17 1624      OT LONG TERM GOAL #1   Title  Patient will use his left arm actively wtih all daily and leisure activities without hesitation or compensatory movements.     Time  8    Period  Weeks    Status  On-going      OT LONG TERM GOAL #2   Title  Patient will improve left shoulder A/ROM to [facility] in order to reach overhead when putting groceries away and retrieving items from cabinets.     Time  8    Period  Weeks    Status  On-going      OT LONG TERM GOAL #3   Title  Patient will improve left shoulder strength to 4+/5 or better for improved ability to lift gardening tools and bags of groceries.     Time  8    Period  Weeks    Status  On-going      OT LONG TERM GOAL #4   Title  Patient will improve left hand grip strength to 60# or more and pinch strength to 14 pounds or more for improved ability to maintain grip on household objects.     Time  8    Period  Weeks    Status  On-going      OT LONG TERM GOAL #5   Title  Patient will decrease pain in his left hand and shoulder to 2/10 or better when completing functional activities.     Time  8    Period  Weeks    Status  On-going      OT LONG TERM GOAL #6   Title  Patient will decrease scar restrictions and fascial restrictions in his left scapular and forearm regions for improved mobility required to complete ADLs.    Time  8    Period  Weeks     Status  On-going            Plan - 10/25/17 1202    Clinical Impression Statement  A: Session focused on increasing strength through AROM exercises with weights and functional reaching to help increase ROM and strength.  Patient alternated weights between 1# and 2# depending on exercise. VC for form and technique.    Plan  P: Myofascial release PRN. Omit P/ROM. Due for mini reassessment.     Consulted and Agree with Plan of Care  Patient       Patient will benefit from skilled therapeutic intervention in order to improve the following deficits and impairments:  Pain, Decreased scar mobility, Increased fascial restrictions, Decreased range of motion, Decreased strength, Increased muscle spasms, Impaired UE  functional use  Visit Diagnosis: Stiffness of left shoulder, not elsewhere classified  Acute pain of left shoulder  Other symptoms and signs involving the musculoskeletal system  Pain in joint of left hand    Problem List Patient Active Problem List   Diagnosis Date Noted  . UTI (urinary tract infection) 04/20/2017  . UTI due to Klebsiella species 04/20/2017  . Essential hypertension 04/20/2017  . Chronic pancreatitis ([place]) 04/20/2017  . 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 Malnutrition of moderate degree 04/20/2017  . Complicated UTI (urinary tract infection)   . 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 Rectal bleeding 09/28/2016  . Hyponatremia 04/28/2015  . Abdominal pain 04/27/2015  . Acute pancreatitis 04/27/2015  . Chronic alcoholic pancreatitis ([place]) 04/27/2015  . ETOH abuse 04/27/2015  . DM type 2 (diabetes mellitus, type 2) ([place]) 04/27/2015  . Hypertension, uncontrolled 04/27/2015  . Hyperlipidemia 04/27/2015  . Hypertensive urgency 04/27/2015  . Alcohol abuse    Ailene Ravel, OTR/L,CBIS  [phone]  10/25/2017, 12:13 PM  [place] [address] [place], [place],  [address] Phone: [phone]   Fax:  [phone]  Name: Colin Wagner MRN: [id number] Date of Birth: Jan 18, 1951

## 2017-10-28 ENCOUNTER — Ambulatory Visit (HOSPITAL_COMMUNITY): Payer: Medicare Other | Admitting: Occupational Therapy

## 2017-10-28 ENCOUNTER — Encounter (HOSPITAL_COMMUNITY): Payer: Self-pay | Admitting: Occupational Therapy

## 2017-10-28 DIAGNOSIS — R29898 Other symptoms and signs involving the musculoskeletal system: Secondary | ICD-10-CM

## 2017-10-28 DIAGNOSIS — M25512 Pain in left shoulder: Secondary | ICD-10-CM

## 2017-10-28 DIAGNOSIS — M25612 Stiffness of left shoulder, not elsewhere classified: Secondary | ICD-10-CM

## 2017-10-28 NOTE — Therapy (Signed)
[place], [place], [address] Phone: [phone]   Fax:  [phone]  Occupational Therapy Treatment  Patient Details  Name: Colin Wagner MRN: [id number] Date of Birth: [birth year] Referring Provider: Dr. Sanjuana Kava   Encounter Date: 10/28/2017  OT End of Session - 10/28/17 1111    Visit Number  10    Number of Visits  16    Date for OT Re-Evaluation  11/26/17    Authorization Type  Medicare    OT Start Time  1025    OT Stop Time  1108    OT Time Calculation (min)  43 min    Activity Tolerance  Patient tolerated treatment well    Behavior During Therapy  [facility] for tasks assessed/performed       Past Medical History:  Diagnosis Date  . Alcohol abuse   . Alcoholic pancreatitis 5400   admission  . Chronic back pain   . Chronic pancreatitis ([place])    based on ct findings 2016  . COPD (chronic obstructive pulmonary disease) ([place])   . Diabetes mellitus   . Diverticulosis   . Gastritis   . GERD (gastroesophageal reflux disease)   . Headache   . History of radiation therapy 05/12/17- 06/22/17   Left cheek and bilateral neck/ 60 Gy in 30 fractions to gross disease  . Hyperlipidemia   . Hypertension   . Peptic ulcer disease 1999   Per medical reports, no H pylori  . Renal cancer, left ([place]) 2012   he tells me that he has been released, ?and that he is free of cancer, and never had it to begin with.   . Right shoulder pain   . Testicular hypofunction     Past Surgical History:  Procedure Laterality Date  . COLONOSCOPY  2003   Dr. [name], polyps  . COLONOSCOPY  2005   Dr. [name], multiple diverticula  . COLONOSCOPY  2008   Dr. [name], diverticulosis  . COLONOSCOPY WITH PROPOFOL N/A 10/21/2016   Procedure: COLONOSCOPY WITH PROPOFOL;  Surgeon: [name], MD;  [facility];  Service: Endoscopy;  Laterality: N/A;  8:30 am  . ESOPHAGOGASTRODUODENOSCOPY     Multiple EGDs. 1999 EGD  showed gastric ulcers, no H pylori and benign biopsies performed by Dr. [name]. 2001 gastric ulcer healed. Last EGD 2005 had gastritis.  . Left partial mandibulectomy, Scapular free flap reconstruction, selective neck dissection, tracheotomy, and resection of intraoral palate cancer. Left 03/01/2017   [facility]  . LUNG BIOPSY    . PARTIAL NEPHRECTOMY Left 2012  . POLYPECTOMY  10/21/2016   Procedure: POLYPECTOMY;  Surgeon: [name], MD;  [facility];  Service: Endoscopy;;  hepatic flexure x2    There were no vitals filed for this visit.  Subjective Assessment - 10/28/17 1021    Subjective   S: It's feeling sore today, I need some stretching maybe.     Currently in Pain?  Yes    Pain Score  6     Pain Location  Shoulder    Pain Orientation  Left    Pain Descriptors / Indicators  Sore    Pain Type  Chronic pain    Pain Radiating Towards  n/a    Pain Onset  In the past 7 days    Pain Frequency  Intermittent    Aggravating Factors   reaching above shoulder level    Pain  Relieving Factors  heat    Effect of Pain on Daily Activities  min effect    Multiple Pain Sites  No         [facility] OT Assessment - 10/28/17 1021      Assessment   Medical Diagnosis  Left Adhesive Capsulitis and Carpal Tunnel Syndrome      Precautions   Precautions  Other (comment)    Precaution Comments  history of cancer      AROM   Overall AROM Comments  assessed in seated, external and internal rotation with shoulder adducted.  Wrist A/ROM is [facility]    AROM Assessment Site  Shoulder    Left Shoulder Flexion  100 Degrees 90 previous    Left Shoulder ABduction  100 Degrees 60 previous    Left Shoulder Internal Rotation  90 Degrees 85 previous    Left Shoulder External Rotation  30 Degrees 0 degrees      PROM   Overall PROM Comments  assessed in supine, wrist P/ROM is [facility]    PROM Assessment Site  Shoulder    Left Shoulder Flexion  105 Degrees 60 previous    Left  Shoulder ABduction  102 Degrees 65 previous    Left Shoulder Internal Rotation  90 Degrees 85 previous    Left Shoulder External Rotation  35 Degrees 10 previous      Strength   Overall Strength Comments  assessed in seated    Strength Assessment Site  Shoulder    Left Shoulder Flexion  4/5 3+/5 previous    Left Shoulder ABduction  4-/5 3+/5 previous    Left Shoulder Internal Rotation  4+/5 4-/5 previous    Left Shoulder External Rotation  4-/5 same as previous      Hand Function   Left Hand Gross Grasp  Functional    Left Hand Grip (lbs)  50 30 previous    Left Hand Lateral Pinch  18 lbs 12 previous    Left 3 point pinch  12 lbs 10 previous               OT Treatments/Exercises (OP) - 10/28/17 1036      Exercises   Exercises  Shoulder      Shoulder Exercises: Supine   Protraction  Strengthening;12 reps    Protraction Weight (lbs)  2    Horizontal ABduction  Strengthening;12 reps;Limitations    Horizontal ABduction Weight (lbs)  2    Horizontal ABduction Limitations  achieved 25% range of motion    External Rotation  Strengthening;12 reps    External Rotation Weight (lbs)  2    Internal Rotation  Strengthening;12 reps    Internal Rotation Weight (lbs)  2    Flexion  Strengthening;12 reps    Shoulder Flexion Weight (lbs)  2    ABduction  Strengthening;12 reps    Shoulder ABduction Weight (lbs)  1      Shoulder Exercises: Standing   Protraction  Strengthening;10 reps    Protraction Weight (lbs)  2    Horizontal ABduction  Strengthening;10 reps    Horizontal ABduction Weight (lbs)  1    External Rotation  Strengthening;10 reps    External Rotation Weight (lbs)  2    Internal Rotation  Strengthening;10 reps    Internal Rotation Weight (lbs)  2    Flexion  Strengthening;10 reps;Limitations    Shoulder Flexion Weight (lbs)  2    Flexion Limitations  Achieved slightly above 90 degrees of shoulder flexion  ABduction  Strengthening;10 reps    Shoulder ABduction  Weight (lbs)  1    Extension  Theraband;12 reps    Theraband Level (Shoulder Extension)  Level 2 (Red)    Row  Theraband;12 reps    Theraband Level (Shoulder Row)  Level 2 (Red)    Retraction  Theraband;12 reps    Theraband Level (Shoulder Retraction)  Level 2 (Red)      Shoulder Exercises: Therapy Ball   Other Therapy Ball Exercises  pt used large pink ball to pass around waist working in [employer], 5x each direction      Shoulder Exercises: ROM/Strengthening   Proximal Shoulder Strengthening, Supine  10X 1# no rest breaks    Proximal Shoulder Strengthening, Seated  10X no rest breaks    Other ROM/Strengthening Exercises  PVC pipe slide; 12X flexion 12X abduction      Shoulder Exercises: Stretch   Corner Stretch  2 reps;10 seconds      Functional Reaching Activities   High Level  Functional reaching task completed using 10 cones. Pt placed cones on second shelf while completing shoulder abduction. Pt then removed cones from 2nd shelf while completing shoulder flexion. Min difficulty.  Ptt. placed 10 cones on the third shelf while completing shoulder flexion. Pt. removed 10 cones from third shelf while completing  in the plane of scaption.               OT Short Term Goals - 10/28/17 1111      OT SHORT TERM GOAL #1   Title  Patient will be educated on a HEP for improved mobility and strength in his left shoulder and hand in order to reach into overhead cabinets at home.     Time  4    Period  Weeks    Status  On-going      OT SHORT TERM GOAL #2   Title  Patient will improve left shoulder P/ROM to [facility] in order to don and doff clothing with greater independence.     Time  4    Period  Weeks    Status  On-going      OT SHORT TERM GOAL #3   Title  Patient will improve left shoulder strength to 4/5 for greater ability to lift tools when working in his yard.     Time  4    Period  Weeks    Status  Partially Met      OT SHORT TERM GOAL #4   Title  Patient will imrove left grip  strength by 15 pounds and pinch strength by 2 pounds for greater ability to open jars and containers.     Time  4    Period  Weeks    Status  Achieved      OT SHORT TERM GOAL #5   Title  Patient will decrease pain in his left shoulder and hand to 4/10 or better when completing daily activities.     Time  4    Period  Weeks    Status  On-going      OT SHORT TERM GOAL #6   Title  Patient will decrease fascial and scar restrictions in left shoulder region from max to mod max for improved moblity needed to use left arm actively with daily activities.    Time  4    Period  Weeks    Status  On-going        OT Long Term Goals - 10/03/17 1624  OT LONG TERM GOAL #1   Title  Patient will use his left arm actively wtih all daily and leisure activities without hesitation or compensatory movements.     Time  8    Period  Weeks    Status  On-going      OT LONG TERM GOAL #2   Title  Patient will improve left shoulder A/ROM to [facility] in order to reach overhead when putting groceries away and retrieving items from cabinets.     Time  8    Period  Weeks    Status  On-going      OT LONG TERM GOAL #3   Title  Patient will improve left shoulder strength to 4+/5 or better for improved ability to lift gardening tools and bags of groceries.     Time  8    Period  Weeks    Status  On-going      OT LONG TERM GOAL #4   Title  Patient will improve left hand grip strength to 60# or more and pinch strength to 14 pounds or more for improved ability to maintain grip on household objects.     Time  8    Period  Weeks    Status  On-going      OT LONG TERM GOAL #5   Title  Patient will decrease pain in his left hand and shoulder to 2/10 or better when completing functional activities.     Time  8    Period  Weeks    Status  On-going      OT LONG TERM GOAL #6   Title  Patient will decrease scar restrictions and fascial restrictions in his left scapular and forearm regions for improved mobility  required to complete ADLs.    Time  8    Period  Weeks    Status  On-going            Plan - 10/28/17 1112    Clinical Impression Statement  A: Mini-reassessment completed this session, pt has met 1 STG and partially met an additional STG and is making progress towards remaining goals. Pt was able to reach top shelf of cabinet today with min difficulty, added corner stretch as pt reports some soreness in anterior shoulder region. Verbal cuing for form and technique during exercises.     Plan  P: add shoulder stretches and add to HEP       Patient will benefit from skilled therapeutic intervention in order to improve the following deficits and impairments:  Pain, Decreased scar mobility, Increased fascial restrictions, Decreased range of motion, Decreased strength, Increased muscle spasms, Impaired UE functional use  Visit Diagnosis: Stiffness of left shoulder, not elsewhere classified  Acute pain of left shoulder  Other symptoms and signs involving the musculoskeletal system    Problem List Patient Active Problem List   Diagnosis Date Noted  . UTI (urinary tract infection) 04/20/2017  . UTI due to Klebsiella species 04/20/2017  . Essential hypertension 04/20/2017  . Chronic pancreatitis ([place]) 04/20/2017  . 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 Malnutrition of moderate degree 04/20/2017  . Complicated UTI (urinary tract infection)   . 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 Rectal bleeding 09/28/2016  . Hyponatremia 04/28/2015  . Abdominal pain 04/27/2015  . Acute pancreatitis 04/27/2015  . Chronic alcoholic pancreatitis ([place]) 04/27/2015  . ETOH abuse 04/27/2015  . DM type 2 (diabetes mellitus, type 2) ([place]) 04/27/2015  . Hypertension, uncontrolled  04/27/2015  . Hyperlipidemia 04/27/2015  . Hypertensive urgency 04/27/2015  . Alcohol abuse    Guadelupe Sabin, OTR/L  [phone] 10/28/2017, 12:11 PM  [place] [address] [place], [place], [address] Phone: [phone]   Fax:  [phone]  Name: Colin Wagner MRN: [id number] Date of Birth: 1951/08/03

## 2017-11-01 ENCOUNTER — Ambulatory Visit (HOSPITAL_COMMUNITY): Payer: Medicare Other

## 2017-11-01 DIAGNOSIS — M25612 Stiffness of left shoulder, not elsewhere classified: Secondary | ICD-10-CM | POA: Diagnosis not present

## 2017-11-01 DIAGNOSIS — M25512 Pain in left shoulder: Secondary | ICD-10-CM

## 2017-11-01 DIAGNOSIS — M25542 Pain in joints of left hand: Secondary | ICD-10-CM

## 2017-11-01 DIAGNOSIS — R29898 Other symptoms and signs involving the musculoskeletal system: Secondary | ICD-10-CM

## 2017-11-01 NOTE — Therapy (Signed)
[place], [place], [address] Phone: [phone]   Fax:  [phone]  Occupational Therapy Treatment  Patient Details  Name: Colin Wagner MRN: [id number] Date of Birth: 10/04/1950 Referring Provider: Dr. Sanjuana Kava   Encounter Date: 11/01/2017  OT End of Session - 11/01/17 1204    Visit Number  11    Number of Visits  16    Date for OT Re-Evaluation  11/26/17    Authorization Type  Medicare    OT Start Time  1125    OT Stop Time  1203    OT Time Calculation (min)  38 min    Activity Tolerance  Patient tolerated treatment well    Behavior During Therapy  [facility] for tasks assessed/performed       Past Medical History:  Diagnosis Date  . Alcohol abuse   . Alcoholic pancreatitis 7209   admission  . Chronic back pain   . Chronic pancreatitis ([place])    based on ct findings 2016  . COPD (chronic obstructive pulmonary disease) ([place])   . Diabetes mellitus   . Diverticulosis   . Gastritis   . GERD (gastroesophageal reflux disease)   . Headache   . History of radiation therapy 05/12/17- 06/22/17   Left cheek and bilateral neck/ 60 Gy in 30 fractions to gross disease  . Hyperlipidemia   . Hypertension   . Peptic ulcer disease 1999   Per medical reports, no H pylori  . Renal cancer, left ([place]) 2012   he tells me that he has been released, ?and that he is free of cancer, and never had it to begin with.   . Right shoulder pain   . Testicular hypofunction     Past Surgical History:  Procedure Laterality Date  . COLONOSCOPY  2003   Dr. [name], polyps  . COLONOSCOPY  2005   Dr. [name], multiple diverticula  . COLONOSCOPY  2008   Dr. [name], diverticulosis  . COLONOSCOPY WITH PROPOFOL N/A 10/21/2016   Procedure: COLONOSCOPY WITH PROPOFOL;  Surgeon: [name], MD;  [facility];  Service: Endoscopy;  Laterality: N/A;  8:30 am  . ESOPHAGOGASTRODUODENOSCOPY     Multiple EGDs. 1999 EGD  showed gastric ulcers, no H pylori and benign biopsies performed by Dr. [name]. 2001 gastric ulcer healed. Last EGD 2005 had gastritis.  . Left partial mandibulectomy, Scapular free flap reconstruction, selective neck dissection, tracheotomy, and resection of intraoral palate cancer. Left 03/01/2017   [facility]  . LUNG BIOPSY    . PARTIAL NEPHRECTOMY Left 2012  . POLYPECTOMY  10/21/2016   Procedure: POLYPECTOMY;  Surgeon: [name], MD;  Location[facility];  Service: Endoscopy;;  hepatic flexure x2    There were no vitals filed for this visit.      [facility] OT Assessment - 11/01/17 1127      Assessment   Medical Diagnosis  Left Adhesive Capsulitis and Carpal Tunnel Syndrome      Precautions   Precautions  Other (comment)    Precaution Comments  history of cancer               OT Treatments/Exercises (OP) - 11/01/17 1127      Exercises   Exercises  Shoulder      Shoulder Exercises: Standing   Protraction  Strengthening;10 reps    Protraction Weight (lbs)  2    Horizontal ABduction  Strengthening;10 reps  Horizontal ABduction Weight (lbs)  1    External Rotation  Strengthening;10 reps    External Rotation Weight (lbs)  2    Internal Rotation  Strengthening;10 reps    Internal Rotation Weight (lbs)  2    Flexion  Strengthening;10 reps;Limitations    Shoulder Flexion Weight (lbs)  2    Flexion Limitations  Achieved slightly above 90 degrees of shoulder flexion    ABduction  Strengthening;10 reps    Shoulder ABduction Weight (lbs)  1      Shoulder Exercises: ROM/Strengthening   UBE (Upper Arm Bike)  Level 2 2' forward 2' reverse pace: 3.5-4.0    Cybex Press  2 plate;10 reps    Cybex Row  2 plate;10 reps    Wall Pushups  10 reps fists versus flat palm    X to V Arms  10X with 1#    Other ROM/Strengthening Exercises  PVC pipe slide; 15X flexion 15X abduction      Shoulder Exercises: Stretch   Internal Rotation Stretch  2  reps 30"    Wall Stretch - Flexion  2 reps;30 seconds    Other Shoulder Stretches  Doorway stretch 2x30"    Other Shoulder Stretches  Pectoralis stretch using wall; 2x30"             OT Education - 11/01/17 1204    Education provided  Yes    Education Details  Shoulder stretches    Person(s) Educated  Patient    Methods  Explanation;Demonstration;Handout;Verbal cues    Comprehension  Verbalized understanding;Returned demonstration       OT Short Term Goals - 11/01/17 1208      OT SHORT TERM GOAL #1   Title  Patient will be educated on a HEP for improved mobility and strength in his left shoulder and hand in order to reach into overhead cabinets at home.     Time  4    Period  Weeks    Status  On-going      OT SHORT TERM GOAL #2   Title  Patient will improve left shoulder P/ROM to [facility] in order to don and doff clothing with greater independence.     Time  4    Period  Weeks    Status  On-going      OT SHORT TERM GOAL #3   Title  Patient will improve left shoulder strength to 4/5 for greater ability to lift tools when working in his yard.     Time  4    Period  Weeks    Status  Partially Met      OT SHORT TERM GOAL #4   Title  Patient will imrove left grip strength by 15 pounds and pinch strength by 2 pounds for greater ability to open jars and containers.     Time  4    Period  Weeks      OT SHORT TERM GOAL #5   Title  Patient will decrease pain in his left shoulder and hand to 4/10 or better when completing daily activities.     Time  4    Period  Weeks    Status  On-going      OT SHORT TERM GOAL #6   Title  Patient will decrease fascial and scar restrictions in left shoulder region from max to mod max for improved moblity needed to use left arm actively with daily activities.    Time  4    Period  Weeks    Status  On-going        OT Long Term Goals - 10/03/17 1624      OT LONG TERM GOAL #1   Title  Patient will use his left arm actively wtih all daily  and leisure activities without hesitation or compensatory movements.     Time  8    Period  Weeks    Status  On-going      OT LONG TERM GOAL #2   Title  Patient will improve left shoulder A/ROM to [facility] in order to reach overhead when putting groceries away and retrieving items from cabinets.     Time  8    Period  Weeks    Status  On-going      OT LONG TERM GOAL #3   Title  Patient will improve left shoulder strength to 4+/5 or better for improved ability to lift gardening tools and bags of groceries.     Time  8    Period  Weeks    Status  On-going      OT LONG TERM GOAL #4   Title  Patient will improve left hand grip strength to 60# or more and pinch strength to 14 pounds or more for improved ability to maintain grip on household objects.     Time  8    Period  Weeks    Status  On-going      OT LONG TERM GOAL #5   Title  Patient will decrease pain in his left hand and shoulder to 2/10 or better when completing functional activities.     Time  8    Period  Weeks    Status  On-going      OT LONG TERM GOAL #6   Title  Patient will decrease scar restrictions and fascial restrictions in his left scapular and forearm regions for improved mobility required to complete ADLs.    Time  8    Period  Weeks    Status  On-going            Plan - 11/01/17 1206    Clinical Impression Statement  A: Added shoulder stretches to HEP this session. Also added cybex row and press and wall push ups to focus on scapular mobility and shoulder strength. VC for form and technique.    Plan  P: Add ball on the wall.     Consulted and Agree with Plan of Care  Patient       Patient will benefit from skilled therapeutic intervention in order to improve the following deficits and impairments:  Pain, Decreased scar mobility, Increased fascial restrictions, Decreased range of motion, Decreased strength, Increased muscle spasms, Impaired UE functional use  Visit Diagnosis: Stiffness of left shoulder,  not elsewhere classified  Acute pain of left shoulder  Other symptoms and signs involving the musculoskeletal system  Pain in joint of left hand    Problem List Patient Active Problem List   Diagnosis Date Noted  . UTI (urinary tract infection) 04/20/2017  . UTI due to Klebsiella species 04/20/2017  . Essential hypertension 04/20/2017  . Chronic pancreatitis ([place]) 04/20/2017  . 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 Malnutrition of moderate degree 04/20/2017  . Complicated UTI (urinary tract infection)   . 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 Rectal bleeding 09/28/2016  . Hyponatremia 04/28/2015  . Abdominal pain 04/27/2015  . Acute pancreatitis 04/27/2015  . Chronic alcoholic pancreatitis ([place]) 04/27/2015  .  ETOH abuse 04/27/2015  . DM type 2 (diabetes mellitus, type 2) ([place]) 04/27/2015  . Hypertension, uncontrolled 04/27/2015  . Hyperlipidemia 04/27/2015  . Hypertensive urgency 04/27/2015  . Alcohol abuse    Ailene Ravel, OTR/L,CBIS  [phone]  11/01/2017, 12:10 PM  [place] [address] [place], [place], [address] Phone: [phone]   Fax:  [phone]  Name: Colin Wagner MRN: [id number] Date of Birth: Sep 14, 1950

## 2017-11-01 NOTE — Patient Instructions (Signed)
Complete the following exercises 2-3 times a day.  Doorway Stretch  Place each hand opposite each other on the doorway. (You can change where you feel the stretch by moving arms higher or lower.) Step through with one foot and bend front knee until a stretch is felt and hold. Step through with the opposite foot on the next rep. Hold for __30___ seconds. Repeat __2__times.       Internal Rotation Across Back  Grab the end of a towel with your affected side, palm facing backwards. Grab the towel with your unaffected side and pull your affected hand across your back until you feel a stretch in the front of your shoulder. If you feel pain, pull just to the pain, do not pull through the pain. Hold. Return your affected arm to your side. Try to keep your hand/arm close to your body during the entire movement.     Hold for 30 seconds. Complete 2 times.          Wall Flexion  Slide your arm up the wall or door frame until a stretch is felt in your shoulder . Hold for 30 seconds. Complete 2 times

## 2017-11-02 ENCOUNTER — Encounter: Payer: Self-pay | Admitting: Orthopedic Surgery

## 2017-11-02 ENCOUNTER — Ambulatory Visit (INDEPENDENT_AMBULATORY_CARE_PROVIDER_SITE_OTHER): Payer: Medicare Other | Admitting: Orthopedic Surgery

## 2017-11-02 VITALS — BP 133/67 | HR 85 | Ht 71.0 in | Wt 184.0 lb

## 2017-11-02 DIAGNOSIS — G5622 Lesion of ulnar nerve, left upper limb: Secondary | ICD-10-CM | POA: Diagnosis not present

## 2017-11-02 DIAGNOSIS — M7502 Adhesive capsulitis of left shoulder: Secondary | ICD-10-CM

## 2017-11-02 DIAGNOSIS — M542 Cervicalgia: Secondary | ICD-10-CM

## 2017-11-02 DIAGNOSIS — G5602 Carpal tunnel syndrome, left upper limb: Secondary | ICD-10-CM

## 2017-11-02 NOTE — Patient Instructions (Signed)
Continue OT

## 2017-11-02 NOTE — Progress Notes (Signed)
Encounter Diagnoses  Name Primary?  . Adhesive capsulitis of left shoulder Yes  . Carpal tunnel syndrome of left wrist   . Neck pain   . 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  He says his hand is much better he is making some improvement in shoulder flexion  He says he has anterior pain over the joint line  His exam shows external rotation with his arm at his side is 0 degrees passive and active flexion is 110 degrees  Recommend continue physical therapy and return in 8 weeks

## 2017-11-03 ENCOUNTER — Other Ambulatory Visit: Payer: Self-pay

## 2017-11-03 ENCOUNTER — Encounter (HOSPITAL_COMMUNITY): Payer: Self-pay

## 2017-11-03 ENCOUNTER — Ambulatory Visit (HOSPITAL_COMMUNITY): Payer: Medicare Other

## 2017-11-03 DIAGNOSIS — M25612 Stiffness of left shoulder, not elsewhere classified: Secondary | ICD-10-CM | POA: Diagnosis not present

## 2017-11-03 DIAGNOSIS — M25512 Pain in left shoulder: Secondary | ICD-10-CM

## 2017-11-03 DIAGNOSIS — R29898 Other symptoms and signs involving the musculoskeletal system: Secondary | ICD-10-CM

## 2017-11-03 NOTE — Therapy (Signed)
[place], [place], [address] Phone: [phone]   Fax:  [phone]  Occupational Therapy Treatment  Patient Details  Name: Colin Wagner MRN: [id number] Date of Birth: [birth year] Referring Provider: Dr. Sanjuana Kava   Encounter Date: 11/03/2017  OT End of Session - 11/03/17 1415    Visit Number  12    Number of Visits  16    Date for OT Re-Evaluation  11/26/17    Authorization Type  Medicare    OT Start Time  1120    OT Stop Time  1200    OT Time Calculation (min)  40 min    Activity Tolerance  Patient tolerated treatment well    Behavior During Therapy  [facility] for tasks assessed/performed       Past Medical History:  Diagnosis Date  . Alcohol abuse   . Alcoholic pancreatitis 7619   admission  . Chronic back pain   . Chronic pancreatitis ([place])    based on ct findings 2016  . COPD (chronic obstructive pulmonary disease) ([place])   . Diabetes mellitus   . Diverticulosis   . Gastritis   . GERD (gastroesophageal reflux disease)   . Headache   . History of radiation therapy 05/12/17- 06/22/17   Left cheek and bilateral neck/ 60 Gy in 30 fractions to gross disease  . Hyperlipidemia   . Hypertension   . Peptic ulcer disease 1999   Per medical reports, no H pylori  . Renal cancer, left ([place]) 2012   he tells me that he has been released, ?and that he is free of cancer, and never had it to begin with.   . Right shoulder pain   . Testicular hypofunction     Past Surgical History:  Procedure Laterality Date  . COLONOSCOPY  2003   Dr. [name], polyps  . COLONOSCOPY  2005   Dr. [name], multiple diverticula  . COLONOSCOPY  2008   Dr. [name], diverticulosis  . COLONOSCOPY WITH PROPOFOL N/A 10/21/2016   Procedure: COLONOSCOPY WITH PROPOFOL;  Surgeon: [name], MD;  [facility];  Service: Endoscopy;  Laterality: N/A;  8:30 am  . ESOPHAGOGASTRODUODENOSCOPY     Multiple EGDs. 1999 EGD  showed gastric ulcers, no H pylori and benign biopsies performed by Dr. [name]. 2001 gastric ulcer healed. Last EGD 2005 had gastritis.  . Left partial mandibulectomy, Scapular free flap reconstruction, selective neck dissection, tracheotomy, and resection of intraoral palate cancer. Left 03/01/2017   [facility]  . LUNG BIOPSY    . PARTIAL NEPHRECTOMY Left 2012  . POLYPECTOMY  10/21/2016   Procedure: POLYPECTOMY;  Surgeon: [name], MD;  [facility];  Service: Endoscopy;;  hepatic flexure x2    There were no vitals filed for this visit.  Subjective Assessment - 11/03/17 1143    Subjective   S: The doctor said to keep going with therapy.    Currently in Pain?  Yes    Pain Score  5     Pain Location  Shoulder    Pain Orientation  Left    Pain Descriptors / Indicators  Sore    Pain Type  Chronic pain    Pain Radiating Towards  N/A    Pain Onset  1 to 4 weeks ago    Pain Frequency  Constant    Aggravating Factors   movement and reaching above shoulder level    Pain  Relieving Factors  heat, aspercreme    Effect of Pain on Daily Activities  min effect    Multiple Pain Sites  No         [facility] OT Assessment - 11/03/17 1141      Assessment   Medical Diagnosis  Left Adhesive Capsulitis and Carpal Tunnel Syndrome      Precautions   Precautions  Other (comment)    Precaution Comments  history of cancer               OT Treatments/Exercises (OP) - 11/03/17 1129      Exercises   Exercises  Shoulder      Shoulder Exercises: Standing   Horizontal ABduction  Strengthening;12 reps    Horizontal ABduction Weight (lbs)  1    Flexion  Strengthening;12 reps;Limitations    Shoulder Flexion Weight (lbs)  2    ABduction  Strengthening;12 reps    Shoulder ABduction Weight (lbs)  1      Shoulder Exercises: ROM/Strengthening   UBE (Upper Arm Bike)  Level 2 2' forward 2' reverse pace: 3.5-4.0    Cybex Press  2 plate 12X    Cybex Row  2  plate 12X    Over Head Lace  2' with 1# wrist weight    Wall Pushups  -- fisted; 12X    Ball on Wall  1' flexion 1' abduction modified with 1# wrist weight and fist on washcloth      Shoulder Exercises: Stretch   Internal Rotation Stretch  2 reps 30" horizontal towel    External Rotation Stretch  2 reps;30 seconds using long pvc pipe    Wall Stretch - Flexion  3 reps;30 seconds    Other Shoulder Stretches  Pectoralis stretch using wall; 3x30"               OT Short Term Goals - 11/01/17 1208      OT SHORT TERM GOAL #1   Title  Patient will be educated on a HEP for improved mobility and strength in his left shoulder and hand in order to reach into overhead cabinets at home.     Time  4    Period  Weeks    Status  On-going      OT SHORT TERM GOAL #2   Title  Patient will improve left shoulder P/ROM to [facility] in order to don and doff clothing with greater independence.     Time  4    Period  Weeks    Status  On-going      OT SHORT TERM GOAL #3   Title  Patient will improve left shoulder strength to 4/5 for greater ability to lift tools when working in his yard.     Time  4    Period  Weeks    Status  Partially Met      OT SHORT TERM GOAL #4   Title  Patient will imrove left grip strength by 15 pounds and pinch strength by 2 pounds for greater ability to open jars and containers.     Time  4    Period  Weeks      OT SHORT TERM GOAL #5   Title  Patient will decrease pain in his left shoulder and hand to 4/10 or better when completing daily activities.     Time  4    Period  Weeks    Status  On-going      OT SHORT TERM GOAL #6  Title  Patient will decrease fascial and scar restrictions in left shoulder region from max to mod max for improved moblity needed to use left arm actively with daily activities.    Time  4    Period  Weeks    Status  On-going        OT Long Term Goals - 10/03/17 1624      OT LONG TERM GOAL #1   Title  Patient will use his left arm  actively wtih all daily and leisure activities without hesitation or compensatory movements.     Time  8    Period  Weeks    Status  On-going      OT LONG TERM GOAL #2   Title  Patient will improve left shoulder A/ROM to [facility] in order to reach overhead when putting groceries away and retrieving items from cabinets.     Time  8    Period  Weeks    Status  On-going      OT LONG TERM GOAL #3   Title  Patient will improve left shoulder strength to 4+/5 or better for improved ability to lift gardening tools and bags of groceries.     Time  8    Period  Weeks    Status  On-going      OT LONG TERM GOAL #4   Title  Patient will improve left hand grip strength to 60# or more and pinch strength to 14 pounds or more for improved ability to maintain grip on household objects.     Time  8    Period  Weeks    Status  On-going      OT LONG TERM GOAL #5   Title  Patient will decrease pain in his left hand and shoulder to 2/10 or better when completing functional activities.     Time  8    Period  Weeks    Status  On-going      OT LONG TERM GOAL #6   Title  Patient will decrease scar restrictions and fascial restrictions in his left scapular and forearm regions for improved mobility required to complete ADLs.    Time  8    Period  Weeks    Status  On-going            Plan - 11/03/17 1416    Clinical Impression Statement  A: Focused heavily on self stretching and shoulder and scapular strengthening. Completed ball on the wall with modification due to limited wrist extension. Pt required VC for form and technique during session.     Plan  P: Increase weight with cybex row and press. Continue to work on increasing ROM needed during functional tasks.     Consulted and Agree with Plan of Care  Patient       Patient will benefit from skilled therapeutic intervention in order to improve the following deficits and impairments:  Pain, Decreased scar mobility, Increased fascial restrictions,  Decreased range of motion, Decreased strength, Increased muscle spasms, Impaired UE functional use  Visit Diagnosis: Stiffness of left shoulder, not elsewhere classified  Acute pain of left shoulder  Other symptoms and signs involving the musculoskeletal system    Problem List Patient Active Problem List   Diagnosis Date Noted  . UTI (urinary tract infection) 04/20/2017  . UTI due to Klebsiella species 04/20/2017  . Essential hypertension 04/20/2017  . Chronic pancreatitis ([place]) 04/20/2017  . 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  Malnutrition of moderate degree 04/20/2017  . Complicated UTI (urinary tract infection)   . 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 Rectal bleeding 09/28/2016  . Hyponatremia 04/28/2015  . Abdominal pain 04/27/2015  . Acute pancreatitis 04/27/2015  . Chronic alcoholic pancreatitis ([place]) 04/27/2015  . ETOH abuse 04/27/2015  . DM type 2 (diabetes mellitus, type 2) ([place]) 04/27/2015  . Hypertension, uncontrolled 04/27/2015  . Hyperlipidemia 04/27/2015  . Hypertensive urgency 04/27/2015  . Alcohol abuse    Ailene Ravel, OTR/L,CBIS  [phone]  11/03/2017, 2:20 PM  [place] [address] [place], [place], [address] Phone: [phone]   Fax:  [phone]  Name: Colin Wagner MRN: [id number] Date of Birth: 01/07/1951

## 2017-11-08 ENCOUNTER — Other Ambulatory Visit: Payer: Self-pay

## 2017-11-08 ENCOUNTER — Encounter (HOSPITAL_COMMUNITY): Payer: Self-pay

## 2017-11-08 ENCOUNTER — Ambulatory Visit (HOSPITAL_COMMUNITY): Payer: Medicare Other

## 2017-11-08 DIAGNOSIS — R29898 Other symptoms and signs involving the musculoskeletal system: Secondary | ICD-10-CM

## 2017-11-08 DIAGNOSIS — M25512 Pain in left shoulder: Secondary | ICD-10-CM

## 2017-11-08 DIAGNOSIS — M25612 Stiffness of left shoulder, not elsewhere classified: Secondary | ICD-10-CM

## 2017-11-08 DIAGNOSIS — M25542 Pain in joints of left hand: Secondary | ICD-10-CM

## 2017-11-08 NOTE — Therapy (Signed)
[place], [place], [address] Phone: [phone]   Fax:  [phone]  Occupational Therapy Treatment  Patient Details  Name: Colin Wagner MRN: [id number] Date of Birth: [birth year] Referring Provider: Dr. Sanjuana Kava   Encounter Date: 11/08/2017  OT End of Session - 11/08/17 1216    Visit Number  13    Number of Visits  16    Date for OT Re-Evaluation  11/26/17    Authorization Type  Medicare    OT Start Time  1120    OT Stop Time  1200    OT Time Calculation (min)  40 min    Activity Tolerance  Patient tolerated treatment well    Behavior During Therapy  [facility] for tasks assessed/performed       Past Medical History:  Diagnosis Date  . Alcohol abuse   . Alcoholic pancreatitis 9826   admission  . Chronic back pain   . Chronic pancreatitis ([place])    based on ct findings 2016  . COPD (chronic obstructive pulmonary disease) ([place])   . Diabetes mellitus   . Diverticulosis   . Gastritis   . GERD (gastroesophageal reflux disease)   . Headache   . History of radiation therapy 05/12/17- 06/22/17   Left cheek and bilateral neck/ 60 Gy in 30 fractions to gross disease  . Hyperlipidemia   . Hypertension   . Peptic ulcer disease 1999   Per medical reports, no H pylori  . Renal cancer, left ([place]) 2012   he tells me that he has been released, ?and that he is free of cancer, and never had it to begin with.   . Right shoulder pain   . Testicular hypofunction     Past Surgical History:  Procedure Laterality Date  . COLONOSCOPY  2003   Dr. [name], polyps  . COLONOSCOPY  2005   Dr. [name], multiple diverticula  . COLONOSCOPY  2008   Dr. [name], diverticulosis  . COLONOSCOPY WITH PROPOFOL N/A 10/21/2016   Procedure: COLONOSCOPY WITH PROPOFOL;  Surgeon: [name], MD;  [facility];  Service: Endoscopy;  Laterality: N/A;  8:30 am  . ESOPHAGOGASTRODUODENOSCOPY     Multiple EGDs. 1999 EGD  showed gastric ulcers, no H pylori and benign biopsies performed by Dr. [name]. 2001 gastric ulcer healed. Last EGD 2005 had gastritis.  . Left partial mandibulectomy, Scapular free flap reconstruction, selective neck dissection, tracheotomy, and resection of intraoral palate cancer. Left 03/01/2017   [facility]  . LUNG BIOPSY    . PARTIAL NEPHRECTOMY Left 2012  . POLYPECTOMY  10/21/2016   Procedure: POLYPECTOMY;  Surgeon: [name], MD;  [facility];  Service: Endoscopy;;  hepatic flexure x2    There were no vitals filed for this visit.  Subjective Assessment - 11/08/17 1126    Subjective   S: It's still sore.     Currently in Pain?  Yes    Pain Score  5     Pain Location  Shoulder    Pain Orientation  Left    Pain Descriptors / Indicators  Sore    Pain Type  Chronic pain         [facility] OT Assessment - 11/08/17 1126      Assessment   Medical Diagnosis  Left Adhesive Capsulitis and Carpal Tunnel Syndrome      Precautions   Precautions  Other (comment)  Precaution Comments  history of cancer               OT Treatments/Exercises (OP) - 11/08/17 1126      Exercises   Exercises  Shoulder      Shoulder Exercises: Standing   Horizontal ABduction  Theraband;10 reps palms up    Theraband Level (Shoulder Horizontal ABduction)  Level 2 (Red)    Other Standing Exercises  Large green therapy ball used to complete shoulder strengthening and scapular stability. chest press, flexion, PNF diagonals, and circles; 12X      Shoulder Exercises: ROM/Strengthening   Cybex Press  2 plate;15 reps    Cybex Row  2 plate;15 reps    Over Head Lace  2' with 1# wrist weight    Wall Pushups  -- 12X fisted    Ball on Wall  1' flexion 1' abduction    Other ROM/Strengthening Exercises  PVC pipe slide; 15X flexion 15X abduction    Other ROM/Strengthening Exercises  Incline straight arm plank hold using mat table at approximately just above knee  height; 2x30"      Shoulder Exercises: Stretch   Internal Rotation Stretch  2 reps 30 seconds    Wall Stretch - Flexion  2 reps;30 seconds    Other Shoulder Stretches  Pectoralis stretch using wall; 2x30"             OT Education - 11/08/17 1216    Education provided  Yes    Education Details  Pt provided with red theraband to complete horizontal abduction for HEP.    Person(s) Educated  Patient    Methods  Explanation;Demonstration;Verbal cues    Comprehension  Returned demonstration;Verbalized understanding       OT Short Term Goals - 11/01/17 1208      OT SHORT TERM GOAL #1   Title  Patient will be educated on a HEP for improved mobility and strength in his left shoulder and hand in order to reach into overhead cabinets at home.     Time  4    Period  Weeks    Status  On-going      OT SHORT TERM GOAL #2   Title  Patient will improve left shoulder P/ROM to [facility] in order to don and doff clothing with greater independence.     Time  4    Period  Weeks    Status  On-going      OT SHORT TERM GOAL #3   Title  Patient will improve left shoulder strength to 4/5 for greater ability to lift tools when working in his yard.     Time  4    Period  Weeks    Status  Partially Met      OT SHORT TERM GOAL #4   Title  Patient will imrove left grip strength by 15 pounds and pinch strength by 2 pounds for greater ability to open jars and containers.     Time  4    Period  Weeks      OT SHORT TERM GOAL #5   Title  Patient will decrease pain in his left shoulder and hand to 4/10 or better when completing daily activities.     Time  4    Period  Weeks    Status  On-going      OT SHORT TERM GOAL #6   Title  Patient will decrease fascial and scar restrictions in left shoulder region from max to mod max for  improved moblity needed to use left arm actively with daily activities.    Time  4    Period  Weeks    Status  On-going        OT Long Term Goals - 10/03/17 1624      OT  LONG TERM GOAL #1   Title  Patient will use his left arm actively wtih all daily and leisure activities without hesitation or compensatory movements.     Time  8    Period  Weeks    Status  On-going      OT LONG TERM GOAL #2   Title  Patient will improve left shoulder A/ROM to [facility] in order to reach overhead when putting groceries away and retrieving items from cabinets.     Time  8    Period  Weeks    Status  On-going      OT LONG TERM GOAL #3   Title  Patient will improve left shoulder strength to 4+/5 or better for improved ability to lift gardening tools and bags of groceries.     Time  8    Period  Weeks    Status  On-going      OT LONG TERM GOAL #4   Title  Patient will improve left hand grip strength to 60# or more and pinch strength to 14 pounds or more for improved ability to maintain grip on household objects.     Time  8    Period  Weeks    Status  On-going      OT LONG TERM GOAL #5   Title  Patient will decrease pain in his left hand and shoulder to 2/10 or better when completing functional activities.     Time  8    Period  Weeks    Status  On-going      OT LONG TERM GOAL #6   Title  Patient will decrease scar restrictions and fascial restrictions in his left scapular and forearm regions for improved mobility required to complete ADLs.    Time  8    Period  Weeks    Status  On-going            Plan - 11/08/17 1217    Clinical Impression Statement  A: Continued to focus on self stretching with some extra guidance as needed. patient completed a modified plank hold to increase shoulder stability. VC for form and technique were needed during session.    Plan  P: Increase weight with cybex row and press. Add a down ward dog to high plank transition if needed at a modified height.    Consulted and Agree with Plan of Care  Patient       Patient will benefit from skilled therapeutic intervention in order to improve the following deficits and impairments:  Pain,  Decreased scar mobility, Increased fascial restrictions, Decreased range of motion, Decreased strength, Increased muscle spasms, Impaired UE functional use  Visit Diagnosis: Stiffness of left shoulder, not elsewhere classified  Acute pain of left shoulder  Other symptoms and signs involving the musculoskeletal system  Pain in joint of left hand    Problem List Patient Active Problem List   Diagnosis Date Noted  . UTI (urinary tract infection) 04/20/2017  . UTI due to Klebsiella species 04/20/2017  . Essential hypertension 04/20/2017  . Chronic pancreatitis ([place]) 04/20/2017  . 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 Malnutrition of moderate degree 04/20/2017  . Complicated UTI (  urinary tract infection)   . 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 Rectal bleeding 09/28/2016  . Hyponatremia 04/28/2015  . Abdominal pain 04/27/2015  . Acute pancreatitis 04/27/2015  . Chronic alcoholic pancreatitis ([place]) 04/27/2015  . ETOH abuse 04/27/2015  . DM type 2 (diabetes mellitus, type 2) ([place]) 04/27/2015  . Hypertension, uncontrolled 04/27/2015  . Hyperlipidemia 04/27/2015  . Hypertensive urgency 04/27/2015  . Alcohol abuse    Ailene Ravel, OTR/L,CBIS  [phone]  11/08/2017, 12:19 PM  [place] [address] [place], [place], [address] Phone: [phone]   Fax:  [phone]  Name: Colin Wagner MRN: [id number] Date of Birth: 1950-12-21

## 2017-11-10 ENCOUNTER — Ambulatory Visit (HOSPITAL_COMMUNITY): Payer: Medicare Other

## 2017-11-10 ENCOUNTER — Other Ambulatory Visit: Payer: Self-pay

## 2017-11-10 ENCOUNTER — Encounter (HOSPITAL_COMMUNITY): Payer: Self-pay

## 2017-11-10 DIAGNOSIS — M25612 Stiffness of left shoulder, not elsewhere classified: Secondary | ICD-10-CM | POA: Diagnosis not present

## 2017-11-10 DIAGNOSIS — R29898 Other symptoms and signs involving the musculoskeletal system: Secondary | ICD-10-CM

## 2017-11-10 DIAGNOSIS — M25542 Pain in joints of left hand: Secondary | ICD-10-CM

## 2017-11-10 DIAGNOSIS — M25512 Pain in left shoulder: Secondary | ICD-10-CM

## 2017-11-10 NOTE — Therapy (Signed)
[place], [place], [address] Phone: [phone]   Fax:  [phone]  Occupational Therapy Treatment  Patient Details  Name: Colin Wagner MRN: [id number] Date of Birth: 1951-05-27 Referring Provider: Dr. Sanjuana Kava   Encounter Date: 11/10/2017  OT End of Session - 11/10/17 1325    Visit Number  14    Number of Visits  16    Date for OT Re-Evaluation  11/26/17    Authorization Type  Medicare    OT Start Time  1125    OT Stop Time  1200    OT Time Calculation (min)  35 min    Activity Tolerance  Patient tolerated treatment well    Behavior During Therapy  [facility] for tasks assessed/performed       Past Medical History:  Diagnosis Date  . Alcohol abuse   . Alcoholic pancreatitis 1224   admission  . Chronic back pain   . Chronic pancreatitis ([place])    based on ct findings 2016  . COPD (chronic obstructive pulmonary disease) ([place])   . Diabetes mellitus   . Diverticulosis   . Gastritis   . GERD (gastroesophageal reflux disease)   . Headache   . History of radiation therapy 05/12/17- 06/22/17   Left cheek and bilateral neck/ 60 Gy in 30 fractions to gross disease  . Hyperlipidemia   . Hypertension   . Peptic ulcer disease 1999   Per medical reports, no H pylori  . Renal cancer, left ([place]) 2012   he tells me that he has been released, ?and that he is free of cancer, and never had it to begin with.   . Right shoulder pain   . Testicular hypofunction     Past Surgical History:  Procedure Laterality Date  . COLONOSCOPY  2003   Dr. [name], polyps  . COLONOSCOPY  2005   Dr. [name], multiple diverticula  . COLONOSCOPY  2008   Dr. [name], diverticulosis  . COLONOSCOPY WITH PROPOFOL N/A 10/21/2016   Procedure: COLONOSCOPY WITH PROPOFOL;  Surgeon: [name], MD;  [facility];  Service: Endoscopy;  Laterality: N/A;  8:30 am  . ESOPHAGOGASTRODUODENOSCOPY     Multiple EGDs. 1999 EGD  showed gastric ulcers, no H pylori and benign biopsies performed by Dr. [name]. 2001 gastric ulcer healed. Last EGD 2005 had gastritis.  . Left partial mandibulectomy, Scapular free flap reconstruction, selective neck dissection, tracheotomy, and resection of intraoral palate cancer. Left 03/01/2017   [facility]  . LUNG BIOPSY    . PARTIAL NEPHRECTOMY Left 2012  . POLYPECTOMY  10/21/2016   Procedure: POLYPECTOMY;  Surgeon: [name], MD;  [facility];  Service: Endoscopy;;  hepatic flexure x2    There were no vitals filed for this visit.  Subjective Assessment - 11/10/17 1130    Subjective   S: I was sore from the other day.    Currently in Pain?  Yes    Pain Score  5     Pain Location  Shoulder    Pain Orientation  Left    Pain Descriptors / Indicators  Sore    Pain Type  Chronic pain    Pain Radiating Towards  N/A    Pain Onset  1 to 4 weeks ago    Pain Frequency  Constant    Aggravating Factors   movement and reaching above shoulder level    Pain Relieving  Factors  heat, aspercreme    Effect of Pain on Daily Activities  min effect    Multiple Pain Sites  No         [facility] OT Assessment - 11/10/17 1135      Assessment   Medical Diagnosis  Left Adhesive Capsulitis and Carpal Tunnel Syndrome      Precautions   Precautions  Other (comment)    Precaution Comments  history of cancer               OT Treatments/Exercises (OP) - 11/10/17 1135      Exercises   Exercises  Shoulder      Shoulder Exercises: ROM/Strengthening   UBE (Upper Arm Bike)  Level 2 2' forward 2' reverse pace: 3.5-4.0    Cybex Press  2.5 plate;15 reps    Cybex Row  2.5 plate;15 reps    Wall Pushups  15 reps    X to V Arms  12X with 1#    Proximal Shoulder Strengthening, Seated  12X with 1# one restt breakl    Ball on Wall  1' flexion 1' abduction 1 rest breaks during abduction      Shoulder Exercises: Stretch   External Rotation Stretch  2  reps;30 seconds pvc pipe    Other Shoulder Stretches  Shoulder flexion stretch using PVC pipe and chair rail; 2x30"    Other Shoulder Stretches  Holding PVC pipe Snatch wideth grip; PVC pipe waist to overhead; 10X dynamic stretch               OT Short Term Goals - 11/01/17 1208      OT SHORT TERM GOAL #1   Title  Patient will be educated on a HEP for improved mobility and strength in his left shoulder and hand in order to reach into overhead cabinets at home.     Time  4    Period  Weeks    Status  On-going      OT SHORT TERM GOAL #2   Title  Patient will improve left shoulder P/ROM to [facility] in order to don and doff clothing with greater independence.     Time  4    Period  Weeks    Status  On-going      OT SHORT TERM GOAL #3   Title  Patient will improve left shoulder strength to 4/5 for greater ability to lift tools when working in his yard.     Time  4    Period  Weeks    Status  Partially Met      OT SHORT TERM GOAL #4   Title  Patient will imrove left grip strength by 15 pounds and pinch strength by 2 pounds for greater ability to open jars and containers.     Time  4    Period  Weeks      OT SHORT TERM GOAL #5   Title  Patient will decrease pain in his left shoulder and hand to 4/10 or better when completing daily activities.     Time  4    Period  Weeks    Status  On-going      OT SHORT TERM GOAL #6   Title  Patient will decrease fascial and scar restrictions in left shoulder region from max to mod max for improved moblity needed to use left arm actively with daily activities.    Time  4    Period  Weeks    Status  On-going        OT Long Term Goals - 10/03/17 1624      OT LONG TERM GOAL #1   Title  Patient will use his left arm actively wtih all daily and leisure activities without hesitation or compensatory movements.     Time  8    Period  Weeks    Status  On-going      OT LONG TERM GOAL #2   Title  Patient will improve left shoulder A/ROM to  [facility] in order to reach overhead when putting groceries away and retrieving items from cabinets.     Time  8    Period  Weeks    Status  On-going      OT LONG TERM GOAL #3   Title  Patient will improve left shoulder strength to 4+/5 or better for improved ability to lift gardening tools and bags of groceries.     Time  8    Period  Weeks    Status  On-going      OT LONG TERM GOAL #4   Title  Patient will improve left hand grip strength to 60# or more and pinch strength to 14 pounds or more for improved ability to maintain grip on household objects.     Time  8    Period  Weeks    Status  On-going      OT LONG TERM GOAL #5   Title  Patient will decrease pain in his left hand and shoulder to 2/10 or better when completing functional activities.     Time  8    Period  Weeks    Status  On-going      OT LONG TERM GOAL #6   Title  Patient will decrease scar restrictions and fascial restrictions in his left scapular and forearm regions for improved mobility required to complete ADLs.    Time  8    Period  Weeks    Status  On-going            Plan - 11/10/17 1325    Clinical Impression Statement  A: Continued to work on increasing shoulder and scapular stability during session. Pt reports increased soreness in anterior shoulder region. VC for form and technique. Unable to complete high plank to down ward dog transitions due to poor form.    Plan  P: Continue to work on pt's strengthening within patient's ROM        Patient will benefit from skilled therapeutic intervention in order to improve the following deficits and impairments:  Pain, Decreased scar mobility, Increased fascial restrictions, Decreased range of motion, Decreased strength, Increased muscle spasms, Impaired UE functional use  Visit Diagnosis: Stiffness of left shoulder, not elsewhere classified  Acute pain of left shoulder  Other symptoms and signs involving the musculoskeletal system  Pain in joint of left  hand    Problem List Patient Active Problem List   Diagnosis Date Noted  . UTI (urinary tract infection) 04/20/2017  . UTI due to Klebsiella species 04/20/2017  . Essential hypertension 04/20/2017  . Chronic pancreatitis ([place]) 04/20/2017  . 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 Malnutrition of moderate degree 04/20/2017  . Complicated UTI (urinary tract infection)   . 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 Rectal bleeding 09/28/2016  . Hyponatremia 04/28/2015  . Abdominal pain 04/27/2015  . Acute pancreatitis 04/27/2015  . Chronic alcoholic pancreatitis ([place]) 04/27/2015  . ETOH abuse 04/27/2015  .  DM type 2 (diabetes mellitus, type 2) ([place]) 04/27/2015  . Hypertension, uncontrolled 04/27/2015  . Hyperlipidemia 04/27/2015  . Hypertensive urgency 04/27/2015  . Alcohol abuse    Ailene Ravel, OTR/L,CBIS  [phone]  11/10/2017, 2:21 PM  [place] [address] [place], [place], [address] Phone: [phone]   Fax:  [phone]  Name: Colin Wagner MRN: [id number] Date of Birth: 1951/03/25

## 2017-11-15 ENCOUNTER — Ambulatory Visit (HOSPITAL_COMMUNITY): Payer: Medicare Other

## 2017-11-15 ENCOUNTER — Other Ambulatory Visit: Payer: Self-pay

## 2017-11-15 ENCOUNTER — Encounter (HOSPITAL_COMMUNITY): Payer: Self-pay

## 2017-11-15 DIAGNOSIS — M25512 Pain in left shoulder: Secondary | ICD-10-CM

## 2017-11-15 DIAGNOSIS — M25542 Pain in joints of left hand: Secondary | ICD-10-CM

## 2017-11-15 DIAGNOSIS — R29898 Other symptoms and signs involving the musculoskeletal system: Secondary | ICD-10-CM

## 2017-11-15 DIAGNOSIS — M25612 Stiffness of left shoulder, not elsewhere classified: Secondary | ICD-10-CM | POA: Diagnosis not present

## 2017-11-15 NOTE — Therapy (Signed)
[place], [place], [address] Phone: [phone]   Fax:  [phone]  Occupational Therapy Treatment  Patient Details  Name: Colin Wagner MRN: [id number] Date of Birth: [birth year] Referring Provider: Dr. Sanjuana Kava   Encounter Date: 11/15/2017  OT End of Session - 11/15/17 1243    Visit Number  15    Number of Visits  16    Date for OT Re-Evaluation  11/26/17    Authorization Type  Medicare    OT Start Time  1116    OT Stop Time  1205    OT Time Calculation (min)  49 min    Activity Tolerance  Patient tolerated treatment well    Behavior During Therapy  [facility] for tasks assessed/performed       Past Medical History:  Diagnosis Date  . Alcohol abuse   . Alcoholic pancreatitis 2637   admission  . Chronic back pain   . Chronic pancreatitis ([place])    based on ct findings 2016  . COPD (chronic obstructive pulmonary disease) ([place])   . Diabetes mellitus   . Diverticulosis   . Gastritis   . GERD (gastroesophageal reflux disease)   . Headache   . History of radiation therapy 05/12/17- 06/22/17   Left cheek and bilateral neck/ 60 Gy in 30 fractions to gross disease  . Hyperlipidemia   . Hypertension   . Peptic ulcer disease 1999   Per medical reports, no H pylori  . Renal cancer, left ([place]) 2012   he tells me that he has been released, ?and that he is free of cancer, and never had it to begin with.   . Right shoulder pain   . Testicular hypofunction     Past Surgical History:  Procedure Laterality Date  . COLONOSCOPY  2003   Dr. [name], polyps  . COLONOSCOPY  2005   Dr. [name], multiple diverticula  . COLONOSCOPY  2008   Dr. [name], diverticulosis  . COLONOSCOPY WITH PROPOFOL N/A 10/21/2016   Procedure: COLONOSCOPY WITH PROPOFOL;  Surgeon: [name], MD;  [facility];  Service: Endoscopy;  Laterality: N/A;  8:30 am  . ESOPHAGOGASTRODUODENOSCOPY     Multiple EGDs. 1999 EGD  showed gastric ulcers, no H pylori and benign biopsies performed by Dr. [name]. 2001 gastric ulcer healed. Last EGD 2005 had gastritis.  . Left partial mandibulectomy, Scapular free flap reconstruction, selective neck dissection, tracheotomy, and resection of intraoral palate cancer. Left 03/01/2017   [facility]  . LUNG BIOPSY    . PARTIAL NEPHRECTOMY Left 2012  . POLYPECTOMY  10/21/2016   Procedure: POLYPECTOMY;  Surgeon: [name], MD;  [facility];  Service: Endoscopy;;  hepatic flexure x2    There were no vitals filed for this visit.  Subjective Assessment - 11/15/17 1122    Subjective   S: This morning didn't start out very good.     Currently in Pain?  Yes    Pain Score  5     Pain Location  Shoulder    Pain Orientation  Left    Pain Descriptors / Indicators  Sore    Pain Type  Chronic pain         [facility] OT Assessment - 11/15/17 1122      Assessment   Medical Diagnosis  Left Adhesive Capsulitis and Carpal Tunnel Syndrome      Precautions   Precautions  Other (  comment)    Precaution Comments  history of cancer               OT Treatments/Exercises (OP) - 11/15/17 1123      Exercises   Exercises  Shoulder      Shoulder Exercises: Supine   Protraction  Strengthening;12 reps    Protraction Weight (lbs)  3    Horizontal ABduction  Strengthening;12 reps;Limitations    Horizontal ABduction Weight (lbs)  3    Horizontal ABduction Limitations  achieved 25% range of motion    External Rotation  Strengthening;12 reps    External Rotation Limitations  3    Internal Rotation  Strengthening;12 reps    Internal Rotation Limitations  3    Flexion  Strengthening;12 reps    Shoulder Flexion Weight (lbs)  3    ABduction  Strengthening;10 reps    Shoulder ABduction Weight (lbs)  2      Shoulder Exercises: Sidelying   External Rotation  Strengthening;12 reps    External Rotation Weight (lbs)  3    Internal Rotation   Strengthening;12 reps    Internal Rotation Weight (lbs)  3    Flexion  Strengthening;12 reps    Flexion Weight (lbs)  3    ABduction  Strengthening;12 reps    ABduction Weight (lbs)  3    Other Sidelying Exercises  Protraction; 12X; 3#    Other Sidelying Exercises  Horizontal abduction; 12X; 3#      Shoulder Exercises: Standing   Protraction  Theraband;12 reps    Theraband Level (Shoulder Protraction)  Level 2 (Red)    Horizontal ABduction  Theraband;12 reps    Theraband Level (Shoulder Horizontal ABduction)  Level 2 (Red)    Extension  Theraband;12 reps    Theraband Level (Shoulder Extension)  Level 2 (Red)    Retraction  Theraband;12 reps    Theraband Level (Shoulder Retraction)  Level 2 (Red)    Other Standing Exercises  Incline punch with red theraband; 10X      Shoulder Exercises: ROM/Strengthening   UBE (Upper Arm Bike)  Level 3 2' forward 2' reverse pace: 3.5-4.0    Over Head Lace  2' with 1# wrist weight    Ball on Wall  1' flexion 1' abduction               OT Short Term Goals - 11/01/17 1208      OT SHORT TERM GOAL #1   Title  Patient will be educated on a HEP for improved mobility and strength in his left shoulder and hand in order to reach into overhead cabinets at home.     Time  4    Period  Weeks    Status  On-going      OT SHORT TERM GOAL #2   Title  Patient will improve left shoulder P/ROM to [facility] in order to don and doff clothing with greater independence.     Time  4    Period  Weeks    Status  On-going      OT SHORT TERM GOAL #3   Title  Patient will improve left shoulder strength to 4/5 for greater ability to lift tools when working in his yard.     Time  4    Period  Weeks    Status  Partially Met      OT SHORT TERM GOAL #4   Title  Patient will imrove left grip strength by 15 pounds and pinch strength  by 2 pounds for greater ability to open jars and containers.     Time  4    Period  Weeks      OT SHORT TERM GOAL #5   Title  Patient  will decrease pain in his left shoulder and hand to 4/10 or better when completing daily activities.     Time  4    Period  Weeks    Status  On-going      OT SHORT TERM GOAL #6   Title  Patient will decrease fascial and scar restrictions in left shoulder region from max to mod max for improved moblity needed to use left arm actively with daily activities.    Time  4    Period  Weeks    Status  On-going        OT Long Term Goals - 10/03/17 1624      OT LONG TERM GOAL #1   Title  Patient will use his left arm actively wtih all daily and leisure activities without hesitation or compensatory movements.     Time  8    Period  Weeks    Status  On-going      OT LONG TERM GOAL #2   Title  Patient will improve left shoulder A/ROM to [facility] in order to reach overhead when putting groceries away and retrieving items from cabinets.     Time  8    Period  Weeks    Status  On-going      OT LONG TERM GOAL #3   Title  Patient will improve left shoulder strength to 4+/5 or better for improved ability to lift gardening tools and bags of groceries.     Time  8    Period  Weeks    Status  On-going      OT LONG TERM GOAL #4   Title  Patient will improve left hand grip strength to 60# or more and pinch strength to 14 pounds or more for improved ability to maintain grip on household objects.     Time  8    Period  Weeks    Status  On-going      OT LONG TERM GOAL #5   Title  Patient will decrease pain in his left hand and shoulder to 2/10 or better when completing functional activities.     Time  8    Period  Weeks    Status  On-going      OT LONG TERM GOAL #6   Title  Patient will decrease scar restrictions and fascial restrictions in his left scapular and forearm regions for improved mobility required to complete ADLs.    Time  8    Period  Weeks    Status  On-going            Plan - 11/15/17 1243    Clinical Impression Statement  A: focused on strengthening within patient's  available ROM. VC were needed for form and technique. Pt presented with pain complaints in left anterior shoulder region which was aggrevated by theraband shoulder incline punch and supine horizontal abduction with weight.     Plan  P: reassess and discharge. provide Y and Forensic psychologist. Update HEP.     Consulted and Agree with Plan of Care  Patient       Patient will benefit from skilled therapeutic intervention in order to improve the following deficits and impairments:  Pain, Decreased scar mobility, Increased fascial restrictions, Decreased  range of motion, Decreased strength, Increased muscle spasms, Impaired UE functional use  Visit Diagnosis: Stiffness of left shoulder, not elsewhere classified  Acute pain of left shoulder  Other symptoms and signs involving the musculoskeletal system  Pain in joint of left hand    Problem List Patient Active Problem List   Diagnosis Date Noted  . UTI (urinary tract infection) 04/20/2017  . UTI due to Klebsiella species 04/20/2017  . Essential hypertension 04/20/2017  . Chronic pancreatitis ([place]) 04/20/2017  . 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 Malnutrition of moderate degree 04/20/2017  . Complicated UTI (urinary tract infection)   . 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 Rectal bleeding 09/28/2016  . Hyponatremia 04/28/2015  . Abdominal pain 04/27/2015  . Acute pancreatitis 04/27/2015  . Chronic alcoholic pancreatitis ([place]) 04/27/2015  . ETOH abuse 04/27/2015  . DM type 2 (diabetes mellitus, type 2) ([place]) 04/27/2015  . Hypertension, uncontrolled 04/27/2015  . Hyperlipidemia 04/27/2015  . Hypertensive urgency 04/27/2015  . Alcohol abuse    Ailene Ravel, OTR/L,CBIS  [phone]  11/15/2017, 12:46 PM  [place] [address] [place], [place], [address] Phone: [phone]   Fax:  [phone]  Name: Colin Wagner MRN: [id number] Date  of Birth: November 08, 1950

## 2017-11-17 ENCOUNTER — Ambulatory Visit (HOSPITAL_COMMUNITY): Payer: Medicare Other

## 2017-11-17 DIAGNOSIS — M25512 Pain in left shoulder: Secondary | ICD-10-CM

## 2017-11-17 DIAGNOSIS — R29898 Other symptoms and signs involving the musculoskeletal system: Secondary | ICD-10-CM

## 2017-11-17 DIAGNOSIS — M25542 Pain in joints of left hand: Secondary | ICD-10-CM

## 2017-11-17 DIAGNOSIS — M25612 Stiffness of left shoulder, not elsewhere classified: Secondary | ICD-10-CM | POA: Diagnosis not present

## 2017-11-17 NOTE — Patient Instructions (Signed)
(  Home) Extension: Isometric / Bilateral Arm Retraction - Sitting   Facing anchor, hold hands and elbow at shoulder height, with elbow bent.  Pull arms back to squeeze shoulder blades together. Repeat 10-15 times. 1-3 times/day.   (Clinic) Extension / Flexion (Assist)   Face anchor, pull arms back, keeping elbow straight, and squeze shoulder blades together. Repeat 10-15 times. 1-3 times/day.   Copyright  VHI. All rights reserved.   (Home) Retraction: Row - Bilateral (Anchor)   Facing anchor, arms reaching forward, pull hands toward stomach, keeping elbows bent and at your sides and pinching shoulder blades together. Repeat 10-15 times. 1-3 times/day.   Copyright  VHI. All rights reserved.   ELASTIC BAND - HORIZONTAL ABDUCTION  Start by holding an elastic band or tubing with your arm out-stretched in front of you and across your body towards the opposite side.  Next, pull the elastic band or cord horizontally and outward as shown.   Your elbow should be straight or slightly bent the entire time.       Horizontal Abduction with Band - Shoulder T  In a half kneeling position, start with the arms fully extended with the hands at shoulder height.  Keeping the arms straight, pull the hands apart like a "T" and squeeze the shoulder blades together. Slowly return to the starting position.      Scapular Protraction  Stand with an elastic band wrapped around your back, stretching straight forward in both hands. From here, push your scapula forward. This results in a small punching motion through the arms.

## 2017-11-17 NOTE — Therapy (Signed)
[place], [place], [address] Phone: [phone]   Fax:  [phone]  Occupational Therapy Treatment  Patient Details  Name: Colin Wagner MRN: [id number] Date of Birth: August 15, 1951 Referring Provider: Dr. Sanjuana Kava   Encounter Date: 11/17/2017  OT End of Session - 11/17/17 1412    Visit Number  16    Number of Visits  16    Date for OT Re-Evaluation  11/26/17    Authorization Type  Medicare    OT Start Time  1115 reassess and discharge    OT Stop Time  1200    OT Time Calculation (min)  45 min    Activity Tolerance  Patient tolerated treatment well    Behavior During Therapy  [facility] for tasks assessed/performed       Past Medical History:  Diagnosis Date  . Alcohol abuse   . Alcoholic pancreatitis 0300   admission  . Chronic back pain   . Chronic pancreatitis ([place])    based on ct findings 2016  . COPD (chronic obstructive pulmonary disease) ([place])   . Diabetes mellitus   . Diverticulosis   . Gastritis   . GERD (gastroesophageal reflux disease)   . Headache   . History of radiation therapy 05/12/17- 06/22/17   Left cheek and bilateral neck/ 60 Gy in 30 fractions to gross disease  . Hyperlipidemia   . Hypertension   . Peptic ulcer disease 1999   Per medical reports, no H pylori  . Renal cancer, left ([place]) 2012   he tells me that he has been released, ?and that he is free of cancer, and never had it to begin with.   . Right shoulder pain   . Testicular hypofunction     Past Surgical History:  Procedure Laterality Date  . COLONOSCOPY  2003   Dr. [name], polyps  . COLONOSCOPY  2005   Dr. [name], multiple diverticula  . COLONOSCOPY  2008   Dr. [name], diverticulosis  . COLONOSCOPY WITH PROPOFOL N/A 10/21/2016   Procedure: COLONOSCOPY WITH PROPOFOL;  Surgeon: [name], MD;  [facility];  Service: Endoscopy;  Laterality: N/A;  8:30 am  . ESOPHAGOGASTRODUODENOSCOPY      Multiple EGDs. 1999 EGD showed gastric ulcers, no H pylori and benign biopsies performed by Dr. [name]. 2001 gastric ulcer healed. Last EGD 2005 had gastritis.  . Left partial mandibulectomy, Scapular free flap reconstruction, selective neck dissection, tracheotomy, and resection of intraoral palate cancer. Left 03/01/2017   [facility]  . LUNG BIOPSY    . PARTIAL NEPHRECTOMY Left 2012  . POLYPECTOMY  10/21/2016   Procedure: POLYPECTOMY;  Surgeon: [name], MD;  [facility];  Service: Endoscopy;;  hepatic flexure x2    There were no vitals filed for this visit.  Subjective Assessment - 11/17/17 1357    Subjective   S: I've been working on it at home a lot. It gets sore after I do my exercises.     Currently in Pain?  No/denies         [facility] OT Assessment - 11/17/17 1119      Assessment   Medical Diagnosis  Left Adhesive Capsulitis and Carpal Tunnel Syndrome    Onset Date/Surgical Date  03/01/17      Precautions   Precautions  Other (comment)    Precaution Comments  history of cancer      Prior Function  Level of Independence  Independent      Observation/Other Assessments   Focus on Therapeutic Outcomes (FOTO)   54/100      AROM   Overall AROM Comments  assessed in seated, external and internal rotation with shoulder adducted.  Wrist A/ROM is [facility]    AROM Assessment Site  Shoulder    Right/Left Shoulder  Left    Left Shoulder Flexion  107 Degrees previous: 100    Left Shoulder ABduction  95 Degrees previous: 100    Left Shoulder Internal Rotation  90 Degrees previous: same    Left Shoulder External Rotation  45 Degrees previous: 30      Strength   Overall Strength Comments  assessed in seated. IR/er adducted    Strength Assessment Site  Shoulder    Right/Left Shoulder  Left    Left Shoulder Flexion  4+/5 previousL 4/5    Left Shoulder ABduction  3-/5 previous: 4-/5    Left Shoulder Internal Rotation  5/5 previous: 4+/5     Left Shoulder External Rotation  4+/5 previous: 4-/5               OT Treatments/Exercises (OP) - 11/17/17 1358      Exercises   Exercises  Shoulder      Shoulder Exercises: Standing   Protraction  Theraband;5 reps    Theraband Level (Shoulder Protraction)  Level 3 (Green)    Horizontal ABduction  Theraband;10 reps    Theraband Level (Shoulder Horizontal ABduction)  Level 3 (Green)    Extension  [employer] reps    Theraband Level (Shoulder Extension)  Level 3 (Green)    Row  [employer] reps    Theraband Level (Shoulder Row)  Level 3 (Green)    Retraction  Theraband;10 reps    Theraband Level (Shoulder Retraction)  Level 3 Nyoka Cowden)             OT Education - 11/17/17 1412    Education provided  Yes    Education Details  reviewed exercises. HEP updated with green band strengthening.     Person(s) Educated  Patient    Methods  Explanation;Demonstration;Handout    Comprehension  Returned demonstration;Verbalized understanding       OT Short Term Goals - 11/17/17 1412      OT SHORT TERM GOAL #1   Title  Patient will be educated on a HEP for improved mobility and strength in his left shoulder and hand in order to reach into overhead cabinets at home.     Time  4    Period  Weeks    Status  Achieved      OT SHORT TERM GOAL #2   Title  Patient will improve left shoulder P/ROM to [facility] in order to don and doff clothing with greater independence.     Time  4    Period  Weeks    Status  Achieved      OT SHORT TERM GOAL #3   Title  Patient will improve left shoulder strength to 4/5 for greater ability to lift tools when working in his yard.     Time  4    Period  Weeks    Status  Partially Met      OT SHORT TERM GOAL #4   Title  Patient will imrove left grip strength by 15 pounds and pinch strength by 2 pounds for greater ability to open jars and containers.     Time  4    Period  Weeks      OT SHORT TERM GOAL #5   Title  Patient will decrease pain in his  left shoulder and hand to 4/10 or better when completing daily activities.     Time  4    Period  Weeks    Status  Achieved      OT SHORT TERM GOAL #6   Title  Patient will decrease fascial and scar restrictions in left shoulder region from max to mod max for improved moblity needed to use left arm actively with daily activities.    Time  4    Period  Weeks    Status  Achieved        OT Long Term Goals - 11/17/17 1413      OT LONG TERM GOAL #1   Title  Patient will use his left arm actively wtih all daily and leisure activities without hesitation or compensatory movements.     Time  8    Period  Weeks    Status  Not Met      OT LONG TERM GOAL #2   Title  Patient will improve left shoulder A/ROM to [facility] in order to reach overhead when putting groceries away and retrieving items from cabinets.     Time  8    Period  Weeks    Status  Not Met      OT LONG TERM GOAL #3   Title  Patient will improve left shoulder strength to 4+/5 or better for improved ability to lift gardening tools and bags of groceries.     Time  8    Period  Weeks    Status  Not Met      OT LONG TERM GOAL #4   Title  Patient will improve left hand grip strength to 60# or more and pinch strength to 14 pounds or more for improved ability to maintain grip on household objects.     Time  8    Period  Weeks    Status  Achieved      OT LONG TERM GOAL #5   Title  Patient will decrease pain in his left hand and shoulder to 2/10 or better when completing functional activities.     Time  8    Period  Weeks    Status  Not Met      OT LONG TERM GOAL #6   Title  Patient will decrease scar restrictions and fascial restrictions in his left scapular and forearm regions for improved mobility required to complete ADLs.    Time  8    Period  Weeks    Status  Achieved            Plan - 11/17/17 1416    Clinical Impression Statement  A: Reassessment completed this date. Patient overall has met 5/6 STGs and 2/6  LTGs. His A/ROM is functional although he is only able to complete some lightweight tasks above shoulder level. Abduction is very difficult when standing. His strength has increased for all except abduction. HEP was updated. Patient is requesting discharge to continue working on LUE at home.     Plan  P: D/C from therapy with HEP.     Consulted and Agree with Plan of Care  Patient       Patient will benefit from skilled therapeutic intervention in order to improve the following deficits and impairments:  Pain, Decreased scar mobility, Increased fascial restrictions, Decreased range of motion, Decreased strength,  Increased muscle spasms, Impaired UE functional use  Visit Diagnosis: Stiffness of left shoulder, not elsewhere classified  Acute pain of left shoulder  Other symptoms and signs involving the musculoskeletal system  Pain in joint of left hand    Problem List Patient Active Problem List   Diagnosis Date Noted  . UTI (urinary tract infection) 04/20/2017  . UTI due to Klebsiella species 04/20/2017  . Essential hypertension 04/20/2017  . Chronic pancreatitis ([place]) 04/20/2017  . 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 Malnutrition of moderate degree 04/20/2017  . Complicated UTI (urinary tract infection)   . 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 Rectal bleeding 09/28/2016  . Hyponatremia 04/28/2015  . Abdominal pain 04/27/2015  . Acute pancreatitis 04/27/2015  . Chronic alcoholic pancreatitis ([place]) 04/27/2015  . ETOH abuse 04/27/2015  . DM type 2 (diabetes mellitus, type 2) ([place]) 04/27/2015  . Hypertension, uncontrolled 04/27/2015  . Hyperlipidemia 04/27/2015  . Hypertensive urgency 04/27/2015  . Alcohol abuse    OCCUPATIONAL THERAPY DISCHARGE SUMMARY  Visits from Start of Care: 16  Current functional level related to goals / functional outcomes: See above   Remaining deficits: See above   Education / Equipment: See  above Plan: Patient agrees to discharge.  Patient goals were partially met. Patient is being discharged due to the patient's request.  ?????        Ailene Ravel, OTR/L,CBIS  [phone]  11/17/2017, 2:21 PM  [place], [place], [address] Phone: [phone]   Fax:  [phone]  Name: Colin Wagner MRN: [id number] Date of Birth: 04-05-1951

## 2017-12-28 ENCOUNTER — Ambulatory Visit (INDEPENDENT_AMBULATORY_CARE_PROVIDER_SITE_OTHER): Payer: Medicare Other | Admitting: Orthopedic Surgery

## 2017-12-28 VITALS — BP 119/74 | HR 97 | Ht 71.0 in | Wt 184.0 lb

## 2017-12-28 DIAGNOSIS — M7502 Adhesive capsulitis of left shoulder: Secondary | ICD-10-CM

## 2017-12-28 NOTE — Progress Notes (Signed)
Progress Note   Patient ID: Colin Wagner, male   DOB: 1950-10-17, 67 y.o.   MRN: [id number]  Chief Complaint  Patient presents with  . Follow-up    Recheck on left shoulder.     Medical decision-making Encounter Diagnosis  Name Primary?  . Adhesive capsulitis of left shoulder Yes      No orders of the defined types were placed in this encounter.    PLAN:  Continue home exercises follow-up as needed    Chief Complaint  Patient presents with  . Follow-up    Recheck on left shoulder.    67 year old male with adhesive capsulitis left shoulder finished his physical therapy is happy with the shoulder function is using a pulley at home    ROS No outpatient medications have been marked as taking for the 12/28/17 encounter (Office Visit) with Carole Civil, MD.    No Known Allergies   BP 119/74   Pulse 97   Ht 5\' 11"  (1.803 m)   Wt 184 lb (83.5 kg)   BMI 25.66 kg/m   Physical Exam  Constitutional: He is oriented to person, place, and time. He appears well-developed and well-nourished.  Vital signs have been reviewed and are stable. Gen. appearance the patient is well-developed and well-nourished with normal grooming and hygiene.   Musculoskeletal:       Arms: Neurological: He is alert and oriented to person, place, and time.  Skin: Skin is warm and dry. No erythema.  Psychiatric: He has a normal mood and affect.  Vitals reviewed.      Arther Abbott, MD 12/28/2017 9:07 AM

## 2018-01-03 ENCOUNTER — Encounter: Payer: Self-pay | Admitting: Internal Medicine

## 2018-02-06 ENCOUNTER — Other Ambulatory Visit: Payer: Self-pay | Admitting: Radiation Oncology

## 2018-02-06 DIAGNOSIS — C060 Malignant neoplasm of cheek mucosa: Secondary | ICD-10-CM

## 2018-02-08 ENCOUNTER — Ambulatory Visit (INDEPENDENT_AMBULATORY_CARE_PROVIDER_SITE_OTHER): Payer: Medicare Other | Admitting: Gastroenterology

## 2018-02-08 ENCOUNTER — Encounter: Payer: Self-pay | Admitting: Gastroenterology

## 2018-02-08 DIAGNOSIS — Z431 Encounter for attention to gastrostomy: Secondary | ICD-10-CM

## 2018-02-08 NOTE — Progress Notes (Signed)
Primary Care Physician:  [name], MD  Primary Gastroenterologist:  Garfield Cornea, MD   Chief Complaint  Patient presents with  . g tube discomfort    HPI:  Colin Wagner is a 67 y.o. male here for further management of G tube.  We last saw him in February 2018 for colonoscopy.  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  He completed radiation end of October.  He continues to use his G-tube regularly.  He has had issues with oral competence status post resection.  He reports that they are contemplating additional surgery (suspension of left lower lip) to try to assist with his ability to manipulate liquids and solids told that this would not be 100%.  He has seen speech therapy.  He continues to use his G-tube regularly.  Takes 6 glucerna per day.  Last fall he had to have it changed out a couple times in the emergency department because it became clogged.  He believes it was related to his Lomax.  He is by mouth as well as many other medications and therefore he has had no issues with his G-tube becoming clogged. Last G-tube placed in ED on 07/13/2017 with 20gauge G tube.   Presents today specifically because of G-tube pain.  Again it is functioning without any difficulty.  He states he has some mild discomfort at the G-tube site and he was concerned that there may be something wrong with it.  It does not hurt all the time.  Does not hurt when he uses his tube.  Mostly with movement.   Current Outpatient Medications  Medication Sig Dispense Refill  . Acetaminophen (TYLENOL 8 HOUR PO) 2 tablets by Feeding Tube route 2 (two) times daily as needed.     Marland Kitchen albuterol (PROVENTIL HFA;VENTOLIN HFA) 108 (90 Base) MCG/ACT inhaler Inhale 1-2 puffs into the lungs every 6 (six) hours as needed for wheezing or shortness of breath. 1 Inhaler 0  . atorvastatin (LIPITOR) 40 MG  tablet Take 40 mg by mouth daily at 6 PM.     . B-D UF III MINI PEN NEEDLES 31G X 5 MM MISC   3  . B-D ULTRAFINE III SHORT PEN 31G X 8 MM MISC     . emollient (BIAFINE) cream Apply topically as needed. 454 g 0  . HYDROcodone-acetaminophen (NORCO) 7.5-325 MG tablet Take 1 tablet by mouth every 6 (six) hours as needed (pain). (Patient taking differently: 1 tablet every 6 (six) hours as needed (pain). ) 60 tablet 0  . ibuprofen (ADVIL,MOTRIN) 100 MG/5ML suspension Place 20 mLs (400 mg total) into feeding tube every 6 (six) hours as needed. Take with food. 473 mL 4  . lidocaine (XYLOCAINE) 2 % solution MIX 1 PART SOLUTION & 1 PART WATER SWISH & SWALLOW 10MLS OF MIXTURE 30 MINS BEFORE MEALS UP TO 4/DAY. OK to spit out if throat isn't sore. 100 mL 5  . lisinopril (PRINIVIL,ZESTRIL) 5 MG tablet Take 5 mg by mouth daily.     Marland Kitchen loratadine (CLARITIN) 10 MG tablet Take 10 mg by mouth daily.    Marland Kitchen LORazepam (ATIVAN) 1 MG tablet Take 1 mg by mouth 4 (four) times daily as needed for anxiety. For anxiety  5  . metFORMIN (GLUCOPHAGE-XR) 500 MG 24 hr tablet Take 1-2 tablets by mouth 3 (three) times daily. Takes 2 tablets in the morning, 1 tablet at lunch and  1 tablet at dinner  3  . pantoprazole (PROTONIX) 20 MG tablet Take 20 mg by mouth daily.     Marland Kitchen senna (SENOKOT) 8.6 MG TABS tablet Take 1 tablet (8.6 mg total) by mouth at bedtime as needed for mild constipation. May be needed as pain medication can constipate. 120 each 1  . tamsulosin (FLOMAX) 0.4 MG CAPS capsule TAKE ONE CAPSULE BY MOUTH EVERY DAY FOR PROSTATE  3  . testosterone cypionate (DEPOTESTOSTERONE CYPIONATE) 200 MG/ML injection Inject 100 mg into the muscle every 14 (fourteen) days.     . traZODone (DESYREL) 50 MG tablet Take 50 mg by mouth at bedtime.    . Water For Irrigation, Sterile (FREE WATER) SOLN Place 100 mLs into feeding tube every 8 (eight) hours. 1000 mL 0   No current facility-administered medications for this visit.     Allergies as of  02/08/2018  . (No Known Allergies)    Past Medical History:  Diagnosis Date  . Alcohol abuse   . Alcoholic pancreatitis 2876   admission  . Chronic back pain   . Chronic pancreatitis ([place])    based on ct findings 2016  . COPD (chronic obstructive pulmonary disease) ([place])   . Diabetes mellitus   . Diverticulosis   . Gastritis   . GERD (gastroesophageal reflux disease)   . Headache   . History of radiation therapy 05/12/17- 06/22/17   Left cheek and bilateral neck/ 60 Gy in 30 fractions to gross disease  . Hyperlipidemia   . Hypertension   . Peptic ulcer disease 1999   Per medical reports, no H pylori  . Renal cancer, left ([place]) 2012   he tells me that he has been released, ?and that he is free of cancer, and never had it to begin with.   . Right shoulder pain   . Testicular hypofunction     Past Surgical History:  Procedure Laterality Date  . COLONOSCOPY  2003   Dr. [name], polyps  . COLONOSCOPY  2005   Dr. [name], multiple diverticula  . COLONOSCOPY  2008   Dr. [name], diverticulosis  . COLONOSCOPY WITH PROPOFOL N/A 10/21/2016   Dr. [name]: Diverticulosis, two 5-7 mm polyps removed. path-tubular adenomas.  Next colonoscopy in 5 years.  . ESOPHAGOGASTRODUODENOSCOPY     Multiple EGDs. 1999 EGD showed gastric ulcers, no H pylori and benign biopsies performed by Dr. [name]. 2001 gastric ulcer healed. Last EGD 2005 had gastritis.  . Left partial mandibulectomy, Scapular free flap reconstruction, selective neck dissection, tracheotomy, and resection of intraoral palate cancer. Left 03/01/2017   [facility]  . LUNG BIOPSY    . PARTIAL NEPHRECTOMY Left 2012  . POLYPECTOMY  10/21/2016   Procedure: POLYPECTOMY;  Surgeon: [name], MD;  Location: [facility];  Service: Endoscopy;;  hepatic flexure x2    Family History  Problem Relation Age of Onset  . Hypertension Mother   . Colon cancer Neg Hx     Social History   Socioeconomic  History  . Marital status: Divorced    Spouse name: Not on file  . Number of children: 0  . Years of education: Not on file  . Highest education level: Not on file  Occupational History  . Not on file  Social Needs  . Financial resource strain: Not on file  . Food insecurity:    Worry: Not on file    Inability: Not on file  . Transportation needs:  Medical: Not on file    Non-medical: Not on file  Tobacco Use  . Smoking status: Former Smoker    Packs/day: 0.50    Years: 35.00    Pack years: 17.50  . Smokeless tobacco: Never Used  Substance and Sexual Activity  . Alcohol use: No    Comment: quit 2000 but relapse in 2016. no etoh since hospitalized 2016.   . Drug use: No  . Sexual activity: Not on file  Lifestyle  . Physical activity:    Days per week: Not on file    Minutes per session: Not on file  . Stress: Not on file  Relationships  . Social connections:    Talks on phone: Not on file    Gets together: Not on file    Attends religious service: Not on file    Active member of club or organization: Not on file    Attends meetings of clubs or organizations: Not on file    Relationship status: Not on file  . Intimate partner violence:    Fear of current or ex partner: Not on file    Emotionally abused: Not on file    Physically abused: Not on file    Forced sexual activity: Not on file  Other Topics Concern  . 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   Patient with a history of smoking half pack a day for 35+ years. Patient currently only smoking a few cigarettes per day.   The patient has history of alcohol abuse. Patient quit in 2000 and then relapsed in 2016. Patient has not had any alcohol since 2016.      ROS:  General: Negative for anorexia, weight loss, fever, chills, fatigue, weakness. He has regained all of his lost weight around time of surgery/treatment.  Eyes: Negative for vision  changes.  ENT: Negative for hoarseness,  nasal congestion. See hpi CV: Negative for chest pain, angina, palpitations, dyspnea on exertion, peripheral edema.  Respiratory: Negative for dyspnea at rest, dyspnea on exertion, cough, sputum, wheezing.  GI: See history of present illness. GU:  Negative for dysuria, hematuria, urinary incontinence, urinary frequency, nocturnal urination.  MS: Negative for joint pain, low back pain.  Derm: Negative for rash or itching.  Neuro: Negative for weakness, abnormal sensation, seizure, frequent headaches, memory loss, confusion.  Psych: Negative for anxiety, depression, suicidal ideation, hallucinations.  Endo: Negative for unusual weight change.  Heme: Negative for bruising or bleeding. Allergy: Negative for rash or hives.    Physical Examination:  BP 121/71   Pulse 99   Temp (!) 96.3 F (35.7 C) (Oral)   Ht 5\' 11"  (1.803 m)   Wt 183 lb 6.4 oz (83.2 kg)   BMI 25.58 kg/m    General: Well-nourished, well-developed in no acute distress. Left lower lip incompetent.  Head: Normocephalic, atraumatic.   Eyes: Conjunctiva pink, no icterus. Mouth: Oropharyngeal mucosa moist and pink  Neck: Supple  Lungs: Clear to auscultation bilaterally.  Heart: Regular rate and rhythm, no murmurs rubs or gallops.  Abdomen: Bowel sounds are normal,   nondistended, no hepatosplenomegaly or masses, no abdominal bruits or    hernia , no rebound or guarding.  G-tube in left abdomen, skin around tube dry without erythema. At the left lower edge of the fistula tract there is very minimal erythema and mild granulation tissue?? Movement of the tube produced minimal pain. Palpation of erythematous area did  not reproduce pain. Overall the site looked healthy. Rectal: not performed Extremities: No lower extremity edema. No clubbing or deformities.  Neuro: Alert and oriented x 4 , grossly normal neurologically.  Skin: Warm and dry, no rash or jaundice.   Psych: Alert and  cooperative, normal mood and affect.

## 2018-02-08 NOTE — Patient Instructions (Signed)
Please tape tube to skin to keep secure. Avoid putting gauze underneath the bumper as this may be causing irritation. I will show pictures of you G-tube site to Dr. [name] and further recommendations to follow.

## 2018-02-10 ENCOUNTER — Encounter: Payer: Self-pay | Admitting: Gastroenterology

## 2018-02-10 NOTE — Assessment & Plan Note (Signed)
67 year old gentleman presenting for G-tube pain.  Unfortunately it is unclear how long he will require the G-tube as he is used predominantly for his nutritional support oral manipulation liquid and solids.  G-tube site looks good overall however he had some mild erythema and possible granulation tissue at the left lower border of the fistula tract.  I have requested patient to avoid using multiple layers of gauze underneath the bumper as this may be creating more tension and discomfort, also have suggested that he secure the tube to his body to decrease movement and to promote comfort.  I will discuss findings today with Dr. [name] and further recommendations to be provided.

## 2018-02-13 NOTE — Progress Notes (Signed)
cc'ed to pcp °

## 2018-04-05 ENCOUNTER — Telehealth: Payer: Self-pay | Admitting: Gastroenterology

## 2018-04-05 NOTE — Telephone Encounter (Signed)
Please touch base with patient and find out if his G-tube site is looking any better, previously a little red and mild discomfort with movement. I instructed him to avoid putting gauze between skin and bumper as there was extra tension and to secure his tube to the body to decrease movement.

## 2018-04-05 NOTE — Telephone Encounter (Signed)
Spoke with pt and his site is a lot better. Much improved. Pt said he will call back if he starts having a problem with the site again.

## 2018-04-10 NOTE — Telephone Encounter (Signed)
Good to hear. If he starts having any problems again, Dr. [name] would offer cauterizing with silver nitrate in endo for "granulation tissue" seen day of OV.

## 2018-04-10 NOTE — Telephone Encounter (Signed)
Noted  

## 2018-04-11 NOTE — Progress Notes (Signed)
Colin Wagner presents for follow up of radiation completed 06/22/17 to his left cheek and bilateral neck.   Pain issues, if any: He reports pain to his left neck area. He is using hydrocodone and lidocaine rinses for pain relief. Using a feeding tube?: Yes, He is instilling 6 cartons of nutritional supplements daily.  Weight changes, if any:  Wt Readings from Last 3 Encounters:  04/14/18 186 lb 3.2 oz (84.5 kg)  02/08/18 183 lb 6.4 oz (83.2 kg)  12/28/17 184 lb (83.5 kg)   Swallowing issues, if any: He is swallowing applesauce and pureed to chopped foods. He is drinking liquids by mouth.  Smoking or chewing tobacco? No Using fluoride trays daily? N/A Last ENT visit was on: [facility] Vertell Limber [facility] 09/05/[facility]. He will see Dr. Benjamine Mola on 05/11/18 Other notable issues, if any:  He reports "stiffness" to his left neck area.    BP 136/78   Pulse 80   Temp 97.8 F (36.6 C) (Oral)   Ht 5\' 11"  (1.803 m)   Wt 186 lb 3.2 oz (84.5 kg)   SpO2 96%   BMI 25.97 kg/m

## 2018-04-13 ENCOUNTER — Telehealth: Payer: Self-pay | Admitting: *Deleted

## 2018-04-13 NOTE — Telephone Encounter (Signed)
CALLED PATIENT TO REMIND OF LAB FOR 04-14-18 @ 1:30 PM @ [facility], SPOKE WITH PATIENT'S GIRLFRIEND [name] AND SHE IS AWARE OF THIS APPT.

## 2018-04-14 ENCOUNTER — Other Ambulatory Visit: Payer: Self-pay

## 2018-04-14 ENCOUNTER — Encounter: Payer: Self-pay | Admitting: Radiation Oncology

## 2018-04-14 ENCOUNTER — Ambulatory Visit
Admission: RE | Admit: 2018-04-14 | Discharge: 2018-04-14 | Disposition: A | Discharge status: Home / Self Care | Payer: Medicare Other | Source: Ambulatory Visit | Attending: Radiation Oncology | Admitting: Radiation Oncology

## 2018-04-14 VITALS — BP 136/78 | HR 80 | Temp 97.8°F | Ht 71.0 in | Wt 186.2 lb

## 2018-04-14 DIAGNOSIS — Z79899 Other long term (current) drug therapy: Secondary | ICD-10-CM | POA: Insufficient documentation

## 2018-04-14 DIAGNOSIS — C411 Malignant neoplasm of mandible: Secondary | ICD-10-CM

## 2018-04-14 DIAGNOSIS — Z85830 Personal history of malignant neoplasm of bone: Secondary | ICD-10-CM | POA: Diagnosis not present

## 2018-04-14 DIAGNOSIS — Z1329 Encounter for screening for other suspected endocrine disorder: Secondary | ICD-10-CM

## 2018-04-14 DIAGNOSIS — C060 Malignant neoplasm of cheek mucosa: Secondary | ICD-10-CM | POA: Diagnosis not present

## 2018-04-14 DIAGNOSIS — Z923 Personal history of irradiation: Secondary | ICD-10-CM | POA: Diagnosis not present

## 2018-04-14 DIAGNOSIS — Z7984 Long term (current) use of oral hypoglycemic drugs: Secondary | ICD-10-CM | POA: Insufficient documentation

## 2018-04-14 DIAGNOSIS — R5382 Chronic fatigue, unspecified: Secondary | ICD-10-CM

## 2018-04-14 DIAGNOSIS — Z08 Encounter for follow-up examination after completed treatment for malignant neoplasm: Secondary | ICD-10-CM | POA: Diagnosis present

## 2018-04-14 LAB — TSH: TSH: 1.451 u[IU]/mL (ref 0.320–4.118)

## 2018-04-14 MED ORDER — LIDOCAINE VISCOUS HCL 2 % MT SOLN: OROMUCOSAL | 5 refills | Status: DC

## 2018-04-14 NOTE — Progress Notes (Signed)
Radiation Oncology         [phone] ________________________________  Name: Colin Wagner MRN: [id number]  Date: 04/14/2018  DOB: 1951-04-15  Follow-Up Visit Note  CC: [name], MD  [name], MD  Diagnosis and Prior Radiotherapy:       ICD-10-CM   1. 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 He is accompanied by his family today.         Pain issues, if any: Pain to left neck which he is using hydrocodone with mild relief which is prescribed by his PCP.   Using a feeding tube?: Yes, he is instilling 6 cartons of nutritional supplements daily.   Weight changes, if any:  Wt Readings from Last 3 Encounters:  04/14/18 186 lb 3.2 oz (84.5 kg)  02/08/18 183 lb 6.4 oz (83.2 kg)  12/28/17 184 lb (83.5 kg)   Swallowing issues, if any: He is able to consume applesauce and pureed-chopped foods. He is able to consume liquids PO.   Smoking or chewing tobacco? No  Using fluoride trays daily? No  Last ENT visit was on: 09/05/2017 with Dr. Vertell Limber at [facility] He will follow up with Dr. Benjamine Mola on 05/11/2018.    ALLERGIES:  has No Known Allergies.  Meds: Current Outpatient Medications  Medication Sig Dispense Refill  . Acetaminophen (TYLENOL 8 HOUR PO) 2 tablets by Feeding Tube route 2 (two) times daily as needed.     Marland Kitchen albuterol (PROVENTIL HFA;VENTOLIN HFA) 108 (90 Base) MCG/ACT inhaler Inhale 1-2 puffs  into the lungs every 6 (six) hours as needed for wheezing or shortness of breath. 1 Inhaler 0  . atorvastatin (LIPITOR) 40 MG tablet Take 40 mg by mouth daily at 6 PM.     . B-D UF III MINI PEN NEEDLES 31G X 5 MM MISC   3  . B-D ULTRAFINE III SHORT PEN 31G X 8 MM MISC     . emollient (BIAFINE) cream Apply topically as needed. 454 g 0  . HYDROcodone-acetaminophen (NORCO) 7.5-325 MG tablet Take 1 tablet by mouth every 6 (six) hours as needed (pain). (Patient taking differently: 1 tablet every 6 (six) hours as needed (pain). ) 60 tablet 0  . ibuprofen (ADVIL,MOTRIN) 100 MG/5ML suspension Place 20 mLs (400 mg total) into feeding tube every 6 (six) hours as needed. Take with food. 473 mL 4  . lisinopril (PRINIVIL,ZESTRIL) 5 MG tablet Take 5 mg by mouth daily.     Marland Kitchen loratadine (CLARITIN) 10 MG tablet Take 10 mg by mouth daily.    Marland Kitchen LORazepam (ATIVAN) 1 MG tablet Take 1 mg by mouth 4 (four) times daily as needed for anxiety. For anxiety  5  . metFORMIN (GLUCOPHAGE-XR) 500 MG 24 hr tablet Take 1-2 tablets by mouth 3 (three) times daily. Takes 2 tablets in the morning, 1 tablet at lunch and 1 tablet at dinner  3  .  pantoprazole (PROTONIX) 20 MG tablet Take 20 mg by mouth daily.     Marland Kitchen senna (SENOKOT) 8.6 MG TABS tablet Take 1 tablet (8.6 mg total) by mouth at bedtime as needed for mild constipation. May be needed as pain medication can constipate. 120 each 1  . tamsulosin (FLOMAX) 0.4 MG CAPS capsule TAKE ONE CAPSULE BY MOUTH EVERY DAY FOR PROSTATE  3  . testosterone cypionate (DEPOTESTOSTERONE CYPIONATE) 200 MG/ML injection Inject 100 mg into the muscle every 14 (fourteen) days.     . traZODone (DESYREL) 50 MG tablet Take 50 mg by mouth at bedtime.    . Water For Irrigation, Sterile (FREE WATER) SOLN Place 100 mLs into feeding tube every 8 (eight) hours. 1000 mL 0  . lidocaine (XYLOCAINE) 2 % solution Patient: Mix 1part 2% viscous lidocaine, 1part H20. Swish &/or swallow 75mL of diluted mixture, 32min before  meals and at bedtime, up to QID 100 mL 5   No current facility-administered medications for this encounter.     Physical Findings: Wt Readings from Last 3 Encounters:  04/14/18 186 lb 3.2 oz (84.5 kg)  02/08/18 183 lb 6.4 oz (83.2 kg)  12/28/17 184 lb (83.5 kg)    height is 5\' 11"  (1.803 m) and weight is 186 lb 3.2 oz (84.5 kg). His oral temperature is 97.8 F (36.6 C). His blood pressure is 136/78 and his pulse is 80. His oxygen saturation is 96%.   General: Alert and oriented, in no acute distress. HEENT: The tissue flap appears healthy. He has somewhat dry mouth. Lymphedema in the left face particularly along the left jaw all above the surgical scar in the left neck.  Neck: He has fibrosis concentrated in the left neck consistent with prior surgery and radiation. No palpable masses noted. Heart: Regular in rate and rhythm with no murmurs, rubs, or gallops. Chest: Clear to auscultation bilaterally, with no rhonchi, wheezes, or rales.    Lab Findings: Lab Results  Component Value Date   WBC 7.4 04/25/2017   HGB 9.0 (L) 04/25/2017   HCT 27.4 (L) 04/25/2017   MCV 80.8 04/25/2017   PLT 352 04/25/2017    Lab Results  Component Value Date   TSH 1.414 05/04/2017    Radiographic Findings: No results found.  Impression/Plan:    1) Head and Neck Cancer Status: NED  2) Nutritional Status: Stable weight PEG tube: Using as a supplement via PEG  3) Risk Factors: The patient has been educated about risk factors including alcohol and tobacco abuse; they understand that avoidance of alcohol and tobacco is important to prevent recurrences as well as other cancers. The patient is not using tobacco.   4) Swallowing: He is able to eat softer foods but continues to have dry mouth and mouth sores.  I will send in a refill for his L[facility]aine to [facility] [place].  5) Dental: Encouraged to continue regular followup with dentistry, and dental hygiene including fluoride rinses.    6)  Thyroid function:pending results today Lab Results  Component Value Date   TSH 1.414 05/04/2017    7) Other: I recommended referral to Physical Therapy at [facility] in [place], [place] to aid with lymphedema to left face and jaw.   Will prescribe refill of [facility] [place], [place].   Discussed with the patient regarding potentially weaning off of his feeding tube, advised that if he is able to maintain his weight without use of his feeding tube for  1 month, then we can discuss having it removed on the follow up visit.   8) ENT: Follow up with Dr. Benjamine Mola as scheduled on 05/11/2018  8) Follow-up here in the middle of January 2020. The patient was encouraged to call with any issues or questions before then.    _____________________________________   [name], MD  This document serves as a record of services personally performed by [name], MD. It was created on her behalf by Rae Lips, a trained medical scribe. The creation of this record is based on the scribe's personal observations and the provider's statements to them. This document has been checked and approved by the attending provider.

## 2018-04-17 ENCOUNTER — Telehealth: Payer: Self-pay | Admitting: *Deleted

## 2018-04-17 NOTE — Telephone Encounter (Signed)
CALLED [facility] TO ARRANGE PT FOR THIS PT., THEY STATED THAT THEY WOULD GIVE THIS PATIENT A CALL AND SET HIM UP

## 2018-04-21 ENCOUNTER — Encounter (HOSPITAL_COMMUNITY): Payer: Self-pay | Admitting: Physical Therapy

## 2018-04-21 ENCOUNTER — Other Ambulatory Visit: Payer: Self-pay

## 2018-04-21 ENCOUNTER — Ambulatory Visit (HOSPITAL_COMMUNITY): Payer: Medicare Other | Attending: Radiation Oncology | Admitting: Physical Therapy

## 2018-04-21 DIAGNOSIS — I890 Lymphedema, not elsewhere classified: Secondary | ICD-10-CM | POA: Diagnosis not present

## 2018-04-21 NOTE — Patient Instructions (Addendum)
AROM: Lateral Neck Flexion    Slowly tilt head toward one shoulder, then the other. Hold each position _3___ seconds. Repeat 5____ times per set. Do _1___ sets per session. Do ___2_ sessions per day.  http://orth.exer.us/296   Copyright  VHI. All rights reserved.  AROM: Neck Extension    Bend head backward. Hold __2__ seconds. Repeat __5__ times per set. Do _1___ sets per session. Do __3__ sessions per day.  http://orth.exer.us/300   Copyright  VHI. All rights reserved.  AROM: Neck Flexion    Bend head forward. Hold __2__ seconds. Repeat _5___ times per set. Do ___1_ sets per session. Do ____ sessions per day. 3 http://orth.exer.us/298   Copyright  VHI. All rights reserved.  AROM: Neck Rotation    Turn head slowly to look over one shoulder, then the other. Hold each position _3___ seconds. Repeat __5__ times per set. Do _1___ sets per session. Do __3__ sessions per day.  http://orth.exer.us/294   Copyright  VHI. All rights reserved.

## 2018-04-21 NOTE — Therapy (Signed)
Colin Wagner, [place], [address] Phone: [phone]   Fax:  [phone]  Physical Therapy Evaluation  Patient Details  Name: Colin Wagner MRN: [id number] Date of Birth: [birth year] Referring Provider: [name]   Encounter Date: 04/21/2018  PT End of Session - 04/21/18 1402    Visit Number  1    Number of Visits  18    Date for PT Re-Evaluation  06/02/18    PT Start Time  1300    PT Stop Time  1345    PT Time Calculation (min)  45 min    Activity Tolerance  Patient tolerated treatment well    Behavior During Therapy  [facility] for tasks assessed/performed       Past Medical History:  Diagnosis Date  . Alcohol abuse   . Alcoholic pancreatitis 0160   admission  . Chronic back pain   . Chronic pancreatitis ([place])    based on ct findings 2016  . COPD (chronic obstructive pulmonary disease) ([place])   . Diabetes mellitus   . Diverticulosis   . Gastritis   . GERD (gastroesophageal reflux disease)   . Headache   . History of radiation therapy 05/12/17- 06/22/17   Left cheek and bilateral neck/ 60 Gy in 30 fractions to gross disease  . Hyperlipidemia   . Hypertension   . Peptic ulcer disease 1999   Per medical reports, no H pylori  . Renal cancer, left ([place]) 2012   he tells me that he has been released, ?and that he is free of cancer, and never had it to begin with.   . Right shoulder pain   . Testicular hypofunction     Past Surgical History:  Procedure Laterality Date  . COLONOSCOPY  2003   Dr. [name], polyps  . COLONOSCOPY  2005   Dr. [name], multiple diverticula  . COLONOSCOPY  2008   Dr. [name], diverticulosis  . COLONOSCOPY WITH PROPOFOL N/A 10/21/2016   Dr. [name]: Diverticulosis, two 5-7 mm polyps removed. path-tubular adenomas.  Next colonoscopy in 5 years.  . ESOPHAGOGASTRODUODENOSCOPY     Multiple EGDs. 1999 EGD showed gastric ulcers, no H pylori and benign biopsies performed by Dr.  [name]. 2001 gastric ulcer healed. Last EGD 2005 had gastritis.  . Left partial mandibulectomy, Scapular free flap reconstruction, selective neck dissection, tracheotomy, and resection of intraoral palate cancer. Left 03/01/2017   [facility]  . LUNG BIOPSY    . PARTIAL NEPHRECTOMY Left 2012  . POLYPECTOMY  10/21/2016   Procedure: POLYPECTOMY;  Surgeon: [name], MD;  [facility];  Service: Endoscopy;;  hepatic flexure x2    There were no vitals filed for this visit.   Subjective Assessment - 04/21/18 1306    Subjective  Colin Wagner states that he is having difficulty swallowing and he has aching and stiffness  in his neck.   He states that the swelling started several months ago.      Pertinent History  renal cancer, Cancer of Lt mandible with LT partial mandibulectomy on 03/01/2017, DM ,HTN     Currently in Pain?  Yes    Pain Score  8     Pain Location  Neck    Pain Orientation  Left    Pain Descriptors / Indicators  Tightness;Aching    Pain Type  Acute pain    Pain Onset  More than a month ago    Pain  Frequency  Intermittent    Aggravating Factors   lying down         [facility] PT Assessment - 04/21/18 0001      Assessment   Medical Diagnosis  cervical lymphedema    Referring Provider  [name]    Onset Date/Surgical Date  01/21/18    Prior Therapy  none for this issue       Precautions   Precautions  None      Restrictions   Weight Bearing Restrictions  No      Balance Screen   Has the patient fallen in the past 6 months  No    Has the patient had a decrease in activity level because of a fear of falling?   Yes    Is the patient reluctant to leave their home because of a fear of falling?   No      Home Film/video editor residence      Prior Function   Level of Independence  Independent      Cognition   Overall Cognitive Status  Within Functional Limits for tasks assessed       Observation/Other Assessments   Focus on Therapeutic Outcomes (FOTO)   --   life impact 51     ROM / Strength   AROM / PROM / Strength  AROM      AROM   AROM Assessment Site  Cervical    Cervical Flexion  40    Cervical Extension  35    Cervical - Right Side Bend  20    Cervical - Left Side Bend  16    Cervical - Right Rotation  24    Cervical - Left Rotation  38        LYMPHEDEMA/ONCOLOGY QUESTIONNAIRE - 04/21/18 0830      Type   Cancer Type  squamous cell of mandible       Date Lymphedema/Swelling Started   Date  09/06/17      Treatment   Past Radiation Treatment  Yes    Date  05/12/17    Body Site  Lt cheek, B cervical       What other symptoms do you have   Are you Having Heaviness or Tightness  Yes    Are you having Pain  No    Is there Decreased scar mobility  Yes      Lymphedema Stage   Stage  STAGE 2 SPONTANEOUSLY IRREVERSIBLE      Lymphedema Assessments   Lymphedema Assessments  Head and Neck      Head and Neck   Right Lateral Nostril at base of nose to medial tragus   11.3 cm    Left Lateral Nostril at base of nose to medial tragus   11 cm    Right Corner of mouth to where ear lobe meets face  10.4 cm    Left Corner of mouth to where ear lobe meets face  12 cm    4 cm superior to sternal notch around neck  40.5 cm    6 cm superior to sternal notch around neck  40 cm    8 cm superior to sternal notch around neck  42.1 cm    Other  upper to lower teeth jaw open: 3.5 cm              Objective measurements completed on examination: See above findings.      [facility] Adult PT Treatment/Exercise -  04/21/18 0001      Exercises   Exercises  --   Cervical ROM x 5      Manual Therapy   Manual Therapy  Manual Lymphatic Drainage (MLD)    Manual therapy comments  completed seperate from all other aspects of treatment     Manual Lymphatic Drainage (MLD)  thoracic and cervical area.              PT Education - 04/21/18 1353    Education  Details  HEP for cervical ROM as well as self massage    Person(s) Educated  Patient    Methods  Explanation;Handout;Demonstration;Tactile cues    Comprehension  Verbalized understanding       PT Short Term Goals - 04/21/18 1416      PT SHORT TERM GOAL #1   Title  Pt pain to be no greater than a 5/10 to allow pt to turn his head to at least 50 degrees in both directions  to assist in safety while driving    Time  3    Period  Weeks    Status  New    Target Date  05/12/18      PT SHORT TERM GOAL #2   Title  PT to be able to open his mouth 4.5 cm to make eating easier.     Time  3    Period  Weeks      PT SHORT TERM GOAL #3   Title  Pt measurements to have improved by 1 cm to allow pt to have improved self image.         PT Long Term Goals - 04/21/18 1422      PT LONG TERM GOAL #1   Title  PT to be able to turn his head 60 degrees or more in both directions to be able to see blind spot while driving    Time  6    Period  Weeks    Status  New    Target Date  06/02/18      PT LONG TERM GOAL #2   Title  PT measurement to be decreased 1.5 cm to demonstrate decreased facial and cervical edema to improve swallowing.     Time  6    Period  Weeks    Status  New      PT LONG TERM GOAL #3   Title  PT to be wearing his compression for 20 mintues 2x a day at least 5 days a week to keep edema down.     Time  6    Period  Weeks    Status  New      PT LONG TERM GOAL #4   Title  Pt to be able to demonstrate self massaging techniques to increase lymphatic flow to decrease swelling.     Time  6    Period  Weeks    Status  New             Plan - 04/21/18 1407    Clinical Impression Statement  Mr. Hoopere is a 67 yo male who recieved a Lt patial mandibulectomy with selective cervical dissection on 03/01/2017 secondary to Lt mandibular cancer.  He has had progessive edema, pain, and decreased ROM ever since .  He currently is being referred for lymphedema to improve the above  mentioned areas to improve his self image and mobility.     History and Personal Factors relevant to plan of care:  renal cancer , buccal mucosa cancer, DM, HTN     Clinical Presentation  Evolving    Clinical Decision Making  Moderate    Rehab Potential  Good    PT Frequency  3x / week    PT Duration  6 weeks    PT Treatment/Interventions  ADLs/Self Care Home Management;Therapeutic exercise;Patient/family education;Manual techniques;Compression bandaging    PT Next Visit Plan  Begin Total decongestive techniques; Add opening mouth to exercise program.     PT Home Exercise Plan  cervical ROM ; thoracic decongestion.     Consulted and Agree with Plan of Care  Patient       Patient will benefit from skilled therapeutic intervention in order to improve the following deficits and impairments:  Decreased activity tolerance, Decreased scar mobility, Decreased range of motion, Decreased mobility, Pain, Postural dysfunction, Increased edema  Visit Diagnosis: Lymphedema, not elsewhere classified     Problem List Patient Active Problem List   Diagnosis Date Noted  . Attention to G-tube ([place]) 02/08/2018  . UTI (urinary tract infection) 04/20/2017  . UTI due to Klebsiella species 04/20/2017  . Essential hypertension 04/20/2017  . Chronic pancreatitis ([place]) 04/20/2017  . Squamous cell carcinoma of mandible ([place]) 04/20/2017  . Malnutrition of moderate degree 04/20/2017  . Complicated UTI (urinary tract infection)   . Carcinoma of buccal mucosa ([place]) 01/05/2017  . Rectal bleeding 09/28/2016  . Hyponatremia 04/28/2015  . Abdominal pain 04/27/2015  . Acute pancreatitis 04/27/2015  . 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 ETOH abuse 04/27/2015  . DM type 2 (diabetes mellitus, type 2) ([place]) 04/27/2015  . Hypertension, uncontrolled 04/27/2015  . Hyperlipidemia 04/27/2015  . Hypertensive urgency 04/27/2015  . Alcohol abuse     Rayetta Humphrey, [place] CLT [phone] 04/21/2018,  2:26 PM  [place], [place], [address] Phone: [phone]   Fax:  [phone]  Name: CLIMMIE BUELOW MRN: [id number] Date of Birth: 15-Mar-1951

## 2018-04-25 ENCOUNTER — Ambulatory Visit (HOSPITAL_COMMUNITY): Payer: Medicare Other | Attending: Radiation Oncology | Admitting: Physical Therapy

## 2018-04-25 ENCOUNTER — Telehealth (HOSPITAL_COMMUNITY): Payer: Self-pay | Admitting: Family Medicine

## 2018-04-25 ENCOUNTER — Other Ambulatory Visit: Payer: Self-pay

## 2018-04-25 DIAGNOSIS — I890 Lymphedema, not elsewhere classified: Secondary | ICD-10-CM | POA: Insufficient documentation

## 2018-04-25 NOTE — Therapy (Signed)
[name], [place], [address] Phone: [phone]   Fax:  [phone]  Physical Therapy Treatment  Patient Details  Name: Colin Wagner MRN: [id number] Date of Birth: February 11, 1951 Referring Provider: [name]   Encounter Date: 04/25/2018  PT End of Session - 04/25/18 1217    Visit Number  2    Number of Visits  18    Date for PT Re-Evaluation  06/02/18    PT Start Time  1130    PT Stop Time  1212    PT Time Calculation (min)  42 min    Activity Tolerance  Patient tolerated treatment well    Behavior During Therapy  [facility] for tasks assessed/performed       Past Medical History:  Diagnosis Date  . Alcohol abuse   . Alcoholic pancreatitis 5573   admission  . Chronic back pain   . Chronic pancreatitis ([place])    based on ct findings 2016  . COPD (chronic obstructive pulmonary disease) ([place])   . Diabetes mellitus   . Diverticulosis   . Gastritis   . GERD (gastroesophageal reflux disease)   . Headache   . History of radiation therapy 05/12/17- 06/22/17   Left cheek and bilateral neck/ 60 Gy in 30 fractions to gross disease  . Hyperlipidemia   . Hypertension   . Peptic ulcer disease 1999   Per medical reports, no H pylori  . Renal cancer, left ([place]) 2012   he tells me that he has been released, ?and that he is free of cancer, and never had it to begin with.   . Right shoulder pain   . Testicular hypofunction     Past Surgical History:  Procedure Laterality Date  . COLONOSCOPY  2003   Dr. [name], polyps  . COLONOSCOPY  2005   Dr. [name], multiple diverticula  . COLONOSCOPY  2008   Dr. [name], diverticulosis  . COLONOSCOPY WITH PROPOFOL N/A 10/21/2016   Dr. [name]: Diverticulosis, two 5-7 mm polyps removed. path-tubular adenomas.  Next colonoscopy in 5 years.  . ESOPHAGOGASTRODUODENOSCOPY     Multiple EGDs. 1999 EGD showed gastric ulcers, no H pylori and benign biopsies performed by Dr. [name]. 2001 gastric ulcer healed. Last EGD 2005 had gastritis.  . Left partial mandibulectomy, Scapular free flap reconstruction, selective neck dissection, tracheotomy, and resection of intraoral palate cancer. Left 03/01/2017   [facility]  . LUNG BIOPSY    . PARTIAL NEPHRECTOMY Left 2012  . POLYPECTOMY  10/21/2016   Procedure: POLYPECTOMY;  Surgeon: [name], MD;  Location[facility];  Service: Endoscopy;;  hepatic flexure x2    There were no vitals filed for this visit.  Subjective Assessment - 04/25/18 1215    Subjective  PT states that he has been attempting to complete his self massaging and exercises.     Pertinent History  renal cancer, Cancer of Lt mandible with LT partial mandibulectomy on 03/01/2017, DM ,HTN     Currently in Pain?  No/denies    Pain Onset  More than a month ago                       [facility] Adult PT Treatment/Exercise - 04/25/18 0001      Exercises   Exercises  --   scapular and cervical retraction x 5 each, mandibular depres     Manual Therapy   Manual Therapy  Manual Lymphatic Drainage (MLD)    Manual therapy comments  completed seperate from all other aspects of treatment     Manual Lymphatic Drainage (MLD)  thoracic, cervical and facial to include lip and eye area.              PT Education - 04/25/18 1217    Education Details  new exercises    Person(s) Educated  Patient    Methods  Explanation;Demonstration;Handout       PT Short Term Goals - 04/25/18 1220      PT SHORT TERM GOAL #1   Title  Pt pain to be no greater than a 5/10 to allow pt to turn his head to at least 50 degrees in both directions  to assist in safety while driving    Time  3    Period  Weeks    Status  On-going      PT SHORT TERM GOAL #2   Title  PT to be able to open his mouth 4.5 cm to make eating easier.     Time  3    Period  Weeks    Status  On-going      PT SHORT TERM GOAL #3   Title  Pt measurements to  have improved by 1 cm to allow pt to have improved self image.     Status  On-going        PT Long Term Goals - 04/25/18 1220      PT LONG TERM GOAL #1   Title  PT to be able to turn his head 60 degrees or more in both directions to be able to see blind spot while driving    Time  6    Period  Weeks    Status  On-going      PT LONG TERM GOAL #2   Title  PT measurement to be decreased 1.5 cm to demonstrate decreased facial and cervical edema to improve swallowing.     Time  6    Period  Weeks    Status  On-going      PT LONG TERM GOAL #3   Title  PT to be wearing his compression for 20 mintues 2x a day at least 5 days a week to keep edema down.     Time  6    Period  Weeks    Status  On-going      PT LONG TERM GOAL #4   Title  Pt to be able to demonstrate self massaging techniques to increase lymphatic flow to decrease swelling.     Time  6    Period  Weeks    Status  On-going            Plan - 04/25/18 1217    Clinical Impression Statement  Pt has significant adhesion along submental and jaw line light myofascial techniques used along this area.  Noted decreased induration bilateral subnmental after manual.     Rehab Potential  Good    PT Frequency  3x / week    PT Duration  6 weeks    PT Treatment/Interventions  ADLs/Self Care Home Management;Therapeutic exercise;Patient/family education;Manual techniques;Compression bandaging    PT Next Visit Plan  Begin Total decongestive techniques; add swallowing exercises     PT Home Exercise Plan  cervical ROM ; thoracic decongestion.     Consulted and Agree with Plan of Care  Patient       Patient will benefit from skilled  therapeutic intervention in order to improve the following deficits and impairments:  Decreased activity tolerance, Decreased scar mobility, Decreased range of motion, Decreased mobility, Pain, Postural dysfunction, Increased edema  Visit Diagnosis: Lymphedema, not elsewhere classified     Problem  List Patient Active Problem List   Diagnosis Date Noted  . Attention to G-tube ([place]) 02/08/2018  . UTI (urinary tract infection) 04/20/2017  . UTI due to Klebsiella species 04/20/2017  . Essential hypertension 04/20/2017  . Chronic pancreatitis ([place]) 04/20/2017  . 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 Malnutrition of moderate degree 04/20/2017  . Complicated UTI (urinary tract infection)   . 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 Rectal bleeding 09/28/2016  . Hyponatremia 04/28/2015  . Abdominal pain 04/27/2015  . Acute pancreatitis 04/27/2015  . Chronic alcoholic pancreatitis ([place]) 04/27/2015  . ETOH abuse 04/27/2015  . DM type 2 (diabetes mellitus, type 2) ([place]) 04/27/2015  . Hypertension, uncontrolled 04/27/2015  . Hyperlipidemia 04/27/2015  . Hypertensive urgency 04/27/2015  . Alcohol abuse    Rayetta Humphrey, [place] CLT [phone] 04/25/2018, 12:20 PM  [place], [place], [address] Phone: [phone]   Fax:  [phone]  Name: RAINE ELSASS MRN: [id number] Date of Birth: 07/25/1951

## 2018-04-25 NOTE — Patient Instructions (Addendum)
Flexibility: Neck Retraction    Pull head straight back, keeping eyes and jaw level. Repeat ___10_ times per set. Do __1__ sets per session. Do __2__ sessions per day.  http://orth.exer.us/344   Copyright  VHI. All rights reserved.  Scapular Retraction (Standing)    With arms at sides, pinch shoulder blades together. Repeat __10__ times per set. Do __1_ sets per session. Do ____2 sessions per day.  http://orth.exer.us/944   Copyright  VHI. All rights reserved.  Depression: Center Complete    Slowly lower jaw without pulling jaw in toward neck. Hold _3_ seconds. Repeat _10___ times. Do _2___ sessions per day.  Copyright  VHI. All rights reserved.

## 2018-04-25 NOTE — Telephone Encounter (Signed)
04/25/18  I called to see if patient could come in today and the lady that answered the phone said he wasn't home but would call and see if he could come in today.

## 2018-04-26 ENCOUNTER — Ambulatory Visit (HOSPITAL_COMMUNITY): Payer: Medicare Other | Admitting: Physical Therapy

## 2018-04-26 DIAGNOSIS — I890 Lymphedema, not elsewhere classified: Secondary | ICD-10-CM | POA: Diagnosis not present

## 2018-04-26 NOTE — Therapy (Signed)
[place], [place], [address] Phone: [phone]   Fax:  [phone]  Physical Therapy Treatment  Patient Details  Name: Colin Wagner MRN: [id number] Date of Birth: [birth year] Referring Provider: [name]   Encounter Date: 04/26/2018  PT End of Session - 04/26/18 1547    Visit Number  3    Number of Visits  18    Date for PT Re-Evaluation  06/02/18    PT Start Time  1500    PT Stop Time  1540    PT Time Calculation (min)  40 min    Activity Tolerance  Patient tolerated treatment well    Behavior During Therapy  [facility] for tasks assessed/performed       Past Medical History:  Diagnosis Date  . Alcohol abuse   . Alcoholic pancreatitis 9381   admission  . Chronic back pain   . Chronic pancreatitis ([place])    based on ct findings 2016  . COPD (chronic obstructive pulmonary disease) ([place])   . Diabetes mellitus   . Diverticulosis   . Gastritis   . GERD (gastroesophageal reflux disease)   . Headache   . History of radiation therapy 05/12/17- 06/22/17   Left cheek and bilateral neck/ 60 Gy in 30 fractions to gross disease  . Hyperlipidemia   . Hypertension   . Peptic ulcer disease 1999   Per medical reports, no H pylori  . Renal cancer, left ([place]) 2012   he tells me that he has been released, ?and that he is free of cancer, and never had it to begin with.   . Right shoulder pain   . Testicular hypofunction     Past Surgical History:  Procedure Laterality Date  . COLONOSCOPY  2003   Dr. [name], polyps  . COLONOSCOPY  2005   Dr. [name], multiple diverticula  . COLONOSCOPY  2008   Dr. [name], diverticulosis  . COLONOSCOPY WITH PROPOFOL N/A 10/21/2016   Dr. [name]: Diverticulosis, two 5-7 mm polyps removed. path-tubular adenomas.  Next colonoscopy in 5 years.  . ESOPHAGOGASTRODUODENOSCOPY     Multiple EGDs. 1999 EGD showed gastric ulcers, no H pylori and benign biopsies performed by Dr. [name]. 2001 gastric ulcer healed. Last EGD 2005 had gastritis.  . Left partial mandibulectomy, Scapular free flap reconstruction, selective neck dissection, tracheotomy, and resection of intraoral palate cancer. Left 03/01/2017   [facility]  . LUNG BIOPSY    . PARTIAL NEPHRECTOMY Left 2012  . POLYPECTOMY  10/21/2016   Procedure: POLYPECTOMY;  Surgeon: [name], MD;  [facility];  Service: Endoscopy;;  hepatic flexure x2    There were no vitals filed for this visit.  Subjective Assessment - 04/26/18 1544    Subjective  Pt states that he feels like he can tell that the treatments are helping     Pertinent History  renal cancer, Cancer of Lt mandible with LT partial mandibulectomy on 03/01/2017, DM ,HTN     Currently in Pain?  No/denies    Pain Onset  More than a month ago                       [facility] Adult PT Treatment/Exercise - 04/26/18 0001      Exercises   Exercises  --   scapular/cervical retraction x 5 , mandibular depres/PROM     Manual Therapy   Manual Therapy  Manual  Lymphatic Drainage (MLD);Compression Bandaging    Manual therapy comments  completed seperate from all other aspects of treatment     Manual Lymphatic Drainage (MLD)  thoracic, cervical and facial to include lip and eye area.     Compression Bandaging  pt given facial compression made by therapist instructed to attempt to wear for 3 hours.                PT Short Term Goals - 04/25/18 1220      PT SHORT TERM GOAL #1   Title  Pt pain to be no greater than a 5/10 to allow pt to turn his head to at least 50 degrees in both directions  to assist in safety while driving    Time  3    Period  Weeks    Status  On-going      PT SHORT TERM GOAL #2   Title  PT to be able to open his mouth 4.5 cm to make eating easier.     Time  3    Period  Weeks    Status  On-going      PT SHORT TERM GOAL #3   Title  Pt measurements to have improved by 1 cm to  allow pt to have improved self image.     Status  On-going        PT Long Term Goals - 04/25/18 1220      PT LONG TERM GOAL #1   Title  PT to be able to turn his head 60 degrees or more in both directions to be able to see blind spot while driving    Time  6    Period  Weeks    Status  On-going      PT LONG TERM GOAL #2   Title  PT measurement to be decreased 1.5 cm to demonstrate decreased facial and cervical edema to improve swallowing.     Time  6    Period  Weeks    Status  On-going      PT LONG TERM GOAL #3   Title  PT to be wearing his compression for 20 mintues 2x a day at least 5 days a week to keep edema down.     Time  6    Period  Weeks    Status  On-going      PT LONG TERM GOAL #4   Title  Pt to be able to demonstrate self massaging techniques to increase lymphatic flow to decrease swelling.     Time  6    Period  Weeks    Status  On-going            Plan - 04/26/18 1547    Clinical Impression Statement  Began gentle scar massage as well as jt mobs to improve cervical ROM.  Extra time taken in submental area.  Pt given compression garment for submental and jaw area.     Rehab Potential  Good    PT Frequency  3x / week    PT Duration  6 weeks    PT Treatment/Interventions  ADLs/Self Care Home Management;Therapeutic exercise;Patient/family education;Manual techniques;Compression bandaging    PT Next Visit Plan  Add swallowing and remeasure next treatment.     PT Home Exercise Plan  cervical ROM ; thoracic decongestion.     Consulted and Agree with Plan of Care  Patient       Patient will benefit from skilled therapeutic intervention in  order to improve the following deficits and impairments:  Decreased activity tolerance, Decreased scar mobility, Decreased range of motion, Decreased mobility, Pain, Postural dysfunction, Increased edema  Visit Diagnosis: Lymphedema, not elsewhere classified     Problem List Patient Active Problem List   Diagnosis  Date Noted  . Attention to G-tube ([place]) 02/08/2018  . UTI (urinary tract infection) 04/20/2017  . UTI due to Klebsiella species 04/20/2017  . Essential hypertension 04/20/2017  . Chronic pancreatitis ([place]) 04/20/2017  . 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 Malnutrition of moderate degree 04/20/2017  . Complicated UTI (urinary tract infection)   . 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 Rectal bleeding 09/28/2016  . Hyponatremia 04/28/2015  . Abdominal pain 04/27/2015  . Acute pancreatitis 04/27/2015  . Chronic alcoholic pancreatitis ([place]) 04/27/2015  . ETOH abuse 04/27/2015  . DM type 2 (diabetes mellitus, type 2) ([place]) 04/27/2015  . Hypertension, uncontrolled 04/27/2015  . Hyperlipidemia 04/27/2015  . Hypertensive urgency 04/27/2015  . Alcohol abuse     Rayetta Humphrey, [place] CLT [phone] 04/26/2018, 3:49 PM  [place] [address] [place], [place], [address] Phone: [phone]   Fax:  [phone]  Name: KILO ESHELMAN MRN: [id number] Date of Birth: 1950-12-09

## 2018-04-27 ENCOUNTER — Encounter

## 2018-04-27 ENCOUNTER — Ambulatory Visit (HOSPITAL_COMMUNITY): Payer: Medicare Other | Admitting: Physical Therapy

## 2018-04-27 DIAGNOSIS — I890 Lymphedema, not elsewhere classified: Secondary | ICD-10-CM

## 2018-04-27 NOTE — Therapy (Signed)
Colin Wagner, [place], [address] Phone: [phone]   Fax:  [phone]  Physical Therapy Treatment  Patient Details  Name: Colin Wagner MRN: [id number] Date of Birth: March 27, 1951 Referring Provider: [name]   Encounter Date: 04/27/2018  PT End of Session - 04/27/18 1212    Visit Number  4    Number of Visits  18    Date for PT Re-Evaluation  06/02/18    PT Start Time  1120    PT Stop Time  1205    PT Time Calculation (min)  45 min    Activity Tolerance  Patient tolerated treatment well    Behavior During Therapy  [facility] for tasks assessed/performed       Past Medical History:  Diagnosis Date  . Alcohol abuse   . Alcoholic pancreatitis 4481   admission  . Chronic back pain   . Chronic pancreatitis ([place])    based on ct findings 2016  . COPD (chronic obstructive pulmonary disease) ([place])   . Diabetes mellitus   . Diverticulosis   . Gastritis   . GERD (gastroesophageal reflux disease)   . Headache   . History of radiation therapy 05/12/17- 06/22/17   Left cheek and bilateral neck/ 60 Gy in 30 fractions to gross disease  . Hyperlipidemia   . Hypertension   . Peptic ulcer disease 1999   Per medical reports, no H pylori  . Renal cancer, left ([place]) 2012   he tells me that he has been released, ?and that he is free of cancer, and never had it to begin with.   . Right shoulder pain   . Testicular hypofunction     Past Surgical History:  Procedure Laterality Date  . COLONOSCOPY  2003   Dr. [name], polyps  . COLONOSCOPY  2005   Dr. [name], multiple diverticula  . COLONOSCOPY  2008   Dr. [name], diverticulosis  . COLONOSCOPY WITH PROPOFOL N/A 10/21/2016   Dr. [name]: Diverticulosis, two 5-7 mm polyps removed. path-tubular adenomas.  Next colonoscopy in 5 years.  . ESOPHAGOGASTRODUODENOSCOPY     Multiple EGDs. 1999 EGD showed gastric ulcers, no H pylori and benign biopsies performed by Dr. [name]. 2001 gastric ulcer healed. Last EGD 2005 had gastritis.  . Left partial mandibulectomy, Scapular free flap reconstruction, selective neck dissection, tracheotomy, and resection of intraoral palate cancer. Left 03/01/2017   [facility]  . LUNG BIOPSY    . PARTIAL NEPHRECTOMY Left 2012  . POLYPECTOMY  10/21/2016   Procedure: POLYPECTOMY;  Surgeon: [name], MD;  [facility];  Service: Endoscopy;;  hepatic flexure x2    There were no vitals filed for this visit.  Subjective Assessment - 04/27/18 1209    Subjective  Pt states that he was able to wear the compression without issues.  States he is planning on wearing it when he gets home.     Pertinent History  renal cancer, Cancer of Lt mandible with LT partial mandibulectomy on 03/01/2017, DM ,HTN     Currently in Pain?  No/denies    Pain Onset  More than a month ago         [facility] PT Assessment - 04/27/18 0001      Assessment   Medical Diagnosis  cervical lymphedema    Onset Date/Surgical Date  01/21/18    Prior Therapy  none for this issue  Precautions   Precautions  None      Restrictions   Weight Bearing Restrictions  No      Home Film/video editor residence      Prior Function   Level of Independence  Independent      Cognition   Overall Cognitive Status  Within Functional Limits for tasks assessed      Observation/Other Assessments   Focus on Therapeutic Outcomes (FOTO)   --   life impact 51     AROM   Cervical Flexion  50   was 40   Cervical Extension  35   was 35    Cervical - Right Side Bend  20   was 20   Cervical - Left Side Bend  25   was 16   Cervical - Right Rotation  40   was 24    Cervical - Left Rotation  50   was 38        LYMPHEDEMA/ONCOLOGY QUESTIONNAIRE - 04/27/18 1129      Head and Neck   Right Lateral Nostril at base of nose to medial tragus   11 cm    Left Lateral Nostril at base of nose to medial tragus    11.5 cm    Right Corner of mouth to where ear lobe meets face  10 cm    Left Corner of mouth to where ear lobe meets face  11.6 cm                [facility] Adult PT Treatment/Exercise - 04/27/18 0001      Exercises   Exercises  Neck   scapular/cervical retraction x 5 , mandibular depres/PROM     Neck Exercises: Seated   Other Seated Exercise  bulldog (lip to nose with extension x 5      Neck Exercises: Supine   Other Supine Exercise  Shaker exercises x 15 seconds 5 reps;       Manual Therapy   Manual Therapy  Myofascial release;Manual Lymphatic Drainage (MLD);Compression Bandaging    Manual therapy comments  completed seperate from all other aspects of treatment     Myofascial Release  for scar under mandibular.     Manual Lymphatic Drainage (MLD)  thoracic, cervical and facial to include lip and eye area.     Compression Bandaging  pt given facial compression made by therapist instructed to attempt to wear for 3 hours.                PT Short Term Goals - 04/25/18 1220      PT SHORT TERM GOAL #1   Title  Pt pain to be no greater than a 5/10 to allow pt to turn his head to at least 50 degrees in both directions  to assist in safety while driving    Time  3    Period  Weeks    Status  On-going      PT SHORT TERM GOAL #2   Title  PT to be able to open his mouth 4.5 cm to make eating easier.     Time  3    Period  Weeks    Status  On-going      PT SHORT TERM GOAL #3   Title  Pt measurements to have improved by 1 cm to allow pt to have improved self image.     Status  On-going        PT Long Term Goals -  04/25/18 1220      PT LONG TERM GOAL #1   Title  PT to be able to turn his head 60 degrees or more in both directions to be able to see blind spot while driving    Time  6    Period  Weeks    Status  On-going      PT LONG TERM GOAL #2   Title  PT measurement to be decreased 1.5 cm to demonstrate decreased facial and cervical edema to improve  swallowing.     Time  6    Period  Weeks    Status  On-going      PT LONG TERM GOAL #3   Title  PT to be wearing his compression for 20 mintues 2x a day at least 5 days a week to keep edema down.     Time  6    Period  Weeks    Status  On-going      PT LONG TERM GOAL #4   Title  Pt to be able to demonstrate self massaging techniques to increase lymphatic flow to decrease swelling.     Time  6    Period  Weeks    Status  On-going            Plan - 04/27/18 1212    Clinical Impression Statement  Added new exercises to HEP, Measurements taken with overall improvement noted.      Rehab Potential  Good    PT Frequency  3x / week    PT Duration  6 weeks    PT Treatment/Interventions  ADLs/Self Care Home Management;Therapeutic exercise;Patient/family education;Manual techniques;Compression bandaging    PT Next Visit Plan  Add left/up and right/up to bull dog exercises.  Continue myofascial for scar release.    PT Home Exercise Plan  cervical ROM ; thoracic decongestion.     Consulted and Agree with Plan of Care  Patient       Patient will benefit from skilled therapeutic intervention in order to improve the following deficits and impairments:  Decreased activity tolerance, Decreased scar mobility, Decreased range of motion, Decreased mobility, Pain, Postural dysfunction, Increased edema  Visit Diagnosis: Lymphedema, not elsewhere classified     Problem List Patient Active Problem List   Diagnosis Date Noted  . Attention to G-tube ([place]) 02/08/2018  . UTI (urinary tract infection) 04/20/2017  . UTI due to Klebsiella species 04/20/2017  . Essential hypertension 04/20/2017  . Chronic pancreatitis ([place]) 04/20/2017  . 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 Malnutrition of moderate degree 04/20/2017  . Complicated UTI (urinary tract infection)   . 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 Rectal bleeding 09/28/2016  . Hyponatremia 04/28/2015  .  Abdominal pain 04/27/2015  . Acute pancreatitis 04/27/2015  . Chronic alcoholic pancreatitis ([place]) 04/27/2015  . ETOH abuse 04/27/2015  . DM type 2 (diabetes mellitus, type 2) ([place]) 04/27/2015  . Hypertension, uncontrolled 04/27/2015  . Hyperlipidemia 04/27/2015  . Hypertensive urgency 04/27/2015  . Alcohol abuse    Rayetta Humphrey, [place] CLT [phone] 04/27/2018, 12:15 PM  [place], [place], [address] Phone: [phone]   Fax:  [phone]  Name: SHAYE LAGACE MRN: [id number] Date of Birth: 1950/12/02

## 2018-05-01 ENCOUNTER — Ambulatory Visit (HOSPITAL_COMMUNITY): Payer: Medicare Other | Admitting: Physical Therapy

## 2018-05-01 DIAGNOSIS — I890 Lymphedema, not elsewhere classified: Secondary | ICD-10-CM

## 2018-05-01 NOTE — Therapy (Signed)
[place], [place], [address] Phone: [phone]   Fax:  [phone]  Physical Therapy Treatment  Patient Details  Name: Colin Wagner MRN: [id number] Date of Birth: 1951-04-24 Referring Provider: [name]   Encounter Date: 05/01/2018  PT End of Session - 05/01/18 1520    Visit Number  5    Number of Visits  18    Date for PT Re-Evaluation  06/02/18    PT Start Time  2202    PT Stop Time  1115    PT Time Calculation (min)  40 min    Activity Tolerance  Patient tolerated treatment well    Behavior During Therapy  [facility] for tasks assessed/performed       Past Medical History:  Diagnosis Date  . Alcohol abuse   . Alcoholic pancreatitis 5427   admission  . Chronic back pain   . Chronic pancreatitis ([place])    based on ct findings 2016  . COPD (chronic obstructive pulmonary disease) ([place])   . Diabetes mellitus   . Diverticulosis   . Gastritis   . GERD (gastroesophageal reflux disease)   . Headache   . History of radiation therapy 05/12/17- 06/22/17   Left cheek and bilateral neck/ 60 Gy in 30 fractions to gross disease  . Hyperlipidemia   . Hypertension   . Peptic ulcer disease 1999   Per medical reports, no H pylori  . Renal cancer, left ([place]) 2012   he tells me that he has been released, ?and that he is free of cancer, and never had it to begin with.   . Right shoulder pain   . Testicular hypofunction     Past Surgical History:  Procedure Laterality Date  . COLONOSCOPY  2003   Dr. [name], polyps  . COLONOSCOPY  2005   Dr. [name], multiple diverticula  . COLONOSCOPY  2008   Dr. [name], diverticulosis  . COLONOSCOPY WITH PROPOFOL N/A 10/21/2016   Dr. [name]: Diverticulosis, two 5-7 mm polyps removed. path-tubular adenomas.  Next colonoscopy in 5 years.  . ESOPHAGOGASTRODUODENOSCOPY     Multiple EGDs. 1999 EGD showed gastric ulcers, no H pylori and benign biopsies performed by Dr. [name]. 2001 gastric ulcer healed. Last EGD 2005 had gastritis.  . Left partial mandibulectomy, Scapular free flap reconstruction, selective neck dissection, tracheotomy, and resection of intraoral palate cancer. Left 03/01/2017   [facility]  . LUNG BIOPSY    . PARTIAL NEPHRECTOMY Left 2012  . POLYPECTOMY  10/21/2016   Procedure: POLYPECTOMY;  Surgeon: [name], MD;  Location[facility];  Service: Endoscopy;;  hepatic flexure x2    There were no vitals filed for this visit.  Subjective Assessment - 05/01/18 1514    Subjective  PT states that he brought his significant other to learn how to complete the manual.  He is wearing his compession     Pertinent History  renal cancer, Cancer of Lt mandible with LT partial mandibulectomy on 03/01/2017, DM ,HTN     Currently in Pain?  Yes    Pain Score  3     Pain Location  Jaw    Pain Orientation  Left    Pain Descriptors / Indicators  Aching;Discomfort    Pain Type  Chronic pain    Pain Onset  More than a month ago    Pain Frequency  Intermittent  [facility] Adult PT Treatment/Exercise - 05/01/18 0001      Exercises   Exercises  Neck      Neck Exercises: Seated   Other Seated Exercise  bulldog up to right/ up to left x 5; swallowing exercises tongue to roof of mouth swallow; bite tongue swallow x 5 each.       Neck Exercises: Supine   Other Supine Exercise  Shaker exercises x 15 seconds 5 reps;       Manual Therapy   Manual Therapy  Myofascial release;Manual Lymphatic Drainage (MLD);Compression Bandaging    Manual therapy comments  completed seperate from all other aspects of treatment     Myofascial Release  for scar under mandibular.     Manual Lymphatic Drainage (MLD)  thoracic, cervical and facial to include lip and eye area.     Compression Bandaging  pt given facial compression made by therapist instructed to attempt to wear for 3 hours.              PT  Education - 05/01/18 1519    Education Details  Educated significant other on manual techniques.     Person(s) Educated  Patient;Other (comment)    Methods  Explanation;Demonstration    Comprehension  Verbalized understanding       PT Short Term Goals - 04/25/18 1220      PT SHORT TERM GOAL #1   Title  Pt pain to be no greater than a 5/10 to allow pt to turn his head to at least 50 degrees in both directions  to assist in safety while driving    Time  3    Period  Weeks    Status  On-going      PT SHORT TERM GOAL #2   Title  PT to be able to open his mouth 4.5 cm to make eating easier.     Time  3    Period  Weeks    Status  On-going      PT SHORT TERM GOAL #3   Title  Pt measurements to have improved by 1 cm to allow pt to have improved self image.     Status  On-going        PT Long Term Goals - 04/25/18 1220      PT LONG TERM GOAL #1   Title  PT to be able to turn his head 60 degrees or more in both directions to be able to see blind spot while driving    Time  6    Period  Weeks    Status  On-going      PT LONG TERM GOAL #2   Title  PT measurement to be decreased 1.5 cm to demonstrate decreased facial and cervical edema to improve swallowing.     Time  6    Period  Weeks    Status  On-going      PT LONG TERM GOAL #3   Title  PT to be wearing his compression for 20 mintues 2x a day at least 5 days a week to keep edema down.     Time  6    Period  Weeks    Status  On-going      PT LONG TERM GOAL #4   Title  Pt to be able to demonstrate self massaging techniques to increase lymphatic flow to decrease swelling.     Time  6    Period  Weeks    Status  On-going  Plan - 05/01/18 1520    Clinical Impression Statement  Significant other was able to come to treatment.  Explained how to complete MLD and the goals behind it; significant other verbalized understanding.  Added [place] exercise(bite tongue and swallow).      Rehab Potential  Good    PT  Frequency  3x / week    PT Duration  6 weeks    PT Treatment/Interventions  ADLs/Self Care Home Management;Therapeutic exercise;Patient/family education;Manual techniques;Compression bandaging    PT Next Visit Plan  Add pitch glides for 10 seconds(ee high 10 seconds low 10 seconds.) Continue myofascial for scar release.    PT Home Exercise Plan  cervical ROM ; thoracic decongestion.     Consulted and Agree with Plan of Care  Patient       Patient will benefit from skilled therapeutic intervention in order to improve the following deficits and impairments:  Decreased activity tolerance, Decreased scar mobility, Decreased range of motion, Decreased mobility, Pain, Postural dysfunction, Increased edema  Visit Diagnosis: Lymphedema, not elsewhere classified     Problem List Patient Active Problem List   Diagnosis Date Noted  . Attention to G-tube ([place]) 02/08/2018  . UTI (urinary tract infection) 04/20/2017  . UTI due to Klebsiella species 04/20/2017  . Essential hypertension 04/20/2017  . Chronic pancreatitis ([place]) 04/20/2017  . 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 Malnutrition of moderate degree 04/20/2017  . Complicated UTI (urinary tract infection)   . 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 Rectal bleeding 09/28/2016  . Hyponatremia 04/28/2015  . Abdominal pain 04/27/2015  . Acute pancreatitis 04/27/2015  . Chronic alcoholic pancreatitis ([place]) 04/27/2015  . ETOH abuse 04/27/2015  . DM type 2 (diabetes mellitus, type 2) ([place]) 04/27/2015  . Hypertension, uncontrolled 04/27/2015  . Hyperlipidemia 04/27/2015  . Hypertensive urgency 04/27/2015  . Alcohol abuse    Rayetta Humphrey, [place] CLT [phone] 05/01/2018, 3:27 PM  [place] [address] [place], [place], [address] Phone: [phone]   Fax:  [phone]  Name: Colin Wagner MRN: [id number] Date of Birth: 12/09/1950

## 2018-05-03 ENCOUNTER — Ambulatory Visit (HOSPITAL_COMMUNITY): Payer: Medicare Other | Admitting: Physical Therapy

## 2018-05-03 DIAGNOSIS — I890 Lymphedema, not elsewhere classified: Secondary | ICD-10-CM | POA: Diagnosis not present

## 2018-05-03 NOTE — Therapy (Signed)
[place], [place], [address] Phone: [phone]   Fax:  [phone]  Physical Therapy Treatment  Patient Details  Name: Colin Wagner MRN: [id number] Date of Birth: 1950-10-26 Referring Provider: [name]    Encounter Date: 05/03/2018  PT End of Session - 05/03/18 1606    Visit Number  6    Number of Visits  18    Date for PT Re-Evaluation  06/02/18    PT Start Time  7591    PT Stop Time  1429    PT Time Calculation (min)  40 min    Activity Tolerance  Patient tolerated treatment well    Behavior During Therapy  [facility] for tasks assessed/performed       Past Medical History:  Diagnosis Date  . Alcohol abuse   . Alcoholic pancreatitis 6384   admission  . Chronic back pain   . Chronic pancreatitis ([place])    based on ct findings 2016  . COPD (chronic obstructive pulmonary disease) ([place])   . Diabetes mellitus   . Diverticulosis   . Gastritis   . GERD (gastroesophageal reflux disease)   . Headache   . History of radiation therapy 05/12/17- 06/22/17   Left cheek and bilateral neck/ 60 Gy in 30 fractions to gross disease  . Hyperlipidemia   . Hypertension   . Peptic ulcer disease 1999   Per medical reports, no H pylori  . Renal cancer, left ([place]) 2012   he tells me that he has been released, ?and that he is free of cancer, and never had it to begin with.   . Right shoulder pain   . Testicular hypofunction     Past Surgical History:  Procedure Laterality Date  . COLONOSCOPY  2003   Dr. [name], polyps  . COLONOSCOPY  2005   Dr. [name], multiple diverticula  . COLONOSCOPY  2008   Dr. [name], diverticulosis  . COLONOSCOPY WITH PROPOFOL N/A 10/21/2016   Dr. [name]: Diverticulosis, two 5-7 mm polyps removed. path-tubular adenomas.  Next colonoscopy in 5 years.  . ESOPHAGOGASTRODUODENOSCOPY     Multiple EGDs. 1999 EGD showed gastric ulcers, no H pylori and benign biopsies performed by Dr.  [name]. 2001 gastric ulcer healed. Last EGD 2005 had gastritis.  . Left partial mandibulectomy, Scapular free flap reconstruction, selective neck dissection, tracheotomy, and resection of intraoral palate cancer. Left 03/01/2017   [facility]  . LUNG BIOPSY    . PARTIAL NEPHRECTOMY Left 2012  . POLYPECTOMY  10/21/2016   Procedure: POLYPECTOMY;  Surgeon: [name], MD;  [facility];  Service: Endoscopy;;  hepatic flexure x2    There were no vitals filed for this visit.  Subjective Assessment - 05/03/18 1605    Subjective  Pt states that he is startiing to be able to swallow better.     Pertinent History  renal cancer, Cancer of Lt mandible with LT partial mandibulectomy on 03/01/2017, DM ,HTN     Currently in Pain?  No/denies    Pain Onset  More than a month ago         [facility] PT Assessment - 05/03/18 0001      Assessment   Medical Diagnosis  cervical lymphedema    Referring Provider  [name]     Onset Date/Surgical Date  01/21/18    Prior Therapy  none for this issue       Precautions  Precautions  None      Restrictions   Weight Bearing Restrictions  No      Home Film/video editor residence      Prior Function   Level of Independence  Independent      Cognition   Overall Cognitive Status  Within Functional Limits for tasks assessed      Observation/Other Assessments   Focus on Therapeutic Outcomes (FOTO)   --   life impact 51     AROM   Cervical Flexion  50   was 40   Cervical Extension  40   was 35    Cervical - Right Side Bend  20   was 20   Cervical - Left Side Bend  28   was 16   Cervical - Right Rotation  43   was 24    Cervical - Left Rotation  50   was 38        LYMPHEDEMA/ONCOLOGY QUESTIONNAIRE - 05/03/18 1428      Head and Neck   Right Lateral Nostril at base of nose to medial tragus   10.5 cm    Left Lateral Nostril at base of nose to medial tragus   11 cm    Right  Corner of mouth to where ear lobe meets face  9.5 cm    Left Corner of mouth to where ear lobe meets face  11.2 cm                [facility] Adult PT Treatment/Exercise - 05/03/18 0001      Exercises   Exercises  Neck      Neck Exercises: Seated   Other Seated Exercise  bulldog up to right/ up to left x 5; swallowing exercises tongue to roof of mouth swallow; bite tongue swallow x 5 each.     Other Seated Exercise  Long low "eee"; long high "eee"      Neck Exercises: Supine   Other Supine Exercise  Shaker exercises x 15 seconds 5 reps;       Manual Therapy   Manual Therapy  Myofascial release;Manual Lymphatic Drainage (MLD);Compression Bandaging    Manual therapy comments  completed seperate from all other aspects of treatment     Myofascial Release  for scar under mandibular.     Manual Lymphatic Drainage (MLD)  thoracic, cervical and facial to include lip and eye area.     Compression Bandaging  pt given facial compression made by therapist instructed to attempt to wear for 3 hours.                PT Short Term Goals - 04/25/18 1220      PT SHORT TERM GOAL #1   Title  Pt pain to be no greater than a 5/10 to allow pt to turn his head to at least 50 degrees in both directions  to assist in safety while driving    Time  3    Period  Weeks    Status  On-going      PT SHORT TERM GOAL #2   Title  PT to be able to open his mouth 4.5 cm to make eating easier.     Time  3    Period  Weeks    Status  On-going      PT SHORT TERM GOAL #3   Title  Pt measurements to have improved by 1 cm to allow pt to have improved self image.  Status  On-going        PT Long Term Goals - 04/25/18 1220      PT LONG TERM GOAL #1   Title  PT to be able to turn his head 60 degrees or more in both directions to be able to see blind spot while driving    Time  6    Period  Weeks    Status  On-going      PT LONG TERM GOAL #2   Title  PT measurement to be decreased 1.5 cm to  demonstrate decreased facial and cervical edema to improve swallowing.     Time  6    Period  Weeks    Status  On-going      PT LONG TERM GOAL #3   Title  PT to be wearing his compression for 20 mintues 2x a day at least 5 days a week to keep edema down.     Time  6    Period  Weeks    Status  On-going      PT LONG TERM GOAL #4   Title  Pt to be able to demonstrate self massaging techniques to increase lymphatic flow to decrease swelling.     Time  6    Period  Weeks    Status  On-going            Plan - 05/03/18 1607    Clinical Impression Statement  Measurements taken with noted improvement.  Pt states that he is able to notice improvement in swallowing as well.  Pt instructed in pitch exercises.  PT continues to have significant restrictionas and edema and will continue to benefit from skilled physical therapy.     Rehab Potential  Good    PT Frequency  3x / week    PT Duration  6 weeks    PT Treatment/Interventions  ADLs/Self Care Home Management;Therapeutic exercise;Patient/family education;Manual techniques;Compression bandaging    PT Next Visit Plan  ) Continue myofascial for scar release.    PT Home Exercise Plan  cervical ROM ; thoracic decongestion.     Consulted and Agree with Plan of Care  Patient       Patient will benefit from skilled therapeutic intervention in order to improve the following deficits and impairments:  Decreased activity tolerance, Decreased scar mobility, Decreased range of motion, Decreased mobility, Pain, Postural dysfunction, Increased edema  Visit Diagnosis: Lymphedema, not elsewhere classified     Problem List Patient Active Problem List   Diagnosis Date Noted  . Attention to G-tube ([place]) 02/08/2018  . UTI (urinary tract infection) 04/20/2017  . UTI due to Klebsiella species 04/20/2017  . Essential hypertension 04/20/2017  . Chronic pancreatitis ([place]) 04/20/2017  . 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  Malnutrition of moderate degree 04/20/2017  . Complicated UTI (urinary tract infection)   . 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 Rectal bleeding 09/28/2016  . Hyponatremia 04/28/2015  . Abdominal pain 04/27/2015  . Acute pancreatitis 04/27/2015  . Chronic alcoholic pancreatitis ([place]) 04/27/2015  . ETOH abuse 04/27/2015  . DM type 2 (diabetes mellitus, type 2) ([place]) 04/27/2015  . Hypertension, uncontrolled 04/27/2015  . Hyperlipidemia 04/27/2015  . Hypertensive urgency 04/27/2015  . Alcohol abuse   Rayetta Humphrey, [place] CLT [phone] 05/03/2018, 4:13 PM  [facility] [address] [place], [place], [address] Phone: [phone]   Fax:  [phone]  Name: Colin Wagner MRN: [id number] Date  of Birth: 11/03/1950

## 2018-05-04 ENCOUNTER — Ambulatory Visit (HOSPITAL_COMMUNITY): Payer: Medicare Other | Admitting: Physical Therapy

## 2018-05-04 DIAGNOSIS — I890 Lymphedema, not elsewhere classified: Secondary | ICD-10-CM

## 2018-05-04 NOTE — Therapy (Signed)
[place], [place], [address] Phone: [phone]   Fax:  [phone]  Physical Therapy Treatment  Patient Details  Name: Colin Wagner MRN: [id number] Date of Birth: Jan 16, 1951 Referring Provider: [name]    Encounter Date: 05/04/2018  PT End of Session - 05/04/18 1519    Visit Number  7    Number of Visits  18    Date for PT Re-Evaluation  06/02/18    PT Start Time  7591    PT Stop Time  1517    PT Time Calculation (min)  41 min    Activity Tolerance  Patient tolerated treatment well    Behavior During Therapy  [facility] for tasks assessed/performed       Past Medical History:  Diagnosis Date  . Alcohol abuse   . Alcoholic pancreatitis 6384   admission  . Chronic back pain   . Chronic pancreatitis ([place])    based on ct findings 2016  . COPD (chronic obstructive pulmonary disease) ([place])   . Diabetes mellitus   . Diverticulosis   . Gastritis   . GERD (gastroesophageal reflux disease)   . Headache   . History of radiation therapy 05/12/17- 06/22/17   Left cheek and bilateral neck/ 60 Gy in 30 fractions to gross disease  . Hyperlipidemia   . Hypertension   . Peptic ulcer disease 1999   Per medical reports, no H pylori  . Renal cancer, left ([place]) 2012   he tells me that he has been released, ?and that he is free of cancer, and never had it to begin with.   . Right shoulder pain   . Testicular hypofunction     Past Surgical History:  Procedure Laterality Date  . COLONOSCOPY  2003   Dr. [name], polyps  . COLONOSCOPY  2005   Dr. [name], multiple diverticula  . COLONOSCOPY  2008   Dr. [name], diverticulosis  . COLONOSCOPY WITH PROPOFOL N/A 10/21/2016   Dr. [name]: Diverticulosis, two 5-7 mm polyps removed. path-tubular adenomas.  Next colonoscopy in 5 years.  . ESOPHAGOGASTRODUODENOSCOPY     Multiple EGDs. 1999 EGD showed gastric ulcers, no H pylori and benign biopsies performed by Dr.  [name]. 2001 gastric ulcer healed. Last EGD 2005 had gastritis.  . Left partial mandibulectomy, Scapular free flap reconstruction, selective neck dissection, tracheotomy, and resection of intraoral palate cancer. Left 03/01/2017   [facility]  . LUNG BIOPSY    . PARTIAL NEPHRECTOMY Left 2012  . POLYPECTOMY  10/21/2016   Procedure: POLYPECTOMY;  Surgeon: [name], MD;  [facility];  Service: Endoscopy;;  hepatic flexure x2    There were no vitals filed for this visit.  Subjective Assessment - 05/04/18 1517    Subjective  PT is using his compression daily 3 hours a day.     Pertinent History  renal cancer, Cancer of Lt mandible with LT partial mandibulectomy on 03/01/2017, DM ,HTN     Currently in Pain?  Yes    Pain Score  5     Pain Location  Jaw    Pain Orientation  Left    Pain Descriptors / Indicators  Aching;Sharp    Pain Onset  More than a month ago                       [facility] Adult PT Treatment/Exercise - 05/04/18 0001  Exercises   Exercises  Neck      Neck Exercises: Seated   Other Seated Exercise  --    Other Seated Exercise  --      Neck Exercises: Supine   Other Supine Exercise  --      Manual Therapy   Manual Therapy  Myofascial release;Manual Lymphatic Drainage (MLD);Compression Bandaging    Manual therapy comments  completed seperate from all other aspects of treatment     Myofascial Release  for scar under mandibular.     Manual Lymphatic Drainage (MLD)  thoracic, cervical and facial to include lip and eye area.     Compression Bandaging  --               PT Short Term Goals - 05/04/18 1513      PT SHORT TERM GOAL #1   Title  Pt pain to be no greater than a 5/10 to allow pt to turn his head to at least 50 degrees in both directions  to assist in safety while driving    Time  3    Period  Weeks    Status  Partially Met   pain still occasionally an 8/10 but able to turn head to 50  degrees      PT SHORT TERM GOAL #2   Title  PT to be able to open his mouth 4.5 cm to make eating easier.     Time  3    Period  Weeks    Status  Revised   goal 3.0cm      PT SHORT TERM GOAL #3   Title  Pt measurements to have improved by 1 cm to allow pt to have improved self image.     Status  Achieved        PT Long Term Goals - 05/04/18 1515      PT LONG TERM GOAL #1   Title  PT to be able to turn his head 60 degrees or more in both directions to be able to see blind spot while driving    Time  6    Period  Weeks    Status  On-going      PT LONG TERM GOAL #2   Title  PT measurement to be decreased 1.5 cm to demonstrate decreased facial and cervical edema to improve swallowing.     Time  6    Period  Weeks    Status  Partially Met      PT LONG TERM GOAL #3   Title  PT to be wearing his compression for 20 mintues 2x a day at least 5 days a week to keep edema down.     Time  6    Period  Weeks    Status  Achieved      PT LONG TERM GOAL #4   Title  Pt to be able to demonstrate self massaging techniques to increase lymphatic flow to decrease swelling.     Time  6    Period  Weeks    Status  Achieved            Plan - 05/04/18 1520    Clinical Impression Statement  More time spent on stretching scar to gain skin mobility therefore exercises were not completed; pt states that he is completing them at home.  Extra time spent along submental area.     Rehab Potential  Good    PT Frequency  3x / week  PT Duration  6 weeks    PT Treatment/Interventions  ADLs/Self Care Home Management;Therapeutic exercise;Patient/family education;Manual techniques;Compression bandaging    PT Next Visit Plan   Continue decongestive techniques to decrease edema as well as  myofascial for scar release.    PT Home Exercise Plan  cervical ROM ; thoracic decongestion.     Consulted and Agree with Plan of Care  Patient       Patient will benefit from skilled therapeutic intervention in  order to improve the following deficits and impairments:  Decreased activity tolerance, Decreased scar mobility, Decreased range of motion, Decreased mobility, Pain, Postural dysfunction, Increased edema  Visit Diagnosis: Lymphedema, not elsewhere classified     Problem List Patient Active Problem List   Diagnosis Date Noted  . Attention to G-tube ([place]) 02/08/2018  . UTI (urinary tract infection) 04/20/2017  . UTI due to Klebsiella species 04/20/2017  . Essential hypertension 04/20/2017  . Chronic pancreatitis ([place]) 04/20/2017  . 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 Malnutrition of moderate degree 04/20/2017  . Complicated UTI (urinary tract infection)   . 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 Rectal bleeding 09/28/2016  . Hyponatremia 04/28/2015  . Abdominal pain 04/27/2015  . Acute pancreatitis 04/27/2015  . Chronic alcoholic pancreatitis ([place]) 04/27/2015  . ETOH abuse 04/27/2015  . DM type 2 (diabetes mellitus, type 2) ([place]) 04/27/2015  . Hypertension, uncontrolled 04/27/2015  . Hyperlipidemia 04/27/2015  . Hypertensive urgency 04/27/2015  . Alcohol abuse     Rayetta Humphrey, [place] CLT [phone] 05/04/2018, 3:23 PM  [facility] [address] [place], [place], [address] Phone: [phone]   Fax:  [phone]  Name: Colin Wagner MRN: [id number] Date of Birth: 02-07-1951

## 2018-05-08 ENCOUNTER — Ambulatory Visit (HOSPITAL_COMMUNITY): Payer: Medicare Other | Admitting: Physical Therapy

## 2018-05-08 DIAGNOSIS — I890 Lymphedema, not elsewhere classified: Secondary | ICD-10-CM | POA: Diagnosis not present

## 2018-05-08 NOTE — Therapy (Signed)
[place], [place], [address] Phone: [phone]   Fax:  [phone]  Physical Therapy Treatment  Patient Details  Name: Colin Wagner MRN: [id number] Date of Birth: 1950/08/30 Referring Provider: [name]    Encounter Date: 05/08/2018  PT End of Session - 05/08/18 1003    Visit Number  8    Number of Visits  18    Date for PT Re-Evaluation  06/02/18    Authorization - Visit Number  8    Authorization - Number of Visits  18    PT Start Time  0950    PT Stop Time  1035    PT Time Calculation (min)  45 min    Activity Tolerance  Patient tolerated treatment well    Behavior During Therapy  [facility] for tasks assessed/performed       Past Medical History:  Diagnosis Date  . Alcohol abuse   . Alcoholic pancreatitis 1115   admission  . Chronic back pain   . Chronic pancreatitis ([place])    based on ct findings 2016  . COPD (chronic obstructive pulmonary disease) ([place])   . Diabetes mellitus   . Diverticulosis   . Gastritis   . GERD (gastroesophageal reflux disease)   . Headache   . History of radiation therapy 05/12/17- 06/22/17   Left cheek and bilateral neck/ 60 Gy in 30 fractions to gross disease  . Hyperlipidemia   . Hypertension   . Peptic ulcer disease 1999   Per medical reports, no H pylori  . Renal cancer, left ([place]) 2012   he tells me that he has been released, ?and that he is free of cancer, and never had it to begin with.   . Right shoulder pain   . Testicular hypofunction     Past Surgical History:  Procedure Laterality Date  . COLONOSCOPY  2003   Dr. [name], polyps  . COLONOSCOPY  2005   Dr. [name], multiple diverticula  . COLONOSCOPY  2008   Dr. [name], diverticulosis  . COLONOSCOPY WITH PROPOFOL N/A 10/21/2016   Dr. [name]: Diverticulosis, two 5-7 mm polyps removed. path-tubular adenomas.  Next colonoscopy in 5 years.  . ESOPHAGOGASTRODUODENOSCOPY     Multiple EGDs. 1999 EGD  showed gastric ulcers, no H pylori and benign biopsies performed by Dr. [name]. 2001 gastric ulcer healed. Last EGD 2005 had gastritis.  . Left partial mandibulectomy, Scapular free flap reconstruction, selective neck dissection, tracheotomy, and resection of intraoral palate cancer. Left 03/01/2017   [facility]  . LUNG BIOPSY    . PARTIAL NEPHRECTOMY Left 2012  . POLYPECTOMY  10/21/2016   Procedure: POLYPECTOMY;  Surgeon: [name], MD;  Location[facility];  Service: Endoscopy;;  hepatic flexure x2    There were no vitals filed for this visit.  Subjective Assessment - 05/08/18 0953    Subjective  PT is having stiffness and tightness     Pertinent History  renal cancer, Cancer of Lt mandible with LT partial mandibulectomy on 03/01/2017, DM ,HTN     Currently in Pain?  Yes    Pain Score  6     Pain Location  Jaw    Pain Orientation  Left    Pain Descriptors / Indicators  Aching;Tightness    Pain Onset  More than a month ago  [facility] Adult PT Treatment/Exercise - 05/08/18 0001      Exercises   Exercises  Neck      Neck Exercises: Seated   Other Seated Exercise  bulldog up to right/ up to left x 5; swallowing exercises tongue to roof of mouth swallow; bite tongue swallow x 5 each.     Other Seated Exercise  Long low "eee"; long high "eee"      Neck Exercises: Supine   Other Supine Exercise  Shaker exercises x 15 seconds 5 reps;       Manual Therapy   Manual Therapy  Myofascial release;Manual Lymphatic Drainage (MLD);Compression Bandaging    Manual therapy comments  completed seperate from all other aspects of treatment     Myofascial Release  for scar under mandibular.     Manual Lymphatic Drainage (MLD)  thoracic, cervical and facial to include lip and eye area.     Compression Bandaging  pt given facial compression made by therapist instructed to attempt to wear for 3 hours.                PT  Short Term Goals - 05/04/18 1513      PT SHORT TERM GOAL #1   Title  Pt pain to be no greater than a 5/10 to allow pt to turn his head to at least 50 degrees in both directions  to assist in safety while driving    Time  3    Period  Weeks    Status  Partially Met   pain still occasionally an 8/10 but able to turn head to 50 degrees      PT SHORT TERM GOAL #2   Title  PT to be able to open his mouth 4.5 cm to make eating easier.     Time  3    Period  Weeks    Status  Revised   goal 3.0cm      PT SHORT TERM GOAL #3   Title  Pt measurements to have improved by 1 cm to allow pt to have improved self image.     Status  Achieved        PT Long Term Goals - 05/04/18 1515      PT LONG TERM GOAL #1   Title  PT to be able to turn his head 60 degrees or more in both directions to be able to see blind spot while driving    Time  6    Period  Weeks    Status  On-going      PT LONG TERM GOAL #2   Title  PT measurement to be decreased 1.5 cm to demonstrate decreased facial and cervical edema to improve swallowing.     Time  6    Period  Weeks    Status  Partially Met      PT LONG TERM GOAL #3   Title  PT to be wearing his compression for 20 mintues 2x a day at least 5 days a week to keep edema down.     Time  6    Period  Weeks    Status  Achieved      PT LONG TERM GOAL #4   Title  Pt to be able to demonstrate self massaging techniques to increase lymphatic flow to decrease swelling.     Time  6    Period  Weeks    Status  Achieved  Plan - 05/08/18 1034    Clinical Impression Statement  Pt having an endarcaradectomy in October.  Extra manual 100 strokes completed B sub mental.  Pt skin continues to be adherent in this area as well as jawline which impedes fluid flow.      Rehab Potential  Good    PT Frequency  3x / week    PT Duration  6 weeks    PT Treatment/Interventions  ADLs/Self Care Home Management;Therapeutic exercise;Patient/family education;Manual  techniques;Compression bandaging    PT Next Visit Plan   Continue decongestive techniques to decrease edema as well as  myofascial for scar release.    PT Home Exercise Plan  cervical ROM ; thoracic decongestion.     Consulted and Agree with Plan of Care  Patient       Patient will benefit from skilled therapeutic intervention in order to improve the following deficits and impairments:  Decreased activity tolerance, Decreased scar mobility, Decreased range of motion, Decreased mobility, Pain, Postural dysfunction, Increased edema  Visit Diagnosis: Lymphedema, not elsewhere classified     Problem List Patient Active Problem List   Diagnosis Date Noted  . Attention to G-tube ([place]) 02/08/2018  . UTI (urinary tract infection) 04/20/2017  . UTI due to Klebsiella species 04/20/2017  . Essential hypertension 04/20/2017  . Chronic pancreatitis ([place]) 04/20/2017  . 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 Malnutrition of moderate degree 04/20/2017  . Complicated UTI (urinary tract infection)   . 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 Rectal bleeding 09/28/2016  . Hyponatremia 04/28/2015  . Abdominal pain 04/27/2015  . Acute pancreatitis 04/27/2015  . Chronic alcoholic pancreatitis ([place]) 04/27/2015  . ETOH abuse 04/27/2015  . DM type 2 (diabetes mellitus, type 2) ([place]) 04/27/2015  . Hypertension, uncontrolled 04/27/2015  . Hyperlipidemia 04/27/2015  . Hypertensive urgency 04/27/2015  . Alcohol abuse     Rayetta Humphrey, [place] CLT [phone] 05/08/2018, 10:38 AM  [place], [place], [address] Phone: [phone]   Fax:  [phone]  Name: KHUP SAPIA MRN: [id number] Date of Birth: 1951-01-31

## 2018-05-10 ENCOUNTER — Ambulatory Visit (HOSPITAL_COMMUNITY): Payer: Medicare Other | Admitting: Physical Therapy

## 2018-05-10 DIAGNOSIS — I890 Lymphedema, not elsewhere classified: Secondary | ICD-10-CM

## 2018-05-10 NOTE — Therapy (Signed)
[place], [place], [address] Phone: [phone]   Fax:  [phone]  Physical Therapy Treatment  Patient Details  Name: Colin Wagner MRN: [id number] Date of Birth: 1950-09-14 Referring Provider: [name]    Encounter Date: 05/10/2018  PT End of Session - 05/10/18 1344    Visit Number  9    Number of Visits  18    Date for PT Re-Evaluation  06/02/18    Authorization - Visit Number  9    Authorization - Number of Visits  18    PT Start Time  0539    PT Stop Time  1343    PT Time Calculation (min)  41 min    Activity Tolerance  Patient tolerated treatment well    Behavior During Therapy  [facility] for tasks assessed/performed       Past Medical History:  Diagnosis Date  . Alcohol abuse   . Alcoholic pancreatitis 7673   admission  . Chronic back pain   . Chronic pancreatitis ([place])    based on ct findings 2016  . COPD (chronic obstructive pulmonary disease) ([place])   . Diabetes mellitus   . Diverticulosis   . Gastritis   . GERD (gastroesophageal reflux disease)   . Headache   . History of radiation therapy 05/12/17- 06/22/17   Left cheek and bilateral neck/ 60 Gy in 30 fractions to gross disease  . Hyperlipidemia   . Hypertension   . Peptic ulcer disease 1999   Per medical reports, no H pylori  . Renal cancer, left ([place]) 2012   he tells me that he has been released, ?and that he is free of cancer, and never had it to begin with.   . Right shoulder pain   . Testicular hypofunction     Past Surgical History:  Procedure Laterality Date  . COLONOSCOPY  2003   Dr. [name], polyps  . COLONOSCOPY  2005   Dr. [name], multiple diverticula  . COLONOSCOPY  2008   Dr. [name], diverticulosis  . COLONOSCOPY WITH PROPOFOL N/A 10/21/2016   Dr. [name]: Diverticulosis, two 5-7 mm polyps removed. path-tubular adenomas.  Next colonoscopy in 5 years.  . ESOPHAGOGASTRODUODENOSCOPY     Multiple EGDs. 1999 EGD  showed gastric ulcers, no H pylori and benign biopsies performed by Dr. [name]. 2001 gastric ulcer healed. Last EGD 2005 had gastritis.  . Left partial mandibulectomy, Scapular free flap reconstruction, selective neck dissection, tracheotomy, and resection of intraoral palate cancer. Left 03/01/2017   [facility]  . LUNG BIOPSY    . PARTIAL NEPHRECTOMY Left 2012  . POLYPECTOMY  10/21/2016   Procedure: POLYPECTOMY;  Surgeon: [name], MD;  Location[facility];  Service: Endoscopy;;  hepatic flexure x2    There were no vitals filed for this visit.  Subjective Assessment - 05/10/18 1301    Subjective  Pt continues to complete his exercises once a day and wears his compression for 3 hrs/day.  PT states that his pain still goes as high as a 8/10.   He is able to swallow and move his head better.  He is going to see an ENT tomorrow.     Pertinent History  renal cancer, Cancer of Lt mandible with LT partial mandibulectomy on 03/01/2017, DM ,HTN     Currently in Pain?  Yes    Pain Score  4     Pain Location  Jaw  Pain Orientation  Left    Pain Descriptors / Indicators  Aching;Tightness    Pain Onset  More than a month ago                       [facility] Adult PT Treatment/Exercise - 05/10/18 0001      Exercises   Exercises  Neck      Neck Exercises: Seated   Other Seated Exercise  bulldog up to right/ up to left x 5; swallowing exercises tongue to roof of mouth swallow; bite tongue swallow x 5 each.     Other Seated Exercise  cervical rotation B;Long low "eee"; long high "eee"      Neck Exercises: Supine   Other Supine Exercise  Shaker exercises x 15 seconds 5 reps;       Manual Therapy   Manual Therapy  Myofascial release;Manual Lymphatic Drainage (MLD);Compression Bandaging    Manual therapy comments  completed seperate from all other aspects of treatment     Myofascial Release  for scar under mandibular.     Manual Lymphatic Drainage  (MLD)  thoracic, cervical and facial to include lip and eye area.     Compression Bandaging  pt given new submentalcompression made by therapist instructed to attempt to wear for 3 hours.                PT Short Term Goals - 05/04/18 1513      PT SHORT TERM GOAL #1   Title  Pt pain to be no greater than a 5/10 to allow pt to turn his head to at least 50 degrees in both directions  to assist in safety while driving    Time  3    Period  Weeks    Status  Partially Met   pain still occasionally an 8/10 but able to turn head to 50 degrees      PT SHORT TERM GOAL #2   Title  PT to be able to open his mouth 4.5 cm to make eating easier.     Time  3    Period  Weeks    Status  Revised   goal 3.0cm      PT SHORT TERM GOAL #3   Title  Pt measurements to have improved by 1 cm to allow pt to have improved self image.     Status  Achieved        PT Long Term Goals - 05/04/18 1515      PT LONG TERM GOAL #1   Title  PT to be able to turn his head 60 degrees or more in both directions to be able to see blind spot while driving    Time  6    Period  Weeks    Status  On-going      PT LONG TERM GOAL #2   Title  PT measurement to be decreased 1.5 cm to demonstrate decreased facial and cervical edema to improve swallowing.     Time  6    Period  Weeks    Status  Partially Met      PT LONG TERM GOAL #3   Title  PT to be wearing his compression for 20 mintues 2x a day at least 5 days a week to keep edema down.     Time  6    Period  Weeks    Status  Achieved      PT LONG TERM GOAL #4  Title  Pt to be able to demonstrate self massaging techniques to increase lymphatic flow to decrease swelling.     Time  6    Period  Weeks    Status  Achieved            Plan - 05/10/18 1345    Clinical Impression Statement  Continue to work on submental and jawline edema using extra strokes.  Increased myofascial completed in this area as well.  Pt high pain is not as frequent but  continues to be high when he has it.      Rehab Potential  Good    PT Frequency  3x / week    PT Duration  6 weeks    PT Treatment/Interventions  ADLs/Self Care Home Management;Therapeutic exercise;Patient/family education;Manual techniques;Compression bandaging    PT Next Visit Plan  Measure for 10th visit progress note next session.    PT Home Exercise Plan  cervical ROM ; thoracic decongestion.     Consulted and Agree with Plan of Care  Patient       Patient will benefit from skilled therapeutic intervention in order to improve the following deficits and impairments:  Decreased activity tolerance, Decreased scar mobility, Decreased range of motion, Decreased mobility, Pain, Postural dysfunction, Increased edema  Visit Diagnosis: Lymphedema, not elsewhere classified     Problem List Patient Active Problem List   Diagnosis Date Noted  . Attention to G-tube ([place]) 02/08/2018  . UTI (urinary tract infection) 04/20/2017  . UTI due to Klebsiella species 04/20/2017  . Essential hypertension 04/20/2017  . Chronic pancreatitis ([place]) 04/20/2017  . 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 Malnutrition of moderate degree 04/20/2017  . Complicated UTI (urinary tract infection)   . 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 Rectal bleeding 09/28/2016  . Hyponatremia 04/28/2015  . Abdominal pain 04/27/2015  . Acute pancreatitis 04/27/2015  . Chronic alcoholic pancreatitis ([place]) 04/27/2015  . ETOH abuse 04/27/2015  . DM type 2 (diabetes mellitus, type 2) ([place]) 04/27/2015  . Hypertension, uncontrolled 04/27/2015  . Hyperlipidemia 04/27/2015  . Hypertensive urgency 04/27/2015  . Alcohol abuse     Rayetta Humphrey, [place] CLT [phone] 05/10/2018, 1:48 PM  [facility] [address] [place], [place], [address] Phone: [phone]   Fax:  [phone]  Name: Colin Wagner MRN: [id number] Date of Birth: December 10, 1950

## 2018-05-11 ENCOUNTER — Ambulatory Visit (INDEPENDENT_AMBULATORY_CARE_PROVIDER_SITE_OTHER): Payer: Medicare Other | Admitting: Otolaryngology

## 2018-05-11 ENCOUNTER — Ambulatory Visit (HOSPITAL_COMMUNITY): Payer: Medicare Other | Admitting: Physical Therapy

## 2018-05-11 ENCOUNTER — Telehealth (HOSPITAL_COMMUNITY): Payer: Self-pay | Admitting: Family Medicine

## 2018-05-11 DIAGNOSIS — R070 Pain in throat: Secondary | ICD-10-CM | POA: Diagnosis not present

## 2018-05-11 DIAGNOSIS — R1312 Dysphagia, oropharyngeal phase: Secondary | ICD-10-CM

## 2018-05-11 DIAGNOSIS — Z85818 Personal history of malignant neoplasm of other sites of lip, oral cavity, and pharynx: Secondary | ICD-10-CM

## 2018-05-11 DIAGNOSIS — I890 Lymphedema, not elsewhere classified: Secondary | ICD-10-CM

## 2018-05-11 NOTE — Telephone Encounter (Signed)
05/11/18  pt said he would be having a procedure in [place] and would have to stay overnight

## 2018-05-11 NOTE — Therapy (Signed)
[facility] [address] [place], [place], [address] Phone: [phone]   Fax:  [phone]  Physical Therapy Treatment  Patient Details  Name: Colin Wagner MRN: [id number] Date of Birth: Jul 06, 1951 Referring Provider: [name]    Encounter Date: 05/11/2018   Progress Note Reporting Period 04/21/2018 to 05/11/2018  See note below for Objective Data and Assessment of Progress/Goals.      PT End of Session - 05/11/18 1130    Visit Number  10    Number of Visits  18    Date for PT Re-Evaluation  06/02/18    Authorization - Visit Number  10    Authorization - Number of Visits  18    PT Start Time  4010    PT Stop Time  1120    PT Time Calculation (min)  45 min    Activity Tolerance  Patient tolerated treatment well    Behavior During Therapy  WFL for tasks assessed/performed       Past Medical History:  Diagnosis Date  . Alcohol abuse   . Alcoholic pancreatitis 2725   admission  . Chronic back pain   . Chronic pancreatitis ([place])    based on ct findings 2016  . COPD (chronic obstructive pulmonary disease) ([place])   . Diabetes mellitus   . Diverticulosis   . Gastritis   . GERD (gastroesophageal reflux disease)   . Headache   . History of radiation therapy 05/12/17- 06/22/17   Left cheek and bilateral neck/ 60 Gy in 30 fractions to gross disease  . Hyperlipidemia   . Hypertension   . Peptic ulcer disease 1999   Per medical reports, no H pylori  . Renal cancer, left ([place]) 2012   he tells me that he has been released, ?and that he is free of cancer, and never had it to begin with.   . Right shoulder pain   . Testicular hypofunction     Past Surgical History:  Procedure Laterality Date  . COLONOSCOPY  2003   Dr. [name], polyps  . COLONOSCOPY  2005   Dr. [name], multiple diverticula  . COLONOSCOPY  2008   Dr. [name], diverticulosis  . COLONOSCOPY WITH PROPOFOL N/A 10/21/2016   Dr. [name]: Diverticulosis,  two 5-7 mm polyps removed. path-tubular adenomas.  Next colonoscopy in 5 years.  . ESOPHAGOGASTRODUODENOSCOPY     Multiple EGDs. 1999 EGD showed gastric ulcers, no H pylori and benign biopsies performed by Dr. [name]. 2001 gastric ulcer healed. Last EGD 2005 had gastritis.  . Left partial mandibulectomy, Scapular free flap reconstruction, selective neck dissection, tracheotomy, and resection of intraoral palate cancer. Left 03/01/2017   [facility]  . LUNG BIOPSY    . PARTIAL NEPHRECTOMY Left 2012  . POLYPECTOMY  10/21/2016   Procedure: POLYPECTOMY;  Surgeon: [name], MD;  Location: [facility];  Service: Endoscopy;;  hepatic flexure x2    There were no vitals filed for this visit.      [facility] PT Assessment - 05/11/18 0001      Assessment   Medical Diagnosis  cervical lymphedema    Referring Provider  [name]     Onset Date/Surgical Date  01/21/18    Prior Therapy  none for this issue       Precautions   Precautions  None      Restrictions   Weight Bearing Restrictions  No      Home Environment  Living Environment  Private residence      Prior Function   Level of Independence  Independent      Cognition   Overall Cognitive Status  Within Functional Limits for tasks assessed      Observation/Other Assessments   Focus on Therapeutic Outcomes (FOTO)   --      AROM   Cervical Flexion  50   was 40   Cervical Extension  44   was 35    Cervical - Right Side Bend  22   was 20   Cervical - Left Side Bend  32   was 16   Cervical - Right Rotation  50   was 24    Cervical - Left Rotation  50   was 38        LYMPHEDEMA/ONCOLOGY QUESTIONNAIRE - 05/11/18 1039      Head and Neck   Right Lateral Nostril at base of nose to medial tragus   10.3 cm   was 11.3   Left Lateral Nostril at base of nose to medial tragus   11 cm   was 11   Right Corner of mouth to where ear lobe meets face  9.8 cm   was 10.4   Left Corner of mouth to  where ear lobe meets face  11.2 cm   was 12   4 cm superior to sternal notch around neck  40.5 cm   was40.5   6 cm superior to sternal notch around neck  40 cm   was  40   8 cm superior to sternal notch around neck  42.2 cm   was 42.1               [facility] Adult PT Treatment/Exercise - 05/11/18 0001      Exercises   Exercises  Neck      Neck Exercises: Seated   Other Seated Exercise  --    Other Seated Exercise  --      Neck Exercises: Supine   Other Supine Exercise  --      Manual Therapy   Manual Therapy  Myofascial release;Manual Lymphatic Drainage (MLD);Compression Bandaging    Manual therapy comments  completed seperate from all other aspects of treatment     Myofascial Release  for scar under mandibular.     Manual Lymphatic Drainage (MLD)  thoracic, cervical and facial to include lip and eye area.     Compression Bandaging  measurement.                PT Short Term Goals - 05/04/18 1513      PT SHORT TERM GOAL #1   Title  Pt pain to be no greater than a 5/10 to allow pt to turn his head to at least 50 degrees in both directions  to assist in safety while driving    Time  3    Period  Weeks    Status  Partially Met   pain still occasionally an 8/10 but able to turn head to 50 degrees      PT SHORT TERM GOAL #2   Title  PT to be able to open his mouth 4.5 cm to make eating easier.     Time  3    Period  Weeks    Status  Revised   goal 3.0cm      PT SHORT TERM GOAL #3   Title  Pt measurements to have improved by 1 cm to allow pt  to have improved self image.     Status  Achieved        PT Long Term Goals - 05/04/18 1515      PT LONG TERM GOAL #1   Title  PT to be able to turn his head 60 degrees or more in both directions to be able to see blind spot while driving    Time  6    Period  Weeks    Status  On-going      PT LONG TERM GOAL #2   Title  PT measurement to be decreased 1.5 cm to demonstrate decreased facial and cervical edema to  improve swallowing.     Time  6    Period  Weeks    Status  Partially Met      PT LONG TERM GOAL #3   Title  PT to be wearing his compression for 20 mintues 2x a day at least 5 days a week to keep edema down.     Time  6    Period  Weeks    Status  Achieved      PT LONG TERM GOAL #4   Title  Pt to be able to demonstrate self massaging techniques to increase lymphatic flow to decrease swelling.     Time  6    Period  Weeks    Status  Achieved            Plan - 05/11/18 1131    Clinical Impression Statement  Pt remeasured for 10th visit progress report with improved ROM, slight decrease edema and improved ability to swallow.  PT will be having an endarcardectomty in [place] on the 9th of October.  We will continue to see pt for the next two weeks but then he will need to be placed on hold until he is released for treatment from his cardiovascular surgeon.  Pt verbalizes understanding.     History and Personal Factors relevant to plan of care:  cancer, manibulectomy on LT, lymphedema     Rehab Potential  Good    PT Frequency  3x / week    PT Duration  6 weeks    PT Treatment/Interventions  ADLs/Self Care Home Management;Therapeutic exercise;Patient/family education;Manual techniques;Compression bandaging    PT Next Visit Plan  Continue manual techniques to improve scar movement which will improve lymph circulation.     PT Home Exercise Plan  cervical ROM ; thoracic decongestion.     Consulted and Agree with Plan of Care  Patient       Patient will benefit from skilled therapeutic intervention in order to improve the following deficits and impairments:  Decreased activity tolerance, Decreased scar mobility, Decreased range of motion, Decreased mobility, Pain, Postural dysfunction, Increased edema  Visit Diagnosis: Lymphedema, not elsewhere classified     Problem List Patient Active Problem List   Diagnosis Date Noted  . Attention to G-tube ([place]) 02/08/2018  . UTI  (urinary tract infection) 04/20/2017  . UTI due to Klebsiella species 04/20/2017  . Essential hypertension 04/20/2017  . Chronic pancreatitis ([place]) 04/20/2017  . 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 Malnutrition of moderate degree 04/20/2017  . Complicated UTI (urinary tract infection)   . 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 Rectal bleeding 09/28/2016  . Hyponatremia 04/28/2015  . Abdominal pain 04/27/2015  . Acute pancreatitis 04/27/2015  . Chronic alcoholic pancreatitis ([place]) 04/27/2015  . ETOH abuse 04/27/2015  . DM type 2 (diabetes mellitus,  type 2) ([place]) 04/27/2015  . Hypertension, uncontrolled 04/27/2015  . Hyperlipidemia 04/27/2015  . Hypertensive urgency 04/27/2015  . Alcohol abuse     Rayetta Humphrey, [place] CLT [phone] 05/11/2018, 11:35 AM  [place], [place], [address] Phone: [phone]   Fax:  [phone]  Name: Colin Wagner MRN: [id number] Date of Birth: 31-Mar-1951

## 2018-05-12 ENCOUNTER — Encounter

## 2018-05-15 ENCOUNTER — Encounter (HOSPITAL_COMMUNITY): Payer: Self-pay | Admitting: Emergency Medicine

## 2018-05-15 ENCOUNTER — Emergency Department (HOSPITAL_COMMUNITY)
Admission: EM | Admit: 2018-05-15 | Discharge: 2018-05-15 | Disposition: A | Discharge status: Home / Self Care | Payer: Medicare Other | Attending: Emergency Medicine | Admitting: Emergency Medicine

## 2018-05-15 ENCOUNTER — Ambulatory Visit (HOSPITAL_COMMUNITY): Payer: Medicare Other | Admitting: Physical Therapy

## 2018-05-15 ENCOUNTER — Emergency Department (HOSPITAL_COMMUNITY): Payer: Medicare Other

## 2018-05-15 DIAGNOSIS — Z7984 Long term (current) use of oral hypoglycemic drugs: Secondary | ICD-10-CM | POA: Insufficient documentation

## 2018-05-15 DIAGNOSIS — E119 Type 2 diabetes mellitus without complications: Secondary | ICD-10-CM | POA: Diagnosis not present

## 2018-05-15 DIAGNOSIS — Z79899 Other long term (current) drug therapy: Secondary | ICD-10-CM | POA: Insufficient documentation

## 2018-05-15 DIAGNOSIS — Z85828 Personal history of other malignant neoplasm of skin: Secondary | ICD-10-CM | POA: Insufficient documentation

## 2018-05-15 DIAGNOSIS — I890 Lymphedema, not elsewhere classified: Secondary | ICD-10-CM

## 2018-05-15 DIAGNOSIS — K9423 Gastrostomy malfunction: Secondary | ICD-10-CM | POA: Diagnosis present

## 2018-05-15 DIAGNOSIS — Z87891 Personal history of nicotine dependence: Secondary | ICD-10-CM | POA: Insufficient documentation

## 2018-05-15 DIAGNOSIS — Z85528 Personal history of other malignant neoplasm of kidney: Secondary | ICD-10-CM | POA: Diagnosis not present

## 2018-05-15 DIAGNOSIS — F1010 Alcohol abuse, uncomplicated: Secondary | ICD-10-CM | POA: Diagnosis not present

## 2018-05-15 DIAGNOSIS — J449 Chronic obstructive pulmonary disease, unspecified: Secondary | ICD-10-CM | POA: Insufficient documentation

## 2018-05-15 DIAGNOSIS — I10 Essential (primary) hypertension: Secondary | ICD-10-CM | POA: Insufficient documentation

## 2018-05-15 MED ORDER — IOPAMIDOL (ISOVUE-300) INJECTION 61%
INTRAVENOUS | Status: AC
  Filled 2018-05-15: qty 50

## 2018-05-15 MED ORDER — IOPAMIDOL (ISOVUE-300) INJECTION 61%
50.0000 mL | Freq: Once | INTRAVENOUS | Status: AC | PRN
  Administered 2018-05-15: 50 mL

## 2018-05-15 NOTE — Telephone Encounter (Signed)
Paged RMR. RMR called back. Pt was advised to see [facility] since they originally placed feeding tube per RMR. Pt said they started coming to our office due to them not knowing who the doctor that placed it is. They know the ordering doctor and they come to our office for management.

## 2018-05-15 NOTE — Telephone Encounter (Signed)
Please touch base with Dr. [name] regarding patient. I'm not sure what to make of the "new gurgling" noise, bloody discharge.   He may need to check it over in endo today.   We did not place the original tube, I believe it was done by IR at [place] last one replaced by ED physician at [facility]

## 2018-05-15 NOTE — Telephone Encounter (Signed)
Routing to RMR per LSL

## 2018-05-15 NOTE — Therapy (Signed)
[place], [place], [address] Phone: [phone]   Fax:  [phone]  Physical Therapy Treatment  Patient Details  Name: Colin Wagner MRN: [id number] Date of Birth: 1950-09-26 Referring Provider: [name]    Encounter Date: 05/15/2018  PT End of Session - 05/15/18 1033    Visit Number  11    Number of Visits  18    Date for PT Re-Evaluation  06/02/18    Authorization - Visit Number  11    Authorization - Number of Visits  18    PT Start Time  7989    PT Stop Time  1032    PT Time Calculation (min)  44 min    Activity Tolerance  Patient tolerated treatment well    Behavior During Therapy  [facility] for tasks assessed/performed       Past Medical History:  Diagnosis Date  . Alcohol abuse   . Alcoholic pancreatitis 2119   admission  . Chronic back pain   . Chronic pancreatitis ([place])    based on ct findings 2016  . COPD (chronic obstructive pulmonary disease) ([place])   . Diabetes mellitus   . Diverticulosis   . Gastritis   . GERD (gastroesophageal reflux disease)   . Headache   . History of radiation therapy 05/12/17- 06/22/17   Left cheek and bilateral neck/ 60 Gy in 30 fractions to gross disease  . Hyperlipidemia   . Hypertension   . Peptic ulcer disease 1999   Per medical reports, no H pylori  . Renal cancer, left ([place]) 2012   he tells me that he has been released, ?and that he is free of cancer, and never had it to begin with.   . Right shoulder pain   . Testicular hypofunction     Past Surgical History:  Procedure Laterality Date  . COLONOSCOPY  2003   Dr. [name], polyps  . COLONOSCOPY  2005   Dr. [name], multiple diverticula  . COLONOSCOPY  2008   Dr. [name], diverticulosis  . COLONOSCOPY WITH PROPOFOL N/A 10/21/2016   Dr. [name]: Diverticulosis, two 5-7 mm polyps removed. path-tubular adenomas.  Next colonoscopy in 5 years.  . ESOPHAGOGASTRODUODENOSCOPY     Multiple EGDs. 1999  EGD showed gastric ulcers, no H pylori and benign biopsies performed by Dr. [name]. 2001 gastric ulcer healed. Last EGD 2005 had gastritis.  . Left partial mandibulectomy, Scapular free flap reconstruction, selective neck dissection, tracheotomy, and resection of intraoral palate cancer. Left 03/01/2017   [facility]  . LUNG BIOPSY    . PARTIAL NEPHRECTOMY Left 2012  . POLYPECTOMY  10/21/2016   Procedure: POLYPECTOMY;  Surgeon: [name], MD;  Location: [facility] ENDO [facility];  Service: Endoscopy;;  hepatic flexure x2    There were no vitals filed for this visit.  Subjective Assessment - 05/15/18 0950    Subjective  Pt voices no complaint. He has been doing his exercises overthe weekend     Pertinent History  renal cancer, Cancer of Lt mandible with LT partial mandibulectomy on 03/01/2017, DM ,HTN     Currently in Pain?  No/denies    Pain Onset  More than a month ago                       [facility] Adult PT Treatment/Exercise - 05/15/18 0001      Exercises   Exercises  Neck  Neck Exercises: Seated   Other Seated Exercise  bulldog up to right/ up to left x 5; swallowing exercises tongue to roof of mouth swallow; bite tongue swallow x 5 each.     Other Seated Exercise  cervical rotation B;Long low "eee"; long high "eee"      Manual Therapy   Manual Therapy  Myofascial release;Manual Lymphatic Drainage (MLD);Compression Bandaging    Manual therapy comments  completed seperate from all other aspects of treatment     Myofascial Release  for scar under mandibular.     Manual Lymphatic Drainage (MLD)  thoracic, cervical and facial to include lip and eye area.     Compression Bandaging  --               PT Short Term Goals - 05/04/18 1513      PT SHORT TERM GOAL #1   Title  Pt pain to be no greater than a 5/10 to allow pt to turn his head to at least 50 degrees in both directions  to assist in safety while driving    Time  3    Period   Weeks    Status  Partially Met   pain still occasionally an 8/10 but able to turn head to 50 degrees      PT SHORT TERM GOAL #2   Title  PT to be able to open his mouth 4.5 cm to make eating easier.     Time  3    Period  Weeks    Status  Revised   goal 3.0cm      PT SHORT TERM GOAL #3   Title  Pt measurements to have improved by 1 cm to allow pt to have improved self image.     Status  Achieved        PT Long Term Goals - 05/04/18 1515      PT LONG TERM GOAL #1   Title  PT to be able to turn his head 60 degrees or more in both directions to be able to see blind spot while driving    Time  6    Period  Weeks    Status  On-going      PT LONG TERM GOAL #2   Title  PT measurement to be decreased 1.5 cm to demonstrate decreased facial and cervical edema to improve swallowing.     Time  6    Period  Weeks    Status  Partially Met      PT LONG TERM GOAL #3   Title  PT to be wearing his compression for 20 mintues 2x a day at least 5 days a week to keep edema down.     Time  6    Period  Weeks    Status  Achieved      PT LONG TERM GOAL #4   Title  Pt to be able to demonstrate self massaging techniques to increase lymphatic flow to decrease swelling.     Time  6    Period  Weeks    Status  Achieved            Plan - 05/15/18 1033    Clinical Impression Statement  Pt completed exercises with improved motion.  Pt continues to have significant submental swelling with scaring interfering with completed decongestion of this area.  Myofascial techniques used to attempt to decrease scarring.     Rehab Potential  Good    PT Frequency  3x / week    PT Duration  6 weeks    PT Treatment/Interventions  ADLs/Self Care Home Management;Therapeutic exercise;Patient/family education;Manual techniques;Compression bandaging    PT Next Visit Plan  Continue manual techniques to improve scar movement which will improve lymph circulation.     PT Home Exercise Plan  cervical ROM ; thoracic  decongestion.     Consulted and Agree with Plan of Care  Patient       Patient will benefit from skilled therapeutic intervention in order to improve the following deficits and impairments:  Decreased activity tolerance, Decreased scar mobility, Decreased range of motion, Decreased mobility, Pain, Postural dysfunction, Increased edema  Visit Diagnosis: Lymphedema, not elsewhere classified     Problem List Patient Active Problem List   Diagnosis Date Noted  . Attention to G-tube ([place]) 02/08/2018  . UTI (urinary tract infection) 04/20/2017  . UTI due to Klebsiella species 04/20/2017  . Essential hypertension 04/20/2017  . Chronic pancreatitis ([place]) 04/20/2017  . 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 Malnutrition of moderate degree 04/20/2017  . Complicated UTI (urinary tract infection)   . 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 Rectal bleeding 09/28/2016  . Hyponatremia 04/28/2015  . Abdominal pain 04/27/2015  . Acute pancreatitis 04/27/2015  . 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 ETOH abuse 04/27/2015  . DM type 2 (diabetes mellitus, type 2) ([place]) 04/27/2015  . Hypertension, uncontrolled 04/27/2015  . Hyperlipidemia 04/27/2015  . Hypertensive urgency 04/27/2015  . Alcohol abuse     Rayetta Humphrey, [place] CLT [phone] 05/15/2018, 10:36 AM  [place], [place], [address] Phone: [phone]   Fax:  [phone]  Name: GEFFREY MICHAELSEN MRN: [id number] Date of Birth: 1951-04-09

## 2018-05-15 NOTE — ED Triage Notes (Signed)
Pt states his feeding tube has a little dried blood around it noticed yesterday, it seems to be sticking out more than usual, and it has a gurgling sound when feeding hooked to it.

## 2018-05-15 NOTE — Telephone Encounter (Signed)
Routing to Alicia  

## 2018-05-15 NOTE — Telephone Encounter (Signed)
Pts girlfriend called for pt with c/o a bloody discharge around the peg site. No tenderness felt or redness that was present when seen last. The tube is protruding off from the stomach and it has done that before per pt. When the food is placed in the tube, it makes a gurgling noise that hasn't been heard before.

## 2018-05-15 NOTE — Discharge Instructions (Addendum)
It is safe to use your tube tonight for your supplement but you will need to have this replaced. Call the number listed above in the morning to get this scheduled for tomorrow.  You will come here to the radiology department to have this done.

## 2018-05-15 NOTE — Telephone Encounter (Signed)
Spoke with pts significant other and she is aware that we will contact pt tomorrow once our we discuss further with RMR. Pt will probably go to the ED today per pts significant other.

## 2018-05-16 ENCOUNTER — Encounter (HOSPITAL_COMMUNITY): Payer: Self-pay | Admitting: Interventional Radiology

## 2018-05-16 ENCOUNTER — Other Ambulatory Visit (HOSPITAL_COMMUNITY): Payer: Self-pay | Admitting: Emergency Medicine

## 2018-05-16 ENCOUNTER — Telehealth: Payer: Self-pay

## 2018-05-16 ENCOUNTER — Ambulatory Visit (HOSPITAL_COMMUNITY)
Admission: RE | Admit: 2018-05-16 | Discharge: 2018-05-16 | Disposition: A | Discharge status: Home / Self Care | Payer: Medicare Other | Source: Ambulatory Visit | Attending: Emergency Medicine | Admitting: Emergency Medicine

## 2018-05-16 DIAGNOSIS — R633 Feeding difficulties, unspecified: Secondary | ICD-10-CM

## 2018-05-16 DIAGNOSIS — Y733 Surgical instruments, materials and gastroenterology and urology devices (including sutures) associated with adverse incidents: Secondary | ICD-10-CM | POA: Diagnosis not present

## 2018-05-16 DIAGNOSIS — K9423 Gastrostomy malfunction: Secondary | ICD-10-CM | POA: Diagnosis not present

## 2018-05-16 DIAGNOSIS — R1310 Dysphagia, unspecified: Secondary | ICD-10-CM | POA: Diagnosis not present

## 2018-05-16 HISTORY — PX: IR REPLC GASTRO/COLONIC TUBE PERCUT W/FLUORO: IMG2333

## 2018-05-16 MED ORDER — LIDOCAINE VISCOUS HCL 2 % MT SOLN
OROMUCOSAL | Status: AC
  Administered 2018-05-16: 5 mL
  Filled 2018-05-16: qty 15

## 2018-05-16 MED ORDER — IOPAMIDOL (ISOVUE-300) INJECTION 61%
INTRAVENOUS | Status: AC
  Administered 2018-05-16: 20 mL
  Filled 2018-05-16: qty 50

## 2018-05-16 MED ORDER — IOPAMIDOL (ISOVUE-300) INJECTION 61%
50.0000 mL | Freq: Once | INTRAVENOUS | Status: AC | PRN
  Administered 2018-05-16: 20 mL

## 2018-05-16 NOTE — Procedures (Signed)
Pre procedural Dx: Dysphagia, poorly functioning feeding tube. Post procedural Dx: Same  Successful fluoroscopic guided replacement of exisitng 18 Fr gastrostomy tube.   The feeding tube is ready for immediate use.  EBL: None  Complications: None immediate.  Ronny Bacon, MD Pager #: [phone]

## 2018-05-16 NOTE — Telephone Encounter (Signed)
Reviewed. Thanks for your help with this [name]!

## 2018-05-16 NOTE — ED Provider Notes (Signed)
[facility] Provider Note   CSN: [id number] Arrival date & time: 05/15/18  1559     History   Chief Complaint Chief Complaint  Patient presents with  . feeding tube problem    HPI Colin Wagner is a 67 y.o. male with history as outlined below, most pertinent to g tube placement one year ago secondary to mandible cancer with partial surgical resection.  Although he can take some soft foods by mouth he relies on his g tube for tid supplemental feeding.  He woke yesterday to find some dried blood around the edges of the ostomy site and the tube is sticking out further than normal and he cannot push it back into place.  He reports mild discomfort around the site.  He has used it for feeding, last was this am at 7 am and noticed a gurgling sound which is new.  He denies n/v, fevers and no significant abdominal pain or distention.  He wants a new g tube placed.  The history is provided by the patient.    Past Medical History:  Diagnosis Date  . Alcohol abuse   . Alcoholic pancreatitis 3474   admission  . Chronic back pain   . Chronic pancreatitis ([place])    based on ct findings 2016  . COPD (chronic obstructive pulmonary disease) ([place])   . Diabetes mellitus   . Diverticulosis   . Gastritis   . GERD (gastroesophageal reflux disease)   . Headache   . History of radiation therapy 05/12/17- 06/22/17   Left cheek and bilateral neck/ 60 Gy in 30 fractions to gross disease  . Hyperlipidemia   . Hypertension   . Peptic ulcer disease 1999   Per medical reports, no H pylori  . Renal cancer, left ([place]) 2012   he tells me that he has been released, ?and that he is free of cancer, and never had it to begin with.   . Right shoulder pain   . Testicular hypofunction     Patient Active Problem List   Diagnosis Date Noted  . Attention to G-tube ([place]) 02/08/2018  . UTI (urinary tract infection) 04/20/2017  . UTI due to Klebsiella species 04/20/2017  . Essential  hypertension 04/20/2017  . Chronic pancreatitis ([place]) 04/20/2017  . 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 Malnutrition of moderate degree 04/20/2017  . Complicated UTI (urinary tract infection)   . 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 Rectal bleeding 09/28/2016  . Hyponatremia 04/28/2015  . Abdominal pain 04/27/2015  . Acute pancreatitis 04/27/2015  . Chronic alcoholic pancreatitis ([place]) 04/27/2015  . ETOH abuse 04/27/2015  . DM type 2 (diabetes mellitus, type 2) ([place]) 04/27/2015  . Hypertension, uncontrolled 04/27/2015  . Hyperlipidemia 04/27/2015  . Hypertensive urgency 04/27/2015  . Alcohol abuse     Past Surgical History:  Procedure Laterality Date  . COLONOSCOPY  2003   Dr. [name], polyps  . COLONOSCOPY  2005   Dr. [name], multiple diverticula  . COLONOSCOPY  2008   Dr. [name], diverticulosis  . COLONOSCOPY WITH PROPOFOL N/A 10/21/2016   Dr. [name]: Diverticulosis, two 5-7 mm polyps removed. path-tubular adenomas.  Next colonoscopy in 5 years.  . ESOPHAGOGASTRODUODENOSCOPY     Multiple EGDs. 1999 EGD showed gastric ulcers, no H pylori and benign biopsies performed by Dr. [name]. 2001 gastric ulcer healed. Last EGD 2005 had gastritis.  . Left partial mandibulectomy, Scapular free flap reconstruction, selective neck dissection, tracheotomy, and resection  of intraoral palate cancer. Left 03/01/2017   [facility]  . LUNG BIOPSY    . PARTIAL NEPHRECTOMY Left 2012  . POLYPECTOMY  10/21/2016   Procedure: POLYPECTOMY;  Surgeon: [name], MD;  [facility];  Service: Endoscopy;;  hepatic flexure x2        Home Medications    Prior to Admission medications   Medication Sig Start Date End Date Taking? Authorizing Provider  Acetaminophen (TYLENOL 8 HOUR PO) 2 tablets by Feeding Tube route 2 (two) times daily as needed.     [provider]  albuterol (PROVENTIL HFA;VENTOLIN HFA) 108 (90  Base) MCG/ACT inhaler Inhale 1-2 puffs into the lungs every 6 (six) hours as needed for wheezing or shortness of breath. 09/25/16   Nat Christen, MD  atorvastatin (LIPITOR) 40 MG tablet Take 40 mg by mouth daily at 6 PM.  02/14/17   [provider]  B-D UF III MINI PEN NEEDLES 31G X 5 MM MISC  03/25/17   [provider]  B-D ULTRAFINE III SHORT PEN 31G X 8 MM MISC  03/27/17   [provider]  emollient (BIAFINE) cream Apply topically as needed. 06/20/17   [name], MD  HYDROcodone-acetaminophen (NORCO) 7.5-325 MG tablet Take 1 tablet by mouth every 6 (six) hours as needed (pain). Patient taking differently: 1 tablet every 6 (six) hours as needed (pain).  01/12/17   [name], MD  ibuprofen (ADVIL,MOTRIN) 100 MG/5ML suspension Place 20 mLs (400 mg total) into feeding tube every 6 (six) hours as needed. Take with food. 05/30/17   [name], MD  lidocaine (XYLOCAINE) 2 % solution Patient: Mix 1part 2% viscous lidocaine, 1part H20. Swish &/or swallow 29mL of diluted mixture, 22min before meals and at bedtime, up to QID 04/14/18   [name], MD  lisinopril (PRINIVIL,ZESTRIL) 5 MG tablet Take 5 mg by mouth daily.  04/06/17   [provider]  loratadine (CLARITIN) 10 MG tablet Take 10 mg by mouth daily.    [provider]  LORazepam (ATIVAN) 1 MG tablet Take 1 mg by mouth 4 (four) times daily as needed for anxiety. For anxiety 03/31/15   [provider]  metFORMIN (GLUCOPHAGE-XR) 500 MG 24 hr tablet Take 1-2 tablets by mouth 3 (three) times daily. Takes 2 tablets in the morning, 1 tablet at lunch and 1 tablet at dinner 12/27/17   [provider]  pantoprazole (PROTONIX) 20 MG tablet Take 20 mg by mouth daily.     [provider]  senna (SENOKOT) 8.6 MG TABS tablet Take 1 tablet (8.6 mg total) by mouth at bedtime as needed for mild constipation. May be needed as pain medication can constipate. 01/12/17   [name], MD  tamsulosin  (FLOMAX) 0.4 MG CAPS capsule TAKE ONE CAPSULE BY MOUTH EVERY DAY FOR PROSTATE 05/19/17   [provider]  testosterone cypionate (DEPOTESTOSTERONE CYPIONATE) 200 MG/ML injection Inject 100 mg into the muscle every 14 (fourteen) days.  02/04/17   [provider]  traZODone (DESYREL) 50 MG tablet Take 50 mg by mouth at bedtime.    [provider]  Water For Irrigation, Sterile (FREE WATER) SOLN Place 100 mLs into feeding tube every 8 (eight) hours. 04/22/17   Orson Eva, MD    Family History Family History  Problem Relation Age of Onset  . Hypertension Mother   . Colon cancer Neg Hx     Social History Social History   Tobacco Use  .  Smoking status: Former Smoker    Packs/day: 0.50    Years: 35.00    Pack years: 17.50  . Smokeless tobacco: Never Used  Substance Use Topics  . Alcohol use: No    Comment: quit 2000 but relapse in 2016. no etoh since hospitalized 2016.   . Drug use: No     Allergies   Patient has no known allergies.   Review of Systems Review of Systems  Constitutional: Negative for chills and fever.  HENT: Negative.   Eyes: Negative.   Respiratory: Negative for chest tightness and shortness of breath.   Cardiovascular: Negative for chest pain.  Gastrointestinal: Negative for abdominal pain, nausea and vomiting.       Negative except as mentioned in HPI.   Genitourinary: Negative.   Musculoskeletal: Negative for arthralgias and joint swelling.  Skin: Negative.  Negative for rash and wound.  Neurological: Negative for dizziness, weakness, light-headedness and numbness.  Psychiatric/Behavioral: Negative.      Physical Exam Updated Vital Signs BP (!) 154/72   Pulse 75   Temp 98.2 F (36.8 C) (Oral)   Resp 16   Ht 5\' 11"  (1.803 m)   Wt 83.5 kg   SpO2 98%   BMI 25.66 kg/m   Physical Exam  Constitutional: He appears well-developed and well-nourished.  HENT:  Head: Atraumatic.  Eyes: Conjunctivae are normal.    Cardiovascular: Normal rate, regular rhythm and normal heart sounds.  Pulmonary/Chest: Effort normal and breath sounds normal. He has no wheezes.  Abdominal: Soft. Bowel sounds are normal. There is no tenderness.  Small amount of dried blood surrounding ostomy. No erythema or drainage.  The g tube is placed with the hub approx 3 cm above the ostomy opening.  Gentle pressure does not move the tubing in either direction.  Musculoskeletal: Normal range of motion.  Neurological: He is alert.  Skin: Skin is warm and dry.  Psychiatric: He has a normal mood and affect.  Nursing note and vitals reviewed.    ED Treatments / Results  Labs (all labs ordered are listed, but only abnormal results are displayed) Labs Reviewed - No data to display  EKG None  Radiology Dg Fluoro Rm 1-60 Min  Result Date: 05/15/2018 CLINICAL DATA:  PEG tube malfunction, tube check EXAM: FLOURO RM 1-60 MIN CONTRAST:  82mL ISOVUE-300 IOPAMIDOL (ISOVUE-300) INJECTION 61% FLUOROSCOPY TIME:  Fluoroscopy Time:  0 minutes 24 seconds Radiation Exposure Index (if provided by the fluoroscopic device): 8.9 mGy Number of Acquired Spot Images: 2 COMPARISON:  07/13/2017 FINDINGS: Contrast was injected through the gastrostomy tube. Contrast opacifies the gastric lumen. Normal passage of contrast across the pylorus into the duodenal sweep. No contrast extravasation. IMPRESSION: Gastrostomy tube is patent and located within the gastric lumen. Electronically Signed   By: Lavonia Dana M.D.   On: 05/15/2018 17:26    Procedures Procedures (including critical care time)  Medications Ordered in ED Medications  iopamidol (ISOVUE-300) 61 % injection 50 mL (50 mLs Other Contrast Given 05/15/18 1721)     Initial Impression / Assessment and Plan / ED Course  I have reviewed the triage vital signs and the nursing notes.  Pertinent labs & imaging results that were available during my care of the patient were reviewed by me and considered in  my medical decision making (see chart for details).     Attempts to deflate the balloon and reposition the tube unsuccessful, the tubing appears to be stuck within the site.  Fluoroscopy assessment with patency, so  will not attempt replacement tonight, especially since the tubing is resistent to movement. Arranged for replacement tomorrow with IR at [facility].  Pt aware of plan and instructions given.   Final Clinical Impressions(s) / ED Diagnoses   Final diagnoses:  PEG tube malfunction [facility])    ED Discharge Orders         Ordered    DG Fluoro Rm 1-60 Min - No Report  Status:  Canceled     05/15/18 2108    IR REPLACE G-TUBE SIMPLE WO FLUORO  Status:  Canceled     05/15/18 2140           Evalee Jefferson, PA-C 05/16/18 1315    Milton Ferguson, MD 05/16/18 [phone]

## 2018-05-16 NOTE — Telephone Encounter (Signed)
Pt went to the ED yesterday for peg tube evaluation per ED notes. Pt was able to use the Peg Tube for his supplement last night if needed but was asked to call Radiology first thing in the morning to schedule peg tube replacement per  ED visit.

## 2018-05-16 NOTE — Telephone Encounter (Signed)
Pt walked in office stating he was seen at [facility] yesterday for his peg tube. Pt left last night and was asked to call radiology. Pt called radiology and the contact person wasn't available. Pts girlfriend other called while pt was in the office and said she was able to get in touch with someone and pt needs to be at [facility] Radiology 1st floor to have peg tube replaced. Pt is ok with the report his girlfriend gave him and is leaving the office. Pt states if he has any other concerns, he will contact our office.

## 2018-05-17 ENCOUNTER — Ambulatory Visit (HOSPITAL_COMMUNITY): Payer: Medicare Other | Admitting: Physical Therapy

## 2018-05-17 DIAGNOSIS — I890 Lymphedema, not elsewhere classified: Secondary | ICD-10-CM

## 2018-05-17 NOTE — Therapy (Signed)
[place], [place], [address] Phone: [phone]   Fax:  [phone]  Physical Therapy Treatment  Patient Details  Name: Colin Wagner MRN: [id number] Date of Birth: 1950/12/02 Referring Provider: [name]    Encounter Date: 05/17/2018  PT End of Session - 05/17/18 1031    Visit Number  12    Number of Visits  18    Date for PT Re-Evaluation  06/02/18    Authorization - Visit Number  12    Authorization - Number of Visits  18    PT Start Time  0945    PT Stop Time  1030    PT Time Calculation (min)  45 min    Activity Tolerance  Patient tolerated treatment well    Behavior During Therapy  [facility] for tasks assessed/performed       Past Medical History:  Diagnosis Date  . Alcohol abuse   . Alcoholic pancreatitis 6962   admission  . Chronic back pain   . Chronic pancreatitis ([place])    based on ct findings 2016  . COPD (chronic obstructive pulmonary disease) ([place])   . Diabetes mellitus   . Diverticulosis   . Gastritis   . GERD (gastroesophageal reflux disease)   . Headache   . History of radiation therapy 05/12/17- 06/22/17   Left cheek and bilateral neck/ 60 Gy in 30 fractions to gross disease  . Hyperlipidemia   . Hypertension   . Peptic ulcer disease 1999   Per medical reports, no H pylori  . Renal cancer, left ([place]) 2012   he tells me that he has been released, ?and that he is free of cancer, and never had it to begin with.   . Right shoulder pain   . Testicular hypofunction     Past Surgical History:  Procedure Laterality Date  . COLONOSCOPY  2003   Dr. [name], polyps  . COLONOSCOPY  2005   Dr. [name], multiple diverticula  . COLONOSCOPY  2008   Dr. [name], diverticulosis  . COLONOSCOPY WITH PROPOFOL N/A 10/21/2016   Dr. [name]: Diverticulosis, two 5-7 mm polyps removed. path-tubular adenomas.  Next colonoscopy in 5 years.  . ESOPHAGOGASTRODUODENOSCOPY     Multiple EGDs. 1999  EGD showed gastric ulcers, no H pylori and benign biopsies performed by Dr. [name]. 2001 gastric ulcer healed. Last EGD 2005 had gastritis.  . IR REPLC GASTRO/COLONIC TUBE PERCUT W/FLUORO  05/16/2018  . Left partial mandibulectomy, Scapular free flap reconstruction, selective neck dissection, tracheotomy, and resection of intraoral palate cancer. Left 03/01/2017   [facility]  . LUNG BIOPSY    . PARTIAL NEPHRECTOMY Left 2012  . POLYPECTOMY  10/21/2016   Procedure: POLYPECTOMY;  Surgeon: [name], MD;  [facility];  Service: Endoscopy;;  hepatic flexure x2    There were no vitals filed for this visit.  Subjective Assessment - 05/17/18 0943    Subjective  Pt states that his feeding tube fell out Monday after therapy.  He had to go to [place] to have it placed back.  He is currently getting all of his nutrition from his feeding tube until he can swallow better.      Pertinent History  renal cancer, Cancer of Lt mandible with LT partial mandibulectomy on 03/01/2017, DM ,HTN     Currently in Pain?  No/denies    Pain Onset  More than a month ago  [place] Adult PT Treatment/Exercise - 05/17/18 0001      Exercises   Exercises  Neck      Neck Exercises: Seated   Other Seated Exercise  bulldog up to right/ up to left x 5; swallowing exercises tongue to roof of mouth swallow; bite tongue swallow x 5 each.     Other Seated Exercise  cervical rotation B;Long low "eee"; long high "eee"      Manual Therapy   Manual Therapy  Myofascial release;Manual Lymphatic Drainage (MLD);Compression Bandaging    Manual therapy comments  completed seperate from all other aspects of treatment     Myofascial Release  for scar under mandibular.     Manual Lymphatic Drainage (MLD)   cervical and facial to include internal mouth,  lip and eye area.                PT Short Term Goals - 05/04/18 1513      PT SHORT TERM GOAL #1    Title  Pt pain to be no greater than a 5/10 to allow pt to turn his head to at least 50 degrees in both directions  to assist in safety while driving    Time  3    Period  Weeks    Status  Partially Met   pain still occasionally an 8/10 but able to turn head to 50 degrees      PT SHORT TERM GOAL #2   Title  PT to be able to open his mouth 4.5 cm to make eating easier.     Time  3    Period  Weeks    Status  Revised   goal 3.0cm      PT SHORT TERM GOAL #3   Title  Pt measurements to have improved by 1 cm to allow pt to have improved self image.     Status  Achieved        PT Long Term Goals - 05/04/18 1515      PT LONG TERM GOAL #1   Title  PT to be able to turn his head 60 degrees or more in both directions to be able to see blind spot while driving    Time  6    Period  Weeks    Status  On-going      PT LONG TERM GOAL #2   Title  PT measurement to be decreased 1.5 cm to demonstrate decreased facial and cervical edema to improve swallowing.     Time  6    Period  Weeks    Status  Partially Met      PT LONG TERM GOAL #3   Title  PT to be wearing his compression for 20 mintues 2x a day at least 5 days a week to keep edema down.     Time  6    Period  Weeks    Status  Achieved      PT LONG TERM GOAL #4   Title  Pt to be able to demonstrate self massaging techniques to increase lymphatic flow to decrease swelling.     Time  6    Period  Weeks    Status  Achieved            Plan - 05/17/18 1032    Clinical Impression Statement  Therapist asked permission to complete manual inside his mouth, originally pt stated that he did not feel that he had swelling inside his mouth.  Pt  agreed.  Pt has marked swelling and tightness in Lt internal cheek .   Therapist spent approximately 5 minutes in this area as she did not want to cause significant soreness.     Rehab Potential  Good    PT Frequency  3x / week    PT Duration  6 weeks    PT Treatment/Interventions  ADLs/Self  Care Home Management;Therapeutic exercise;Patient/family education;Manual techniques;Compression bandaging    PT Next Visit Plan  Manual to increase internal cheek; take measurements.     PT Home Exercise Plan  cervical ROM ; thoracic decongestion.     Consulted and Agree with Plan of Care  Patient       Patient will benefit from skilled therapeutic intervention in order to improve the following deficits and impairments:  Decreased activity tolerance, Decreased scar mobility, Decreased range of motion, Decreased mobility, Pain, Postural dysfunction, Increased edema  Visit Diagnosis: Lymphedema, not elsewhere classified     Problem List Patient Active Problem List   Diagnosis Date Noted  . Attention to G-tube ([place]) 02/08/2018  . UTI (urinary tract infection) 04/20/2017  . UTI due to Klebsiella species 04/20/2017  . Essential hypertension 04/20/2017  . Chronic pancreatitis ([place]) 04/20/2017  . 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 Malnutrition of moderate degree 04/20/2017  . Complicated UTI (urinary tract infection)   . 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 Rectal bleeding 09/28/2016  . Hyponatremia 04/28/2015  . Abdominal pain 04/27/2015  . Acute pancreatitis 04/27/2015  . Chronic alcoholic pancreatitis ([place]) 04/27/2015  . ETOH abuse 04/27/2015  . DM type 2 (diabetes mellitus, type 2) ([place]) 04/27/2015  . Hypertension, uncontrolled 04/27/2015  . Hyperlipidemia 04/27/2015  . Hypertensive urgency 04/27/2015  . Alcohol abuse     Rayetta Humphrey, [place] CLT [phone] 05/17/2018, 10:38 AM  [place], [place], [address] Phone: [phone]   Fax:  [phone]  Name: Colin Wagner MRN: [id number] Date of Birth: [birth year]

## 2018-05-19 ENCOUNTER — Ambulatory Visit (HOSPITAL_COMMUNITY): Payer: Medicare Other | Admitting: Physical Therapy

## 2018-05-19 DIAGNOSIS — I890 Lymphedema, not elsewhere classified: Secondary | ICD-10-CM | POA: Diagnosis not present

## 2018-05-19 NOTE — Therapy (Signed)
[place], [place], [address] Phone: [phone]   Fax:  [phone]  Physical Therapy Treatment  Patient Details  Name: Colin Wagner MRN: [id number] Date of Birth: 06/28/1951 Referring Provider (PT): [name]    Encounter Date: 05/19/2018  PT End of Session - 05/19/18 1345    Visit Number  13    Number of Visits  18    Date for PT Re-Evaluation  06/02/18    Authorization - Visit Number  13    Authorization - Number of Visits  18    PT Start Time  5329    PT Stop Time  1344    PT Time Calculation (min)  41 min    Activity Tolerance  Patient tolerated treatment well    Behavior During Therapy  [facility] for tasks assessed/performed       Past Medical History:  Diagnosis Date  . Alcohol abuse   . Alcoholic pancreatitis 9242   admission  . Chronic back pain   . Chronic pancreatitis ([place])    based on ct findings 2016  . COPD (chronic obstructive pulmonary disease) ([place])   . Diabetes mellitus   . Diverticulosis   . Gastritis   . GERD (gastroesophageal reflux disease)   . Headache   . History of radiation therapy 05/12/17- 06/22/17   Left cheek and bilateral neck/ 60 Gy in 30 fractions to gross disease  . Hyperlipidemia   . Hypertension   . Peptic ulcer disease 1999   Per medical reports, no H pylori  . Renal cancer, left ([place]) 2012   he tells me that he has been released, ?and that he is free of cancer, and never had it to begin with.   . Right shoulder pain   . Testicular hypofunction     Past Surgical History:  Procedure Laterality Date  . COLONOSCOPY  2003   Dr. [name], polyps  . COLONOSCOPY  2005   Dr. [name], multiple diverticula  . COLONOSCOPY  2008   Dr. [name], diverticulosis  . COLONOSCOPY WITH PROPOFOL N/A 10/21/2016   Dr. [name]: Diverticulosis, two 5-7 mm polyps removed. path-tubular adenomas.  Next colonoscopy in 5 years.  . ESOPHAGOGASTRODUODENOSCOPY     Multiple EGDs.  1999 EGD showed gastric ulcers, no H pylori and benign biopsies performed by Dr. [name]. 2001 gastric ulcer healed. Last EGD 2005 had gastritis.  . IR REPLC GASTRO/COLONIC TUBE PERCUT W/FLUORO  05/16/2018  . Left partial mandibulectomy, Scapular free flap reconstruction, selective neck dissection, tracheotomy, and resection of intraoral palate cancer. Left 03/01/2017   [facility]  . LUNG BIOPSY    . PARTIAL NEPHRECTOMY Left 2012  . POLYPECTOMY  10/21/2016   Procedure: POLYPECTOMY;  Surgeon: [name], MD;  [facility];  Service: Endoscopy;;  hepatic flexure x2    There were no vitals filed for this visit.  Subjective Assessment - 05/19/18 1343    Subjective  Pt states that is seems like he has good days and bad day.      Pertinent History  renal cancer, Cancer of Lt mandible with LT partial mandibulectomy on 03/01/2017, DM ,HTN     Currently in Pain?  Yes    Pain Score  4     Pain Location  Jaw    Pain Orientation  Left    Pain Descriptors / Indicators  Shooting    Pain Type  Chronic pain  Pain Onset  More than a month ago    Aggravating Factors   unknown    Pain Relieving Factors  meds     Effect of Pain on Daily Activities  no affect          [facility] PT Assessment - 05/19/18 0001      Assessment   Medical Diagnosis  cervical lymphedema    Referring Provider (PT)  [name]     Onset Date/Surgical Date  01/21/18    Prior Therapy  none for this issue       Precautions   Precautions  None      Restrictions   Weight Bearing Restrictions  No      Home Environment   Living Environment  Private residence      Prior Function   Level of Independence  Independent      Cognition   Overall Cognitive Status  Within Functional Limits for tasks assessed      AROM   Cervical Flexion  50   was 40   Cervical Extension  44   was 35    Cervical - Right Side Bend  22   was 20   Cervical - Left Side Bend  32   was 16   Cervical -  Right Rotation  50   was 24    Cervical - Left Rotation  50   was 38        LYMPHEDEMA/ONCOLOGY QUESTIONNAIRE - 05/19/18 1305      Head and Neck   Right Lateral Nostril at base of nose to medial tragus   10.2 cm   was 11.3 at eval   Left Lateral Nostril at base of nose to medial tragus   10.8 cm   was 11   Right Corner of mouth to where ear lobe meets face  9.6 cm   was 10.4   Left Corner of mouth to where ear lobe meets face  11.1 cm   was 12   4 cm superior to sternal notch around neck  40.5 cm   was40.5   6 cm superior to sternal notch around neck  40 cm   was  40   8 cm superior to sternal notch around neck  42.2 cm   was 42.1               [facility] Adult PT Treatment/Exercise - 05/19/18 0001      Exercises   Exercises  Neck      Neck Exercises: Seated   Other Seated Exercise  bulldog up to right/ up to left x 5; swallowing exercises tongue to roof of mouth swallow; bite tongue swallow x 5 each.     Other Seated Exercise  cervical rotation B;Long low "eee"; long high "eee"      Manual Therapy   Manual Therapy  Myofascial release;Manual Lymphatic Drainage (MLD);Compression Bandaging;Taping    Manual therapy comments  completed seperate from all other aspects of treatment     Myofascial Release  for scar under mandibular.     Manual Lymphatic Drainage (MLD)   cervical and facial to include internal mouth,  lip and eye area.                PT Short Term Goals - 05/04/18 1513      PT SHORT TERM GOAL #1   Title  Pt pain to be no greater than a 5/10 to allow pt to turn his head to at least  50 degrees in both directions  to assist in safety while driving    Time  3    Period  Weeks    Status  Partially Met   pain still occasionally an 8/10 but able to turn head to 50 degrees      PT SHORT TERM GOAL #2   Title  PT to be able to open his mouth 4.5 cm to make eating easier.     Time  3    Period  Weeks    Status  Revised   goal 3.0cm      PT SHORT  TERM GOAL #3   Title  Pt measurements to have improved by 1 cm to allow pt to have improved self image.     Status  Achieved        PT Long Term Goals - 05/04/18 1515      PT LONG TERM GOAL #1   Title  PT to be able to turn his head 60 degrees or more in both directions to be able to see blind spot while driving    Time  6    Period  Weeks    Status  On-going      PT LONG TERM GOAL #2   Title  PT measurement to be decreased 1.5 cm to demonstrate decreased facial and cervical edema to improve swallowing.     Time  6    Period  Weeks    Status  Partially Met      PT LONG TERM GOAL #3   Title  PT to be wearing his compression for 20 mintues 2x a day at least 5 days a week to keep edema down.     Time  6    Period  Weeks    Status  Achieved      PT LONG TERM GOAL #4   Title  Pt to be able to demonstrate self massaging techniques to increase lymphatic flow to decrease swelling.     Time  6    Period  Weeks    Status  Achieved            Plan - 05/19/18 1345    Clinical Impression Statement  Noted deccreasd swelling, although still marked inside of left cheek.  Pt measurments continue to decrease but at a much slower rate.  Therapist attempted kinesiotaping of Lt jaw to improve facial edema.     Rehab Potential  Good    PT Frequency  3x / week    PT Duration  6 weeks    PT Treatment/Interventions  ADLs/Self Care Home Management;Therapeutic exercise;Patient/family education;Manual techniques;Compression bandaging    PT Next Visit Plan  Continue with manual, see if pt feels taping assisted in decreasing edema.     PT Home Exercise Plan  cervical ROM ; thoracic decongestion.     Consulted and Agree with Plan of Care  Patient       Patient will benefit from skilled therapeutic intervention in order to improve the following deficits and impairments:  Decreased activity tolerance, Decreased scar mobility, Decreased range of motion, Decreased mobility, Pain, Postural  dysfunction, Increased edema  Visit Diagnosis: Lymphedema, not elsewhere classified     Problem List Patient Active Problem List   Diagnosis Date Noted  . Attention to G-tube ([place]) 02/08/2018  . UTI (urinary tract infection) 04/20/2017  . UTI due to Klebsiella species 04/20/2017  . Essential hypertension 04/20/2017  . Chronic pancreatitis ([place]) 04/20/2017  . Squamous  cell carcinoma of mandible ([place]) 04/20/2017  . Malnutrition of moderate degree 04/20/2017  . Complicated UTI (urinary tract infection)   . 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 Rectal bleeding 09/28/2016  . Hyponatremia 04/28/2015  . Abdominal pain 04/27/2015  . Acute pancreatitis 04/27/2015  . Chronic alcoholic pancreatitis ([place]) 04/27/2015  . ETOH abuse 04/27/2015  . DM type 2 (diabetes mellitus, type 2) ([place]) 04/27/2015  . Hypertension, uncontrolled 04/27/2015  . Hyperlipidemia 04/27/2015  . Hypertensive urgency 04/27/2015  . Alcohol abuse     Rayetta Humphrey, [place] CLT [phone] 05/19/2018, 1:48 PM  [facility] [address] [place], [place], [address] Phone: [phone]   Fax:  [phone]  Name: Colin Wagner MRN: [id number] Date of Birth: 03/31/1951

## 2018-05-22 ENCOUNTER — Encounter (HOSPITAL_COMMUNITY): Payer: Self-pay | Admitting: Physical Therapy

## 2018-05-23 ENCOUNTER — Ambulatory Visit (HOSPITAL_COMMUNITY): Payer: Medicare Other | Attending: Radiation Oncology | Admitting: Physical Therapy

## 2018-05-23 ENCOUNTER — Encounter (HOSPITAL_COMMUNITY): Payer: Self-pay | Admitting: Physical Therapy

## 2018-05-23 DIAGNOSIS — I890 Lymphedema, not elsewhere classified: Secondary | ICD-10-CM | POA: Insufficient documentation

## 2018-05-23 NOTE — Therapy (Signed)
[place], [place], [address] Phone: [phone]   Fax:  [phone]  Physical Therapy Treatment  Patient Details  Name: Colin Wagner MRN: [id number] Date of Birth: Sep 02, 1950 Referring Provider (PT): [name]    Encounter Date: 05/23/2018  PT End of Session - 05/23/18 1020    Visit Number  14    Number of Visits  18    Date for PT Re-Evaluation  06/02/18    Authorization - Visit Number  14    Authorization - Number of Visits  18    PT Start Time  1025    PT Stop Time  1115    PT Time Calculation (min)  50 min    Activity Tolerance  Patient tolerated treatment well    Behavior During Therapy  [facility] for tasks assessed/performed       Past Medical History:  Diagnosis Date  . Alcohol abuse   . Alcoholic pancreatitis 8588   admission  . Chronic back pain   . Chronic pancreatitis ([place])    based on ct findings 2016  . COPD (chronic obstructive pulmonary disease) ([place])   . Diabetes mellitus   . Diverticulosis   . Gastritis   . GERD (gastroesophageal reflux disease)   . Headache   . History of radiation therapy 05/12/17- 06/22/17   Left cheek and bilateral neck/ 60 Gy in 30 fractions to gross disease  . Hyperlipidemia   . Hypertension   . Peptic ulcer disease 1999   Per medical reports, no H pylori  . Renal cancer, left ([place]) 2012   he tells me that he has been released, ?and that he is free of cancer, and never had it to begin with.   . Right shoulder pain   . Testicular hypofunction     Past Surgical History:  Procedure Laterality Date  . COLONOSCOPY  2003   Dr. [name], polyps  . COLONOSCOPY  2005   Dr. [name], multiple diverticula  . COLONOSCOPY  2008   Dr. [name], diverticulosis  . COLONOSCOPY WITH PROPOFOL N/A 10/21/2016   Dr. [name]: Diverticulosis, two 5-7 mm polyps removed. path-tubular adenomas.  Next colonoscopy in 5 years.  . ESOPHAGOGASTRODUODENOSCOPY     Multiple EGDs.  1999 EGD showed gastric ulcers, no H pylori and benign biopsies performed by Dr. [name]. 2001 gastric ulcer healed. Last EGD 2005 had gastritis.  . IR REPLC GASTRO/COLONIC TUBE PERCUT W/FLUORO  05/16/2018  . Left partial mandibulectomy, Scapular free flap reconstruction, selective neck dissection, tracheotomy, and resection of intraoral palate cancer. Left 03/01/2017   [facility]  . LUNG BIOPSY    . PARTIAL NEPHRECTOMY Left 2012  . POLYPECTOMY  10/21/2016   Procedure: POLYPECTOMY;  Surgeon: [name], MD;  [facility];  Service: Endoscopy;;  hepatic flexure x2    There were no vitals filed for this visit.  Subjective Assessment - 05/23/18 1019    Subjective  PT states that he might have seen a little improvement from the taping     Pertinent History  renal cancer, Cancer of Lt mandible with LT partial mandibulectomy on 03/01/2017, DM ,HTN     Currently in Pain?  No/denies    Pain Onset  More than a month ago               [facility] Adult PT Treatment/Exercise - 05/23/18 0001      Exercises   Exercises  Neck      Neck Exercises: Seated   Other Seated Exercise  bulldog up to right/ up to left x 5; swallowing exercises tongue to roof of mouth swallow; bite tongue swallow x 5 each.     Other Seated Exercise  cervical rotation B;Long low "eee"; long high "eee"      Neck Exercises: Supine   Other Supine Exercise  Shaker exercises x 15 seconds 5 reps;       Manual Therapy   Manual Therapy  Myofascial release;Manual Lymphatic Drainage (MLD);Compression Bandaging;Taping    Manual therapy comments  completed seperate from all other aspects of treatment     Myofascial Release  for scar under mandibular.     Manual Lymphatic Drainage (MLD)   cervical and facial to include internal mouth,  lip and eye area.                PT Short Term Goals - 05/04/18 1513      PT SHORT TERM GOAL #1   Title  Pt pain to be no greater than a 5/10 to  allow pt to turn his head to at least 50 degrees in both directions  to assist in safety while driving    Time  3    Period  Weeks    Status  Partially Met   pain still occasionally an 8/10 but able to turn head to 50 degrees      PT SHORT TERM GOAL #2   Title  PT to be able to open his mouth 4.5 cm to make eating easier.     Time  3    Period  Weeks    Status  Revised   goal 3.0cm      PT SHORT TERM GOAL #3   Title  Pt measurements to have improved by 1 cm to allow pt to have improved self image.     Status  Achieved        PT Long Term Goals - 05/04/18 1515      PT LONG TERM GOAL #1   Title  PT to be able to turn his head 60 degrees or more in both directions to be able to see blind spot while driving    Time  6    Period  Weeks    Status  On-going      PT LONG TERM GOAL #2   Title  PT measurement to be decreased 1.5 cm to demonstrate decreased facial and cervical edema to improve swallowing.     Time  6    Period  Weeks    Status  Partially Met      PT LONG TERM GOAL #3   Title  PT to be wearing his compression for 20 mintues 2x a day at least 5 days a week to keep edema down.     Time  6    Period  Weeks    Status  Achieved      PT LONG TERM GOAL #4   Title  Pt to be able to demonstrate self massaging techniques to increase lymphatic flow to decrease swelling.     Time  6    Period  Weeks    Status  Achieved            Plan - 05/23/18 1021    Clinical Impression Statement  Continuted with kinesotaping to Lt jaw to attempt to improve lymphatic flow.  PT continues to have significant scaring which is  limiting the amount of reduction in edema.  Continued to encourage pt to self massage with increased time spent in the submental area.      Rehab Potential  Good    PT Frequency  3x / week    PT Duration  6 weeks    PT Treatment/Interventions  ADLs/Self Care Home Management;Therapeutic exercise;Patient/family education;Manual techniques;Compression bandaging     PT Next Visit Plan  Continue with Manual; measure on Fridays.     PT Home Exercise Plan  cervical ROM ; thoracic decongestion.     Consulted and Agree with Plan of Care  Patient       Patient will benefit from skilled therapeutic intervention in order to improve the following deficits and impairments:  Decreased activity tolerance, Decreased scar mobility, Decreased range of motion, Decreased mobility, Pain, Postural dysfunction, Increased edema  Visit Diagnosis: Lymphedema, not elsewhere classified     Problem List Patient Active Problem List   Diagnosis Date Noted  . Attention to G-tube ([place]) 02/08/2018  . UTI (urinary tract infection) 04/20/2017  . UTI due to Klebsiella species 04/20/2017  . Essential hypertension 04/20/2017  . Chronic pancreatitis ([place]) 04/20/2017  . 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 Malnutrition of moderate degree 04/20/2017  . Complicated UTI (urinary tract infection)   . 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 Rectal bleeding 09/28/2016  . Hyponatremia 04/28/2015  . Abdominal pain 04/27/2015  . Acute pancreatitis 04/27/2015  . Chronic alcoholic pancreatitis ([place]) 04/27/2015  . ETOH abuse 04/27/2015  . DM type 2 (diabetes mellitus, type 2) ([place]) 04/27/2015  . Hypertension, uncontrolled 04/27/2015  . Hyperlipidemia 04/27/2015  . Hypertensive urgency 04/27/2015  . Alcohol abuse     Rayetta Humphrey, [place] CLT [phone] 05/23/2018, 11:15 AM  [place], [place], [address] Phone: [phone]   Fax:  [phone]  Name: ELAINE MIDDLETON MRN: [id number] Date of Birth: [birth year]

## 2018-05-24 ENCOUNTER — Ambulatory Visit (HOSPITAL_COMMUNITY): Payer: Medicare Other | Admitting: Physical Therapy

## 2018-05-24 DIAGNOSIS — I890 Lymphedema, not elsewhere classified: Secondary | ICD-10-CM

## 2018-05-24 NOTE — Therapy (Signed)
[place], [place], [address] Phone: [phone]   Fax:  [phone]  Physical Therapy Treatment  Patient Details  Name: Colin Wagner MRN: [id number] Date of Birth: 01-Sep-1950 Referring Provider (PT): [name]    Encounter Date: 05/24/2018  PT End of Session - 05/24/18 1654    Visit Number  15    Number of Visits  18    Date for PT Re-Evaluation  06/02/18    Authorization - Visit Number  15    Authorization - Number of Visits  18    PT Start Time  2482    PT Stop Time  1425    PT Time Calculation (min)  40 min    Activity Tolerance  Patient tolerated treatment well    Behavior During Therapy  [facility] for tasks assessed/performed       Past Medical History:  Diagnosis Date  . Alcohol abuse   . Alcoholic pancreatitis 5003   admission  . Chronic back pain   . Chronic pancreatitis ([place])    based on ct findings 2016  . COPD (chronic obstructive pulmonary disease) ([place])   . Diabetes mellitus   . Diverticulosis   . Gastritis   . GERD (gastroesophageal reflux disease)   . Headache   . History of radiation therapy 05/12/17- 06/22/17   Left cheek and bilateral neck/ 60 Gy in 30 fractions to gross disease  . Hyperlipidemia   . Hypertension   . Peptic ulcer disease 1999   Per medical reports, no H pylori  . Renal cancer, left ([place]) 2012   he tells me that he has been released, ?and that he is free of cancer, and never had it to begin with.   . Right shoulder pain   . Testicular hypofunction     Past Surgical History:  Procedure Laterality Date  . COLONOSCOPY  2003   Dr. [name], polyps  . COLONOSCOPY  2005   Dr. [name], multiple diverticula  . COLONOSCOPY  2008   Dr. [name], diverticulosis  . COLONOSCOPY WITH PROPOFOL N/A 10/21/2016   Dr. [name]: Diverticulosis, two 5-7 mm polyps removed. path-tubular adenomas.  Next colonoscopy in 5 years.  . ESOPHAGOGASTRODUODENOSCOPY     Multiple EGDs.  1999 EGD showed gastric ulcers, no H pylori and benign biopsies performed by Dr. [name]. 2001 gastric ulcer healed. Last EGD 2005 had gastritis.  . IR REPLC GASTRO/COLONIC TUBE PERCUT W/FLUORO  05/16/2018  . Left partial mandibulectomy, Scapular free flap reconstruction, selective neck dissection, tracheotomy, and resection of intraoral palate cancer. Left 03/01/2017   [facility]  . LUNG BIOPSY    . PARTIAL NEPHRECTOMY Left 2012  . POLYPECTOMY  10/21/2016   Procedure: POLYPECTOMY;  Surgeon: [name], MD;  [facility];  Service: Endoscopy;;  hepatic flexure x2    There were no vitals filed for this visit.  Subjective Assessment - 05/24/18 1650    Subjective  Pt states that he feels that his swelling in his jaw and under his chin is up.  He states that it gets like this sometimes and he doesnt know why.  (Temperature is 95.)    Pertinent History  renal cancer, Cancer of Lt mandible with LT partial mandibulectomy on 03/01/2017, DM ,HTN     Currently in Pain?  Yes    Pain Score  5     Pain Location  Jaw    Pain Orientation  Left    Pain Descriptors / Indicators  Aching;Tightness    Pain Onset  More than a month ago    Pain Frequency  Constant    Aggravating Factors   unknown    Pain Relieving Factors  self massage     Effect of Pain on Daily Activities  no affects                        [facility] Adult PT Treatment/Exercise - 05/24/18 0001      Exercises   Exercises  Neck      Neck Exercises: Seated   Other Seated Exercise  --    Other Seated Exercise  --      Neck Exercises: Supine   Other Supine Exercise  --      Manual Therapy   Manual Therapy  Myofascial release;Manual Lymphatic Drainage (MLD);Compression Bandaging;Taping    Manual therapy comments  completed seperate from all other aspects of treatment     Myofascial Release  for scar under mandibular.     Manual Lymphatic Drainage (MLD)   cervical and facial to  include internal mouth,  lip and eye area.                PT Short Term Goals - 05/04/18 1513      PT SHORT TERM GOAL #1   Title  Pt pain to be no greater than a 5/10 to allow pt to turn his head to at least 50 degrees in both directions  to assist in safety while driving    Time  3    Period  Weeks    Status  Partially Met   pain still occasionally an 8/10 but able to turn head to 50 degrees      PT SHORT TERM GOAL #2   Title  PT to be able to open his mouth 4.5 cm to make eating easier.     Time  3    Period  Weeks    Status  Revised   goal 3.0cm      PT SHORT TERM GOAL #3   Title  Pt measurements to have improved by 1 cm to allow pt to have improved self image.     Status  Achieved        PT Long Term Goals - 05/04/18 1515      PT LONG TERM GOAL #1   Title  PT to be able to turn his head 60 degrees or more in both directions to be able to see blind spot while driving    Time  6    Period  Weeks    Status  On-going      PT LONG TERM GOAL #2   Title  PT measurement to be decreased 1.5 cm to demonstrate decreased facial and cervical edema to improve swallowing.     Time  6    Period  Weeks    Status  Partially Met      PT LONG TERM GOAL #3   Title  PT to be wearing his compression for 20 mintues 2x a day at least 5 days a week to keep edema down.     Time  6    Period  Weeks    Status  Achieved      PT LONG TERM GOAL #4   Title  Pt to be able to demonstrate self massaging techniques to increase lymphatic flow to decrease swelling.  Time  6    Period  Weeks    Status  Achieved            Plan - 05/24/18 1655    Clinical Impression Statement  PT facial swelling increased today for unknown reason; possibly due to high temperature outside.  Therapist spent almost manual at submental and mandibular area.  Therapist taped for improved lymphatic drainage following treatment.    Rehab Potential  Good    PT Frequency  3x / week    PT Duration  6 weeks     PT Treatment/Interventions  ADLs/Self Care Home Management;Therapeutic exercise;Patient/family education;Manual techniques;Compression bandaging    PT Next Visit Plan  Continue with Manual; measure on Fridays.     PT Home Exercise Plan  cervical ROM ; thoracic decongestion.     Consulted and Agree with Plan of Care  Patient       Patient will benefit from skilled therapeutic intervention in order to improve the following deficits and impairments:  Decreased activity tolerance, Decreased scar mobility, Decreased range of motion, Decreased mobility, Pain, Postural dysfunction, Increased edema  Visit Diagnosis: Lymphedema, not elsewhere classified     Problem List Patient Active Problem List   Diagnosis Date Noted  . Attention to G-tube ([place]) 02/08/2018  . UTI (urinary tract infection) 04/20/2017  . UTI due to Klebsiella species 04/20/2017  . Essential hypertension 04/20/2017  . Chronic pancreatitis ([place]) 04/20/2017  . 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 Malnutrition of moderate degree 04/20/2017  . Complicated UTI (urinary tract infection)   . 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 Rectal bleeding 09/28/2016  . Hyponatremia 04/28/2015  . Abdominal pain 04/27/2015  . Acute pancreatitis 04/27/2015  . Chronic alcoholic pancreatitis ([place]) 04/27/2015  . ETOH abuse 04/27/2015  . DM type 2 (diabetes mellitus, type 2) ([place]) 04/27/2015  . Hypertension, uncontrolled 04/27/2015  . Hyperlipidemia 04/27/2015  . Hypertensive urgency 04/27/2015  . Alcohol abuse    Rayetta Humphrey, [place] CLT [phone] 05/24/2018, 4:58 PM  [place] [address] [place], [place], [address] Phone: [phone]   Fax:  [phone]  Name: ADITYA NASTASI MRN: [id number] Date of Birth: 03-20-1951

## 2018-05-26 ENCOUNTER — Ambulatory Visit (HOSPITAL_COMMUNITY): Payer: Medicare Other | Admitting: Physical Therapy

## 2018-05-26 DIAGNOSIS — I890 Lymphedema, not elsewhere classified: Secondary | ICD-10-CM | POA: Diagnosis not present

## 2018-05-26 NOTE — Therapy (Signed)
[place], [place], [address] Phone: [phone]   Fax:  [phone]  Physical Therapy Treatment  Patient Details  Name: Colin Wagner MRN: [id number] Date of Birth: 09-Mar-1951 Referring Provider (PT): [name]    Encounter Date: 05/26/2018  PT End of Session - 05/26/18 1242    Visit Number  16    Number of Visits  18    Date for PT Re-Evaluation  06/02/18    Authorization - Visit Number  16    Authorization - Number of Visits  18    PT Start Time  0953    PT Stop Time  1033    PT Time Calculation (min)  40 min    Activity Tolerance  Patient tolerated treatment well    Behavior During Therapy  [facility] for tasks assessed/performed       Past Medical History:  Diagnosis Date  . Alcohol abuse   . Alcoholic pancreatitis 6222   admission  . Chronic back pain   . Chronic pancreatitis ([place])    based on ct findings 2016  . COPD (chronic obstructive pulmonary disease) ([place])   . Diabetes mellitus   . Diverticulosis   . Gastritis   . GERD (gastroesophageal reflux disease)   . Headache   . History of radiation therapy 05/12/17- 06/22/17   Left cheek and bilateral neck/ 60 Gy in 30 fractions to gross disease  . Hyperlipidemia   . Hypertension   . Peptic ulcer disease 1999   Per medical reports, no H pylori  . Renal cancer, left ([place]) 2012   he tells me that he has been released, ?and that he is free of cancer, and never had it to begin with.   . Right shoulder pain   . Testicular hypofunction     Past Surgical History:  Procedure Laterality Date  . COLONOSCOPY  2003   Dr. [name], polyps  . COLONOSCOPY  2005   Dr. [name], multiple diverticula  . COLONOSCOPY  2008   Dr. [name], diverticulosis  . COLONOSCOPY WITH PROPOFOL N/A 10/21/2016   Dr. [name]: Diverticulosis, two 5-7 mm polyps removed. path-tubular adenomas.  Next colonoscopy in 5 years.  . ESOPHAGOGASTRODUODENOSCOPY     Multiple EGDs.  1999 EGD showed gastric ulcers, no H pylori and benign biopsies performed by Dr. [name]. 2001 gastric ulcer healed. Last EGD 2005 had gastritis.  . IR REPLC GASTRO/COLONIC TUBE PERCUT W/FLUORO  05/16/2018  . Left partial mandibulectomy, Scapular free flap reconstruction, selective neck dissection, tracheotomy, and resection of intraoral palate cancer. Left 03/01/2017   [facility]  . LUNG BIOPSY    . PARTIAL NEPHRECTOMY Left 2012  . POLYPECTOMY  10/21/2016   Procedure: POLYPECTOMY;  Surgeon: [name], MD;  Location[facility];  Service: Endoscopy;;  hepatic flexure x2    There were no vitals filed for this visit.  Subjective Assessment - 05/26/18 1241    Subjective  PT state that he is feeling better he has been doing his self massage.  He goes in for preop today surgery on Wed. but will be here on Monday     Pertinent History  renal cancer, Cancer of Lt mandible with LT partial mandibulectomy on 03/01/2017, DM ,HTN     Currently in Pain?  Yes    Pain Score  3     Pain Location  Jaw    Pain Orientation  Left  Pain Descriptors / Indicators  Tender    Pain Type  Chronic pain    Pain Onset  More than a month ago    Pain Frequency  Intermittent                       [facility] Adult PT Treatment/Exercise - 05/26/18 0001      Exercises   Exercises  Neck      Manual Therapy   Manual Therapy  Myofascial release;Manual Lymphatic Drainage (MLD);Compression Bandaging;Taping    Manual therapy comments  completed seperate from all other aspects of treatment     Myofascial Release  for scar under mandibular.     Manual Lymphatic Drainage (MLD)   cervical and facial to include internal mouth,  lip and eye area.                PT Short Term Goals - 05/04/18 1513      PT SHORT TERM GOAL #1   Title  Pt pain to be no greater than a 5/10 to allow pt to turn his head to at least 50 degrees in both directions  to assist in safety while  driving    Time  3    Period  Weeks    Status  Partially Met   pain still occasionally an 8/10 but able to turn head to 50 degrees      PT SHORT TERM GOAL #2   Title  PT to be able to open his mouth 4.5 cm to make eating easier.     Time  3    Period  Weeks    Status  Revised   goal 3.0cm      PT SHORT TERM GOAL #3   Title  Pt measurements to have improved by 1 cm to allow pt to have improved self image.     Status  Achieved        PT Long Term Goals - 05/04/18 1515      PT LONG TERM GOAL #1   Title  PT to be able to turn his head 60 degrees or more in both directions to be able to see blind spot while driving    Time  6    Period  Weeks    Status  On-going      PT LONG TERM GOAL #2   Title  PT measurement to be decreased 1.5 cm to demonstrate decreased facial and cervical edema to improve swallowing.     Time  6    Period  Weeks    Status  Partially Met      PT LONG TERM GOAL #3   Title  PT to be wearing his compression for 20 mintues 2x a day at least 5 days a week to keep edema down.     Time  6    Period  Weeks    Status  Achieved      PT LONG TERM GOAL #4   Title  Pt to be able to demonstrate self massaging techniques to increase lymphatic flow to decrease swelling.     Time  6    Period  Weeks    Status  Achieved            Plan - 05/26/18 1243    Clinical Impression Statement  Focused today's treatment on submental and cheek area both internally and externally.  Pt with improved movement of mouth following manual.  Therapist encouraged pt  to complete manual over the weeikend .    Rehab Potential  Good    PT Frequency  3x / week    PT Duration  6 weeks    PT Treatment/Interventions  ADLs/Self Care Home Management;Therapeutic exercise;Patient/family education;Manual techniques;Compression bandaging    PT Next Visit Plan  Continue with Manual; measure Monday as pt had to leave due to preop in [place]  cervical ROM ;  thoracic decongestion.     Consulted and Agree with Plan of Care  Patient       Patient will benefit from skilled therapeutic intervention in order to improve the following deficits and impairments:  Decreased activity tolerance, Decreased scar mobility, Decreased range of motion, Decreased mobility, Pain, Postural dysfunction, Increased edema  Visit Diagnosis: Lymphedema, not elsewhere classified     Problem List Patient Active Problem List   Diagnosis Date Noted  . Attention to G-tube ([place]) 02/08/2018  . UTI (urinary tract infection) 04/20/2017  . UTI due to Klebsiella species 04/20/2017  . Essential hypertension 04/20/2017  . Chronic pancreatitis ([place]) 04/20/2017  . 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 Malnutrition of moderate degree 04/20/2017  . Complicated UTI (urinary tract infection)   . 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 Rectal bleeding 09/28/2016  . Hyponatremia 04/28/2015  . Abdominal pain 04/27/2015  . Acute pancreatitis 04/27/2015  . Chronic alcoholic pancreatitis ([place]) 04/27/2015  . ETOH abuse 04/27/2015  . DM type 2 (diabetes mellitus, type 2) ([place]) 04/27/2015  . Hypertension, uncontrolled 04/27/2015  . Hyperlipidemia 04/27/2015  . Hypertensive urgency 04/27/2015  . Alcohol abuse     Rayetta Humphrey, [place] CLT [phone] 05/26/2018, 12:45 PM  [place] [address] [place], [place], [address] Phone: [phone]   Fax:  [phone]  Name: Colin Wagner MRN: [id number] Date of Birth: 01/26/1951

## 2018-05-29 ENCOUNTER — Ambulatory Visit (HOSPITAL_COMMUNITY): Payer: Medicare Other | Admitting: Physical Therapy

## 2018-05-29 DIAGNOSIS — I890 Lymphedema, not elsewhere classified: Secondary | ICD-10-CM

## 2018-05-29 NOTE — Therapy (Signed)
[place], [place], [address] Phone: [phone]   Fax:  [phone]  Physical Therapy Treatment  Patient Details  Name: Colin Wagner MRN: [id number] Date of Birth: 1950-09-06 Referring Provider (PT): [name]    Encounter Date: 05/29/2018  PT End of Session - 05/29/18 1118    Visit Number  17    Number of Visits  18    Date for PT Re-Evaluation  06/02/18    Authorization - Visit Number  61    Authorization - Number of Visits  18    PT Start Time  7124    PT Stop Time  1115    PT Time Calculation (min)  45 min    Activity Tolerance  Patient tolerated treatment well    Behavior During Therapy  [facility] for tasks assessed/performed       Past Medical History:  Diagnosis Date  . Alcohol abuse   . Alcoholic pancreatitis 5809   admission  . Chronic back pain   . Chronic pancreatitis ([place])    based on ct findings 2016  . COPD (chronic obstructive pulmonary disease) ([place])   . Diabetes mellitus   . Diverticulosis   . Gastritis   . GERD (gastroesophageal reflux disease)   . Headache   . History of radiation therapy 05/12/17- 06/22/17   Left cheek and bilateral neck/ 60 Gy in 30 fractions to gross disease  . Hyperlipidemia   . Hypertension   . Peptic ulcer disease 1999   Per medical reports, no H pylori  . Renal cancer, left ([place]) 2012   he tells me that he has been released, ?and that he is free of cancer, and never had it to begin with.   . Right shoulder pain   . Testicular hypofunction     Past Surgical History:  Procedure Laterality Date  . COLONOSCOPY  2003   Dr. [name], polyps  . COLONOSCOPY  2005   Dr. [name], multiple diverticula  . COLONOSCOPY  2008   Dr. [name], diverticulosis  . COLONOSCOPY WITH PROPOFOL N/A 10/21/2016   Dr. [name]: Diverticulosis, two 5-7 mm polyps removed. path-tubular adenomas.  Next colonoscopy in 5 years.  . ESOPHAGOGASTRODUODENOSCOPY     Multiple EGDs.  1999 EGD showed gastric ulcers, no H pylori and benign biopsies performed by Dr. [name]. 2001 gastric ulcer healed. Last EGD 2005 had gastritis.  . IR REPLC GASTRO/COLONIC TUBE PERCUT W/FLUORO  05/16/2018  . Left partial mandibulectomy, Scapular free flap reconstruction, selective neck dissection, tracheotomy, and resection of intraoral palate cancer. Left 03/01/2017   [facility]  . LUNG BIOPSY    . PARTIAL NEPHRECTOMY Left 2012  . POLYPECTOMY  10/21/2016   Procedure: POLYPECTOMY;  Surgeon: [name], MD;  Location[facility];  Service: Endoscopy;;  hepatic flexure x2    There were no vitals filed for this visit.  Subjective Assessment - 05/29/18 1032    Subjective  Pt having surgery on Wed. will put therapy on hold.  Pt to see how long MD wants pt to wait before resuming therapy     Pertinent History  renal cancer, Cancer of Lt mandible with LT partial mandibulectomy on 03/01/2017, DM ,HTN     Currently in Pain?  Yes    Pain Score  3     Pain Location  Jaw    Pain Orientation  Left    Pain Descriptors / Indicators  Tightness;Throbbing  Pain Onset  More than a month ago         [facility] PT Assessment - 05/29/18 0001      Assessment   Medical Diagnosis  cervical lymphedema    Referring Provider (PT)  [name]     Onset Date/Surgical Date  01/21/18    Prior Therapy  none for this issue       Precautions   Precautions  None      Restrictions   Weight Bearing Restrictions  No      Home Environment   Living Environment  Private residence      Prior Function   Level of Independence  Independent      Cognition   Overall Cognitive Status  Within Functional Limits for tasks assessed      AROM   Cervical Flexion  53   was 40   Cervical Extension  45   was 35    Cervical - Right Side Bend  22   was 20   Cervical - Left Side Bend  35   was 16   Cervical - Right Rotation  50   was 24    Cervical - Left Rotation  52   was 38         LYMPHEDEMA/ONCOLOGY QUESTIONNAIRE - 05/29/18 1104      Head and Neck   Right Lateral Nostril at base of nose to medial tragus   10.1 cm    Left Lateral Nostril at base of nose to medial tragus   10.9 cm    Right Corner of mouth to where ear lobe meets face  9.8 cm    Left Corner of mouth to where ear lobe meets face  11.1 cm    4 cm superior to sternal notch around neck  40 cm    6 cm superior to sternal notch around neck  40 cm    8 cm superior to sternal notch around neck  41.5 cm                [facility] Adult PT Treatment/Exercise - 05/29/18 0001      Exercises   Exercises  Neck      Manual Therapy   Manual Therapy  Myofascial release;Manual Lymphatic Drainage (MLD);Compression Bandaging;Taping    Manual therapy comments  completed seperate from all other aspects of treatment     Myofascial Release  for scar under mandibular.     Manual Lymphatic Drainage (MLD)   cervical and facial to include internal mouth,  lip and eye area.     Compression Bandaging  taping and measurement.                PT Short Term Goals - 05/29/18 1120      PT SHORT TERM GOAL #1   Title  Pt pain to be no greater than a 5/10 to allow pt to turn his head to at least 50 degrees in both directions  to assist in safety while driving    Time  3    Period  Weeks    Status  Partially Met   pain still occasionally an 6/10 but able to turn head to 50 degrees      PT SHORT TERM GOAL #2   Title  PT to be able to open his mouth 4.5 cm to make eating easier.     Time  3    Period  Weeks    Status  Not Met  goal 3.0cm      PT SHORT TERM GOAL #3   Title  Pt measurements to have improved by 1 cm to allow pt to have improved self image.     Status  Achieved        PT Long Term Goals - 05/29/18 1121      PT LONG TERM GOAL #1   Title  PT to be able to turn his head 60 degrees or more in both directions to be able to see blind spot while driving    Time  6    Period  Weeks     Status  On-going      PT LONG TERM GOAL #2   Title  PT measurement to be decreased 1.5 cm to demonstrate decreased facial and cervical edema to improve swallowing.     Time  6    Period  Weeks    Status  Partially Met      PT LONG TERM GOAL #3   Title  PT to be wearing his compression for 20 mintues 2x a day at least 5 days a week to keep edema down.     Time  6    Period  Weeks    Status  Achieved      PT LONG TERM GOAL #4   Title  Pt to be able to demonstrate self massaging techniques to increase lymphatic flow to decrease swelling.     Time  6    Period  Weeks    Status  Achieved            Plan - 05/29/18 1118    Clinical Impression Statement  Measured today with small gains noted.  PT having surgery on Wed. on Rt carotid therefore therapy will be placed on hold until surgeon confirms that patient may return.      Rehab Potential  Good    PT Frequency  3x / week    PT Duration  6 weeks    PT Treatment/Interventions  ADLs/Self Care Home Management;Therapeutic exercise;Patient/family education;Manual techniques;Compression bandaging    PT Next Visit Plan  Hold therapy until surgeon okays return to treatment.  Show pt shelf compression to see if he is interested in buying .    PT Home Exercise Plan  cervical ROM ; thoracic decongestion.     Consulted and Agree with Plan of Care  Patient       Patient will benefit from skilled therapeutic intervention in order to improve the following deficits and impairments:  Decreased activity tolerance, Decreased scar mobility, Decreased range of motion, Decreased mobility, Pain, Postural dysfunction, Increased edema  Visit Diagnosis: Lymphedema, not elsewhere classified     Problem List Patient Active Problem List   Diagnosis Date Noted  . Attention to G-tube ([place]) 02/08/2018  . UTI (urinary tract infection) 04/20/2017  . UTI due to Klebsiella species 04/20/2017  . Essential hypertension 04/20/2017  . Chronic pancreatitis  ([place]) 04/20/2017  . 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 Malnutrition of moderate degree 04/20/2017  . Complicated UTI (urinary tract infection)   . 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 Rectal bleeding 09/28/2016  . Hyponatremia 04/28/2015  . Abdominal pain 04/27/2015  . Acute pancreatitis 04/27/2015  . Chronic alcoholic pancreatitis ([place]) 04/27/2015  . ETOH abuse 04/27/2015  . DM type 2 (diabetes mellitus, type 2) ([place]) 04/27/2015  . Hypertension, uncontrolled 04/27/2015  . Hyperlipidemia 04/27/2015  . Hypertensive urgency 04/27/2015  . Alcohol abuse  Rayetta Humphrey, PT CLT [phone] 05/29/2018, [place] [address] [place], [place], [address] Phone: [phone]   Fax:  [phone]  Name: Colin Wagner MRN: [id number] Date of Birth: Jun 03, 1951

## 2018-05-31 ENCOUNTER — Encounter (HOSPITAL_COMMUNITY): Payer: Self-pay | Admitting: Physical Therapy

## 2018-06-02 ENCOUNTER — Encounter (HOSPITAL_COMMUNITY): Payer: Self-pay | Admitting: Physical Therapy

## 2018-06-20 ENCOUNTER — Ambulatory Visit (HOSPITAL_COMMUNITY): Payer: Medicare Other | Admitting: Physical Therapy

## 2018-06-20 ENCOUNTER — Other Ambulatory Visit: Payer: Self-pay

## 2018-06-20 ENCOUNTER — Encounter (HOSPITAL_COMMUNITY): Payer: Self-pay | Admitting: Physical Therapy

## 2018-06-20 DIAGNOSIS — I890 Lymphedema, not elsewhere classified: Secondary | ICD-10-CM

## 2018-06-20 NOTE — Therapy (Signed)
[place], [place], [address] Phone: [phone]   Fax:  [phone]  Physical Therapy Treatment  Patient Details  Name: Colin Wagner MRN: [id number] Date of Birth: 06-Sep-1950 Referring Provider (PT): [name]    Encounter Date: 06/20/2018  PT End of Session - 06/20/18 1038    Visit Number  18    Number of Visits  30    Date for PT Re-Evaluation  07/20/18    PT Start Time  0940    PT Stop Time  1030    PT Time Calculation (min)  50 min    Activity Tolerance  Patient tolerated treatment well    Behavior During Therapy  [facility] for tasks assessed/performed       Past Medical History:  Diagnosis Date  . Alcohol abuse   . Alcoholic pancreatitis 6160   admission  . Chronic back pain   . Chronic pancreatitis ([place])    based on ct findings 2016  . COPD (chronic obstructive pulmonary disease) ([place])   . Diabetes mellitus   . Diverticulosis   . Gastritis   . GERD (gastroesophageal reflux disease)   . Headache   . History of radiation therapy 05/12/17- 06/22/17   Left cheek and bilateral neck/ 60 Gy in 30 fractions to gross disease  . Hyperlipidemia   . Hypertension   . Peptic ulcer disease 1999   Per medical reports, no H pylori  . Renal cancer, left ([place]) 2012   he tells me that he has been released, ?and that he is free of cancer, and never had it to begin with.   . Right shoulder pain   . Testicular hypofunction     Past Surgical History:  Procedure Laterality Date  . COLONOSCOPY  2003   Dr. [name], polyps  . COLONOSCOPY  2005   Dr. [name], multiple diverticula  . COLONOSCOPY  2008   Dr. [name], diverticulosis  . COLONOSCOPY WITH PROPOFOL N/A 10/21/2016   Dr. [name]: Diverticulosis, two 5-7 mm polyps removed. path-tubular adenomas.  Next colonoscopy in 5 years.  . ESOPHAGOGASTRODUODENOSCOPY     Multiple EGDs. 1999 EGD showed gastric ulcers, no H pylori and benign biopsies performed by  Dr. [name]. 2001 gastric ulcer healed. Last EGD 2005 had gastritis.  . IR REPLC GASTRO/COLONIC TUBE PERCUT W/FLUORO  05/16/2018  . Left partial mandibulectomy, Scapular free flap reconstruction, selective neck dissection, tracheotomy, and resection of intraoral palate cancer. Left 03/01/2017   [facility]  . LUNG BIOPSY    . PARTIAL NEPHRECTOMY Left 2012  . POLYPECTOMY  10/21/2016   Procedure: POLYPECTOMY;  Surgeon: [name], MD;  [facility];  Service: Endoscopy;;  hepatic flexure x2    There were no vitals filed for this visit.  Subjective Assessment - 06/20/18 0936    Subjective  PT has returned after having a stent placed into his Rt carotid.  Pt states he has been to his follow up and his surgeon had cleared him to return to decongestive therapy for the Lt side of his face after two weeks.  The surgery was on the 9th of [place].   PT states that he has been letting his surgery site heal therefore he has not been completing any manual or using his compression garment.      Pertinent History  renal cancer, Cancer of Lt mandible with LT partial mandibulectomy on 03/01/2017, DM ,HTN  Currently in Pain?  --   No real pain just tight.    Pain Location  Jaw    Pain Orientation  Left    Pain Descriptors / Indicators  Tightness    Pain Onset  More than a month ago    Pain Frequency  Intermittent    Aggravating Factors   can not  figure it out     Pain Relieving Factors  massaging          [facility] PT Assessment - 06/20/18 0001      Assessment   Medical Diagnosis  cervical lymphedema    Referring Provider (PT)  [name]     Onset Date/Surgical Date  01/21/18    Prior Therapy  none for this issue       Precautions   Precautions  None      Restrictions   Weight Bearing Restrictions  No      Home Environment   Living Environment  Private residence      Prior Function   Level of Independence  Independent      Cognition   Overall  Cognitive Status  Within Functional Limits for tasks assessed      AROM    LYMPHEDEMA/ONCOLOGY QUESTIONNAIRE - 06/20/18 0947      What other symptoms do you have   Are you Having Heaviness or Tightness  Yes    Are you having Pain  No    Is there Decreased scar mobility  Yes      Lymphedema Assessments   Lymphedema Assessments  Head and Neck      Head and Neck   Right Lateral Nostril at base of nose to medial tragus   11 cm   was 10.1 prior to surgery on 10/7   Left Lateral Nostril at base of nose to medial tragus   11.8 cm   was 10.9   Right Corner of mouth to where ear lobe meets face  10.5 cm   9.8 prior to surgery    Left Corner of mouth to where ear lobe meets face  12.2 cm   was 11.1   4 cm superior to sternal notch around neck  40.8 cm   40 prior to surgery    6 cm superior to sternal notch around neck  41 cm   40 prior to surgery    8 cm superior to sternal notch around neck  45 cm   41.5 prior to surgery                [facility] Adult PT Treatment/Exercise - 06/20/18 0001      Exercises   Exercises  Neck      Manual Therapy   Manual Therapy  Myofascial release;Manual Lymphatic Drainage (MLD);Compression Bandaging;Taping    Manual therapy comments  completed seperate from all other aspects of treatment     Myofascial Release  for scar under mandibular.     Manual Lymphatic Drainage (MLD)   thoracic, cervical and facial to include internal mouth,  lip and eye area.     Compression Bandaging  measurement.              PT Education - 06/20/18 1037    Education Details  PT given sheet if he is interested in buying an off the shelf compression garment, (facioplasty by [employer])>     Person(s) Educated  Patient    Methods  Explanation;Handout    Comprehension  Verbalized understanding  PT Short Term Goals - 06/20/18 1044      PT SHORT TERM GOAL #1   Title  Pt pain to be no greater than a 5/10 to allow pt to turn his head to at least 50 degrees in  both directions  to assist in safety while driving    Time  3    Period  Weeks    Status  Achieved   s      PT SHORT TERM GOAL #2   Title  PT to be able to open his mouth 3 cm to make eating easier.     Time  3   2 weeks form now =    Period  Weeks    Status  Revised   goal 3.0cm    Target Date  07/04/18      PT SHORT TERM GOAL #3   Title  Pt measurements to have improved by .5 cm to allow pt to have improved self image.     Time  2   from reassessment.    Period  Weeks    Status  Revised   exacerbated edema due to surgery       PT Long Term Goals - 06/20/18 1045      PT LONG TERM GOAL #1   Title  PT to be able to turn his head 60 degrees or more in both directions to be able to see blind spot while driving    Time  6   now 10 weeks    Period  Weeks    Status  On-going    Target Date  07/18/18      PT LONG TERM GOAL #2   Title  PT measurement to be decreased 1. cm to demonstrate decreased facial and cervical edema to improve swallowing.     Time  6   now 10 weeks    Period  Weeks    Status  Not Met   exacerbated since surgery      PT LONG TERM GOAL #3   Title  PT to be wearing his compression for 20 mintues 2x a day at least 5 days a week to keep edema down.     Time  6   now 10 weeks    Period  Weeks    Status  Not Met   PT stopped using due to surgery.      PT LONG TERM GOAL #4   Title  Pt to be able to demonstrate self massaging techniques to increase lymphatic flow to decrease swelling.     Time  6    Period  Weeks    Status  Achieved            Plan - 06/20/18 1039    Clinical Impression Statement  PT had a stent placed into his Rt carotid on 05/31/2018.  He has been cleared by his cardiac surgeon to return to treatment.  Pt has increased tightness and edema as expected.  PT has not been doing any manual or exercises at home to allow the healing from the stent surgery.  PT will now benefit from extended skilled physical therapy care to work on  reducing facial and cervical edema to improve self image, clearity of speech and swallowing.     Rehab Potential  Good    PT Frequency  3x / week    PT Duration  --   additional 4 weeks.  Pt had 6 weeks of therapy, had  a stent placed which has exacerbated the edema.    PT Treatment/Interventions  ADLs/Self Care Home Management;Therapeutic exercise;Patient/family education;Manual techniques;Compression bandaging    PT Next Visit Plan  Begin swallowing exercises once again.  Continue with total decongestive techniques to reduce cervical and facial edema.    PT Home Exercise Plan  cervical ROM ; thoracic decongestion.     Consulted and Agree with Plan of Care  Patient       Patient will benefit from skilled therapeutic intervention in order to improve the following deficits and impairments:  Decreased activity tolerance, Decreased scar mobility, Decreased range of motion, Decreased mobility, Pain, Postural dysfunction, Increased edema  Visit Diagnosis: Lymphedema, not elsewhere classified     Problem List Patient Active Problem List   Diagnosis Date Noted  . Attention to G-tube ([place]) 02/08/2018  . UTI (urinary tract infection) 04/20/2017  . UTI due to Klebsiella species 04/20/2017  . Essential hypertension 04/20/2017  . Chronic pancreatitis ([place]) 04/20/2017  . 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 Malnutrition of moderate degree 04/20/2017  . Complicated UTI (urinary tract infection)   . 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 Rectal bleeding 09/28/2016  . Hyponatremia 04/28/2015  . Abdominal pain 04/27/2015  . Acute pancreatitis 04/27/2015  . Chronic alcoholic pancreatitis ([place]) 04/27/2015  . ETOH abuse 04/27/2015  . DM type 2 (diabetes mellitus, type 2) ([place]) 04/27/2015  . Hypertension, uncontrolled 04/27/2015  . Hyperlipidemia 04/27/2015  . Hypertensive urgency 04/27/2015  . Alcohol abuse   Rayetta Humphrey, [place] CLT [phone] 06/20/2018, 10:51  AM  [place], [place], [address] Phone: [phone]   Fax:  [phone]  Name: Colin Wagner MRN: [id number] Date of Birth: 1951-02-16

## 2018-06-23 HISTORY — PX: CAROTID STENT: SHX1301

## 2018-06-28 ENCOUNTER — Other Ambulatory Visit: Payer: Self-pay

## 2018-06-28 ENCOUNTER — Telehealth (HOSPITAL_COMMUNITY): Payer: Self-pay | Admitting: Physical Therapy

## 2018-06-28 ENCOUNTER — Ambulatory Visit (HOSPITAL_COMMUNITY): Payer: Medicare Other | Attending: Radiation Oncology | Admitting: Physical Therapy

## 2018-06-28 DIAGNOSIS — I890 Lymphedema, not elsewhere classified: Secondary | ICD-10-CM | POA: Diagnosis not present

## 2018-06-28 NOTE — Telephone Encounter (Signed)
Pt will let us know what date he can see CR during Thanksgiving tomorro. NF 06/28/18 Pt can only see CINDY-Called pt and he agreed to come see CR tomorrow @ 11:15am . NF 06/28/18

## 2018-06-28 NOTE — Therapy (Signed)
[place], [place], [address] Phone: [phone]   Fax:  [phone]  Physical Therapy Treatment  Patient Details  Name: Colin Wagner MRN: [id number] Date of Birth: 1950-10-01 Referring Provider (PT): [name]    Encounter Date: 06/28/2018  PT End of Session - 06/28/18 1139    Visit Number  19    Number of Visits  30    Date for PT Re-Evaluation  07/20/18    PT Start Time  0945    PT Stop Time  1025    PT Time Calculation (min)  40 min    Activity Tolerance  Patient tolerated treatment well    Behavior During Therapy  [facility] for tasks assessed/performed       Past Medical History:  Diagnosis Date  . Alcohol abuse   . Alcoholic pancreatitis 1497   admission  . Chronic back pain   . Chronic pancreatitis ([place])    based on ct findings 2016  . COPD (chronic obstructive pulmonary disease) ([place])   . Diabetes mellitus   . Diverticulosis   . Gastritis   . GERD (gastroesophageal reflux disease)   . Headache   . History of radiation therapy 05/12/17- 06/22/17   Left cheek and bilateral neck/ 60 Gy in 30 fractions to gross disease  . Hyperlipidemia   . Hypertension   . Peptic ulcer disease 1999   Per medical reports, no H pylori  . Renal cancer, left ([place]) 2012   he tells me that he has been released, ?and that he is free of cancer, and never had it to begin with.   . Right shoulder pain   . Testicular hypofunction     Past Surgical History:  Procedure Laterality Date  . COLONOSCOPY  2003   Dr. [name], polyps  . COLONOSCOPY  2005   Dr. [name], multiple diverticula  . COLONOSCOPY  2008   Dr. [name], diverticulosis  . COLONOSCOPY WITH PROPOFOL N/A 10/21/2016   Dr. [name]: Diverticulosis, two 5-7 mm polyps removed. path-tubular adenomas.  Next colonoscopy in 5 years.  . ESOPHAGOGASTRODUODENOSCOPY     Multiple EGDs. 1999 EGD showed gastric ulcers, no H pylori and benign biopsies performed by  Dr. [name]. 2001 gastric ulcer healed. Last EGD 2005 had gastritis.  . IR REPLC GASTRO/COLONIC TUBE PERCUT W/FLUORO  05/16/2018  . Left partial mandibulectomy, Scapular free flap reconstruction, selective neck dissection, tracheotomy, and resection of intraoral palate cancer. Left 03/01/2017   [facility]  . LUNG BIOPSY    . PARTIAL NEPHRECTOMY Left 2012  . POLYPECTOMY  10/21/2016   Procedure: POLYPECTOMY;  Surgeon: [name], MD;  Location[facility];  Service: Endoscopy;;  hepatic flexure x2    There were no vitals filed for this visit.  Subjective Assessment - 06/28/18 1137    Subjective  Pt states that he feels tight. No real pain .    Pertinent History  renal cancer, Cancer of Lt mandible with LT partial mandibulectomy on 03/01/2017, DM ,HTN     Currently in Pain?  No/denies    Pain Onset  More than a month ago                       [facility] Adult PT Treatment/Exercise - 06/28/18 0001      Exercises   Exercises  Neck      Neck Exercises: Seated   Other Seated Exercise  bulldog up to right/ up to left x 5; swallowing exercises tongue to roof of mouth swallow; bite tongue swallow x 5 each.     Other Seated Exercise  cervical rotation B;Long low "eee"; long high "eee"      Manual Therapy   Manual Therapy  Myofascial release;Manual Lymphatic Drainage (MLD);Compression Bandaging;Taping    Manual therapy comments  completed seperate from all other aspects of treatment     Myofascial Release  for scar under mandibular.     Manual Lymphatic Drainage (MLD)   cervical and facial to include internal mouth,  lip and eye area.     Compression Bandaging  --               PT Short Term Goals - 06/20/18 1044      PT SHORT TERM GOAL #1   Title  Pt pain to be no greater than a 5/10 to allow pt to turn his head to at least 50 degrees in both directions  to assist in safety while driving    Time  3    Period  Weeks    Status  Achieved    s      PT SHORT TERM GOAL #2   Title  PT to be able to open his mouth 3 cm to make eating easier.     Time  3   2 weeks form now =    Period  Weeks    Status  Revised   goal 3.0cm    Target Date  07/04/18      PT SHORT TERM GOAL #3   Title  Pt measurements to have improved by .5 cm to allow pt to have improved self image.     Time  2   from reassessment.    Period  Weeks    Status  Revised   exacerbated edema due to surgery       PT Long Term Goals - 06/20/18 1045      PT LONG TERM GOAL #1   Title  PT to be able to turn his head 60 degrees or more in both directions to be able to see blind spot while driving    Time  6   now 10 weeks    Period  Weeks    Status  On-going    Target Date  07/18/18      PT LONG TERM GOAL #2   Title  PT measurement to be decreased 1. cm to demonstrate decreased facial and cervical edema to improve swallowing.     Time  6   now 10 weeks    Period  Weeks    Status  Not Met   exacerbated since surgery      PT LONG TERM GOAL #3   Title  PT to be wearing his compression for 20 mintues 2x a day at least 5 days a week to keep edema down.     Time  6   now 10 weeks    Period  Weeks    Status  Not Met   PT stopped using due to surgery.      PT LONG TERM GOAL #4   Title  Pt to be able to demonstrate self massaging techniques to increase lymphatic flow to decrease swelling.     Time  6    Period  Weeks    Status  Achieved            Plan - 06/28/18  1140    Clinical Impression Statement  First full treatment since pt had stent on Rt carotid artery.  Pt has noted induration at submental and jaw line.  Manual completed to trapezius mm B due to extreme tightness.  PT will contintue to benefit from skilled PT to decrease edema.     Rehab Potential  Good    PT Frequency  3x / week    PT Duration  --   additional 4 weeks.  Pt had 6 weeks of therapy, had a stent placed which has exacerbated the edema.    PT Treatment/Interventions   ADLs/Self Care Home Management;Therapeutic exercise;Patient/family education;Manual techniques;Compression bandaging    PT Next Visit Plan  Visit 20 progress note with comparison to visit 10. See of pt ordered off the shelf compression.    Continue with total decongestive techniques to reduce cervical and facial edema.    PT Home Exercise Plan  cervical ROM ; thoracic decongestion.     Consulted and Agree with Plan of Care  Patient       Patient will benefit from skilled therapeutic intervention in order to improve the following deficits and impairments:  Decreased activity tolerance, Decreased scar mobility, Decreased range of motion, Decreased mobility, Pain, Postural dysfunction, Increased edema  Visit Diagnosis: Lymphedema, not elsewhere classified     Problem List Patient Active Problem List   Diagnosis Date Noted  . Attention to G-tube ([place]) 02/08/2018  . UTI (urinary tract infection) 04/20/2017  . UTI due to Klebsiella species 04/20/2017  . Essential hypertension 04/20/2017  . Chronic pancreatitis ([place]) 04/20/2017  . 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 Malnutrition of moderate degree 04/20/2017  . Complicated UTI (urinary tract infection)   . 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 Rectal bleeding 09/28/2016  . Hyponatremia 04/28/2015  . Abdominal pain 04/27/2015  . Acute pancreatitis 04/27/2015  . Chronic alcoholic pancreatitis ([place]) 04/27/2015  . ETOH abuse 04/27/2015  . DM type 2 (diabetes mellitus, type 2) ([place]) 04/27/2015  . Hypertension, uncontrolled 04/27/2015  . Hyperlipidemia 04/27/2015  . Hypertensive urgency 04/27/2015  . Alcohol abuse     Rayetta Humphrey, [place] CLT [phone] 06/28/2018, 11:42 AM  [place], [place], [address] Phone: [phone]   Fax:  [phone]  Name: Colin Wagner MRN: [id number] Date of Birth: 01/29/1951

## 2018-06-29 ENCOUNTER — Ambulatory Visit (HOSPITAL_COMMUNITY): Payer: Medicare Other | Admitting: Physical Therapy

## 2018-06-29 DIAGNOSIS — I890 Lymphedema, not elsewhere classified: Secondary | ICD-10-CM

## 2018-06-29 NOTE — Therapy (Signed)
[facility] [address] [place], [place], [address] Phone: [phone]   Fax:  [phone]  Physical Therapy Treatment  Patient Details  Name: Colin Wagner MRN: [id number] Date of Birth: 09/12/1950 Referring Provider (PT): [name]   Progress Note Reporting Period 05/10/2018  to 06/29/2018  See note below for Objective Data and Assessment of Progress/Goals.      Encounter Date: 06/29/2018  PT End of Session - 06/29/18 1206    Visit Number  20    Number of Visits  30    Date for PT Re-Evaluation  07/20/18    PT Start Time  1120    PT Stop Time  1200    PT Time Calculation (min)  40 min    Activity Tolerance  Patient tolerated treatment well    Behavior During Therapy  [facility] for tasks assessed/performed       Past Medical History:  Diagnosis Date  . Alcohol abuse   . Alcoholic pancreatitis 4656   admission  . Chronic back pain   . Chronic pancreatitis ([place])    based on ct findings 2016  . COPD (chronic obstructive pulmonary disease) ([place])   . Diabetes mellitus   . Diverticulosis   . Gastritis   . GERD (gastroesophageal reflux disease)   . Headache   . History of radiation therapy 05/12/17- 06/22/17   Left cheek and bilateral neck/ 60 Gy in 30 fractions to gross disease  . Hyperlipidemia   . Hypertension   . Peptic ulcer disease 1999   Per medical reports, no H pylori  . Renal cancer, left ([place]) 2012   he tells me that he has been released, ?and that he is free of cancer, and never had it to begin with.   . Right shoulder pain   . Testicular hypofunction     Past Surgical History:  Procedure Laterality Date  . COLONOSCOPY  2003   Dr. [name], polyps  . COLONOSCOPY  2005   Dr. [name], multiple diverticula  . COLONOSCOPY  2008   Dr. [name], diverticulosis  . COLONOSCOPY WITH PROPOFOL N/A 10/21/2016   Dr. [name]: Diverticulosis, two 5-7 mm polyps removed. path-tubular adenomas.  Next colonoscopy in 5  years.  . ESOPHAGOGASTRODUODENOSCOPY     Multiple EGDs. 1999 EGD showed gastric ulcers, no H pylori and benign biopsies performed by Dr. [name]. 2001 gastric ulcer healed. Last EGD 2005 had gastritis.  . IR REPLC GASTRO/COLONIC TUBE PERCUT W/FLUORO  05/16/2018  . Left partial mandibulectomy, Scapular free flap reconstruction, selective neck dissection, tracheotomy, and resection of intraoral palate cancer. Left 03/01/2017   [facility]  . LUNG BIOPSY    . PARTIAL NEPHRECTOMY Left 2012  . POLYPECTOMY  10/21/2016   Procedure: POLYPECTOMY;  Surgeon: [name], MD;  [facility];  Service: Endoscopy;;  hepatic flexure x2    There were no vitals filed for this visit.      [facility] PT Assessment - 06/29/18 0001      AROM   Cervical Flexion  55   10th visit 50   Cervical Extension  30   10 th visit 44   Cervical - Right Side Bend  20   10th visit 22   Cervical - Left Side Bend  30   10th viist 32   Cervical - Right Rotation  45   50   Cervical - Left Rotation  50   50  LYMPHEDEMA/ONCOLOGY QUESTIONNAIRE - 06/29/18 1122      Head and Neck   Right Lateral Nostril at base of nose to medial tragus   11 cm   10th visit 10.3   Left Lateral Nostril at base of nose to medial tragus   11.5 cm   11   Right Corner of mouth to where ear lobe meets face  10.5 cm   9.8   Left Corner of mouth to where ear lobe meets face  12 cm   11.2               [facility] Adult PT Treatment/Exercise - 06/29/18 0001      Manual Therapy   Manual Therapy  Myofascial release;Manual Lymphatic Drainage (MLD);Compression Bandaging;Taping    Manual therapy comments  completed seperate from all other aspects of treatment     Myofascial Release  for scar under mandibular.     Manual Lymphatic Drainage (MLD)   cervical and facial to include internal mouth,  lip and eye area.     Compression Bandaging  measurement.                PT Short Term Goals  - 06/29/18 1212      PT SHORT TERM GOAL #1   Title  Pt pain to be no greater than a 5/10 to allow pt to turn his head to at least 50 degrees in both directions  to assist in safety while driving    Time  3    Period  Weeks    Status  Partially Met   s      PT SHORT TERM GOAL #2   Title  PT to be able to open his mouth 3 cm to make eating easier.     Time  3   2 weeks form now =    Period  Weeks    Status  Revised   goal 3.0cm      PT SHORT TERM GOAL #3   Title  Pt measurements to have improved by .5 cm to allow pt to have improved self image.     Time  2   from reassessment.    Period  Weeks    Status  On-going   exacerbated edema due to surgery       PT Long Term Goals - 06/29/18 1212      PT LONG TERM GOAL #1   Title  PT to be able to turn his head 60 degrees or more in both directions to be able to see blind spot while driving    Time  6   now 10 weeks    Period  Weeks    Status  On-going      PT LONG TERM GOAL #2   Title  PT measurement to be decreased 1. cm to demonstrate decreased facial and cervical edema to improve swallowing.     Time  6   now 10 weeks    Period  Weeks    Status  Not Met   exacerbated since surgery      PT LONG TERM GOAL #3   Title  PT to be wearing his compression for 20 mintues 2x a day at least 5 days a week to keep edema down.     Time  6   now 10 weeks    Period  Weeks    Status  On-going   PT stopped using due to surgery but has started back.  PT LONG TERM GOAL #4   Title  Pt to be able to demonstrate self massaging techniques to increase lymphatic flow to decrease swelling.     Time  6    Period  Weeks    Status  Achieved            Plan - 06/29/18 1209    Clinical Impression Statement  Pt reassessed for 20th day progress note.  Pt had a three week pause of therapy due to having to have a right carotid endarectomy with stent placement.  PT was not completing and ROM or manual during this healing time.  This is his  second full visit since the surgery with noted decline in ROM as well as increased volumes.  PT will continue to benefit from manual decongestive techniques to reduce his volume to the lowest they were prior to surgery and improve ROM>     Rehab Potential  Good    PT Frequency  3x / week    PT Duration  --   additional 4 weeks.  Pt had 6 weeks of therapy, had a stent placed which has exacerbated the edema.    PT Treatment/Interventions  ADLs/Self Care Home Management;Therapeutic exercise;Patient/family education;Manual techniques;Compression bandaging    PT Next Visit Plan     Continue with total decongestive techniques to reduce cervical and facial edema.    PT Home Exercise Plan  cervical ROM ; thoracic decongestion.     Consulted and Agree with Plan of Care  Patient       Patient will benefit from skilled therapeutic intervention in order to improve the following deficits and impairments:  Decreased activity tolerance, Decreased scar mobility, Decreased range of motion, Decreased mobility, Pain, Postural dysfunction, Increased edema  Visit Diagnosis: Lymphedema, not elsewhere classified     Problem List Patient Active Problem List   Diagnosis Date Noted  . Attention to G-tube ([place]) 02/08/2018  . UTI (urinary tract infection) 04/20/2017  . UTI due to Klebsiella species 04/20/2017  . Essential hypertension 04/20/2017  . Chronic pancreatitis ([place]) 04/20/2017  . 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 Malnutrition of moderate degree 04/20/2017  . Complicated UTI (urinary tract infection)   . 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 Rectal bleeding 09/28/2016  . Hyponatremia 04/28/2015  . Abdominal pain 04/27/2015  . Acute pancreatitis 04/27/2015  . Chronic alcoholic pancreatitis ([place]) 04/27/2015  . ETOH abuse 04/27/2015  . DM type 2 (diabetes mellitus, type 2) ([place]) 04/27/2015  . Hypertension, uncontrolled 04/27/2015  . Hyperlipidemia 04/27/2015  .  Hypertensive urgency 04/27/2015  . Alcohol abuse    Rayetta Humphrey, [place] CLT [phone] 06/29/2018, 12:13 PM  [facility], [place], [address] Phone: [phone]   Fax:  [phone]  Name: Colin Wagner MRN: [id number] Date of Birth: 01/17/1951

## 2018-06-30 ENCOUNTER — Ambulatory Visit (HOSPITAL_COMMUNITY): Payer: Medicare Other | Admitting: Physical Therapy

## 2018-07-03 ENCOUNTER — Ambulatory Visit (HOSPITAL_COMMUNITY): Payer: Medicare Other | Admitting: Physical Therapy

## 2018-07-03 DIAGNOSIS — I890 Lymphedema, not elsewhere classified: Secondary | ICD-10-CM | POA: Diagnosis not present

## 2018-07-03 NOTE — Therapy (Signed)
[place], [place], [address] Phone: [phone]   Fax:  [phone]  Physical Therapy Treatment  Patient Details  Name: Colin Wagner MRN: [id number] Date of Birth: 11-12-1950 Referring Provider (PT): [name]    Encounter Date: 07/03/2018  PT End of Session - 07/03/18 1217    Visit Number  21    Number of Visits  30    Date for PT Re-Evaluation  07/20/18    Authorization - Visit Number  21    Authorization - Number of Visits  30    PT Start Time  1120    PT Stop Time  1205    PT Time Calculation (min)  45 min    Activity Tolerance  Patient tolerated treatment well    Behavior During Therapy  [facility] for tasks assessed/performed       Past Medical History:  Diagnosis Date  . Alcohol abuse   . Alcoholic pancreatitis 8270   admission  . Chronic back pain   . Chronic pancreatitis ([place])    based on ct findings 2016  . COPD (chronic obstructive pulmonary disease) ([place])   . Diabetes mellitus   . Diverticulosis   . Gastritis   . GERD (gastroesophageal reflux disease)   . Headache   . History of radiation therapy 05/12/17- 06/22/17   Left cheek and bilateral neck/ 60 Gy in 30 fractions to gross disease  . Hyperlipidemia   . Hypertension   . Peptic ulcer disease 1999   Per medical reports, no H pylori  . Renal cancer, left ([place]) 2012   he tells me that he has been released, ?and that he is free of cancer, and never had it to begin with.   . Right shoulder pain   . Testicular hypofunction     Past Surgical History:  Procedure Laterality Date  . COLONOSCOPY  2003   Dr. [name], polyps  . COLONOSCOPY  2005   Dr. [name], multiple diverticula  . COLONOSCOPY  2008   Dr. [name], diverticulosis  . COLONOSCOPY WITH PROPOFOL N/A 10/21/2016   Dr. [name]: Diverticulosis, two 5-7 mm polyps removed. path-tubular adenomas.  Next colonoscopy in 5 years.  . ESOPHAGOGASTRODUODENOSCOPY     Multiple EGDs.  1999 EGD showed gastric ulcers, no H pylori and benign biopsies performed by Dr. [name]. 2001 gastric ulcer healed. Last EGD 2005 had gastritis.  . IR REPLC GASTRO/COLONIC TUBE PERCUT W/FLUORO  05/16/2018  . Left partial mandibulectomy, Scapular free flap reconstruction, selective neck dissection, tracheotomy, and resection of intraoral palate cancer. Left 03/01/2017   [facility]  . LUNG BIOPSY    . PARTIAL NEPHRECTOMY Left 2012  . POLYPECTOMY  10/21/2016   Procedure: POLYPECTOMY;  Surgeon: [name], MD;  Location[facility];  Service: Endoscopy;;  hepatic flexure x2    There were no vitals filed for this visit.  Subjective Assessment - 07/03/18 1214    Subjective  PT states that he has noticed that he is getting more swelling in his eye area.    Pertinent History  renal cancer, Cancer of Lt mandible with LT partial mandibulectomy on 03/01/2017, DM ,HTN     Currently in Pain?  Yes    Pain Score  4     Pain Location  Neck    Pain Orientation  Left    Pain Descriptors / Indicators  Aching    Pain Type  Chronic pain  Pain Onset  More than a month ago    Pain Frequency  Intermittent    Aggravating Factors   turning     Pain Relieving Factors  rest    Effect of Pain on Daily Activities  none                       [facility] Adult PT Treatment/Exercise - 07/03/18 0001      Exercises   Exercises  Neck      Manual Therapy   Manual Therapy  Myofascial release;Manual Lymphatic Drainage (MLD);Compression Bandaging;Muscle Energy Technique;Taping    Manual therapy comments  completed seperate from all other aspects of treatment     Myofascial Release  for scar under mandibular.     Manual Lymphatic Drainage (MLD)   cervical and facial to include internal mouth,  lip and eye area.     Compression Bandaging  --    Muscle Energy Technique  contract relax to improve rotation B              PT Education - 07/03/18 1217    Education  Details  reviewed self massage techniques under eyes.     Person(s) Educated  Patient    Methods  Explanation    Comprehension  Verbalized understanding       PT Short Term Goals - 06/29/18 1212      PT SHORT TERM GOAL #1   Title  Pt pain to be no greater than a 5/10 to allow pt to turn his head to at least 50 degrees in both directions  to assist in safety while driving    Time  3    Period  Weeks    Status  Partially Met   s      PT SHORT TERM GOAL #2   Title  PT to be able to open his mouth 3 cm to make eating easier.     Time  3   2 weeks form now =    Period  Weeks    Status  Revised   goal 3.0cm      PT SHORT TERM GOAL #3   Title  Pt measurements to have improved by .5 cm to allow pt to have improved self image.     Time  2   from reassessment.    Period  Weeks    Status  On-going   exacerbated edema due to surgery       PT Long Term Goals - 06/29/18 1212      PT LONG TERM GOAL #1   Title  PT to be able to turn his head 60 degrees or more in both directions to be able to see blind spot while driving    Time  6   now 10 weeks    Period  Weeks    Status  On-going      PT LONG TERM GOAL #2   Title  PT measurement to be decreased 1. cm to demonstrate decreased facial and cervical edema to improve swallowing.     Time  6   now 10 weeks    Period  Weeks    Status  Not Met   exacerbated since surgery      PT LONG TERM GOAL #3   Title  PT to be wearing his compression for 20 mintues 2x a day at least 5 days a week to keep edema down.     Time  6  now 10 weeks    Period  Weeks    Status  On-going   PT stopped using due to surgery but has started back.      PT LONG TERM GOAL #4   Title  Pt to be able to demonstrate self massaging techniques to increase lymphatic flow to decrease swelling.     Time  6    Period  Weeks    Status  Achieved            Plan - 07/03/18 1218    Clinical Impression Statement  PT with complaint and noted increased edema  under left eye.  Therapist went over selfmanual techniques here stressing that pt needs to complete this with a gentle touch; pt verbalized understanding.     Rehab Potential  Good    PT Frequency  3x / week    PT Duration  --   additional 4 weeks.  Pt had 6 weeks of therapy, had a stent placed which has exacerbated the edema.    PT Treatment/Interventions  ADLs/Self Care Home Management;Therapeutic exercise;Patient/family education;Manual techniques;Compression bandaging    PT Next Visit Plan     Continue with total decongestive techniques to reduce cervical and facial edema.    PT Home Exercise Plan  cervical ROM ; thoracic decongestion.     Consulted and Agree with Plan of Care  Patient       Patient will benefit from skilled therapeutic intervention in order to improve the following deficits and impairments:  Decreased activity tolerance, Decreased scar mobility, Decreased range of motion, Decreased mobility, Pain, Postural dysfunction, Increased edema  Visit Diagnosis: Lymphedema, not elsewhere classified     Problem List Patient Active Problem List   Diagnosis Date Noted  . Attention to G-tube ([place]) 02/08/2018  . UTI (urinary tract infection) 04/20/2017  . UTI due to Klebsiella species 04/20/2017  . Essential hypertension 04/20/2017  . Chronic pancreatitis ([place]) 04/20/2017  . 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 Malnutrition of moderate degree 04/20/2017  . Complicated UTI (urinary tract infection)   . 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 Rectal bleeding 09/28/2016  . Hyponatremia 04/28/2015  . Abdominal pain 04/27/2015  . Acute pancreatitis 04/27/2015  . Chronic alcoholic pancreatitis ([place]) 04/27/2015  . ETOH abuse 04/27/2015  . DM type 2 (diabetes mellitus, type 2) ([place]) 04/27/2015  . Hypertension, uncontrolled 04/27/2015  . Hyperlipidemia 04/27/2015  . Hypertensive urgency 04/27/2015  . Alcohol abuse    Rayetta Humphrey, [place]  CLT [phone] 07/03/2018, 12:20 PM  [place], [place], [address] Phone: [phone]   Fax:  [phone]  Name: Colin Wagner MRN: [id number] Date of Birth: [birth year]

## 2018-07-05 ENCOUNTER — Ambulatory Visit (HOSPITAL_COMMUNITY): Payer: Medicare Other | Admitting: Physical Therapy

## 2018-07-05 DIAGNOSIS — I890 Lymphedema, not elsewhere classified: Secondary | ICD-10-CM | POA: Diagnosis not present

## 2018-07-05 NOTE — Therapy (Signed)
[place], [place], [address] Phone: [phone]   Fax:  [phone]  Physical Therapy Treatment  Patient Details  Name: Colin Wagner MRN: [id number] Date of Birth: 17-Jun-1951 Referring Provider (PT): [name]    Encounter Date: 07/05/2018  PT End of Session - 07/05/18 1206    Visit Number  22    Number of Visits  30    Date for PT Re-Evaluation  07/20/18    Authorization - Visit Number  70    Authorization - Number of Visits  30    PT Start Time  1110    PT Stop Time  1200    PT Time Calculation (min)  50 min    Activity Tolerance  Patient tolerated treatment well    Behavior During Therapy  [facility] for tasks assessed/performed       Past Medical History:  Diagnosis Date  . Alcohol abuse   . Alcoholic pancreatitis 9417   admission  . Chronic back pain   . Chronic pancreatitis ([place])    based on ct findings 2016  . COPD (chronic obstructive pulmonary disease) ([place])   . Diabetes mellitus   . Diverticulosis   . Gastritis   . GERD (gastroesophageal reflux disease)   . Headache   . History of radiation therapy 05/12/17- 06/22/17   Left cheek and bilateral neck/ 60 Gy in 30 fractions to gross disease  . Hyperlipidemia   . Hypertension   . Peptic ulcer disease 1999   Per medical reports, no H pylori  . Renal cancer, left ([place]) 2012   he tells me that he has been released, ?and that he is free of cancer, and never had it to begin with.   . Right shoulder pain   . Testicular hypofunction     Past Surgical History:  Procedure Laterality Date  . COLONOSCOPY  2003   Dr. [name], polyps  . COLONOSCOPY  2005   Dr. [name], multiple diverticula  . COLONOSCOPY  2008   Dr. [name], diverticulosis  . COLONOSCOPY WITH PROPOFOL N/A 10/21/2016   Dr. [name]: Diverticulosis, two 5-7 mm polyps removed. path-tubular adenomas.  Next colonoscopy in 5 years.  . ESOPHAGOGASTRODUODENOSCOPY     Multiple EGDs.  1999 EGD showed gastric ulcers, no H pylori and benign biopsies performed by Dr. [name]. 2001 gastric ulcer healed. Last EGD 2005 had gastritis.  . IR REPLC GASTRO/COLONIC TUBE PERCUT W/FLUORO  05/16/2018  . Left partial mandibulectomy, Scapular free flap reconstruction, selective neck dissection, tracheotomy, and resection of intraoral palate cancer. Left 03/01/2017   [facility]  . LUNG BIOPSY    . PARTIAL NEPHRECTOMY Left 2012  . POLYPECTOMY  10/21/2016   Procedure: POLYPECTOMY;  Surgeon: [name], MD;  [facility];  Service: Endoscopy;;  hepatic flexure x2    There were no vitals filed for this visit.  Subjective Assessment - 07/05/18 1204    Subjective  PT states he is not sure why but he is having more pain in his left jaw, states it feels like a toothache.    Pertinent History  renal cancer, Cancer of Lt mandible with LT partial mandibulectomy on 03/01/2017, DM ,HTN     Currently in Pain?  Yes    Pain Score  4     Pain Location  Jaw    Pain Orientation  Left    Pain Descriptors / Indicators  Aching  Pain Onset  More than a month ago                       [facility] Adult PT Treatment/Exercise - 07/05/18 0001      Exercises   Exercises  Neck      Manual Therapy   Manual Therapy  Myofascial release;Manual Lymphatic Drainage (MLD);Compression Bandaging;Muscle Energy Technique;Taping    Manual therapy comments  completed seperate from all other aspects of treatment     Myofascial Release  for scar under mandibular.     Manual Lymphatic Drainage (MLD)   cervical and facial to include internal mouth,  lip and eye area.     Compression Bandaging  new foam cut for compression to LT jaw area.     Muscle Energy Technique  contract relax to improve rotation B              PT Education - 07/05/18 1208    Education Details  review of self manual techniques        PT Short Term Goals - 06/29/18 1212      PT SHORT TERM  GOAL #1   Title  Pt pain to be no greater than a 5/10 to allow pt to turn his head to at least 50 degrees in both directions  to assist in safety while driving    Time  3    Period  Weeks    Status  Partially Met   s      PT SHORT TERM GOAL #2   Title  PT to be able to open his mouth 3 cm to make eating easier.     Time  3   2 weeks form now =    Period  Weeks    Status  Revised   goal 3.0cm      PT SHORT TERM GOAL #3   Title  Pt measurements to have improved by .5 cm to allow pt to have improved self image.     Time  2   from reassessment.    Period  Weeks    Status  On-going   exacerbated edema due to surgery       PT Long Term Goals - 06/29/18 1212      PT LONG TERM GOAL #1   Title  PT to be able to turn his head 60 degrees or more in both directions to be able to see blind spot while driving    Time  6   now 10 weeks    Period  Weeks    Status  On-going      PT LONG TERM GOAL #2   Title  PT measurement to be decreased 1. cm to demonstrate decreased facial and cervical edema to improve swallowing.     Time  6   now 10 weeks    Period  Weeks    Status  Not Met   exacerbated since surgery      PT LONG TERM GOAL #3   Title  PT to be wearing his compression for 20 mintues 2x a day at least 5 days a week to keep edema down.     Time  6   now 10 weeks    Period  Weeks    Status  On-going   PT stopped using due to surgery but has started back.      PT LONG TERM GOAL #4   Title  Pt to be able  to demonstrate self massaging techniques to increase lymphatic flow to decrease swelling.     Time  6    Period  Weeks    Status  Achieved            Plan - 07/05/18 1206    Clinical Impression Statement  Therapist cut new foam for compression for pt to use at home.  Pt vocalized decreased pain to a 0/10 from a 4/10 after manual techniques.  Self manual reviewed and noted that pt was pushing fluid to the face instead of away as wel as using to much pressure.  Pt  vocalized that he understood how he should be completing manual at now.     Rehab Potential  Good    PT Frequency  3x / week    PT Duration  --   additional 4 weeks.  Pt had 6 weeks of therapy, had a stent placed which has exacerbated the edema.    PT Treatment/Interventions  ADLs/Self Care Home Management;Therapeutic exercise;Patient/family education;Manual techniques;Compression bandaging    PT Next Visit Plan  measurement next treatment     PT Home Exercise Plan  cervical ROM ; thoracic decongestion.     Consulted and Agree with Plan of Care  Patient       Patient will benefit from skilled therapeutic intervention in order to improve the following deficits and impairments:  Decreased activity tolerance, Decreased scar mobility, Decreased range of motion, Decreased mobility, Pain, Postural dysfunction, Increased edema  Visit Diagnosis: Lymphedema, not elsewhere classified     Problem List Patient Active Problem List   Diagnosis Date Noted  . Attention to G-tube ([place]) 02/08/2018  . UTI (urinary tract infection) 04/20/2017  . UTI due to Klebsiella species 04/20/2017  . Essential hypertension 04/20/2017  . Chronic pancreatitis ([place]) 04/20/2017  . 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 Malnutrition of moderate degree 04/20/2017  . Complicated UTI (urinary tract infection)   . 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 Rectal bleeding 09/28/2016  . Hyponatremia 04/28/2015  . Abdominal pain 04/27/2015  . Acute pancreatitis 04/27/2015  . Chronic alcoholic pancreatitis ([place]) 04/27/2015  . ETOH abuse 04/27/2015  . DM type 2 (diabetes mellitus, type 2) ([place]) 04/27/2015  . Hypertension, uncontrolled 04/27/2015  . Hyperlipidemia 04/27/2015  . Hypertensive urgency 04/27/2015  . Alcohol abuse     Rayetta Humphrey, [place] CLT [phone] 07/05/2018, 12:08 PM  [place] [address] [place], [place], [address] Phone:  [phone]   Fax:  [phone]  Name: MARVELOUS BOUWENS MRN: [id number] Date of Birth: 22-Jun-1951

## 2018-07-10 ENCOUNTER — Encounter (HOSPITAL_COMMUNITY): Payer: Self-pay | Admitting: Physical Therapy

## 2018-07-10 ENCOUNTER — Ambulatory Visit (HOSPITAL_COMMUNITY): Payer: Medicare Other | Admitting: Physical Therapy

## 2018-07-10 DIAGNOSIS — I890 Lymphedema, not elsewhere classified: Secondary | ICD-10-CM | POA: Diagnosis not present

## 2018-07-10 NOTE — Therapy (Signed)
[place], [place], [address] Phone: [phone]   Fax:  [phone]  Physical Therapy Treatment  Patient Details  Name: Colin Wagner MRN: [id number] Date of Birth: 1951/05/25 Referring Provider (PT): [name]    Encounter Date: 07/10/2018  PT End of Session - 07/10/18 1214    Visit Number  23    Number of Visits  30    Date for PT Re-Evaluation  07/20/18    Authorization - Visit Number  11    Authorization - Number of Visits  30    PT Start Time  7893    PT Stop Time  1115    PT Time Calculation (min)  40 min    Activity Tolerance  Patient tolerated treatment well    Behavior During Therapy  [facility] for tasks assessed/performed       Past Medical History:  Diagnosis Date  . Alcohol abuse   . Alcoholic pancreatitis 8101   admission  . Chronic back pain   . Chronic pancreatitis ([place])    based on ct findings 2016  . COPD (chronic obstructive pulmonary disease) ([place])   . Diabetes mellitus   . Diverticulosis   . Gastritis   . GERD (gastroesophageal reflux disease)   . Headache   . History of radiation therapy 05/12/17- 06/22/17   Left cheek and bilateral neck/ 60 Gy in 30 fractions to gross disease  . Hyperlipidemia   . Hypertension   . Peptic ulcer disease 1999   Per medical reports, no H pylori  . Renal cancer, left ([place]) 2012   he tells me that he has been released, ?and that he is free of cancer, and never had it to begin with.   . Right shoulder pain   . Testicular hypofunction     Past Surgical History:  Procedure Laterality Date  . COLONOSCOPY  2003   Dr. [name], polyps  . COLONOSCOPY  2005   Dr. [name], multiple diverticula  . COLONOSCOPY  2008   Dr. [name], diverticulosis  . COLONOSCOPY WITH PROPOFOL N/A 10/21/2016   Dr. [name]: Diverticulosis, two 5-7 mm polyps removed. path-tubular adenomas.  Next colonoscopy in 5 years.  . ESOPHAGOGASTRODUODENOSCOPY     Multiple EGDs.  1999 EGD showed gastric ulcers, no H pylori and benign biopsies performed by Dr. [name]. 2001 gastric ulcer healed. Last EGD 2005 had gastritis.  . IR REPLC GASTRO/COLONIC TUBE PERCUT W/FLUORO  05/16/2018  . Left partial mandibulectomy, Scapular free flap reconstruction, selective neck dissection, tracheotomy, and resection of intraoral palate cancer. Left 03/01/2017   [facility]  . LUNG BIOPSY    . PARTIAL NEPHRECTOMY Left 2012  . POLYPECTOMY  10/21/2016   Procedure: POLYPECTOMY;  Surgeon: [name], MD;  [facility];  Service: Endoscopy;;  hepatic flexure x2    There were no vitals filed for this visit.  Subjective Assessment - 07/10/18 1212    Subjective  PT states that he used his compression garment,(therapist made) and has been self massaging a few days.     Pertinent History  renal cancer, Cancer of Lt mandible with LT partial mandibulectomy on 03/01/2017, DM ,HTN     Currently in Pain?  Yes    Pain Score  3     Pain Location  Jaw    Pain Orientation  Left    Pain Descriptors / Indicators  Aching    Pain Type  Chronic  pain    Pain Onset  More than a month ago    Pain Frequency  Intermittent    Aggravating Factors   swelling     Pain Relieving Factors  massaging     Effect of Pain on Daily Activities  none                       [facility] Adult PT Treatment/Exercise - 07/10/18 0001      Exercises   Exercises  Neck      Manual Therapy   Manual Therapy  Myofascial release;Manual Lymphatic Drainage (MLD);Compression Bandaging;Muscle Energy Technique;Taping    Manual therapy comments  completed seperate from all other aspects of treatment     Myofascial Release  for scar under mandibular.     Manual Lymphatic Drainage (MLD)   cervical and facial to include internal mouth,  lip and eye area.     Compression Bandaging  --    Muscle Energy Technique  contract relax to improve rotation B                PT Short  Term Goals - 06/29/18 1212      PT SHORT TERM GOAL #1   Title  Pt pain to be no greater than a 5/10 to allow pt to turn his head to at least 50 degrees in both directions  to assist in safety while driving    Time  3    Period  Weeks    Status  Partially Met   s      PT SHORT TERM GOAL #2   Title  PT to be able to open his mouth 3 cm to make eating easier.     Time  3   2 weeks form now =    Period  Weeks    Status  Revised   goal 3.0cm      PT SHORT TERM GOAL #3   Title  Pt measurements to have improved by .5 cm to allow pt to have improved self image.     Time  2   from reassessment.    Period  Weeks    Status  On-going   exacerbated edema due to surgery       PT Long Term Goals - 06/29/18 1212      PT LONG TERM GOAL #1   Title  PT to be able to turn his head 60 degrees or more in both directions to be able to see blind spot while driving    Time  6   now 10 weeks    Period  Weeks    Status  On-going      PT LONG TERM GOAL #2   Title  PT measurement to be decreased 1. cm to demonstrate decreased facial and cervical edema to improve swallowing.     Time  6   now 10 weeks    Period  Weeks    Status  Not Met   exacerbated since surgery      PT LONG TERM GOAL #3   Title  PT to be wearing his compression for 20 mintues 2x a day at least 5 days a week to keep edema down.     Time  6   now 10 weeks    Period  Weeks    Status  On-going   PT stopped using due to surgery but has started back.      PT LONG TERM  GOAL #4   Title  Pt to be able to demonstrate self massaging techniques to increase lymphatic flow to decrease swelling.     Time  6    Period  Weeks    Status  Achieved            Plan - 07/10/18 1214    Clinical Impression Statement  Pt appears to have less eye and high cheek edema states that he has been working on it.  Therapist urged pt to complete self manual everyday not just every other day.      Rehab Potential  Good    PT Frequency  3x /  week    PT Duration  --   additional 4 weeks.  Pt had 6 weeks of therapy, had a stent placed which has exacerbated the edema.    PT Treatment/Interventions  ADLs/Self Care Home Management;Therapeutic exercise;Patient/family education;Manual techniques;Compression bandaging    PT Next Visit Plan  measurement next treatment     PT Home Exercise Plan  cervical ROM ; thoracic decongestion.     Consulted and Agree with Plan of Care  Patient       Patient will benefit from skilled therapeutic intervention in order to improve the following deficits and impairments:  Decreased activity tolerance, Decreased scar mobility, Decreased range of motion, Decreased mobility, Pain, Postural dysfunction, Increased edema  Visit Diagnosis: Lymphedema, not elsewhere classified     Problem List Patient Active Problem List   Diagnosis Date Noted  . Attention to G-tube ([place]) 02/08/2018  . UTI (urinary tract infection) 04/20/2017  . UTI due to Klebsiella species 04/20/2017  . Essential hypertension 04/20/2017  . Chronic pancreatitis ([place]) 04/20/2017  . 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 Malnutrition of moderate degree 04/20/2017  . Complicated UTI (urinary tract infection)   . 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 Rectal bleeding 09/28/2016  . Hyponatremia 04/28/2015  . Abdominal pain 04/27/2015  . Acute pancreatitis 04/27/2015  . Chronic alcoholic pancreatitis ([place]) 04/27/2015  . ETOH abuse 04/27/2015  . DM type 2 (diabetes mellitus, type 2) ([place]) 04/27/2015  . Hypertension, uncontrolled 04/27/2015  . Hyperlipidemia 04/27/2015  . Hypertensive urgency 04/27/2015  . Alcohol abuse   Rayetta Humphrey, [place] CLT [phone] 07/10/2018, 12:16 PM  [place] [address] [place], [place], [address] Phone: [phone]   Fax:  [phone]  Name: KYMANI SHIMABUKURO MRN: [id number] Date of Birth: 06/26/1951

## 2018-07-12 ENCOUNTER — Ambulatory Visit (HOSPITAL_COMMUNITY): Payer: Medicare Other | Admitting: Physical Therapy

## 2018-07-12 ENCOUNTER — Encounter (HOSPITAL_COMMUNITY): Payer: Self-pay | Admitting: Physical Therapy

## 2018-07-12 DIAGNOSIS — I890 Lymphedema, not elsewhere classified: Secondary | ICD-10-CM

## 2018-07-12 NOTE — Therapy (Signed)
[place], [place], [address] Phone: [phone]   Fax:  [phone]  Physical Therapy Treatment  Patient Details  Name: Colin Wagner MRN: [id number] Date of Birth: 1951-05-30 Referring Provider (PT): [name]    Encounter Date: 07/12/2018  PT End of Session - 07/12/18 1050    Visit Number  24    Number of Visits  30    Date for PT Re-Evaluation  07/20/18    Authorization - Visit Number  24    Authorization - Number of Visits  30    PT Start Time  1000    PT Stop Time  1043    PT Time Calculation (min)  43 min    Activity Tolerance  Patient tolerated treatment well    Behavior During Therapy  [facility] for tasks assessed/performed       Past Medical History:  Diagnosis Date  . Alcohol abuse   . Alcoholic pancreatitis 2694   admission  . Chronic back pain   . Chronic pancreatitis ([place])    based on ct findings 2016  . COPD (chronic obstructive pulmonary disease) ([place])   . Diabetes mellitus   . Diverticulosis   . Gastritis   . GERD (gastroesophageal reflux disease)   . Headache   . History of radiation therapy 05/12/17- 06/22/17   Left cheek and bilateral neck/ 60 Gy in 30 fractions to gross disease  . Hyperlipidemia   . Hypertension   . Peptic ulcer disease 1999   Per medical reports, no H pylori  . Renal cancer, left ([place]) 2012   he tells me that he has been released, ?and that he is free of cancer, and never had it to begin with.   . Right shoulder pain   . Testicular hypofunction     Past Surgical History:  Procedure Laterality Date  . COLONOSCOPY  2003   Dr. [name], polyps  . COLONOSCOPY  2005   Dr. [name], multiple diverticula  . COLONOSCOPY  2008   Dr. [name], diverticulosis  . COLONOSCOPY WITH PROPOFOL N/A 10/21/2016   Dr. [name]: Diverticulosis, two 5-7 mm polyps removed. path-tubular adenomas.  Next colonoscopy in 5 years.  . ESOPHAGOGASTRODUODENOSCOPY     Multiple EGDs.  1999 EGD showed gastric ulcers, no H pylori and benign biopsies performed by Dr. [name]. 2001 gastric ulcer healed. Last EGD 2005 had gastritis.  . IR REPLC GASTRO/COLONIC TUBE PERCUT W/FLUORO  05/16/2018  . Left partial mandibulectomy, Scapular free flap reconstruction, selective neck dissection, tracheotomy, and resection of intraoral palate cancer. Left 03/01/2017   [facility]  . LUNG BIOPSY    . PARTIAL NEPHRECTOMY Left 2012  . POLYPECTOMY  10/21/2016   Procedure: POLYPECTOMY;  Surgeon: [name], MD;  [facility];  Service: Endoscopy;;  hepatic flexure x2    There were no vitals filed for this visit.  Subjective Assessment - 07/12/18 1003    Subjective  Pt states that most of his pain is at night.  The pain last a few seconds then goes away.      Pertinent History  renal cancer, Cancer of Lt mandible with LT partial mandibulectomy on 03/01/2017, DM ,HTN     Currently in Pain?  No/denies   Will continue to go to an 8/10 in his jaw at night    Pain Onset  More than a month ago         [facility]  PT Assessment - 07/12/18 0001      AROM   Cervical Extension  40    Cervical - Right Side Bend  20    Cervical - Left Side Bend  30    Cervical - Right Rotation  52    Cervical - Left Rotation  52        LYMPHEDEMA/ONCOLOGY QUESTIONNAIRE - 07/12/18 1004      Head and Neck   Right Lateral Nostril at base of nose to medial tragus   10.8 cm    Left Lateral Nostril at base of nose to medial tragus   11.5 cm    Right Corner of mouth to where ear lobe meets face  10.3 cm    Left Corner of mouth to where ear lobe meets face  11.7 cm    4 cm superior to sternal notch around neck  39.7 cm    6 cm superior to sternal notch around neck  40.6 cm    8 cm superior to sternal notch around neck  43 cm                [facility] Adult PT Treatment/Exercise - 07/12/18 0001      Exercises   Exercises  Neck      Manual Therapy   Manual Therapy   Myofascial release;Manual Lymphatic Drainage (MLD);Compression Bandaging;Muscle Energy Technique;Taping    Manual therapy comments  completed seperate from all other aspects of treatment     Myofascial Release  for scar under mandibular.     Manual Lymphatic Drainage (MLD)   cervical and facial to include internal mouth,  lip and eye area.     Muscle Energy Technique  --               PT Short Term Goals - 06/29/18 1212      PT SHORT TERM GOAL #1   Title  Pt pain to be no greater than a 5/10 to allow pt to turn his head to at least 50 degrees in both directions  to assist in safety while driving    Time  3    Period  Weeks    Status  Partially Met   s      PT SHORT TERM GOAL #2   Title  PT to be able to open his mouth 3 cm to make eating easier.     Time  3   2 weeks form now =    Period  Weeks    Status  Revised   goal 3.0cm      PT SHORT TERM GOAL #3   Title  Pt measurements to have improved by .5 cm to allow pt to have improved self image.     Time  2   from reassessment.    Period  Weeks    Status  On-going   exacerbated edema due to surgery       PT Long Term Goals - 06/29/18 1212      PT LONG TERM GOAL #1   Title  PT to be able to turn his head 60 degrees or more in both directions to be able to see blind spot while driving    Time  6   now 10 weeks    Period  Weeks    Status  On-going      PT LONG TERM GOAL #2   Title  PT measurement to be decreased 1. cm to demonstrate decreased facial and cervical  edema to improve swallowing.     Time  6   now 10 weeks    Period  Weeks    Status  Not Met   exacerbated since surgery      PT LONG TERM GOAL #3   Title  PT to be wearing his compression for 20 mintues 2x a day at least 5 days a week to keep edema down.     Time  6   now 10 weeks    Period  Weeks    Status  On-going   PT stopped using due to surgery but has started back.      PT LONG TERM GOAL #4   Title  Pt to be able to demonstrate self  massaging techniques to increase lymphatic flow to decrease swelling.     Time  6    Period  Weeks    Status  Achieved            Plan - 07/12/18 1051    Clinical Impression Statement  Pt remeasured with improvement noted in cervical rotation and volumes.  PT continues to have inner cheeck induration which is contributing to his jaw pain.  Therapist recommended pt to sleep with head as elevated as possible.     Rehab Potential  Good    PT Frequency  3x / week    PT Duration  --   additional 4 weeks.  Pt had 6 weeks of therapy, had a stent placed which has exacerbated the edema.    PT Treatment/Interventions  ADLs/Self Care Home Management;Therapeutic exercise;Patient/family education;Manual techniques;Compression bandaging    PT Next Visit Plan  continue with head decongestion with focus on Lt cheek/jaw area.     PT Home Exercise Plan  cervical ROM ; thoracic decongestion.     Consulted and Agree with Plan of Care  Patient       Patient will benefit from skilled therapeutic intervention in order to improve the following deficits and impairments:  Decreased activity tolerance, Decreased scar mobility, Decreased range of motion, Decreased mobility, Pain, Postural dysfunction, Increased edema  Visit Diagnosis: Lymphedema, not elsewhere classified     Problem List Patient Active Problem List   Diagnosis Date Noted  . Attention to G-tube ([place]) 02/08/2018  . UTI (urinary tract infection) 04/20/2017  . UTI due to Klebsiella species 04/20/2017  . Essential hypertension 04/20/2017  . Chronic pancreatitis ([place]) 04/20/2017  . 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 Malnutrition of moderate degree 04/20/2017  . Complicated UTI (urinary tract infection)   . 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 Rectal bleeding 09/28/2016  . Hyponatremia 04/28/2015  . Abdominal pain 04/27/2015  . Acute pancreatitis 04/27/2015  . Chronic alcoholic pancreatitis ([place])  04/27/2015  . ETOH abuse 04/27/2015  . DM type 2 (diabetes mellitus, type 2) ([place]) 04/27/2015  . Hypertension, uncontrolled 04/27/2015  . Hyperlipidemia 04/27/2015  . Hypertensive urgency 04/27/2015  . Alcohol abuse    Rayetta Humphrey, [place] CLT [phone] 07/12/2018, 10:53 AM  [place], [place], [address] Phone: [phone]   Fax:  [phone]  Name: Colin Wagner MRN: [id number] Date of Birth: 1950-12-19

## 2018-07-14 ENCOUNTER — Ambulatory Visit (HOSPITAL_COMMUNITY): Payer: Medicare Other | Admitting: Physical Therapy

## 2018-07-14 DIAGNOSIS — I890 Lymphedema, not elsewhere classified: Secondary | ICD-10-CM | POA: Diagnosis not present

## 2018-07-14 NOTE — Therapy (Signed)
[place], [place], [address] Phone: [phone]   Fax:  [phone]  Physical Therapy Treatment  Patient Details  Name: Colin Wagner MRN: [id number] Date of Birth: 05/17/1951 Referring Provider (PT): [name]    Encounter Date: 07/14/2018  PT End of Session - 07/14/18 1535    Visit Number  25    Number of Visits  30    Date for PT Re-Evaluation  07/20/18    Authorization - Visit Number  25    Authorization - Number of Visits  30    PT Start Time  1300    PT Stop Time  1345    PT Time Calculation (min)  45 min    Activity Tolerance  Patient tolerated treatment well    Behavior During Therapy  [facility] for tasks assessed/performed       Past Medical History:  Diagnosis Date  . Alcohol abuse   . Alcoholic pancreatitis 4854   admission  . Chronic back pain   . Chronic pancreatitis ([place])    based on ct findings 2016  . COPD (chronic obstructive pulmonary disease) ([place])   . Diabetes mellitus   . Diverticulosis   . Gastritis   . GERD (gastroesophageal reflux disease)   . Headache   . History of radiation therapy 05/12/17- 06/22/17   Left cheek and bilateral neck/ 60 Gy in 30 fractions to gross disease  . Hyperlipidemia   . Hypertension   . Peptic ulcer disease 1999   Per medical reports, no H pylori  . Renal cancer, left ([place]) 2012   he tells me that he has been released, ?and that he is free of cancer, and never had it to begin with.   . Right shoulder pain   . Testicular hypofunction     Past Surgical History:  Procedure Laterality Date  . COLONOSCOPY  2003   Dr. [name], polyps  . COLONOSCOPY  2005   Dr. [name], multiple diverticula  . COLONOSCOPY  2008   Dr. [name], diverticulosis  . COLONOSCOPY WITH PROPOFOL N/A 10/21/2016   Dr. [name]: Diverticulosis, two 5-7 mm polyps removed. path-tubular adenomas.  Next colonoscopy in 5 years.  . ESOPHAGOGASTRODUODENOSCOPY     Multiple EGDs.  1999 EGD showed gastric ulcers, no H pylori and benign biopsies performed by Dr. [name]. 2001 gastric ulcer healed. Last EGD 2005 had gastritis.  . IR REPLC GASTRO/COLONIC TUBE PERCUT W/FLUORO  05/16/2018  . Left partial mandibulectomy, Scapular free flap reconstruction, selective neck dissection, tracheotomy, and resection of intraoral palate cancer. Left 03/01/2017   [facility]  . LUNG BIOPSY    . PARTIAL NEPHRECTOMY Left 2012  . POLYPECTOMY  10/21/2016   Procedure: POLYPECTOMY;  Surgeon: [name], MD;  [facility];  Service: Endoscopy;;  hepatic flexure x2    There were no vitals filed for this visit.  Subjective Assessment - 07/14/18 1533    Subjective  PT states that he went to the MD due to having a metal taste in his mouth.  The MD stated it was due to the radiation and he was given some medication which is helping.     Pertinent History  renal cancer, Cancer of Lt mandible with LT partial mandibulectomy on 03/01/2017, DM ,HTN     Currently in Pain?  Yes    Pain Score  3     Pain Location  Jaw    Pain  Orientation  Left    Pain Descriptors / Indicators  Aching    Pain Onset  More than a month ago    Pain Frequency  Constant                       [facility] Adult PT Treatment/Exercise - 07/14/18 0001      Exercises   Exercises  Neck      Manual Therapy   Manual Therapy  Myofascial release;Manual Lymphatic Drainage (MLD);Compression Bandaging;Muscle Energy Technique;Taping    Manual therapy comments  completed seperate from all other aspects of treatment     Myofascial Release  for scar under mandibular.     Manual Lymphatic Drainage (MLD)   cervical and facial to include internal mouth,  lip and eye area.     Muscle Energy Technique  contract relax to improve rotation B                PT Short Term Goals - 06/29/18 1212      PT SHORT TERM GOAL #1   Title  Pt pain to be no greater than a 5/10 to allow pt to  turn his head to at least 50 degrees in both directions  to assist in safety while driving    Time  3    Period  Weeks    Status  Partially Met   s      PT SHORT TERM GOAL #2   Title  PT to be able to open his mouth 3 cm to make eating easier.     Time  3   2 weeks form now =    Period  Weeks    Status  Revised   goal 3.0cm      PT SHORT TERM GOAL #3   Title  Pt measurements to have improved by .5 cm to allow pt to have improved self image.     Time  2   from reassessment.    Period  Weeks    Status  On-going   exacerbated edema due to surgery       PT Long Term Goals - 06/29/18 1212      PT LONG TERM GOAL #1   Title  PT to be able to turn his head 60 degrees or more in both directions to be able to see blind spot while driving    Time  6   now 10 weeks    Period  Weeks    Status  On-going      PT LONG TERM GOAL #2   Title  PT measurement to be decreased 1. cm to demonstrate decreased facial and cervical edema to improve swallowing.     Time  6   now 10 weeks    Period  Weeks    Status  Not Met   exacerbated since surgery      PT LONG TERM GOAL #3   Title  PT to be wearing his compression for 20 mintues 2x a day at least 5 days a week to keep edema down.     Time  6   now 10 weeks    Period  Weeks    Status  On-going   PT stopped using due to surgery but has started back.      PT LONG TERM GOAL #4   Title  Pt to be able to demonstrate self massaging techniques to increase lymphatic flow to decrease swelling.  Time  6    Period  Weeks    Status  Achieved            Plan - 07/14/18 1536    Clinical Impression Statement  Decreased internal cheek induration noted in this treatment as well as decreased eyelid swelling.  Pt lips appear to be approximating.  Pt will continue to benefit from manual techniques to minimize facial edema.     Rehab Potential  Good    PT Frequency  3x / week    PT Duration  --   additional 4 weeks.  Pt had 6 weeks of therapy,  had a stent placed which has exacerbated the edema.    PT Treatment/Interventions  ADLs/Self Care Home Management;Therapeutic exercise;Patient/family education;Manual techniques;Compression bandaging    PT Next Visit Plan  continue with head decongestion with focus on Lt cheek/jaw area.     PT Home Exercise Plan  cervical ROM ; thoracic decongestion.     Consulted and Agree with Plan of Care  Patient       Patient will benefit from skilled therapeutic intervention in order to improve the following deficits and impairments:  Decreased activity tolerance, Decreased scar mobility, Decreased range of motion, Decreased mobility, Pain, Postural dysfunction, Increased edema  Visit Diagnosis: Lymphedema, not elsewhere classified     Problem List Patient Active Problem List   Diagnosis Date Noted  . Attention to G-tube ([place]) 02/08/2018  . UTI (urinary tract infection) 04/20/2017  . UTI due to Klebsiella species 04/20/2017  . Essential hypertension 04/20/2017  . Chronic pancreatitis ([place]) 04/20/2017  . 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 Malnutrition of moderate degree 04/20/2017  . Complicated UTI (urinary tract infection)   . 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 Rectal bleeding 09/28/2016  . Hyponatremia 04/28/2015  . Abdominal pain 04/27/2015  . Acute pancreatitis 04/27/2015  . Chronic alcoholic pancreatitis ([place]) 04/27/2015  . ETOH abuse 04/27/2015  . DM type 2 (diabetes mellitus, type 2) ([place]) 04/27/2015  . Hypertension, uncontrolled 04/27/2015  . Hyperlipidemia 04/27/2015  . Hypertensive urgency 04/27/2015  . Alcohol abuse   Rayetta Humphrey, [place] CLT [phone] 07/14/2018, 3:38 PM  [place] [address] [place], [place], [address] Phone: [phone]   Fax:  [phone]  Name: HELTON OLESON MRN: [id number] Date of Birth: [birth year]

## 2018-07-17 ENCOUNTER — Ambulatory Visit (HOSPITAL_COMMUNITY): Payer: Medicare Other | Admitting: Physical Therapy

## 2018-07-17 DIAGNOSIS — I890 Lymphedema, not elsewhere classified: Secondary | ICD-10-CM | POA: Diagnosis not present

## 2018-07-17 NOTE — Therapy (Signed)
[place], [place], [address] Phone: [phone]   Fax:  [phone]  Physical Therapy Treatment  Patient Details  Name: Colin Wagner MRN: [id number] Date of Birth: 01/21/1951 Referring Provider (PT): [name]    Encounter Date: 07/17/2018  PT End of Session - 07/17/18 0956    Visit Number  26    Number of Visits  30    Date for PT Re-Evaluation  07/20/18    Authorization - Visit Number  52    Authorization - Number of Visits  30    PT Start Time  0905    PT Stop Time  0945    PT Time Calculation (min)  40 min    Activity Tolerance  Patient tolerated treatment well    Behavior During Therapy  [facility] for tasks assessed/performed       Past Medical History:  Diagnosis Date  . Alcohol abuse   . Alcoholic pancreatitis 6384   admission  . Chronic back pain   . Chronic pancreatitis ([place])    based on ct findings 2016  . COPD (chronic obstructive pulmonary disease) ([place])   . Diabetes mellitus   . Diverticulosis   . Gastritis   . GERD (gastroesophageal reflux disease)   . Headache   . History of radiation therapy 05/12/17- 06/22/17   Left cheek and bilateral neck/ 60 Gy in 30 fractions to gross disease  . Hyperlipidemia   . Hypertension   . Peptic ulcer disease 1999   Per medical reports, no H pylori  . Renal cancer, left ([place]) 2012   he tells me that he has been released, ?and that he is free of cancer, and never had it to begin with.   . Right shoulder pain   . Testicular hypofunction     Past Surgical History:  Procedure Laterality Date  . COLONOSCOPY  2003   Dr. [name], polyps  . COLONOSCOPY  2005   Dr. [name], multiple diverticula  . COLONOSCOPY  2008   Dr. [name], diverticulosis  . COLONOSCOPY WITH PROPOFOL N/A 10/21/2016   Dr. [name]: Diverticulosis, two 5-7 mm polyps removed. path-tubular adenomas.  Next colonoscopy in 5 years.  . ESOPHAGOGASTRODUODENOSCOPY     Multiple EGDs.  1999 EGD showed gastric ulcers, no H pylori and benign biopsies performed by Dr. [name]. 2001 gastric ulcer healed. Last EGD 2005 had gastritis.  . IR REPLC GASTRO/COLONIC TUBE PERCUT W/FLUORO  05/16/2018  . Left partial mandibulectomy, Scapular free flap reconstruction, selective neck dissection, tracheotomy, and resection of intraoral palate cancer. Left 03/01/2017   [facility]  . LUNG BIOPSY    . PARTIAL NEPHRECTOMY Left 2012  . POLYPECTOMY  10/21/2016   Procedure: POLYPECTOMY;  Surgeon: [name], MD;  [facility];  Service: Endoscopy;;  hepatic flexure x2    There were no vitals filed for this visit.  Subjective Assessment - 07/17/18 0903    Subjective  Pt states that he is doing self massage 3 x a day and compression 3 hr a day.  Pt states that he has not had speech therapy since home health.      Pertinent History  renal cancer, Cancer of Lt mandible with LT partial mandibulectomy on 03/01/2017, DM ,HTN     Currently in Pain?  Yes    Pain Score  3     Pain Location  Jaw    Pain Orientation  Left  Pain Descriptors / Indicators  Aching    Pain Type  Chronic pain    Pain Onset  More than a month ago    Pain Frequency  Intermittent    Aggravating Factors   swelling     Pain Relieving Factors  compression                        [facility] Adult PT Treatment/Exercise - 07/17/18 0001      Exercises   Exercises  Neck      Manual Therapy   Manual Therapy  Myofascial release;Manual Lymphatic Drainage (MLD);Compression Bandaging;Muscle Energy Technique;Taping    Manual therapy comments  completed seperate from all other aspects of treatment     Myofascial Release  for scar under mandibular.     Manual Lymphatic Drainage (MLD)   cervical and facial to include internal mouth,  lip and eye area.     Muscle Energy Technique  --               PT Short Term Goals - 06/29/18 1212      PT SHORT TERM GOAL #1   Title  Pt  pain to be no greater than a 5/10 to allow pt to turn his head to at least 50 degrees in both directions  to assist in safety while driving    Time  3    Period  Weeks    Status  Partially Met   s      PT SHORT TERM GOAL #2   Title  PT to be able to open his mouth 3 cm to make eating easier.     Time  3   2 weeks form now =    Period  Weeks    Status  Revised   goal 3.0cm      PT SHORT TERM GOAL #3   Title  Pt measurements to have improved by .5 cm to allow pt to have improved self image.     Time  2   from reassessment.    Period  Weeks    Status  On-going   exacerbated edema due to surgery       PT Long Term Goals - 06/29/18 1212      PT LONG TERM GOAL #1   Title  PT to be able to turn his head 60 degrees or more in both directions to be able to see blind spot while driving    Time  6   now 10 weeks    Period  Weeks    Status  On-going      PT LONG TERM GOAL #2   Title  PT measurement to be decreased 1. cm to demonstrate decreased facial and cervical edema to improve swallowing.     Time  6   now 10 weeks    Period  Weeks    Status  Not Met   exacerbated since surgery      PT LONG TERM GOAL #3   Title  PT to be wearing his compression for 20 mintues 2x a day at least 5 days a week to keep edema down.     Time  6   now 10 weeks    Period  Weeks    Status  On-going   PT stopped using due to surgery but has started back.      PT LONG TERM GOAL #4   Title  Pt to be able to  demonstrate self massaging techniques to increase lymphatic flow to decrease swelling.     Time  6    Period  Weeks    Status  Achieved            Plan - 07/17/18 0956    Clinical Impression Statement  PT encouraged pt to keep compression on for 5-6 hours.  Therapist sent referral for speech/swallow evaluation to see if there are any other exercises pt can be doing to strengthen swallowing mm.     Rehab Potential  Good    PT Frequency  3x / week    PT Duration  --   additional 4  weeks.  Pt had 6 weeks of therapy, had a stent placed which has exacerbated the edema.    PT Treatment/Interventions  ADLs/Self Care Home Management;Therapeutic exercise;Patient/family education;Manual techniques;Compression bandaging    PT Next Visit Plan  continue with head decongestion with focus on Lt cheek/jaw area.  Measure next treatment.     PT Home Exercise Plan  cervical ROM ; thoracic decongestion.     Consulted and Agree with Plan of Care  Patient       Patient will benefit from skilled therapeutic intervention in order to improve the following deficits and impairments:  Decreased activity tolerance, Decreased scar mobility, Decreased range of motion, Decreased mobility, Pain, Postural dysfunction, Increased edema  Visit Diagnosis: Lymphedema, not elsewhere classified     Problem List Patient Active Problem List   Diagnosis Date Noted  . Attention to G-tube ([place]) 02/08/2018  . UTI (urinary tract infection) 04/20/2017  . UTI due to Klebsiella species 04/20/2017  . Essential hypertension 04/20/2017  . Chronic pancreatitis ([place]) 04/20/2017  . 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 Malnutrition of moderate degree 04/20/2017  . Complicated UTI (urinary tract infection)   . 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 Rectal bleeding 09/28/2016  . Hyponatremia 04/28/2015  . Abdominal pain 04/27/2015  . Acute pancreatitis 04/27/2015  . Chronic alcoholic pancreatitis ([place]) 04/27/2015  . ETOH abuse 04/27/2015  . DM type 2 (diabetes mellitus, type 2) ([place]) 04/27/2015  . Hypertension, uncontrolled 04/27/2015  . Hyperlipidemia 04/27/2015  . Hypertensive urgency 04/27/2015  . Alcohol abuse     Rayetta Humphrey, [place] CLT [phone] 07/17/2018, 9:58 AM  [place], [place], [address] Phone: [phone]   Fax:  [phone]  Name: Colin Wagner MRN: [id number] Date of Birth:  11-Mar-1951

## 2018-07-18 ENCOUNTER — Ambulatory Visit (HOSPITAL_COMMUNITY): Payer: Medicare Other | Admitting: Physical Therapy

## 2018-07-18 DIAGNOSIS — I890 Lymphedema, not elsewhere classified: Secondary | ICD-10-CM | POA: Diagnosis not present

## 2018-07-18 NOTE — Therapy (Signed)
[place], [place], [address] Phone: [phone]   Fax:  [phone]  Physical Therapy Treatment  Patient Details  Name: Colin Wagner MRN: [id number] Date of Birth: 11-Oct-1950 Referring Provider (PT): [name]    Encounter Date: 07/18/2018  PT End of Session - 07/18/18 1206    Visit Number  27    Number of Visits  30    Date for PT Re-Evaluation  07/20/18    Authorization - Visit Number  2    Authorization - Number of Visits  30    PT Start Time  0945    PT Stop Time  1030    PT Time Calculation (min)  45 min    Activity Tolerance  Patient tolerated treatment well    Behavior During Therapy  [facility] for tasks assessed/performed       Past Medical History:  Diagnosis Date  . Alcohol abuse   . Alcoholic pancreatitis 9169   admission  . Chronic back pain   . Chronic pancreatitis ([place])    based on ct findings 2016  . COPD (chronic obstructive pulmonary disease) ([place])   . Diabetes mellitus   . Diverticulosis   . Gastritis   . GERD (gastroesophageal reflux disease)   . Headache   . History of radiation therapy 05/12/17- 06/22/17   Left cheek and bilateral neck/ 60 Gy in 30 fractions to gross disease  . Hyperlipidemia   . Hypertension   . Peptic ulcer disease 1999   Per medical reports, no H pylori  . Renal cancer, left ([place]) 2012   he tells me that he has been released, ?and that he is free of cancer, and never had it to begin with.   . Right shoulder pain   . Testicular hypofunction     Past Surgical History:  Procedure Laterality Date  . COLONOSCOPY  2003   Dr. [name], polyps  . COLONOSCOPY  2005   Dr. [name], multiple diverticula  . COLONOSCOPY  2008   Dr. [name], diverticulosis  . COLONOSCOPY WITH PROPOFOL N/A 10/21/2016   Dr. [name]: Diverticulosis, two 5-7 mm polyps removed. path-tubular adenomas.  Next colonoscopy in 5 years.  . ESOPHAGOGASTRODUODENOSCOPY     Multiple EGDs.  1999 EGD showed gastric ulcers, no H pylori and benign biopsies performed by Dr. [name]. 2001 gastric ulcer healed. Last EGD 2005 had gastritis.  . IR REPLC GASTRO/COLONIC TUBE PERCUT W/FLUORO  05/16/2018  . Left partial mandibulectomy, Scapular free flap reconstruction, selective neck dissection, tracheotomy, and resection of intraoral palate cancer. Left 03/01/2017   [facility]  . LUNG BIOPSY    . PARTIAL NEPHRECTOMY Left 2012  . POLYPECTOMY  10/21/2016   Procedure: POLYPECTOMY;  Surgeon: [name], MD;  Location[facility];  Service: Endoscopy;;  hepatic flexure x2    There were no vitals filed for this visit.  Subjective Assessment - 07/18/18 0947    Subjective  PT has no concerns.  Pain is minimal today    Pertinent History  renal cancer, Cancer of Lt mandible with LT partial mandibulectomy on 03/01/2017, DM ,HTN     Currently in Pain?  Yes    Pain Score  1     Pain Location  Jaw    Pain Orientation  Left    Pain Descriptors / Indicators  Aching    Pain Onset  More than a month ago  LYMPHEDEMA/ONCOLOGY QUESTIONNAIRE - 07/18/18 0948      Head and Neck   Right Lateral Nostril at base of nose to medial tragus   10.8 cm    Left Lateral Nostril at base of nose to medial tragus   11.3 cm    Right Corner of mouth to where ear lobe meets face  10.5 cm    Left Corner of mouth to where ear lobe meets face  11.7 cm                [facility] Adult PT Treatment/Exercise - 07/18/18 0001      Exercises   Exercises  Neck      Manual Therapy   Manual Therapy  Myofascial release;Manual Lymphatic Drainage (MLD);Compression Bandaging;Muscle Energy Technique;Taping    Manual therapy comments  completed seperate from all other aspects of treatment     Myofascial Release  for scar under mandibular.     Manual Lymphatic Drainage (MLD)   cervical and facial to include internal mouth,  lip and eye area.                PT Short  Term Goals - 06/29/18 1212      PT SHORT TERM GOAL #1   Title  Pt pain to be no greater than a 5/10 to allow pt to turn his head to at least 50 degrees in both directions  to assist in safety while driving    Time  3    Period  Weeks    Status  Partially Met   s      PT SHORT TERM GOAL #2   Title  PT to be able to open his mouth 3 cm to make eating easier.     Time  3   2 weeks form now =    Period  Weeks    Status  Revised   goal 3.0cm      PT SHORT TERM GOAL #3   Title  Pt measurements to have improved by .5 cm to allow pt to have improved self image.     Time  2   from reassessment.    Period  Weeks    Status  On-going   exacerbated edema due to surgery       PT Long Term Goals - 06/29/18 1212      PT LONG TERM GOAL #1   Title  PT to be able to turn his head 60 degrees or more in both directions to be able to see blind spot while driving    Time  6   now 10 weeks    Period  Weeks    Status  On-going      PT LONG TERM GOAL #2   Title  PT measurement to be decreased 1. cm to demonstrate decreased facial and cervical edema to improve swallowing.     Time  6   now 10 weeks    Period  Weeks    Status  Not Met   exacerbated since surgery      PT LONG TERM GOAL #3   Title  PT to be wearing his compression for 20 mintues 2x a day at least 5 days a week to keep edema down.     Time  6   now 10 weeks    Period  Weeks    Status  On-going   PT stopped using due to surgery but has started back.      PT  LONG TERM GOAL #4   Title  Pt to be able to demonstrate self massaging techniques to increase lymphatic flow to decrease swelling.     Time  6    Period  Weeks    Status  Achieved            Plan - 07/18/18 1207    Clinical Impression Statement  Pt measured today with minimal change.  Will continue one more week to see if volume can change if no results will discharge to self care.     Rehab Potential  Good    PT Frequency  3x / week    PT Duration  --    additional 4 weeks.  Pt had 6 weeks of therapy, had a stent placed which has exacerbated the edema.    PT Treatment/Interventions  ADLs/Self Care Home Management;Therapeutic exercise;Patient/family education;Manual techniques;Compression bandaging    PT Next Visit Plan  continue with head decongestion with focus on Lt cheek/jaw area as well as myofascial release for scar areas.     PT Home Exercise Plan  cervical ROM ; thoracic decongestion.     Consulted and Agree with Plan of Care  Patient       Patient will benefit from skilled therapeutic intervention in order to improve the following deficits and impairments:  Decreased activity tolerance, Decreased scar mobility, Decreased range of motion, Decreased mobility, Pain, Postural dysfunction, Increased edema  Visit Diagnosis: Lymphedema, not elsewhere classified     Problem List Patient Active Problem List   Diagnosis Date Noted  . Attention to G-tube ([place]) 02/08/2018  . UTI (urinary tract infection) 04/20/2017  . UTI due to Klebsiella species 04/20/2017  . Essential hypertension 04/20/2017  . Chronic pancreatitis ([place]) 04/20/2017  . 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 Malnutrition of moderate degree 04/20/2017  . Complicated UTI (urinary tract infection)   . 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 Rectal bleeding 09/28/2016  . Hyponatremia 04/28/2015  . Abdominal pain 04/27/2015  . Acute pancreatitis 04/27/2015  . Chronic alcoholic pancreatitis ([place]) 04/27/2015  . ETOH abuse 04/27/2015  . DM type 2 (diabetes mellitus, type 2) ([place]) 04/27/2015  . Hypertension, uncontrolled 04/27/2015  . Hyperlipidemia 04/27/2015  . Hypertensive urgency 04/27/2015  . Alcohol abuse   Rayetta Humphrey, [place] CLT [phone] 07/18/2018, 12:08 PM  [place] [address] [place], [place], [address] Phone: [phone]   Fax:  [phone]  Name: Colin Wagner MRN:  [id number] Date of Birth: 1951/02/22

## 2018-07-19 ENCOUNTER — Encounter (HOSPITAL_COMMUNITY): Payer: Self-pay | Admitting: Physical Therapy

## 2018-07-27 ENCOUNTER — Ambulatory Visit (HOSPITAL_COMMUNITY): Payer: Medicare Other | Attending: Radiation Oncology | Admitting: Physical Therapy

## 2018-07-27 DIAGNOSIS — I890 Lymphedema, not elsewhere classified: Secondary | ICD-10-CM | POA: Diagnosis present

## 2018-07-27 NOTE — Therapy (Signed)
[place], [place], [address] Phone: [phone]   Fax:  [phone]  Physical Therapy Treatment  Patient Details  Name: Colin Wagner MRN: [id number] Date of Birth: 1950/09/10 Referring Provider (PT): [name]    Encounter Date: 07/27/2018  PT End of Session - 07/27/18 1433    Visit Number  28    Number of Visits  30    Date for PT Re-Evaluation  07/20/18    Authorization - Visit Number  28    Authorization - Number of Visits  30    PT Start Time  1350    PT Stop Time  1430    PT Time Calculation (min)  40 min    Activity Tolerance  Patient tolerated treatment well    Behavior During Therapy  [facility] for tasks assessed/performed       Past Medical History:  Diagnosis Date  . Alcohol abuse   . Alcoholic pancreatitis 8329   admission  . Chronic back pain   . Chronic pancreatitis ([place])    based on ct findings 2016  . COPD (chronic obstructive pulmonary disease) ([place])   . Diabetes mellitus   . Diverticulosis   . Gastritis   . GERD (gastroesophageal reflux disease)   . Headache   . History of radiation therapy 05/12/17- 06/22/17   Left cheek and bilateral neck/ 60 Gy in 30 fractions to gross disease  . Hyperlipidemia   . Hypertension   . Peptic ulcer disease 1999   Per medical reports, no H pylori  . Renal cancer, left ([place]) 2012   he tells me that he has been released, ?and that he is free of cancer, and never had it to begin with.   . Right shoulder pain   . Testicular hypofunction     Past Surgical History:  Procedure Laterality Date  . COLONOSCOPY  2003   Dr. [name], polyps  . COLONOSCOPY  2005   Dr. [name], multiple diverticula  . COLONOSCOPY  2008   Dr. [name], diverticulosis  . COLONOSCOPY WITH PROPOFOL N/A 10/21/2016   Dr. [name]: Diverticulosis, two 5-7 mm polyps removed. path-tubular adenomas.  Next colonoscopy in 5 years.  . ESOPHAGOGASTRODUODENOSCOPY     Multiple EGDs.  1999 EGD showed gastric ulcers, no H pylori and benign biopsies performed by Dr. [name]. 2001 gastric ulcer healed. Last EGD 2005 had gastritis.  . IR REPLC GASTRO/COLONIC TUBE PERCUT W/FLUORO  05/16/2018  . Left partial mandibulectomy, Scapular free flap reconstruction, selective neck dissection, tracheotomy, and resection of intraoral palate cancer. Left 03/01/2017   [facility]  . LUNG BIOPSY    . PARTIAL NEPHRECTOMY Left 2012  . POLYPECTOMY  10/21/2016   Procedure: POLYPECTOMY;  Surgeon: [name], MD;  [facility];  Service: Endoscopy;;  hepatic flexure x2    There were no vitals filed for this visit.  Subjective Assessment - 07/27/18 1432    Subjective  Pt states that he is using his compression and he is having no pain today    Pertinent History  renal cancer, Cancer of Lt mandible with LT partial mandibulectomy on 03/01/2017, DM ,HTN     Currently in Pain?  No/denies    Pain Onset  More than a month ago                       [facility] Adult PT Treatment/Exercise - 07/27/18 0001  Exercises   Exercises  Neck      Manual Therapy   Manual Therapy  Myofascial release;Manual Lymphatic Drainage (MLD);Compression Bandaging;Muscle Energy Technique;Taping    Manual therapy comments  completed seperate from all other aspects of treatment     Myofascial Release  for scar under mandibular.     Manual Lymphatic Drainage (MLD)   cervical and facial to include internal mouth,  lip and eye area.                PT Short Term Goals - 06/29/18 1212      PT SHORT TERM GOAL #1   Title  Pt pain to be no greater than a 5/10 to allow pt to turn his head to at least 50 degrees in both directions  to assist in safety while driving    Time  3    Period  Weeks    Status  Partially Met   s      PT SHORT TERM GOAL #2   Title  PT to be able to open his mouth 3 cm to make eating easier.     Time  3   2 weeks form now =    Period   Weeks    Status  Revised   goal 3.0cm      PT SHORT TERM GOAL #3   Title  Pt measurements to have improved by .5 cm to allow pt to have improved self image.     Time  2   from reassessment.    Period  Weeks    Status  On-going   exacerbated edema due to surgery       PT Long Term Goals - 06/29/18 1212      PT LONG TERM GOAL #1   Title  PT to be able to turn his head 60 degrees or more in both directions to be able to see blind spot while driving    Time  6   now 10 weeks    Period  Weeks    Status  On-going      PT LONG TERM GOAL #2   Title  PT measurement to be decreased 1. cm to demonstrate decreased facial and cervical edema to improve swallowing.     Time  6   now 10 weeks    Period  Weeks    Status  Not Met   exacerbated since surgery      PT LONG TERM GOAL #3   Title  PT to be wearing his compression for 20 mintues 2x a day at least 5 days a week to keep edema down.     Time  6   now 10 weeks    Period  Weeks    Status  On-going   PT stopped using due to surgery but has started back.      PT LONG TERM GOAL #4   Title  Pt to be able to demonstrate self massaging techniques to increase lymphatic flow to decrease swelling.     Time  6    Period  Weeks    Status  Achieved            Plan - 07/27/18 1433    Clinical Impression Statement  Pt has not been seen since 11/26 due to Thanksgiving Holiday therefore we will measure tomorrow.  Noted improvement in cervical ROM but pt may have maximized his improvement with skilled care.    Rehab Potential  Good  PT Frequency  3x / week    PT Duration  --   additional 4 weeks.  Pt had 6 weeks of therapy, had a stent placed which has exacerbated the edema.    PT Treatment/Interventions  ADLs/Self Care Home Management;Therapeutic exercise;Patient/family education;Manual techniques;Compression bandaging    PT Next Visit Plan  reassess next visit     PT Home Exercise Plan  cervical ROM ; thoracic decongestion.      Consulted and Agree with Plan of Care  Patient       Patient will benefit from skilled therapeutic intervention in order to improve the following deficits and impairments:  Decreased activity tolerance, Decreased scar mobility, Decreased range of motion, Decreased mobility, Pain, Postural dysfunction, Increased edema  Visit Diagnosis: Lymphedema, not elsewhere classified     Problem List Patient Active Problem List   Diagnosis Date Noted  . Attention to G-tube ([place]) 02/08/2018  . UTI (urinary tract infection) 04/20/2017  . UTI due to Klebsiella species 04/20/2017  . Essential hypertension 04/20/2017  . Chronic pancreatitis ([place]) 04/20/2017  . 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 Malnutrition of moderate degree 04/20/2017  . Complicated UTI (urinary tract infection)   . 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 Rectal bleeding 09/28/2016  . Hyponatremia 04/28/2015  . Abdominal pain 04/27/2015  . Acute pancreatitis 04/27/2015  . Chronic alcoholic pancreatitis ([place]) 04/27/2015  . ETOH abuse 04/27/2015  . DM type 2 (diabetes mellitus, type 2) ([place]) 04/27/2015  . Hypertension, uncontrolled 04/27/2015  . Hyperlipidemia 04/27/2015  . Hypertensive urgency 04/27/2015  . Alcohol abuse     Rayetta Humphrey, [place] CLT [phone] 07/27/2018, 2:35 PM  [place] [address] [place], [place], [address] Phone: [phone]   Fax:  [phone]  Name: Colin Wagner MRN: [id number] Date of Birth: [birth year]

## 2018-07-28 ENCOUNTER — Ambulatory Visit (HOSPITAL_COMMUNITY): Payer: Medicare Other | Admitting: Physical Therapy

## 2018-07-28 DIAGNOSIS — I890 Lymphedema, not elsewhere classified: Secondary | ICD-10-CM

## 2018-07-28 NOTE — Addendum Note (Signed)
Addended by: Leeroy Cha on: 07/28/2018 03:59 PM   Modules accepted: Orders

## 2018-07-28 NOTE — Therapy (Signed)
[facility] [place], [place], [address] Phone: [phone]   Fax:  [phone]  Physical Therapy Treatment  Patient Details  Name: Colin Wagner MRN: [id number] Date of Birth: [birth year] Referring Provider (PT): [name]    Encounter Date: 07/28/2018  PT End of Session - 07/28/18 1346    Visit Number  29    Number of Visits  30    Date for PT Re-Evaluation  07/20/18    Authorization - Visit Number  45    Authorization - Number of Visits  30    PT Start Time  1300    PT Stop Time  1345    PT Time Calculation (min)  45 min    Activity Tolerance  Patient tolerated treatment well    Behavior During Therapy  [facility] for tasks assessed/performed       Past Medical History:  Diagnosis Date  . Alcohol abuse   . Alcoholic pancreatitis 6314   admission  . Chronic back pain   . Chronic pancreatitis ([place])    based on ct findings 2016  . COPD (chronic obstructive pulmonary disease) ([place])   . Diabetes mellitus   . Diverticulosis   . Gastritis   . GERD (gastroesophageal reflux disease)   . Headache   . History of radiation therapy 05/12/17- 06/22/17   Left cheek and bilateral neck/ 60 Gy in 30 fractions to gross disease  . Hyperlipidemia   . Hypertension   . Peptic ulcer disease 1999   Per medical reports, no H pylori  . Renal cancer, left ([place]) 2012   he tells me that he has been released, ?and that he is free of cancer, and never had it to begin with.   . Right shoulder pain   . Testicular hypofunction     Past Surgical History:  Procedure Laterality Date  . COLONOSCOPY  2003   Dr. [name], polyps  . COLONOSCOPY  2005   Dr. [name], multiple diverticula  . COLONOSCOPY  2008   Dr. [name], diverticulosis  . COLONOSCOPY WITH PROPOFOL N/A 10/21/2016   Dr. [name]: Diverticulosis, two 5-7 mm polyps removed. path-tubular adenomas.  Next colonoscopy in 5 years.  . ESOPHAGOGASTRODUODENOSCOPY     Multiple EGDs.  1999 EGD showed gastric ulcers, no H pylori and benign biopsies performed by Dr. [name]. 2001 gastric ulcer healed. Last EGD 2005 had gastritis.  . IR REPLC GASTRO/COLONIC TUBE PERCUT W/FLUORO  05/16/2018  . Left partial mandibulectomy, Scapular free flap reconstruction, selective neck dissection, tracheotomy, and resection of intraoral palate cancer. Left 03/01/2017   [facility]  . LUNG BIOPSY    . PARTIAL NEPHRECTOMY Left 2012  . POLYPECTOMY  10/21/2016   Procedure: POLYPECTOMY;  Surgeon: [name], MD;  [facility];  Service: Endoscopy;;  hepatic flexure x2    There were no vitals filed for this visit.  Subjective Assessment - 07/28/18 1301    Subjective  No pain; pt would like to continue therapy to see if we can reduce edema any further.     Pertinent History  renal cancer, Cancer of Lt mandible with LT partial mandibulectomy on 03/01/2017, DM ,HTN     Currently in Pain?  No/denies    Pain Onset  More than a month ago         [facility] PT Assessment - 07/28/18 0001      Assessment   Medical Diagnosis  cervical lymphedema  Referring Provider (PT)  [name]     Onset Date/Surgical Date  01/21/18    Prior Therapy  none for this issue       Precautions   Precautions  None      Restrictions   Weight Bearing Restrictions  No      [place] residence      Prior Function   Level of Independence  Independent      Cognition   Overall Cognitive Status  Within Functional Limits for tasks assessed      AROM   Cervical Flexion  45   inital 40   Cervical Extension  50   inital 30   Cervical - Right Side Bend  20   initial 20   Cervical - Left Side Bend  30   inital 16   Cervical - Right Rotation  53   inital 26   Cervical - Left Rotation  55   inital38       LYMPHEDEMA/ONCOLOGY QUESTIONNAIRE - 07/28/18 1306      Head and Neck   Right Lateral Nostril at base of nose to medial tragus    10.4 cm    Left Lateral Nostril at base of nose to medial tragus   10.9 cm    Right Corner of mouth to where ear lobe meets face  9 cm    Left Corner of mouth to where ear lobe meets face  11.1 cm    4 cm superior to sternal notch around neck  39.8 cm    6 cm superior to sternal notch around neck  40 cm                [facility] Adult PT Treatment/Exercise - 07/28/18 0001      Exercises   Exercises  Neck      Manual Therapy   Manual Therapy  Myofascial release;Manual Lymphatic Drainage (MLD);Compression Bandaging;Muscle Energy Technique;Taping    Manual therapy comments  completed seperate from all other aspects of treatment     Myofascial Release  for scar under mandibular.     Manual Lymphatic Drainage (MLD)   cervical and facial to include internal mouth,  lip and eye area.              PT Education - 07/28/18 1345    Education Details  self internal massage     Person(s) Educated  Patient    Methods  Explanation       PT Short Term Goals - 06/29/18 1212      PT SHORT TERM GOAL #1   Title  Pt pain to be no greater than a 5/10 to allow pt to turn his head to at least 50 degrees in both directions  to assist in safety while driving    Time  3    Period  Weeks    Status  Partially Met   s      PT SHORT TERM GOAL #2   Title  PT to be able to open his mouth 3 cm to make eating easier.     Time  3   2 weeks form now =    Period  Weeks    Status  Revised   goal 3.0cm      PT SHORT TERM GOAL #3   Title  Pt measurements to have improved by .5 cm to allow pt to have improved self image.     Time  2   from reassessment.    Period  Weeks    Status  On-going   exacerbated edema due to surgery       PT Long Term Goals - 06/29/18 1212      PT LONG TERM GOAL #1   Title  PT to be able to turn his head 60 degrees or more in both directions to be able to see blind spot while driving    Time  6   now 10 weeks    Period  Weeks    Status  On-going      PT LONG  TERM GOAL #2   Title  PT measurement to be decreased 1. cm to demonstrate decreased facial and cervical edema to improve swallowing.     Time  6   now 10 weeks    Period  Weeks    Status  Not Met   exacerbated since surgery      PT LONG TERM GOAL #3   Title  PT to be wearing his compression for 20 mintues 2x a day at least 5 days a week to keep edema down.     Time  6   now 10 weeks    Period  Weeks    Status  On-going   PT stopped using due to surgery but has started back.      PT LONG TERM GOAL #4   Title  Pt to be able to demonstrate self massaging techniques to increase lymphatic flow to decrease swelling.     Time  6    Period  Weeks    Status  Achieved            Plan - 07/28/18 1346    Clinical Impression Statement  Measured pt who is now reduced to level prior to his endarectomy.  Unknown if pt will reduce further we will continue treatment and measure weekly, once volumes are not longer moving will discharge to self program.      Rehab Potential  Good    PT Frequency  3x / week    PT Duration  --   additional 4 weeks.  Pt had 6 weeks of therapy, had a stent placed which has exacerbated the edema.    PT Treatment/Interventions  ADLs/Self Care Home Management;Therapeutic exercise;Patient/family education;Manual techniques;Compression bandaging    PT Next Visit Plan  reassess next visit     PT Home Exercise Plan  cervical ROM ; thoracic decongestion.     Consulted and Agree with Plan of Care  Patient       Patient will benefit from skilled therapeutic intervention in order to improve the following deficits and impairments:  Decreased activity tolerance, Decreased scar mobility, Decreased range of motion, Decreased mobility, Pain, Postural dysfunction, Increased edema  Visit Diagnosis: Lymphedema, not elsewhere classified     Problem List Patient Active Problem List   Diagnosis Date Noted  . Attention to G-tube ([place]) 02/08/2018  . UTI (urinary tract  infection) 04/20/2017  . UTI due to Klebsiella species 04/20/2017  . Essential hypertension 04/20/2017  . Chronic pancreatitis ([place]) 04/20/2017  . 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 Malnutrition of moderate degree 04/20/2017  . Complicated UTI (urinary tract infection)   . 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 Rectal bleeding 09/28/2016  . Hyponatremia 04/28/2015  . Abdominal pain 04/27/2015  . Acute pancreatitis 04/27/2015  . Chronic alcoholic pancreatitis ([place]) 04/27/2015  . ETOH abuse 04/27/2015  . DM  type 2 (diabetes mellitus, type 2) ([place]) 04/27/2015  . Hypertension, uncontrolled 04/27/2015  . Hyperlipidemia 04/27/2015  . Hypertensive urgency 04/27/2015  . Alcohol abuse     Rayetta Humphrey, [place] CLT [phone] 07/28/2018, 1:48 PM  [facility] [address] [place], [place], [address] Phone: [phone]   Fax:  [phone]  Name: Colin Wagner MRN: [id number] Date of Birth: 09-Jan-1951

## 2018-08-01 ENCOUNTER — Ambulatory Visit (HOSPITAL_COMMUNITY): Payer: Medicare Other | Admitting: Physical Therapy

## 2018-08-01 DIAGNOSIS — I890 Lymphedema, not elsewhere classified: Secondary | ICD-10-CM

## 2018-08-01 NOTE — Therapy (Addendum)
[facility] [address] [place], [place], [address] Phone: [phone]   Fax:  [phone]  Physical Therapy Treatment  Patient Details  Name: Colin Wagner MRN: [id number] Date of Birth: 07-25-1951 Referring Provider (PT): [name]    Encounter Date: 08/01/2018 Progress Note Reporting Period 06/29/2018 to 08/01/2018  See note below for Objective Data and Assessment of Progress/Goals.      PT End of Session - 08/01/18 1157    Visit Number  30    Number of Visits  32    Date for PT Re-Evaluation  07/20/18    Authorization - Visit Number  29    Authorization - Number of Visits  32    PT Start Time  1100    PT Stop Time  1148    PT Time Calculation (min)  48 min    Activity Tolerance  Patient tolerated treatment well    Behavior During Therapy  WFL for tasks assessed/performed       Past Medical History:  Diagnosis Date  . Alcohol abuse   . Alcoholic pancreatitis 9024   admission  . Chronic back pain   . Chronic pancreatitis ([place])    based on ct findings 2016  . COPD (chronic obstructive pulmonary disease) ([place])   . Diabetes mellitus   . Diverticulosis   . Gastritis   . GERD (gastroesophageal reflux disease)   . Headache   . History of radiation therapy 05/12/17- 06/22/17   Left cheek and bilateral neck/ 60 Gy in 30 fractions to gross disease  . Hyperlipidemia   . Hypertension   . Peptic ulcer disease 1999   Per medical reports, no H pylori  . Renal cancer, left ([place]) 2012   he tells me that he has been released, ?and that he is free of cancer, and never had it to begin with.   . Right shoulder pain   . Testicular hypofunction     Past Surgical History:  Procedure Laterality Date  . COLONOSCOPY  2003   Dr. [name], polyps  . COLONOSCOPY  2005   Dr. [name], multiple diverticula  . COLONOSCOPY  2008   Dr. [name], diverticulosis  . COLONOSCOPY WITH PROPOFOL N/A 10/21/2016   Dr. [name]:  Diverticulosis, two 5-7 mm polyps removed. path-tubular adenomas.  Next colonoscopy in 5 years.  . ESOPHAGOGASTRODUODENOSCOPY     Multiple EGDs. 1999 EGD showed gastric ulcers, no H pylori and benign biopsies performed by Dr. [name]. 2001 gastric ulcer healed. Last EGD 2005 had gastritis.  . IR REPLC GASTRO/COLONIC TUBE PERCUT W/FLUORO  05/16/2018  . Left partial mandibulectomy, Scapular free flap reconstruction, selective neck dissection, tracheotomy, and resection of intraoral palate cancer. Left 03/01/2017   [facility]  . LUNG BIOPSY    . PARTIAL NEPHRECTOMY Left 2012  . POLYPECTOMY  10/21/2016   Procedure: POLYPECTOMY;  Surgeon: [name], MD;  [facility];  Service: Endoscopy;;  hepatic flexure x2    There were no vitals filed for this visit.  Subjective Assessment - 08/01/18 1154    Subjective  PT continues to complete self massage and use compression therapy once a day     Pertinent History  renal cancer, Cancer of Lt mandible with LT partial mandibulectomy on 03/01/2017, DM ,HTN     Currently in Pain?  No/denies    Pain Onset  More than a month ago         [facility]  PT Assessment - 08/01/18 0001      Assessment   Medical Diagnosis  cervical lymphedema    Referring Provider (PT)  [name]     Onset Date/Surgical Date  01/21/18    Prior Therapy  none for this issue       Precautions   Precautions  None      Restrictions   Weight Bearing Restrictions  No      [place] residence      Prior Function   Level of Independence  Independent      Cognition   Overall Cognitive Status  Within Functional Limits for tasks assessed      AROM   Cervical Flexion  50   inital 40 visit 20 55   Cervical Extension  30   inital 30 visit 20 30   Cervical - Right Side Bend  20   initial 20 visit 10 20   Cervical - Left Side Bend  30   inital 16; visit 20 30   Cervical - Right Rotation  53   inital  26 visit 20 45   Cervical - Left Rotation  55   inital38; visit 20 50        LYMPHEDEMA/ONCOLOGY QUESTIONNAIRE - 08/01/18 1421      Head and Neck   Right Lateral Nostril at base of nose to medial tragus   10.4 cm   visit 20 11   Left Lateral Nostril at base of nose to medial tragus   10.9 cm   visit 20 11.5 cm    Right Corner of mouth to where ear lobe meets face  9 cm   visit 20 10.5 cm    Left Corner of mouth to where ear lobe meets face  11.1 cm   visit 20 12 cm    4 cm superior to sternal notch around neck  39.8 cm    6 cm superior to sternal notch around neck  40 cm                [facility] Adult PT Treatment/Exercise - 08/01/18 0001      Exercises   Exercises  Neck      Manual Therapy   Manual Therapy  Myofascial release;Manual Lymphatic Drainage (MLD);Compression Bandaging;Muscle Energy Technique;Taping    Manual therapy comments  completed seperate from all other aspects of treatment     Myofascial Release  for scar under mandibular.     Manual Lymphatic Drainage (MLD)   cervical and facial to include internal mouth,  lip and eye area.                PT Short Term Goals - 06/29/18 1212      PT SHORT TERM GOAL #1   Title  Pt pain to be no greater than a 5/10 to allow pt to turn his head to at least 50 degrees in both directions  to assist in safety while driving    Time  3    Period  Weeks    Status  Partially Met   s      PT SHORT TERM GOAL #2   Title  PT to be able to open his mouth 3 cm to make eating easier.     Time  3   2 weeks form now =    Period  Weeks    Status  Revised   goal 3.0cm      PT  SHORT TERM GOAL #3   Title  Pt measurements to have improved by .5 cm to allow pt to have improved self image.     Time  2   from reassessment.    Period  Weeks    Status  On-going   exacerbated edema due to surgery       PT Long Term Goals - 06/29/18 1212      PT LONG TERM GOAL #1   Title  PT to be able to turn his head 60 degrees or  more in both directions to be able to see blind spot while driving    Time  6   now 10 weeks    Period  Weeks    Status  On-going      PT LONG TERM GOAL #2   Title  PT measurement to be decreased 1. cm to demonstrate decreased facial and cervical edema to improve swallowing.     Time  6   now 10 weeks    Period  Weeks    Status  Not Met   exacerbated since surgery      PT LONG TERM GOAL #3   Title  PT to be wearing his compression for 20 mintues 2x a day at least 5 days a week to keep edema down.     Time  6   now 10 weeks    Period  Weeks    Status  On-going   PT stopped using due to surgery but has started back.      PT LONG TERM GOAL #4   Title  Pt to be able to demonstrate self massaging techniques to increase lymphatic flow to decrease swelling.     Time  6    Period  Weeks    Status  Achieved            Plan - 08/01/18 1158    Clinical Impression Statement  Pt self massaging internally as well as externally now.  Pain has reduced significantly.  Will remeasure next visit anticipate discharge this week     Rehab Potential  Good    PT Frequency  3x / week    PT Duration  --   additional 4 weeks.  Pt had 6 weeks of therapy, had a stent placed which has exacerbated the edema.    PT Treatment/Interventions  ADLs/Self Care Home Management;Therapeutic exercise;Patient/family education;Manual techniques;Compression bandaging    PT Next Visit Plan  measure next visit     PT Home Exercise Plan  cervical ROM ; thoracic decongestion.     Consulted and Agree with Plan of Care  Patient       Patient will benefit from skilled therapeutic intervention in order to improve the following deficits and impairments:  Decreased activity tolerance, Decreased scar mobility, Decreased range of motion, Decreased mobility, Pain, Postural dysfunction, Increased edema  Visit Diagnosis: Lymphedema, not elsewhere classified     Problem List Patient Active Problem List   Diagnosis Date  Noted  . Attention to G-tube ([place]) 02/08/2018  . UTI (urinary tract infection) 04/20/2017  . UTI due to Klebsiella species 04/20/2017  . Essential hypertension 04/20/2017  . Chronic pancreatitis ([place]) 04/20/2017  . 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 Malnutrition of moderate degree 04/20/2017  . Complicated UTI (urinary tract infection)   . 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 Rectal bleeding 09/28/2016  . Hyponatremia 04/28/2015  . Abdominal pain 04/27/2015  . Acute pancreatitis 04/27/2015  .  Chronic alcoholic pancreatitis ([place]) 04/27/2015  . ETOH abuse 04/27/2015  . DM type 2 (diabetes mellitus, type 2) ([place]) 04/27/2015  . Hypertension, uncontrolled 04/27/2015  . Hyperlipidemia 04/27/2015  . Hypertensive urgency 04/27/2015  . Alcohol abuse     Rayetta Humphrey, [place] CLT [phone] 08/01/2018, 2:23 PM  [facility] [place], [place], [address] Phone: [phone]   Fax:  [phone]  Name: Colin Wagner MRN: [id number] Date of Birth: Aug 23, 1951

## 2018-08-03 ENCOUNTER — Encounter (HOSPITAL_COMMUNITY): Payer: Self-pay | Admitting: Physical Therapy

## 2018-08-03 ENCOUNTER — Ambulatory Visit (HOSPITAL_COMMUNITY): Payer: Medicare Other | Admitting: Physical Therapy

## 2018-08-03 DIAGNOSIS — I890 Lymphedema, not elsewhere classified: Secondary | ICD-10-CM | POA: Diagnosis not present

## 2018-08-03 NOTE — Therapy (Signed)
[place], [place], [address] Phone: [phone]   Fax:  [phone]  Physical Therapy Treatment  Patient Details  Name: Colin Wagner MRN: [id number] Date of Birth: Feb 28, 1951 Referring Provider (PT): [name]    Encounter Date: 08/03/2018  PT End of Session - 08/03/18 1513    Visit Number  31    Number of Visits  32    Date for PT Re-Evaluation  07/20/18    Authorization - Visit Number  54    Authorization - Number of Visits  32    PT Start Time  1430    PT Stop Time  1510    PT Time Calculation (min)  40 min    Activity Tolerance  Patient tolerated treatment well    Behavior During Therapy  [facility] for tasks assessed/performed       Past Medical History:  Diagnosis Date  . Alcohol abuse   . Alcoholic pancreatitis 5670   admission  . Chronic back pain   . Chronic pancreatitis ([place])    based on ct findings 2016  . COPD (chronic obstructive pulmonary disease) ([place])   . Diabetes mellitus   . Diverticulosis   . Gastritis   . GERD (gastroesophageal reflux disease)   . Headache   . History of radiation therapy 05/12/17- 06/22/17   Left cheek and bilateral neck/ 60 Gy in 30 fractions to gross disease  . Hyperlipidemia   . Hypertension   . Peptic ulcer disease 1999   Per medical reports, no H pylori  . Renal cancer, left ([place]) 2012   he tells me that he has been released, ?and that he is free of cancer, and never had it to begin with.   . Right shoulder pain   . Testicular hypofunction     Past Surgical History:  Procedure Laterality Date  . COLONOSCOPY  2003   Dr. [name], polyps  . COLONOSCOPY  2005   Dr. [name], multiple diverticula  . COLONOSCOPY  2008   Dr. [name], diverticulosis  . COLONOSCOPY WITH PROPOFOL N/A 10/21/2016   Dr. [name]: Diverticulosis, two 5-7 mm polyps removed. path-tubular adenomas.  Next colonoscopy in 5 years.  . ESOPHAGOGASTRODUODENOSCOPY     Multiple EGDs.  1999 EGD showed gastric ulcers, no H pylori and benign biopsies performed by Dr. [name]. 2001 gastric ulcer healed. Last EGD 2005 had gastritis.  . IR REPLC GASTRO/COLONIC TUBE PERCUT W/FLUORO  05/16/2018  . Left partial mandibulectomy, Scapular free flap reconstruction, selective neck dissection, tracheotomy, and resection of intraoral palate cancer. Left 03/01/2017   [facility]  . LUNG BIOPSY    . PARTIAL NEPHRECTOMY Left 2012  . POLYPECTOMY  10/21/2016   Procedure: POLYPECTOMY;  Surgeon: [name], MD;  [facility];  Service: Endoscopy;;  hepatic flexure x2    There were no vitals filed for this visit.  Subjective Assessment - 08/03/18 1431    Subjective  PT has no questions on home self care     Pertinent History  renal cancer, Cancer of Lt mandible with LT partial mandibulectomy on 03/01/2017, DM ,HTN     Currently in Pain?  No/denies    Pain Onset  More than a month ago           [facility] Adult PT Treatment/Exercise - 08/03/18 0001      Exercises   Exercises  Neck ROM all x 5 reps followed by passive  Manual Therapy   Manual Therapy  Myofascial release;Manual Lymphatic Drainage (MLD);Compression Bandaging;Muscle Energy Technique;Taping    Manual therapy comments  completed seperate from all other aspects of treatment     Myofascial Release  for scar under mandibular.     Manual Lymphatic Drainage (MLD)   cervical and facial to include internal mouth,  lip and eye area.                PT Short Term Goals - 06/29/18 1212      PT SHORT TERM GOAL #1   Title  Pt pain to be no greater than a 5/10 to allow pt to turn his head to at least 50 degrees in both directions  to assist in safety while driving    Time  3    Period  Weeks    Status  Partially Met   s      PT SHORT TERM GOAL #2   Title  PT to be able to open his mouth 3 cm to make eating easier.     Time  3   2 weeks form now =    Period  Weeks    Status   Revised   goal 3.0cm      PT SHORT TERM GOAL #3   Title  Pt measurements to have improved by .5 cm to allow pt to have improved self image.     Time  2   from reassessment.    Period  Weeks    Status  On-going   exacerbated edema due to surgery       PT Long Term Goals - 06/29/18 1212      PT LONG TERM GOAL #1   Title  PT to be able to turn his head 60 degrees or more in both directions to be able to see blind spot while driving    Time  6   now 10 weeks    Period  Weeks    Status  On-going      PT LONG TERM GOAL #2   Title  PT measurement to be decreased 1. cm to demonstrate decreased facial and cervical edema to improve swallowing.     Time  6   now 10 weeks    Period  Weeks    Status  Not Met   exacerbated since surgery      PT LONG TERM GOAL #3   Title  PT to be wearing his compression for 20 mintues 2x a day at least 5 days a week to keep edema down.     Time  6   now 10 weeks    Period  Weeks    Status  On-going   PT stopped using due to surgery but has started back.      PT LONG TERM GOAL #4   Title  Pt to be able to demonstrate self massaging techniques to increase lymphatic flow to decrease swelling.     Time  6    Period  Weeks    Status  Achieved            Plan - 08/03/18 1511    Clinical Impression Statement  Pt continues to self treat at home.  We will wait a week to see pt back and measure.  If volumes are stable with pt self treatment we will discharge from formal skilled therapy.     Rehab Potential  Good    PT Frequency  3x /  week    PT Duration  --   additional 4 weeks.  Pt had 6 weeks of therapy, had a stent placed which has exacerbated the edema.    PT Treatment/Interventions  ADLs/Self Care Home Management;Therapeutic exercise;Patient/family education;Manual techniques;Compression bandaging    PT Next Visit Plan  measure next visit; if volumes are stable discharge to self care.     PT Home Exercise Plan  cervical ROM ; thoracic  decongestion.     Consulted and Agree with Plan of Care  Patient       Patient will benefit from skilled therapeutic intervention in order to improve the following deficits and impairments:  Decreased activity tolerance, Decreased scar mobility, Decreased range of motion, Decreased mobility, Pain, Postural dysfunction, Increased edema  Visit Diagnosis: Lymphedema, not elsewhere classified     Problem List Patient Active Problem List   Diagnosis Date Noted  . Attention to G-tube ([place]) 02/08/2018  . UTI (urinary tract infection) 04/20/2017  . UTI due to Klebsiella species 04/20/2017  . Essential hypertension 04/20/2017  . Chronic pancreatitis ([place]) 04/20/2017  . 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 Malnutrition of moderate degree 04/20/2017  . Complicated UTI (urinary tract infection)   . 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 Rectal bleeding 09/28/2016  . Hyponatremia 04/28/2015  . Abdominal pain 04/27/2015  . Acute pancreatitis 04/27/2015  . Chronic alcoholic pancreatitis ([place]) 04/27/2015  . ETOH abuse 04/27/2015  . DM type 2 (diabetes mellitus, type 2) ([place]) 04/27/2015  . Hypertension, uncontrolled 04/27/2015  . Hyperlipidemia 04/27/2015  . Hypertensive urgency 04/27/2015  . Alcohol abuse     Rayetta Humphrey, [place] CLT [phone] 08/03/2018, 3:14 PM  [place] [address] [place], [place], [address] Phone: [phone]   Fax:  [phone]  Name: Colin Wagner MRN: [id number] Date of Birth: 03/26/1951

## 2018-08-15 ENCOUNTER — Encounter (HOSPITAL_COMMUNITY): Payer: Self-pay | Admitting: Physical Therapy

## 2018-08-15 ENCOUNTER — Telehealth (HOSPITAL_COMMUNITY): Payer: Self-pay | Admitting: Physical Therapy

## 2018-08-15 ENCOUNTER — Ambulatory Visit (HOSPITAL_COMMUNITY): Payer: Medicare Other | Admitting: Physical Therapy

## 2018-08-15 DIAGNOSIS — I890 Lymphedema, not elsewhere classified: Secondary | ICD-10-CM

## 2018-08-15 NOTE — Therapy (Addendum)
[place], [place], [address] Phone: [phone]   Fax:  [phone]  Physical Therapy Treatment/discharge   Patient Details  Name: Colin Wagner MRN: [id number] Date of Birth: [birth year] Referring Provider (PT): [name]   PHYSICAL THERAPY DISCHARGE SUMMARY  Visits from Start of Care: 32  Current functional level related to goals / functional outcomes: See below   Remaining deficits: Pt ROM is improved but not normal; edema is improved but not obliterated.    Education / Equipment: Exercises as well as self massage and compression.  Plan: Patient agrees to discharge.  Patient goals were partially met. Patient is being discharged due to being pleased with the current functional level.  ?????       Encounter Date: 08/15/2018  PT End of Session - 08/15/18 1155    Visit Number  32    Number of Visits  32    Date for PT Re-Evaluation  07/20/18    Authorization - Visit Number  56    Authorization - Number of Visits  32    PT Start Time  0815    PT Stop Time  0900    PT Time Calculation (min)  45 min    Activity Tolerance  Patient tolerated treatment well    Behavior During Therapy  WFL for tasks assessed/performed       Past Medical History:  Diagnosis Date  . Alcohol abuse   . Alcoholic pancreatitis 4259   admission  . Chronic back pain   . Chronic pancreatitis ([place])    based on ct findings 2016  . COPD (chronic obstructive pulmonary disease) ([place])   . Diabetes mellitus   . Diverticulosis   . Gastritis   . GERD (gastroesophageal reflux disease)   . Headache   . History of radiation therapy 05/12/17- 06/22/17   Left cheek and bilateral neck/ 60 Gy in 30 fractions to gross disease  . Hyperlipidemia   . Hypertension   . Peptic ulcer disease 1999   Per medical reports, no H pylori  . Renal cancer, left ([place]) 2012   he tells me that he has been released, ?and that he is free of cancer,  and never had it to begin with.   . Right shoulder pain   . Testicular hypofunction     Past Surgical History:  Procedure Laterality Date  . COLONOSCOPY  2003   Dr. [name], polyps  . COLONOSCOPY  2005   Dr. [name], multiple diverticula  . COLONOSCOPY  2008   Dr. [name], diverticulosis  . COLONOSCOPY WITH PROPOFOL N/A 10/21/2016   Dr. [name]: Diverticulosis, two 5-7 mm polyps removed. path-tubular adenomas.  Next colonoscopy in 5 years.  . ESOPHAGOGASTRODUODENOSCOPY     Multiple EGDs. 1999 EGD showed gastric ulcers, no H pylori and benign biopsies performed by Dr. [name]. 2001 gastric ulcer healed. Last EGD 2005 had gastritis.  . IR REPLC GASTRO/COLONIC TUBE PERCUT W/FLUORO  05/16/2018  . Left partial mandibulectomy, Scapular free flap reconstruction, selective neck dissection, tracheotomy, and resection of intraoral palate cancer. Left 03/01/2017   [facility]  . LUNG BIOPSY    . PARTIAL NEPHRECTOMY Left 2012  . POLYPECTOMY  10/21/2016   Procedure: POLYPECTOMY;  Surgeon: [name], MD;  [facility];  Service: Endoscopy;;  hepatic flexure x2    There were no vitals filed for this visit.  Subjective Assessment - 08/15/18 1154  Subjective  PT states that he is doing his exercises and self massaging at home.  His Jaw feels much better than prior to therapy.       Pertinent History  renal cancer, Cancer of Lt mandible with LT partial mandibulectomy on 03/01/2017, DM ,HTN     Currently in Pain?  No/denies    Pain Onset  More than a month ago         [facility] PT Assessment - 08/15/18 0001      Assessment   Medical Diagnosis  cervical lymphedema    Referring Provider (PT)  [name]     Onset Date/Surgical Date  01/21/18    Prior Therapy  none for this issue       Precautions   Precautions  None      Restrictions   Weight Bearing Restrictions  No      [place] residence      Prior  Function   Level of Independence  Independent      Cognition   Overall Cognitive Status  Within Functional Limits for tasks assessed      AROM   Cervical Flexion  50   inital 40 visit 20 55   Cervical Extension  40   inital 30 visit 20 30   Cervical - Right Side Bend  20   initial 20 visit 10 20   Cervical - Left Side Bend  30   inital 16; visit 20 30   Cervical - Right Rotation  53   inital 26 visit 20 45   Cervical - Left Rotation  50   inital38; visit 20 50      Life impact was 51 now 22   LYMPHEDEMA/ONCOLOGY QUESTIONNAIRE - 08/15/18 0813      Head and Neck   Right Lateral Nostril at base of nose to medial tragus   10.4 cm  Initial 11.3   Left Lateral Nostril at base of nose to medial tragus   11 cm  Initial 11.0   Right Corner of mouth to where ear lobe meets face  10 cm  Initial 10.4   Left Corner of mouth to where ear lobe meets face  11 cm  iniial 12   4 cm superior to sternal notch around neck  39 cm  Initial 40.5   6 cm superior to sternal notch around neck  39.5 cm initial 42.1                [facility] Adult PT Treatment/Exercise - 08/15/18 0001      Manual Therapy   Manual Therapy  Myofascial release;Manual Lymphatic Drainage (MLD)    Manual therapy comments  completed seperate from all other aspects of treatment     Myofascial Release  for scar under mandibular.     Manual Lymphatic Drainage (MLD)  myofascial completed for scars with decongestive techniqes done for facial swelling                PT Short Term Goals - 08/15/18 1157      PT SHORT TERM GOAL #1   Title  Pt pain to be no greater than a 5/10 to allow pt to turn his head to at least 50 degrees in both directions  to assist in safety while driving    Time  3    Period  Weeks    Status  Achieved   s      PT SHORT TERM  GOAL #2   Title  PT to be able to open his mouth 3 cm to make eating easier.     Time  3   2 weeks form now =    Period  Weeks    Status  Achieved   goal 3.0cm       PT SHORT TERM GOAL #3   Title  Pt measurements to have improved by .5 cm to allow pt to have improved self image.     Time  2   from reassessment.    Period  Weeks    Status  Achieved   exacerbated edema due to surgery       PT Long Term Goals - 08/15/18 1158      PT LONG TERM GOAL #1   Title  PT to be able to turn his head 60 degrees or more in both directions to be able to see blind spot while driving    Baseline  pt currently able to turn his head to 55 degrees     Time  6   now 10 weeks    Period  Weeks    Status  On-going      PT LONG TERM GOAL #2   Title  PT measurement to be decreased 1. cm to demonstrate decreased facial and cervical edema to improve swallowing.     Time  6   now 10 weeks    Period  Weeks    Status  Achieved   exacerbated since surgery      PT LONG TERM GOAL #3   Title  PT to be wearing his compression for 20 mintues 2x a day at least 5 days a week to keep edema down.     Time  6   now 10 weeks    Period  Weeks    Status  Achieved   PT stopped using due to surgery but has started back.      PT LONG TERM GOAL #4   Title  Pt to be able to demonstrate self massaging techniques to increase lymphatic flow to decrease swelling.     Time  6    Period  Weeks    Status  Achieved            Plan - 08/15/18 1156    Clinical Impression Statement  PT reassessed after a week of not having formal therapy.  Pt volume has not increased.  Pt is completing manual at least twice a day and compression at least once a day.  Mr. Hollingshed is ready for discharge to self care.      Rehab Potential  Good    PT Frequency  3x / week    PT Duration  --   additional 4 weeks.  Pt had 6 weeks of therapy, had a stent placed which has exacerbated the edema.    PT Treatment/Interventions  ADLs/Self Care Home Management;Therapeutic exercise;Patient/family education;Manual techniques;Compression bandaging    PT Next Visit Plan  Discharge.     PT Home Exercise Plan   cervical ROM ; thoracic decongestion.     Consulted and Agree with Plan of Care  Patient       Patient will benefit from skilled therapeutic intervention in order to improve the following deficits and impairments:  Decreased activity tolerance, Decreased scar mobility, Decreased range of motion, Decreased mobility, Pain, Postural dysfunction, Increased edema  Visit Diagnosis: Lymphedema, not elsewhere classified     Problem List Patient Active Problem  List   Diagnosis Date Noted  . Attention to G-tube ([place]) 02/08/2018  . UTI (urinary tract infection) 04/20/2017  . UTI due to Klebsiella species 04/20/2017  . Essential hypertension 04/20/2017  . Chronic pancreatitis ([place]) 04/20/2017  . 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 Malnutrition of moderate degree 04/20/2017  . Complicated UTI (urinary tract infection)   . 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 Rectal bleeding 09/28/2016  . Hyponatremia 04/28/2015  . Abdominal pain 04/27/2015  . Acute pancreatitis 04/27/2015  . Chronic alcoholic pancreatitis ([place]) 04/27/2015  . ETOH abuse 04/27/2015  . DM type 2 (diabetes mellitus, type 2) ([place]) 04/27/2015  . Hypertension, uncontrolled 04/27/2015  . Hyperlipidemia 04/27/2015  . Hypertensive urgency 04/27/2015  . Alcohol abuse    Rayetta Humphrey, [place] CLT [phone] 08/15/2018, 12:00 PM  [place], [place], [address] Phone: [phone]   Fax:  [phone]  Name: Colin Wagner MRN: [id number] Date of Birth: 30-May-1951

## 2018-08-15 NOTE — Telephone Encounter (Signed)
Patient was discharged from rehab per Azucena Freed PT

## 2018-08-18 ENCOUNTER — Ambulatory Visit (HOSPITAL_COMMUNITY): Payer: Medicare Other | Admitting: Physical Therapy

## 2018-08-22 ENCOUNTER — Ambulatory Visit (HOSPITAL_COMMUNITY): Payer: Medicare Other | Admitting: Physical Therapy

## 2018-08-24 ENCOUNTER — Encounter (HOSPITAL_COMMUNITY): Payer: Self-pay | Admitting: Physical Therapy

## 2018-09-06 NOTE — Progress Notes (Signed)
Colin Wagner presents for follow up of radiation completed 06/22/17 to his left cheek and bilateral neck.   Pain issues, if any: Colin Wagner denies.  Using a feeding tube?: Yes, for all nutrition.  Weight changes, if any:  Wt Readings from Last 3 Encounters:  09/08/18 183 lb 3.2 oz (83.1 kg)  05/15/18 184 lb (83.5 kg)  04/14/18 186 lb 3.2 oz (84.5 kg)   Swallowing issues, if any: Colin Wagner has trouble manipulating food in his mouth. Colin Wagner is eating foods with a lot of gravy, macaroni and cheese. Food that is too coarse Colin Wagner is unable to swallow. Colin Wagner still has difficulty tasting foods. Colin Wagner is drinking liquids orally.  Smoking or chewing tobacco? No Using fluoride trays daily? N/A Last ENT visit was on: Dr. Benjamine Mola 05/11/18 Other notable issues, if any:  Dr. Vertell Limber next on 11/20/18.  Colin Wagner was called on 09/02/18 by Dr. Vertell Limber to inform him of a negative CT neck/ thyroid.   BP 136/77 (BP Location: Right Arm)   Pulse (!) 103   Temp 98 F (36.7 C) (Oral)   Wt 183 lb 3.2 oz (83.1 kg)   SpO2 99% Comment: room air  BMI 25.55 kg/m

## 2018-09-08 ENCOUNTER — Encounter: Payer: Self-pay | Admitting: Radiation Oncology

## 2018-09-08 ENCOUNTER — Ambulatory Visit
Admission: RE | Admit: 2018-09-08 | Discharge: 2018-09-08 | Disposition: A | Discharge status: Home / Self Care | Payer: Medicare Other | Source: Ambulatory Visit | Attending: Radiation Oncology | Admitting: Radiation Oncology

## 2018-09-08 ENCOUNTER — Other Ambulatory Visit: Payer: Self-pay

## 2018-09-08 VITALS — BP 136/77 | HR 103 | Temp 98.0°F | Wt 183.2 lb

## 2018-09-08 DIAGNOSIS — Z7984 Long term (current) use of oral hypoglycemic drugs: Secondary | ICD-10-CM | POA: Insufficient documentation

## 2018-09-08 DIAGNOSIS — Z923 Personal history of irradiation: Secondary | ICD-10-CM | POA: Diagnosis not present

## 2018-09-08 DIAGNOSIS — Z1329 Encounter for screening for other suspected endocrine disorder: Secondary | ICD-10-CM

## 2018-09-08 DIAGNOSIS — C060 Malignant neoplasm of cheek mucosa: Secondary | ICD-10-CM | POA: Diagnosis not present

## 2018-09-08 DIAGNOSIS — Z79899 Other long term (current) drug therapy: Secondary | ICD-10-CM | POA: Diagnosis not present

## 2018-09-08 DIAGNOSIS — F17210 Nicotine dependence, cigarettes, uncomplicated: Secondary | ICD-10-CM | POA: Diagnosis not present

## 2018-09-08 NOTE — Progress Notes (Addendum)
Radiation Oncology         [phone] ________________________________  Name: Colin Wagner MRN: [id number]  Date: 09/08/2018  DOB: Aug 08, 1951  Follow-Up Visit Note  CC: [name], MD  [name], MD  Diagnosis and Prior Radiotherapy:       ICD-10-CM   1. Screening for hypothyroidism Z13.29 TSH  2. 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 He is accompanied by his family today.  Mr. Ritson presents for follow up of radiation completed 06/22/17 to his left cheek and bilateral neck.   Pain issues, if any: he denies.  Using a feeding tube?: Yes  Weight changes, if any:  Wt Readings from Last 3 Encounters:  09/08/18 183 lb 3.2 oz (83.1 kg)  05/15/18 184 lb (83.5 kg)  04/14/18 186 lb 3.2 oz (84.5 kg)   Swallowing issues, if any: He has trouble manipulating food in his mouth. He is eating foods with a lot of gravy, macaroni and cheese. Food that is too coarse he is unable to swallow. He still has difficulty tasting foods. He is drinking liquids orally.  Smoking or chewing tobacco? No Using fluoride trays daily? N/A, and not up to date on dental visits. Last ENT visit was on: Dr. Benjamine Mola 05/11/18 Other notable issues, if any:  Dr. Vertell Limber next on 11/20/18.  He was called on 09/02/18 by Dr. Vertell Limber to inform him of a negative CT neck/ thyroid done at [facility]. Report negative.Stable treatment related changes in the left neck. No specific evidence of recurrent tumor. No lymphadenopathy in the neck. Interval stenting of the right ICA, patent.  Similar severe left subclavian artery stenosis proximal to the left vertebral artery origin with poor opacification of the left vertebral artery origin and V1 segment. Similar left mastoid effusion.    Weight changes, if any:  Wt Readings from Last 3 Encounters:  09/08/18 183 lb 3.2 oz (83.1 kg)  05/15/18 184 lb (83.5 kg)  04/14/18 186 lb 3.2 oz (84.5 kg)     ALLERGIES:  has No Known Allergies.  Meds: Current Outpatient Medications  Medication Sig Dispense Refill  . Acetaminophen (TYLENOL 8 HOUR PO) 2 tablets by Feeding Tube route 2 (two) times daily as needed.     Marland Kitchen atorvastatin (LIPITOR) 40 MG tablet Take 40 mg by mouth daily at 6 PM.     . B-D UF III MINI PEN NEEDLES 31G X 5 MM MISC   3  . B-D ULTRAFINE III SHORT PEN 31G X 8 MM MISC     . clopidogrel (PLAVIX) 75 MG tablet TAKE 1 TABLET (75 MG TOTAL) BY MOUTH DAILY FOR 30 DAYS.  5  . cyclobenzaprine (FLEXERIL) 10 MG tablet     . fluticasone (FLONASE) 50 MCG/ACT nasal spray NASAL USE 2 SPRAYS IN EACH NOSTRIL ONCE A DAY TO PREVENT NASAL ALLERGIES.    Marland Kitchen glipiZIDE (GLUCOTROL XL) 2.5 MG 24 hr tablet Take 2.5 mg by mouth every morning.    . hydrochlorothiazide (HYDRODIURIL) 12.5 MG tablet TAKE 1 TABLET EACH DAY FOR  HIGH BLOOD PRESSURE.  11  . HYDROcodone-acetaminophen (NORCO) 7.5-325 MG tablet Take 1 tablet by mouth every 6 (six) hours as needed (pain). (Patient taking differently: 1 tablet every 6 (six) hours as needed (pain). ) 60 tablet 0  . ibuprofen (ADVIL,MOTRIN) 100 MG/5ML suspension Place 20 mLs (400 mg total) into feeding tube every 6 (six) hours as needed. Take with food. 473 mL 4  . lidocaine (XYLOCAINE) 2 % solution Patient: Mix 1part 2% viscous lidocaine, 1part H20. Swish &/or swallow 65mL of diluted mixture, 53min before meals and at bedtime, up to QID 100 mL 5  . lisinopril (PRINIVIL,ZESTRIL) 5 MG tablet Take 5 mg by mouth daily.     Marland Kitchen loratadine (CLARITIN) 10 MG tablet Take 10 mg by mouth daily.    Marland Kitchen LORazepam (ATIVAN) 1 MG  tablet Take 1 mg by mouth 4 (four) times daily as needed for anxiety. For anxiety  5  . metFORMIN (GLUCOPHAGE-XR) 500 MG 24 hr tablet Take 1-2 tablets by mouth 3 (three) times daily. Takes 2 tablets in the morning, 1 tablet at lunch and 1 tablet at dinner  3  . pantoprazole (PROTONIX) 20 MG tablet Take 20 mg by mouth daily.     Marland Kitchen senna (SENOKOT) 8.6 MG TABS tablet Take 1 tablet (8.6 mg total) by mouth at bedtime as needed for mild constipation. May be needed as pain medication can constipate. 120 each 1  . sildenafil (REVATIO) 20 MG tablet TAKE UP TO FIVE TABLETS BY MOUTH ONE HOUR PRIOR TO INTERCOURSE    . tamsulosin (FLOMAX) 0.4 MG CAPS capsule TAKE ONE CAPSULE BY MOUTH EVERY DAY FOR PROSTATE  3  . testosterone cypionate (DEPOTESTOSTERONE CYPIONATE) 200 MG/ML injection Inject 100 mg into the muscle every 14 (fourteen) days.     . traZODone (DESYREL) 50 MG tablet Take 50 mg by mouth at bedtime.    . Water For Irrigation, Sterile (FREE WATER) SOLN Place 100 mLs into feeding tube every 8 (eight) hours. 1000 mL 0  . albuterol (PROVENTIL HFA;VENTOLIN HFA) 108 (90 Base) MCG/ACT inhaler Inhale 1-2 puffs into the lungs every 6 (six) hours as needed for wheezing or shortness of breath. (Patient not taking: Reported on 09/08/2018) 1 Inhaler 0  . emollient (BIAFINE) cream Apply topically as needed. (Patient not taking: Reported on 09/08/2018) 454 g 0   No current facility-administered medications for this encounter.     Physical Findings: Wt Readings from Last 3 Encounters:  09/08/18 183 lb 3.2 oz (83.1 kg)  05/15/18 184 lb (83.5 kg)  04/14/18 186 lb 3.2 oz (84.5 kg)    weight is 183 lb 3.2 oz (83.1 kg). His oral temperature is 98 F (36.7 C). His blood pressure is 136/77 and his pulse is 103 (abnormal). His oxygen saturation is 99%.   General: Alert and oriented, in no acute distress. HEENT: The tissue flap appears healthy. He has a very dry mouth. Lymphedema in the left face particularly along the left  jaw all above the surgical scar in the left neck - improved. There is a tiny 68mm area of what appears to be bone, raised in the inner lower left gingiva with a thin layer of overlying mucosa Neck: He has fibrosis concentrated in the left neck consistent with prior surgery and radiation. No palpable masses noted.  Heart: Regular in rate and rhythm with no murmurs, rubs, or gallops. Chest: Clear to auscultation bilaterally, with no rhonchi, wheezes, or rales.  Skin: intact in RT fields Psych: appropriate affect, judgment  appears intact    Lab Findings: Lab Results  Component Value Date   WBC 7.4 04/25/2017   HGB 9.0 (L) 04/25/2017   HCT 27.4 (L) 04/25/2017   MCV 80.8 04/25/2017   PLT 352 04/25/2017    Lab Results  Component Value Date   TSH 1.451 04/14/2018    Radiographic Findings: No results found.  Impression/Plan:    1) Head and Neck Cancer Status: NED   2) Nutritional Status: stable, still needs PEG  3) Risk Factors: The patient has been educated about risk factors including alcohol and tobacco abuse; they understand that avoidance of alcohol and tobacco is important to prevent recurrences as well as other cancers. The patient is not using tobacco.    4) Swallowing: limited to certain foods  5) Dental: Encouraged to continue regular followup with dentistry, and dental hygiene including fluoride and flossing. Advised to make dental appt for cleanings - overdue.  6) Thyroid function: wnl Lab Results  Component Value Date   TSH 1.451 04/14/2018    7) Other:  There is a tiny 52mm area of what appears to be bone, raised in the inner lower left gingiva with a thin layer of overlying mucosa - showed this to his family, advised them to show to ENT and let MDs know particularly if bone becomes exposed.  8) ENT: Follow up as scheduled  9) Follow-up here in August 2020 with TSH.   _____________________________________   [name], MD  This document serves as a record  of services personally performed by [name], MD. It was created on her behalf by [facility], a trained medical scribe. The creation of this record is based on the scribe's personal observations and the provider's statements to them. This document has been checked and approved by the attending provider.

## 2018-09-11 ENCOUNTER — Telehealth: Payer: Self-pay | Admitting: *Deleted

## 2018-09-11 NOTE — Telephone Encounter (Signed)
CALLED PATIENT TO INFORM OF LAB ON 04/20/19 PRIOR TO FU ON 04/20/19 @ 1:30 PM, SPOKE WITH PATIENT'S FRIEND [name] AND SHE IS AWARE OF THIS APPT.

## 2018-11-08 ENCOUNTER — Encounter (HOSPITAL_COMMUNITY): Payer: Self-pay | Admitting: Emergency Medicine

## 2018-11-08 ENCOUNTER — Other Ambulatory Visit: Payer: Self-pay

## 2018-11-08 ENCOUNTER — Emergency Department (HOSPITAL_COMMUNITY)
Admission: EM | Admit: 2018-11-08 | Discharge: 2018-11-08 | Disposition: A | Discharge status: Home / Self Care | Payer: Medicare Other | Attending: Emergency Medicine | Admitting: Emergency Medicine

## 2018-11-08 DIAGNOSIS — N3000 Acute cystitis without hematuria: Secondary | ICD-10-CM | POA: Insufficient documentation

## 2018-11-08 DIAGNOSIS — E119 Type 2 diabetes mellitus without complications: Secondary | ICD-10-CM | POA: Insufficient documentation

## 2018-11-08 DIAGNOSIS — Z87891 Personal history of nicotine dependence: Secondary | ICD-10-CM | POA: Insufficient documentation

## 2018-11-08 DIAGNOSIS — J449 Chronic obstructive pulmonary disease, unspecified: Secondary | ICD-10-CM | POA: Insufficient documentation

## 2018-11-08 DIAGNOSIS — R339 Retention of urine, unspecified: Secondary | ICD-10-CM

## 2018-11-08 DIAGNOSIS — Z79899 Other long term (current) drug therapy: Secondary | ICD-10-CM | POA: Insufficient documentation

## 2018-11-08 DIAGNOSIS — Z7984 Long term (current) use of oral hypoglycemic drugs: Secondary | ICD-10-CM | POA: Diagnosis not present

## 2018-11-08 DIAGNOSIS — I10 Essential (primary) hypertension: Secondary | ICD-10-CM | POA: Insufficient documentation

## 2018-11-08 LAB — URINALYSIS, ROUTINE W REFLEX MICROSCOPIC
Bilirubin Urine: NEGATIVE
Glucose, UA: NEGATIVE mg/dL
HGB URINE DIPSTICK: NEGATIVE
Ketones, ur: NEGATIVE mg/dL
Nitrite: NEGATIVE
Protein, ur: NEGATIVE mg/dL
Specific Gravity, Urine: 1.013 (ref 1.005–1.030)
pH: 8 (ref 5.0–8.0)

## 2018-11-08 MED ORDER — CEPHALEXIN 500 MG PO CAPS
500.0000 mg | ORAL_CAPSULE | Freq: Once | ORAL | Status: AC
  Administered 2018-11-08: 500 mg via ORAL
  Filled 2018-11-08: qty 1

## 2018-11-08 MED ORDER — CEPHALEXIN 500 MG PO CAPS: 500.0000 mg | ORAL_CAPSULE | Freq: Four times a day (QID) | ORAL | 0 refills | Status: DC

## 2018-11-08 NOTE — Discharge Instructions (Addendum)
Keep your appointment the chart have scheduled to follow-up with urology.  The Foley catheter in the leg bag will probably need to stay in place for a few days due to the retention.  Urine not consistent or concerning for urinary tract infection.  Culture sent.  You will be notified if the antibiotic we have picked to put you on does not cover the infection.  Take the antibiotic Keflex as directed for the next 7 days.  Return for any new or worse symptoms.  First dose of antibiotic given here tonight.  Empty the Foley catheter leg bag when he gets full.  Just on put into the toilet and flush.

## 2018-11-08 NOTE — ED Triage Notes (Signed)
Problems voiding, gotten worse today, lower bladder pain, void small amount this morning

## 2018-11-08 NOTE — ED Provider Notes (Signed)
[facility] EMERGENCY DEPARTMENT Provider Note   CSN: [id number] Arrival date & time: 11/08/18  1716    History   Chief Complaint Chief Complaint  Patient presents with  . urine retention    HPI Colin Wagner is a 68 y.o. male.     Patient presents for difficulty of voiding.  He feels as if his bladder is distended.  Last time he voided was this morning.  It was only a small amount.  Patient several years ago had trouble with urinary retention but none since.  Patient states he has follow-up with urology at [facility] urology in [place] already scheduled for tomorrow.  But when this happened he is too uncomfortable.  Patient's past medical history significant for alcohol abuse alcoholic pancreatitis chronic back pain.  Chronic pancreatitis COPD diabetes.  However patient states that all the problem here today is just in that lower part of the abdomen and that he feels as if his bladder is distended and is unable to void.     Past Medical History:  Diagnosis Date  . Alcohol abuse   . Alcoholic pancreatitis 4098   admission  . Chronic back pain   . Chronic pancreatitis ([place])    based on ct findings 2016  . COPD (chronic obstructive pulmonary disease) ([place])   . Diabetes mellitus   . Diverticulosis   . Gastritis   . GERD (gastroesophageal reflux disease)   . Headache   . History of radiation therapy 05/12/17- 06/22/17   Left cheek and bilateral neck/ 60 Gy in 30 fractions to gross disease  . Hyperlipidemia   . Hypertension   . Peptic ulcer disease 1999   Per medical reports, no H pylori  . 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   . Right shoulder pain   . Testicular hypofunction     Patient Active Problem List   Diagnosis Date Noted  . Attention to G-tube ([place]) 02/08/2018  . UTI (urinary tract infection) 04/20/2017  . UTI due to Klebsiella species 04/20/2017  .  Essential hypertension 04/20/2017  . Chronic pancreatitis ([place]) 04/20/2017  . 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 Malnutrition of moderate degree 04/20/2017  . Complicated UTI (urinary tract infection)   . Carcinoma of buccal mucosa ([place]) 01/05/2017  . Rectal bleeding 09/28/2016  . Hyponatremia 04/28/2015  . Abdominal pain 04/27/2015  . Acute pancreatitis 04/27/2015  . Chronic alcoholic pancreatitis ([place]) 04/27/2015  . ETOH abuse 04/27/2015  . DM type 2 (diabetes mellitus, type 2) ([place]) 04/27/2015  . Hypertension, uncontrolled 04/27/2015  . Hyperlipidemia 04/27/2015  . Hypertensive urgency 04/27/2015  . Alcohol abuse     Past Surgical History:  Procedure Laterality Date  . COLONOSCOPY  2003   Dr. [name], polyps  . COLONOSCOPY  2005   Dr. [name], multiple diverticula  . COLONOSCOPY  2008   Dr. [name], diverticulosis  . COLONOSCOPY WITH PROPOFOL N/A 10/21/2016   Dr. [name]: Diverticulosis, two 5-7 mm polyps removed. path-tubular adenomas.  Next colonoscopy in 5 years.  . ESOPHAGOGASTRODUODENOSCOPY     Multiple EGDs. 1999 EGD showed gastric ulcers, no H pylori and benign biopsies performed by Dr. [name]. 2001 gastric ulcer healed. Last EGD 2005 had gastritis.  . IR REPLC GASTRO/COLONIC TUBE PERCUT W/FLUORO  05/16/2018  . Left partial mandibulectomy, Scapular free flap reconstruction, selective neck dissection, tracheotomy,  and resection of intraoral palate cancer. Left 03/01/2017   [facility]  . LUNG BIOPSY    . PARTIAL NEPHRECTOMY Left 2012  . POLYPECTOMY  10/21/2016   Procedure: POLYPECTOMY;  Surgeon: [name], MD;  Location: [facility] ENDO SUITE;  Service: Endoscopy;;  hepatic flexure x2        Home Medications    Prior to Admission medications   Medication Sig Start Date End Date Taking? Authorizing Provider  clopidogrel (PLAVIX) 75 MG tablet TAKE 1 TABLET (75 MG TOTAL) BY MOUTH DAILY FOR 30 DAYS. 07/24/18  Yes  [provider]  cyclobenzaprine (FLEXERIL) 10 MG tablet Take 10 mg by mouth 3 (three) times daily as needed.  09/06/18  Yes [provider]  fluticasone (FLONASE) 50 MCG/ACT nasal spray NASAL USE 2 SPRAYS IN EACH NOSTRIL ONCE A DAY TO PREVENT NASAL ALLERGIES. 08/04/18  Yes [provider]  glipiZIDE (GLUCOTROL XL) 2.5 MG 24 hr tablet Take 2.5 mg by mouth every morning. 08/11/18  Yes [provider]  hydrochlorothiazide (HYDRODIURIL) 12.5 MG tablet TAKE 1 TABLET EACH DAY FOR HIGH BLOOD PRESSURE. 07/25/18  Yes [provider]  HYDROcodone-acetaminophen (NORCO) 7.5-325 MG tablet Take 1 tablet by mouth every 6 (six) hours as needed (pain). Patient taking differently: 1 tablet every 6 (six) hours as needed (pain).  01/12/17  Yes [name], MD  lidocaine (XYLOCAINE) 2 % solution Patient: Mix 1part 2% viscous lidocaine, 1part H20. Swish &/or swallow 66mL of diluted mixture, 51min before meals and at bedtime, up to QID 04/14/18  Yes [name], MD  lisinopril (PRINIVIL,ZESTRIL) 5 MG tablet Take 5 mg by mouth daily.  04/06/17  Yes [provider]  LORazepam (ATIVAN) 1 MG tablet Take 1 mg by mouth 4 (four) times daily as needed for anxiety. For anxiety 03/31/15  Yes [provider]  metFORMIN (GLUCOPHAGE-XR) 500 MG 24 hr tablet Take 1-2 tablets by mouth 3 (three) times daily. Takes 2 tablets in the morning, 1 tablet at lunch and 1 tablet at dinner 12/27/17  Yes [provider]  nitrofurantoin, macrocrystal-monohydrate, (MACROBID) 100 MG capsule Take 100 mg by mouth 2 (two) times daily. 11/03/18  Yes [provider]  pantoprazole (PROTONIX) 40 MG tablet Take 20 mg by mouth daily.    Yes [provider]  tamsulosin (FLOMAX) 0.4 MG CAPS capsule TAKE ONE CAPSULE BY MOUTH EVERY DAY FOR PROSTATE 05/19/17  Yes [provider]  testosterone cypionate (DEPOTESTOSTERONE CYPIONATE) 200 MG/ML injection Inject 150 mg into the  muscle every 14 (fourteen) days.  02/04/17  Yes [provider]  traZODone (DESYREL) 50 MG tablet Take 50-100 mg by mouth at bedtime.    Yes [provider]  Acetaminophen (TYLENOL 8 HOUR PO) 2 tablets by Feeding Tube route 2 (two) times daily as needed.     [provider]  atorvastatin (LIPITOR) 40 MG tablet Take 40 mg by mouth daily at 6 PM.  02/14/17   [provider]  B-D UF III MINI PEN NEEDLES 31G X 5 MM MISC  03/25/17   [provider]  B-D ULTRAFINE III SHORT PEN 31G X 8 MM MISC  03/27/17   [provider]  cephALEXin (KEFLEX) 500 MG capsule Take 1 capsule (500 mg total) by mouth 4 (four) times daily. 11/08/18   Fredia Sorrow, MD  ibuprofen (ADVIL,MOTRIN) 100 MG/5ML suspension Place 20 mLs (400 mg total) into feeding tube every 6 (six) hours as needed. Take with food. 05/30/17  [name], MD  loratadine (CLARITIN) 10 MG tablet Take 10 mg by mouth daily.    [provider]  senna (SENOKOT) 8.6 MG TABS tablet Take 1 tablet (8.6 mg total) by mouth at bedtime as needed for mild constipation. May be needed as pain medication can constipate. 01/12/17   [name], MD  sildenafil (REVATIO) 20 MG tablet TAKE UP TO FIVE TABLETS BY MOUTH ONE HOUR PRIOR TO INTERCOURSE 08/27/18   [provider]  Water For Irrigation, Sterile (FREE WATER) SOLN Place 100 mLs into feeding tube every 8 (eight) hours. 04/22/17   Orson Eva, MD    Family History Family History  Problem Relation Age of Onset  . Hypertension Mother   . Colon cancer Neg Hx     Social History Social History   Tobacco Use  . Smoking status: Former Smoker    Packs/day: 0.50    Years: 35.00    Pack years: 17.50  . Smokeless tobacco: Never Used  Substance Use Topics  . Alcohol use: No    Comment: quit 2000 but relapse in 2016. no etoh since hospitalized 2016.   . Drug use: No     Allergies   Patient has no known allergies.   Review of Systems Review of  Systems  Constitutional: Negative for chills and fever.  HENT: Negative for rhinorrhea and sore throat.   Eyes: Negative for visual disturbance.  Respiratory: Negative for cough and shortness of breath.   Cardiovascular: Negative for chest pain and leg swelling.  Gastrointestinal: Positive for abdominal distention and abdominal pain. Negative for diarrhea, nausea and vomiting.  Genitourinary: Positive for difficulty urinating. Negative for dysuria and hematuria.  Musculoskeletal: Negative for back pain and neck pain.  Skin: Negative for rash.  Neurological: Negative for dizziness, light-headedness and headaches.  Hematological: Does not bruise/bleed easily.  Psychiatric/Behavioral: Negative for confusion.     Physical Exam Updated Vital Signs BP (!) 146/84 (BP Location: Right Arm)   Pulse (!) 101   Temp 97.9 F (36.6 C) (Oral)   Resp 16   Ht 1.803 m (5\' 11" )   Wt 83.5 kg   SpO2 96%   BMI 25.66 kg/m   Physical Exam Vitals signs and nursing note reviewed.  Constitutional:      General: He is not in acute distress.    Appearance: Normal appearance. He is well-developed.  HENT:     Head: Normocephalic and atraumatic.     Mouth/Throat:     Mouth: Mucous membranes are moist.  Eyes:     Extraocular Movements: Extraocular movements intact.     Conjunctiva/sclera: Conjunctivae normal.     Pupils: Pupils are equal, round, and reactive to light.  Neck:     Musculoskeletal: Normal range of motion and neck supple.  Cardiovascular:     Rate and Rhythm: Normal rate and regular rhythm.     Heart sounds: No murmur.  Pulmonary:     Effort: Pulmonary effort is normal. No respiratory distress.     Breath sounds: Normal breath sounds.  Abdominal:     General: There is distension.     Palpations: Abdomen is soft.     Tenderness: There is no abdominal tenderness.     Comments: Bladder distended in the lower part of the abdomen some discomfort to palpation no guarding.   Musculoskeletal: Normal range of motion.  Skin:    General: Skin is warm and dry.  Neurological:     General: No focal deficit present.  Mental Status: He is alert and oriented to person, place, and time.      ED Treatments / Results  Labs (all labs ordered are listed, but only abnormal results are displayed) Labs Reviewed  URINALYSIS, ROUTINE W REFLEX MICROSCOPIC - Abnormal; Notable for the following components:      Result Value   APPearance HAZY (*)    Leukocytes,Ua MODERATE (*)    WBC, UA >50 (*)    Bacteria, UA RARE (*)    All other components within normal limits    EKG None  Radiology No results found.  Procedures Procedures (including critical care time)  Medications Ordered in ED Medications  cephALEXin (KEFLEX) capsule 500 mg (500 mg Oral Given 11/08/18 1915)     Initial Impression / Assessment and Plan / ED Course  I have reviewed the triage vital signs and the nursing notes.  Pertinent labs & imaging results that were available during my care of the patient were reviewed by me and considered in my medical decision making (see chart for details).       Patient's bladder scan showed about 500 cc of urine.  Foley catheter was placed got about 600 cc of urine out bladder distention went down.  Urinalysis suggestive of urinary tract infection sent for culture.  And patient started on Keflex first dose given here.  Patient nontoxic no acute distress feels much better with the Foley catheter in place.  He will be given a leg bag.  Since he Artie has follow-up with urology recommend that he keep that.  But it will be too soon probably to have the Foley removed.  Prescription given for Keflex to take for the next 7 days.  And as stated urine culture was sent.   Final Clinical Impressions(s) / ED Diagnoses   Final diagnoses:  Urinary retention  Acute cystitis without hematuria    ED Discharge Orders         Ordered    cephALEXin (KEFLEX) 500 MG capsule   4 times daily     11/08/18 1933           Fredia Sorrow, MD 11/08/18 1946

## 2018-11-09 ENCOUNTER — Ambulatory Visit (INDEPENDENT_AMBULATORY_CARE_PROVIDER_SITE_OTHER): Payer: Self-pay | Admitting: Otolaryngology

## 2019-01-26 ENCOUNTER — Other Ambulatory Visit: Payer: Self-pay | Admitting: Urology

## 2019-02-22 NOTE — Patient Instructions (Addendum)
YOU NEED TO HAVE A COVID 19 TEST ON_______ @_______ , THIS TEST MUST BE DONE BEFORE SURGERY, COME TO [place] ENTRANCE. ONCE YOUR COVID TEST IS COMPLETED, PLEASE BEGIN THE QUARANTINE INSTRUCTIONS AS OUTLINED IN YOUR HANDOUT.                Colin Wagner    Your procedure is scheduled on: 03-01-2019  Report to [facility] Main  Entrance              Report to admitting at 800 AM      Call this number if you have problems the morning of surgery [phone]    Remember: Do not eat food or drink liquids :After Midnight. BRUSH YOUR TEETH MORNING OF SURGERY AND RINSE YOUR MOUTH OUT, NO CHEWING GUM CANDY OR MINTS.     Take these medicines the morning of surgery with A SIP OF WATER: flomax, protonix, ativan if needed, flonase, zyrtec   DO NOT TAKE ANY DIABETIC MEDICATIONS DAY OF YOUR SURGERY      How to Manage Your Diabetes Before and After Surgery  Why is it important to control my blood sugar before and after surgery? . Improving blood sugar levels before and after surgery helps healing and can limit problems. . A way of improving blood sugar control is eating a healthy diet by: o  Eating less sugar and carbohydrates o  Increasing activity/exercise o  Talking with your doctor about reaching your blood sugar goals . High blood sugars (greater than 180 mg/dL) can raise your risk of infections and slow your recovery, so you will need to focus on controlling your diabetes during the weeks before surgery. . Make sure that the doctor who takes care of your diabetes knows about your planned surgery including the date and location.  How do I manage my blood sugar before surgery? . Check your blood sugar at least 4 times a day, starting 2 days before surgery, to make sure that the level is not too high or low. o Check your blood sugar the morning of your surgery when you wake up and every 2 hours until you get to the Short Stay unit. . If your blood  sugar is less than 70 mg/dL, you will need to treat for low blood sugar: o Do not take insulin. o Treat a low blood sugar (less than 70 mg/dL) with  cup of clear juice (cranberry or apple), 4 glucose tablets, OR glucose gel. o Recheck blood sugar in 15 minutes after treatment (to make sure it is greater than 70 mg/dL). If your blood sugar is not greater than 70 mg/dL on recheck, call [phone] for further instructions. . Report your blood sugar to the short stay nurse when you get to Short Stay.  . If you are admitted to the hospital after surgery: o Your blood sugar will be checked by the staff and you will probably be given insulin after surgery (instead of oral diabetes medicines) to make sure you have good blood sugar levels. o The goal for blood sugar control after surgery is 80-180 mg/dL.   WHAT DO I DO ABOUT MY DIABETES MEDICATION?  Marland Kitchen Do not take oral diabetes medicines (pills) the morning of surgery.         THE  DAY BEFORE SURGERY,  take    Morning dose of glipizide as usual  And metformin as usual . NONE day of surgery        Reviewed and  Endorsed by [facility] Patient Education Committee, August 2015                            You may not have any metal on your body including hair pins and              piercings  Do not wear jewelry, lotions, powders or perfumes, deodorant                         Men may shave face and neck.   Do not bring valuables to the hospital. [place].  Contacts, dentures or bridgework may not be worn into surgery.       Patients discharged the day of surgery will not be allowed to drive home. IF YOU ARE HAVING SURGERY AND GOING HOME THE SAME DAY, YOU MUST HAVE AN ADULT TO DRIVE YOU HOME AND BE WITH YOU FOR 24 HOURS. YOU MAY GO HOME BY TAXI OR UBER OR ORTHERWISE, BUT AN ADULT MUST ACCOMPANY YOU HOME AND STAY WITH YOU FOR 24 HOURS.  Name and phone number of your driver:  Special Instructions:  N/A              Please read over the following fact sheets you were given: _____________________________________________________________________             [facility] - Preparing for Surgery Before surgery, you can play an important role.  Because skin is not sterile, your skin needs to be as free of germs as possible.  You can reduce the number of germs on your skin by washing with CHG (chlorahexidine gluconate) soap before surgery.  CHG is an antiseptic cleaner which kills germs and bonds with the skin to continue killing germs even after washing. Please DO NOT use if you have an allergy to CHG or antibacterial soaps.  If your skin becomes reddened/irritated stop using the CHG and inform your nurse when you arrive at Short Stay. Do not shave (including legs and underarms) for at least 48 hours prior to the first CHG shower.  You may shave your face/neck. Please follow these instructions carefully:  1.  Shower with CHG Soap the night before surgery and the  morning of Surgery.  2.  If you choose to wash your hair, wash your hair first as usual with your  normal  shampoo.  3.  After you shampoo, rinse your hair and body thoroughly to remove the  shampoo.                           4.  Use CHG as you would any other liquid soap.  You can apply chg directly  to the skin and wash                       Gently with a scrungie or clean washcloth.  5.  Apply the CHG Soap to your body ONLY FROM THE NECK DOWN.   Do not use on face/ open                           Wound or open sores. Avoid contact with eyes, ears mouth and genitals (private parts).  Wash face,  Genitals (private parts) with your normal soap.             6.  Wash thoroughly, paying special attention to the area where your surgery  will be performed.  7.  Thoroughly rinse your body with warm water from the neck down.  8.  DO NOT shower/wash with your normal soap after using and rinsing off  the CHG Soap.                 9.  Pat yourself dry with a clean towel.            10.  Wear clean pajamas.            11.  Place clean sheets on your bed the night of your first shower and do not  sleep with pets. Day of Surgery : Do not apply any lotions/deodorants the morning of surgery.  Please wear clean clothes to the hospital/surgery center.  FAILURE TO FOLLOW THESE INSTRUCTIONS MAY RESULT IN THE CANCELLATION OF YOUR SURGERY PATIENT SIGNATURE_________________________________  NURSE SIGNATURE__________________________________  ________________________________________________________________________

## 2019-02-26 ENCOUNTER — Encounter (HOSPITAL_COMMUNITY)
Admission: RE | Admit: 2019-02-26 | Discharge: 2019-02-26 | Disposition: A | Discharge status: Home / Self Care | Payer: Medicare Other | Source: Ambulatory Visit | Attending: Urology | Admitting: Urology

## 2019-02-26 ENCOUNTER — Encounter (HOSPITAL_COMMUNITY): Payer: Self-pay

## 2019-02-26 ENCOUNTER — Other Ambulatory Visit (HOSPITAL_COMMUNITY)
Admission: RE | Admit: 2019-02-26 | Discharge: 2019-02-26 | Disposition: A | Discharge status: Home / Self Care | Payer: Medicare Other | Source: Ambulatory Visit | Attending: Urology | Admitting: Urology

## 2019-02-26 ENCOUNTER — Other Ambulatory Visit: Payer: Self-pay

## 2019-02-26 DIAGNOSIS — Z79890 Hormone replacement therapy (postmenopausal): Secondary | ICD-10-CM | POA: Diagnosis not present

## 2019-02-26 DIAGNOSIS — M549 Dorsalgia, unspecified: Secondary | ICD-10-CM | POA: Diagnosis not present

## 2019-02-26 DIAGNOSIS — Z7984 Long term (current) use of oral hypoglycemic drugs: Secondary | ICD-10-CM | POA: Diagnosis not present

## 2019-02-26 DIAGNOSIS — Z7982 Long term (current) use of aspirin: Secondary | ICD-10-CM | POA: Diagnosis not present

## 2019-02-26 DIAGNOSIS — E119 Type 2 diabetes mellitus without complications: Secondary | ICD-10-CM | POA: Diagnosis not present

## 2019-02-26 DIAGNOSIS — Z955 Presence of coronary angioplasty implant and graft: Secondary | ICD-10-CM | POA: Diagnosis not present

## 2019-02-26 DIAGNOSIS — E785 Hyperlipidemia, unspecified: Secondary | ICD-10-CM | POA: Diagnosis not present

## 2019-02-26 DIAGNOSIS — G8929 Other chronic pain: Secondary | ICD-10-CM | POA: Diagnosis not present

## 2019-02-26 DIAGNOSIS — C049 Malignant neoplasm of floor of mouth, unspecified: Secondary | ICD-10-CM | POA: Diagnosis not present

## 2019-02-26 DIAGNOSIS — J449 Chronic obstructive pulmonary disease, unspecified: Secondary | ICD-10-CM | POA: Diagnosis not present

## 2019-02-26 DIAGNOSIS — N401 Enlarged prostate with lower urinary tract symptoms: Secondary | ICD-10-CM | POA: Diagnosis not present

## 2019-02-26 DIAGNOSIS — I10 Essential (primary) hypertension: Secondary | ICD-10-CM | POA: Diagnosis not present

## 2019-02-26 DIAGNOSIS — Z85528 Personal history of other malignant neoplasm of kidney: Secondary | ICD-10-CM | POA: Diagnosis not present

## 2019-02-26 DIAGNOSIS — Z7902 Long term (current) use of antithrombotics/antiplatelets: Secondary | ICD-10-CM | POA: Diagnosis not present

## 2019-02-26 DIAGNOSIS — Z1159 Encounter for screening for other viral diseases: Secondary | ICD-10-CM | POA: Diagnosis not present

## 2019-02-26 DIAGNOSIS — I2510 Atherosclerotic heart disease of native coronary artery without angina pectoris: Secondary | ICD-10-CM | POA: Diagnosis not present

## 2019-02-26 DIAGNOSIS — Z905 Acquired absence of kidney: Secondary | ICD-10-CM | POA: Diagnosis not present

## 2019-02-26 DIAGNOSIS — N138 Other obstructive and reflux uropathy: Secondary | ICD-10-CM | POA: Diagnosis not present

## 2019-02-26 DIAGNOSIS — Z79899 Other long term (current) drug therapy: Secondary | ICD-10-CM | POA: Diagnosis not present

## 2019-02-26 DIAGNOSIS — E7800 Pure hypercholesterolemia, unspecified: Secondary | ICD-10-CM | POA: Diagnosis not present

## 2019-02-26 DIAGNOSIS — K219 Gastro-esophageal reflux disease without esophagitis: Secondary | ICD-10-CM | POA: Diagnosis not present

## 2019-02-26 DIAGNOSIS — Z87891 Personal history of nicotine dependence: Secondary | ICD-10-CM | POA: Diagnosis not present

## 2019-02-26 DIAGNOSIS — R338 Other retention of urine: Secondary | ICD-10-CM | POA: Diagnosis not present

## 2019-02-26 HISTORY — DX: Pneumonia, unspecified organism: J18.9

## 2019-02-26 HISTORY — DX: Malignant neoplasm of head, face and neck: C76.0

## 2019-02-26 LAB — BASIC METABOLIC PANEL
Anion gap: 10 (ref 5–15)
BUN: 52 mg/dL — ABNORMAL HIGH (ref 8–23)
CO2: 25 mmol/L (ref 22–32)
Calcium: 10 mg/dL (ref 8.9–10.3)
Chloride: 103 mmol/L (ref 98–111)
Creatinine, Ser: 1.15 mg/dL (ref 0.61–1.24)
GFR calc Af Amer: >60 mL/min (ref >60)
GFR calc non Af Amer: >60 mL/min (ref >60)
Glucose, Bld: 162 mg/dL — ABNORMAL HIGH (ref 70–99)
Potassium: 5.1 mmol/L (ref 3.5–5.1)
Sodium: 138 mmol/L (ref 135–145)

## 2019-02-26 LAB — CBC
HCT: 36.3 % — ABNORMAL LOW (ref 39.0–52.0)
Hemoglobin: 11.3 g/dL — ABNORMAL LOW (ref 13.0–17.0)
MCH: 27.9 pg (ref 26.0–34.0)
MCHC: 31.1 g/dL (ref 30.0–36.0)
MCV: 89.6 fL (ref 80.0–100.0)
Platelets: 205 10*3/uL (ref 150–400)
RBC: 4.05 MIL/uL — ABNORMAL LOW (ref 4.22–5.81)
RDW: 15.1 % (ref 11.5–15.5)
WBC: 5.8 10*3/uL (ref 4.0–10.5)
nRBC: 0 % (ref 0.0–0.2)

## 2019-02-26 LAB — GLUCOSE, CAPILLARY: Glucose-Capillary: 150 mg/dL — ABNORMAL HIGH (ref 70–99)

## 2019-02-26 LAB — HEMOGLOBIN A1C
Hgb A1c MFr Bld: 8.2 % — ABNORMAL HIGH (ref 4.8–5.6)
Mean Plasma Glucose: 188.64 mg/dL

## 2019-02-26 NOTE — Progress Notes (Addendum)
[facility] Dr. Nicola Girt 06-30-18 care everywhere  CT neck 10-06-18 epic  CT neck and thyroid  1-7 20 care everywhere

## 2019-02-27 LAB — SARS CORONAVIRUS 2 (TAT 6-24 HRS): SARS Coronavirus 2: NEGATIVE

## 2019-02-27 NOTE — Anesthesia Preprocedure Evaluation (Addendum)
Anesthesia Evaluation  Patient identified by MRN, date of birth, ID band Patient awake    Reviewed: Allergy & Precautions, NPO status , Patient's Chart, lab work & pertinent test results  History of Anesthesia Complications (+) DIFFICULT AIRWAY and history of anesthetic complications  Airway Mallampati: IV  TM Distance: >3 FB Neck ROM: Full  Mouth opening: Limited Mouth Opening  Dental  (+) Chipped,    Pulmonary COPD, former smoker,  left mandible resected, s/p radiation 2018   Pulmonary exam normal breath sounds clear to auscultation       Cardiovascular hypertension, Pt. on medications Normal cardiovascular exam Rhythm:Regular Rate:Normal  ECG: NSR, rate 95   Neuro/Psych  Headaches, PSYCHIATRIC DISORDERS    GI/Hepatic PUD, GERD  Medicated and Controlled,(+)     substance abuse  ,   Endo/Other  diabetes, Oral Hypoglycemic Agents  Renal/GU negative Renal ROS     Musculoskeletal Chronic back pain   Abdominal   Peds  Hematology  (+) anemia , HLD   Anesthesia Other Findings URINARY RETENTION  Reproductive/Obstetrics                          Anesthesia Physical Anesthesia Plan  ASA: III  Anesthesia Plan: General   Post-op Pain Management:    Induction: Intravenous  PONV Risk Score and Plan: 3 and Ondansetron, Dexamethasone and Treatment may vary due to age or medical condition  Airway Management Planned: Oral ETT and Awake Intubation Planned  Additional Equipment:   Intra-op Plan:   Post-operative Plan: Extubation in OR  Informed Consent: I have reviewed the patients History and Physical, chart, labs and discussed the procedure including the risks, benefits and alternatives for the proposed anesthesia with the patient or authorized representative who has indicated his/her understanding and acceptance.     Dental advisory given  Plan Discussed with: CRNA  Anesthesia Plan  Comments: (Reviewed PAT note 02/26/2019, difficult airway in the past, Konrad Felix, PA-C)       Anesthesia Quick Evaluation

## 2019-02-27 NOTE — Progress Notes (Signed)
Anesthesia Chart Review   Case: [id number] Date/Time: 03/01/19 0945   Procedure: TRANSURETHRAL RESECTION OF THE PROSTATE (TURP)WITH CYSTOSCOPY (N/A )   Anesthesia type: General   Pre-op diagnosis: URINARY RETENTION   Location: [facility] ROOM 03 / [facility]   Surgeon: Raynelle Bring, MD      DISCUSSION:68 y.o. 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 Raynelle Bring.    Anesthesia records reviewed, pt noted to have difficulty airway.  S/p right carotid stent placement 05/31/18.  Per note by Dr. Jannifer Rodney, "PreO2. IV induc. Reasonable mask with oral airway, 2 hands. Paralytic given. Ventialted with volatile. Dr. Awilda Bill and I attempted asleep FOI but due to secretions, abnl anatomy did not get good view. Also looked with c-mac without good view. Spo2 100%. Sugammadex given. Woke Mr. Brackens up and explained that we will proceed with awake FOI with topicalization. Mr. Fitzwater voiced understanding and cooperated well with topicalization and intubation by Dr. Awilda Bill. Of note awake FOI was much easier than asleep and I recommend this approach in the future"  With mandibulectomy, node resection, palate lesion resection 02/25/2017 pt transferred to ICU.    Pt s/p right carotid stent 10/19, followed by Dr. Senaida Ores.  Clearance received.  Per Ledell Peoples, PA-C 01/26/2019, "Patient is over 6 months post op and should be ok to hold (Plavix) for procedure- restart ASAP after surgery."  Anticipate pt can proceed with planned procedure barring acute status change.   VS: BP 139/72 (BP Location: Right Arm)   Pulse 96   Temp 37.1 C (Oral)   Resp 18   Ht _0  (1.803 m)   Wt 81.8 kg   SpO2 96%   BMI 25.14 kg/m   PROVIDERS: [name], MD  [name], MD is radiation  oncologist, last seen 09/08/2018, stable   Raynelle Jan, MD is Vascular Surgeon LABS: Labs reviewed: Acceptable for surgery. (all labs ordered are listed, but only abnormal results are displayed)  Labs Reviewed  BASIC METABOLIC PANEL - Abnormal; Notable for the following components:      Result Value   Glucose, Bld 162 (*)    BUN 52 (*)    All other components within normal limits  CBC - Abnormal; Notable for the following components:   RBC 4.05 (*)    Hemoglobin 11.3 (*)    HCT 36.3 (*)    All other components within normal limits  HEMOGLOBIN A1C - Abnormal; Notable for the following components:   Hgb A1c MFr Bld 8.2 (*)    All other components within normal limits  GLUCOSE, CAPILLARY - Abnormal; Notable for the following components:   Glucose-Capillary 150 (*)    All other components within normal limits     IMAGES:   EKG: 02/26/2019 Rate 95 bpm Normal sinus rhythm  Nonspecific T wave abnormalisty  CV:  Past Medical History:  Diagnosis Date  . Alcohol abuse   . Alcoholic pancreatitis 3559   admission  . Chronic back pain   . Chronic pancreatitis ([place])    based on ct findings 2016  . COPD (chronic obstructive pulmonary disease) ([place])   . Diabetes mellitus    type 2  . Diverticulosis   . Gastritis   . GERD (gastroesophageal reflux disease)   . Headache   . History  of radiation therapy 05/12/17- 06/22/17   Left cheek and bilateral neck/ 60 Gy in 30 fractions to gross disease  . Hyperlipidemia   . Hypertension   . 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 Peptic ulcer disease 1999   Per medical reports, no H pylori  . Pneumonia   . Renal cancer, left ([place]) 2012   he tells me that he has been released, ?and that he is free of cancer, and never had it to begin with.   . Right shoulder pain   . Testicular hypofunction     Past Surgical History:  Procedure Laterality Date  . COLONOSCOPY  2003   Dr. [name], polyps  . COLONOSCOPY  2005    Dr. [name], multiple diverticula  . COLONOSCOPY  2008   Dr. [name], diverticulosis  . COLONOSCOPY WITH PROPOFOL N/A 10/21/2016   Dr. [name]: Diverticulosis, two 5-7 mm polyps removed. path-tubular adenomas.  Next colonoscopy in 5 years.  . ESOPHAGOGASTRODUODENOSCOPY     Multiple EGDs. 1999 EGD showed gastric ulcers, no H pylori and benign biopsies performed by Dr. [name]. 2001 gastric ulcer healed. Last EGD 2005 had gastritis.  . IR REPLC GASTRO/COLONIC TUBE PERCUT W/FLUORO  05/16/2018  . Left partial mandibulectomy, Scapular free flap reconstruction, selective neck dissection, tracheotomy, and resection of intraoral palate cancer. Left 03/01/2017   [facility]  . LUNG BIOPSY    . PARTIAL NEPHRECTOMY Left 2012  . POLYPECTOMY  10/21/2016   Procedure: POLYPECTOMY;  Surgeon: [name], MD;  Location: [facility];  Service: Endoscopy;;  hepatic flexure x2    MEDICATIONS: . Nutritional Supplements (GLUCERNA 1.5 CAL PO)  . aspirin EC 81 MG tablet  . atorvastatin (LIPITOR) 40 MG tablet  . cetirizine (ZYRTEC) 10 MG tablet  . clopidogrel (PLAVIX) 75 MG tablet  . cyclobenzaprine (FLEXERIL) 10 MG tablet  . fluticasone (FLONASE) 50 MCG/ACT nasal spray  . glipiZIDE (GLUCOTROL XL) 2.5 MG 24 hr tablet  . HYDROcodone-acetaminophen (NORCO) 7.5-325 MG tablet  . lidocaine (XYLOCAINE) 2 % solution  . lisinopril (PRINIVIL,ZESTRIL) 5 MG tablet  . LORazepam (ATIVAN) 1 MG tablet  . metFORMIN (GLUCOPHAGE-XR) 500 MG 24 hr tablet  . pantoprazole (PROTONIX) 40 MG tablet  . senna (SENOKOT) 8.6 MG TABS tablet  . tamsulosin (FLOMAX) 0.4 MG CAPS capsule  . traZODone (DESYREL) 50 MG tablet  . Water For Irrigation, Sterile (FREE WATER) SOLN   No current facility-administered medications for this encounter.    Maia Plan [facility] Pre-Surgical Testing [phone] 02/27/19  11:06 AM

## 2019-02-28 MED ORDER — GENTAMICIN SULFATE 40 MG/ML IJ SOLN
5.0000 mg/kg | Freq: Once | INTRAVENOUS | Status: AC
  Administered 2019-03-01: 390 mg via INTRAVENOUS
  Filled 2019-02-28: qty 9.75
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
## 2019-03-01 ENCOUNTER — Other Ambulatory Visit: Payer: Self-pay

## 2019-03-01 ENCOUNTER — Observation Stay (HOSPITAL_COMMUNITY)
Admission: RE | Admit: 2019-03-01 | Discharge: 2019-03-02 | Disposition: A | Discharge status: Home / Self Care | Payer: Medicare Other | Source: Ambulatory Visit | Attending: Urology | Admitting: Urology

## 2019-03-01 ENCOUNTER — Ambulatory Visit (HOSPITAL_COMMUNITY): Payer: Medicare Other | Admitting: Physician Assistant

## 2019-03-01 ENCOUNTER — Encounter (HOSPITAL_COMMUNITY): Payer: Self-pay | Admitting: *Deleted

## 2019-03-01 ENCOUNTER — Encounter (HOSPITAL_COMMUNITY)
Admission: RE | Disposition: A | Discharge status: Home / Self Care | Payer: Self-pay | Source: Ambulatory Visit | Attending: Urology

## 2019-03-01 ENCOUNTER — Ambulatory Visit (HOSPITAL_COMMUNITY): Payer: Medicare Other | Admitting: Anesthesiology

## 2019-03-01 DIAGNOSIS — Z7984 Long term (current) use of oral hypoglycemic drugs: Secondary | ICD-10-CM | POA: Insufficient documentation

## 2019-03-01 DIAGNOSIS — M549 Dorsalgia, unspecified: Secondary | ICD-10-CM | POA: Insufficient documentation

## 2019-03-01 DIAGNOSIS — I2510 Atherosclerotic heart disease of native coronary artery without angina pectoris: Secondary | ICD-10-CM | POA: Diagnosis not present

## 2019-03-01 DIAGNOSIS — R338 Other retention of urine: Secondary | ICD-10-CM | POA: Insufficient documentation

## 2019-03-01 DIAGNOSIS — Z905 Acquired absence of kidney: Secondary | ICD-10-CM | POA: Insufficient documentation

## 2019-03-01 DIAGNOSIS — Z955 Presence of coronary angioplasty implant and graft: Secondary | ICD-10-CM | POA: Insufficient documentation

## 2019-03-01 DIAGNOSIS — Z79890 Hormone replacement therapy (postmenopausal): Secondary | ICD-10-CM | POA: Insufficient documentation

## 2019-03-01 DIAGNOSIS — K219 Gastro-esophageal reflux disease without esophagitis: Secondary | ICD-10-CM | POA: Insufficient documentation

## 2019-03-01 DIAGNOSIS — N138 Other obstructive and reflux uropathy: Secondary | ICD-10-CM | POA: Insufficient documentation

## 2019-03-01 DIAGNOSIS — C049 Malignant neoplasm of floor of mouth, unspecified: Secondary | ICD-10-CM | POA: Insufficient documentation

## 2019-03-01 DIAGNOSIS — E119 Type 2 diabetes mellitus without complications: Secondary | ICD-10-CM | POA: Insufficient documentation

## 2019-03-01 DIAGNOSIS — G8929 Other chronic pain: Secondary | ICD-10-CM | POA: Insufficient documentation

## 2019-03-01 DIAGNOSIS — N401 Enlarged prostate with lower urinary tract symptoms: Principal | ICD-10-CM | POA: Insufficient documentation

## 2019-03-01 DIAGNOSIS — I10 Essential (primary) hypertension: Secondary | ICD-10-CM | POA: Insufficient documentation

## 2019-03-01 DIAGNOSIS — Z1159 Encounter for screening for other viral diseases: Secondary | ICD-10-CM | POA: Insufficient documentation

## 2019-03-01 DIAGNOSIS — J449 Chronic obstructive pulmonary disease, unspecified: Secondary | ICD-10-CM | POA: Insufficient documentation

## 2019-03-01 DIAGNOSIS — Z79899 Other long term (current) drug therapy: Secondary | ICD-10-CM | POA: Insufficient documentation

## 2019-03-01 DIAGNOSIS — E785 Hyperlipidemia, unspecified: Secondary | ICD-10-CM | POA: Insufficient documentation

## 2019-03-01 DIAGNOSIS — Z85528 Personal history of other malignant neoplasm of kidney: Secondary | ICD-10-CM | POA: Insufficient documentation

## 2019-03-01 DIAGNOSIS — E7800 Pure hypercholesterolemia, unspecified: Secondary | ICD-10-CM | POA: Insufficient documentation

## 2019-03-01 DIAGNOSIS — Z87891 Personal history of nicotine dependence: Secondary | ICD-10-CM | POA: Insufficient documentation

## 2019-03-01 DIAGNOSIS — Z7902 Long term (current) use of antithrombotics/antiplatelets: Secondary | ICD-10-CM | POA: Insufficient documentation

## 2019-03-01 DIAGNOSIS — Z7982 Long term (current) use of aspirin: Secondary | ICD-10-CM | POA: Insufficient documentation

## 2019-03-01 HISTORY — PX: TRANSURETHRAL RESECTION OF PROSTATE: SHX73

## 2019-03-01 LAB — GLUCOSE, CAPILLARY
Glucose-Capillary: 184 mg/dL — ABNORMAL HIGH (ref 70–99)
Glucose-Capillary: 174 mg/dL — ABNORMAL HIGH (ref 70–99)
Glucose-Capillary: 196 mg/dL — ABNORMAL HIGH (ref 70–99)
Glucose-Capillary: 283 mg/dL — ABNORMAL HIGH (ref 70–99)
Glucose-Capillary: 295 mg/dL — ABNORMAL HIGH (ref 70–99)

## 2019-03-01 SURGERY — TURP (TRANSURETHRAL RESECTION OF PROSTATE)
Anesthesia: General

## 2019-03-01 MED ORDER — PANTOPRAZOLE SODIUM 40 MG PO TBEC
40.0000 mg | DELAYED_RELEASE_TABLET | Freq: Every day | ORAL | Status: DC
  Administered 2019-03-01 – 2019-03-02 (×2): 40 mg via ORAL
  Filled 2019-03-01 (×2): qty 1

## 2019-03-01 MED ORDER — SUCCINYLCHOLINE CHLORIDE 200 MG/10ML IV SOSY
PREFILLED_SYRINGE | INTRAVENOUS | Status: AC
  Filled 2019-03-01: qty 10

## 2019-03-01 MED ORDER — VANCOMYCIN HCL IN DEXTROSE 1-5 GM/200ML-% IV SOLN
1000.0000 mg | Freq: Two times a day (BID) | INTRAVENOUS | Status: AC
  Administered 2019-03-01: 1000 mg via INTRAVENOUS
  Filled 2019-03-01: qty 200

## 2019-03-01 MED ORDER — PROPOFOL 10 MG/ML IV BOLUS
INTRAVENOUS | Status: AC
  Filled 2019-03-01: qty 20

## 2019-03-01 MED ORDER — SUCCINYLCHOLINE CHLORIDE 20 MG/ML IJ SOLN
INTRAMUSCULAR | Status: DC | PRN
  Administered 2019-03-01: 80 mg via INTRAVENOUS

## 2019-03-01 MED ORDER — GLUCERNA 1.5 CAL PO LIQD
480.0000 mL | Freq: Three times a day (TID) | ORAL | Status: DC
  Administered 2019-03-01 – 2019-03-02 (×2): 480 mL
  Filled 2019-03-01: qty 711
  Filled 2019-03-01 (×4): qty 1000

## 2019-03-01 MED ORDER — FREE WATER
100.0000 mL | Freq: Three times a day (TID) | Status: DC
  Administered 2019-03-01: 100 mL

## 2019-03-01 MED ORDER — ACETAMINOPHEN 10 MG/ML IV SOLN
1000.0000 mg | Freq: Once | INTRAVENOUS | Status: AC
  Administered 2019-03-01: 11:00:00 1000 mg via INTRAVENOUS

## 2019-03-01 MED ORDER — FENTANYL CITRATE (PF) 100 MCG/2ML IJ SOLN
INTRAMUSCULAR | Status: AC
  Filled 2019-03-01: qty 4

## 2019-03-01 MED ORDER — LIDOCAINE VISCOUS HCL 2 % MT SOLN
5.0000 mL | Freq: Three times a day (TID) | OROMUCOSAL | Status: DC | PRN
  Administered 2019-03-02: 5 mL via OROMUCOSAL
  Filled 2019-03-01: qty 15

## 2019-03-01 MED ORDER — CYCLOBENZAPRINE HCL 10 MG PO TABS
10.0000 mg | ORAL_TABLET | Freq: Two times a day (BID) | ORAL | Status: DC
  Administered 2019-03-01: 10 mg via ORAL
  Filled 2019-03-01: qty 1

## 2019-03-01 MED ORDER — FLUTICASONE PROPIONATE 50 MCG/ACT NA SUSP
2.0000 | Freq: Every day | NASAL | Status: DC
  Filled 2019-03-01: qty 16

## 2019-03-01 MED ORDER — LORAZEPAM 1 MG PO TABS
1.0000 mg | ORAL_TABLET | Freq: Four times a day (QID) | ORAL | Status: DC | PRN
  Administered 2019-03-02: 1 mg via ORAL
  Filled 2019-03-01: qty 1

## 2019-03-01 MED ORDER — MIDAZOLAM HCL 2 MG/2ML IJ SOLN
INTRAMUSCULAR | Status: AC
  Filled 2019-03-01: qty 2

## 2019-03-01 MED ORDER — ZOLPIDEM TARTRATE 5 MG PO TABS: 5.0000 mg | ORAL_TABLET | Freq: Every evening | ORAL | Status: DC | PRN

## 2019-03-01 MED ORDER — TRAZODONE HCL 100 MG PO TABS
100.0000 mg | ORAL_TABLET | Freq: Every day | ORAL | Status: DC
  Administered 2019-03-01: 100 mg via ORAL
  Filled 2019-03-01: qty 1

## 2019-03-01 MED ORDER — FENTANYL CITRATE (PF) 100 MCG/2ML IJ SOLN
INTRAMUSCULAR | Status: DC | PRN
  Administered 2019-03-01 (×3): 50 ug via INTRAVENOUS

## 2019-03-01 MED ORDER — SODIUM CHLORIDE 0.9 % IV SOLN: 250.0000 mL | INTRAVENOUS | Status: DC | PRN

## 2019-03-01 MED ORDER — PHENYLEPHRINE 40 MCG/ML (10ML) SYRINGE FOR IV PUSH (FOR BLOOD PRESSURE SUPPORT)
PREFILLED_SYRINGE | INTRAVENOUS | Status: AC
  Filled 2019-03-01: qty 10

## 2019-03-01 MED ORDER — ACETAMINOPHEN 10 MG/ML IV SOLN
INTRAVENOUS | Status: AC
  Filled 2019-03-01: qty 100

## 2019-03-01 MED ORDER — FENTANYL CITRATE (PF) 100 MCG/2ML IJ SOLN
INTRAMUSCULAR | Status: AC
  Filled 2019-03-01: qty 2

## 2019-03-01 MED ORDER — SODIUM CHLORIDE 0.45 % IV SOLN
INTRAVENOUS | Status: DC
  Administered 2019-03-01 – 2019-03-02 (×2): via INTRAVENOUS

## 2019-03-01 MED ORDER — VANCOMYCIN HCL IN DEXTROSE 1-5 GM/200ML-% IV SOLN
INTRAVENOUS | Status: AC
  Administered 2019-03-01: 1000 mg via INTRAVENOUS
  Filled 2019-03-01: qty 200

## 2019-03-01 MED ORDER — LISINOPRIL 5 MG PO TABS
5.0000 mg | ORAL_TABLET | Freq: Every day | ORAL | Status: DC
  Administered 2019-03-01 – 2019-03-02 (×2): 5 mg via ORAL
  Filled 2019-03-01 (×2): qty 1

## 2019-03-01 MED ORDER — LIDOCAINE 2% (20 MG/ML) 5 ML SYRINGE
INTRAMUSCULAR | Status: AC
  Filled 2019-03-01: qty 10

## 2019-03-01 MED ORDER — LACTATED RINGERS IV SOLN
INTRAVENOUS | Status: DC
  Administered 2019-03-01 (×2): via INTRAVENOUS

## 2019-03-01 MED ORDER — SODIUM CHLORIDE 0.9 % IR SOLN
Status: DC | PRN
  Administered 2019-03-01: 15000 mL via INTRAVESICAL

## 2019-03-01 MED ORDER — DIPHENHYDRAMINE HCL 50 MG/ML IJ SOLN: 12.5000 mg | Freq: Four times a day (QID) | INTRAMUSCULAR | Status: DC | PRN

## 2019-03-01 MED ORDER — BELLADONNA ALKALOIDS-OPIUM 16.2-60 MG RE SUPP: 1.0000 | Freq: Four times a day (QID) | RECTAL | Status: DC | PRN

## 2019-03-01 MED ORDER — SENNA 8.6 MG PO TABS: 1.0000 | ORAL_TABLET | Freq: Every evening | ORAL | Status: DC | PRN

## 2019-03-01 MED ORDER — PHENYLEPHRINE HCL (PRESSORS) 10 MG/ML IV SOLN
INTRAVENOUS | Status: DC | PRN
  Administered 2019-03-01 (×3): 80 ug via INTRAVENOUS

## 2019-03-01 MED ORDER — FENTANYL CITRATE (PF) 100 MCG/2ML IJ SOLN
25.0000 ug | INTRAMUSCULAR | Status: DC | PRN
  Administered 2019-03-01 (×3): 50 ug via INTRAVENOUS

## 2019-03-01 MED ORDER — LIDOCAINE HCL 4 % EX SOLN
CUTANEOUS | Status: AC
  Filled 2019-03-01: qty 50

## 2019-03-01 MED ORDER — GLYCOPYRROLATE 0.2 MG/ML IJ SOLN
INTRAMUSCULAR | Status: AC
  Administered 2019-03-01: 0.2 mg via INTRAVENOUS
  Filled 2019-03-01: qty 1

## 2019-03-01 MED ORDER — SODIUM CHLORIDE 0.9% FLUSH: 3.0000 mL | Freq: Two times a day (BID) | INTRAVENOUS | Status: DC

## 2019-03-01 MED ORDER — FREE WATER
200.0000 mL | Freq: Three times a day (TID) | Status: DC
  Administered 2019-03-01 – 2019-03-02 (×2): 200 mL

## 2019-03-01 MED ORDER — DEXAMETHASONE SODIUM PHOSPHATE 10 MG/ML IJ SOLN
INTRAMUSCULAR | Status: AC
  Filled 2019-03-01: qty 1

## 2019-03-01 MED ORDER — ASPIRIN EC 81 MG PO TBEC
81.0000 mg | DELAYED_RELEASE_TABLET | Freq: Every day | ORAL | Status: DC
  Administered 2019-03-02: 81 mg via ORAL
  Filled 2019-03-01: qty 1

## 2019-03-01 MED ORDER — DEXAMETHASONE SODIUM PHOSPHATE 10 MG/ML IJ SOLN
INTRAMUSCULAR | Status: DC | PRN
  Administered 2019-03-01: 10 mg via INTRAVENOUS

## 2019-03-01 MED ORDER — HYDROCODONE-ACETAMINOPHEN 5-325 MG PO TABS
1.0000 | ORAL_TABLET | ORAL | Status: DC | PRN
  Administered 2019-03-01 (×2): 2 via ORAL
  Filled 2019-03-01 (×2): qty 2

## 2019-03-01 MED ORDER — INSULIN ASPART 100 UNIT/ML ~~LOC~~ SOLN
0.0000 [IU] | SUBCUTANEOUS | Status: DC
  Administered 2019-03-01 (×2): 8 [IU] via SUBCUTANEOUS
  Administered 2019-03-02: 3 [IU] via SUBCUTANEOUS
  Administered 2019-03-02 (×2): 2 [IU] via SUBCUTANEOUS
  Administered 2019-03-02: 8 [IU] via SUBCUTANEOUS

## 2019-03-01 MED ORDER — LORATADINE 10 MG PO TABS
10.0000 mg | ORAL_TABLET | Freq: Every day | ORAL | Status: DC
  Administered 2019-03-02: 10 mg via ORAL
  Filled 2019-03-01: qty 1

## 2019-03-01 MED ORDER — DIPHENHYDRAMINE HCL 12.5 MG/5ML PO ELIX: 12.5000 mg | ORAL_SOLUTION | Freq: Four times a day (QID) | ORAL | Status: DC | PRN

## 2019-03-01 MED ORDER — GLYCOPYRROLATE 0.2 MG/ML IJ SOLN
0.2000 mg | Freq: Once | INTRAMUSCULAR | Status: AC
  Administered 2019-03-01: 0.2 mg via INTRAVENOUS

## 2019-03-01 MED ORDER — ONDANSETRON HCL 4 MG/2ML IJ SOLN: 4.0000 mg | INTRAMUSCULAR | Status: DC | PRN

## 2019-03-01 MED ORDER — GENTAMICIN SULFATE 40 MG/ML IJ SOLN
5.0000 mg/kg | Freq: Once | INTRAVENOUS | Status: AC
  Administered 2019-03-02: 390 mg via INTRAVENOUS
  Filled 2019-03-01: qty 9.75

## 2019-03-01 MED ORDER — SODIUM CHLORIDE (PF) 0.9 % IJ SOLN
INTRAMUSCULAR | Status: AC
  Filled 2019-03-01: qty 10

## 2019-03-01 MED ORDER — GLUCERNA 1.5 CAL PO LIQD
1000.0000 mL | Freq: Three times a day (TID) | ORAL | Status: DC
  Filled 2019-03-01 (×4): qty 1000

## 2019-03-01 MED ORDER — DOCUSATE SODIUM 100 MG PO CAPS
100.0000 mg | ORAL_CAPSULE | Freq: Two times a day (BID) | ORAL | Status: DC
  Administered 2019-03-01: 100 mg via ORAL
  Filled 2019-03-01 (×2): qty 1

## 2019-03-01 MED ORDER — ONDANSETRON HCL 4 MG/2ML IJ SOLN
INTRAMUSCULAR | Status: AC
  Filled 2019-03-01: qty 2

## 2019-03-01 MED ORDER — SODIUM CHLORIDE 0.9% FLUSH: 3.0000 mL | INTRAVENOUS | Status: DC | PRN

## 2019-03-01 MED ORDER — ACETAMINOPHEN 325 MG PO TABS: 650.0000 mg | ORAL_TABLET | ORAL | Status: DC | PRN

## 2019-03-01 MED ORDER — FREE WATER
120.0000 mL | Freq: Three times a day (TID) | Status: DC
  Administered 2019-03-01 – 2019-03-02 (×3): 120 mL

## 2019-03-01 MED ORDER — VANCOMYCIN HCL IN DEXTROSE 1-5 GM/200ML-% IV SOLN
1000.0000 mg | Freq: Once | INTRAVENOUS | Status: AC
  Administered 2019-03-01: 1000 mg via INTRAVENOUS

## 2019-03-01 MED ORDER — ONDANSETRON HCL 4 MG/2ML IJ SOLN: 4.0000 mg | Freq: Once | INTRAMUSCULAR | Status: DC | PRN

## 2019-03-01 MED ORDER — LIDOCAINE HCL (PF) 4 % IJ SOLN
INTRAMUSCULAR | Status: DC | PRN
  Administered 2019-03-01: 5 mL via RESPIRATORY_TRACT

## 2019-03-01 MED ORDER — SODIUM CHLORIDE 0.9 % IR SOLN
3000.0000 mL | Status: DC
  Administered 2019-03-02 (×2): 3000 mL

## 2019-03-01 MED ORDER — PHENAZOPYRIDINE HCL 200 MG PO TABS: 200.0000 mg | ORAL_TABLET | Freq: Three times a day (TID) | ORAL | 0 refills | Status: DC | PRN

## 2019-03-01 MED ORDER — ONDANSETRON HCL 4 MG/2ML IJ SOLN
INTRAMUSCULAR | Status: DC | PRN
  Administered 2019-03-01: 4 mg via INTRAVENOUS

## 2019-03-01 MED ORDER — ATORVASTATIN CALCIUM 40 MG PO TABS
40.0000 mg | ORAL_TABLET | Freq: Every day | ORAL | Status: DC
  Administered 2019-03-01: 40 mg via ORAL
  Filled 2019-03-01: qty 1

## 2019-03-01 MED ORDER — PROPOFOL 10 MG/ML IV BOLUS
INTRAVENOUS | Status: DC | PRN
  Administered 2019-03-01: 200 mg via INTRAVENOUS

## 2019-03-01 SURGICAL SUPPLY — 18 items
BAG URINE DRAINAGE (UROLOGICAL SUPPLIES) ×3 IMPLANT
BAG URO CATCHER STRL LF (MISCELLANEOUS) ×3 IMPLANT
CATH HEMA 3WAY 30CC 22FR COUDE (CATHETERS) ×2 IMPLANT
COVER WAND RF STERILE (DRAPES) IMPLANT
ELECT REM PT RETURN 15FT ADLT (MISCELLANEOUS) ×3 IMPLANT
GLOVE BIOGEL M STRL SZ7.5 (GLOVE) ×7 IMPLANT
GOWN STRL REUS W/TWL LRG LVL3 (GOWN DISPOSABLE) ×5 IMPLANT
HOLDER FOLEY CATH W/STRAP (MISCELLANEOUS) IMPLANT
KIT TURNOVER KIT A (KITS) ×2 IMPLANT
LOOP CUT BIPOLAR 24F LRG (ELECTROSURGICAL) ×2 IMPLANT
MANIFOLD NEPTUNE II (INSTRUMENTS) ×3 IMPLANT
PACK CYSTO (CUSTOM PROCEDURE TRAY) ×3 IMPLANT
SET ASPIRATION TUBING (TUBING) IMPLANT
SYR 30ML LL (SYRINGE) ×3 IMPLANT
SYRINGE IRR TOOMEY STRL 70CC (SYRINGE) ×3 IMPLANT
TUBING CONNECTING 10 (TUBING) ×2 IMPLANT
TUBING CONNECTING 10' (TUBING) ×1
TUBING UROLOGY SET (TUBING) ×3 IMPLANT

## 2019-03-01 NOTE — Anesthesia Procedure Notes (Signed)
Procedure Name: Intubation Date/Time: 03/01/2019 10:50 AM Performed by: Murvin Natal, MD Pre-anesthesia Checklist: Patient identified, Emergency Drugs available, Suction available and Patient being monitored Patient Re-evaluated:Patient Re-evaluated prior to induction Oxygen Delivery Method: Circle system utilized Preoxygenation: Pre-oxygenation with 100% oxygen Induction Type: IV induction, Inhalational induction and Combination inhalational/ intravenous induction Ventilation: Two handed mask ventilation required and Oral airway inserted - appropriate to patient size Grade View: Grade I Tube type: Oral Tube size: 7.0 mm Number of attempts: 3 Airway Equipment and Method: Fiberoptic brochoscope and Oral airway Placement Confirmation: ETT inserted through vocal cords under direct vision,  positive ETCO2 and breath sounds checked- equal and bilateral Secured at: 24 cm Tube secured with: Tape Difficulty Due To: Difficulty was anticipated and Difficult Airway-  due to edematous airway Future Recommendations: Recommend- awake intubation Comments: See LDA note and quick note

## 2019-03-01 NOTE — Progress Notes (Signed)
Pharmacy Antibiotic Note  Colin Wagner is a 68 y.o. male admitted on 03/01/2019 with BOO secondary to BPH. Underwent TURP with pre-op Gentamycin 390mg  pre-operatively per MD.  Pharmacy has been consulted for Gentamycin dosing for surgical prophylaxis.  Plan: Gentamycin 390mg  x1 on 7/10, at 24 hr after pre-op dose.  Height: 5\' 11"  (180.3 cm) Weight: 180 lb 4 oz (81.8 kg) IBW/kg (Calculated) : 75.3  Temp (24hrs), Avg:97.7 F (36.5 C), Min:97.4 F (36.3 C), Max:98 F (36.7 C)  Recent Labs  Lab 02/26/19 1353  WBC 5.8  CREATININE 1.15    Estimated Creatinine Clearance: 65.5 mL/min (by C-G formula based on SCr of 1.15 mg/dL).    No Known Allergies  Antimicrobials this admission: 7/9 Gent  >> 7/10  Dose adjustments this admission:   Microbiology results: 7/9 MD note states UCx obtained in office  Thank you for allowing pharmacy to be a part of this patient's care.  Graylin Shiver L 03/01/2019 2:10 PM

## 2019-03-01 NOTE — Discharge Instructions (Addendum)
1. You may see some blood in the urine and may have some burning with urination for 48-72 hours. You also may notice that you have to urinate more frequently or urgently after your procedure which is normal.  2. You should call should you develop an inability urinate, fever > 101, persistent nausea and vomiting that prevents you from eating or drinking to stay hydrated.  3. If you have a catheter, you will be taught how to take care of the catheter by the nursing staff prior to discharge from the hospital.  You may periodically feel a strong urge to void with the catheter in place.  This is a bladder spasm and most often can occur when having a bowel movement or moving around. It is typically self-limited and usually will stop after a few minutes.  You may use some Vaseline or Neosporin around the tip of the catheter to reduce friction at the tip of the penis. You may also see some blood in the urine.  A very small amount of blood can make the urine look quite red.  As long as the catheter is draining well, there usually is not a problem.  However, if the catheter is not draining well and is bloody, you should call the office [phone]) to notify us.       4.   PLEASE PLAN TO RESTART PLAVIX NEXT Thursday IF URINE REMAINS CLEAR.  IF NOT, PLEASE CALL THE OFFICE TO GET ADVICE BEFORE STARTING.

## 2019-03-01 NOTE — Op Note (Signed)
Preoperative diagnosis: 1. Bladder outlet obstruction with urinary retention secondary to BPH  Postoperative diagnosis:  1. Bladder outlet obstruction secondary with urinary retention to BPH  Procedure:  1. Cystoscopy 2. Transurethral resection of the prostate  Surgeon: Pryor Curia. M.D.  Anesthesia: General  Complications: None  EBL: Minimal  Specimens: 1. Prostate chips  Disposition of specimens: Pathology  Indication: Colin Wagner is a patient with bladder outlet obstruction with urinary retention secondary to benign prostatic hyperplasia. After reviewing the management options for treatment, he elected to proceed with the above surgical procedure(s). We have discussed the potential benefits and risks of the procedure, side effects of the proposed treatment, the likelihood of the patient achieving the goals of the procedure, and any potential problems that might occur during the procedure or recuperation. Informed consent has been obtained.  Description of procedure:  The patient was taken to the operating room and general anesthesia was induced.  The patient was placed in the dorsal lithotomy position, prepped and draped in the usual sterile fashion, and preoperative antibiotics were administered. A preoperative time-out was performed.   Cystourethroscopy was performed.  The patient's urethra was examined and was normal.   The bladder was then systematically examined in its entirety. There was no evidence of any bladder tumors, stones, or other mucosal pathology.  The ureteral orifices were identified and marked so as to be avoided during the procedure.  The prostate adenoma was then resected utilizing loop cautery resection with the bipolar cutting loop.  The prostate adenoma from the bladder neck back to the verumontanum was resected beginning at the six o'clock position and then extended to include the right and left lobes of the prostate and anterior prostate.  Care was taken not to resect distal to the verumontanum.  Hemostasis was then achieved with the cautery and the bladder was emptied and reinspected with no significant bleeding noted at the end of the procedure.    A 3 way catheter was then placed into the bladder and placed on continuous bladder irrigation.  The patient appeared to tolerate the procedure well and without complications.  The patient was able to be awakened and transferred to the recovery unit in satisfactory condition.

## 2019-03-01 NOTE — Progress Notes (Signed)
Patient ID: Colin Wagner, male   DOB: December 11, 1950, 68 y.o.   MRN: [id number]  Post-op note  Subjective: The patient is doing well.  No complaints.  Objective: Vital signs in last 24 hours: Temp:  [97.4 F (36.3 C)-98 F (36.7 C)] 97.8 F (36.6 C) (07/09 1346) Pulse Rate:  [72-101] 80 (07/09 1346) Resp:  [13-20] 16 (07/09 1346) BP: (135-152)/(68-89) 152/79 (07/09 1346) SpO2:  [91 %-100 %] 94 % (07/09 1346) Weight:  [81.8 kg] 81.8 kg (07/09 0824)  Intake/Output from previous day: No intake/output data recorded. Intake/Output this shift: Total I/O In: 1300 [I.V.:1100; Other:200] Out: 1150 [Urine:1150]  Physical Exam:  General: Alert and oriented. Abdomen: Soft, Nondistended. GU: Urine clear on minimal CBI.  Lab Results: No results for input(s): HGB, HCT in the last 72 hours.  Assessment/Plan: POD#0   1) Continue to monitor, titrate CBI off overnight   Pryor Curia. MD   LOS: 0 days   Dutch Gray 03/01/2019, 4:08 PM

## 2019-03-01 NOTE — Anesthesia Postprocedure Evaluation (Signed)
Anesthesia Post Note  Patient: AMADU SCHLAGETER  Procedure(s) Performed: TRANSURETHRAL RESECTION OF THE PROSTATE (TURP)WITH CYSTOSCOPY (N/A )     Patient location during evaluation: PACU Anesthesia Type: General Level of consciousness: awake Pain management: pain level controlled Vital Signs Assessment: post-procedure vital signs reviewed and stable Respiratory status: spontaneous breathing, nonlabored ventilation, respiratory function stable and patient connected to nasal cannula oxygen Cardiovascular status: blood pressure returned to baseline and stable Postop Assessment: no apparent nausea or vomiting Anesthetic complications: no    Last Vitals:  Vitals:   03/01/19 1315 03/01/19 1346  BP: 136/71 (!) 152/79  Pulse: 77 80  Resp: 16 16  Temp: (!) 36.4 C 36.6 C  SpO2: 91% 94%    Last Pain:  Vitals:   03/01/19 1400  TempSrc:   PainSc: 8                  Ryan P Ellender

## 2019-03-01 NOTE — Transfer of Care (Signed)
Immediate Anesthesia Transfer of Care Note  Patient: Colin Wagner  Procedure(s) Performed: TRANSURETHRAL RESECTION OF THE PROSTATE (TURP)WITH CYSTOSCOPY (N/A )  Patient Location: PACU  Anesthesia Type:General  Level of Consciousness: awake, alert  and oriented  Airway & Oxygen Therapy: Patient Spontanous Breathing and Patient connected to face mask oxygen  Post-op Assessment: Report given to RN and Post -op Vital signs reviewed and stable  Post vital signs: Reviewed and stable  Last Vitals:  Vitals Value Taken Time  BP 135/70 03/01/19 1215  Temp    Pulse 77 03/01/19 1216  Resp 12 03/01/19 1216  SpO2 100 % 03/01/19 1216  Vitals shown include unvalidated device data.  Last Pain:  Vitals:   03/01/19 0802  TempSrc: Oral      Patients Stated Pain Goal: 3 (08/08/23 4695)  Complications: No apparent anesthesia complications

## 2019-03-02 ENCOUNTER — Encounter (HOSPITAL_COMMUNITY): Payer: Self-pay | Admitting: Urology

## 2019-03-02 DIAGNOSIS — N401 Enlarged prostate with lower urinary tract symptoms: Secondary | ICD-10-CM | POA: Diagnosis not present

## 2019-03-02 LAB — GLUCOSE, CAPILLARY
Glucose-Capillary: 128 mg/dL — ABNORMAL HIGH (ref 70–99)
Glucose-Capillary: 138 mg/dL — ABNORMAL HIGH (ref 70–99)
Glucose-Capillary: 185 mg/dL — ABNORMAL HIGH (ref 70–99)
Glucose-Capillary: 256 mg/dL — ABNORMAL HIGH (ref 70–99)

## 2019-03-02 MED ORDER — GLUCERNA 1.5 CAL PO LIQD
480.0000 mL | Freq: Three times a day (TID) | ORAL | Status: DC
  Filled 2019-03-02 (×2): qty 1000

## 2019-03-02 NOTE — Progress Notes (Signed)
Patient ID: CREIGHTON LONGLEY, male   DOB: Apr 01, 1951, 68 y.o.   MRN: [id number]  1 Day Post-Op Subjective: Pt doing well.  Had some clots last night and CBI turned back on.  Now turned back off this morning.  Objective: Vital signs in last 24 hours: Temp:  [97.4 F (36.3 C)-98.4 F (36.9 C)] 98.4 F (36.9 C) (07/10 0510) Pulse Rate:  [72-101] 85 (07/10 0510) Resp:  [13-20] 16 (07/10 0510) BP: (100-152)/(68-89) 116/74 (07/10 0510) SpO2:  [91 %-100 %] 95 % (07/10 0510) Weight:  [81.8 kg] 81.8 kg (07/09 0824)  Intake/Output from previous day: 07/09 0701 - 07/10 0700 In: 7962.7 [I.V.:1962.7] Out: 8375 [Urine:8375] Intake/Output this shift: Total I/O In: -  Out: 1100 [Urine:1100]  Physical Exam:  General: Alert and oriented GU: Urine light pink although CBI just turned off  Lab Results:   Studies/Results: No results found.  Assessment/Plan: 1) Urinary retention/BPH: Will monitor with CBI off for the next couple of hours and then plan to consider voiding trial at that time.  If still concerning, may d/c home with catheter due to patient on ASA.  He will need to start Plavix back in the future as well.     LOS: 0 days   Dutch Gray 03/02/2019, 7:40 AM

## 2019-03-02 NOTE — Discharge Summary (Signed)
Date of admission: 03/01/2019  Date of discharge: 03/02/2019  Admission diagnosis: Urinary retention due to BPH  Discharge diagnosis: Urinary retention  Secondary diagnoses: Diabetes, CAD, SCCA mouth  History and Physical: For full details, please see admission history and physical. Briefly, Colin Wagner is a 68 y.o. year old patient with urinary retention due to BPH.   Hospital Course: He underwent a TURP on 03/01/19.  He was maintained on CBI overnight and this was able to be titrated off on POD # 1.   He underwent a successful voiding trial and was discharged home.  Laboratory values: No results for input(s): HGB, HCT in the last 72 hours. No results for input(s): CREATININE in the last 72 hours.  Disposition: Home  Discharge instruction: The patient was instructed to be ambulatory but told to refrain from heavy lifting, strenuous activity, or driving.   Discharge medications:  Allergies as of 03/02/2019   No Known Allergies     Medication List    STOP taking these medications   clopidogrel 75 MG tablet Commonly known as: PLAVIX   tamsulosin 0.4 MG Caps capsule Commonly known as: FLOMAX     TAKE these medications   aspirin EC 81 MG tablet Take 81 mg by mouth daily.   atorvastatin 40 MG tablet Commonly known as: LIPITOR Take 40 mg by mouth daily at 6 PM.   cetirizine 10 MG tablet Commonly known as: ZYRTEC Take 10 mg by mouth daily.   cyclobenzaprine 10 MG tablet Commonly known as: FLEXERIL Take 10 mg by mouth 2 (two) times a day.   fluticasone 50 MCG/ACT nasal spray Commonly known as: FLONASE Place 2 sprays into both nostrils daily.   free water Soln Place 100 mLs into feeding tube every 8 (eight) hours.   glipiZIDE 2.5 MG 24 hr tablet Commonly known as: GLUCOTROL XL Take 2.5 mg by mouth every morning.   GLUCERNA 1.5 CAL PO 480 mLs by PEG Tube route 3 (three) times daily. 120 ml water before 260ml water after   HYDROcodone-acetaminophen 7.5-325 MG  tablet Commonly known as: NORCO Take 1 tablet by mouth every 6 (six) hours as needed (pain). What changed: reasons to take this   lidocaine 2 % solution Commonly known as: XYLOCAINE Patient: Mix 1part 2% viscous lidocaine, 1part H20. Swish &/or swallow 63mL of diluted mixture, 51min before meals and at bedtime, up to QID What changed:   how much to take  how to take this  when to take this  reasons to take this  additional instructions   lisinopril 5 MG tablet Commonly known as: ZESTRIL Take 5 mg by mouth daily.   LORazepam 1 MG tablet Commonly known as: ATIVAN Take 1 mg by mouth 4 (four) times daily as needed for anxiety.   metFORMIN 500 MG 24 hr tablet Commonly known as: GLUCOPHAGE-XR Take 500-1,000 mg by mouth See admin instructions. Take 1000 mg by mouth in the morning, take 500 mg by mouth in the afternoon and 500 mg by mouth in the evening   phenazopyridine 200 MG tablet Commonly known as: Pyridium Take 1 tablet (200 mg total) by mouth 3 (three) times daily as needed for pain (burning with urination).   Protonix 40 MG tablet Generic drug: pantoprazole Take 40 mg by mouth daily.   senna 8.6 MG Tabs tablet Commonly known as: SENOKOT Take 1 tablet (8.6 mg total) by mouth at bedtime as needed for mild constipation. May be needed as pain medication can constipate.   traZODone  50 MG tablet Commonly known as: DESYREL Take 100 mg by mouth at bedtime.       He will restart Plavix next week as instructed if his urine remains clear.  Followup:  Follow-up Information    Raynelle Bring, MD.   Specialty: Urology Why: 03/21/19 at 9:00 AM Contact information: [place] [address] [phone]

## 2019-03-20 ENCOUNTER — Other Ambulatory Visit (HOSPITAL_COMMUNITY): Payer: Self-pay | Admitting: Interventional Radiology

## 2019-03-20 ENCOUNTER — Encounter (HOSPITAL_COMMUNITY): Payer: Self-pay | Admitting: Interventional Radiology

## 2019-03-20 ENCOUNTER — Ambulatory Visit (HOSPITAL_COMMUNITY)
Admission: RE | Admit: 2019-03-20 | Discharge: 2019-03-20 | Disposition: A | Discharge status: Home / Self Care | Payer: Medicare Other | Source: Ambulatory Visit | Attending: Interventional Radiology | Admitting: Interventional Radiology

## 2019-03-20 ENCOUNTER — Other Ambulatory Visit: Payer: Self-pay

## 2019-03-20 DIAGNOSIS — R633 Feeding difficulties, unspecified: Secondary | ICD-10-CM

## 2019-03-20 DIAGNOSIS — Z431 Encounter for attention to gastrostomy: Secondary | ICD-10-CM | POA: Insufficient documentation

## 2019-03-20 HISTORY — PX: IR REPLACE G-TUBE SIMPLE WO FLUORO: IMG2323

## 2019-03-26 ENCOUNTER — Encounter (HOSPITAL_COMMUNITY): Payer: Self-pay | Admitting: Speech Pathology

## 2019-03-26 ENCOUNTER — Other Ambulatory Visit: Payer: Self-pay

## 2019-03-26 ENCOUNTER — Ambulatory Visit (HOSPITAL_COMMUNITY)
Payer: Medicare Other | Attending: Student in an Organized Health Care Education/Training Program | Admitting: Speech Pathology

## 2019-03-26 DIAGNOSIS — E118 Type 2 diabetes mellitus with unspecified complications: Secondary | ICD-10-CM | POA: Diagnosis not present

## 2019-03-26 DIAGNOSIS — J449 Chronic obstructive pulmonary disease, unspecified: Secondary | ICD-10-CM | POA: Insufficient documentation

## 2019-03-26 DIAGNOSIS — R1312 Dysphagia, oropharyngeal phase: Secondary | ICD-10-CM

## 2019-03-26 DIAGNOSIS — K2970 Gastritis, unspecified, without bleeding: Secondary | ICD-10-CM | POA: Insufficient documentation

## 2019-03-26 DIAGNOSIS — I10 Essential (primary) hypertension: Secondary | ICD-10-CM | POA: Diagnosis not present

## 2019-03-26 DIAGNOSIS — M549 Dorsalgia, unspecified: Secondary | ICD-10-CM | POA: Insufficient documentation

## 2019-03-26 DIAGNOSIS — E785 Hyperlipidemia, unspecified: Secondary | ICD-10-CM | POA: Diagnosis not present

## 2019-03-26 DIAGNOSIS — G8929 Other chronic pain: Secondary | ICD-10-CM | POA: Insufficient documentation

## 2019-03-26 NOTE — Therapy (Signed)
[name], [place], [address] Phone: [phone]   Fax:  [phone]  Speech Language Pathology Evaluation/Clinical Swallow Evaluation  Patient Details  Name: Colin Wagner MRN: [id number] Date of Birth: 03/07/1951 No data recorded  Encounter Date: 03/26/2019  End of Session - 03/26/19 1237    Visit Number  1    Number of Visits  10    Date for SLP Re-Evaluation  05/10/19    Authorization Type  Medicare/Medicaid    SLP Start Time  0914    SLP Stop Time   1010    SLP Time Calculation (min)  56 min    Activity Tolerance  Patient tolerated treatment well       Past Medical History:  Diagnosis Date  . Alcohol abuse   . Alcoholic pancreatitis 7124   admission  . Chronic back pain   . Chronic pancreatitis ([place])    based on ct findings 2016  . COPD (chronic obstructive pulmonary disease) ([place])   . Diabetes mellitus    type 2  . Diverticulosis   . Gastritis   . GERD (gastroesophageal reflux disease)   . Headache   . History of radiation therapy 05/12/17- 06/22/17   Left cheek and bilateral neck/ 60 Gy in 30 fractions to gross disease  . Hyperlipidemia   . Hypertension   . 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 Peptic ulcer disease 1999   Per medical reports, no H pylori  . Pneumonia   . Renal cancer, left ([place]) 2012   he tells me that he has been released, ?and that he is free of cancer, and never had it to begin with.   . Right shoulder pain   . Testicular hypofunction     Past Surgical History:  Procedure Laterality Date  . COLONOSCOPY  2003   Dr. [name], polyps  . COLONOSCOPY  2005   Dr. [name], multiple diverticula  . COLONOSCOPY  2008   Dr. [name], diverticulosis  . COLONOSCOPY WITH PROPOFOL N/A 10/21/2016   Dr. [name]: Diverticulosis, two 5-7 mm polyps removed. path-tubular adenomas.  Next colonoscopy in 5 years.  . ESOPHAGOGASTRODUODENOSCOPY     Multiple EGDs.  1999 EGD showed gastric ulcers, no H pylori and benign biopsies performed by Dr. [name]. 2001 gastric ulcer healed. Last EGD 2005 had gastritis.  . IR REPLACE G-TUBE SIMPLE WO FLUORO  03/20/2019  . IR REPLC GASTRO/COLONIC TUBE PERCUT W/FLUORO  05/16/2018  . Left partial mandibulectomy, Scapular free flap reconstruction, selective neck dissection, tracheotomy, and resection of intraoral palate cancer. Left 03/01/2017   [facility]  . LUNG BIOPSY    . PARTIAL NEPHRECTOMY Left 2012  . POLYPECTOMY  10/21/2016   Procedure: POLYPECTOMY;  Surgeon: [name], MD;  Location: [facility] END[facility] SUITE;  Service: Endoscopy;;  hepatic flexure x2  . TRANSURETHRAL RESECTION OF PROSTATE N/A 03/01/2019   Procedure: TRANSURETHRAL RESECTION OF THE PROSTATE (TURP)WITH CYSTOSCOPY;  Surgeon: Raynelle Bring, MD;  Location: [facility] ORS;  Service: Urology;  Laterality: N/A;    There were no vitals filed for this visit.  Subjective Assessment - 03/26/19 1230    Subjective  "I eat a little bit."    Currently in Pain?  No/denies      MBSS completed 03/15/19 at [facility]: Extensive postsurgical changes after neck dissection and segmental left mandibulectomy with screw and plate reconstruction. Multilevel degenerative changes noted in  the cervical spine.  <<Patient presents with moderate-severe oral and moderate pharyngeal dysphagia secondary to extensive head and neck cancer history. Oral stage is marked by incompetent labial seal with anterior loss of liquids; lingual weakness results in prolonged and labored A/P transit. Mastication is also limited due to trismus and jaw reconstruction. Pharyngeal stage is characterized by reduced velopharyngeal seal, minimal hyolaryngeal excursion, decreased bolus propulsion, incomplete airway closure, and decreased distention of UES. Deficits result in nasal reflux/loss of intrabolus pressure, penetration across consistencies (penetration of puree and solid mixed with thin wash),  and moderate pharyngeal residue. At this time recommend that patient increase intake of puree /select soft solids with thin liquids and adherence to aspiration precautions below. Recommend patient take pills via G tube to reduce choking hazard. He will need to continue to obtain the majority of nutrition and hydration via his G tube as well. Patient was educated on risks and adverse outcomes associated with dysphagia and aspiration, and rationale for adherence to management recommendations. He could benefit from intensive outpatient swallowing therapy to address exercises and facilitate return to increased oral intake with possible goal of G tube removal. Patient elects to follow closer to home. Advised him to call his preferred facility to obtain the fax number for therapy department, and then contact Dr. Milana Na office to request referral. Patient could also potentially benefit from cervical esophageal dilation. However, we discussed that prognosis for significant improvement with this would be guarded given the complex nature of patient's dysphagia and likely multifactorial causes of his UES dysfunction. Patient and caregiver verbalized understanding of all exam results and recommendations. Will follow up at the discretion of managing physician and treating clinician.Diet: Mechanical Soft and puree, thin liquids; continue to supplement heavily with tube feedings, Aspiration Precautions: sit upright during all PO (as close to 90 degrees as possible), cough/clear throat intermittently during meal, alternate liquids and solids/puree>>    Prior Functional Status - 03/26/19 1232      Prior Functional Status   Cognitive/Linguistic Baseline  Within functional limits     Lives With  Significant other    Available Help at Discharge  Family    Vocation  On disability      General - 03/26/19 1232      General Information   Date of Onset  03/15/19    HPI [name] is a 68 yo male who was referred by  ENT, Dr. 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 Pathology showed T4aN1M0 SCCa (p16 negative), tumor size 4.5 cm, invading mandible, HPV negative, +LVSI, close margins <1 mm. Radiation treatment dates:05/12/2017 - 06/22/2017 Site/dose: Left Cheekand bilateralneck / 60 Gy in 19fractions to gross disease. Pt has a PEG which he uses for 6 cans of Glucerna daily, but does consume limited amounts of food/liquid by mouth (thin and soft).   Type of Study  Bedside Swallow Evaluation    Previous Swallow Assessment  MBSS 03/15/19 with Thomasene Mohair at [facility]    Diet Prior to this  Study  Dysphagia 3 (soft);Thin liquids;PEG tube    Temperature Spikes Noted  No    Respiratory Status  Room air    History of Recent Intubation  No    Behavior/Cognition  Alert;Cooperative    Oral Cavity Assessment  Dried secretions;Dry    Oral Care Completed by SLP  No    Oral Cavity - Dentition  Poor condition    Vision  Functional for self-feeding    Self-Feeding Abilities  Able to feed self    Patient Positioning  Upright in chair    Baseline Vocal Quality  Normal;Wet    Volitional Cough  Strong    Volitional Swallow  Able to elicit       Oral Motor/Sensory Function - 03/26/19 1235      Oral Motor/Sensory Function   Overall Oral Motor/Sensory Function  Moderate impairment    Facial ROM   Reduced left    Facial Symmetry  Abnormal symmetry left    Facial Strength  Reduced left    Lingual Symmetry  Abnormal symmetry left    Lingual Strength  Within Functional Limits    Lingual Sensation  Within Functional Limits    Velum  Within Functional Limits    Mandible  Impaired      Ice Chips - 03/26/19 1235      Ice Chips   Ice chips  Not tested      Thin Liquid - 03/26/19 1235      Thin Liquid   Thin Liquid  Impaired    Presentation  Cup;Straw    Oral Phase Impairments  Reduced labial seal    Oral Phase Functional Implications  Left anterior spillage    Pharyngeal  Phase Impairments  Suspected delayed Swallow;Cough - Immediate      Nectar thick liquid - 03/26/19 1236      Nectar Thick Liquid   Nectar Thick Liquid  Not tested        Solid - 03/26/19 1236      Solid   Solid  Not tested          SLP Education - 03/26/19 1231    Education Details  Plan for dysphagia treatment; given food intake log, aspiration precautions    Person(s) Educated  Patient    Methods  Explanation;Handout    Comprehension  Verbalized understanding       SLP Short Term Goals - 03/26/19 1240      SLP SHORT TERM GOAL #1   Title  Pt will complete pharyngeal strengthening exercises as assigned with min cues from SLP   Baseline  Does not have exercises yet   Time  5    Period  Weeks    Status  New    Target Date  05/10/19      SLP SHORT TERM GOAL #2   Title  Pt will increase po intake by 50% after one month of therapy with po trials each session with use of strategies and min cues.   Baseline  Currently taking ~24 oz semi solids per day   Time  5    Period  Weeks    Status  New    Target Date  05/10/19       SLP Long Term Goals - 03/26/19 1241      SLP LONG TERM GOAL #1   Title  Pt will increase oral intake for safe and efficient swallow of moist, soft textures and thin liquids to [facility] with use of strategies.  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 Pt is eating by mouth but reports dysgeusia and lack of interest in eating. He is motivated to have his PEG removed. Recommend dysphagia therapy to address the deficits noted in recent MBSS. Pt was given a food journal and soft food ideas this date and asked to complete going forward. Pt has an appointment with Dr. Vertell Limber to discuss whether UES dilation would be beneficial. He would also benefit from RD consult to help taper his tube feeds safely via PEG once Pt increases oral intake. Pt is motivated to participate in swallowing therapy and will aim to see Pt 2-3x per week for 2-3 weeks.    Speech Therapy Frequency  3x / week    Duration  4 weeks    Treatment/Interventions  Aspiration precaution training;Pharyngeal strengthening exercises;Oral motor exercises;Trials of upgraded texture/liquids;SLP instruction and feedback;Patient/family education;Compensatory strategies    Potential to Achieve Goals  Fair    Potential Considerations  Severity of impairments;Other (comment)   time post onset   SLP Home Exercise Plan  Pt will complete HEP as assigned to facilitate carryover of treatment strategies and techniques in home environment with use of written cues as needed.    Consulted and Agree with Plan of Care  Patient       Patient will benefit from skilled therapeutic intervention in order to improve the following deficits and impairments:   1. Dysphagia, oropharyngeal phase       Problem List Patient Active Problem List   Diagnosis Date Noted  . Urinary retention due to benign prostatic hyperplasia 03/01/2019  . Attention to G-tube ([place]) 02/08/2018  . UTI (urinary tract  infection) 04/20/2017  . UTI due to Klebsiella species 04/20/2017  . Essential hypertension 04/20/2017  . Chronic pancreatitis ([place]) 04/20/2017  . 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 Malnutrition of moderate degree 04/20/2017  . Complicated UTI (urinary tract infection)   . Carcinoma of buccal mucosa ([place]) 01/05/2017  . Rectal bleeding 09/28/2016  . Hyponatremia 04/28/2015  . Abdominal pain 04/27/2015  . Acute pancreatitis 04/27/2015  . 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 ETOH abuse 04/27/2015  . DM type 2 (diabetes mellitus, type 2) ([place]) 04/27/2015  . Hypertension, uncontrolled 04/27/2015  . Hyperlipidemia 04/27/2015  . Hypertensive urgency 04/27/2015  . Alcohol abuse    Thank you,  Genene Churn, [place]  [facility] 03/26/2019, 12:43 PM  [place], [place], [address] Phone: [phone]   Fax:  [phone]  Name: Colin Wagner MRN: [id number] Date of Birth: [birth year]

## 2019-03-30 ENCOUNTER — Encounter: Payer: Self-pay | Admitting: *Deleted

## 2019-03-30 NOTE — Progress Notes (Signed)
Oncology Nurse Navigator Documentation  Flowsheet/spreadsheet update.  Rick Dorn Hartshorne, RN, BSN Head & Neck Oncology Nurse Navigator [facility] [phone]   

## 2019-04-03 ENCOUNTER — Encounter (HOSPITAL_COMMUNITY): Payer: Self-pay | Admitting: Speech Pathology

## 2019-04-03 ENCOUNTER — Ambulatory Visit (HOSPITAL_COMMUNITY): Payer: Medicare Other | Admitting: Speech Pathology

## 2019-04-03 ENCOUNTER — Other Ambulatory Visit: Payer: Self-pay

## 2019-04-03 DIAGNOSIS — R1312 Dysphagia, oropharyngeal phase: Secondary | ICD-10-CM | POA: Diagnosis not present

## 2019-04-03 NOTE — Therapy (Signed)
[place], [place], [address] Phone: [phone]   Fax:  [phone]  Speech Language Pathology Treatment  Patient Details  Name: Colin Wagner MRN: [id number] Date of Birth: [birth year] No data recorded  Encounter Date: 04/03/2019  End of Session - 04/03/19 2305    Visit Number  2    Number of Visits  10    Date for SLP Re-Evaluation  05/10/19    Authorization Type  Medicare/Medicaid    SLP Start Time  1010    SLP Stop Time   1057    SLP Time Calculation (min)  47 min    Activity Tolerance  Patient tolerated treatment well       Past Medical History:  Diagnosis Date  . Alcohol abuse   . Alcoholic pancreatitis 9937   admission  . Chronic back pain   . Chronic pancreatitis ([place])    based on ct findings 2016  . COPD (chronic obstructive pulmonary disease) ([place])   . Diabetes mellitus    type 2  . Diverticulosis   . Gastritis   . GERD (gastroesophageal reflux disease)   . Headache   . History of radiation therapy 05/12/17- 06/22/17   Left cheek and bilateral neck/ 60 Gy in 30 fractions to gross disease  . Hyperlipidemia   . Hypertension   . 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 Peptic ulcer disease 1999   Per medical reports, no H pylori  . Pneumonia   . Renal cancer, left ([place]) 2012   he tells me that he has been released, ?and that he is free of cancer, and never had it to begin with.   . Right shoulder pain   . Testicular hypofunction     Past Surgical History:  Procedure Laterality Date  . COLONOSCOPY  2003   Dr. [name], polyps  . COLONOSCOPY  2005   Dr. [name], multiple diverticula  . COLONOSCOPY  2008   Dr. [name], diverticulosis  . COLONOSCOPY WITH PROPOFOL N/A 10/21/2016   Dr. [name]: Diverticulosis, two 5-7 mm polyps removed. path-tubular adenomas.  Next colonoscopy in 5 years.  . ESOPHAGOGASTRODUODENOSCOPY     Multiple EGDs. 1999 EGD showed gastric  ulcers, no H pylori and benign biopsies performed by Dr. [name]. 2001 gastric ulcer healed. Last EGD 2005 had gastritis.  . IR REPLACE G-TUBE SIMPLE WO FLUORO  03/20/2019  . IR REPLC GASTRO/COLONIC TUBE PERCUT W/FLUORO  05/16/2018  . Left partial mandibulectomy, Scapular free flap reconstruction, selective neck dissection, tracheotomy, and resection of intraoral palate cancer. Left 03/01/2017   [facility]  . LUNG BIOPSY    . PARTIAL NEPHRECTOMY Left 2012  . POLYPECTOMY  10/21/2016   Procedure: POLYPECTOMY;  Surgeon: [name], MD;  [facility];  Service: Endoscopy;;  hepatic flexure x2  . TRANSURETHRAL RESECTION OF PROSTATE N/A 03/01/2019   Procedure: TRANSURETHRAL RESECTION OF THE PROSTATE (TURP)WITH CYSTOSCOPY;  Surgeon: Raynelle Bring, MD;  Location: [facility];  Service: Urology;  Laterality: N/A;    There were no vitals filed for this visit.  Subjective Assessment - 04/03/19 2302    Subjective  "I've been trying to eat a little bit more."    Currently in Pain?  No/denies            ADULT SLP TREATMENT - 04/03/19 0001      General Information   Behavior/Cognition  Alert;Cooperative;Pleasant mood    Patient Positioning  Upright in chair    Oral care provided  N/A    HPI  [name] is a 68 yo male who was referred by ENT, Dr. 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 02/01/2017 Initial Diagnosis of Squamous cell carcinoma of retromolar trigone ([place]), 03/01/2017 Surgery: composite resection (segmental mandibulectomy, floor of mouth, infrastructure maxillectomy, left partial pharyngectomy, left buccal mucosa), left selective neck dissection (level 1a, 1b, 2a, 2b, 3, 4), left infratemporal fossa resection with sacrifice of V3 at foramen rotundum,  osteocutaneous free flap reconstruction, tracheotomy with bjork flap. Pathology showed T4aN1M0 SCCa (p16 negative), tumor size 4.5 cm, invading mandible, HPV negative, +LVSI, close margins <1 mm. Radiation treatment dates: 05/12/2017 - 06/22/2017 Site/dose: Left Cheek and bilateral neck / 60 Gy in 30 fractions to gross disease. Pt has a PEG which he uses for 6 cans of Glucerna daily, but does consume limited amounts of food/liquid by mouth (thin and soft).      Treatment Provided   Treatment provided  Dysphagia      Dysphagia Treatment   Temperature Spikes Noted  No    Respiratory Status  Room air    Oral Cavity - Dentition  Adequate natural dentition;Missing dentition    Treatment Methods  Skilled observation;Therapeutic exercise;Compensation strategy training;Patient/caregiver education    Patient observed directly with PO's  Yes    Type of PO's observed  Thin liquids    Feeding  Able to feed self    Liquids provided via  Teaspoon;Cup;Straw    Oral Phase Signs & Symptoms  Anterior loss/spillage    Pharyngeal Phase Signs & Symptoms  Delayed cough;Other (comment)   nasal regurgitation of water   Type of cueing  Verbal;Tactile;Visual    Amount of cueing  Moderate      Pain Assessment   Pain Assessment  No/denies pain      Assessment / Recommendations / Plan   Plan  Continue with current plan of care      Dysphagia Recommendations   Diet recommendations  Dysphagia 3 (mechanical soft);Thin liquid    Liquids provided via  Cup    Medication Administration  Via alternative means    Supervision  Patient able to self feed    Compensations  Slow rate;Small sips/bites;Clear throat intermittently;Multiple dry swallows after each bite/sip    Postural Changes and/or Swallow Maneuvers  Out of bed for meals;Seated upright 90 degrees;Upright 30-60 min after meal      General Recommendations   Oral Care Recommendations  Patient independent with oral care;Oral care BID      Progression Toward  Goals   Progression toward goals  Progressing toward goals         SLP Short Term Goals - 03/26/19 1240            SLP SHORT TERM GOAL #1   Title  Pt will complete pharyngeal strengthening exercises as assigned with min cues from SLP   Baseline  Does not have exercises yet   Time  5    Period  Weeks    Status  New    Target Date  05/10/19        SLP SHORT TERM GOAL #2   Title  Pt will increase po intake by 50% after one month of therapy with po  trials each session with use of strategies and min cues.   Baseline  Currently taking ~24 oz semi solids per Colin   Time  5    Period  Weeks    Status  New    Target Date  05/10/19      SLP Long Term Goals - 04/03/19 2306      SLP LONG TERM GOAL #1   Title  Pt will increase oral intake for safe and efficient swallow of moist, soft textures and thin liquids to [facility] with use of strategies.    Baseline  Pt relies on 6 cans of Glucerna per Colin    Time  5    Period  Weeks    Status  On-going       Plan - 04/03/19 2306    Clinical Impression Statement Pt consumed 8 oz water over the session with labial spillage and occasional cough. Pt cued to try to place liquid on right side (tried with straw), briefly hold, swallow, clear throat, and repeat swallow. Swallowing exercises were introduced with a focus on chin tuck against resistance (given ball), stretching oral structures, and increasing swallows with a bolus. Pt able to return demonstrate exercises after cues. He filled out his food journal, but did not list amounts of each item ingested. Continue plan of care next session.    Speech Therapy Frequency  3x / week    Duration  4 weeks    Treatment/Interventions  Aspiration precaution training;Pharyngeal strengthening exercises;Oral motor exercises;Trials of upgraded texture/liquids;SLP instruction and feedback;Patient/family education;Compensatory strategies    Potential to Achieve Goals  Fair    Potential Considerations   Severity of impairments;Other (comment)   time post onset   SLP Home Exercise Plan  Pt will complete HEP as assigned to facilitate carryover of treatment strategies and techniques in home environment with use of written cues as needed.    Consulted and Agree with Plan of Care  Patient       Patient will benefit from skilled therapeutic intervention in order to improve the following deficits and impairments:   1. Dysphagia, oropharyngeal phase       Problem List Patient Active Problem List   Diagnosis Date Noted  . Urinary retention due to benign prostatic hyperplasia 03/01/2019  . Attention to G-tube ([place]) 02/08/2018  . UTI (urinary tract infection) 04/20/2017  . UTI due to Klebsiella species 04/20/2017  . Essential hypertension 04/20/2017  . Chronic pancreatitis ([place]) 04/20/2017  . 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 Malnutrition of moderate degree 04/20/2017  . Complicated UTI (urinary tract infection)   . Carcinoma of buccal mucosa ([place]) 01/05/2017  . Rectal bleeding 09/28/2016  . Hyponatremia 04/28/2015  . Abdominal pain 04/27/2015  . Acute pancreatitis 04/27/2015  . Chronic alcoholic pancreatitis ([place]) 04/27/2015  . ETOH abuse 04/27/2015  . DM type 2 (diabetes mellitus, type 2) ([place]) 04/27/2015  . Hypertension, uncontrolled 04/27/2015  . Hyperlipidemia 04/27/2015  . Hypertensive urgency 04/27/2015  . Alcohol abuse    Thank you,  Genene Churn, [place]  [facility] 04/03/2019, 11:07 PM  [place], [place], [address] Phone: [phone]   Fax:  [phone]   Name: Colin Wagner MRN: [id number] Date of Birth: [birth year]

## 2019-04-04 ENCOUNTER — Ambulatory Visit (HOSPITAL_COMMUNITY): Payer: Medicare Other | Admitting: Speech Pathology

## 2019-04-04 DIAGNOSIS — R1312 Dysphagia, oropharyngeal phase: Secondary | ICD-10-CM | POA: Diagnosis not present

## 2019-04-05 ENCOUNTER — Encounter (HOSPITAL_COMMUNITY): Payer: Self-pay | Admitting: Speech Pathology

## 2019-04-05 ENCOUNTER — Ambulatory Visit (HOSPITAL_COMMUNITY): Payer: Medicare Other | Admitting: Speech Pathology

## 2019-04-05 NOTE — Therapy (Addendum)
[place], [place], [address] Phone: [phone]   Fax:  [phone]  Speech Language Pathology Treatment  Patient Details  Name: Colin Wagner MRN: [id number] Date of Birth: Aug 18, 1951 No data recorded  Encounter Date: 04/04/2019  End of Session - 04/04/19 1549    Visit Number  3    Number of Visits  10    Date for SLP Re-Evaluation  05/10/19    Authorization Type  Medicare/Medicaid    SLP Start Time  1005    SLP Stop Time   1048    SLP Time Calculation (min)  43 min    Activity Tolerance  Patient tolerated treatment well       Past Medical History:  Diagnosis Date  . Alcohol abuse   . Alcoholic pancreatitis 3428   admission  . Chronic back pain   . Chronic pancreatitis ([place])    based on ct findings 2016  . COPD (chronic obstructive pulmonary disease) ([place])   . Diabetes mellitus    type 2  . Diverticulosis   . Gastritis   . GERD (gastroesophageal reflux disease)   . Headache   . History of radiation therapy 05/12/17- 06/22/17   Left cheek and bilateral neck/ 60 Gy in 30 fractions to gross disease  . Hyperlipidemia   . Hypertension   . 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 Peptic ulcer disease 1999   Per medical reports, no H pylori  . Pneumonia   . Renal cancer, left ([place]) 2012   he tells me that he has been released, ?and that he is free of cancer, and never had it to begin with.   . Right shoulder pain   . Testicular hypofunction     Past Surgical History:  Procedure Laterality Date  . COLONOSCOPY  2003   Dr. [name], polyps  . COLONOSCOPY  2005   Dr. [name], multiple diverticula  . COLONOSCOPY  2008   Dr. [name], diverticulosis  . COLONOSCOPY WITH PROPOFOL N/A 10/21/2016   Dr. [name]: Diverticulosis, two 5-7 mm polyps removed. path-tubular adenomas.  Next colonoscopy in 5 years.  . ESOPHAGOGASTRODUODENOSCOPY     Multiple EGDs. 1999 EGD showed gastric  ulcers, no H pylori and benign biopsies performed by Dr. [name]. 2001 gastric ulcer healed. Last EGD 2005 had gastritis.  . IR REPLACE G-TUBE SIMPLE WO FLUORO  03/20/2019  . IR REPLC GASTRO/COLONIC TUBE PERCUT W/FLUORO  05/16/2018  . Left partial mandibulectomy, Scapular free flap reconstruction, selective neck dissection, tracheotomy, and resection of intraoral palate cancer. Left 03/01/2017   [facility]  . LUNG BIOPSY    . PARTIAL NEPHRECTOMY Left 2012  . POLYPECTOMY  10/21/2016   Procedure: POLYPECTOMY;  Surgeon: [name], MD;  [facility];  Service: Endoscopy;;  hepatic flexure x2  . TRANSURETHRAL RESECTION OF PROSTATE N/A 03/01/2019   Procedure: TRANSURETHRAL RESECTION OF THE PROSTATE (TURP)WITH CYSTOSCOPY;  Surgeon: Raynelle Bring, MD;  Location: [facility];  Service: Urology;  Laterality: N/A;    There were no vitals filed for this visit.  Subjective Assessment - 04/04/19 1547    Subjective  "I don't like cinnamon."    Currently in Pain?  No/denies        ADULT SLP TREATMENT - 04/04/19 0001      General Information   Behavior/Cognition  Alert;Cooperative;Pleasant mood    Patient Positioning  Upright  in chair    Oral care provided  N/A    HPI  Colin Wagner is a 68 yo male who was referred by ENT, Dr. 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 02/01/2017 Initial Diagnosis of Squamous cell carcinoma of retromolar trigone ([place]), 03/01/2017 Surgery: composite resection (segmental mandibulectomy, floor of mouth, infrastructure maxillectomy, left partial pharyngectomy, left buccal mucosa), left selective neck dissection (level 1a, 1b, 2a, 2b, 3, 4), left infratemporal fossa resection with sacrifice of V3 at foramen rotundum, osteocutaneous free flap  reconstruction, tracheotomy with bjork flap. Pathology showed T4aN1M0 SCCa (p16 negative), tumor size 4.5 cm, invading mandible, HPV negative, +LVSI, close margins <1 mm. Radiation treatment dates: 05/12/2017 - 06/22/2017 Site/dose: Left Cheek and bilateral neck / 60 Gy in 30 fractions to gross disease. Pt has a PEG which he uses for 6 cans of Glucerna daily, but does consume limited amounts of food/liquid by mouth (thin and soft).      Treatment Provided   Treatment provided  Dysphagia      Dysphagia Treatment   Temperature Spikes Noted  No    Respiratory Status  Room air    Oral Cavity - Dentition  Adequate natural dentition;Missing dentition    Treatment Methods  Skilled observation;Therapeutic exercise;Compensation strategy training;Patient/caregiver education    Patient observed directly with PO's  Yes    Type of PO's observed  Thin liquids;Dysphagia 3 (soft);Dysphagia 1 (puree)    Feeding  Able to feed self    Liquids provided via  Teaspoon;Cup;Straw    Oral Phase Signs & Symptoms  Anterior loss/spillage;Prolonged bolus formation    Pharyngeal Phase Signs & Symptoms  Suspected delayed swallow initiation    Type of cueing  Verbal;Tactile;Visual    Amount of cueing  Minimal      Pain Assessment   Pain Assessment  No/denies pain      Assessment / Recommendations / Plan   Plan  Continue with current plan of care      Dysphagia Recommendations   Diet recommendations  Dysphagia 3 (mechanical soft);Thin liquid    Liquids provided via  Cup    Medication Administration  Via alternative means    Supervision  Patient able to self feed    Compensations  Slow rate;Small sips/bites;Clear throat intermittently;Multiple dry swallows after each bite/sip    Postural Changes and/or Swallow Maneuvers  Out of bed for meals;Seated upright 90 degrees;Upright 30-60 min after meal      General Recommendations   Oral Care Recommendations  Patient independent with oral care;Oral care BID          SLP Short Term Goals - 04/04/19 1550      SLP SHORT TERM GOAL #1   Title  Pt will complete pharyngeal strengthening exercises as assigned with min cues from SLP    Baseline  not completing yet    Time  5    Period  Weeks    Status  On-going    Target Date  05/10/19      SLP SHORT TERM GOAL #2   Title  Pt will increase po intake by 50% after one month of therapy with po trials each session with use of strategies and min cues.    Baseline  Currently taking ~24 oz total by mouth per day    Time  5  Period  Weeks    Status  On-going    Target Date  05/10/19       SLP Long Term Goals - 04/05/19 1550      SLP LONG TERM GOAL #1   Title  Pt will increase oral intake for safe and efficient swallow of moist, soft textures and thin liquids to [facility] with use of strategies.    Baseline  Pt relies on 6 cans of Glucerna per day    Time  5    Period  Weeks    Status  On-going       Plan - 04/04/19 1550    Clinical Impression Statement  Pt consumed 8 oz water, 4 oz puree, and a few bites of mechanical soft textures over the session with labial spillage, occasional cough, and min oral residuals with mech soft textures. Pt reminded to briefly hold, swallow, clear throat, and repeat swallow. Swallowing exercises were reviewed. He filled out his food journal, but did not remember to bring his folder. Pt unable to tolerate cinnamon in the cereal bar today due to burning sensation. Pt encouraged to record his daily oral intake and bring back next session. He had to cancel some appointments due to other MD appointments. SLP wrote a list of Pt's questions down for him to take to his MD appointments. RD consult would be helpful to assist with tapering PEG feeds. Continue plan of care next session.    Speech Therapy Frequency  3x / week    Duration  4 weeks    Treatment/Interventions  Aspiration precaution training;Pharyngeal strengthening exercises;Oral motor exercises;Trials of upgraded  texture/liquids;SLP instruction and feedback;Patient/family education;Compensatory strategies    Potential to Achieve Goals  Fair    Potential Considerations  Severity of impairments;Other (comment)   time post onset   SLP Home Exercise Plan  Pt will complete HEP as assigned to facilitate carryover of treatment strategies and techniques in home environment with use of written cues as needed.    Consulted and Agree with Plan of Care  Patient       Patient will benefit from skilled therapeutic intervention in order to improve the following deficits and impairments:   1. Dysphagia, oropharyngeal phase       Problem List Patient Active Problem List   Diagnosis Date Noted  . Urinary retention due to benign prostatic hyperplasia 03/01/2019  . Attention to G-tube ([place]) 02/08/2018  . UTI (urinary tract infection) 04/20/2017  . UTI due to Klebsiella species 04/20/2017  . Essential hypertension 04/20/2017  . Chronic pancreatitis ([place]) 04/20/2017  . Squamous cell carcinoma of mandible ([place]) 04/20/2017  . Malnutrition of moderate degree 04/20/2017  . Complicated UTI (urinary tract infection)   . Carcinoma of buccal mucosa ([place]) 01/05/2017  . Rectal bleeding 09/28/2016  . Hyponatremia 04/28/2015  . Abdominal pain 04/27/2015  . Acute pancreatitis 04/27/2015  . Chronic alcoholic pancreatitis ([place]) 04/27/2015  . ETOH abuse 04/27/2015  . DM type 2 (diabetes mellitus, type 2) ([place]) 04/27/2015  . Hypertension, uncontrolled 04/27/2015  . Hyperlipidemia 04/27/2015  . Hypertensive urgency 04/27/2015  . Alcohol abuse     Addendum  SPEECH THERAPY DISCHARGE SUMMARY  Visits from Start of Care: 3  Current functional level related to goals / functional outcomes: See above.    Remaining deficits: Pt's family member called to cancel appointment following MD visit. SLP called to ask about latest appointment with Dr. Vertell Limber (unable to see any notes in Epic at this time). Pt wishes to discharge  from  SLP therapy at this time, as he will have another reconstructive surgery per Pt's spouse. She was unsure whether UES dilation is planned. Pt encouraged to continue with oral care, exercises, and eating by mouth as able. SLP will discharge Pt from SLP services at this time.   Education / Equipment: Pt should continue with HEP as assigned.  Plan: Patient agrees to discharge.  Patient goals were partially met. Patient is being discharged due to the patient's request.  ?????          Thank you,  Genene Churn, [place]  [facility] 04/04/2019, 3:51 PM  [place] [address] [place], [place], [address] Phone: [phone]   Fax:  [phone]   Name: Colin Wagner MRN: [id number] Date of Birth: [birth year]

## 2019-04-09 ENCOUNTER — Telehealth (HOSPITAL_COMMUNITY): Payer: Self-pay | Admitting: Family Medicine

## 2019-04-09 ENCOUNTER — Ambulatory Visit (HOSPITAL_COMMUNITY): Payer: Medicare Other | Admitting: Speech Pathology

## 2019-04-09 NOTE — Telephone Encounter (Signed)
04/09/19  caller just said she needed to cancel his appt... no reason was given

## 2019-04-11 ENCOUNTER — Encounter (HOSPITAL_COMMUNITY): Payer: Medicare Other | Admitting: Speech Pathology

## 2019-04-12 ENCOUNTER — Encounter (HOSPITAL_COMMUNITY): Payer: Self-pay | Admitting: Speech Pathology

## 2019-04-12 ENCOUNTER — Ambulatory Visit (HOSPITAL_COMMUNITY): Payer: Medicare Other | Admitting: Speech Pathology

## 2019-04-12 ENCOUNTER — Telehealth (HOSPITAL_COMMUNITY): Payer: Self-pay | Admitting: Family Medicine

## 2019-04-12 ENCOUNTER — Telehealth (HOSPITAL_COMMUNITY): Payer: Self-pay | Admitting: Speech Pathology

## 2019-04-12 NOTE — Telephone Encounter (Signed)
caller said that they were cancelling because the dr made some changes... we can call if we need to know what the changes were  04/12/19

## 2019-04-12 NOTE — Telephone Encounter (Signed)
Telephone Call  Pt's family member called to cancel this morning's appointment this AM. SLP called to ask about latest appointment with Dr. Vertell Limber (unable to see any notes in Epic at this time). Pt wishes to discharge from SLP therapy at this time, as he will have another reconstructive surgery per Pt's spouse. She was unsure whether UES dilation is planned. Pt encouraged to continue with oral care, exercises, and eating by mouth as able. SLP will discharge Pt from SLP services at this time.  Thank you,  Genene Churn, CCC-SLP [phone]

## 2019-04-17 NOTE — Progress Notes (Signed)
Colin Wagner presents for follow up of radiation completed 06/22/2017 to his left cheek and bilateral neck.  Pain issues, if any: He denies.  Using a feeding tube?: Yes, he instills 6 supplements daily, and free water Weight changes, if any:  Wt Readings from Last 3 Encounters:  04/20/19 182 lb (82.6 kg)  03/01/19 180 lb 4 oz (81.8 kg)  02/26/19 180 lb 4 oz (81.8 kg)   Swallowing issues, if any: He is able to drink liquids and soft foods. He does tell me food gets stuck at times in his throat.  Smoking or chewing tobacco? No Using fluoride trays daily? No Last ENT visit was on: Dr. Vertell Limber on 04/11/19 at [facility].  Other notable issues, if any:   BP 106/80 (BP Location: Right Arm)   Pulse (!) 107   Temp 98.7 F (37.1 C) (Tympanic)   Wt 182 lb (82.6 kg)   SpO2 99% Comment: room air  BMI 25.38 kg/m

## 2019-04-19 ENCOUNTER — Telehealth: Payer: Self-pay | Admitting: *Deleted

## 2019-04-19 NOTE — Telephone Encounter (Signed)
CALLED PATIENT TO REMIND OF LAB AND FU ON 04-20-19, LVM FOR A RETURN CALL

## 2019-04-20 ENCOUNTER — Other Ambulatory Visit: Payer: Self-pay

## 2019-04-20 ENCOUNTER — Ambulatory Visit
Admission: RE | Admit: 2019-04-20 | Discharge: 2019-04-20 | Disposition: A | Discharge status: Home / Self Care | Payer: Medicare Other | Source: Ambulatory Visit | Attending: Radiation Oncology | Admitting: Radiation Oncology

## 2019-04-20 ENCOUNTER — Encounter: Payer: Self-pay | Admitting: Radiation Oncology

## 2019-04-20 DIAGNOSIS — Z8589 Personal history of malignant neoplasm of other organs and systems: Secondary | ICD-10-CM | POA: Insufficient documentation

## 2019-04-20 DIAGNOSIS — Z79899 Other long term (current) drug therapy: Secondary | ICD-10-CM | POA: Diagnosis not present

## 2019-04-20 DIAGNOSIS — Z7984 Long term (current) use of oral hypoglycemic drugs: Secondary | ICD-10-CM | POA: Insufficient documentation

## 2019-04-20 DIAGNOSIS — C060 Malignant neoplasm of cheek mucosa: Secondary | ICD-10-CM

## 2019-04-20 DIAGNOSIS — Z7982 Long term (current) use of aspirin: Secondary | ICD-10-CM | POA: Insufficient documentation

## 2019-04-20 DIAGNOSIS — Z1329 Encounter for screening for other suspected endocrine disorder: Secondary | ICD-10-CM

## 2019-04-20 DIAGNOSIS — Z923 Personal history of irradiation: Secondary | ICD-10-CM | POA: Diagnosis not present

## 2019-04-20 LAB — TSH: TSH: 2.251 u[IU]/mL (ref 0.320–4.118)

## 2019-04-20 NOTE — Progress Notes (Signed)
Radiation Oncology         [phone] ________________________________  Name: Colin Wagner MRN: [id number]  Date: 04/20/2019  DOB: [birth year]  Follow-Up Visit Note face-to-face  CC: [name], MD  [name], MD  Diagnosis and Prior Radiotherapy:       ICD-10-CM   1. 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   Pain issues, if any: he denies.   Using a feeding tube?: Yes, he instills 6 supplements daily, and free water  Weight changes, if any:  Wt Readings from Last 3 Encounters:  04/20/19 182 lb (82.6 kg)  03/01/19 180 lb 4 oz (81.8 kg)  02/26/19 180 lb 4 oz (81.8 kg)   Swallowing issues, if any: He is able to drink liquids and soft foods. He does tell me food gets stuck at times in his throat.   Smoking or chewing tobacco? No  Using fluoride trays daily? N/A, and not up to date on dental visits.  Last ENT visit was on: Dr. Vertell Limber (resident, with Dr. Hendricks Limes) on 04/11/2019 at [facility]. NED. Other notable issues, if any: Per Dr. 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 He also continues to be followed by speech therapy at [facility].    ALLERGIES:  has No Known Allergies.  Meds: Current Outpatient Medications  Medication Sig Dispense Refill  . aspirin EC 81 MG tablet Take 81 mg by mouth daily.    Marland Kitchen atorvastatin (LIPITOR) 40 MG tablet Take 40 mg by mouth daily at 6 PM.     . cetirizine (ZYRTEC) 10 MG tablet Take 10 mg by mouth daily.    . cyclobenzaprine (FLEXERIL) 10 MG tablet Take 10 mg by mouth 2 (two)  times a day.     . fluticasone (FLONASE) 50 MCG/ACT nasal spray Place 2 sprays into both nostrils daily.     Marland Kitchen glipiZIDE (GLUCOTROL XL) 2.5 MG 24 hr tablet Take 2.5 mg by mouth every morning.    Marland Kitchen HYDROcodone-acetaminophen (NORCO) 7.5-325 MG tablet Take 1 tablet by mouth every 6 (six) hours as needed (pain). (Patient taking differently: Take 1 tablet by mouth every 6 (six) hours as needed for moderate pain. ) 60 tablet 0  . lidocaine (XYLOCAINE) 2 % solution Patient: Mix 1part 2% viscous lidocaine, 1part H20. Swish &/or swallow 57mL of diluted mixture, 65min before meals and at bedtime, up to QID (Patient taking differently: Use as directed 5 mLs in the mouth or throat 3 (three) times daily as needed for mouth pain. Patient: Mix 1part 2% viscous lidocaine, 1part H20. Swish &/or swallow.) 100 mL 5  . lisinopril (PRINIVIL,ZESTRIL) 5 MG tablet Take 5 mg by mouth daily.     Marland Kitchen LORazepam (ATIVAN) 1 MG tablet Take 1 mg by mouth 4 (four) times daily as needed for anxiety.   5  . metFORMIN (GLUCOPHAGE-XR) 500 MG 24 hr tablet Take 500-1,000 mg by mouth See admin instructions. Take 1000 mg by mouth in the morning, take 500 mg by mouth in  the afternoon and 500 mg by mouth in the evening  3  . Nutritional Supplements (GLUCERNA 1.5 CAL PO) 480 mLs by PEG Tube route 3 (three) times daily. 120 ml water before 262ml water after    . pantoprazole (PROTONIX) 40 MG tablet Take 40 mg by mouth daily.     . phenazopyridine (PYRIDIUM) 200 MG tablet Take 1 tablet (200 mg total) by mouth 3 (three) times daily as needed for pain (burning with urination). 20 tablet 0  . senna (SENOKOT) 8.6 MG TABS tablet Take 1 tablet (8.6 mg total) by mouth at bedtime as needed for mild constipation. May be needed as pain medication can constipate. 120 each 1  . traZODone (DESYREL) 50 MG tablet Take 100 mg by mouth at bedtime.     . Water For Irrigation, Sterile (FREE WATER) SOLN Place 100 mLs into feeding tube every 8 (eight) hours. 1000 mL 0    No current facility-administered medications for this encounter.     Physical Findings: Wt Readings from Last 3 Encounters:  04/20/19 182 lb (82.6 kg)  03/01/19 180 lb 4 oz (81.8 kg)  02/26/19 180 lb 4 oz (81.8 kg)    weight is 182 lb (82.6 kg). His tympanic temperature is 98.7 F (37.1 C). His blood pressure is 106/80 and his pulse is 107 (abnormal). His oxygen saturation is 99%.   General: Alert and oriented, in no acute distress.  His speech is hard to understand HEENT: The tissue flap appears healthy. He has a very dry mouth. No lesions. Lymphedema in the left face particularly along the left jaw all above the surgical scar in the left neck persists. Drooping of left mouth from surgical change - stable. He has fibrosis concentrated in the left neck consistent with prior surgery and radiation. No palpable masses noted.  skin: intact in RT fields Psych: appropriate affect, judgment appears intact.     Lab Findings: Lab Results  Component Value Date   WBC 5.8 02/26/2019   HGB 11.3 (L) 02/26/2019   HCT 36.3 (L) 02/26/2019   MCV 89.6 02/26/2019   PLT 205 02/26/2019    Lab Results  Component Value Date   TSH 1.451 04/14/2018    Radiographic Findings: No results found.  Impression/Plan:    1) Head and Neck Cancer Status: NED   2) Nutritional Status: stable, still needs PEG  3) Risk Factors: The patient has been educated about risk factors including alcohol and tobacco abuse; they understand that avoidance of alcohol and tobacco is important to prevent recurrences as well as other cancers. The patient is not using tobacco.    4) Swallowing: limited to certain foods -has been referred back to speech-language pathology at [facility].  Hopefully this will help with his swallowing function and his speech function.  5) Dental: Encouraged to continue regular followup with dentistry, and dental hygiene including fluoride and flossing.   6) Thyroid function: Pending result today  Lab Results  Component Value Date   TSH 1.451 04/14/2018    7) Other:   Continue to follow closely with otolaryngology.  They have restaging scans scheduled.  I will see him back in early April.    _____________________________________   [name], MD  This document serves as a record of services personally performed by [name], MD. It was created on her behalf by Wilburn Mylar, a trained medical scribe. The creation of this record is based on the scribe's personal observations and the provider's statements to them. This  document has been checked and approved by the attending provider.

## 2019-06-24 HISTORY — PX: SURGERY OF LIP: SUR1315

## 2019-07-10 ENCOUNTER — Other Ambulatory Visit: Payer: Self-pay

## 2019-07-10 DIAGNOSIS — Z20822 Contact with and (suspected) exposure to covid-19: Secondary | ICD-10-CM

## 2019-07-16 LAB — NOVEL CORONAVIRUS, NAA: SARS-CoV-2, NAA: NOT DETECTED

## 2019-07-27 ENCOUNTER — Other Ambulatory Visit: Payer: Self-pay

## 2019-07-27 ENCOUNTER — Inpatient Hospital Stay (HOSPITAL_COMMUNITY)
Admission: EM | Admit: 2019-07-27 | Discharge: 2019-07-31 | DRG: 177 | Disposition: A | Discharge status: Home / Self Care | Payer: Medicare Other | Attending: Internal Medicine | Admitting: Internal Medicine

## 2019-07-27 ENCOUNTER — Encounter (HOSPITAL_COMMUNITY): Payer: Self-pay | Admitting: *Deleted

## 2019-07-27 ENCOUNTER — Emergency Department (HOSPITAL_COMMUNITY): Payer: Medicare Other

## 2019-07-27 DIAGNOSIS — Z7984 Long term (current) use of oral hypoglycemic drugs: Secondary | ICD-10-CM

## 2019-07-27 DIAGNOSIS — N182 Chronic kidney disease, stage 2 (mild): Secondary | ICD-10-CM | POA: Diagnosis present

## 2019-07-27 DIAGNOSIS — E119 Type 2 diabetes mellitus without complications: Secondary | ICD-10-CM

## 2019-07-27 DIAGNOSIS — N179 Acute kidney failure, unspecified: Secondary | ICD-10-CM | POA: Diagnosis not present

## 2019-07-27 DIAGNOSIS — E1165 Type 2 diabetes mellitus with hyperglycemia: Secondary | ICD-10-CM | POA: Diagnosis not present

## 2019-07-27 DIAGNOSIS — C060 Malignant neoplasm of cheek mucosa: Secondary | ICD-10-CM | POA: Diagnosis not present

## 2019-07-27 DIAGNOSIS — K5790 Diverticulosis of intestine, part unspecified, without perforation or abscess without bleeding: Secondary | ICD-10-CM | POA: Diagnosis present

## 2019-07-27 DIAGNOSIS — Z905 Acquired absence of kidney: Secondary | ICD-10-CM

## 2019-07-27 DIAGNOSIS — E785 Hyperlipidemia, unspecified: Secondary | ICD-10-CM | POA: Diagnosis present

## 2019-07-27 DIAGNOSIS — K219 Gastro-esophageal reflux disease without esophagitis: Secondary | ICD-10-CM | POA: Diagnosis present

## 2019-07-27 DIAGNOSIS — E11319 Type 2 diabetes mellitus with unspecified diabetic retinopathy without macular edema: Secondary | ICD-10-CM

## 2019-07-27 DIAGNOSIS — I10 Essential (primary) hypertension: Secondary | ICD-10-CM | POA: Diagnosis present

## 2019-07-27 DIAGNOSIS — E875 Hyperkalemia: Secondary | ICD-10-CM | POA: Diagnosis present

## 2019-07-27 DIAGNOSIS — N2589 Other disorders resulting from impaired renal tubular function: Secondary | ICD-10-CM | POA: Diagnosis present

## 2019-07-27 DIAGNOSIS — Z87891 Personal history of nicotine dependence: Secondary | ICD-10-CM

## 2019-07-27 DIAGNOSIS — Z7982 Long term (current) use of aspirin: Secondary | ICD-10-CM

## 2019-07-27 DIAGNOSIS — J440 Chronic obstructive pulmonary disease with acute lower respiratory infection: Secondary | ICD-10-CM | POA: Diagnosis present

## 2019-07-27 DIAGNOSIS — Z79899 Other long term (current) drug therapy: Secondary | ICD-10-CM

## 2019-07-27 DIAGNOSIS — Z8249 Family history of ischemic heart disease and other diseases of the circulatory system: Secondary | ICD-10-CM

## 2019-07-27 DIAGNOSIS — Z85528 Personal history of other malignant neoplasm of kidney: Secondary | ICD-10-CM

## 2019-07-27 DIAGNOSIS — U071 COVID-19: Secondary | ICD-10-CM | POA: Diagnosis not present

## 2019-07-27 DIAGNOSIS — Z931 Gastrostomy status: Secondary | ICD-10-CM

## 2019-07-27 DIAGNOSIS — Z431 Encounter for attention to gastrostomy: Secondary | ICD-10-CM

## 2019-07-27 DIAGNOSIS — Z8711 Personal history of peptic ulcer disease: Secondary | ICD-10-CM

## 2019-07-27 DIAGNOSIS — E860 Dehydration: Secondary | ICD-10-CM | POA: Diagnosis present

## 2019-07-27 DIAGNOSIS — K860 Alcohol-induced chronic pancreatitis: Secondary | ICD-10-CM | POA: Diagnosis present

## 2019-07-27 DIAGNOSIS — E871 Hypo-osmolality and hyponatremia: Secondary | ICD-10-CM | POA: Diagnosis present

## 2019-07-27 DIAGNOSIS — Z923 Personal history of irradiation: Secondary | ICD-10-CM

## 2019-07-27 DIAGNOSIS — Z794 Long term (current) use of insulin: Secondary | ICD-10-CM

## 2019-07-27 DIAGNOSIS — J9601 Acute respiratory failure with hypoxia: Secondary | ICD-10-CM | POA: Diagnosis present

## 2019-07-27 DIAGNOSIS — J1289 Other viral pneumonia: Secondary | ICD-10-CM | POA: Diagnosis present

## 2019-07-27 DIAGNOSIS — I129 Hypertensive chronic kidney disease with stage 1 through stage 4 chronic kidney disease, or unspecified chronic kidney disease: Secondary | ICD-10-CM | POA: Diagnosis present

## 2019-07-27 DIAGNOSIS — E1122 Type 2 diabetes mellitus with diabetic chronic kidney disease: Secondary | ICD-10-CM | POA: Diagnosis present

## 2019-07-27 DIAGNOSIS — K861 Other chronic pancreatitis: Secondary | ICD-10-CM | POA: Diagnosis present

## 2019-07-27 DIAGNOSIS — Z85818 Personal history of malignant neoplasm of other sites of lip, oral cavity, and pharynx: Secondary | ICD-10-CM

## 2019-07-27 LAB — CBC WITH DIFFERENTIAL/PLATELET
Abs Immature Granulocytes: 0.02 10*3/uL (ref 0.00–0.07)
Basophils Absolute: 0 10*3/uL (ref 0.0–0.1)
Basophils Relative: 0 %
Eosinophils Absolute: 0.1 10*3/uL (ref 0.0–0.5)
Eosinophils Relative: 1 %
HCT: 38.6 % — ABNORMAL LOW (ref 39.0–52.0)
Hemoglobin: 11.9 g/dL — ABNORMAL LOW (ref 13.0–17.0)
Immature Granulocytes: 0 %
Lymphocytes Relative: 7 %
Lymphs Abs: 0.4 10*3/uL — ABNORMAL LOW (ref 0.7–4.0)
MCH: 25 pg — ABNORMAL LOW (ref 26.0–34.0)
MCHC: 30.8 g/dL (ref 30.0–36.0)
MCV: 81.1 fL (ref 80.0–100.0)
Monocytes Absolute: 0.4 10*3/uL (ref 0.1–1.0)
Monocytes Relative: 6 %
Neutro Abs: 5 10*3/uL (ref 1.7–7.7)
Neutrophils Relative %: 86 %
Platelets: 149 10*3/uL — ABNORMAL LOW (ref 150–400)
RBC: 4.76 MIL/uL (ref 4.22–5.81)
RDW: 17.4 % — ABNORMAL HIGH (ref 11.5–15.5)
WBC: 5.9 10*3/uL (ref 4.0–10.5)
nRBC: 0 % (ref 0.0–0.2)

## 2019-07-27 LAB — GLUCOSE, CAPILLARY
Glucose-Capillary: 190 mg/dL — ABNORMAL HIGH (ref 70–99)
Glucose-Capillary: 190 mg/dL — ABNORMAL HIGH (ref 70–99)

## 2019-07-27 LAB — COMPREHENSIVE METABOLIC PANEL
ALT: 18 U/L (ref 0–44)
AST: 25 U/L (ref 15–41)
Albumin: 3.1 g/dL — ABNORMAL LOW (ref 3.5–5.0)
Alkaline Phosphatase: 94 U/L (ref 38–126)
Anion gap: 13 (ref 5–15)
BUN: 43 mg/dL — ABNORMAL HIGH (ref 8–23)
CO2: 24 mmol/L (ref 22–32)
Calcium: 8.4 mg/dL — ABNORMAL LOW (ref 8.9–10.3)
Chloride: 94 mmol/L — ABNORMAL LOW (ref 98–111)
Creatinine, Ser: 1.54 mg/dL — ABNORMAL HIGH (ref 0.61–1.24)
GFR calc Af Amer: 53 mL/min — ABNORMAL LOW (ref >60)
GFR calc non Af Amer: 45 mL/min — ABNORMAL LOW (ref >60)
Glucose, Bld: 317 mg/dL — ABNORMAL HIGH (ref 70–99)
Potassium: 5.2 mmol/L — ABNORMAL HIGH (ref 3.5–5.1)
Sodium: 131 mmol/L — ABNORMAL LOW (ref 135–145)
Total Bilirubin: 0.3 mg/dL (ref 0.3–1.2)
Total Protein: 6.8 g/dL (ref 6.5–8.1)

## 2019-07-27 LAB — URINALYSIS, ROUTINE W REFLEX MICROSCOPIC
Bacteria, UA: NONE SEEN
Bilirubin Urine: NEGATIVE
Glucose, UA: >=500 mg/dL — AB
Ketones, ur: NEGATIVE mg/dL
Nitrite: NEGATIVE
Protein, ur: 30 mg/dL — AB
RBC / HPF: >50 RBC/hpf — ABNORMAL HIGH (ref 0–5)
Specific Gravity, Urine: 1.012 (ref 1.005–1.030)
pH: 5 (ref 5.0–8.0)

## 2019-07-27 LAB — CBG MONITORING, ED: Glucose-Capillary: 253 mg/dL — ABNORMAL HIGH (ref 70–99)

## 2019-07-27 LAB — LIPASE, BLOOD: Lipase: 22 U/L (ref 11–51)

## 2019-07-27 LAB — HEMOGLOBIN A1C
Hgb A1c MFr Bld: 10.4 % — ABNORMAL HIGH (ref 4.8–5.6)
Mean Plasma Glucose: 251.78 mg/dL

## 2019-07-27 LAB — TROPONIN I (HIGH SENSITIVITY)
Troponin I (High Sensitivity): 17 ng/L (ref <18)
Troponin I (High Sensitivity): 16 ng/L (ref <18)

## 2019-07-27 LAB — POC SARS CORONAVIRUS 2 AG -  ED: SARS Coronavirus 2 Ag: POSITIVE — AB

## 2019-07-27 MED ORDER — TRAZODONE HCL 50 MG PO TABS
100.0000 mg | ORAL_TABLET | Freq: Every day | ORAL | Status: DC
  Administered 2019-07-27 – 2019-07-30 (×4): 100 mg via ORAL
  Filled 2019-07-27 (×4): qty 2

## 2019-07-27 MED ORDER — LIDOCAINE VISCOUS HCL 2 % MT SOLN: 5.0000 mL | Freq: Three times a day (TID) | OROMUCOSAL | Status: DC | PRN

## 2019-07-27 MED ORDER — SENNA 8.6 MG PO TABS: 1.0000 | ORAL_TABLET | Freq: Every evening | ORAL | Status: DC | PRN

## 2019-07-27 MED ORDER — LORAZEPAM 1 MG PO TABS
1.0000 mg | ORAL_TABLET | Freq: Four times a day (QID) | ORAL | Status: DC | PRN
  Administered 2019-07-28 – 2019-07-30 (×5): 1 mg via ORAL
  Filled 2019-07-27 (×5): qty 1

## 2019-07-27 MED ORDER — ATORVASTATIN CALCIUM 40 MG PO TABS
40.0000 mg | ORAL_TABLET | Freq: Every day | ORAL | Status: DC
  Administered 2019-07-27 – 2019-07-30 (×4): 40 mg via ORAL
  Filled 2019-07-27 (×4): qty 1

## 2019-07-27 MED ORDER — ASPIRIN EC 81 MG PO TBEC
81.0000 mg | DELAYED_RELEASE_TABLET | Freq: Every day | ORAL | Status: DC
  Administered 2019-07-28 – 2019-07-31 (×4): 81 mg via ORAL
  Filled 2019-07-27 (×4): qty 1

## 2019-07-27 MED ORDER — SODIUM CHLORIDE 0.9 % IV SOLN: Freq: Once | INTRAVENOUS | Status: DC

## 2019-07-27 MED ORDER — HYDROCODONE-ACETAMINOPHEN 5-325 MG PO TABS: 1.5000 | ORAL_TABLET | Freq: Four times a day (QID) | ORAL | Status: DC | PRN

## 2019-07-27 MED ORDER — SODIUM CHLORIDE 0.9 % IV SOLN
INTRAVENOUS | Status: AC
  Administered 2019-07-27 – 2019-07-28 (×3): via INTRAVENOUS

## 2019-07-27 MED ORDER — PANTOPRAZOLE SODIUM 40 MG PO TBEC
40.0000 mg | DELAYED_RELEASE_TABLET | Freq: Every day | ORAL | Status: DC
  Administered 2019-07-28 – 2019-07-31 (×4): 40 mg via ORAL
  Filled 2019-07-27 (×4): qty 1

## 2019-07-27 MED ORDER — ALBUTEROL SULFATE HFA 108 (90 BASE) MCG/ACT IN AERS
2.0000 | INHALATION_SPRAY | Freq: Once | RESPIRATORY_TRACT | Status: AC
  Administered 2019-07-27: 16:00:00 2 via RESPIRATORY_TRACT
  Filled 2019-07-27: qty 6.7

## 2019-07-27 MED ORDER — INSULIN GLARGINE 100 UNIT/ML ~~LOC~~ SOLN
15.0000 [IU] | Freq: Every day | SUBCUTANEOUS | Status: DC
  Administered 2019-07-27: 15 [IU] via SUBCUTANEOUS
  Filled 2019-07-27 (×3): qty 0.15

## 2019-07-27 MED ORDER — CYCLOBENZAPRINE HCL 10 MG PO TABS: 10.0000 mg | ORAL_TABLET | Freq: Three times a day (TID) | ORAL | Status: DC | PRN

## 2019-07-27 MED ORDER — IOHEXOL 300 MG/ML  SOLN
100.0000 mL | Freq: Once | INTRAMUSCULAR | Status: AC | PRN
  Administered 2019-07-27: 100 mL via INTRAVENOUS

## 2019-07-27 MED ORDER — FREE WATER
100.0000 mL | Freq: Three times a day (TID) | Status: DC
  Administered 2019-07-27 – 2019-07-28 (×3): 100 mL

## 2019-07-27 MED ORDER — GLUCERNA 1.2 CAL PO LIQD
480.0000 mL | Freq: Three times a day (TID) | ORAL | Status: DC
  Administered 2019-07-27: 480 mL
  Administered 2019-07-28 (×2): 474 mL
  Administered 2019-07-28 – 2019-07-31 (×8): 480 mL

## 2019-07-27 MED ORDER — FENTANYL CITRATE (PF) 100 MCG/2ML IJ SOLN
50.0000 ug | Freq: Once | INTRAMUSCULAR | Status: AC
  Administered 2019-07-27: 50 ug via INTRAVENOUS
  Filled 2019-07-27: qty 2

## 2019-07-27 MED ORDER — INSULIN ASPART 100 UNIT/ML ~~LOC~~ SOLN
0.0000 [IU] | Freq: Three times a day (TID) | SUBCUTANEOUS | Status: DC
  Administered 2019-07-27: 3 [IU] via SUBCUTANEOUS
  Administered 2019-07-28: 5 [IU] via SUBCUTANEOUS
  Administered 2019-07-28: 17:00:00 11 [IU] via SUBCUTANEOUS
  Administered 2019-07-28 – 2019-07-29 (×2): 8 [IU] via SUBCUTANEOUS
  Administered 2019-07-29: 10:00:00 3 [IU] via SUBCUTANEOUS

## 2019-07-27 MED ORDER — PHENAZOPYRIDINE HCL 100 MG PO TABS: 200.0000 mg | ORAL_TABLET | Freq: Three times a day (TID) | ORAL | Status: DC | PRN

## 2019-07-27 MED ORDER — INSULIN ASPART 100 UNIT/ML ~~LOC~~ SOLN
0.0000 [IU] | Freq: Every day | SUBCUTANEOUS | Status: DC
  Administered 2019-07-28: 3 [IU] via SUBCUTANEOUS

## 2019-07-27 MED ORDER — ENOXAPARIN SODIUM 40 MG/0.4ML ~~LOC~~ SOLN
40.0000 mg | SUBCUTANEOUS | Status: DC
  Administered 2019-07-27 – 2019-07-30 (×4): 40 mg via SUBCUTANEOUS
  Filled 2019-07-27 (×4): qty 0.4

## 2019-07-27 MED ORDER — GLUCERNA 1.5 CAL PO LIQD: 1000.0000 mL | Freq: Three times a day (TID) | ORAL | Status: DC

## 2019-07-27 MED ORDER — INSULIN ASPART 100 UNIT/ML IV SOLN
5.0000 [IU] | Freq: Once | INTRAVENOUS | Status: AC
  Administered 2019-07-27: 5 [IU] via INTRAVENOUS

## 2019-07-27 MED ORDER — CALCIUM GLUCONATE-NACL 1-0.675 GM/50ML-% IV SOLN
1.0000 g | Freq: Once | INTRAVENOUS | Status: AC
  Administered 2019-07-27: 1000 mg via INTRAVENOUS
  Filled 2019-07-27: qty 50

## 2019-07-27 NOTE — ED Notes (Signed)
Pt gone to CT 

## 2019-07-27 NOTE — ED Notes (Signed)
Report given to 300 RN

## 2019-07-27 NOTE — ED Notes (Signed)
ambulated patient around his room and he tolerated it well. Ox stayed at 96%

## 2019-07-27 NOTE — ED Notes (Signed)
Pt reports that he eats very little, most of his nutrition is through the feeding tube,

## 2019-07-27 NOTE — ED Notes (Signed)
Rip Harbour RN in with pt at this time doing an admission assessment

## 2019-07-27 NOTE — ED Provider Notes (Signed)
[facility] EMERGENCY DEPARTMENT Provider Note   CSN: [id number] Arrival date & time: 07/27/19  1108     History   Chief Complaint Chief Complaint  Patient presents with  . Abdominal Pain    HPI Colin Wagner is a 68 y.o. male.  Presents to the emergency department with chief complaint of abdominal pain.  Patient states he has been having abdominal, right flank pain for the past 3 days, pain seems to come and go at random, no alleviating or aggravating factors.  Sharp, stabbing pain.  Noted small amount of blood in his urine but no dysuria.  No fevers at home.  Has had generalized fatigue.  Has had mild nonproductive cough.  No difficulty breathing.  No chest pain.     HPI  Past Medical History:  Diagnosis Date  . Alcohol abuse   . Alcoholic pancreatitis 4650   admission  . Chronic back pain   . Chronic pancreatitis ([place])    based on ct findings 2016  . COPD (chronic obstructive pulmonary disease) ([place])   . Diabetes mellitus    type 2  . Diverticulosis   . Gastritis   . GERD (gastroesophageal reflux disease)   . Headache   . History of radiation therapy 05/12/17- 06/22/17   Left cheek and bilateral neck/ 60 Gy in 30 fractions to gross disease  . Hyperlipidemia   . Hypertension   . 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 Peptic ulcer disease 1999   Per medical reports, no H pylori  . Pneumonia   . Renal cancer, left ([place]) 2012   he tells me that he has been released, ?and that he is free of cancer, and never had it to begin with.   . Right shoulder pain   . Testicular hypofunction     Patient Active Problem List   Diagnosis Date Noted  . Urinary retention due to benign prostatic hyperplasia 03/01/2019  . Attention to G-tube ([place]) 02/08/2018  . UTI (urinary tract infection) 04/20/2017  . UTI due to Klebsiella species 04/20/2017  . Essential hypertension 04/20/2017  . Chronic pancreatitis ([place]) 04/20/2017  . 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 Malnutrition of moderate degree 04/20/2017  . Complicated UTI (urinary tract infection)   . Carcinoma of buccal mucosa ([place]) 01/05/2017  . Rectal bleeding 09/28/2016  . Hyponatremia 04/28/2015  . Abdominal pain 04/27/2015  . Acute pancreatitis 04/27/2015  . 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 ETOH abuse 04/27/2015  . DM type 2 (diabetes mellitus, type 2) ([place]) 04/27/2015  . Hypertension, uncontrolled 04/27/2015  . Hyperlipidemia 04/27/2015  . Hypertensive urgency 04/27/2015  . Alcohol abuse     Past Surgical History:  Procedure Laterality Date  . COLONOSCOPY  2003   Dr. [name], polyps  . COLONOSCOPY  2005   Dr. [name], multiple diverticula  . COLONOSCOPY  2008   Dr. [name], diverticulosis  . COLONOSCOPY WITH PROPOFOL N/A 10/21/2016   Dr. [name]: Diverticulosis, two 5-7 mm polyps removed. path-tubular adenomas.  Next colonoscopy in 5 years.  . ESOPHAGOGASTRODUODENOSCOPY     Multiple EGDs. 1999 EGD showed gastric ulcers, no H pylori and benign biopsies performed by Dr. [name]. 2001 gastric ulcer healed. Last EGD 2005 had gastritis.  . IR REPLACE G-TUBE SIMPLE WO FLUORO  03/20/2019  . IR REPLC GASTRO/COLONIC TUBE PERCUT W/FLUORO  05/16/2018  . Left partial mandibulectomy, Scapular free flap reconstruction, selective neck dissection,  tracheotomy, and resection of intraoral palate cancer. Left 03/01/2017   [facility]  . LUNG BIOPSY    . MOUTH SURGERY    . PARTIAL NEPHRECTOMY Left 2012  . POLYPECTOMY  10/21/2016   Procedure: POLYPECTOMY;  Surgeon: [name], MD;  Location: [facility] ENDO SUITE;  Service: Endoscopy;;  hepatic flexure x2  . TRANSURETHRAL RESECTION OF PROSTATE N/A 03/01/2019   Procedure: TRANSURETHRAL RESECTION OF THE PROSTATE (TURP)WITH CYSTOSCOPY;  Surgeon: Raynelle Bring, MD;  Location: [facility] ORS;  Service: Urology;  Laterality: N/A;        Home Medications    Prior to Admission  medications   Medication Sig Start Date End Date Taking? Authorizing Provider  aspirin EC 81 MG tablet Take 81 mg by mouth daily.    [provider]  atorvastatin (LIPITOR) 40 MG tablet Take 40 mg by mouth daily at 6 PM.  02/14/17   [provider]  cetirizine (ZYRTEC) 10 MG tablet Take 10 mg by mouth daily.    [provider]  cyclobenzaprine (FLEXERIL) 10 MG tablet Take 10 mg by mouth 2 (two) times a day.  09/06/18   [provider]  fluticasone (FLONASE) 50 MCG/ACT nasal spray Place 2 sprays into both nostrils daily.  08/04/18   [provider]  glipiZIDE (GLUCOTROL XL) 2.5 MG 24 hr tablet Take 2.5 mg by mouth every morning. 08/11/18   [provider]  HYDROcodone-acetaminophen (NORCO) 7.5-325 MG tablet Take 1 tablet by mouth every 6 (six) hours as needed (pain). Patient taking differently: Take 1 tablet by mouth every 6 (six) hours as needed for moderate pain.  01/12/17   [name], MD  lidocaine (XYLOCAINE) 2 % solution Patient: Mix 1part 2% viscous lidocaine, 1part H20. Swish &/or swallow 22mL of diluted mixture, 20min before meals and at bedtime, up to QID Patient taking differently: Use as directed 5 mLs in the mouth or throat 3 (three) times daily as needed for mouth pain. Patient: Mix 1part 2% viscous lidocaine, 1part H20. Swish &/or swallow. 04/14/18   [name], MD  lisinopril (PRINIVIL,ZESTRIL) 5 MG tablet Take 5 mg by mouth daily.  04/06/17   [provider]  LORazepam (ATIVAN) 1 MG tablet Take 1 mg by mouth 4 (four) times daily as needed for anxiety.  03/31/15   [provider]  metFORMIN (GLUCOPHAGE-XR) 500 MG 24 hr tablet Take 500-1,000 mg by mouth See admin instructions. Take 1000 mg by mouth in the morning, take 500 mg by mouth in the afternoon and 500 mg by mouth in the evening 12/27/17   [provider]  Nutritional Supplements (GLUCERNA 1.5 CAL PO) 480 mLs by PEG Tube route 3 (three) times daily. 120  ml water before 243ml water after    [provider]  pantoprazole (PROTONIX) 40 MG tablet Take 40 mg by mouth daily.     [provider]  phenazopyridine (PYRIDIUM) 200 MG tablet Take 1 tablet (200 mg total) by mouth 3 (three) times daily as needed for pain (burning with urination). 03/01/19   Raynelle Bring, MD  senna (SENOKOT) 8.6 MG TABS tablet Take 1 tablet (8.6 mg total) by mouth at bedtime as needed for mild constipation. May be needed as pain medication can constipate. 01/12/17   [name], MD  traZODone (DESYREL) 50 MG tablet Take 100 mg by mouth at bedtime.     [provider]  Water For Irrigation, Sterile (FREE WATER) SOLN Place 100 mLs into feeding tube every  8 (eight) hours. 04/22/17   Orson Eva, MD    Family History Family History  Problem Relation Age of Onset  . Hypertension Mother   . Colon cancer Neg Hx     Social History Social History   Tobacco Use  . Smoking status: Former Smoker    Packs/day: 0.50    Years: 35.00    Pack years: 17.50  . Smokeless tobacco: Never Used  . Tobacco comment: quit 02-2017  Substance Use Topics  . Alcohol use: No    Comment: quit 2000 but relapse in 2016. no etoh since hospitalized 2016.   . Drug use: No     Allergies   Patient has no known allergies.   Review of Systems Review of Systems  Constitutional: Negative for chills and fever.  HENT: Negative for ear pain and sore throat.   Eyes: Negative for pain and visual disturbance.  Respiratory: Positive for cough. Negative for shortness of breath.   Cardiovascular: Negative for chest pain and palpitations.  Gastrointestinal: Positive for abdominal pain. Negative for vomiting.  Genitourinary: Positive for hematuria. Negative for dysuria.  Musculoskeletal: Negative for arthralgias and back pain.  Skin: Negative for color change and rash.  Neurological: Negative for seizures and syncope.  All other systems reviewed and are negative.    Physical  Exam Updated Vital Signs Ht 5\' 11"  (1.803 m)   Wt 83 kg   BMI 25.52 kg/m   Physical Exam Vitals signs and nursing note reviewed.  Constitutional:      Appearance: He is well-developed.  HENT:     Head: Normocephalic and atraumatic.  Eyes:     Conjunctiva/sclera: Conjunctivae normal.  Neck:     Musculoskeletal: Neck supple.  Cardiovascular:     Rate and Rhythm: Normal rate and regular rhythm.     Heart sounds: No murmur.  Pulmonary:     Effort: Pulmonary effort is normal. No respiratory distress.     Breath sounds: Normal breath sounds.  Abdominal:     Palpations: Abdomen is soft.     Tenderness: There is abdominal tenderness in the right lower quadrant. There is no right CVA tenderness.  Skin:    General: Skin is warm and dry.  Neurological:     Mental Status: He is alert.      ED Treatments / Results  Labs (all labs ordered are listed, but only abnormal results are displayed) Labs Reviewed  CBC WITH DIFFERENTIAL/PLATELET - Abnormal; Notable for the following components:      Result Value   Hemoglobin 11.9 (*)    HCT 38.6 (*)    MCH 25.0 (*)    RDW 17.4 (*)    Platelets 149 (*)    Lymphs Abs 0.4 (*)    All other components within normal limits  COMPREHENSIVE METABOLIC PANEL - Abnormal; Notable for the following components:   Sodium 131 (*)    Potassium 5.2 (*)    Chloride 94 (*)    Glucose, Bld 317 (*)    BUN 43 (*)    Creatinine, Ser 1.54 (*)    Calcium 8.4 (*)    Albumin 3.1 (*)    GFR calc non Af Amer 45 (*)    GFR calc Af Amer 53 (*)    All other components within normal limits  POC SARS CORONAVIRUS 2 AG -  ED - Abnormal; Notable for the following components:   SARS Coronavirus 2 Ag POSITIVE (*)    All other components within normal limits  LIPASE, BLOOD  URINALYSIS, ROUTINE W REFLEX MICROSCOPIC  TROPONIN I (HIGH SENSITIVITY)    EKG EKG Interpretation  Date/Time:  Friday July 27 2019 12:21:57 EST Ventricular Rate:  93 PR Interval:     QRS Duration: 73 QT Interval:  348 QTC Calculation: 433 R Axis:   87 Text Interpretation: Sinus rhythm Borderline right axis deviation Nonspecific T abnormalities, lateral leads no acute STEMI Confirmed by Madalyn Rob [phone]) on 07/27/2019 1:06:09 PM   Radiology Dg Chest Portable 1 View  Result Date: 07/27/2019 CLINICAL DATA:  Shortness of breath with abdominal pain 3 days and hematuria. Productive cough. COVID. EXAM: PORTABLE CHEST 1 VIEW COMPARISON:  09/25/2016 FINDINGS: Lungs are adequately inflated demonstrate subtle patchy density over the perihilar regions of the mid to lower lungs which may be due to infection. No effusion. Cardiomediastinal silhouette and remainder of the exam is unchanged. IMPRESSION: Subtle patchy density over the perihilar regions of the mid to lower lungs which may be due to infection. Electronically Signed   By: Marin Olp M.D.   On: 07/27/2019 12:19    Procedures Procedures (including critical care time)  Medications Ordered in ED Medications  fentaNYL (SUBLIMAZE) injection 50 mcg (50 mcg Intravenous Given 07/27/19 1222)     Initial Impression / Assessment and Plan / ED Course  I have reviewed the triage vital signs and the nursing notes.  Pertinent labs & imaging results that were available during my care of the patient were reviewed by me and considered in my medical decision making (see chart for details).        68 year old male presented to ER with chief complaint of abdominal pain, generalized weakness.  On exam patient had normal vital signs, well-appearing without distress.  Work-up concerning for new COVID-19 diagnosis.  At times borderline low O2 on room air but did okay on ambulation trial.  Chest x-ray with questionable infiltrate consistent with COVID-19 diagnosis.  Labs concerning for AKI with hyperkalemia.  His EKG showed some increased T waves in inferior and anterior leads.  No dynamic changes, no chest pain, troponin within normal  limits.  Low suspicion that this is related to ACS.  More likely related to AKI, hyperkalemia.  Gave patient insulin, albuterol, calcium.  Started gentle hydration.  Discussed case with hospitalist service who will admit for observation and further management.   Final Clinical Impressions(s) / ED Diagnoses   Final diagnoses:  COVID-19  Hyperkalemia  Acute kidney injury [facility])    ED Discharge Orders    None       Lucrezia Starch, MD 07/27/19 1553

## 2019-07-27 NOTE — H&P (Signed)
History and Physical  Colin Wagner BTD:[id number] DOB: March 06, 1951 DOA: 07/27/2019  Referring physician: Dr Roslynn Amble, ED physician PCP: [name], MD  Outpatient Specialists:   Patient Coming From: home  Chief Complaint: abdominal pain  HPI: Colin Wagner is a 68 y.o. 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  Patient presents with some abdominal pain.  Pain is mostly resolved in the ER, however in work-up he has had elevated creatinine, elevated potassium, and peaked T waves.  Patient given albuterol, insulin, and calcium gluconate.  Emergency Department Course: Nonspecific T wave elevation on EKG.  Creatinine elevated.    Review of Systems:   Pt denies any fevers, chills, nausea, vomiting, diarrhea, constipation, abdominal pain, shortness of breath, dyspnea on exertion, orthopnea, cough, wheezing, palpitations, headache, vision changes, lightheadedness, dizziness, melena, rectal bleeding.  Review of systems are otherwise negative  Past Medical History:  Diagnosis Date   Alcohol abuse    Alcoholic pancreatitis 1062   admission   Chronic back pain    Chronic pancreatitis ([place])    based on ct findings 2016   COPD (chronic obstructive pulmonary disease) ([place])    Diabetes mellitus    type 2   Diverticulosis    Gastritis    GERD (gastroesophageal reflux disease)    Headache    History of radiation therapy 05/12/17- 06/22/17   Left cheek and bilateral neck/ 60 Gy in 30 fractions to gross disease   Hyperlipidemia    Hypertension    Jaw cancer ([place])    left jaw part of jaw bone removed   Peptic ulcer disease 1999   Per medical reports, no H pylori   Pneumonia    Renal cancer, left ([place]) 2012   he tells me that he has been released, ?and that he is free of cancer, and never had it to begin with.     Right shoulder pain    Testicular hypofunction    Past Surgical History:  Procedure Laterality Date   COLONOSCOPY  2003   Dr. [name], polyps   COLONOSCOPY  2005   Dr. [name], multiple diverticula   COLONOSCOPY  2008   Dr. [name], diverticulosis   COLONOSCOPY WITH PROPOFOL N/A 10/21/2016   Dr. [name]: Diverticulosis, two 5-7 mm polyps removed. path-tubular adenomas.  Next colonoscopy in 5 years.   ESOPHAGOGASTRODUODENOSCOPY     Multiple EGDs. 1999 EGD showed gastric ulcers, no H pylori and benign biopsies performed by Dr. [name]. 2001 gastric ulcer healed. Last EGD 2005 had gastritis.   IR REPLACE G-TUBE SIMPLE WO FLUORO  03/20/2019   IR REPLC GASTRO/COLONIC TUBE PERCUT W/FLUORO  05/16/2018   Left partial mandibulectomy, Scapular free flap reconstruction, selective neck dissection, tracheotomy, and resection of intraoral palate cancer. Left 03/01/2017   [facility]   LUNG BIOPSY     MOUTH SURGERY     PARTIAL NEPHRECTOMY Left 2012   POLYPECTOMY  10/21/2016   Procedure: POLYPECTOMY;  Surgeon: [name], MD;  Location: [facility];  Service: Endoscopy;;  hepatic flexure x2   TRANSURETHRAL RESECTION OF PROSTATE N/A 03/01/2019   Procedure: TRANSURETHRAL RESECTION OF THE PROSTATE (TURP)WITH CYSTOSCOPY;  Surgeon: Raynelle Bring, MD;  Location: [facility] ORS;  Service: Urology;  Laterality: N/A;   Social History:  reports that he has quit smoking. He has a 17.50 pack-year smoking history. He has  never used smokeless tobacco. He reports that he does not drink alcohol or use drugs. Patient lives at home  No Known Allergies  Family History  Problem Relation Age of Onset   Hypertension Mother    Colon cancer Neg Hx       Prior to Admission medications   Medication Sig Start Date End Date Taking? Authorizing Provider  amoxicillin (AMOXIL) 500 MG capsule Take 500 mg by mouth 3 (three) times daily. 07/25/19  Yes [provider]  aspirin  EC 81 MG tablet Take 81 mg by mouth daily.   Yes [provider]  atorvastatin (LIPITOR) 40 MG tablet Take 40 mg by mouth daily at 6 PM.  02/14/17  Yes [provider]  celecoxib (CELEBREX) 100 MG capsule Take 100 mg by mouth 2 (two) times daily. 07/20/19  Yes [provider]  cetirizine (ZYRTEC) 10 MG tablet Take 10 mg by mouth daily.   Yes [provider]  clopidogrel (PLAVIX) 75 MG tablet Take 75 mg by mouth daily. 05/27/19  Yes [provider]  cyclobenzaprine (FLEXERIL) 10 MG tablet Take 10 mg by mouth 2 (two) times a day.  09/06/18  Yes [provider]  fluticasone (FLONASE) 50 MCG/ACT nasal spray Place 2 sprays into both nostrils daily.  08/04/18  Yes [provider]  glipiZIDE (GLUCOTROL XL) 2.5 MG 24 hr tablet Take 2.5 mg by mouth every morning. 08/11/18  Yes [provider]  hydrochlorothiazide (HYDRODIURIL) 12.5 MG tablet Take 12.5 mg by mouth daily. 04/24/19  Yes [provider]  HYDROcodone-acetaminophen (NORCO) 7.5-325 MG tablet Take 1 tablet by mouth every 6 (six) hours as needed (pain). Patient taking differently: Take 1 tablet by mouth every 6 (six) hours as needed for moderate pain.  01/12/17  Yes [name], MD  LEVEMIR FLEXTOUCH 100 UNIT/ML Pen Inject 22 Units into the skin Nightly. 07/17/19  Yes [provider]  lidocaine (XYLOCAINE) 2 % solution Patient: Mix 1part 2% viscous lidocaine, 1part H20. Swish &/or swallow 85mL of diluted mixture, 25min before meals and at bedtime, up to QID Patient taking differently: Use as directed 5 mLs in the mouth or throat 3 (three) times daily as needed for mouth pain. Patient: Mix 1part 2% viscous lidocaine, 1part H20. Swish &/or swallow. 04/14/18  Yes [name], MD  lisinopril (PRINIVIL,ZESTRIL) 5 MG tablet Take 5 mg by mouth daily.  04/06/17  Yes [provider]  LORazepam (ATIVAN) 1 MG tablet Take 1 mg by mouth 4 (four) times daily as needed for  anxiety.  03/31/15  Yes [provider]  metFORMIN (GLUCOPHAGE-XR) 500 MG 24 hr tablet Take 500-1,000 mg by mouth See admin instructions. Take 1000 mg by mouth in the morning, take 500 mg by mouth in the afternoon and 500 mg by mouth in the evening 12/27/17  Yes [provider]  Nutritional Supplements (GLUCERNA 1.5 CAL PO) 480 mLs by PEG Tube route 3 (three) times daily. 120 ml water before 248ml water after   Yes [provider]  pantoprazole (PROTONIX) 40 MG tablet Take 40 mg by mouth daily.    Yes [provider]  phenazopyridine (PYRIDIUM) 200 MG tablet Take 1 tablet (200 mg total) by mouth 3 (three) times daily as needed for pain (burning with urination). 03/01/19  Yes Raynelle Bring, MD  senna (SENOKOT) 8.6 MG TABS tablet Take 1 tablet (8.6 mg total) by mouth at bedtime as needed for mild constipation. May be needed as pain medication can constipate. 01/12/17  Yes [name], MD  traZODone (DESYREL) 50 MG tablet Take 100 mg by mouth at bedtime.    Yes [provider]  Water For Irrigation, Sterile (FREE WATER) SOLN Place 100 mLs into feeding tube every 8 (eight) hours. 04/22/17  Yes TatShanon Brow, MD    Physical Exam: BP 128/69    Pulse 91    Temp 98.5 F (36.9 C) (Oral)    Resp 14    Ht 5\' 11"  (1.803 m)    Wt 83 kg    SpO2 96%    BMI 25.52 kg/m    General: Elderly male. Awake and alert and oriented x3. No acute cardiopulmonary distress.   HEENT: Normocephalic atraumatic.  Right and left ears normal in appearance.  Pupils equal, round, reactive to light. Extraocular muscles are intact. Sclerae anicteric and noninjected.  Moist mucosal membranes. No mucosal lesions.   Neck: Neck supple without lymphadenopathy. No carotid bruits. No masses palpated.   Cardiovascular: Regular rate with normal S1-S2 sounds. No murmurs, rubs, gallops auscultated. No JVD.   Respiratory: Good respiratory effort with no wheezes, rales, rhonchi. Lungs clear to auscultation  bilaterally.  No accessory muscle use.  Abdomen: Soft, nontender, nondistended. Active bowel sounds. No masses or hepatosplenomegaly   Skin: No rashes, lesions, or ulcerations.  Dry, warm to touch. 2+ dorsalis pedis and radial pulses.  Musculoskeletal: No calf or leg pain. All major joints not erythematous nontender.  No upper or lower joint deformation.  Good ROM.  No contractures   Psychiatric: Intact judgment and insight. Pleasant and cooperative.  Neurologic: No focal neurological deficits. Strength is 5/5 and symmetric in upper and lower extremities.  Cranial nerves II through XII are grossly intact.           Labs on Admission: I have personally reviewed following labs and imaging studies  CBC: Recent Labs  Lab 07/27/19 1126  WBC 5.9  NEUTROABS 5.0  HGB 11.9*  HCT 38.6*  MCV 81.1  PLT 740*   Basic Metabolic Panel: Recent Labs  Lab 07/27/19 1126  NA 131*  K 5.2*  CL 94*  CO2 24  GLUCOSE 317*  BUN 43*  CREATININE 1.54*  CALCIUM 8.4*   GFR: Estimated Creatinine Clearance: 48.2 mL/min (A) (by C-G formula based on SCr of 1.54 mg/dL (H)). Liver Function Tests: Recent Labs  Lab 07/27/19 1126  AST 25  ALT 18  ALKPHOS 94  BILITOT 0.3  PROT 6.8  ALBUMIN 3.1*   Recent Labs  Lab 07/27/19 1126  LIPASE 22   No results for input(s): AMMONIA in the last 168 hours. Coagulation Profile: No results for input(s): INR, PROTIME in the last 168 hours. Cardiac Enzymes: No results for input(s): CKTOTAL, CKMB, CKMBINDEX, TROPONINI in the last 168 hours. BNP (last 3 results) No results for input(s): PROBNP in the last 8760 hours. HbA1C: No results for input(s): HGBA1C in the last 72 hours. CBG: Recent Labs  Lab 07/27/19 1630  GLUCAP 253*   Lipid Profile: No results for input(s): CHOL, HDL, LDLCALC, TRIG, CHOLHDL, LDLDIRECT in the last 72 hours. Thyroid Function Tests: No results for input(s): TSH, T4TOTAL, FREET4, T3FREE, THYROIDAB in the last 72 hours. Anemia  Panel: No results for input(s): VITAMINB12, FOLATE, FERRITIN, TIBC, IRON, RETICCTPCT in the last 72 hours. Urine analysis:    Component Value Date/Time   COLORURINE YELLOW 07/27/2019 1201   APPEARANCEUR HAZY (A) 07/27/2019 1201   LABSPEC 1.012 07/27/2019 1201   PHURINE 5.0 07/27/2019 1201   GLUCOSEU >=500 (A)  07/27/2019 1201   HGBUR LARGE (A) 07/27/2019 1201   [place] 07/27/2019 1201   KETONESUR NEGATIVE 07/27/2019 1201   PROTEINUR 30 (A) 07/27/2019 1201   UROBILINOGEN 0.2 04/27/2015 1916   NITRITE NEGATIVE 07/27/2019 1201   LEUKOCYTESUR TRACE (A) 07/27/2019 1201   Sepsis Labs: @LABRCNTIP (procalcitonin:4,lacticidven:4) )No results found for this or any previous visit (from the past 240 hour(s)).   Radiological Exams on Admission: Ct Abdomen Pelvis W Contrast  Result Date: 07/27/2019 CLINICAL DATA:  Right lower quadrant abdomen pain. Assess for appendicitis. EXAM: CT ABDOMEN AND PELVIS WITH CONTRAST TECHNIQUE: Multidetector CT imaging of the abdomen and pelvis was performed using the standard protocol following bolus administration of intravenous contrast. CONTRAST:  160mL OMNIPAQUE IOHEXOL 300 MG/ML  SOLN COMPARISON:  April 19, 2017 FINDINGS: Lower chest: Mild atelectasis of right lung base is identified. Hepatobiliary: Generalized low density of the liver is identified without focal liver lesion. The gallbladder is normal. The biliary tree is normal. Pancreas: Calcification of the pancreatic tail and pancreatic head is unchanged compared prior exam. The pancreatic duct measures 4 mm, unchanged. Spleen: Normal in size without focal abnormality. Adrenals/Urinary Tract: The bilateral adrenal glands are normal. Simple cysts are identified in both kidneys. There is no hydronephrosis bilaterally. The bladder is normal. Stomach/Bowel: The patient tube is identified. The stomach is otherwise normal. There is no small bowel obstruction. The colon is normal. The appendix is normal.  Vascular/Lymphatic: Aortic atherosclerosis. No enlarged abdominal or pelvic lymph nodes. Reproductive: Prostate gland measures 5.2 cm in diameter. Other: None. Musculoskeletal: Degenerative joint changes of the spine are noted. IMPRESSION: Normal appendix.  No bowel obstruction. Chronic pancreatitis. Bilateral kidney cysts. Electronically Signed   By: Abelardo Diesel M.D.   On: 07/27/2019 15:03   Dg Chest Portable 1 View  Result Date: 07/27/2019 CLINICAL DATA:  Shortness of breath with abdominal pain 3 days and hematuria. Productive cough. COVID. EXAM: PORTABLE CHEST 1 VIEW COMPARISON:  09/25/2016 FINDINGS: Lungs are adequately inflated demonstrate subtle patchy density over the perihilar regions of the mid to lower lungs which may be due to infection. No effusion. Cardiomediastinal silhouette and remainder of the exam is unchanged. IMPRESSION: Subtle patchy density over the perihilar regions of the mid to lower lungs which may be due to infection. Electronically Signed   By: Marin Olp M.D.   On: 07/27/2019 12:19    EKG: Independently reviewed.  Sinus rhythm.  Borderline right axis deviation.  Nonspecific T wave elevation.  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  We also discussed care given by the ED provider.  1. Acute kidney injury a. Gentle fluids overnight b. Observation c. Appears that the patient has some cognitive delays and instructions of holding medications may be difficult.  We will hold lisinopril for now. d. Recheck creatinine and potassium tomorrow 2. Hyperkalemia a. Minimally  elevated. b. Recheck creatinine in the morning 3. Diabetes type 2 a. Hold oral hypoglycemics b. Increase Levemir by 10 units to 32 units at night c. CBGs d. Sliding scale insulin 4. Hypertension a. Controlled 5. Covid positive a. No respiratory symptoms currently b. Precautions 6.  Chronic pancreatitis a. Likely etiology of patient's abdominal pain  DVT prophylaxis: Lovenox Consultants: None Code Status: Full code Family Communication: None Disposition Plan: Likely discharged home tomorrow   Truett Mainland, DO

## 2019-07-27 NOTE — ED Triage Notes (Signed)
Pt brought in by [facility] for c/o abdominal pain and flank pain x 3 days; pt has some blood in urine; pt is taking an antibiotic for a yeast infection; cbg 346; pt c/o productive cough

## 2019-07-28 DIAGNOSIS — K860 Alcohol-induced chronic pancreatitis: Secondary | ICD-10-CM | POA: Diagnosis present

## 2019-07-28 DIAGNOSIS — N179 Acute kidney failure, unspecified: Secondary | ICD-10-CM | POA: Diagnosis present

## 2019-07-28 DIAGNOSIS — J9601 Acute respiratory failure with hypoxia: Secondary | ICD-10-CM | POA: Diagnosis present

## 2019-07-28 DIAGNOSIS — Z79899 Other long term (current) drug therapy: Secondary | ICD-10-CM | POA: Diagnosis not present

## 2019-07-28 DIAGNOSIS — Z87891 Personal history of nicotine dependence: Secondary | ICD-10-CM | POA: Diagnosis not present

## 2019-07-28 DIAGNOSIS — Z7982 Long term (current) use of aspirin: Secondary | ICD-10-CM | POA: Diagnosis not present

## 2019-07-28 DIAGNOSIS — Z85528 Personal history of other malignant neoplasm of kidney: Secondary | ICD-10-CM | POA: Diagnosis not present

## 2019-07-28 DIAGNOSIS — K219 Gastro-esophageal reflux disease without esophagitis: Secondary | ICD-10-CM | POA: Diagnosis present

## 2019-07-28 DIAGNOSIS — Z905 Acquired absence of kidney: Secondary | ICD-10-CM | POA: Diagnosis not present

## 2019-07-28 DIAGNOSIS — I129 Hypertensive chronic kidney disease with stage 1 through stage 4 chronic kidney disease, or unspecified chronic kidney disease: Secondary | ICD-10-CM | POA: Diagnosis present

## 2019-07-28 DIAGNOSIS — Z85818 Personal history of malignant neoplasm of other sites of lip, oral cavity, and pharynx: Secondary | ICD-10-CM | POA: Diagnosis not present

## 2019-07-28 DIAGNOSIS — E860 Dehydration: Secondary | ICD-10-CM | POA: Diagnosis present

## 2019-07-28 DIAGNOSIS — E875 Hyperkalemia: Secondary | ICD-10-CM | POA: Diagnosis present

## 2019-07-28 DIAGNOSIS — E1122 Type 2 diabetes mellitus with diabetic chronic kidney disease: Secondary | ICD-10-CM | POA: Diagnosis present

## 2019-07-28 DIAGNOSIS — K861 Other chronic pancreatitis: Secondary | ICD-10-CM | POA: Diagnosis not present

## 2019-07-28 DIAGNOSIS — E785 Hyperlipidemia, unspecified: Secondary | ICD-10-CM | POA: Diagnosis present

## 2019-07-28 DIAGNOSIS — K5790 Diverticulosis of intestine, part unspecified, without perforation or abscess without bleeding: Secondary | ICD-10-CM | POA: Diagnosis present

## 2019-07-28 DIAGNOSIS — J1282 Pneumonia due to coronavirus disease 2019: Secondary | ICD-10-CM | POA: Diagnosis present

## 2019-07-28 DIAGNOSIS — J440 Chronic obstructive pulmonary disease with acute lower respiratory infection: Secondary | ICD-10-CM | POA: Diagnosis present

## 2019-07-28 DIAGNOSIS — N1831 Chronic kidney disease, stage 3a: Secondary | ICD-10-CM

## 2019-07-28 DIAGNOSIS — Z8711 Personal history of peptic ulcer disease: Secondary | ICD-10-CM | POA: Diagnosis not present

## 2019-07-28 DIAGNOSIS — Z7984 Long term (current) use of oral hypoglycemic drugs: Secondary | ICD-10-CM | POA: Diagnosis not present

## 2019-07-28 DIAGNOSIS — J1289 Other viral pneumonia: Secondary | ICD-10-CM | POA: Diagnosis present

## 2019-07-28 DIAGNOSIS — U071 COVID-19: Secondary | ICD-10-CM | POA: Diagnosis present

## 2019-07-28 DIAGNOSIS — Z8249 Family history of ischemic heart disease and other diseases of the circulatory system: Secondary | ICD-10-CM | POA: Diagnosis not present

## 2019-07-28 DIAGNOSIS — C060 Malignant neoplasm of cheek mucosa: Secondary | ICD-10-CM | POA: Diagnosis present

## 2019-07-28 DIAGNOSIS — E1121 Type 2 diabetes mellitus with diabetic nephropathy: Secondary | ICD-10-CM

## 2019-07-28 DIAGNOSIS — Z923 Personal history of irradiation: Secondary | ICD-10-CM | POA: Diagnosis not present

## 2019-07-28 LAB — BASIC METABOLIC PANEL
Anion gap: 10 (ref 5–15)
BUN: 32 mg/dL — ABNORMAL HIGH (ref 8–23)
CO2: 25 mmol/L (ref 22–32)
Calcium: 8.5 mg/dL — ABNORMAL LOW (ref 8.9–10.3)
Chloride: 99 mmol/L (ref 98–111)
Creatinine, Ser: 1.41 mg/dL — ABNORMAL HIGH (ref 0.61–1.24)
GFR calc Af Amer: 58 mL/min — ABNORMAL LOW (ref >60)
GFR calc non Af Amer: 50 mL/min — ABNORMAL LOW (ref >60)
Glucose, Bld: 250 mg/dL — ABNORMAL HIGH (ref 70–99)
Potassium: 5.1 mmol/L (ref 3.5–5.1)
Sodium: 134 mmol/L — ABNORMAL LOW (ref 135–145)

## 2019-07-28 LAB — GLUCOSE, CAPILLARY
Glucose-Capillary: 294 mg/dL — ABNORMAL HIGH (ref 70–99)
Glucose-Capillary: 330 mg/dL — ABNORMAL HIGH (ref 70–99)
Glucose-Capillary: 252 mg/dL — ABNORMAL HIGH (ref 70–99)

## 2019-07-28 LAB — ABO/RH: ABO/RH(D): A POS

## 2019-07-28 LAB — HIV ANTIBODY (ROUTINE TESTING W REFLEX): HIV Screen 4th Generation wRfx: NONREACTIVE

## 2019-07-28 MED ORDER — SODIUM CHLORIDE 0.9 % IV SOLN
100.0000 mg | Freq: Every day | INTRAVENOUS | Status: DC
  Administered 2019-07-29 – 2019-07-31 (×3): 100 mg via INTRAVENOUS
  Filled 2019-07-28 (×4): qty 20

## 2019-07-28 MED ORDER — SODIUM CHLORIDE 0.9 % IV SOLN
200.0000 mg | Freq: Once | INTRAVENOUS | Status: AC
  Administered 2019-07-28: 200 mg via INTRAVENOUS
  Filled 2019-07-28: qty 40

## 2019-07-28 MED ORDER — INSULIN GLARGINE 100 UNIT/ML ~~LOC~~ SOLN
22.0000 [IU] | Freq: Every day | SUBCUTANEOUS | Status: DC
  Administered 2019-07-28: 22:00:00 22 [IU] via SUBCUTANEOUS
  Filled 2019-07-28 (×2): qty 0.22

## 2019-07-28 MED ORDER — FREE WATER
150.0000 mL | Freq: Three times a day (TID) | Status: DC
  Administered 2019-07-28 – 2019-07-31 (×9): 150 mL

## 2019-07-28 MED ORDER — METHYLPREDNISOLONE SODIUM SUCC 125 MG IJ SOLR
0.5000 mg/kg | Freq: Two times a day (BID) | INTRAMUSCULAR | Status: DC
  Administered 2019-07-28 – 2019-07-31 (×6): 41.25 mg via INTRAVENOUS
  Filled 2019-07-28 (×6): qty 2

## 2019-07-28 NOTE — Progress Notes (Signed)
PROGRESS NOTE    Colin Wagner  BSW:[id number] DOB: [birth year] DOA: 07/27/2019 PCP: [name], MD     Brief Narrative:  68 y.o. 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  Patient presents with some abdominal pain.  Pain is mostly resolved in the ER, however in work-up he has had elevated creatinine, elevated potassium, and peaked T waves.  Patient given albuterol, insulin, and calcium gluconate.  Emergency Department Course: Nonspecific T wave elevation on EKG.  Creatinine elevated.    Assessment & Plan: 1-COVID-19 pneumonia -Chest x-ray demonstrating bilateral infiltrates -Patient with low-grade fever overnight and requiring 2 L nasal cannula supplementation -Will initiate therapy with steroids and remdesivir -Continue supportive care -Continue IV fluids -As needed antipyretics, follow clinical response.  2-acute on chronic renal failure: Stage IIIa at baseline -In the setting of dehydration and continue use of nephrotoxic agents prior to admission. -Continue minimizing/avoiding nephrotoxic agents -Medications adjusted for renal function per pharmacy -Continue IV fluids -Follow renal function trend -Creatinine trending down appropriately.  3-type 2 diabetes mellitus with nephropathy -Continue holding oral hypoglycemic agents while inpatient -A1c 10.2 -Started on sliding scale insulin and Lantus -Follow CBGs and further adjust hypoglycemic regimen as needed -Anticipate increased blood sugar levels with the use of his steroids.  4-Hypertension -Stable overall -Continue current antihypertensive regimen -Heart healthy diet has been ordered.  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 -Continue supportive care. -As needed analgesics will be ordered.  7-Attention to G-tube ([place]) -Continue tube feedings -Free water adjusted to assure proper hydration.  8-Hyperkalemia -Patient received albuterol, insulin and calcium gluconate on presentation -No significant normalities appreciated on telemetry -Potassium down to 5.1 -Continue to monitor electrolytes.  DVT prophylaxis: Lovenox Code Status: Full code Family Communication: No family at bedside. Disposition Plan: Remains inpatient, initiate treatment with steroids and remdesivir; continue oxygen supplementation and wean down to room air as tolerated.  Continue minimizing nephrotoxic agents, continue IV fluids and follow electrolytes trend.  Consultants:   None  Procedures:   See below for x-ray reports.  Antimicrobials:  Anti-infectives (From admission, onward)   Start     Dose/Rate Route Frequency Ordered Stop   07/29/19 1000  remdesivir 100 mg in sodium chloride 0.9 % 100 mL IVPB     100 mg 200 mL/hr over 30 Minutes Intravenous Daily 07/28/19 1355 08/02/19 0959   07/28/19 1400  remdesivir 200 mg in sodium chloride 0.9% 250 mL IVPB     200 mg 580 mL/hr over 30 Minutes Intravenous Once 07/28/19 1355        Subjective: Low-grade temperature overnight; no chest pain, no nausea, no vomiting.  Patient reports feeling short of breath and is now requiring 2 L nasal cannula supplementation.  Feeling weak and tired.  Objective: Vitals:   07/27/19 2140 07/27/19 2300 07/28/19 0540 07/28/19 1350  BP: 129/71  136/66 128/75  Pulse: (!) 110 (!) 102 (!) 103 (!) 103  Resp: 16 16 16 18   Temp: 98.7 F (37.1 C)  99.5 F (37.5 C) 99.8 F (37.7 C)  TempSrc: Oral  Oral Oral  SpO2: (!) 89% (!) 87% 98% 96%  Weight:      Height:        Intake/Output Summary (Last 24 hours) at 07/28/2019  [place] filed at 07/28/2019 1100 Gross per 24 hour  Intake 1061.34 ml  Output 1050 ml    Net 11.34 ml   Filed Weights   07/27/19 1115 07/27/19 1753  Weight: 83 kg 83.1 kg    Examination: General exam: Alert, awake, oriented x 3; no chest pain, no nausea, no vomiting.  Patient reports feeling weak and tired.  Short of breath with minimal exertion and requiring oxygen supplementation overnight.  Low-grade temperature appreciated. Respiratory system: Positive rhonchi bilaterally; no wheezing, no crackles.  No using accessory muscle. Respiratory effort normal. Cardiovascular system: Sinus tachycardia appreciated; no rubs, no gallops, no JVD.   Gastrointestinal system: Abdomen is nondistended, soft and nontender. No organomegaly or masses felt. Normal bowel sounds heard.  PEG tube in place. Central nervous system: Alert and oriented. No focal neurological deficits. Extremities: No cyanosis or clubbing; no edema. Skin: No petechiae, no open wounds appreciated.  PEG tube in place without surrounding erythema or drainage. Psychiatry: Judgement and insight appear normal. Mood & affect appropriate.    Data Reviewed: I have personally reviewed following labs and imaging studies  CBC: Recent Labs  Lab 07/27/19 1126  WBC 5.9  NEUTROABS 5.0  HGB 11.9*  HCT 38.6*  MCV 81.1  PLT 144*   Basic Metabolic Panel: Recent Labs  Lab 07/27/19 1126 07/28/19 0657  NA 131* 134*  K 5.2* 5.1  CL 94* 99  CO2 24 25  GLUCOSE 317* 250*  BUN 43* 32*  CREATININE 1.54* 1.41*  CALCIUM 8.4* 8.5*   GFR: Estimated Creatinine Clearance: 52.7 mL/min (A) (by C-G formula based on SCr of 1.41 mg/dL (H)). Liver Function Tests: Recent Labs  Lab 07/27/19 1126  AST 25  ALT 18  ALKPHOS 94  BILITOT 0.3  PROT 6.8  ALBUMIN 3.1*   Recent Labs  Lab 07/27/19 1126  LIPASE 22   BNP (last 3 results) No results for input(s): PROBNP in the last 8760 hours.   HbA1C: Recent Labs    07/27/19 1126  HGBA1C 10.4*   CBG: Recent Labs  Lab 07/27/19 1630 07/27/19 1820 07/27/19 2137 07/28/19 1125   GLUCAP 253* 190* 190* 294*   Urine analysis:    Component Value Date/Time   COLORURINE YELLOW 07/27/2019 1201   APPEARANCEUR HAZY (A) 07/27/2019 1201   LABSPEC 1.012 07/27/2019 1201   PHURINE 5.0 07/27/2019 1201   GLUCOSEU >=500 (A) 07/27/2019 1201   HGBUR LARGE (A) 07/27/2019 1201   BILIRUBINUR NEGATIVE 07/27/2019 1201   KETONESUR NEGATIVE 07/27/2019 1201   PROTEINUR 30 (A) 07/27/2019 1201   UROBILINOGEN 0.2 04/27/2015 1916   NITRITE NEGATIVE 07/27/2019 1201   LEUKOCYTESUR TRACE (A) 07/27/2019 1201    Radiology Studies: Ct Abdomen Pelvis W Contrast  Result Date: 07/27/2019 CLINICAL DATA:  Right lower quadrant abdomen pain. Assess for appendicitis. EXAM: CT ABDOMEN AND PELVIS WITH CONTRAST TECHNIQUE: Multidetector CT imaging of the abdomen and pelvis was performed using the standard protocol following bolus administration of intravenous contrast. CONTRAST:  13mL OMNIPAQUE IOHEXOL 300 MG/ML  SOLN COMPARISON:  April 19, 2017 FINDINGS: Lower chest: Mild atelectasis of right lung base is identified. Hepatobiliary: Generalized low density of the liver is identified without focal liver lesion. The gallbladder is normal. The biliary tree is normal. Pancreas: Calcification of the pancreatic tail and pancreatic head is unchanged compared prior exam. The pancreatic duct measures 4 mm, unchanged. Spleen: Normal in size without focal abnormality. Adrenals/Urinary Tract: The bilateral adrenal glands are normal. Simple cysts are identified in both  kidneys. There is no hydronephrosis bilaterally. The bladder is normal. Stomach/Bowel: The patient tube is identified. The stomach is otherwise normal. There is no small bowel obstruction. The colon is normal. The appendix is normal. Vascular/Lymphatic: Aortic atherosclerosis. No enlarged abdominal or pelvic lymph nodes. Reproductive: Prostate gland measures 5.2 cm in diameter. Other: None. Musculoskeletal: Degenerative joint changes of the spine are noted.  IMPRESSION: Normal appendix.  No bowel obstruction. Chronic pancreatitis. Bilateral kidney cysts. Electronically Signed   By: Abelardo Diesel M.D.   On: 07/27/2019 15:03   Dg Chest Portable 1 View  Result Date: 07/27/2019 CLINICAL DATA:  Shortness of breath with abdominal pain 3 days and hematuria. Productive cough. COVID. EXAM: PORTABLE CHEST 1 VIEW COMPARISON:  09/25/2016 FINDINGS: Lungs are adequately inflated demonstrate subtle patchy density over the perihilar regions of the mid to lower lungs which may be due to infection. No effusion. Cardiomediastinal silhouette and remainder of the exam is unchanged. IMPRESSION: Subtle patchy density over the perihilar regions of the mid to lower lungs which may be due to infection. Electronically Signed   By: Marin Olp M.D.   On: 07/27/2019 12:19    Scheduled Meds:  aspirin EC  81 mg Oral Daily   atorvastatin  40 mg Oral q1800   enoxaparin (LOVENOX) injection  40 mg Subcutaneous Q24H   feeding supplement (GLUCERNA 1.2 CAL)  480 mL Per Tube TID   free water  150 mL Per Tube Q8H   insulin aspart  0-15 Units Subcutaneous TID WC   insulin aspart  0-5 Units Subcutaneous QHS   insulin glargine  22 Units Subcutaneous QHS   methylPREDNISolone (SOLU-MEDROL) injection  0.5 mg/kg Intravenous Q12H   pantoprazole  40 mg Oral Daily   traZODone  100 mg Oral QHS   Continuous Infusions:  remdesivir 200 mg in sodium chloride 0.9% 250 mL IVPB     Followed by   Derrill Memo ON 07/29/2019] remdesivir 100 mg in NS 100 mL       LOS: 0 days    Time spent: 35 minutes. Greater than 50% of this time was spent in direct contact with the patient, coordinating care and discussing relevant ongoing clinical issues, including low-grade fever, development of shortness of breath and mild hypoxia with requirement of 3 L nasal cannula supplementation.  Patient positive for Covid infection and with chest x-ray demonstrating subtle bilateral infiltrates.  Renal function is  appropriate improving with fluid resuscitation and minimizing nephrotoxic agents.  Potassium down to 5.1.  Patient denies chest pain, nausea or vomiting.  Feeling weak and tired.     Barton Dubois, MD [facility] Pager [phone]   07/28/2019, 1:55 PM

## 2019-07-29 DIAGNOSIS — J1289 Other viral pneumonia: Secondary | ICD-10-CM

## 2019-07-29 DIAGNOSIS — E875 Hyperkalemia: Secondary | ICD-10-CM

## 2019-07-29 DIAGNOSIS — U071 COVID-19: Principal | ICD-10-CM

## 2019-07-29 DIAGNOSIS — C060 Malignant neoplasm of cheek mucosa: Secondary | ICD-10-CM

## 2019-07-29 DIAGNOSIS — K861 Other chronic pancreatitis: Secondary | ICD-10-CM

## 2019-07-29 DIAGNOSIS — J9601 Acute respiratory failure with hypoxia: Secondary | ICD-10-CM

## 2019-07-29 DIAGNOSIS — N179 Acute kidney failure, unspecified: Secondary | ICD-10-CM | POA: Diagnosis not present

## 2019-07-29 DIAGNOSIS — E871 Hypo-osmolality and hyponatremia: Secondary | ICD-10-CM

## 2019-07-29 LAB — CBC WITH DIFFERENTIAL/PLATELET
Abs Immature Granulocytes: 0.01 10*3/uL (ref 0.00–0.07)
Basophils Absolute: 0 10*3/uL (ref 0.0–0.1)
Basophils Relative: 0 %
Eosinophils Absolute: 0 10*3/uL (ref 0.0–0.5)
Eosinophils Relative: 0 %
HCT: 42 % (ref 39.0–52.0)
Hemoglobin: 12.8 g/dL — ABNORMAL LOW (ref 13.0–17.0)
Immature Granulocytes: 0 %
Lymphocytes Relative: 9 %
Lymphs Abs: 0.5 10*3/uL — ABNORMAL LOW (ref 0.7–4.0)
MCH: 24.8 pg — ABNORMAL LOW (ref 26.0–34.0)
MCHC: 30.5 g/dL (ref 30.0–36.0)
MCV: 81.2 fL (ref 80.0–100.0)
Monocytes Absolute: 0.4 10*3/uL (ref 0.1–1.0)
Monocytes Relative: 7 %
Neutro Abs: 5.2 10*3/uL (ref 1.7–7.7)
Neutrophils Relative %: 84 %
Platelets: 156 10*3/uL (ref 150–400)
RBC: 5.17 MIL/uL (ref 4.22–5.81)
RDW: 17 % — ABNORMAL HIGH (ref 11.5–15.5)
WBC: 6.2 10*3/uL (ref 4.0–10.5)
nRBC: 0 % (ref 0.0–0.2)

## 2019-07-29 LAB — COMPREHENSIVE METABOLIC PANEL
ALT: 18 U/L (ref 0–44)
AST: 30 U/L (ref 15–41)
Albumin: 3.2 g/dL — ABNORMAL LOW (ref 3.5–5.0)
Alkaline Phosphatase: 92 U/L (ref 38–126)
Anion gap: 11 (ref 5–15)
BUN: 29 mg/dL — ABNORMAL HIGH (ref 8–23)
CO2: 25 mmol/L (ref 22–32)
Calcium: 8.7 mg/dL — ABNORMAL LOW (ref 8.9–10.3)
Chloride: 97 mmol/L — ABNORMAL LOW (ref 98–111)
Creatinine, Ser: 1.27 mg/dL — ABNORMAL HIGH (ref 0.61–1.24)
GFR calc Af Amer: >60 mL/min (ref >60)
GFR calc non Af Amer: 57 mL/min — ABNORMAL LOW (ref >60)
Glucose, Bld: 202 mg/dL — ABNORMAL HIGH (ref 70–99)
Potassium: 5.4 mmol/L — ABNORMAL HIGH (ref 3.5–5.1)
Sodium: 133 mmol/L — ABNORMAL LOW (ref 135–145)
Total Bilirubin: 0.5 mg/dL (ref 0.3–1.2)
Total Protein: 7.2 g/dL (ref 6.5–8.1)

## 2019-07-29 LAB — GLUCOSE, CAPILLARY
Glucose-Capillary: 199 mg/dL — ABNORMAL HIGH (ref 70–99)
Glucose-Capillary: 300 mg/dL — ABNORMAL HIGH (ref 70–99)
Glucose-Capillary: 426 mg/dL — ABNORMAL HIGH (ref 70–99)
Glucose-Capillary: 328 mg/dL — ABNORMAL HIGH (ref 70–99)

## 2019-07-29 LAB — C-REACTIVE PROTEIN: CRP: 13.5 mg/dL — ABNORMAL HIGH (ref <1.0)

## 2019-07-29 LAB — MAGNESIUM: Magnesium: 2.3 mg/dL (ref 1.7–2.4)

## 2019-07-29 LAB — PHOSPHORUS: Phosphorus: 3.1 mg/dL (ref 2.5–4.6)

## 2019-07-29 LAB — D-DIMER, QUANTITATIVE: D-Dimer, Quant: 0.85 ug/mL-FEU — ABNORMAL HIGH (ref 0.00–0.50)

## 2019-07-29 MED ORDER — INSULIN DETEMIR 100 UNIT/ML FLEXPEN: 32.0000 [IU] | PEN_INJECTOR | Freq: Every evening | SUBCUTANEOUS | Status: DC

## 2019-07-29 MED ORDER — INSULIN ASPART 100 UNIT/ML ~~LOC~~ SOLN
0.0000 [IU] | Freq: Every day | SUBCUTANEOUS | Status: DC
  Administered 2019-07-29: 22:00:00 4 [IU] via SUBCUTANEOUS
  Administered 2019-07-30: 3 [IU] via SUBCUTANEOUS

## 2019-07-29 MED ORDER — INSULIN ASPART 100 UNIT/ML ~~LOC~~ SOLN
25.0000 [IU] | Freq: Once | SUBCUTANEOUS | Status: AC
  Administered 2019-07-29: 25 [IU] via SUBCUTANEOUS

## 2019-07-29 MED ORDER — SODIUM CHLORIDE 0.9 % IV SOLN
INTRAVENOUS | Status: DC | PRN
  Administered 2019-07-29: 250 mL via INTRAVENOUS

## 2019-07-29 MED ORDER — CLOPIDOGREL BISULFATE 75 MG PO TABS
75.0000 mg | ORAL_TABLET | Freq: Every day | ORAL | Status: DC
  Administered 2019-07-29 – 2019-07-31 (×3): 75 mg via ORAL
  Filled 2019-07-29 (×3): qty 1

## 2019-07-29 MED ORDER — INSULIN ASPART 100 UNIT/ML ~~LOC~~ SOLN
0.0000 [IU] | Freq: Three times a day (TID) | SUBCUTANEOUS | Status: DC
  Administered 2019-07-30: 12:00:00 15 [IU] via SUBCUTANEOUS
  Administered 2019-07-30: 09:00:00 11 [IU] via SUBCUTANEOUS
  Administered 2019-07-31: 4 [IU] via SUBCUTANEOUS
  Administered 2019-07-31: 15 [IU] via SUBCUTANEOUS

## 2019-07-29 MED ORDER — INSULIN ASPART 100 UNIT/ML ~~LOC~~ SOLN: 28.0000 [IU] | Freq: Once | SUBCUTANEOUS | Status: DC

## 2019-07-29 MED ORDER — INSULIN ASPART 100 UNIT/ML ~~LOC~~ SOLN
5.0000 [IU] | Freq: Three times a day (TID) | SUBCUTANEOUS | Status: DC
  Administered 2019-07-30 (×2): 5 [IU] via SUBCUTANEOUS

## 2019-07-29 MED ORDER — SODIUM POLYSTYRENE SULFONATE 15 GM/60ML PO SUSP
30.0000 g | Freq: Once | ORAL | Status: AC
  Administered 2019-07-29: 30 g
  Filled 2019-07-29: qty 120

## 2019-07-29 MED ORDER — INSULIN GLARGINE 100 UNIT/ML ~~LOC~~ SOLN
30.0000 [IU] | Freq: Every day | SUBCUTANEOUS | Status: DC
  Administered 2019-07-30: 30 [IU] via SUBCUTANEOUS
  Filled 2019-07-29 (×2): qty 0.3

## 2019-07-29 MED ORDER — INSULIN GLARGINE 100 UNIT/ML ~~LOC~~ SOLN
28.0000 [IU] | Freq: Every day | SUBCUTANEOUS | Status: DC
  Filled 2019-07-29: qty 0.28

## 2019-07-29 NOTE — Progress Notes (Signed)
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  Apparently, his abdominal pain resolved in the emergency department.  Since hospitalization, his hematuria has also resolved.  The patient also complained of a new cough without any shortness of breath, chest pain, nausea, vomiting, diarrhea.  The patient was noted to have a low-grade temperature of 99.5 F with hypoxia with oxygenation 89% on room air.  His SARS-CoV2 RT PCR was positive.  The patient was started on remdesivir and dexamethasone.  CT of the abdomen pelvis showed changes of chronic pancreatitis without changes hepatic steatosis, and bilateral renal cyst without hydronephrosis.  Upon presentation, the patient was noted to have serum creatinine from 1.54 with mildly elevated potassium.  WBC was 5.9 with hemoglobin 11.3 and platelets 149,000.  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 -Kayexalate x1 -am BMP  Acute on chronic renal failure--CKD stage II -Baseline creatinine 1.0-1.2 -Secondary to volume depletion -Serum creatinine peaked 1.54  Uncontrolled diabetes mellitus type 2 with hyperglycemia -07/27/2019 hemoglobin A1c 10.4 -Increase Lantus to 28 units -Continue NovoLog sliding scale -Holding metformin and glipizide  Chronic alcoholic pancreatitis -No abdominal pain presently  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   Objective: Vitals:   07/28/19 1350 07/28/19 2119 07/28/19 2128 07/29/19 0619  BP: 128/75  124/77 130/64  Pulse: (!) 103 70 93 82  Resp: 18  18 20   Temp: 99.8 F (37.7 C)  98.3 F (36.8 C) 98 F (36.7 C)  TempSrc: Oral  Oral Oral  SpO2: 96% 94% 96% 97%  Weight:      Height:        Intake/Output Summary (Last 24 hours) at 07/29/2019 1246 Last data filed at 07/29/2019 1115 Gross per 24 hour  Intake 691.13 ml  Output 2050 ml  Net -1358.87 ml   Weight change:  Exam:   General:  Pt is alert, follows commands appropriately, not in acute distress  HEENT: No icterus, No thrush, No neck mass, [place]/AT  Cardiovascular: RRR, S1/S2, no rubs, no gallops  Respiratory: bibasilar rales. No wheeze  Abdomen: Soft/+BS, non tender, non distended, no guarding  Extremities: No edema, No lymphangitis, No petechiae, No rashes, no synovitis   Data Reviewed: I have personally reviewed following labs and imaging studies Basic Metabolic Panel: Recent Labs  Lab 07/27/19 1126 07/28/19 0657 07/29/19 0700  NA 131* 134* 133*  K  5.2* 5.1 5.4*  CL 94* 99 97*  CO2 24 25 25   GLUCOSE 317* 250* 202*  BUN 43* 32* 29*  CREATININE 1.54* 1.41* 1.27*  CALCIUM 8.4* 8.5* 8.7*  MG  --   --  2.3  PHOS  --   --  3.1   Liver Function Tests: Recent Labs  Lab 07/27/19  1126 07/29/19 0700  AST 25 30  ALT 18 18  ALKPHOS 94 92  BILITOT 0.3 0.5  PROT 6.8 7.2  ALBUMIN 3.1* 3.2*   Recent Labs  Lab 07/27/19 1126  LIPASE 22   No results for input(s): AMMONIA in the last 168 hours. Coagulation Profile: No results for input(s): INR, PROTIME in the last 168 hours. CBC: Recent Labs  Lab 07/27/19 1126 07/29/19 0700  WBC 5.9 6.2  NEUTROABS 5.0 5.2  HGB 11.9* 12.8*  HCT 38.6* 42.0  MCV 81.1 81.2  PLT 149* 156   Cardiac Enzymes: No results for input(s): CKTOTAL, CKMB, CKMBINDEX, TROPONINI in the last 168 hours. BNP: Invalid input(s): POCBNP CBG: Recent Labs  Lab 07/28/19 1125 07/28/19 1622 07/28/19 2126 07/29/19 0746 07/29/19 1107  GLUCAP 294* 330* 252* 199* 300*   HbA1C: Recent Labs    07/27/19 1126  HGBA1C 10.4*   Urine analysis:    Component Value Date/Time   COLORURINE YELLOW 07/27/2019 1201   APPEARANCEUR HAZY (A) 07/27/2019 1201   LABSPEC 1.012 07/27/2019 1201   PHURINE 5.0 07/27/2019 1201   GLUCOSEU >=500 (A) 07/27/2019 1201   HGBUR LARGE (A) 07/27/2019 1201   BILIRUBINUR NEGATIVE 07/27/2019 1201   KETONESUR NEGATIVE 07/27/2019 1201   PROTEINUR 30 (A) 07/27/2019 1201   UROBILINOGEN 0.2 04/27/2015 1916   NITRITE NEGATIVE 07/27/2019 1201   LEUKOCYTESUR TRACE (A) 07/27/2019 1201   Sepsis Labs: @LABRCNTIP (procalcitonin:4,lacticidven:4) )No results found for this or any previous visit (from the past 240 hour(s)).   Scheduled Meds: . aspirin EC  81 mg Oral Daily  . atorvastatin  40 mg Oral q1800  . clopidogrel  75 mg Oral Daily  . enoxaparin (LOVENOX) injection  40 mg Subcutaneous Q24H  . feeding supplement (GLUCERNA 1.2 CAL)  480 mL Per Tube TID  . free water  150 mL Per Tube Q8H  . insulin aspart  0-15 Units Subcutaneous TID WC  . insulin aspart  0-5 Units Subcutaneous QHS  . insulin glargine  22 Units Subcutaneous QHS  . methylPREDNISolone (SOLU-MEDROL) injection  0.5 mg/kg Intravenous Q12H  . pantoprazole  40 mg  Oral Daily  . traZODone  100 mg Oral QHS   Continuous Infusions: . sodium chloride 250 mL (07/29/19 0937)  . remdesivir 100 mg in NS 100 mL 100 mg (07/29/19 0940)    Procedures/Studies: Ct Abdomen Pelvis W Contrast  Result Date: 07/27/2019 CLINICAL DATA:  Right lower quadrant abdomen pain. Assess for appendicitis. EXAM: CT ABDOMEN AND PELVIS WITH CONTRAST TECHNIQUE: Multidetector CT imaging of the abdomen and pelvis was performed using the standard protocol following bolus administration of intravenous contrast. CONTRAST:  160mL OMNIPAQUE IOHEXOL 300 MG/ML  SOLN COMPARISON:  April 19, 2017 FINDINGS: Lower chest: Mild atelectasis of right lung base is identified. Hepatobiliary: Generalized low density of the liver is identified without focal liver lesion. The gallbladder is normal. The biliary tree is normal. Pancreas: Calcification of the pancreatic tail and pancreatic head is unchanged compared prior exam. The pancreatic duct measures 4 mm, unchanged. Spleen: Normal in size without focal abnormality. Adrenals/Urinary Tract: The bilateral adrenal glands are normal. Simple cysts are identified in  both kidneys. There is no hydronephrosis bilaterally. The bladder is normal. Stomach/Bowel: The patient tube is identified. The stomach is otherwise normal. There is no small bowel obstruction. The colon is normal. The appendix is normal. Vascular/Lymphatic: Aortic atherosclerosis. No enlarged abdominal or pelvic lymph nodes. Reproductive: Prostate gland measures 5.2 cm in diameter. Other: None. Musculoskeletal: Degenerative joint changes of the spine are noted. IMPRESSION: Normal appendix.  No bowel obstruction. Chronic pancreatitis. Bilateral kidney cysts. Electronically Signed   By: Abelardo Diesel M.D.   On: 07/27/2019 15:03   Dg Chest Portable 1 View  Result Date: 07/27/2019 CLINICAL DATA:  Shortness of breath with abdominal pain 3 days and hematuria. Productive cough. COVID. EXAM: PORTABLE CHEST 1 VIEW  COMPARISON:  09/25/2016 FINDINGS: Lungs are adequately inflated demonstrate subtle patchy density over the perihilar regions of the mid to lower lungs which may be due to infection. No effusion. Cardiomediastinal silhouette and remainder of the exam is unchanged. IMPRESSION: Subtle patchy density over the perihilar regions of the mid to lower lungs which may be due to infection. Electronically Signed   By: Marin Olp M.D.   On: 07/27/2019 12:19    Orson Eva, DO  [facility] Pager [phone]  If 7PM-7AM, please contact night-coverage [email or web address] Password [email or web address] 07/29/2019, 12:46 PM   LOS: 1 day

## 2019-07-30 DIAGNOSIS — C060 Malignant neoplasm of cheek mucosa: Secondary | ICD-10-CM | POA: Diagnosis not present

## 2019-07-30 DIAGNOSIS — U071 COVID-19: Secondary | ICD-10-CM | POA: Diagnosis not present

## 2019-07-30 DIAGNOSIS — J9601 Acute respiratory failure with hypoxia: Secondary | ICD-10-CM | POA: Diagnosis not present

## 2019-07-30 DIAGNOSIS — N179 Acute kidney failure, unspecified: Secondary | ICD-10-CM | POA: Diagnosis not present

## 2019-07-30 LAB — CBC WITH DIFFERENTIAL/PLATELET
Abs Immature Granulocytes: 0.01 10*3/uL (ref 0.00–0.07)
Basophils Absolute: 0 10*3/uL (ref 0.0–0.1)
Basophils Relative: 0 %
Eosinophils Absolute: 0 10*3/uL (ref 0.0–0.5)
Eosinophils Relative: 0 %
HCT: 42.3 % (ref 39.0–52.0)
Hemoglobin: 12.9 g/dL — ABNORMAL LOW (ref 13.0–17.0)
Immature Granulocytes: 0 %
Lymphocytes Relative: 4 %
Lymphs Abs: 0.3 10*3/uL — ABNORMAL LOW (ref 0.7–4.0)
MCH: 24.6 pg — ABNORMAL LOW (ref 26.0–34.0)
MCHC: 30.5 g/dL (ref 30.0–36.0)
MCV: 80.7 fL (ref 80.0–100.0)
Monocytes Absolute: 0.3 10*3/uL (ref 0.1–1.0)
Monocytes Relative: 3 %
Neutro Abs: 7.3 10*3/uL (ref 1.7–7.7)
Neutrophils Relative %: 93 %
Platelets: 187 10*3/uL (ref 150–400)
RBC: 5.24 MIL/uL (ref 4.22–5.81)
RDW: 16.9 % — ABNORMAL HIGH (ref 11.5–15.5)
WBC: 7.9 10*3/uL (ref 4.0–10.5)
nRBC: 0.3 % — ABNORMAL HIGH (ref 0.0–0.2)

## 2019-07-30 LAB — GLUCOSE, CAPILLARY
Glucose-Capillary: 209 mg/dL — ABNORMAL HIGH (ref 70–99)
Glucose-Capillary: 258 mg/dL — ABNORMAL HIGH (ref 70–99)
Glucose-Capillary: 310 mg/dL — ABNORMAL HIGH (ref 70–99)
Glucose-Capillary: 295 mg/dL — ABNORMAL HIGH (ref 70–99)

## 2019-07-30 LAB — COMPREHENSIVE METABOLIC PANEL
ALT: 19 U/L (ref 0–44)
AST: 27 U/L (ref 15–41)
Albumin: 3 g/dL — ABNORMAL LOW (ref 3.5–5.0)
Alkaline Phosphatase: 91 U/L (ref 38–126)
Anion gap: 9 (ref 5–15)
BUN: 34 mg/dL — ABNORMAL HIGH (ref 8–23)
CO2: 28 mmol/L (ref 22–32)
Calcium: 9.2 mg/dL (ref 8.9–10.3)
Chloride: 98 mmol/L (ref 98–111)
Creatinine, Ser: 1.28 mg/dL — ABNORMAL HIGH (ref 0.61–1.24)
GFR calc Af Amer: >60 mL/min (ref >60)
GFR calc non Af Amer: 57 mL/min — ABNORMAL LOW (ref >60)
Glucose, Bld: 280 mg/dL — ABNORMAL HIGH (ref 70–99)
Potassium: 4.9 mmol/L (ref 3.5–5.1)
Sodium: 135 mmol/L (ref 135–145)
Total Bilirubin: 0.4 mg/dL (ref 0.3–1.2)
Total Protein: 7 g/dL (ref 6.5–8.1)

## 2019-07-30 LAB — D-DIMER, QUANTITATIVE: D-Dimer, Quant: 0.46 ug/mL-FEU (ref 0.00–0.50)

## 2019-07-30 LAB — FERRITIN: Ferritin: 120 ng/mL (ref 24–336)

## 2019-07-30 LAB — C-REACTIVE PROTEIN: CRP: 6.7 mg/dL — ABNORMAL HIGH (ref <1.0)

## 2019-07-30 LAB — PHOSPHORUS: Phosphorus: 3.3 mg/dL (ref 2.5–4.6)

## 2019-07-30 LAB — MAGNESIUM: Magnesium: 2.3 mg/dL (ref 1.7–2.4)

## 2019-07-30 MED ORDER — INSULIN GLARGINE 100 UNIT/ML ~~LOC~~ SOLN
40.0000 [IU] | Freq: Every day | SUBCUTANEOUS | Status: DC
  Administered 2019-07-30: 40 [IU] via SUBCUTANEOUS
  Filled 2019-07-30 (×2): qty 0.4

## 2019-07-30 MED ORDER — INSULIN ASPART 100 UNIT/ML ~~LOC~~ SOLN: 10.0000 [IU] | Freq: Three times a day (TID) | SUBCUTANEOUS | Status: DC

## 2019-07-30 MED ORDER — INSULIN ASPART 100 UNIT/ML ~~LOC~~ SOLN
5.0000 [IU] | Freq: Three times a day (TID) | SUBCUTANEOUS | Status: DC
  Administered 2019-07-31 (×2): 5 [IU] via SUBCUTANEOUS

## 2019-07-30 MED ORDER — INSULIN ASPART 100 UNIT/ML ~~LOC~~ SOLN: 5.0000 [IU] | Freq: Three times a day (TID) | SUBCUTANEOUS | Status: DC

## 2019-07-30 NOTE — Progress Notes (Signed)
Inpatient Diabetes Program Recommendations  AACE/ADA: New Consensus Statement on Inpatient Glycemic Control   Target Ranges:  Prepandial:   less than 140 mg/dL      Peak postprandial:   less than 180 mg/dL (1-2 hours)      Critically ill patients:  140 - 180 mg/dL  Results for MANJOT, HINKS (MRN [id number]) as of 07/30/2019 07:54  Ref. Range 07/30/2019 05:37  Glucose Latest Ref Range: 70 - 99 mg/dL 280 (H)   Results for CARLYN, MULLENBACH (MRN [id number]) as of 07/30/2019 07:54  Ref. Range 07/29/2019 07:46 07/29/2019 11:07 07/29/2019 16:11 07/29/2019 21:23  Glucose-Capillary Latest Ref Range: 70 - 99 mg/dL 199 (H) 300 (H) 426 (H) 328 (H)   Results for EUCLIDE, GRANITO (MRN [id number]) as of 07/30/2019 07:54  Ref. Range 02/26/2019 13:53 07/27/2019 11:26  Hemoglobin A1C Latest Ref Range: 4.8 - 5.6 % 8.2 (H) 10.4 (H)   Review of Glycemic Control  Diabetes history: DM2 Outpatient Diabetes medications: Glipizide XL 2.5 mg QAM, Levemir 22 units QHS, Metformin XR 1000 mg QAM, Metformin 500 mg Q afternoon, Metformin 500 mg QPM Current orders for Inpatient glycemic control: Lantus 30 units QHS, Novolog 5 units TID with meals for meal coverage, Novolog 0-20 units TID with meals, Novolog 0-5 units QHS; Solumedrol 41.25 mg Q12H  Inpatient Diabetes Program Recommendations:  Insulin - Basal: If steroids are continued, please consider increasing Lantus to 40 units QHS.  Correction (SSI): Per documentation, patient only eating 10-25% of meals. May want to consider changing CBGs Q4Hs and Novolog to 0-20 units Q4H.  Thanks, Barnie Alderman, RN, MSN, CDE Diabetes Coordinator Inpatient Diabetes Program [phone] (Team Pager from 8am to 5pm)

## 2019-07-30 NOTE — Progress Notes (Addendum)
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  Apparently, his abdominal pain resolved in the emergency department.  Since hospitalization, his hematuria has also resolved.  The patient also complained of a new cough without any shortness of breath, chest pain, nausea, vomiting, diarrhea.  The patient was noted to have a low-grade temperature of 99.5 F with hypoxia with oxygenation 89% on room air.  His SARS-CoV2 RT PCR was positive.  The patient was started on remdesivir and dexamethasone.  CT of the abdomen pelvis showed changes of chronic pancreatitis without changes hepatic steatosis, and bilateral renal cyst without hydronephrosis.  Upon presentation, the patient was noted to have serum creatinine from 1.54 with mildly elevated potassium.  WBC was 5.9 with hemoglobin 11.3 and platelets 149,000.  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 -Kayexalate x1 on 12/6 -am BMP  Acute on chronic renal failure--CKD stage II -Baseline creatinine 1.0-1.2 -Secondary to volume depletion -Serum creatinine peaked 1.54  Uncontrolled diabetes mellitus type 2 with hyperglycemia -07/27/2019 hemoglobin A1c 10.4 -Increase Lantus to 40 units -add novolog 5 units with meals -Continue NovoLog sliding scale -Holding metformin and glipizide  Chronic  alcoholic pancreatitis -No abdominal pain presently  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   Objective: Vitals:   07/29/19 1920 07/29/19 2127 07/30/19 0551 07/30/19 1311  BP:  130/85 131/87 132/83  Pulse: 95 99 86 93  Resp: 16 18 17 18   Temp:  98.2 F (36.8 C) 98.5 F (36.9 C) 98.2 F (36.8 C)  TempSrc:  Oral Oral Oral  SpO2: 99% 99% 98% 100%  Weight:      Height:        Intake/Output Summary (Last 24 hours) at 07/30/2019 1638 Last data filed at 07/30/2019 1000 Gross per 24 hour  Intake 1095.27 ml  Output 1140 ml  Net -44.73 ml   Weight change:  Exam:   General:  Pt is alert, follows commands appropriately, not in acute distress  HEENT: No icterus, No thrush, No neck mass, [place]/AT  Cardiovascular: RRR, S1/S2, no rubs, no gallops  Respiratory: bibasilar rales. No wheeze  Abdomen: Soft/+BS, non tender, non distended, no guarding  Extremities: No edema, No lymphangitis, No petechiae, No rashes, no synovitis   Data Reviewed: I have personally reviewed following labs and imaging studies Basic Metabolic Panel: Recent Labs  Lab 07/27/19 1126 07/28/19 0657 07/29/19 0700 07/30/19 0537  NA 131* 134* 133* 135  K 5.2* 5.1 5.4* 4.9  CL 94* 99 97* 98  CO2 24 25 25 28   GLUCOSE 317* 250* 202* 280*  BUN 43* 32* 29* 34*  CREATININE 1.54* 1.41* 1.27*  1.28*  CALCIUM 8.4* 8.5* 8.7* 9.2  MG  --   --  2.3 2.3  PHOS  --   --  3.1 3.3   Liver Function Tests: Recent Labs  Lab 07/27/19 1126 07/29/19 0700 07/30/19 0537  AST 25 30 27   ALT 18 18 19   ALKPHOS 94 92 91  BILITOT 0.3 0.5 0.4  PROT 6.8 7.2 7.0  ALBUMIN 3.1* 3.2* 3.0*   Recent Labs  Lab 07/27/19 1126  LIPASE 22   No results for input(s): AMMONIA in the last 168 hours. Coagulation Profile: No results for input(s): INR, PROTIME in the last 168 hours. CBC: Recent Labs  Lab 07/27/19 1126 07/29/19 0700 07/30/19 0537  WBC 5.9 6.2 7.9  NEUTROABS 5.0 5.2 7.3  HGB 11.9* 12.8* 12.9*  HCT 38.6* 42.0 42.3  MCV 81.1 81.2 80.7  PLT 149* 156 187   Cardiac Enzymes: No results for input(s): CKTOTAL, CKMB, CKMBINDEX, TROPONINI in the last 168 hours. BNP: Invalid input(s): POCBNP CBG: Recent Labs  Lab 07/29/19 1107 07/29/19 1611 07/29/19 2123 07/30/19 0814 07/30/19 1117  GLUCAP 300* 426* 328* 258* 310*   HbA1C: No results for input(s): HGBA1C in the last 72 hours. Urine analysis:    Component Value Date/Time   COLORURINE YELLOW 07/27/2019 1201   APPEARANCEUR HAZY (A) 07/27/2019 1201   LABSPEC 1.012 07/27/2019 1201   PHURINE 5.0 07/27/2019 1201   GLUCOSEU >=500 (A) 07/27/2019 1201   HGBUR LARGE (A) 07/27/2019 1201   BILIRUBINUR NEGATIVE 07/27/2019 1201   KETONESUR NEGATIVE 07/27/2019 1201   PROTEINUR 30 (A) 07/27/2019 1201   UROBILINOGEN 0.2 04/27/2015 1916   NITRITE NEGATIVE 07/27/2019 1201   LEUKOCYTESUR TRACE (A) 07/27/2019 1201   Sepsis Labs: @LABRCNTIP (procalcitonin:4,lacticidven:4) )No results found for this or any previous visit (from the past 240 hour(s)).   Scheduled Meds:  aspirin EC  81 mg Oral Daily   atorvastatin  40 mg Oral q1800   clopidogrel  75 mg Oral Daily   enoxaparin (LOVENOX) injection  40 mg Subcutaneous Q24H   feeding supplement (GLUCERNA 1.2 CAL)  480 mL Per Tube TID   free water  150 mL Per Tube Q8H   insulin aspart   0-20 Units Subcutaneous TID WC   insulin aspart  0-5 Units Subcutaneous QHS   insulin aspart  5 Units Subcutaneous TID WC   insulin glargine  30 Units Subcutaneous QHS   methylPREDNISolone (SOLU-MEDROL) injection  0.5 mg/kg Intravenous Q12H   pantoprazole  40 mg Oral Daily   traZODone  100 mg Oral QHS   Continuous Infusions:  sodium chloride 250 mL (07/29/19 0937)   remdesivir 100 mg in NS 100 mL 100 mg (07/30/19 0842)    Procedures/Studies: Ct Abdomen Pelvis W Contrast  Result Date: 07/27/2019 CLINICAL DATA:  Right lower quadrant abdomen pain. Assess for appendicitis. EXAM: CT ABDOMEN AND PELVIS WITH CONTRAST TECHNIQUE: Multidetector CT imaging of the abdomen and pelvis was performed using the standard protocol following bolus administration of intravenous contrast. CONTRAST:  16mL OMNIPAQUE IOHEXOL 300 MG/ML  SOLN COMPARISON:  April 19, 2017 FINDINGS: Lower chest: Mild atelectasis of right lung base is identified. Hepatobiliary: Generalized low density of the liver is identified without focal liver lesion. The gallbladder is normal. The biliary tree is normal. Pancreas:  Calcification of the pancreatic tail and pancreatic head is unchanged compared prior exam. The pancreatic duct measures 4 mm, unchanged. Spleen: Normal in size without focal abnormality. Adrenals/Urinary Tract: The bilateral adrenal glands are normal. Simple cysts are identified in both kidneys. There is no hydronephrosis bilaterally. The bladder is normal. Stomach/Bowel: The patient tube is identified. The stomach is otherwise normal. There is no small bowel obstruction. The colon is normal. The appendix is normal. Vascular/Lymphatic: Aortic atherosclerosis. No enlarged abdominal or pelvic lymph nodes. Reproductive: Prostate gland measures 5.2 cm in diameter. Other: None. Musculoskeletal: Degenerative joint changes of the spine are noted. IMPRESSION: Normal appendix.  No bowel obstruction. Chronic pancreatitis. Bilateral  kidney cysts. Electronically Signed   By: Abelardo Diesel M.D.   On: 07/27/2019 15:03   Dg Chest Portable 1 View  Result Date: 07/27/2019 CLINICAL DATA:  Shortness of breath with abdominal pain 3 days and hematuria. Productive cough. COVID. EXAM: PORTABLE CHEST 1 VIEW COMPARISON:  09/25/2016 FINDINGS: Lungs are adequately inflated demonstrate subtle patchy density over the perihilar regions of the mid to lower lungs which may be due to infection. No effusion. Cardiomediastinal silhouette and remainder of the exam is unchanged. IMPRESSION: Subtle patchy density over the perihilar regions of the mid to lower lungs which may be due to infection. Electronically Signed   By: Marin Olp M.D.   On: 07/27/2019 12:19    Orson Eva, DO  [facility] Pager [phone]  If 7PM-7AM, please contact night-coverage [email or web address] Password [email or web address] 07/30/2019, 4:38 PM   LOS: 2 days

## 2019-07-31 DIAGNOSIS — C060 Malignant neoplasm of cheek mucosa: Secondary | ICD-10-CM | POA: Diagnosis not present

## 2019-07-31 DIAGNOSIS — K860 Alcohol-induced chronic pancreatitis: Secondary | ICD-10-CM | POA: Diagnosis not present

## 2019-07-31 DIAGNOSIS — I10 Essential (primary) hypertension: Secondary | ICD-10-CM

## 2019-07-31 DIAGNOSIS — N179 Acute kidney failure, unspecified: Secondary | ICD-10-CM | POA: Diagnosis not present

## 2019-07-31 DIAGNOSIS — J9601 Acute respiratory failure with hypoxia: Secondary | ICD-10-CM | POA: Diagnosis not present

## 2019-07-31 LAB — CBC WITH DIFFERENTIAL/PLATELET
Abs Immature Granulocytes: 0.03 10*3/uL (ref 0.00–0.07)
Basophils Absolute: 0 10*3/uL (ref 0.0–0.1)
Basophils Relative: 0 %
Eosinophils Absolute: 0 10*3/uL (ref 0.0–0.5)
Eosinophils Relative: 0 %
HCT: 43.4 % (ref 39.0–52.0)
Hemoglobin: 13.5 g/dL (ref 13.0–17.0)
Immature Granulocytes: 0 %
Lymphocytes Relative: 4 %
Lymphs Abs: 0.4 10*3/uL — ABNORMAL LOW (ref 0.7–4.0)
MCH: 25.4 pg — ABNORMAL LOW (ref 26.0–34.0)
MCHC: 31.1 g/dL (ref 30.0–36.0)
MCV: 81.6 fL (ref 80.0–100.0)
Monocytes Absolute: 0.4 10*3/uL (ref 0.1–1.0)
Monocytes Relative: 4 %
Neutro Abs: 9.2 10*3/uL — ABNORMAL HIGH (ref 1.7–7.7)
Neutrophils Relative %: 92 %
Platelets: 200 10*3/uL (ref 150–400)
RBC: 5.32 MIL/uL (ref 4.22–5.81)
RDW: 17.2 % — ABNORMAL HIGH (ref 11.5–15.5)
WBC: 10 10*3/uL (ref 4.0–10.5)
nRBC: 0.2 % (ref 0.0–0.2)

## 2019-07-31 LAB — COMPREHENSIVE METABOLIC PANEL
ALT: 17 U/L (ref 0–44)
AST: 21 U/L (ref 15–41)
Albumin: 3.2 g/dL — ABNORMAL LOW (ref 3.5–5.0)
Alkaline Phosphatase: 93 U/L (ref 38–126)
Anion gap: 12 (ref 5–15)
BUN: 36 mg/dL — ABNORMAL HIGH (ref 8–23)
CO2: 28 mmol/L (ref 22–32)
Calcium: 9.3 mg/dL (ref 8.9–10.3)
Chloride: 98 mmol/L (ref 98–111)
Creatinine, Ser: 1.28 mg/dL — ABNORMAL HIGH (ref 0.61–1.24)
GFR calc Af Amer: >60 mL/min (ref >60)
GFR calc non Af Amer: 57 mL/min — ABNORMAL LOW (ref >60)
Glucose, Bld: 279 mg/dL — ABNORMAL HIGH (ref 70–99)
Potassium: 5 mmol/L (ref 3.5–5.1)
Sodium: 138 mmol/L (ref 135–145)
Total Bilirubin: 0.4 mg/dL (ref 0.3–1.2)
Total Protein: 7.1 g/dL (ref 6.5–8.1)

## 2019-07-31 LAB — GLUCOSE, CAPILLARY
Glucose-Capillary: 72 mg/dL (ref 70–99)
Glucose-Capillary: 200 mg/dL — ABNORMAL HIGH (ref 70–99)
Glucose-Capillary: 325 mg/dL — ABNORMAL HIGH (ref 70–99)

## 2019-07-31 LAB — C-REACTIVE PROTEIN: CRP: 2.8 mg/dL — ABNORMAL HIGH (ref <1.0)

## 2019-07-31 LAB — D-DIMER, QUANTITATIVE: D-Dimer, Quant: 0.53 ug/mL-FEU — ABNORMAL HIGH (ref 0.00–0.50)

## 2019-07-31 LAB — PHOSPHORUS: Phosphorus: 3.8 mg/dL (ref 2.5–4.6)

## 2019-07-31 LAB — MAGNESIUM: Magnesium: 2.2 mg/dL (ref 1.7–2.4)

## 2019-07-31 MED ORDER — LEVEMIR FLEXTOUCH 100 UNIT/ML ~~LOC~~ SOPN: 40.0000 [IU] | PEN_INJECTOR | Freq: Every day | SUBCUTANEOUS | 11 refills | Status: DC

## 2019-07-31 MED ORDER — PREDNISONE 20 MG PO TABS: 40.0000 mg | ORAL_TABLET | Freq: Two times a day (BID) | ORAL | 0 refills | Status: DC

## 2019-07-31 NOTE — Evaluation (Signed)
Clinical/Bedside Swallow Evaluation Patient Details  Name: Colin Wagner MRN: [id number] Date of Birth: August 17, 1951  Today's Date: 07/31/2019 Time: SLP Start Time (ACUTE ONLY): 21 SLP Stop Time (ACUTE ONLY): 1300 SLP Time Calculation (min) (ACUTE ONLY): 30 min  Past Medical History:  Past Medical History:  Diagnosis Date  . Alcohol abuse   . Alcoholic pancreatitis 5852   admission  . Chronic back pain   . Chronic pancreatitis ([place])    based on ct findings 2016  . COPD (chronic obstructive pulmonary disease) ([place])   . Diabetes mellitus    type 2  . Diverticulosis   . Gastritis   . GERD (gastroesophageal reflux disease)   . Headache   . History of radiation therapy 05/12/17- 06/22/17   Left cheek and bilateral neck/ 60 Gy in 30 fractions to gross disease  . Hyperlipidemia   . Hypertension   . 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 Peptic ulcer disease 1999   Per medical reports, no H pylori  . Pneumonia   . Renal cancer, left ([place]) 2012   he tells me that he has been released, ?and that he is free of cancer, and never had it to begin with.   . Right shoulder pain   . Testicular hypofunction    Past Surgical History:  Past Surgical History:  Procedure Laterality Date  . COLONOSCOPY  2003   Dr. [name], polyps  . COLONOSCOPY  2005   Dr. [name], multiple diverticula  . COLONOSCOPY  2008   Dr. [name], diverticulosis  . COLONOSCOPY WITH PROPOFOL N/A 10/21/2016   Dr. [name]: Diverticulosis, two 5-7 mm polyps removed. path-tubular adenomas.  Next colonoscopy in 5 years.  . ESOPHAGOGASTRODUODENOSCOPY     Multiple EGDs. 1999 EGD showed gastric ulcers, no H pylori and benign biopsies performed by Dr. [name]. 2001 gastric ulcer healed. Last EGD 2005 had gastritis.  . IR REPLACE G-TUBE SIMPLE WO FLUORO  03/20/2019  . IR REPLC GASTRO/COLONIC TUBE PERCUT W/FLUORO  05/16/2018  . Left partial mandibulectomy, Scapular free flap reconstruction, selective  neck dissection, tracheotomy, and resection of intraoral palate cancer. Left 03/01/2017   [facility]  . LUNG BIOPSY    . MOUTH SURGERY    . PARTIAL NEPHRECTOMY Left 2012  . POLYPECTOMY  10/21/2016   Procedure: POLYPECTOMY;  Surgeon: [name], MD;  Location: [facility];  Service: Endoscopy;;  hepatic flexure x2  . 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  Apparently, his abdominal pain resolved in the emergency department.  Since hospitalization, his hematuria has also resolved.  The patient also complained of a new cough without any shortness of breath, chest pain, nausea, vomiting, diarrhea.  The patient was noted to have a low-grade temperature of 99.5 F with hypoxia with oxygenation 89% on room air.  His SARS-CoV2 RT PCR was positive.  The patient was started on remdesivir and dexamethasone.  CT of the abdomen pelvis showed changes of chronic pancreatitis without changes hepatic steatosis, and bilateral renal cyst without hydronephrosis.  Upon presentation, the patient was noted to have serum creatinine from 1.54 with mildly elevated potassium.  WBC was 5.9 with hemoglobin 11.3 and platelets 149,000. Colin Wagner is known to SLP service from previous dysphagia therapy (last 03/2019). He had MBSS at [facility] 03/15/19 with recommendation for D3 and thin liquids with primary source of nutrition via PEG (he has had a PEG since his CA treatment in 2018). He underwent surgery with Dr. Vertell Limber (ENT) to revise contracture of  left lower lip on 07/18/2019 and was admitted to [facility] on 07/27/19. BSE requested.   Assessment / Plan / Recommendation Clinical Impression  Clinical swallow evaluation completed in room. Pt is known to this SLP from previous dysphagia therapy in August due to h/o radiation induced fibrosis from [facility] treatment. Pt has been using PEG since 2018 and supplements nutrition orally with mechanical soft textures and thin liquids. Pt with longstanding h/o of dysphagia given the above and is schedule for an outpatient MBSS at [facility] 08/15/19. Pt presents with reduced left labial closure, however slight improvement from August. He self fed thin water via cup and straw without overt signs or symptoms of aspiration with modifications given reduced labial closure. Pt exhibited left buccal pocketing with solid food trials and benefited from liquid wash and use of finger manipulation to assist. Pt is ok to continue mechanical soft textures and thin liquids with use of strategies (alternate solids/liquids, assist with buccal pocketing and left labial spillage, continue with good oral care, and multiple swallows prn). Pt should follow up with planned MBSS at [facility] in a couple of weeks.   SLP Visit Diagnosis: Dysphagia, unspecified (R13.10)    Aspiration Risk  Mild aspiration risk    Diet Recommendation Dysphagia 3 (Mech soft);Thin liquid;Alternative means - long-term   Liquid Administration via: Cup;Straw Medication Administration: Whole meds with puree Supervision: Patient able to self feed;Intermittent supervision to cue for compensatory strategies Compensations: Slow rate;Small sips/bites;Multiple dry swallows after each bite/sip;Follow solids with liquid;Lingual sweep for clearance of pocketing;Monitor for anterior loss Postural Changes: Seated upright at 90 degrees;Remain upright for at least 30 minutes after po intake    Other  Recommendations Oral Care Recommendations: Oral care BID Other Recommendations:  Clarify dietary restrictions   Follow up Recommendations Other (comment)(f/u for scheduled MBSS at [facility] 12/23)      Frequency and Duration            Prognosis Prognosis for Safe Diet Advancement: Fair Barriers to Reach Goals: Severity of deficits      Swallow Study   General Date of Onset: 07/27/19 HPI: Colin Wagner is a 68 year old male with a history of alcohol abuse, alcoholic pancreatitis, COPD, diabetes mellitus type 2, 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  Apparently, his abdominal pain resolved in the emergency department.  Since hospitalization, his hematuria has also resolved.  The patient also complained of a new cough without any shortness of breath, chest pain, nausea, vomiting, diarrhea.  The patient was noted to have a low-grade temperature of 99.5 F with hypoxia with oxygenation 89% on room air.  His SARS-CoV2 RT PCR was positive.  The patient was started on remdesivir and dexamethasone.  CT of the abdomen pelvis showed changes of chronic pancreatitis without changes hepatic steatosis, and bilateral renal cyst without hydronephrosis.  Upon presentation, the patient was noted to have serum creatinine from 1.54 with mildly elevated potassium.  WBC was 5.9 with hemoglobin  11.3 and platelets 149,000. Colin Wagner is known to SLP service from previous dysphagia therapy (last 03/2019). He had MBSS at [facility] 03/15/19 with recommendation for D3 and thin liquids with primary source of nutrition via PEG (he has had a PEG since his CA treatment in 2018). He underwent surgery with Dr. Vertell Limber (ENT) to revise contracture of left lower lip on 07/18/2019 and was admitted to [facility] on 07/27/19. BSE requested. Type of Study: Bedside Swallow Evaluation Previous Swallow Assessment: MBSS 03/15/19 D3/thin with primary source of nutrition via PEG Diet Prior to this Study: PEG  tube;Dysphagia 3 (soft);Thin liquids Temperature Spikes Noted: No Respiratory Status: Nasal cannula History of Recent Intubation: No Behavior/Cognition: Alert;Cooperative;Pleasant mood Oral Cavity Assessment: Other (comment)(recent revision of contracture of left lower lip) Oral Care Completed by SLP: Yes Oral Cavity - Dentition: Poor condition Vision: Functional for self-feeding Self-Feeding Abilities: Able to feed self Patient Positioning: Upright in bed Baseline Vocal Quality: Normal Volitional Cough: Strong Volitional Swallow: Able to elicit    Oral/Motor/Sensory Function Overall Oral Motor/Sensory Function: Moderate impairment Facial ROM: Reduced left   Ice Chips Ice chips: Within functional limits Presentation: Spoon   Thin Liquid Thin Liquid: Within functional limits Presentation: Self Fed;Cup;Straw    Nectar Thick Nectar Thick Liquid: Not tested   Honey Thick Honey Thick Liquid: Not tested   Puree Puree: Within functional limits Presentation: Self Fed;Spoon   Solid     Solid: Impaired Presentation: Spoon Oral Phase Impairments: Reduced labial seal Oral Phase Functional Implications: Left lateral sulci pocketing;Left anterior spillage     Thank you,  Genene Churn, [place] 07/31/2019,2:58 PM

## 2019-07-31 NOTE — Progress Notes (Addendum)
Inpatient Diabetes Program Recommendations  AACE/ADA: New Consensus Statement on Inpatient Glycemic Control   Target Ranges:  Prepandial:   less than 140 mg/dL      Peak postprandial:   less than 180 mg/dL (1-2 hours)      Critically ill patients:  140 - 180 mg/dL   Results for Colin Wagner, Colin Wagner (MRN [id number]) as of 07/31/2019 09:19  Ref. Range 07/30/2019 08:14 07/30/2019 11:17 07/30/2019 22:26 07/31/2019 08:54  Glucose-Capillary Latest Ref Range: 70 - 99 mg/dL 258 (H) 310 (H) 295 (H) 200 (H)   Results for Colin Wagner, Colin Wagner (MRN [id number]) as of 07/31/2019 09:19  Ref. Range 02/26/2019 13:53 07/27/2019 11:26  Hemoglobin A1C Latest Ref Range: 4.8 - 5.6 % 8.2 (H) 10.4 (H)   Review of Glycemic Control  Diabetes history: DM2 Outpatient Diabetes medications: Glipizide XL 2.5 mg QAM, Levemir 22 units QHS, Metformin XR 1000 mg QAM, Metformin 500 mg Q afternoon, Metformin 500 mg QPM Current orders for Inpatient glycemic control: Lantus 40 units QHS, Novolog 5 units TID with meals for meal coverage, Novolog 0-20 units TID with meals, Novolog 0-5 units QHS; Solumedrol 41.25 mg Q12H  Inpatient Diabetes Program Recommendations:   Insulin - Basal: If steroids are continued, please consider increasing Lantus to 44 units QHS.  Insulin-Meal Coverage: If steroids are continued, please consider increasing meal coverage to Novolog 10 units TID with meals if patient eats at least 50% of meals.  Addendum 07/31/19@14 :26-Spoke with patient over the phone regarding DM control. Patient states that Dr. Karie Kirks manages his DM and he is taking all DM medications as noted above. Discussed A1C of 10.4% on 07/27/19 indicating an average glucose of 252 mg/dl. Explained that recent Solumedrol has been contributing to hyperglycemia and that per the discharge summary today he is discharging on Prednisone which is going to increase glucose as well. Encouraged patient to continue checking glucose at home and if he is seeing his  glucose is consistently over 180 mg/dl advised him to call Dr. Karie Kirks for recommendations on what he needs to do to improve DM control. Stressed importance of improve DM control and getting A1C toward target of 7% or less.  Patient verbalized understanding of information and states that he has no questions at this time.  Thanks, Barnie Alderman, RN, MSN, CDE Diabetes Coordinator Inpatient Diabetes Program [phone] (Team Pager from 8am to 5pm)

## 2019-07-31 NOTE — Discharge Summary (Signed)
Physician Discharge Summary  Colin Wagner QIO:[id number] DOB: Dec 14, 1950 DOA: 07/27/2019  PCP: [name], MD  Admit date: 07/27/2019 Discharge date: 07/31/2019  Admitted From: Home Disposition:  Home   Recommendations for Outpatient Follow-up:  1. Follow up with PCP in 1-2 weeks 2. 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  Apparently, his abdominal pain resolved in the emergency department.  Since hospitalization, his hematuria has also resolved.  The patient also complained of a new cough without any shortness of breath, chest pain, nausea, vomiting, diarrhea.  The patient was noted to have a low-grade temperature of 99.5 F with hypoxia with oxygenation 89% on room air.  His SARS-CoV2 RT PCR was positive.  The patient was started on remdesivir and dexamethasone.  CT of the abdomen pelvis showed changes of chronic pancreatitis without changes hepatic steatosis, and bilateral renal cyst without hydronephrosis.  Upon presentation, the patient was noted to have serum creatinine from 1.54 with mildly elevated potassium.  WBC was 5.9 with hemoglobin 11.3 and platelets 149,000.  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 -Baseline creatinine 1.0-1.2 -Secondary to volume depletion -Serum creatinine peaked 1.54 -serum creatinine 1.28 on day of d/c  Uncontrolled diabetes mellitus type 2 with hyperglycemia -07/27/2019 hemoglobin A1c 10.4 -Increase Lantus to 40 units -d/c home with levemir x 40 units x 6 days while on steroids, then back to 22units daily thereafter -Continue NovoLog sliding scale -Holding metformin and glipizide--restart after dc  Chronic alcoholic pancreatitis -No abdominal pain presently  Squamous cell carcinoma of the buccal mucosa -Continue enteral feedings with Glucerna -Outpatient follow-up with ENT -12/8--speech eval--mechanical soft with clears;  MBS as outpt  Essential hypertension -restart HCTZ -Continue to monitor  Hyperlipidemia -Continue statin    Discharge Instructions   Allergies as of 07/31/2019   No Known Allergies     Medication List    STOP taking these medications   amoxicillin 500 MG capsule Commonly known as: AMOXIL   celecoxib 100 MG capsule Commonly known as: CELEBREX   lisinopril 5 MG tablet Commonly known as: ZESTRIL     TAKE these medications   aspirin EC 81 MG tablet Take 81 mg by mouth daily.   atorvastatin 40 MG tablet Commonly known as: LIPITOR Take 40 mg by mouth daily at 6 PM.   cetirizine 10 MG tablet Commonly known as: ZYRTEC Take 10 mg by mouth daily.   clopidogrel 75 MG tablet Commonly known as: PLAVIX Take 75 mg by mouth daily.   cyclobenzaprine 10 MG tablet Commonly known as: FLEXERIL Take 10 mg by mouth 2 (two) times a day.   fluticasone 50 MCG/ACT nasal spray Commonly known as: FLONASE Place 2  sprays into both nostrils daily.   free water Soln Place 100 mLs into feeding tube every 8 (eight) hours.    glipiZIDE 2.5 MG 24 hr tablet Commonly known as: GLUCOTROL XL Take 2.5 mg by mouth every morning.   GLUCERNA 1.5 CAL PO 480 mLs by PEG Tube route 3 (three) times daily. 120 ml water before 271ml water after   hydrochlorothiazide 12.5 MG tablet Commonly known as: HYDRODIURIL Take 12.5 mg by mouth daily.   HYDROcodone-acetaminophen 7.5-325 MG tablet Commonly known as: NORCO Take 1 tablet by mouth every 6 (six) hours as needed (pain). What changed: reasons to take this   Levemir FlexTouch 100 UNIT/ML Pen Generic drug: Insulin Detemir Inject 40 Units into the skin at bedtime. X 6 more nights, then return back to 22 units at bedtime What changed:   how much to take  when to take this  additional instructions   lidocaine 2 % solution Commonly known as: XYLOCAINE Patient: Mix 1part 2% viscous lidocaine, 1part H20. Swish &/or swallow 18mL of diluted mixture, 30min before meals and at bedtime, up to QID What changed:   how much to take  how to take this  when to take this  reasons to take this  additional instructions   LORazepam 1 MG tablet Commonly known as: ATIVAN Take 1 mg by mouth 4 (four) times daily as needed for anxiety.   metFORMIN 500 MG 24 hr tablet Commonly known as: GLUCOPHAGE-XR Take 500-1,000 mg by mouth See admin instructions. Take 1000 mg by mouth in the morning, take 500 mg by mouth in the afternoon and 500 mg by mouth in the evening   phenazopyridine 200 MG tablet Commonly known as: Pyridium Take 1 tablet (200 mg total) by mouth 3 (three) times daily as needed for pain (burning with urination).   Protonix 40 MG tablet Generic drug: pantoprazole Take 40 mg by mouth daily.   senna 8.6 MG Tabs tablet Commonly known as: SENOKOT Take 1 tablet (8.6 mg total) by mouth at bedtime as needed for mild constipation. May be needed as pain medication can constipate.   traZODone 50 MG tablet Commonly known as: DESYREL Take 100 mg by mouth at bedtime.        No Known Allergies  Consultations:  none   Procedures/Studies: Ct Abdomen Pelvis W Contrast  Result Date: 07/27/2019 CLINICAL DATA:  Right lower quadrant abdomen pain. Assess for appendicitis. EXAM: CT ABDOMEN AND PELVIS WITH CONTRAST TECHNIQUE: Multidetector CT imaging of the abdomen and pelvis was performed using the standard protocol following bolus administration of intravenous contrast. CONTRAST:  111mL OMNIPAQUE IOHEXOL 300 MG/ML  SOLN COMPARISON:  April 19, 2017 FINDINGS: Lower chest: Mild atelectasis of right lung base is identified. Hepatobiliary: Generalized low density of the liver is identified without focal liver lesion. The gallbladder is normal. The biliary tree is normal. Pancreas: Calcification of the pancreatic tail and pancreatic head is unchanged compared prior exam. The pancreatic duct measures 4 mm, unchanged. Spleen: Normal in size without focal abnormality. Adrenals/Urinary Tract: The bilateral adrenal glands are normal. Simple cysts are identified in both kidneys. There is no hydronephrosis bilaterally. The bladder is normal. Stomach/Bowel: The patient tube is identified. The stomach is otherwise normal. There is no small bowel obstruction. The colon is normal. The appendix is normal. Vascular/Lymphatic: Aortic atherosclerosis. No enlarged abdominal or pelvic lymph nodes. Reproductive: Prostate gland measures 5.2 cm in diameter. Other: None. Musculoskeletal: Degenerative joint changes of the spine are noted. IMPRESSION: Normal appendix.  No  bowel obstruction. Chronic pancreatitis. Bilateral kidney cysts. Electronically Signed   By: Abelardo Diesel M.D.   On: 07/27/2019 15:03   Dg Chest Portable 1 View  Result Date: 07/27/2019 CLINICAL DATA:  Shortness of breath with abdominal pain 3 days and hematuria. Productive cough. COVID. EXAM: PORTABLE CHEST 1 VIEW COMPARISON:  09/25/2016 FINDINGS: Lungs are adequately inflated demonstrate subtle patchy density over the perihilar  regions of the mid to lower lungs which may be due to infection. No effusion. Cardiomediastinal silhouette and remainder of the exam is unchanged. IMPRESSION: Subtle patchy density over the perihilar regions of the mid to lower lungs which may be due to infection. Electronically Signed   By: Marin Olp M.D.   On: 07/27/2019 12:19         Discharge Exam: Vitals:   07/30/19 2228 07/31/19 0533  BP: (!) 137/92 135/87  Pulse: 95 87  Resp: 18 18  Temp: 97.8 F (36.6 C) 97.6 F (36.4 C)  SpO2: 97% 97%   Vitals:   07/30/19 1311 07/30/19 2014 07/30/19 2228 07/31/19 0533  BP: 132/83  (!) 137/92 135/87  Pulse: 93  95 87  Resp: 18  18 18   Temp: 98.2 F (36.8 C)  97.8 F (36.6 C) 97.6 F (36.4 C)  TempSrc: Oral  Oral Oral  SpO2: 100% 91% 97% 97%  Weight:      Height:        General: Pt is alert, awake, not in acute distress Cardiovascular: RRR, S1/S2 +, no rubs, no gallops Respiratory: bibasilar rales. No wheeze Abdominal: Soft, NT, ND, bowel sounds + Extremities: no edema, no cyanosis   The results of significant diagnostics from this hospitalization (including imaging, microbiology, ancillary and laboratory) are listed below for reference.    Significant Diagnostic Studies: Ct Abdomen Pelvis W Contrast  Result Date: 07/27/2019 CLINICAL DATA:  Right lower quadrant abdomen pain. Assess for appendicitis. EXAM: CT ABDOMEN AND PELVIS WITH CONTRAST TECHNIQUE: Multidetector CT imaging of the abdomen and pelvis was performed using the standard protocol following bolus administration of intravenous contrast. CONTRAST:  157mL OMNIPAQUE IOHEXOL 300 MG/ML  SOLN COMPARISON:  April 19, 2017 FINDINGS: Lower chest: Mild atelectasis of right lung base is identified. Hepatobiliary: Generalized low density of the liver is identified without focal liver lesion. The gallbladder is normal. The biliary tree is normal. Pancreas: Calcification of the pancreatic tail and pancreatic head is unchanged  compared prior exam. The pancreatic duct measures 4 mm, unchanged. Spleen: Normal in size without focal abnormality. Adrenals/Urinary Tract: The bilateral adrenal glands are normal. Simple cysts are identified in both kidneys. There is no hydronephrosis bilaterally. The bladder is normal. Stomach/Bowel: The patient tube is identified. The stomach is otherwise normal. There is no small bowel obstruction. The colon is normal. The appendix is normal. Vascular/Lymphatic: Aortic atherosclerosis. No enlarged abdominal or pelvic lymph nodes. Reproductive: Prostate gland measures 5.2 cm in diameter. Other: None. Musculoskeletal: Degenerative joint changes of the spine are noted. IMPRESSION: Normal appendix.  No bowel obstruction. Chronic pancreatitis. Bilateral kidney cysts. Electronically Signed   By: Abelardo Diesel M.D.   On: 07/27/2019 15:03   Dg Chest Portable 1 View  Result Date: 07/27/2019 CLINICAL DATA:  Shortness of breath with abdominal pain 3 days and hematuria. Productive cough. COVID. EXAM: PORTABLE CHEST 1 VIEW COMPARISON:  09/25/2016 FINDINGS: Lungs are adequately inflated demonstrate subtle patchy density over the perihilar regions of the mid to lower lungs which may be due to infection. No effusion. Cardiomediastinal silhouette and remainder  of the exam is unchanged. IMPRESSION: Subtle patchy density over the perihilar regions of the mid to lower lungs which may be due to infection. Electronically Signed   By: Marin Olp M.D.   On: 07/27/2019 12:19     Microbiology: No results found for this or any previous visit (from the past 240 hour(s)).   Labs: Basic Metabolic Panel: Recent Labs  Lab 07/27/19 1126 07/28/19 0657 07/29/19 0700 07/30/19 0537 07/31/19 0458  NA 131* 134* 133* 135 138  K 5.2* 5.1 5.4* 4.9 5.0  CL 94* 99 97* 98 98  CO2 24 25 25 28 28   GLUCOSE 317* 250* 202* 280* 279*  BUN 43* 32* 29* 34* 36*  CREATININE 1.54* 1.41* 1.27* 1.28* 1.28*  CALCIUM 8.4* 8.5* 8.7* 9.2 9.3    MG  --   --  2.3 2.3 2.2  PHOS  --   --  3.1 3.3 3.8   Liver Function Tests: Recent Labs  Lab 07/27/19 1126 07/29/19 0700 07/30/19 0537 07/31/19 0458  AST 25 30 27 21   ALT 18 18 19 17   ALKPHOS 94 92 91 93  BILITOT 0.3 0.5 0.4 0.4  PROT 6.8 7.2 7.0 7.1  ALBUMIN 3.1* 3.2* 3.0* 3.2*   Recent Labs  Lab 07/27/19 1126  LIPASE 22   No results for input(s): AMMONIA in the last 168 hours. CBC: Recent Labs  Lab 07/27/19 1126 07/29/19 0700 07/30/19 0537 07/31/19 0458  WBC 5.9 6.2 7.9 10.0  NEUTROABS 5.0 5.2 7.3 9.2*  HGB 11.9* 12.8* 12.9* 13.5  HCT 38.6* 42.0 42.3 43.4  MCV 81.1 81.2 80.7 81.6  PLT 149* 156 187 200   Cardiac Enzymes: No results for input(s): CKTOTAL, CKMB, CKMBINDEX, TROPONINI in the last 168 hours. BNP: Invalid input(s): POCBNP CBG: Recent Labs  Lab 07/30/19 1117 07/30/19 1645 07/30/19 2226 07/31/19 0854 07/31/19 1121  GLUCAP 310* 72 295* 200* 325*    Time coordinating discharge:  36 minutes  Signed:  Orson Eva, DO [facility] Pager: [phone] 07/31/2019, 1:50 PM

## 2019-08-09 ENCOUNTER — Other Ambulatory Visit: Payer: Self-pay

## 2019-08-09 ENCOUNTER — Ambulatory Visit: Payer: Medicare Other | Attending: Internal Medicine

## 2019-08-09 DIAGNOSIS — Z20822 Contact with and (suspected) exposure to covid-19: Secondary | ICD-10-CM

## 2019-08-10 ENCOUNTER — Other Ambulatory Visit: Payer: Self-pay | Admitting: Internal Medicine

## 2019-08-10 DIAGNOSIS — U071 COVID-19: Secondary | ICD-10-CM

## 2019-08-11 ENCOUNTER — Telehealth: Payer: Self-pay | Admitting: General Practice

## 2019-08-11 LAB — NOVEL CORONAVIRUS, NAA: SARS-CoV-2, NAA: NOT DETECTED

## 2019-08-11 NOTE — Telephone Encounter (Signed)
Negative COVID results given. Patient results "NOT Detected." Caller expressed understanding. ° °

## 2019-09-21 ENCOUNTER — Ambulatory Visit (HOSPITAL_COMMUNITY)
Admission: RE | Admit: 2019-09-21 | Discharge: 2019-09-21 | Disposition: A | Discharge status: Home / Self Care | Payer: Medicare Other | Source: Ambulatory Visit | Attending: Radiology | Admitting: Radiology

## 2019-09-21 ENCOUNTER — Other Ambulatory Visit (HOSPITAL_COMMUNITY): Payer: Self-pay | Admitting: Radiology

## 2019-09-21 ENCOUNTER — Other Ambulatory Visit: Payer: Self-pay

## 2019-09-21 DIAGNOSIS — R633 Feeding difficulties, unspecified: Secondary | ICD-10-CM

## 2019-09-21 DIAGNOSIS — Z431 Encounter for attention to gastrostomy: Secondary | ICD-10-CM | POA: Diagnosis not present

## 2019-09-21 HISTORY — PX: IR REPLACE G-TUBE SIMPLE WO FLUORO: IMG2323

## 2019-09-21 NOTE — Procedures (Signed)
Patient's 65 French balloon retention gastrostomy tube was exchanged out for new 69 French balloon retention gastrostomy tube without immediate complications.  8 cc saline used to fill balloon. Tube secured to skin site. EBL none.

## 2019-11-05 ENCOUNTER — Inpatient Hospital Stay (HOSPITAL_COMMUNITY)
Admission: EM | Admit: 2019-11-05 | Discharge: 2019-11-07 | DRG: 377 | Discharge status: Against Medical Advice | Payer: Medicare Other | Attending: Internal Medicine | Admitting: Internal Medicine

## 2019-11-05 ENCOUNTER — Encounter (HOSPITAL_COMMUNITY): Payer: Self-pay

## 2019-11-05 ENCOUNTER — Other Ambulatory Visit: Payer: Self-pay

## 2019-11-05 DIAGNOSIS — Z7982 Long term (current) use of aspirin: Secondary | ICD-10-CM

## 2019-11-05 DIAGNOSIS — Z86010 Personal history of colonic polyps: Secondary | ICD-10-CM

## 2019-11-05 DIAGNOSIS — Z905 Acquired absence of kidney: Secondary | ICD-10-CM | POA: Diagnosis not present

## 2019-11-05 DIAGNOSIS — Z923 Personal history of irradiation: Secondary | ICD-10-CM

## 2019-11-05 DIAGNOSIS — R1313 Dysphagia, pharyngeal phase: Secondary | ICD-10-CM | POA: Diagnosis present

## 2019-11-05 DIAGNOSIS — Z8616 Personal history of COVID-19: Secondary | ICD-10-CM

## 2019-11-05 DIAGNOSIS — Z85528 Personal history of other malignant neoplasm of kidney: Secondary | ICD-10-CM | POA: Diagnosis not present

## 2019-11-05 DIAGNOSIS — Z85818 Personal history of malignant neoplasm of other sites of lip, oral cavity, and pharynx: Secondary | ICD-10-CM

## 2019-11-05 DIAGNOSIS — K8681 Exocrine pancreatic insufficiency: Secondary | ICD-10-CM | POA: Diagnosis not present

## 2019-11-05 DIAGNOSIS — K859 Acute pancreatitis without necrosis or infection, unspecified: Secondary | ICD-10-CM | POA: Diagnosis not present

## 2019-11-05 DIAGNOSIS — Z794 Long term (current) use of insulin: Secondary | ICD-10-CM

## 2019-11-05 DIAGNOSIS — K219 Gastro-esophageal reflux disease without esophagitis: Secondary | ICD-10-CM | POA: Diagnosis present

## 2019-11-05 DIAGNOSIS — K5900 Constipation, unspecified: Secondary | ICD-10-CM | POA: Diagnosis present

## 2019-11-05 DIAGNOSIS — F1010 Alcohol abuse, uncomplicated: Secondary | ICD-10-CM | POA: Diagnosis not present

## 2019-11-05 DIAGNOSIS — Z8711 Personal history of peptic ulcer disease: Secondary | ICD-10-CM

## 2019-11-05 DIAGNOSIS — Z931 Gastrostomy status: Secondary | ICD-10-CM

## 2019-11-05 DIAGNOSIS — Z8249 Family history of ischemic heart disease and other diseases of the circulatory system: Secondary | ICD-10-CM | POA: Diagnosis not present

## 2019-11-05 DIAGNOSIS — K625 Hemorrhage of anus and rectum: Secondary | ICD-10-CM | POA: Diagnosis present

## 2019-11-05 DIAGNOSIS — I10 Essential (primary) hypertension: Secondary | ICD-10-CM | POA: Diagnosis present

## 2019-11-05 DIAGNOSIS — K921 Melena: Principal | ICD-10-CM | POA: Diagnosis present

## 2019-11-05 DIAGNOSIS — G8929 Other chronic pain: Secondary | ICD-10-CM | POA: Diagnosis not present

## 2019-11-05 DIAGNOSIS — J449 Chronic obstructive pulmonary disease, unspecified: Secondary | ICD-10-CM | POA: Diagnosis not present

## 2019-11-05 DIAGNOSIS — Z87891 Personal history of nicotine dependence: Secondary | ICD-10-CM | POA: Diagnosis not present

## 2019-11-05 DIAGNOSIS — E785 Hyperlipidemia, unspecified: Secondary | ICD-10-CM | POA: Diagnosis present

## 2019-11-05 DIAGNOSIS — Z7902 Long term (current) use of antithrombotics/antiplatelets: Secondary | ICD-10-CM

## 2019-11-05 DIAGNOSIS — D649 Anemia, unspecified: Secondary | ICD-10-CM | POA: Diagnosis not present

## 2019-11-05 DIAGNOSIS — Z20822 Contact with and (suspected) exposure to covid-19: Secondary | ICD-10-CM | POA: Diagnosis present

## 2019-11-05 DIAGNOSIS — Z79899 Other long term (current) drug therapy: Secondary | ICD-10-CM

## 2019-11-05 DIAGNOSIS — E11319 Type 2 diabetes mellitus with unspecified diabetic retinopathy without macular edema: Secondary | ICD-10-CM | POA: Diagnosis not present

## 2019-11-05 DIAGNOSIS — E119 Type 2 diabetes mellitus without complications: Secondary | ICD-10-CM | POA: Diagnosis not present

## 2019-11-05 DIAGNOSIS — K860 Alcohol-induced chronic pancreatitis: Secondary | ICD-10-CM | POA: Diagnosis present

## 2019-11-05 DIAGNOSIS — Z79891 Long term (current) use of opiate analgesic: Secondary | ICD-10-CM

## 2019-11-05 LAB — CBC WITH DIFFERENTIAL/PLATELET
Abs Immature Granulocytes: 0.01 10*3/uL (ref 0.00–0.07)
Basophils Absolute: 0 10*3/uL (ref 0.0–0.1)
Basophils Relative: 0 %
Eosinophils Absolute: 0.2 10*3/uL (ref 0.0–0.5)
Eosinophils Relative: 3 %
HCT: 32.1 % — ABNORMAL LOW (ref 39.0–52.0)
Hemoglobin: 10 g/dL — ABNORMAL LOW (ref 13.0–17.0)
Immature Granulocytes: 0 %
Lymphocytes Relative: 9 %
Lymphs Abs: 0.6 10*3/uL — ABNORMAL LOW (ref 0.7–4.0)
MCH: 26.2 pg (ref 26.0–34.0)
MCHC: 31.2 g/dL (ref 30.0–36.0)
MCV: 84 fL (ref 80.0–100.0)
Monocytes Absolute: 0.6 10*3/uL (ref 0.1–1.0)
Monocytes Relative: 8 %
Neutro Abs: 5.8 10*3/uL (ref 1.7–7.7)
Neutrophils Relative %: 80 %
Platelets: 220 10*3/uL (ref 150–400)
RBC: 3.82 MIL/uL — ABNORMAL LOW (ref 4.22–5.81)
RDW: 18.1 % — ABNORMAL HIGH (ref 11.5–15.5)
WBC: 7.2 10*3/uL (ref 4.0–10.5)
nRBC: 0 % (ref 0.0–0.2)

## 2019-11-05 LAB — GLUCOSE, CAPILLARY: Glucose-Capillary: 115 mg/dL — ABNORMAL HIGH (ref 70–99)

## 2019-11-05 LAB — HEMOGLOBIN A1C
Hgb A1c MFr Bld: 7 % — ABNORMAL HIGH (ref 4.8–5.6)
Mean Plasma Glucose: 154.2 mg/dL

## 2019-11-05 LAB — COMPREHENSIVE METABOLIC PANEL
ALT: 16 U/L (ref 0–44)
AST: 18 U/L (ref 15–41)
Albumin: 3.5 g/dL (ref 3.5–5.0)
Alkaline Phosphatase: 60 U/L (ref 38–126)
Anion gap: 6 (ref 5–15)
BUN: 47 mg/dL — ABNORMAL HIGH (ref 8–23)
CO2: 28 mmol/L (ref 22–32)
Calcium: 8.9 mg/dL (ref 8.9–10.3)
Chloride: 103 mmol/L (ref 98–111)
Creatinine, Ser: 1.61 mg/dL — ABNORMAL HIGH (ref 0.61–1.24)
GFR calc Af Amer: 50 mL/min — ABNORMAL LOW (ref >60)
GFR calc non Af Amer: 43 mL/min — ABNORMAL LOW (ref >60)
Glucose, Bld: 166 mg/dL — ABNORMAL HIGH (ref 70–99)
Potassium: 4.7 mmol/L (ref 3.5–5.1)
Sodium: 137 mmol/L (ref 135–145)
Total Bilirubin: 0.6 mg/dL (ref 0.3–1.2)
Total Protein: 6.3 g/dL — ABNORMAL LOW (ref 6.5–8.1)

## 2019-11-05 LAB — POC OCCULT BLOOD, ED: Fecal Occult Bld: POSITIVE — AB

## 2019-11-05 LAB — CBG MONITORING, ED: Glucose-Capillary: 131 mg/dL — ABNORMAL HIGH (ref 70–99)

## 2019-11-05 LAB — TYPE AND SCREEN
ABO/RH(D): A POS
Antibody Screen: NEGATIVE

## 2019-11-05 LAB — PROTIME-INR
INR: 1.1 (ref 0.8–1.2)
Prothrombin Time: 13.7 seconds (ref 11.4–15.2)

## 2019-11-05 MED ORDER — ATORVASTATIN CALCIUM 40 MG PO TABS
40.0000 mg | ORAL_TABLET | Freq: Every day | ORAL | Status: DC
  Administered 2019-11-06: 40 mg via ORAL
  Filled 2019-11-05: qty 1

## 2019-11-05 MED ORDER — INSULIN ASPART 100 UNIT/ML ~~LOC~~ SOLN
0.0000 [IU] | Freq: Three times a day (TID) | SUBCUTANEOUS | Status: DC
  Administered 2019-11-06: 5 [IU] via SUBCUTANEOUS

## 2019-11-05 MED ORDER — FLUTICASONE PROPIONATE 50 MCG/ACT NA SUSP
2.0000 | Freq: Every day | NASAL | Status: DC
  Administered 2019-11-05 – 2019-11-06 (×2): 2 via NASAL
  Filled 2019-11-05 (×2): qty 16

## 2019-11-05 MED ORDER — TRAZODONE HCL 100 MG PO TABS
100.0000 mg | ORAL_TABLET | Freq: Every day | ORAL | Status: DC
  Administered 2019-11-05 – 2019-11-06 (×2): 100 mg via ORAL
  Filled 2019-11-05: qty 1
  Filled 2019-11-05: qty 2

## 2019-11-05 MED ORDER — SENNA 8.6 MG PO TABS: 1.0000 | ORAL_TABLET | Freq: Every evening | ORAL | Status: DC | PRN

## 2019-11-05 MED ORDER — GLUCERNA 1.5 CAL PO LIQD
480.0000 mL | Freq: Three times a day (TID) | ORAL | Status: DC
  Administered 2019-11-06 – 2019-11-07 (×3): 480 mL
  Filled 2019-11-05 (×7): qty 711

## 2019-11-05 MED ORDER — LORATADINE 10 MG PO TABS
10.0000 mg | ORAL_TABLET | Freq: Every day | ORAL | Status: DC
  Administered 2019-11-06 – 2019-11-07 (×2): 10 mg via ORAL
  Filled 2019-11-05 (×2): qty 1

## 2019-11-05 MED ORDER — HYDROCODONE-ACETAMINOPHEN 7.5-325 MG PO TABS: 1.0000 | ORAL_TABLET | Freq: Four times a day (QID) | ORAL | Status: DC | PRN

## 2019-11-05 MED ORDER — PANTOPRAZOLE SODIUM 40 MG PO TBEC: 40.0000 mg | DELAYED_RELEASE_TABLET | Freq: Every day | ORAL | Status: DC

## 2019-11-05 MED ORDER — INSULIN DETEMIR 100 UNIT/ML ~~LOC~~ SOLN
40.0000 [IU] | Freq: Every day | SUBCUTANEOUS | Status: DC
  Administered 2019-11-05: 40 [IU] via SUBCUTANEOUS
  Filled 2019-11-05 (×2): qty 0.4

## 2019-11-05 MED ORDER — SODIUM CHLORIDE 0.9 % IV SOLN: INTRAVENOUS | Status: DC

## 2019-11-05 MED ORDER — CYCLOBENZAPRINE HCL 10 MG PO TABS
10.0000 mg | ORAL_TABLET | Freq: Two times a day (BID) | ORAL | Status: DC
  Administered 2019-11-05 – 2019-11-07 (×4): 10 mg via ORAL
  Filled 2019-11-05 (×4): qty 1

## 2019-11-05 MED ORDER — LORAZEPAM 1 MG PO TABS: 1.0000 mg | ORAL_TABLET | Freq: Four times a day (QID) | ORAL | Status: DC | PRN

## 2019-11-05 MED ORDER — PANTOPRAZOLE SODIUM 40 MG IV SOLR
40.0000 mg | Freq: Two times a day (BID) | INTRAVENOUS | Status: DC
  Administered 2019-11-05 – 2019-11-07 (×4): 40 mg via INTRAVENOUS
  Filled 2019-11-05 (×4): qty 40

## 2019-11-05 MED ORDER — FREE WATER
100.0000 mL | Freq: Three times a day (TID) | Status: DC
  Administered 2019-11-05 – 2019-11-07 (×4): 100 mL

## 2019-11-05 MED ORDER — HYDROCHLOROTHIAZIDE 25 MG PO TABS
12.5000 mg | ORAL_TABLET | Freq: Every day | ORAL | Status: DC
  Administered 2019-11-06: 12.5 mg via ORAL
  Filled 2019-11-05: qty 1

## 2019-11-05 MED ORDER — LIDOCAINE VISCOUS HCL 2 % MT SOLN: 5.0000 mL | Freq: Three times a day (TID) | OROMUCOSAL | Status: DC | PRN

## 2019-11-05 NOTE — H&P (Signed)
History and Physical    Colin Wagner NTI:[id number] DOB: [birth year] DOA: 11/05/2019  PCP: [name], MD (Confirm with patient/family/NH records and if not entered, this has to be entered at [facility] point of entry) Patient coming from: home  I have personally briefly reviewed patient's old medical records in [place]  Chief Complaint: rectal bleeding  HPI: Colin Wagner is a 69 y.o. 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 He does have a long-term  gastrostomy tube which he still uses once a day but is able to eat and drink.  He has chronic alcoholic pancreatitis, chronic alcohol abuse and has had a history of gastrointestinal bleeding after using excessive amounts of BC powders in the past.  He is followed by Dr. Laural Golden but according to the patient he does not follow very closely with him.  He does have a history of COPD but no longer smokes cigarettes.  The patient reports that yesterday he had 2 episodes of dark blood in his stools, he has not yet had a stool today but wanted to come get checked out.  He denies lightheadedness, shortness of breath, weakness and has no difficulty urinating.  He states he still takes the occasional BC powder  He does take Plavix  His symptoms are intermittent, nothing seems to make it better or worse, not associated with any lightheadedness syncope or shortness of breath.  (For level 3, the HPI must include 4+ descriptors: Location, Quality, Severity, Duration, Timing, Context, modifying factors, associated signs/symptoms and/or status of 3+ chronic problems.)  (Please avoid self-populating past medical history here) (The initial 2-3 lines should be focused and good to copy and paste in the HPI section of the daily progress note).  ED Course: Hemodynamically stable in the ED. Lab reveals Hgb 10 (13.5 07/31/19), glucose 1268, eGFR 50. On exam he had dark bloody stool in the rectal  vault. Dr. Laural Golden was consulted who recommended the patient be admitted, kept to clear liquids with probable LGI procedure 11/06/19. [facility] was called to admit.  Review of Systems: As per HPI otherwise 10 point review of systems negative.  Unacceptable ROS statements: "10 systems reviewed," "Extensive" (without elaboration).  Acceptable ROS statements: "All others negative," "All others reviewed and are negative," and "All others unremarkable," with at [place] documented Can't double dip - if using for HPI can't use for ROS  Past Medical History:  Diagnosis Date  . Alcohol abuse   . Alcoholic pancreatitis 8676   admission  . Chronic back pain   . Chronic pancreatitis ([place])    based on ct findings 2016  . COPD (chronic obstructive pulmonary disease) ([place])   . Diabetes mellitus    type 2  . Diverticulosis   . Gastritis   . GERD (gastroesophageal reflux disease)   . Headache   . History of radiation therapy 05/12/17- 06/22/17   Left cheek and bilateral neck/ 60 Gy in 30 fractions to gross disease  . Hyperlipidemia   . Hypertension   . Jaw cancer ([place])    left jaw part of jaw bone removed  . Peptic ulcer disease 1999   Per medical reports, no H pylori  . Pneumonia   . Renal cancer, left ([place]) 2012   he tells me that he has been released, ?and that he is free of cancer, and never had it to begin with.   . Right shoulder pain   .  Testicular hypofunction     Past Surgical History:  Procedure Laterality Date  . COLONOSCOPY  2003   Dr. [name], polyps  . COLONOSCOPY  2005   Dr. [name], multiple diverticula  . COLONOSCOPY  2008   Dr. [name], diverticulosis  . COLONOSCOPY WITH PROPOFOL N/A 10/21/2016   Dr. [name]: Diverticulosis, two 5-7 mm polyps removed. path-tubular adenomas.  Next colonoscopy in 5 years.  . ESOPHAGOGASTRODUODENOSCOPY     Multiple EGDs. 1999 EGD showed gastric ulcers, no H pylori and benign biopsies performed by Dr. [name]. 2001 gastric ulcer  healed. Last EGD 2005 had gastritis.  . IR REPLACE G-TUBE SIMPLE WO FLUORO  03/20/2019  . IR REPLACE G-TUBE SIMPLE WO FLUORO  09/21/2019  . IR REPLC GASTRO/COLONIC TUBE PERCUT W/FLUORO  05/16/2018  . Left partial mandibulectomy, Scapular free flap reconstruction, selective neck dissection, tracheotomy, and resection of intraoral palate cancer. Left 03/01/2017   [facility]  . LUNG BIOPSY    . MOUTH SURGERY    . PARTIAL NEPHRECTOMY Left 2012  . POLYPECTOMY  10/21/2016   Procedure: POLYPECTOMY;  Surgeon: [name], MD;  Location: [facility] END[facility] SUITE;  Service: Endoscopy;;  hepatic flexure x2  . TRANSURETHRAL RESECTION OF PROSTATE N/A 03/01/2019   Procedure: TRANSURETHRAL RESECTION OF THE PROSTATE (TURP)WITH CYSTOSCOPY;  Surgeon: Raynelle Bring, MD;  Location: [facility] ORS;  Service: Urology;  Laterality: N/A;   Soc Hx - widower, no children. Lives with girlfriend. Retired from IT trainer.   reports that he has quit smoking. He has a 17.50 pack-year smoking history. He has never used smokeless tobacco. He reports that he does not drink alcohol or use drugs.  No Known Allergies  Family History  Problem Relation Age of Onset  . Hypertension Mother   . Colon cancer Neg Hx    Unacceptable: Noncontributory, unremarkable, or negative. Acceptable: Family history reviewed and not pertinent (If you reviewed it)  Prior to Admission medications   Medication Sig Start Date End Date Taking? Authorizing Provider  aspirin EC 81 MG tablet Take 81 mg by mouth daily.   Yes [provider]  atorvastatin (LIPITOR) 40 MG tablet Take 40 mg by mouth daily at 6 PM.  02/14/17  Yes [provider]  cetirizine (ZYRTEC) 10 MG tablet Take 10 mg by mouth daily.   Yes [provider]  clopidogrel (PLAVIX) 75 MG tablet Take 75 mg by mouth daily. 05/27/19  Yes [provider]  cyclobenzaprine (FLEXERIL) 10 MG tablet Take 10 mg by mouth 2 (two) times a day.  09/06/18   Yes [provider]  fluticasone (FLONASE) 50 MCG/ACT nasal spray Place 2 sprays into both nostrils daily.  08/04/18  Yes [provider]  glipiZIDE (GLUCOTROL XL) 2.5 MG 24 hr tablet Take 2.5 mg by mouth every morning. 08/11/18  Yes [provider]  hydrochlorothiazide (HYDRODIURIL) 12.5 MG tablet Take 12.5 mg by mouth daily. 04/24/19  Yes [provider]  HYDROcodone-acetaminophen (NORCO) 7.5-325 MG tablet Take 1 tablet by mouth every 6 (six) hours as needed (pain). Patient taking differently: Take 1 tablet by mouth every 6 (six) hours as needed for moderate pain.  01/12/17  Yes [name], MD  LEVEMIR FLEXTOUCH 100 UNIT/ML Pen Inject 40 Units into the skin at bedtime. X 6 more nights, then return back to 22 units at bedtime 07/31/19  Yes Tat, David, MD  lidocaine (XYLOCAINE) 2 % solution Patient: Mix 1part 2% viscous lidocaine, 1part H20. Swish &/or swallow  64m of diluted mixture, 337m before meals and at bedtime, up to QID Patient taking differently: Use as directed 5 mLs in the mouth or throat 3 (three) times daily as needed for mouth pain. Patient: Mix 1part 2% viscous lidocaine, 1part H20. Swish &/or swallow. 04/14/18  Yes SqEppie GibsonMD  LORazepam (ATIVAN) 1 MG tablet Take 1 mg by mouth 4 (four) times daily as needed for anxiety.  03/31/15  Yes [provider]  Nutritional Supplements (GLUCERNA 1.5 CAL PO) 480 mLs by PEG Tube route 3 (three) times daily. 120 ml water before 20070mater after   Yes [provider]  pantoprazole (PROTONIX) 40 MG tablet Take 40 mg by mouth daily.    Yes [provider]  senna (SENOKOT) 8.6 MG TABS tablet Take 1 tablet (8.6 mg total) by mouth at bedtime as needed for mild constipation. May be needed as pain medication can constipate. 01/12/17  Yes SquEppie GibsonD  traZODone (DESYREL) 50 MG tablet Take 100 mg by mouth at bedtime.    Yes [provider]  Water For Irrigation, Sterile (FREE  WATER) SOLN Place 100 mLs into feeding tube every 8 (eight) hours. 04/22/17  Yes Tat, DavShanon BrowD  metFORMIN (GLUCOPHAGE-XR) 500 MG 24 hr tablet Take 500-1,000 mg by mouth See admin instructions. Take 1000 mg by mouth in the morning, take 500 mg by mouth in the afternoon and 500 mg by mouth in the evening 12/27/17   [provider]  phenazopyridine (PYRIDIUM) 200 MG tablet Take 1 tablet (200 mg total) by mouth 3 (three) times daily as needed for pain (burning with urination). Patient not taking: Reported on 11/05/2019 03/01/19   BorRaynelle BringD  predniSONE (DELTASONE) 20 MG tablet Take 2 tablets (40 mg total) by mouth 2 (two) times daily with a meal. X 6 days Patient not taking: Reported on 11/05/2019 07/31/19   TatOrson EvaD    Physical Exam: Vitals:   11/05/19 1339 11/05/19 1341 11/05/19 1400 11/05/19 1430  BP:  110/73 97/71 100/78  Pulse:  87 87 85  Resp:  18    Temp:  98 F (36.7 C)    TempSrc:  Oral    SpO2:  99% 99% 97%  Weight: 83.5 kg     Height: 5' 11"  (1.803 m)       Constitutional: NAD, calm, comfortable Vitals:   11/05/19 1339 11/05/19 1341 11/05/19 1400 11/05/19 1430  BP:  110/73 97/71 100/78  Pulse:  87 87 85  Resp:  18    Temp:  98 F (36.7 C)    TempSrc:  Oral    SpO2:  99% 99% 97%  Weight: 83.5 kg     Height: 5' 11"  (1.803 m)      General:  WNWD man in no distress Eyes: PERRL, lids and conjunctivae normal ENMT: mouth not examined. Per patient report remain teeth on right side of mouth Neck: normal, supple, no masses, no thyromegaly Respiratory: clear to percussion bilaterally, no wheezing, no crackles. Normal respiratory effort. No accessory muscle use.  Cardiovascular: Regular rate and rhythm, no murmurs / rubs / gallops. No extremity edema. 2+ pedal pulses. No carotid bruits.  Abdomen: obese, G-Tube LUQ, no tenderness, no masses palpated. No hepatosplenomegaly. Bowel sounds positive.  Musculoskeletal: no clubbing / cyanosis. No joint deformity upper and  lower extremities. Good ROM, no contractures. Normal muscle tone.  Skin: no rashes, lesions, ulcers. No induration Neurologic: CN 2-12 grossly intact. Sensation intact, DTR normal. Strength 5/5 in  all 4.  Psychiatric: Normal judgment and insight. Alert and oriented x 3. Normal mood.   (Anything < 9 systems with 2 bullets each down codes to level 1) (If patient refuses exam can't bill higher level) (Make sure to document decubitus ulcers present on admission -- if possible -- and whether patient has chronic indwelling catheter at time of admission)  Labs on Admission: I have personally reviewed following labs and imaging studies  CBC: Recent Labs  Lab 11/05/19 1419  WBC 7.2  NEUTROABS 5.8  HGB 10.0*  HCT 32.1*  MCV 84.0  PLT 048   Basic Metabolic Panel: Recent Labs  Lab 11/05/19 1419  NA 137  K 4.7  CL 103  CO2 28  GLUCOSE 166*  BUN 47*  CREATININE 1.61*  CALCIUM 8.9   GFR: Estimated Creatinine Clearance: 46.1 mL/min (A) (by C-G formula based on SCr of 1.61 mg/dL (H)). Liver Function Tests: Recent Labs  Lab 11/05/19 1419  AST 18  ALT 16  ALKPHOS 60  BILITOT 0.6  PROT 6.3*  ALBUMIN 3.5   No results for input(s): LIPASE, AMYLASE in the last 168 hours. No results for input(s): AMMONIA in the last 168 hours. Coagulation Profile: Recent Labs  Lab 11/05/19 1419  INR 1.1   Cardiac Enzymes: No results for input(s): CKTOTAL, CKMB, CKMBINDEX, TROPONINI in the last 168 hours. BNP (last 3 results) No results for input(s): PROBNP in the last 8760 hours. HbA1C: No results for input(s): HGBA1C in the last 72 hours. CBG: No results for input(s): GLUCAP in the last 168 hours. Lipid Profile: No results for input(s): CHOL, HDL, LDLCALC, TRIG, CHOLHDL, LDLDIRECT in the last 72 hours. Thyroid Function Tests: No results for input(s): TSH, T4TOTAL, FREET4, T3FREE, THYROIDAB in the last 72 hours. Anemia Panel: No results for input(s): VITAMINB12, FOLATE, FERRITIN, TIBC,  IRON, RETICCTPCT in the last 72 hours. Urine analysis:    Component Value Date/Time   COLORURINE YELLOW 07/27/2019 1201   APPEARANCEUR HAZY (A) 07/27/2019 1201   LABSPEC 1.012 07/27/2019 1201   PHURINE 5.0 07/27/2019 1201   GLUCOSEU >=500 (A) 07/27/2019 1201   HGBUR LARGE (A) 07/27/2019 1201   BILIRUBINUR NEGATIVE 07/27/2019 1201   KETONESUR NEGATIVE 07/27/2019 1201   PROTEINUR 30 (A) 07/27/2019 1201   UROBILINOGEN 0.2 04/27/2015 1916   NITRITE NEGATIVE 07/27/2019 1201   LEUKOCYTESUR TRACE (A) 07/27/2019 1201    Radiological Exams on Admission: No results found.  EKG: Independently reviewed. No EKG done  Assessment/Plan Active Problems:   Rectal bleeding   DM type 2 (diabetes mellitus, type 2) (HCC)   Hypertension, uncontrolled   Rectal bleed  (please populate well all problems here in Problem List. (For example, if patient is on BP meds at home and you resume or decide to hold them, it is a problem that needs to be her. Same for CAD, COPD, HLD and so on)   1. Rectal bleeding - recurrent problem. Last blood stool 3/14. ED P exam positive for dark bloody stool. Hemodynamically stable Plan Med-surg admit  IVF - NS @ 50 cc/hr  CBC in AM  Clear liquid diet  GI has been notified and plans to see patient and possibly scope tomorrow  2. DM - last A1C >10%.  Plan Hold Metformin and glipizide.  Continue Levmir  SS coverage - A1C ordered  3. HTN - will continue home meds.    DVT prophylaxis: SCDs (Lovenox/Heparin/SCD's/anticoagulated/None (if comfort care) Code Status: Full code (Full/Partial (specify details) Family Communication: called [name]-  SO, gave Dx and tx plan.  (Specify name, relationship. Do not write "discussed with patient". Specify tel # if discussed over the phone) Disposition Plan: home after two midnights (specify when and where you expect patient to be discharged) Consults called: Dr. Laural Golden for GI has been notified (with names) Admission status:  inpatient (inpatient / obs / tele / medical floor / SDU)   Adella Hare MD [facility] Pager [phone]  If 7PM-7AM, please contact night-coverage [email or web address] Password [facility]  11/05/2019, 3:51 PM

## 2019-11-05 NOTE — ED Triage Notes (Signed)
Pt presents to ED with complaints of dark red blood in stool since yesterday.

## 2019-11-05 NOTE — Consult Note (Signed)
Referring Provider: Dr. Noemi Chapel  Primary Care Physician:  [name], MD Primary Gastroenterologist:  Dr. [name]   Date of Admission: 11/05/2019 Date of Consultation: 11/05/2019  Reason for Consultation:  GI bleed  HPI:  Colin Wagner is a 69 y.o. year old male with history of extensive head and neck cancer. 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 Underwent radiation. G-tube in place but is able to eat and drink orally. Remote history of PUD in 1999, with surveillance EGD 2001 showing gastric ulcer healed. Last EGD in 2005 with gastritis. Hgb 10 today in ED. Prior trend reviewed, with Hgb 13.5 in Dec 2020. Prior to this, Hgb in the 11/12 range. On plavix and aspirin as outpatient.   Patient states yesterday he had two episodes of burgundy to black stool. No further bleeding today. No abdominal pain. States he felt constipated last week but doing better. No N/V. Has PEG tube for main feedings, taking 6 cans a day. He is able to eat by mouth softer foods as desired but main source is PEG. He has been taking BC powders 1-2 times per week and takes alka seltzer daily, stating he has lots of mucus. He has been followed by Speech therapy due to moderate to severe oral and moderate pharyngeal dysphagia following extensive head and neck surgery. 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 He underwent left lip revision Nov 2020. On pantoprazole as outpatient.    Past Medical History:  Diagnosis Date  . Alcohol abuse   . Alcoholic pancreatitis 3151   admission  . Chronic back pain   . Chronic pancreatitis ([place])    based on ct findings 2016  . COPD (chronic obstructive pulmonary disease) ([place])   . Diabetes mellitus    type 2  . Diverticulosis   . Gastritis   . GERD  (gastroesophageal reflux disease)   . Headache   . History of radiation therapy 05/12/17- 06/22/17   Left cheek and bilateral neck/ 60 Gy in 30 fractions to gross disease  . Hyperlipidemia   . Hypertension   . 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 Peptic ulcer disease 1999   Per medical reports, no H pylori  . Pneumonia   . Renal cancer, left ([place]) 2012   he tells me that he has been released, ?and that he is free of cancer, and never had it to begin with.   . Right shoulder pain   . Testicular hypofunction     Past Surgical History:  Procedure Laterality Date  . COLONOSCOPY  2003   Dr. [name], polyps  . COLONOSCOPY  2005   Dr. [name], multiple diverticula  . COLONOSCOPY  2008   Dr. [name], diverticulosis  . COLONOSCOPY WITH PROPOFOL N/A 10/21/2016   Dr. [name]: Diverticulosis, two 5-7 mm polyps removed. path-tubular adenomas.  Next colonoscopy in 5 years.  . ESOPHAGOGASTRODUODENOSCOPY     Multiple EGDs. 1999 EGD showed gastric ulcers, no H pylori and benign biopsies performed by Dr. [name]. 2001 gastric ulcer healed. Last EGD 2005 had gastritis.  . IR REPLACE G-TUBE SIMPLE WO FLUORO  03/20/2019  . IR REPLACE G-TUBE SIMPLE WO FLUORO  09/21/2019  . IR REPLC GASTRO/COLONIC TUBE PERCUT W/FLUORO  05/16/2018  . Left partial mandibulectomy, Scapular free flap reconstruction,  selective neck dissection, tracheotomy, and resection of intraoral palate cancer. Left 03/01/2017   [facility]  . LUNG BIOPSY    . MOUTH SURGERY    . PARTIAL NEPHRECTOMY Left 2012  . POLYPECTOMY  10/21/2016   Procedure: POLYPECTOMY;  Surgeon: [name], MD;  Location: [facility];  Service: Endoscopy;;  hepatic flexure x2  . TRANSURETHRAL RESECTION OF PROSTATE N/A 03/01/2019   Procedure: TRANSURETHRAL RESECTION OF THE PROSTATE (TURP)WITH CYSTOSCOPY;  Surgeon: Raynelle Bring, MD;  Location: [facility] OR[facility];  Service: Urology;  Laterality: N/A;    Prior to  Admission medications   Medication Sig Start Date End Date Taking? Authorizing Provider  aspirin EC 81 MG tablet Take 81 mg by mouth daily.   Yes [provider]  atorvastatin (LIPITOR) 40 MG tablet Take 40 mg by mouth daily at 6 PM.  02/14/17  Yes [provider]  cetirizine (ZYRTEC) 10 MG tablet Take 10 mg by mouth daily.   Yes [provider]  clopidogrel (PLAVIX) 75 MG tablet Take 75 mg by mouth daily. 05/27/19  Yes [provider]  cyclobenzaprine (FLEXERIL) 10 MG tablet Take 10 mg by mouth 2 (two) times a day.  09/06/18  Yes [provider]  fluticasone (FLONASE) 50 MCG/ACT nasal spray Place 2 sprays into both nostrils daily.  08/04/18  Yes [provider]  glipiZIDE (GLUCOTROL XL) 2.5 MG 24 hr tablet Take 2.5 mg by mouth every morning. 08/11/18  Yes [provider]  hydrochlorothiazide (HYDRODIURIL) 12.5 MG tablet Take 12.5 mg by mouth daily. 04/24/19  Yes [provider]  HYDROcodone-acetaminophen (NORCO) 7.5-325 MG tablet Take 1 tablet by mouth every 6 (six) hours as needed (pain). Patient taking differently: Take 1 tablet by mouth every 6 (six) hours as needed for moderate pain.  01/12/17  Yes [name], MD  LEVEMIR FLEXTOUCH 100 UNIT/ML Pen Inject 40 Units into the skin at bedtime. X 6 more nights, then return back to 22 units at bedtime 07/31/19  Yes Tat, David, MD  lidocaine (XYLOCAINE) 2 % solution Patient: Mix 1part 2% viscous lidocaine, 1part H20. Swish &/or swallow 58mL of diluted mixture, 23min before meals and at bedtime, up to QID Patient taking differently: Use as directed 5 mLs in the mouth or throat 3 (three) times daily as needed for mouth pain. Patient: Mix 1part 2% viscous lidocaine, 1part H20. Swish &/or swallow. 04/14/18  Yes [name], MD  LORazepam (ATIVAN) 1 MG tablet Take 1 mg by mouth 4 (four) times daily as needed for anxiety.  03/31/15  Yes [provider]  Nutritional Supplements  (GLUCERNA 1.5 CAL PO) 480 mLs by PEG Tube route 3 (three) times daily. 120 ml water before 29ml water after   Yes [provider]  pantoprazole (PROTONIX) 40 MG tablet Take 40 mg by mouth daily.    Yes [provider]  senna (SENOKOT) 8.6 MG TABS tablet Take 1 tablet (8.6 mg total) by mouth at bedtime as needed for mild constipation. May be needed as pain medication can constipate. 01/12/17  Yes [name], MD  traZODone (DESYREL) 50 MG tablet Take 100 mg by mouth at bedtime.    Yes [provider]  Water For Irrigation, Sterile (FREE WATER) SOLN Place 100 mLs into feeding tube every 8 (eight) hours. 04/22/17  Yes Tat, Shanon Brow, MD  metFORMIN (GLUCOPHAGE-XR) 500 MG 24 hr tablet Take 500-1,000 mg by mouth See admin instructions. Take 1000 mg by mouth in the morning, take  500 mg by mouth in the afternoon and 500 mg by mouth in the evening 12/27/17   [provider]  phenazopyridine (PYRIDIUM) 200 MG tablet Take 1 tablet (200 mg total) by mouth 3 (three) times daily as needed for pain (burning with urination). Patient not taking: Reported on 11/05/2019 03/01/19   Raynelle Bring, MD  predniSONE (DELTASONE) 20 MG tablet Take 2 tablets (40 mg total) by mouth 2 (two) times daily with a meal. X 6 days Patient not taking: Reported on 11/05/2019 07/31/19   Orson Eva, MD    Current Facility-Administered Medications  Medication Dose Route Frequency Provider Last Rate Last Admin  . 0.9 %  sodium chloride infusion   Intravenous Continuous Norins, Heinz Knuckles, MD      . atorvastatin (LIPITOR) tablet 40 mg  40 mg Oral q1800 Norins, Heinz Knuckles, MD      . cyclobenzaprine (FLEXERIL) tablet 10 mg  10 mg Oral BID Norins, Heinz Knuckles, MD      . feeding supplement (GLUCERNA 1.5 CAL) liquid 480 mL  480 mL Per Tube TID Norins, Heinz Knuckles, MD      . fluticasone (FLONASE) 50 MCG/ACT nasal spray 2 spray  2 spray Each Nare Daily Norins, Heinz Knuckles, MD      . free water 100 mL  100 mL Per Tube Q8H Norins,  Heinz Knuckles, MD      . hydrochlorothiazide (HYDRODIURIL) tablet 12.5 mg  12.5 mg Oral Daily Norins, Heinz Knuckles, MD      . HYDROcodone-acetaminophen (NORCO) 7.5-325 MG per tablet 1 tablet  1 tablet Oral Q6H PRN Norins, Heinz Knuckles, MD      . insulin aspart (novoLOG) injection 0-15 Units  0-15 Units Subcutaneous TID WC Norins, Heinz Knuckles, MD      . insulin detemir (LEVEMIR) injection 40 Units  40 Units Subcutaneous QHS Norins, Michael E, MD      . lidocaine (XYLOCAINE) 2 % viscous mouth solution 5 mL  5 mL Mouth/Throat TID PRN Norins, Heinz Knuckles, MD      . loratadine (CLARITIN) tablet 10 mg  10 mg Oral Daily Norins, Heinz Knuckles, MD      . LORazepam (ATIVAN) tablet 1 mg  1 mg Oral QID PRN Norins, Heinz Knuckles, MD      . pantoprazole (PROTONIX) EC tablet 40 mg  40 mg Oral Daily Norins, Heinz Knuckles, MD      . senna (SENOKOT) tablet 8.6 mg  1 tablet Oral QHS PRN Norins, Heinz Knuckles, MD      . traZODone (DESYREL) tablet 100 mg  100 mg Oral QHS Norins, Heinz Knuckles, MD       Current Outpatient Medications  Medication Sig Dispense Refill  . aspirin EC 81 MG tablet Take 81 mg by mouth daily.    Marland Kitchen atorvastatin (LIPITOR) 40 MG tablet Take 40 mg by mouth daily at 6 PM.     . cetirizine (ZYRTEC) 10 MG tablet Take 10 mg by mouth daily.    . clopidogrel (PLAVIX) 75 MG tablet Take 75 mg by mouth daily.    . cyclobenzaprine (FLEXERIL) 10 MG tablet Take 10 mg by mouth 2 (two) times a day.     . fluticasone (FLONASE) 50 MCG/ACT nasal spray Place 2 sprays into both nostrils daily.     Marland Kitchen glipiZIDE (GLUCOTROL XL) 2.5 MG 24 hr tablet Take 2.5 mg by mouth every morning.    . hydrochlorothiazide (HYDRODIURIL) 12.5 MG tablet Take 12.5 mg by mouth daily.    Marland Kitchen  HYDROcodone-acetaminophen (NORCO) 7.5-325 MG tablet Take 1 tablet by mouth every 6 (six) hours as needed (pain). (Patient taking differently: Take 1 tablet by mouth every 6 (six) hours as needed for moderate pain. ) 60 tablet 0  . LEVEMIR FLEXTOUCH 100 UNIT/ML Pen Inject 40 Units into  the skin at bedtime. X 6 more nights, then return back to 22 units at bedtime 15 mL 11  . lidocaine (XYLOCAINE) 2 % solution Patient: Mix 1part 2% viscous lidocaine, 1part H20. Swish &/or swallow 30mL of diluted mixture, 62min before meals and at bedtime, up to QID (Patient taking differently: Use as directed 5 mLs in the mouth or throat 3 (three) times daily as needed for mouth pain. Patient: Mix 1part 2% viscous lidocaine, 1part H20. Swish &/or swallow.) 100 mL 5  . LORazepam (ATIVAN) 1 MG tablet Take 1 mg by mouth 4 (four) times daily as needed for anxiety.   5  . Nutritional Supplements (GLUCERNA 1.5 CAL PO) 480 mLs by PEG Tube route 3 (three) times daily. 120 ml water before 214ml water after    . pantoprazole (PROTONIX) 40 MG tablet Take 40 mg by mouth daily.     Marland Kitchen senna (SENOKOT) 8.6 MG TABS tablet Take 1 tablet (8.6 mg total) by mouth at bedtime as needed for mild constipation. May be needed as pain medication can constipate. 120 each 1  . traZODone (DESYREL) 50 MG tablet Take 100 mg by mouth at bedtime.     . Water For Irrigation, Sterile (FREE WATER) SOLN Place 100 mLs into feeding tube every 8 (eight) hours. 1000 mL 0  . metFORMIN (GLUCOPHAGE-XR) 500 MG 24 hr tablet Take 500-1,000 mg by mouth See admin instructions. Take 1000 mg by mouth in the morning, take 500 mg by mouth in the afternoon and 500 mg by mouth in the evening  3  . phenazopyridine (PYRIDIUM) 200 MG tablet Take 1 tablet (200 mg total) by mouth 3 (three) times daily as needed for pain (burning with urination). (Patient not taking: Reported on 11/05/2019) 20 tablet 0  . predniSONE (DELTASONE) 20 MG tablet Take 2 tablets (40 mg total) by mouth 2 (two) times daily with a meal. X 6 days (Patient not taking: Reported on 11/05/2019) 24 tablet 0    Allergies as of 11/05/2019  . (No Known Allergies)    Family History  Problem Relation Age of Onset  . Hypertension Mother   . Colon cancer Neg Hx     Social History    Socioeconomic History  . Marital status: Divorced    Spouse name: Not on file  . Number of children: 0  . Years of education: Not on file  . Highest education level: Not on file  Occupational History  . Not on file  Tobacco Use  . Smoking status: Former Smoker    Packs/day: 0.50    Years: 35.00    Pack years: 17.50  . Smokeless tobacco: Never Used  . Tobacco comment: quit 02-2017  Substance and Sexual Activity  . Alcohol use: No    Comment: quit 2000 but relapse in 2016. no etoh since hospitalized 2016.   . Drug use: No  . Sexual activity: Not Currently  Other Topics Concern  . 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   Patient with a history of smoking half pack a day for 35+ years. Patient currently only smoking a few cigarettes per day.  The patient has history of alcohol abuse. Patient quit in 2000 and then relapsed in 2016. Patient has not had any alcohol since 2016.   Social Determinants of Health   Financial Resource Strain: Low Risk   . Difficulty of Paying Living Expenses: Not hard at all  Food Insecurity: No Food Insecurity  . Worried About Charity fundraiser in the Last Year: Never true  . Ran Out of Food in the Last Year: Never true  Transportation Needs:   . Lack of Transportation (Medical):   Marland Kitchen Lack of Transportation (Non-Medical):   Physical Activity: Inactive  . Days of Exercise per Week: 0 days  . Minutes of Exercise per Session: 0 min  Stress: No Stress Concern Present  . Feeling of Stress : Not at all  Social Connections: Unknown  . Frequency of Communication with Friends and Family: More than three times a week  . Frequency of Social Gatherings with Friends and Family: More than three times a week  . Attends Religious Services: More than 4 times per year  . Active Member of Clubs or Organizations: Yes  . Attends Archivist Meetings: More than 4 times per year  . Marital  Status: Not on file  Intimate Partner Violence:   . Fear of Current or Ex-Partner:   . Emotionally Abused:   Marland Kitchen Physically Abused:   . Sexually Abused:     Review of Systems: Gen: Denies fever, chills, loss of appetite, change in weight or weight loss CV: Denies chest pain, heart palpitations, syncope, edema  Resp: Denies shortness of breath with rest, cough, wheezing GI: see HPI  GU : Denies urinary burning, urinary frequency, urinary incontinence.  MS: Denies joint pain,swelling, cramping Derm: Denies rash, itching, dry skin Psych: Denies depression, anxiety,confusion, or memory loss Heme: see HPI  Physical Exam: Vital signs in last 24 hours: Temp:  [98 F (36.7 C)] 98 F (36.7 C) (03/15 1341) Pulse Rate:  [72-87] 72 (03/15 1530) Resp:  [18] 18 (03/15 1341) BP: (97-110)/(60-78) 99/60 (03/15 1530) SpO2:  [97 %-99 %] 98 % (03/15 1530) Weight:  [83.5 kg] 83.5 kg (03/15 1339)   General:   Alert,  Well-developed, well-nourished, pleasant and cooperative in NAD Head:  Normocephalic and atraumatic. Eyes:  Sclera clear, no icterus.    Ears:  Normal auditory acuity. Mouth:  Scar left lip, left side of face more prominent than right, somewhat garbled speech but able to understand. Able to open mouth fully Lungs:  Clear throughout to auscultation.   Heart:  S1 S2 present without murmurs Abdomen:  Soft, nontender and nondistended. No masses, hepatosplenomegaly or hernias noted. Normal bowel sounds, without guarding, and without rebound. PEG tube in LUQ intact.  Rectal:  Deferred  Msk:  Symmetrical without gross deformities. Normal posture. Extremities:  Without  edema. Neurologic:  Alert and  oriented x4 Psych:  Alert and cooperative. Normal mood and affect.  Intake/Output from previous day: No intake/output data recorded. Intake/Output this shift: No intake/output data recorded.  Lab Results: Recent Labs    11/05/19 1419  WBC 7.2  HGB 10.0*  HCT 32.1*  PLT 220    BMET Recent Labs    11/05/19 1419  NA 137  K 4.7  CL 103  CO2 28  GLUCOSE 166*  BUN 47*  CREATININE 1.61*  CALCIUM 8.9   LFT Recent Labs    11/05/19 1419  PROT 6.3*  ALBUMIN 3.5  AST 18  ALT 16  ALKPHOS 60  BILITOT  0.6   PT/INR Recent Labs    11/05/19 1419  LABPROT 13.7  INR 1.1     Impression: Very pleasant 69 year old male with history of extensive head and neck cancer diagnosed in 2018, s/p resection as noted in HPI and undergoing radiation, now with PEG tube in place for main nutrition but able to tolerate liquids and soft foods by mouth as desired, presenting with reports of dark burgundy to black stool yesterday on 2 occasions. Hgb appears to be lower than his baseline, currently 10 on admission and previously 13 in Dec 2020. He endorses BC powder use and alka seltzer on a routine basis. Remote history of PUD dating back over 20 years ago, with last EGD reportedly in 2005 with gastritis. In setting of Plavix and aspirin use with ongoing NSAIDs, concern for UGI source but unable to rule out any NSAID-induced injury elsewhere in GI tract. Clinically does not seem consistent with diverticular bleed. Would favor pursuing EGD if appropriate from anesthesia standpoint first; colonoscopy fairly recent as of 2018 with diverticulosis and adenomas.   Will allow clears for now. NPO after midnight with reassessment in the morning. Continue to trend H/H.    Plan: PPI IV BID NPO after midnight Clears for now Continue to trend H/H Reassess in the morning Discussed with patient endoscopic evaluation tomorrow, 11/06/19. Discussed risks and benefits of EGD with stated understanding. Would recommend Propofol.    Annitta Needs, PhD, ANP-BC [facility]     LOS: 0 days    11/05/2019, 4:42 PM

## 2019-11-05 NOTE — ED Provider Notes (Signed)
[place] Provider Note   CSN: [id number] Arrival date & time: 11/05/19  1329     History Chief Complaint  Patient presents with  . Rectal Bleeding    Colin Wagner is a 69 y.o. male.  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  He still uses the G-tube once a day but is able to eat and drink.  He has chronic alcoholic pancreatitis, chronic alcohol abuse and has had a history of gastrointestinal bleeding after using excessive amounts of BC powders in the past.  He is followed by Dr. Laural Golden but according to the patient he does not follow very closely with him.  He does have a history of COPD but no longer smokes cigarettes.  The patient reports that yesterday he had 2 episodes of dark blood in his stools, he has not yet had a stool today but wanted to come get checked out.  He denies lightheadedness, shortness of breath, weakness and has no difficulty urinating.  He states he still takes the occasional BC powder  He does take Plavix  His symptoms are intermittent, nothing seems to make it better or worse, not associated with any lightheadedness syncope or shortness of breath.  Past Medical History:  Diagnosis Date  . Alcohol abuse   . Alcoholic pancreatitis 2671   admission  . Chronic back pain   . Chronic pancreatitis ([place])    based on ct findings 2016  . COPD (chronic obstructive pulmonary disease) ([place])   . Diabetes mellitus    type 2  . Diverticulosis   . Gastritis   . GERD (gastroesophageal reflux disease)   . Headache   . History of radiation therapy 05/12/17- 06/22/17   Left cheek and bilateral neck/ 60 Gy in 30 fractions to gross disease  . Hyperlipidemia   . Hypertension   . Jaw cancer ([place])    left jaw part of jaw bone removed  . Peptic ulcer disease 1999   Per medical reports, no H pylori  . Pneumonia   . Renal cancer, left ([place]) 2012   he  tells me that he has been released, ?and that he is free of cancer, and never had it to begin with.   . Right shoulder pain   . Testicular hypofunction     Patient Active Problem List   Diagnosis Date Noted  . Acute respiratory failure with hypoxia ([place]) 07/29/2019  . Pneumonia due to COVID-19 virus 07/28/2019  . AKI (acute kidney injury) ([place]) 07/27/2019  . Hyperkalemia 07/27/2019  . COVID-19 virus infection 07/27/2019  . Urinary retention due to benign prostatic hyperplasia 03/01/2019  . Attention to G-tube ([place]) 02/08/2018  . UTI (urinary tract infection) 04/20/2017  . UTI due to Klebsiella species 04/20/2017  . Essential hypertension 04/20/2017  . Chronic pancreatitis ([place]) 04/20/2017  . 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 Malnutrition of moderate degree 04/20/2017  . Complicated UTI (urinary tract infection)   . 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 Rectal bleeding 09/28/2016  . Hyponatremia 04/28/2015  . Abdominal pain 04/27/2015  . Acute pancreatitis 04/27/2015  . Chronic alcoholic pancreatitis ([place]) 04/27/2015  . ETOH abuse 04/27/2015  . DM type 2 (diabetes mellitus, type 2) ([place]) 04/27/2015  . Hypertension, uncontrolled 04/27/2015  . Hyperlipidemia 04/27/2015  . Hypertensive urgency 04/27/2015  . Alcohol abuse     Past Surgical  History:  Procedure Laterality Date  . COLONOSCOPY  2003   Dr. [name], polyps  . COLONOSCOPY  2005   Dr. [name], multiple diverticula  . COLONOSCOPY  2008   Dr. [name], diverticulosis  . COLONOSCOPY WITH PROPOFOL N/A 10/21/2016   Dr. [name]: Diverticulosis, two 5-7 mm polyps removed. path-tubular adenomas.  Next colonoscopy in 5 years.  . ESOPHAGOGASTRODUODENOSCOPY     Multiple EGDs. 1999 EGD showed gastric ulcers, no H pylori and benign biopsies performed by Dr. [name]. 2001 gastric ulcer healed. Last EGD 2005 had gastritis.  . IR REPLACE G-TUBE SIMPLE WO FLUORO  03/20/2019  . IR REPLACE G-TUBE  SIMPLE WO FLUORO  09/21/2019  . IR REPLC GASTRO/COLONIC TUBE PERCUT W/FLUORO  05/16/2018  . Left partial mandibulectomy, Scapular free flap reconstruction, selective neck dissection, tracheotomy, and resection of intraoral palate cancer. Left 03/01/2017   [facility]  . LUNG BIOPSY    . MOUTH SURGERY    . PARTIAL NEPHRECTOMY Left 2012  . POLYPECTOMY  10/21/2016   Procedure: POLYPECTOMY;  Surgeon: [name], MD;  Location: [facility];  Service: Endoscopy;;  hepatic flexure x2  . TRANSURETHRAL RESECTION OF PROSTATE N/A 03/01/2019   Procedure: TRANSURETHRAL RESECTION OF THE PROSTATE (TURP)WITH CYSTOSCOPY;  Surgeon: Raynelle Bring, MD;  Location: [facility] ORS;  Service: Urology;  Laterality: N/A;       Family History  Problem Relation Age of Onset  . Hypertension Mother   . Colon cancer Neg Hx     Social History   Tobacco Use  . Smoking status: Former Smoker    Packs/day: 0.50    Years: 35.00    Pack years: 17.50  . Smokeless tobacco: Never Used  . Tobacco comment: quit 02-2017  Substance Use Topics  . Alcohol use: No    Comment: quit 2000 but relapse in 2016. no etoh since hospitalized 2016.   . Drug use: No    Home Medications Prior to Admission medications   Medication Sig Start Date End Date Taking? Authorizing Provider  aspirin EC 81 MG tablet Take 81 mg by mouth daily.   Yes [provider]  atorvastatin (LIPITOR) 40 MG tablet Take 40 mg by mouth daily at 6 PM.  02/14/17  Yes [provider]  cetirizine (ZYRTEC) 10 MG tablet Take 10 mg by mouth daily.   Yes [provider]  clopidogrel (PLAVIX) 75 MG tablet Take 75 mg by mouth daily. 05/27/19  Yes [provider]  cyclobenzaprine (FLEXERIL) 10 MG tablet Take 10 mg by mouth 2 (two) times a day.  09/06/18  Yes [provider]  fluticasone (FLONASE) 50 MCG/ACT nasal spray Place 2 sprays into both nostrils daily.  08/04/18  Yes [provider]  glipiZIDE  (GLUCOTROL XL) 2.5 MG 24 hr tablet Take 2.5 mg by mouth every morning. 08/11/18  Yes [provider]  hydrochlorothiazide (HYDRODIURIL) 12.5 MG tablet Take 12.5 mg by mouth daily. 04/24/19  Yes [provider]  HYDROcodone-acetaminophen (NORCO) 7.5-325 MG tablet Take 1 tablet by mouth every 6 (six) hours as needed (pain). Patient taking differently: Take 1 tablet by mouth every 6 (six) hours as needed for moderate pain.  01/12/17  Yes [name], MD  LEVEMIR FLEXTOUCH 100 UNIT/ML Pen Inject 40 Units into the skin at bedtime. X 6 more nights, then return back to 22 units at bedtime 07/31/19  Yes Tat, David, MD  lidocaine (XYLOCAINE) 2 % solution Patient: Mix 1part 2% viscous lidocaine,  1part H20. Swish &/or swallow 41mL of diluted mixture, 33min before meals and at bedtime, up to QID Patient taking differently: Use as directed 5 mLs in the mouth or throat 3 (three) times daily as needed for mouth pain. Patient: Mix 1part 2% viscous lidocaine, 1part H20. Swish &/or swallow. 04/14/18  Yes [name], MD  LORazepam (ATIVAN) 1 MG tablet Take 1 mg by mouth 4 (four) times daily as needed for anxiety.  03/31/15  Yes [provider]  Nutritional Supplements (GLUCERNA 1.5 CAL PO) 480 mLs by PEG Tube route 3 (three) times daily. 120 ml water before 276ml water after   Yes [provider]  pantoprazole (PROTONIX) 40 MG tablet Take 40 mg by mouth daily.    Yes [provider]  senna (SENOKOT) 8.6 MG TABS tablet Take 1 tablet (8.6 mg total) by mouth at bedtime as needed for mild constipation. May be needed as pain medication can constipate. 01/12/17  Yes [name], MD  traZODone (DESYREL) 50 MG tablet Take 100 mg by mouth at bedtime.    Yes [provider]  Water For Irrigation, Sterile (FREE WATER) SOLN Place 100 mLs into feeding tube every 8 (eight) hours. 04/22/17  Yes Tat, Shanon Brow, MD  metFORMIN (GLUCOPHAGE-XR) 500 MG 24 hr tablet Take 500-1,000 mg by mouth See  admin instructions. Take 1000 mg by mouth in the morning, take 500 mg by mouth in the afternoon and 500 mg by mouth in the evening 12/27/17   [provider]  phenazopyridine (PYRIDIUM) 200 MG tablet Take 1 tablet (200 mg total) by mouth 3 (three) times daily as needed for pain (burning with urination). Patient not taking: Reported on 11/05/2019 03/01/19   Raynelle Bring, MD  predniSONE (DELTASONE) 20 MG tablet Take 2 tablets (40 mg total) by mouth 2 (two) times daily with a meal. X 6 days Patient not taking: Reported on 11/05/2019 07/31/19   Orson Eva, MD    Allergies    Patient has no known allergies.  Review of Systems   Review of Systems  All other systems reviewed and are negative.   Physical Exam Updated Vital Signs BP 100/78   Pulse 85   Temp 98 F (36.7 C) (Oral)   Resp 18   Ht 1.803 m (5\' 11" )   Wt 83.5 kg   SpO2 97%   BMI 25.66 kg/m   Physical Exam Vitals and nursing note reviewed.  Constitutional:      General: He is not in acute distress.    Appearance: He is well-developed.  HENT:     Head: Normocephalic and atraumatic.     Mouth/Throat:     Pharynx: No oropharyngeal exudate.     Comments: Jaw deformity but able to open and close the mouth, voice is slightly off secondary to prior surgery, no distress, moist mucous membranes Eyes:     General: No scleral icterus.       Right eye: No discharge.        Left eye: No discharge.     Conjunctiva/sclera: Conjunctivae normal.     Pupils: Pupils are equal, round, and reactive to light.  Neck:     Thyroid: No thyromegaly.     Vascular: No JVD.  Cardiovascular:     Rate and Rhythm: Normal rate and regular rhythm.     Heart sounds: Normal heart sounds. No murmur. No friction rub. No gallop.   Pulmonary:     Effort: Pulmonary effort is normal. No respiratory distress.  Breath sounds: Normal breath sounds. No wheezing or rales.  Abdominal:     General: Bowel sounds are normal. There is no distension.      Palpations: Abdomen is soft. There is no mass.     Tenderness: There is no abdominal tenderness.     Comments: Soft nontender abdomen, gastrostomy tube in the upper abdomen without any blood, no tenderness, normal bowel sounds  Genitourinary:    Comments: See anoscope exam - no hemorrhoids or fissures - occult blood present on testing - dark brown to dark red in color. Musculoskeletal:        General: No tenderness. Normal range of motion.     Cervical back: Normal range of motion and neck supple.  Lymphadenopathy:     Cervical: No cervical adenopathy.  Skin:    General: Skin is warm and dry.     Findings: No erythema or rash.  Neurological:     Mental Status: He is alert.     Coordination: Coordination normal.  Psychiatric:        Behavior: Behavior normal.     ED Results / Procedures / Treatments   Labs (all labs ordered are listed, but only abnormal results are displayed) Labs Reviewed  CBC WITH DIFFERENTIAL/PLATELET - Abnormal; Notable for the following components:      Result Value   RBC 3.82 (*)    Hemoglobin 10.0 (*)    HCT 32.1 (*)    RDW 18.1 (*)    Lymphs Abs 0.6 (*)    All other components within normal limits  COMPREHENSIVE METABOLIC PANEL - Abnormal; Notable for the following components:   Glucose, Bld 166 (*)    BUN 47 (*)    Creatinine, Ser 1.61 (*)    Total Protein 6.3 (*)    GFR calc non Af Amer 43 (*)    GFR calc Af Amer 50 (*)    All other components within normal limits  POC OCCULT BLOOD, ED - Abnormal; Notable for the following components:   Fecal Occult Bld POSITIVE (*)    All other components within normal limits  SARS CORONAVIRUS 2 (TAT 6-24 HRS)  PROTIME-INR  TYPE AND SCREEN    EKG None  Radiology No results found.  Procedures LOWER ENDOSCOPY  Date/Time: 11/05/2019 1:59 PM Performed by: Noemi Chapel, MD Authorized by: Noemi Chapel, MD  Consent: Verbal consent obtained. Risks and benefits: risks, benefits and alternatives were  discussed Consent given by: patient Patient understanding: patient states understanding of the procedure being performed Required items: required blood products, implants, devices, and special equipment available Patient identity confirmed: verbally with patient Time out: Immediately prior to procedure a "time out" was called to verify the correct patient, procedure, equipment, support staff and site/side marked as required. Indications: rectal bleeding  Sedation: Patient sedated: no  Preparation: Patient was prepped and draped in the usual sterile fashion. Scope type: anoscope External exam performed: yes Negative external exam findings: no pilonidal cyst, no perirectal warts, no perianal maceration, no perianal induration, no perianal erythema and no external hemorrhoids Digital exam performed: yes Positive digital exam findings: occult blood in stool Negative digital exam findings: no laxity of anal sphincter Negative internal exam findings: no internal hemorrhoid, no inflammation, no anal fissures, no anal fistulae, no anal stricture and no abscess Procedure termination: procedure complete Patient tolerance: patient tolerated the procedure well with no immediate complications Comments: There is no visualized acute bleeding, hemorrhoidal bleeding, or fissures.       (including critical  care time)  Medications Ordered in ED Medications - No data to display  ED Course  I have reviewed the triage vital signs and the nursing notes.  Pertinent labs & imaging results that were available during my care of the patient were reviewed by me and considered in my medical decision making (see chart for details).    MDM Rules/Calculators/A&P                      I will discussed the case with Dr. Laural Golden, the patient's gastroenterologist, he will likely need to be admitted to the hospital given his ongoing Plavix use with ongoing blood loss.  CBC pending  The patient has had a drop in  hemoglobin of approximately 3-1/2 g over the last 3 months based on my evaluation of the prior labs.  The patient's blood pressure is somewhere around 528 systolic, his pulse is 87, he will need to be admitted to the hospital for a worsening anemia in the presence of active GI bleeding.  He is not altered, I spoke with Dr. Laural Golden who will see the patient in consultation and request a clear liquid diet, he does not anticipate doing a procedure today  dw Dr. Linda Hedges who will admit Clear liquid diet  Colin Wagner was evaluated in Emergency Department on 11/05/2019 for the symptoms described in the history of present illness. He was evaluated in the context of the global COVID-19 pandemic, which necessitated consideration that the patient might be at risk for infection with the SARS-CoV-2 virus that causes COVID-19. Institutional protocols and algorithms that pertain to the evaluation of patients at risk for COVID-19 are in a state of rapid change based on information released by regulatory bodies including the CDC and federal and state organizations. These policies and algorithms were followed during the patient's care in the ED.   Final Clinical Impression(s) / ED Diagnoses Final diagnoses:  Rectal bleeding  Anemia, unspecified type    Rx / DC Orders ED Discharge Orders    None       Noemi Chapel, MD 11/05/19 [phone]

## 2019-11-06 DIAGNOSIS — K625 Hemorrhage of anus and rectum: Secondary | ICD-10-CM | POA: Diagnosis not present

## 2019-11-06 DIAGNOSIS — K921 Melena: Secondary | ICD-10-CM | POA: Diagnosis not present

## 2019-11-06 DIAGNOSIS — D649 Anemia, unspecified: Secondary | ICD-10-CM

## 2019-11-06 LAB — CBC
HCT: 31.6 % — ABNORMAL LOW (ref 39.0–52.0)
Hemoglobin: 9.8 g/dL — ABNORMAL LOW (ref 13.0–17.0)
MCH: 25.5 pg — ABNORMAL LOW (ref 26.0–34.0)
MCHC: 31 g/dL (ref 30.0–36.0)
MCV: 82.3 fL (ref 80.0–100.0)
Platelets: 230 10*3/uL (ref 150–400)
RBC: 3.84 MIL/uL — ABNORMAL LOW (ref 4.22–5.81)
RDW: 17.8 % — ABNORMAL HIGH (ref 11.5–15.5)
WBC: 6.9 10*3/uL (ref 4.0–10.5)
nRBC: 0 % (ref 0.0–0.2)

## 2019-11-06 LAB — SARS CORONAVIRUS 2 (TAT 6-24 HRS): SARS Coronavirus 2: NEGATIVE

## 2019-11-06 LAB — GLUCOSE, CAPILLARY
Glucose-Capillary: 138 mg/dL — ABNORMAL HIGH (ref 70–99)
Glucose-Capillary: 60 mg/dL — ABNORMAL LOW (ref 70–99)
Glucose-Capillary: 77 mg/dL (ref 70–99)
Glucose-Capillary: 62 mg/dL — ABNORMAL LOW (ref 70–99)
Glucose-Capillary: 114 mg/dL — ABNORMAL HIGH (ref 70–99)
Glucose-Capillary: 225 mg/dL — ABNORMAL HIGH (ref 70–99)
Glucose-Capillary: 154 mg/dL — ABNORMAL HIGH (ref 70–99)

## 2019-11-06 MED ORDER — DEXTROSE 50 % IV SOLN
INTRAVENOUS | Status: AC
  Filled 2019-11-06: qty 50

## 2019-11-06 MED ORDER — INSULIN DETEMIR 100 UNIT/ML ~~LOC~~ SOLN
20.0000 [IU] | Freq: Every day | SUBCUTANEOUS | Status: DC
  Administered 2019-11-06: 20 [IU] via SUBCUTANEOUS
  Filled 2019-11-06 (×3): qty 0.2

## 2019-11-06 MED ORDER — ORAL CARE MOUTH RINSE
15.0000 mL | Freq: Two times a day (BID) | OROMUCOSAL | Status: DC
  Administered 2019-11-06: 15 mL via OROMUCOSAL

## 2019-11-06 MED ORDER — CHLORHEXIDINE GLUCONATE 0.12 % MT SOLN
15.0000 mL | Freq: Two times a day (BID) | OROMUCOSAL | Status: DC
  Administered 2019-11-06 – 2019-11-07 (×4): 15 mL via OROMUCOSAL
  Filled 2019-11-06 (×4): qty 15

## 2019-11-06 MED ORDER — DEXTROSE 50 % IV SOLN
INTRAVENOUS | Status: AC
  Administered 2019-11-06: 12.5 g via INTRAVENOUS
  Filled 2019-11-06: qty 50

## 2019-11-06 MED ORDER — DEXTROSE 50 % IV SOLN: 12.5000 g | INTRAVENOUS | Status: DC

## 2019-11-06 MED ORDER — DEXTROSE 50 % IV SOLN: 12.5000 g | INTRAVENOUS | Status: AC

## 2019-11-06 MED ORDER — LABETALOL HCL 5 MG/ML IV SOLN: 10.0000 mg | INTRAVENOUS | Status: DC | PRN

## 2019-11-06 NOTE — Progress Notes (Addendum)
PROGRESS NOTE    Colin Wagner  [name]:[id number] DOB: 1951/02/06 DOA: 11/05/2019 PCP: [name], MD   Brief Narrative:  Per HPI: Colin Wagner is a 68 y.o. 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 He does have a long-term  gastrostomy tube which he still uses once a day but is able to eat and drink. He has chronic alcoholic pancreatitis, chronic alcohol abuse and has had a history of gastrointestinal bleeding after using excessive amounts of BC powders in the past. He is followed by Dr. Angela Cox according to the patient he does not follow very closely with him. He does have a history of COPD but no longer smokes cigarettes.  The patient reports that yesterday he had 2 episodes of dark blood in his stools, he has not yet had a stool today but wanted to come get checked out. He denies lightheadedness, shortness of breath, weakness and has no difficulty urinating. He states he still takes the occasional BC powder  He does take Plavix  His symptoms are intermittent, nothing seems to make it better or worse, not associated with any lightheadedness syncope or shortness of breath.  3/16: Patient was admitted with rectal bleeding and has stable hemoglobin levels noted this morning as well as stable vitals.  He has been seen by GI with recommendations for EGD, but unfortunately this cannot be performed at [facility] safely by anesthesia given his head and neck cancer and he will require fiberoptic intubation for which he will require transfer to [facility].  Assessment & Plan:   Active Problems:   DM type 2 (diabetes mellitus, type 2) (HCC)   Hypertension, uncontrolled   Rectal bleeding   Rectal bleed   Anemia   Rectal bleeding -Stable hemoglobin levels noted, no overt bleeding noted since admission -Appreciate GI evaluation, they have contacted [facility] Dr. Paulita Fujita who agrees to see patient once transferred to [facility]   Transfer for difficult intubation -Continue PPI twice daily -Continue n.p.o. for now -Monitor repeat H&H in a.m.  History of type 2 diabetes-controlled -Hemoglobin A1c 7% -Hold Metformin and glipizide -Continue SSI and Levemir at half dose while n.p.o.  History of hypertension-controlled -Hold home HCTZ for now while n.p.o. -Monitor closely  Dyslipidemia -Continue statin  History of extensive head and neck cancer status post resection and radiation -PEG tube in place for main source of nutrition, but can still tolerate liquids and soft foods by mouth -Requires transfer for difficult intubation anticipated  DVT prophylaxis: SCDs Code Status: Full Family Communication: Discussed with girlfriend regarding transfer Disposition Plan: To [facility] for further GI evaluation as patient is difficult intubation   Consultants:   GI  Discussed with Dr. Paulita Fujita with [facility] who is aware of transfer  Procedures:   None  Antimicrobials:   None   Subjective: Patient seen and evaluated today with no new acute complaints or concerns. No acute concerns or events noted overnight.  No bowel movements or further bleeding noted.  Objective: Vitals:   11/05/19 1856 11/05/19 2246 11/06/19 0257 11/06/19 0659  BP: 135/76 110/70 93/73 132/68  Pulse: 89 89 90 89  Resp:  16 16 16   Temp: 98 F (36.7 C) 98.3 F (36.8 C) 98.2 F (36.8 C) 98 F (36.7 C)  TempSrc: Oral Oral Oral Oral  SpO2: 99% 98% 94% 95%  Weight: 83 kg     Height: 5\' 11"  (1.803 m)  Intake/Output Summary (Last 24 hours) at 11/06/2019 1127 Last data filed at 11/06/2019 0757 Gross per 24 hour  Intake 810 ml  Output 1825 ml  Net -1015 ml   Filed Weights   11/05/19 1339 11/05/19 1856  Weight: 83.5 kg 83 kg    Examination:  General exam: Appears calm and comfortable  Respiratory system: Clear to auscultation. Respiratory effort normal. Cardiovascular system: S1 & S2 heard, RRR. No JVD, murmurs, rubs,  gallops or clicks. No pedal edema. Gastrointestinal system: Abdomen is nondistended, soft and nontender. No organomegaly or masses felt. Normal bowel sounds heard.  PEG tube present. Central nervous system: Alert and oriented. No focal neurological deficits. Extremities: Symmetric 5 x 5 power. Skin: No rashes, lesions or ulcers Psychiatry: Judgement and insight appear normal. Mood & affect appropriate.     Data Reviewed: I have personally reviewed following labs and imaging studies  CBC: Recent Labs  Lab 11/05/19 1419 11/06/19 0549  WBC 7.2 6.9  NEUTROABS 5.8  --   HGB 10.0* 9.8*  HCT 32.1* 31.6*  MCV 84.0 82.3  PLT 220 093   Basic Metabolic Panel: Recent Labs  Lab 11/05/19 1419  NA 137  K 4.7  CL 103  CO2 28  GLUCOSE 166*  BUN 47*  CREATININE 1.61*  CALCIUM 8.9   GFR: Estimated Creatinine Clearance: 46.1 mL/min (A) (by C-G formula based on SCr of 1.61 mg/dL (H)). Liver Function Tests: Recent Labs  Lab 11/05/19 1419  AST 18  ALT 16  ALKPHOS 60  BILITOT 0.6  PROT 6.3*  ALBUMIN 3.5   No results for input(s): LIPASE, AMYLASE in the last 168 hours. No results for input(s): AMMONIA in the last 168 hours. Coagulation Profile: Recent Labs  Lab 11/05/19 1419  INR 1.1   Cardiac Enzymes: No results for input(s): CKTOTAL, CKMB, CKMBINDEX, TROPONINI in the last 168 hours. BNP (last 3 results) No results for input(s): PROBNP in the last 8760 hours. HbA1C: Recent Labs    11/05/19 1419  HGBA1C 7.0*   CBG: Recent Labs  Lab 11/05/19 1714 11/05/19 2130 11/06/19 0745 11/06/19 0807 11/06/19 1025  GLUCAP 131* 115* 60* 138* 77   Lipid Profile: No results for input(s): CHOL, HDL, LDLCALC, TRIG, CHOLHDL, LDLDIRECT in the last 72 hours. Thyroid Function Tests: No results for input(s): TSH, T4TOTAL, FREET4, T3FREE, THYROIDAB in the last 72 hours. Anemia Panel: No results for input(s): VITAMINB12, FOLATE, FERRITIN, TIBC, IRON, RETICCTPCT in the last 72  hours. Sepsis Labs: No results for input(s): PROCALCITON, LATICACIDVEN in the last 168 hours.  Recent Results (from the past 240 hour(s))  SARS CORONAVIRUS 2 (TAT 6-24 HRS) Nasopharyngeal Nasopharyngeal Swab     Status: None   Collection Time: 11/05/19  3:40 PM   Specimen: Nasopharyngeal Swab  Result Value Ref Range Status   SARS Coronavirus 2 NEGATIVE NEGATIVE Final    Comment: (NOTE) SARS-CoV-2 target nucleic acids are NOT DETECTED. The SARS-CoV-2 RNA is generally detectable in upper and lower respiratory specimens during the acute phase of infection. Negative results do not preclude SARS-CoV-2 infection, do not rule out co-infections with other pathogens, and should not be used as the sole basis for treatment or other patient management decisions. Negative results must be combined with clinical observations, patient history, and epidemiological information. The expected result is Negative. Fact Sheet for Patients: SugarRoll.be Fact Sheet for Healthcare Providers: https://www.woods-mathews.com/ This test is not yet approved or cleared by the [place] FDA and  has been authorized for detection and/or diagnosis of  SARS-CoV-2 by FDA under an Emergency Use Authorization (EUA). This EUA will remain  in effect (meaning this test can be used) for the duration of the COVID-19 declaration under Section 56 4(b)(1) of the Act, 21 U.S.C. section 360bbb-3(b)(1), unless the authorization is terminated or revoked sooner. Performed at [facility], [place] [address], [place],  [address]          Radiology Studies: No results found.      Scheduled Meds: . atorvastatin  40 mg Oral q1800  . chlorhexidine  15 mL Mouth Rinse BID  . cyclobenzaprine  10 mg Oral BID  . feeding supplement (GLUCERNA 1.5 CAL)  480 mL Per Tube TID  . fluticasone  2 spray Each Nare Daily  . free water  100 mL Per Tube Q8H  . hydrochlorothiazide  12.5  mg Oral Daily  . insulin aspart  0-15 Units Subcutaneous TID WC  . insulin detemir  40 Units Subcutaneous QHS  . loratadine  10 mg Oral Daily  . mouth rinse  15 mL Mouth Rinse q12n4p  . pantoprazole (PROTONIX) IV  40 mg Intravenous Q12H  . traZODone  100 mg Oral QHS   Continuous Infusions: . sodium chloride 50 mL/hr at 11/05/19 2256     LOS: 1 day    Time spent: 35 minutes    Wilfred Dayrit Darleen Crocker, DO [facility]  If 7PM-7AM, please contact night-coverage [email or web address] 11/06/2019, 11:27 AM

## 2019-11-06 NOTE — Progress Notes (Signed)
Pt transported to [facility] via [place]. Alert and verbal with no distress noted at time of departure.

## 2019-11-06 NOTE — Plan of Care (Signed)

## 2019-11-06 NOTE — Progress Notes (Signed)
Hypoglycemic Event  CBG: 62  Treatment:  8 oz Apple Juice given Symptoms: Patient had no symptoms  Follow-up CBG: Time: 1548  CBG Result: 114  Possible Reasons for Event: Patient NPO Comments/MD notified:  Dr. Manuella Ghazi notified.    Zachery Conch

## 2019-11-06 NOTE — Plan of Care (Addendum)
DISCUSSED CASE WITH ANESTHESIA(DR. Charna Elizabeth). PT HAS MULTIPLE NOTES FROM [facility] REGARDING DIFFICULT INTUBATION. PT EXCEEDS OUR ANESTHESIA CAPABILITIES DUE TO PRIOR HISTORY OF BEING A DIFFICULT INTUBATION AND REQUIRING FIBEROPTIC INTUBATION. WILL DISCUSS WITH DR. Marianna TO ARRANGE TRANSFER.

## 2019-11-06 NOTE — Progress Notes (Addendum)
CRITICAL VALUE ALERT  Critical Value:  CBG 62  Date & Time Notied:  11/06/2019  @ 4712  Provider Notified: Dr. Manuella Ghazi  Orders Received/Actions taken:   Hypoglycemic protocol ordered.  Patient given 8 oz apple juice.  Will recheck CBG in 15 minutes.

## 2019-11-06 NOTE — Progress Notes (Signed)
Subjective: No further rectal bleeding since presenting to the hosptal. No BM since Monday morning. Was mildly constipated prior to this starting. Stool were loose on Sunday/Monday morning. No abdominal pain. No heartburn or acid reflux. No nausea or vomiting. States he isn't having much trouble swallowing at all any more. Hoping to have PEG tube removed soon.    Objective: Vital signs in last 24 hours: Temp:  [98 F (36.7 C)-98.3 F (36.8 C)] 98 F (36.7 C) (03/16 0659) Pulse Rate:  [72-90] 89 (03/16 0659) Resp:  [16-18] 16 (03/16 0659) BP: (93-142)/(60-78) 132/68 (03/16 0659) SpO2:  [94 %-100 %] 95 % (03/16 0659) Weight:  [83 kg-83.5 kg] 83 kg (03/15 1856) Last BM Date: 11/04/19 General: Alert and oriented, pleasant Head:  Normocephalic and atraumatic. Eyes: No icterus, sclera clear. Conjuctiva pink.  Lungs: Clear to auscultation bilaterally, without wheezing, rales, or rhonchi.  Abdomen:  Bowel sounds present, soft, non-tender, non-distended. No HSM or hernias noted. No rebound or guarding. No masses appreciated.  Msk:  Symmetrical without gross deformities.  Extremities:  Without edema. Neurologic:  Alert and  oriented x4;  grossly normal neurologically. Skin:  Warm and dry, intact without significant lesions.  Psych:  Normal mood and affect.  Intake/Output from previous day: 03/15 0701 - 03/16 0700 In: 810 [P.O.:360; I.V.:350; NG/GT:100] Out: 1550 [Urine:1550] Intake/Output this shift: No intake/output data recorded.  Lab Results: Recent Labs    11/05/19 1419 11/06/19 0549  WBC 7.2 6.9  HGB 10.0* 9.8*  HCT 32.1* 31.6*  PLT 220 230   BMET Recent Labs    11/05/19 1419  NA 137  K 4.7  CL 103  CO2 28  GLUCOSE 166*  BUN 47*  CREATININE 1.61*  CALCIUM 8.9   LFT Recent Labs    11/05/19 1419  PROT 6.3*  ALBUMIN 3.5  AST 18  ALT 16  ALKPHOS 60  BILITOT 0.6   PT/INR Recent Labs    11/05/19 1419  LABPROT 13.7  INR 1.1     Assessment: 70 year old male with history of extensive head and neck cancer diagnosed in 2018, s/p resection and radiation, now with PEG tube in place for main nutrition but able to tolerate liquids and soft foods by mouth as desired, presenting with reports of 2 episodes of dark burgundy to black stool  on 11/04/19. Hemoglobin on admission 10, down from 13 in December 2020. FOBT positive. He endorses BC powder use and alka seltzer on a routine basis. On Plavix and aspirin and aspirin as well. Remote history of PUD dating back over 20 years ago, with last EGD reportedly in 2005 with gastritis. Last colonoscopy in 2018 for hematochezia with diverticulosis and 2 tubular adenomas. Hemoglobin today 9.8 (stable). No further overt GI bleeding since admission.   With ongoing NSAID use in the setting of Plavix and aspirin, concerns for UGI source but unable to rule out NSAID-induced injury elsewhere in the GI tract. He was on clears last night and made NPO at midnight with plans for possible EGD today pending discussion with anesthesia. Discussed with anesthesia who stated they would go up to see the patient. Spoke with Dr. Oneida Alar. Due to patient being difficult to intubate, he needs transfer for EGD with fiberoptic intubation.   Plan: Transfer to [facility] for EGD with fiberoptic intubation due to history of head and neck cancer with difficult intubation.  Discussed needs for transfer with Dr. Manuella Ghazi. He will contact hospitalitis to transfer care.  I have contacted [facility]  to discuss need for consult/EGD. Spoke with Dr. Paulita Fujita who stated they would see the patient once he was transferred to [facility].  Continue PPI BID.  Continue NPO status for now unless unable to transfer today. Could have clears if unable to transfer.  Continue to monitor for overt GI bleeding.  Continue to monitor H/H.    LOS: 1 day    11/06/2019, 7:48 AM   Aliene Altes, PA-C [facility]

## 2019-11-06 NOTE — Progress Notes (Signed)
Hypoglycemic Event  CBG: 60  Treatment: 12.5 g 50% Dextrose given Symptoms: No symptoms noted  Follow-up CBG: Time:  0807 CBG Result: 138  Possible Reasons for Event: Patient NPO for procedure.  Comments/MD notified: Dr. Manuella Ghazi notified    Colin Wagner

## 2019-11-07 DIAGNOSIS — E11319 Type 2 diabetes mellitus with unspecified diabetic retinopathy without macular edema: Secondary | ICD-10-CM

## 2019-11-07 DIAGNOSIS — I10 Essential (primary) hypertension: Secondary | ICD-10-CM

## 2019-11-07 DIAGNOSIS — D649 Anemia, unspecified: Secondary | ICD-10-CM | POA: Diagnosis not present

## 2019-11-07 DIAGNOSIS — Z794 Long term (current) use of insulin: Secondary | ICD-10-CM

## 2019-11-07 DIAGNOSIS — K625 Hemorrhage of anus and rectum: Secondary | ICD-10-CM | POA: Diagnosis not present

## 2019-11-07 DIAGNOSIS — K921 Melena: Secondary | ICD-10-CM | POA: Diagnosis not present

## 2019-11-07 LAB — GLUCOSE, CAPILLARY
Glucose-Capillary: 139 mg/dL — ABNORMAL HIGH (ref 70–99)
Glucose-Capillary: 119 mg/dL — ABNORMAL HIGH (ref 70–99)

## 2019-11-07 NOTE — Discharge Summary (Signed)
Physician Discharge Summary  Colin Wagner UJW:[id number] DOB: [birth year] DOA: 11/05/2019  PCP: [name], MD  Admit date: 11/05/2019 Discharge date: 11/07/2019  Time spent: 45 minutes  Recommendations for Outpatient Follow-up:  None, patient left against medical advice.   Discharge Diagnoses:  Rectal bleeding Diabetes mellitus, type II Essential hypertension Dyslipidemia History of extensive head and neck cancer  Discharge Condition:   Diet recommendation: None  Filed Weights   11/05/19 1339 11/05/19 1856 11/06/19 2110  Weight: 83.5 kg 83 kg 83 kg    History of present illness:  On 11/05/2019 by Dr. Adella Hare Colin Wagner is a 69 y.o. 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 He does have a long-term  gastrostomy tube which he still uses once a day but is able to eat and drink. He has chronic alcoholic pancreatitis, chronic alcohol abuse and has had a history of gastrointestinal bleeding after using excessive amounts of BC powders in the past. He is followed by Dr. Angela Cox according to the patient he does not follow very closely with him. He does have a history of COPD but no longer smokes cigarettes.  The patient reports that yesterday he had 2 episodes of dark blood in his stools, he has not yet had a stool today but wanted to come get checked out. He denies lightheadedness, shortness of breath, weakness and has no difficulty urinating. He states he still takes the occasional BC powder  He does take Plavix  His symptoms are intermittent, nothing seems to make it better or worse, not associated with any lightheadedness syncope or shortness of breath.  Hospital Course:  Rectal bleeding/Chronic normocytic anemia -Hemoglobin currently 9.8 and does appear to be stable.  No overt bleeding noted since admission -Gastroenterology consulted and appreciated.  Patient was transferred from [facility] to [facility] as he was thought to be a difficult intubation given his history of head and neck cancer -Discussed with gastroenterology, recommended PEG tube lavage.  Discussed with nursing staff, no blood noted. -He was placed on PPI twice daily -Patient opted to leave [facility] today  Diabetes mellitus, type II -A1c of 7 -Metformin glipizide were held, patient was placed on sliding scale and Levemir p  Essential hypertension -HCTZ held during hospitalization  Dyslipidemia -Continue statin  History of extensive head and neck cancer -Has post resection and radiation -With PEG tube in place, main source of nutrition.  Is able to tolerate some liquids and soft foods by mouth  Procedures: None  Consultations: Gastroenterology  Discharge Exam: Vitals:   11/07/19 0405 11/07/19 0952  BP: (!) 118/59 140/78  Pulse: 89 93  Resp: 18 18  Temp: 97.7 F (36.5 C) 98.8 F (37.1 C)  SpO2: 95% 98%     General: Well developed, well nourished, NAD, appears stated age  HEENT: NCAT, mucous membranes moist.  Cardiovascular: S1 S2 auscultated, RRR  Respiratory: Clear to auscultation bilaterally   Abdomen: Soft, nontender, nondistended, + bowel sounds, peg  Extremities: warm dry without cyanosis clubbing or edema  Neuro: AAOx3, nonfocal  Psych: Appropriate mood and affect  Discharge Instructions  Allergies as of 11/07/2019   No Known Allergies         No Known Allergies    The results of significant diagnostics from this hospitalization (including imaging, microbiology, ancillary and laboratory) are listed below for reference.    Significant Diagnostic Studies: No results found.  Microbiology: Recent  Results (from the past 240 hour(s))  SARS CORONAVIRUS 2 (TAT 6-24 HRS) Nasopharyngeal Nasopharyngeal Swab     Status: None   Collection Time: 11/05/19  3:40 PM   Specimen: Nasopharyngeal Swab  Result Value Ref Range Status   SARS  Coronavirus 2 NEGATIVE NEGATIVE Final    Comment: (NOTE) SARS-CoV-2 target nucleic acids are NOT DETECTED. The SARS-CoV-2 RNA is generally detectable in upper and lower respiratory specimens during the acute phase of infection. Negative results do not preclude SARS-CoV-2 infection, do not rule out co-infections with other pathogens, and should not be used as the sole basis for treatment or other patient management decisions. Negative results must be combined with clinical observations, patient history, and epidemiological information. The expected result is Negative. Fact Sheet for Patients: SugarRoll.be Fact Sheet for Healthcare Providers: https://www.woods-mathews.com/ This test is not yet approved or cleared by the [place] FDA and  has been authorized for detection and/or diagnosis of SARS-CoV-2 by FDA under an Emergency Use Authorization (EUA). This EUA will remain  in effect (meaning this test can be used) for the duration of the COVID-19 declaration under Section 56 4(b)(1) of the Act, 21 U.S.C. section 360bbb-3(b)(1), unless the authorization is terminated or revoked sooner. Performed at [facility], [place] [address], [place], [place] [address]      Labs: Basic Metabolic Panel: Recent Labs  Lab 11/05/19 1419  NA 137  K 4.7  CL 103  CO2 28  GLUCOSE 166*  BUN 47*  CREATININE 1.61*  CALCIUM 8.9   Liver Function Tests: Recent Labs  Lab 11/05/19 1419  AST 18  ALT 16  ALKPHOS 60  BILITOT 0.6  PROT 6.3*  ALBUMIN 3.5   No results for input(s): LIPASE, AMYLASE in the last 168 hours. No results for input(s): AMMONIA in the last 168 hours. CBC: Recent Labs  Lab 11/05/19 1419 11/06/19 0549  WBC 7.2 6.9  NEUTROABS 5.8  --   HGB 10.0* 9.8*  HCT 32.1* 31.6*  MCV 84.0 82.3  PLT 220 230   Cardiac Enzymes: No results for input(s): CKTOTAL, CKMB, CKMBINDEX, TROPONINI in the last 168 hours. BNP: BNP (last 3  results) No results for input(s): BNP in the last 8760 hours.  ProBNP (last 3 results) No results for input(s): PROBNP in the last 8760 hours.  CBG: Recent Labs  Lab 11/06/19 1554 11/06/19 1834 11/06/19 2109 11/07/19 0403 11/07/19 0737  GLUCAP 114* 225* 154* 139* 119*       Signed:  Ilma Achee  [facility] 11/07/2019, 1:46 PM

## 2019-11-07 NOTE — Plan of Care (Signed)
  Problem: Health Behavior/Discharge Planning: Goal: Ability to manage health-related needs will improve Outcome: Progressing   

## 2019-11-07 NOTE — Progress Notes (Signed)
New Admission Note: ? Arrival Method: Stretcher Mental Orientation: Alert and Oriented X4 Telemetry: No Assessment: Completed Skin: Refer to flowsheet IV: Left antecubital Pain: 0/10 Tubes: PEG tube in left upper quadrant Safety Measures: Safety Fall Prevention Plan discussed with patient. Admission: Completed 5 [place] Orientation: Patient has been orientated to the room, unit and the staff. Family: None at the bedside at this time Orders have been reviewed and are being implemented. Will continue to monitor the patient. Call light has been placed within reach and bed alarm has been activated.  ? Milagros Loll, RN  Phone Number: [phone]

## 2019-11-07 NOTE — Plan of Care (Signed)
  Problem: Education: Goal: Knowledge of General Education information will improve Description Including pain rating scale, medication(s)/side effects and non-pharmacologic comfort measures Outcome: Progressing   

## 2019-11-07 NOTE — Progress Notes (Signed)
Patient left AMA. Patient signed AMA form. IV was taken out, and left with no heart monitor. MD was informed. Patient left with belongings in hand.   Farley Ly RN

## 2019-11-27 ENCOUNTER — Encounter: Payer: Self-pay | Admitting: Internal Medicine

## 2019-11-30 ENCOUNTER — Ambulatory Visit: Payer: Self-pay | Admitting: Radiation Oncology

## 2019-12-04 ENCOUNTER — Other Ambulatory Visit: Payer: Self-pay

## 2019-12-04 ENCOUNTER — Encounter: Payer: Self-pay | Admitting: Gastroenterology

## 2019-12-04 ENCOUNTER — Ambulatory Visit (INDEPENDENT_AMBULATORY_CARE_PROVIDER_SITE_OTHER): Payer: Medicare Other | Admitting: Gastroenterology

## 2019-12-04 DIAGNOSIS — K922 Gastrointestinal hemorrhage, unspecified: Secondary | ICD-10-CM | POA: Diagnosis not present

## 2019-12-04 LAB — CBC WITH DIFFERENTIAL/PLATELET
Absolute Monocytes: 433 cells/uL (ref 200–950)
Basophils Absolute: 29 cells/uL (ref 0–200)
Basophils Relative: 0.5 %
Eosinophils Absolute: 234 cells/uL (ref 15–500)
Eosinophils Relative: 4.1 %
HCT: 38.8 % (ref 38.5–50.0)
Hemoglobin: 11.7 g/dL — ABNORMAL LOW (ref 13.2–17.1)
Lymphs Abs: 758 cells/uL — ABNORMAL LOW (ref 850–3900)
MCH: 25.2 pg — ABNORMAL LOW (ref 27.0–33.0)
MCHC: 30.2 g/dL — ABNORMAL LOW (ref 32.0–36.0)
MCV: 83.4 fL (ref 80.0–100.0)
MPV: 12.5 fL (ref 7.5–12.5)
Monocytes Relative: 7.6 %
Neutro Abs: 4247 cells/uL (ref 1500–7800)
Neutrophils Relative %: 74.5 %
Platelets: 301 10*3/uL (ref 140–400)
RBC: 4.65 10*6/uL (ref 4.20–5.80)
RDW: 16.1 % — ABNORMAL HIGH (ref 11.0–15.0)
Total Lymphocyte: 13.3 %
WBC: 5.7 10*3/uL (ref 3.8–10.8)

## 2019-12-04 LAB — IRON,TIBC AND FERRITIN PANEL
%SAT: 8 % (calc) — ABNORMAL LOW (ref 20–48)
Ferritin: 8 ng/mL — ABNORMAL LOW (ref 24–380)
Iron: 31 ug/dL — ABNORMAL LOW (ref 50–180)
TIBC: 398 mcg/dL (calc) (ref 250–425)

## 2019-12-04 NOTE — Patient Instructions (Signed)
Please go for labs this week. We will contact you with results and next step in care.  Continue pantoprazole 40mg  daily.  Avoid BC and Goody powders.

## 2019-12-04 NOTE — Progress Notes (Signed)
Primary Care Physician: [name], MD  Primary Gastroenterologist:  Garfield Cornea, MD   Chief Complaint  Patient presents with  . GI Bleeding    was in hospital 10/2019. No current bleeding per pt    HPI: Colin Wagner is a 69 y.o. 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  Completed radiation in October 2018.  G-tube in place but he is able to eat and drink orally.  Revision surgery of the lip has allowed him to increase his ability for oral intake.  His G-tube remains, he uses predominantly for supplement, 2 Glucerna every night.  Patient has remote history of peptic ulcer disease in 1999, surveillance EGD in 2001 showing gastric ulcer healed.  Last EGD in 2005 gastritis.  Presented to the hospital on March 15 with 2 episodes of burgundy to black stool.  He reported BC powder use 1-2 times per week.  Reported being on pantoprazole as an outpatient.  Also on Plavix and aspirin.  Due to his history of difficult intubation, patient was transferred to [facility] for EGD with fiberoptic intubation, [facility] on call and agreed to see patient once he was transferred.  Once patient was transferred to [facility], GI recommended PEG tube lavage, nursing staff reported no blood.  Patient reportedly signed out AMA the following day.  It was never seen by GI at [facility].  When he presented on March 15 his hemoglobin was 10, back in December 2020, hemoglobin 13.5.  When he was discharged on March 16 his hemoglobin was 9.8.  Patient states he is seen no further bleeding.  Denies abdominal pain.  Occasional mild heartburn.  Denies any further aspirin powder use.  Uses stool softeners as needed for constipation with adequate control.  He has some very vague intermittent left upper quadrant discomfort which lasts for couple hours at a time and goes away on its own.  Unrelated to meals.   Unrelated to BMs.  Last colonoscopy in March 2018 with 2 tubular adenomas removed, 5 to 7 mm in size.  Next colonoscopy planned for March 2023.  Current Outpatient Medications  Medication Sig Dispense Refill  . aspirin EC 81 MG tablet Take 81 mg by mouth daily.    Marland Kitchen atorvastatin (LIPITOR) 40 MG tablet Take 40 mg by mouth daily at 6 PM.     . cetirizine (ZYRTEC) 10 MG tablet Take 10 mg by mouth daily.    . clopidogrel (PLAVIX) 75 MG tablet Take 75 mg by mouth daily.    . cyclobenzaprine (FLEXERIL) 10 MG tablet Take 10 mg by mouth 4 (four) times daily as needed.     . fluticasone (FLONASE) 50 MCG/ACT nasal spray Place 2 sprays into both nostrils daily.     Marland Kitchen glipiZIDE (GLUCOTROL XL) 2.5 MG 24 hr tablet Take 2.5 mg by mouth every morning.    . hydrochlorothiazide (HYDRODIURIL) 12.5 MG tablet Take 12.5 mg by mouth daily.    Marland Kitchen HYDROcodone-acetaminophen (NORCO) 7.5-325 MG tablet Take 1 tablet by mouth every 6 (six) hours as needed (pain). (Patient taking differently: Take 1 tablet by mouth every 6 (six) hours as needed for moderate pain. ) 60 tablet 0  . LEVEMIR FLEXTOUCH 100 UNIT/ML Pen Inject 40 Units into the skin at bedtime. X 6 more nights, then return back to 22 units at bedtime 15 mL 11  . lidocaine (XYLOCAINE) 2 % solution  Patient: Mix 1part 2% viscous lidocaine, 1part H20. Swish &/or swallow 26mL of diluted mixture, 88min before meals and at bedtime, up to QID (Patient taking differently: Use as directed 5 mLs in the mouth or throat 3 (three) times daily as needed for mouth pain. Patient: Mix 1part 2% viscous lidocaine, 1part H20. Swish &/or swallow.) 100 mL 5  . LORazepam (ATIVAN) 1 MG tablet Take 1 mg by mouth 4 (four) times daily as needed for anxiety.   5  . Nutritional Supplements (GLUCERNA 1.5 CAL PO) 480 mLs by PEG Tube route 3 (three) times daily. 120 ml water before 229ml water after    . pantoprazole (PROTONIX) 40 MG tablet Take 40 mg by mouth daily.     Marland Kitchen senna (SENOKOT) 8.6 MG TABS  tablet Take 1 tablet (8.6 mg total) by mouth at bedtime as needed for mild constipation. May be needed as pain medication can constipate. 120 each 1  . sildenafil (REVATIO) 20 MG tablet Take 20 mg by mouth as needed.    . tamsulosin (FLOMAX) 0.4 MG CAPS capsule Take 0.4 mg by mouth in the morning and at bedtime.    . Testosterone Cypionate 200 MG/ML SOLN Inject as directed every 14 (fourteen) days.    . traZODone (DESYREL) 50 MG tablet Take 100 mg by mouth at bedtime.     . Water For Irrigation, Sterile (FREE WATER) SOLN Place 100 mLs into feeding tube every 8 (eight) hours. 1000 mL 0  . zinc gluconate 50 MG tablet Take 50 mg by mouth daily.     No current facility-administered medications for this visit.    Allergies as of 12/04/2019  . (No Known Allergies)    ROS:  General: Negative for anorexia, weight loss, fever, chills, fatigue, weakness. ENT: Negative for hoarseness,  nasal congestion.  See HPI CV: Negative for chest pain, angina, palpitations, dyspnea on exertion, peripheral edema.  Respiratory: Negative for dyspnea at rest, dyspnea on exertion, cough, sputum, wheezing.  GI: See history of present illness. GU:  Negative for dysuria, hematuria, urinary incontinence, urinary frequency, nocturnal urination.  Endo: Negative for unusual weight change.    Physical Examination:   BP 128/69   Pulse 87   Temp (!) 97.1 F (36.2 C) (Oral)   Ht 5\' 11"  (1.803 m)   Wt 195 lb (88.5 kg)   BMI 27.20 kg/m   General: Well-nourished, well-developed in no acute distress.  Eyes: No icterus. Mouth: masked Lungs: Clear to auscultation bilaterally.  Heart: Regular rate and rhythm, no murmurs rubs or gallops.  Abdomen: Bowel sounds are normal, nontender, nondistended, no hepatosplenomegaly or masses, no abdominal bruits or hernia , no rebound or guarding.   Extremities: No lower extremity edema. No clubbing or deformities. Neuro: Alert and oriented x 4   Skin: Warm and dry, no jaundice.     Psych: Alert and cooperative, normal mood and affect.  Labs:  Lab Results  Component Value Date   CREATININE 1.61 (H) 11/05/2019   BUN 47 (H) 11/05/2019   NA 137 11/05/2019   K 4.7 11/05/2019   CL 103 11/05/2019   CO2 28 11/05/2019   Lab Results  Component Value Date   WBC 6.9 11/06/2019   HGB 9.8 (L) 11/06/2019   HCT 31.6 (L) 11/06/2019   MCV 82.3 11/06/2019   PLT 230 11/06/2019   Lab Results  Component Value Date   ALT 16 11/05/2019   AST 18 11/05/2019   ALKPHOS 60 11/05/2019   BILITOT 0.6 11/05/2019  Lab Results  Component Value Date   FERRITIN 120 07/30/2019   No results found for: VITAMINB12 No results found for: FOLATE  Imaging Studies: No results found.  Impression/plan:  Pleasant 69 year old gentleman with history of extensive head and neck cancer status post resection and radiation, still utilizing PEG for nutritional supplement, presenting for follow-up of recent hospitalization for GI bleeding.  Patient had 2 episodes of dark bloody/black stools last month which prompted hospitalization.  Appear to have a drop from baseline hemoglobin although hemoglobin stayed stable in the 10 range and he did not require blood transfusion.  He was deemed to need transfer to a facility that could offer fiberoptic intubation at time of EGD given his history of difficult intubation.  His transfer to [facility] for further management but prior to patient seeing GI he left AMA as he felt nothing was being done.  He denies any further GI bleeding.  Last EGD was in 2005, last colonoscopy in 2018.  He continues to avoid aspirin powders, none in several weeks.  Continues Plavix and aspirin daily.  Denies other NSAIDs.  We will update labs, to discuss with Dr. [name] once received.  If patient needs endoscopic evaluation, will likely need to be done at facility offering higher level of anesthesia support.  Further recommendations to follow.

## 2019-12-05 NOTE — Progress Notes (Addendum)
Colin Wagner presents for follow up of radiation completed 06/22/2017 to his left cheek and bilateral neck  Pain issues, if any: None to note currently Using a feeding tube?: Yes, 1-2 times a day but it still able to eat/drink by mouth. Weight changes, if any:  Wt Readings from Last 3 Encounters:  12/07/19 197 lb 4 oz (89.5 kg)  12/04/19 195 lb (88.5 kg)  11/06/19 182 lb 15.7 oz (83 kg)    Swallowing issues, if any: None, as long as he takes small bites and goes slowly he doesn't have any trouble. Smoking or chewing tobacco? None Using fluoride trays daily? N/A Last ENT visit was on: 08/07/2019 Dr. Heath Lark: s/p left sided lower lip revision. 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 We will see how this impacts his ability to swallow during his next speech therapy appointment.  --Follow up in 3 months --Speech therapy f/u Other notable issues, if any: Went to ED on 11/05/2019 after 2 episodes of dark blood in his stool. He was admitted to hospital but left AMA two days later. Had an appointment yesterday with GI specialist. He states lab work was drawn to decide what the next steps will be. He occasionally has a tightness along the left side of his neck under his surgical incision. Patient states it occurs randomly and he has to move his head side to side for it to resolve.  Vitals:   12/07/19 1122  BP: 135/72  Pulse: 82  Resp: 20  Temp: 99.1 F (37.3 C)  TempSrc: Temporal  SpO2: 96%  Weight: 197 lb 4 oz (89.5 kg)  Height: 5\' 11"  (1.803 m)

## 2019-12-07 ENCOUNTER — Ambulatory Visit
Admission: RE | Admit: 2019-12-07 | Discharge: 2019-12-07 | Disposition: A | Discharge status: Home / Self Care | Payer: Medicare Other | Source: Ambulatory Visit | Attending: Radiation Oncology | Admitting: Radiation Oncology

## 2019-12-07 ENCOUNTER — Other Ambulatory Visit: Payer: Self-pay

## 2019-12-07 VITALS — BP 135/72 | HR 82 | Temp 99.1°F | Resp 20 | Ht 71.0 in | Wt 197.2 lb

## 2019-12-07 DIAGNOSIS — C411 Malignant neoplasm of mandible: Secondary | ICD-10-CM

## 2019-12-07 DIAGNOSIS — R635 Abnormal weight gain: Secondary | ICD-10-CM

## 2019-12-07 DIAGNOSIS — Z85819 Personal history of malignant neoplasm of unspecified site of lip, oral cavity, and pharynx: Secondary | ICD-10-CM | POA: Diagnosis present

## 2019-12-07 DIAGNOSIS — Z8589 Personal history of malignant neoplasm of other organs and systems: Secondary | ICD-10-CM | POA: Diagnosis present

## 2019-12-07 DIAGNOSIS — Z923 Personal history of irradiation: Secondary | ICD-10-CM | POA: Diagnosis not present

## 2019-12-07 DIAGNOSIS — Z794 Long term (current) use of insulin: Secondary | ICD-10-CM | POA: Diagnosis not present

## 2019-12-07 DIAGNOSIS — Z7982 Long term (current) use of aspirin: Secondary | ICD-10-CM | POA: Diagnosis not present

## 2019-12-07 DIAGNOSIS — C060 Malignant neoplasm of cheek mucosa: Secondary | ICD-10-CM

## 2019-12-07 DIAGNOSIS — Z79899 Other long term (current) drug therapy: Secondary | ICD-10-CM | POA: Diagnosis not present

## 2019-12-07 LAB — TSH: TSH: 2.386 u[IU]/mL (ref 0.320–4.118)

## 2019-12-09 ENCOUNTER — Encounter: Payer: Self-pay | Admitting: Gastroenterology

## 2019-12-10 ENCOUNTER — Encounter: Payer: Self-pay | Admitting: Radiation Oncology

## 2019-12-10 NOTE — Progress Notes (Signed)
Radiation Oncology         [phone]) [phone] ________________________________  Name: Colin Wagner MRN: [id number]  Date: 12/07/2019  DOB: [birth year]  Follow-Up Visit Note face-to-face  CC: [name], MD  [name], MD  Diagnosis and Prior Radiotherapy:     C41.1    ICD-10-CM   1. Squamous cell carcinoma of mandible (HCC)  C41.1 TSH    IR GASTROSTOMY TUBE REMOVAL    CANCELED: IR GASTROSTOMY TUBE MOD SED  2. Weight gain  R63.5 TSH  3. 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    He is here with his significant other.  He is eating much better.  He is not reliant on his feeding tube anymore.  He would like to have it removed, but not for 2 months.  His eating has improved thanks to left-sided lower lip revision by Dr. Vertell Limber.  He is continuing speech therapy.  He is following with gastroenterology due to 2 episodes of dark blood in his stool  He still has some tightness in his neck.    ALLERGIES:  has No Known Allergies.  Meds: Current Outpatient Medications  Medication Sig Dispense Refill  . aspirin EC 81 MG tablet Take 81 mg by mouth daily.    Marland Kitchen atorvastatin (LIPITOR) 40 MG tablet Take 40 mg by mouth daily at 6 PM.     . cetirizine (ZYRTEC) 10 MG tablet Take 10 mg by mouth daily.    . clopidogrel (PLAVIX) 75 MG tablet Take 75 mg by mouth daily.    . cyclobenzaprine (FLEXERIL) 10 MG tablet Take 10 mg by mouth 4 (four) times daily as needed.     . fluticasone (FLONASE) 50 MCG/ACT nasal spray Place 2 sprays into both nostrils daily.     Marland Kitchen glipiZIDE (GLUCOTROL  XL) 2.5 MG 24 hr tablet Take 2.5 mg by mouth every morning.    Marland Kitchen HYDROcodone-acetaminophen (NORCO) 7.5-325 MG tablet Take 1 tablet by mouth every 6 (six) hours as needed (pain). (Patient taking differently: Take 1 tablet by mouth every 6 (six) hours as needed for moderate pain. ) 60 tablet 0  . LEVEMIR FLEXTOUCH 100 UNIT/ML Pen Inject 40 Units into the skin at bedtime. X 6 more nights, then return back to 22 units at bedtime (Patient taking differently: Inject 26 Units into the skin at bedtime. X 6 more nights, then return back to 22 units at bedtime) 15 mL 11  . lidocaine (XYLOCAINE) 2 % solution Patient: Mix 1part 2% viscous lidocaine, 1part H20. Swish &/or swallow 38mL of diluted mixture, 48min before meals and at bedtime, up to QID (Patient taking differently: Use as directed 5 mLs in the mouth or throat 3 (three) times daily as needed for mouth pain. Patient: Mix 1part 2% viscous lidocaine, 1part H20. Swish &/or swallow.) 100 mL 5  . LORazepam (ATIVAN) 1 MG tablet Take 1 mg by mouth 4 (four) times daily as needed for anxiety.   5  . Nutritional Supplements (GLUCERNA 1.5 CAL PO) 480 mLs by PEG Tube route 3 (three)  times daily. 120 ml water before 242ml water after    . pantoprazole (PROTONIX) 40 MG tablet Take 40 mg by mouth daily.     Marland Kitchen senna (SENOKOT) 8.6 MG TABS tablet Take 1 tablet (8.6 mg total) by mouth at bedtime as needed for mild constipation. May be needed as pain medication can constipate. 120 each 1  . sildenafil (REVATIO) 20 MG tablet Take 20 mg by mouth as needed.    . Testosterone Cypionate 200 MG/ML SOLN Inject as directed every 14 (fourteen) days.    . traZODone (DESYREL) 50 MG tablet Take 100 mg by mouth at bedtime.     . Water For Irrigation, Sterile (FREE WATER) SOLN Place 100 mLs into feeding tube every 8 (eight) hours. 1000 mL 0  . zinc gluconate 50 MG tablet Take 50 mg by mouth daily.    . hydrochlorothiazide (HYDRODIURIL) 12.5 MG tablet Take 12.5 mg by mouth daily.    .  tamsulosin (FLOMAX) 0.4 MG CAPS capsule Take 0.4 mg by mouth in the morning and at bedtime.     No current facility-administered medications for this encounter.    Physical Findings: Wt Readings from Last 3 Encounters:  12/07/19 197 lb 4 oz (89.5 kg)  12/04/19 195 lb (88.5 kg)  11/06/19 182 lb 15.7 oz (83 kg)    height is 5\' 11"  (1.803 m) and weight is 197 lb 4 oz (89.5 kg). His temporal temperature is 99.1 F (37.3 C). His blood pressure is 135/72 and his pulse is 82. His respiration is 20 and oxygen saturation is 96%.   General: Alert and oriented, in no acute distress.  His speech is still hard to understand HEENT: No oral or upper throat lesions. Lymphedema in the face has improved.  He has fibrosis concentrated in the left neck consistent with prior surgery and radiation. No palpable masses noted.  skin: Over face and neck his skin is intact  psych: appropriate affect, judgment appears intact.     Lab Findings: Lab Results  Component Value Date   WBC 5.7 12/04/2019   HGB 11.7 (L) 12/04/2019   HCT 38.8 12/04/2019   MCV 83.4 12/04/2019   PLT 301 12/04/2019    Lab Results  Component Value Date   TSH 2.386 12/07/2019    Radiographic Findings: No results found.  Impression/Plan:    1) Head and Neck Cancer Status: NED   2) Nutritional Status: stable, no longer using PEG tube. ordering for PEG removal in 2 months per patient preference.  3) Risk Factors: The patient has been educated about risk factors including alcohol and tobacco abuse; they understand that avoidance of alcohol and tobacco is important to prevent recurrences as well as other cancers. The patient is not using tobacco.    4) Swallowing: Continue speech[facility]language pathology at [facility]  5) Dental: Encouraged to continue regular followup with dentistry, and dental hygiene including fluoride and flossing.   6) Thyroid function: Within normal limits , continue checking annually Lab Results  Component  Value Date   TSH 2.386 12/07/2019    7) Other:  Continue to follow with otolaryngology.    I will see him back in 1 year   _____________________________________   [name], MD  This document serves as a record of services personally performed by [name], MD. It was created on her behalf by Wilburn Mylar, a trained medical scribe. The creation of this record is based on the scribe's personal observations and the provider's statements to them.  This document has been checked and approved by the attending provider.

## 2019-12-25 ENCOUNTER — Encounter: Payer: Self-pay | Admitting: Emergency Medicine

## 2019-12-25 ENCOUNTER — Other Ambulatory Visit: Payer: Self-pay

## 2019-12-25 DIAGNOSIS — D509 Iron deficiency anemia, unspecified: Secondary | ICD-10-CM

## 2020-01-29 LAB — CBC WITH DIFFERENTIAL/PLATELET
Absolute Monocytes: 555 cells/uL (ref 200–950)
Basophils Absolute: 30 cells/uL (ref 0–200)
Basophils Relative: 0.4 %
Eosinophils Absolute: 144 cells/uL (ref 15–500)
Eosinophils Relative: 1.9 %
HCT: 50.1 % — ABNORMAL HIGH (ref 38.5–50.0)
Hemoglobin: 15.8 g/dL (ref 13.2–17.1)
Lymphs Abs: 920 cells/uL (ref 850–3900)
MCH: 26.2 pg — ABNORMAL LOW (ref 27.0–33.0)
MCHC: 31.5 g/dL — ABNORMAL LOW (ref 32.0–36.0)
MCV: 83.1 fL (ref 80.0–100.0)
Monocytes Relative: 7.3 %
Neutro Abs: 5951 cells/uL (ref 1500–7800)
Neutrophils Relative %: 78.3 %
Platelets: 228 10*3/uL (ref 140–400)
RBC: 6.03 10*6/uL — ABNORMAL HIGH (ref 4.20–5.80)
RDW: 19.8 % — ABNORMAL HIGH (ref 11.0–15.0)
Total Lymphocyte: 12.1 %
WBC: 7.6 10*3/uL (ref 3.8–10.8)

## 2020-01-29 LAB — FERRITIN: Ferritin: 17 ng/mL — ABNORMAL LOW (ref 24–380)

## 2020-02-08 ENCOUNTER — Ambulatory Visit (HOSPITAL_COMMUNITY)
Admission: RE | Admit: 2020-02-08 | Discharge: 2020-02-08 | Disposition: A | Discharge status: Home / Self Care | Payer: Medicare Other | Source: Ambulatory Visit | Attending: Radiation Oncology | Admitting: Radiation Oncology

## 2020-02-08 ENCOUNTER — Other Ambulatory Visit: Payer: Self-pay

## 2020-02-08 DIAGNOSIS — C411 Malignant neoplasm of mandible: Secondary | ICD-10-CM | POA: Insufficient documentation

## 2020-02-08 DIAGNOSIS — Z431 Encounter for attention to gastrostomy: Secondary | ICD-10-CM | POA: Insufficient documentation

## 2020-02-08 HISTORY — PX: IR GASTROSTOMY TUBE REMOVAL: IMG5492

## 2020-02-08 NOTE — Procedures (Signed)
Successful bedside removal of intact balloon retention G-tube. No immediate post procedural complications.  Ronny Bacon, MD Pager #: [phone]

## 2020-02-26 ENCOUNTER — Other Ambulatory Visit: Payer: Self-pay | Admitting: Emergency Medicine

## 2020-02-26 DIAGNOSIS — D509 Iron deficiency anemia, unspecified: Secondary | ICD-10-CM

## 2020-02-26 NOTE — Progress Notes (Signed)
cbc

## 2020-03-05 ENCOUNTER — Ambulatory Visit (HOSPITAL_COMMUNITY)
Admission: RE | Admit: 2020-03-05 | Discharge: 2020-03-05 | Disposition: A | Discharge status: Home / Self Care | Payer: Medicare Other | Source: Ambulatory Visit | Attending: Family Medicine | Admitting: Family Medicine

## 2020-03-05 ENCOUNTER — Other Ambulatory Visit (HOSPITAL_COMMUNITY): Payer: Self-pay | Admitting: Family Medicine

## 2020-03-05 ENCOUNTER — Other Ambulatory Visit: Payer: Self-pay

## 2020-03-05 DIAGNOSIS — R079 Chest pain, unspecified: Secondary | ICD-10-CM

## 2020-03-13 ENCOUNTER — Other Ambulatory Visit: Payer: Self-pay

## 2020-03-13 DIAGNOSIS — D509 Iron deficiency anemia, unspecified: Secondary | ICD-10-CM

## 2020-04-12 LAB — IRON,TIBC AND FERRITIN PANEL
%SAT: 19 % (calc) — ABNORMAL LOW (ref 20–48)
Ferritin: 33 ng/mL (ref 24–380)
Iron: 61 ug/dL (ref 50–180)
TIBC: 329 mcg/dL (calc) (ref 250–425)

## 2020-04-17 ENCOUNTER — Other Ambulatory Visit: Payer: Self-pay

## 2020-04-17 DIAGNOSIS — D509 Iron deficiency anemia, unspecified: Secondary | ICD-10-CM

## 2020-04-21 LAB — CBC WITH DIFFERENTIAL/PLATELET
Absolute Monocytes: 570 cells/uL (ref 200–950)
Basophils Absolute: 30 cells/uL (ref 0–200)
Basophils Relative: 0.5 %
Eosinophils Absolute: 138 cells/uL (ref 15–500)
Eosinophils Relative: 2.3 %
HCT: 49.2 % (ref 38.5–50.0)
Hemoglobin: 15.8 g/dL (ref 13.2–17.1)
Lymphs Abs: 912 cells/uL (ref 850–3900)
MCH: 27.6 pg (ref 27.0–33.0)
MCHC: 32.1 g/dL (ref 32.0–36.0)
MCV: 85.9 fL (ref 80.0–100.0)
MPV: 11.2 fL (ref 7.5–12.5)
Monocytes Relative: 9.5 %
Neutro Abs: 4350 cells/uL (ref 1500–7800)
Neutrophils Relative %: 72.5 %
Platelets: 192 10*3/uL (ref 140–400)
RBC: 5.73 10*6/uL (ref 4.20–5.80)
RDW: 16.7 % — ABNORMAL HIGH (ref 11.0–15.0)
Total Lymphocyte: 15.2 %
WBC: 6 10*3/uL (ref 3.8–10.8)

## 2020-04-29 ENCOUNTER — Other Ambulatory Visit: Payer: Self-pay

## 2020-04-29 ENCOUNTER — Encounter: Payer: Self-pay | Admitting: Gastroenterology

## 2020-04-29 ENCOUNTER — Ambulatory Visit (INDEPENDENT_AMBULATORY_CARE_PROVIDER_SITE_OTHER): Payer: Medicare Other | Admitting: Gastroenterology

## 2020-04-29 DIAGNOSIS — K219 Gastro-esophageal reflux disease without esophagitis: Secondary | ICD-10-CM

## 2020-04-29 DIAGNOSIS — D509 Iron deficiency anemia, unspecified: Secondary | ICD-10-CM

## 2020-04-29 NOTE — Progress Notes (Signed)
Primary Care Physician: [name], MD  Primary Gastroenterologist:  Garfield Cornea, MD   Chief Complaint  Patient presents with  . Follow-up    HPI: Colin Wagner is a 69 y.o. 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  Completed radiation in October 2018.  Revision surgery of the lip has allowed him to increase his ability for oral intake.  He had a G-tube removal February 08, 2020.  He has been able to maintain his weight.  Patient has remote history of peptic ulcer disease in 1999, surveillance EGD in 2001 showed gastric ulcer healed.  Last EGD in 2005 showed gastritis.  Presented to the hospital on March with 2 episodes of burgundy to black stool.  Reported BC powder use 1-2 times per week.  Also on Plavix and aspirin.  Due to history of difficult intubation, patient was transferred to [facility] for EGD with fiberoptic intubation, [facility] on call and agreed to see patient once he was transferred. Once patient was transferred to [facility], GI recommended PEG tube lavage, nursing staff reported no blood.  Patient reportedly signed out AMA the following day.  He was never seen by GI at [facility].  At time of discharge on March 16 his hemoglobin was 9.8.  Last colonoscopy March 2018 with 2 tubular adenomas removed, 5 to 7 mm in size.  Next colonoscopy planned for March 2023.  Patient discontinued aspirin powders back in March.  We initially plan to refer him to [facility] for EGD but his hemoglobin normalized and it was mutually decided to hold off on EGD for now.  Recent labs with hemoglobin of 15.8, iron of 61, ferritin 33 up from 17, TIBC 329, iron saturation is 19% slightly low.  Recently ran out of iron.  Plans to get more today.  Denies any melena or rectal bleeding.  His appetite is much better since he has had his feeding tube removed.  Denies any difficulty swallowing.   Heartburn well controlled.  Bowel movements regular.    Current Outpatient Medications  Medication Sig Dispense Refill  . aspirin EC 81 MG tablet Take 81 mg by mouth daily.    Marland Kitchen atorvastatin (LIPITOR) 40 MG tablet Take 40 mg by mouth daily at 6 PM.     . cetirizine (ZYRTEC) 10 MG tablet Take 10 mg by mouth daily.    . clopidogrel (PLAVIX) 75 MG tablet Take 75 mg by mouth daily.    . cyclobenzaprine (FLEXERIL) 10 MG tablet Take 10 mg by mouth 4 (four) times daily as needed.     . fluticasone (FLONASE) 50 MCG/ACT nasal spray Place 2 sprays into both nostrils daily.     Marland Kitchen glipiZIDE (GLUCOTROL XL) 2.5 MG 24 hr tablet Take 2.5 mg by mouth every morning.    . hydrochlorothiazide (HYDRODIURIL) 12.5 MG tablet Take 12.5 mg by mouth daily.    Marland Kitchen HYDROcodone-acetaminophen (NORCO) 7.5-325 MG tablet Take 1 tablet by mouth every 6 (six) hours as needed (pain). (Patient taking differently: Take 1 tablet by mouth every 6 (six) hours as needed for moderate pain. ) 60 tablet 0  . LEVEMIR FLEXTOUCH 100 UNIT/ML Pen Inject 40 Units into the skin at bedtime. X 6 more nights, then return back to 22 units at bedtime (Patient taking differently: Inject 26 Units into the skin at bedtime. X 6 more nights, then return back to 22 units  at bedtime) 15 mL 11  . lidocaine (XYLOCAINE) 2 % solution Patient: Mix 1part 2% viscous lidocaine, 1part H20. Swish &/or swallow 56mL of diluted mixture, 73min before meals and at bedtime, up to QID (Patient taking differently: Use as directed 5 mLs in the mouth or throat 3 (three) times daily as needed for mouth pain. Patient: Mix 1part 2% viscous lidocaine, 1part H20. Swish &/or swallow.) 100 mL 5  . LORazepam (ATIVAN) 1 MG tablet Take 1 mg by mouth 4 (four) times daily as needed for anxiety.   5  . pantoprazole (PROTONIX) 40 MG tablet Take 40 mg by mouth daily.     Marland Kitchen senna (SENOKOT) 8.6 MG TABS tablet Take 1 tablet (8.6 mg total) by mouth at bedtime as needed for mild constipation. May be  needed as pain medication can constipate. 120 each 1  . sildenafil (REVATIO) 20 MG tablet Take 20 mg by mouth as needed.    . Testosterone Cypionate 200 MG/ML SOLN Inject as directed every 14 (fourteen) days.    . traZODone (DESYREL) 50 MG tablet Take 100 mg by mouth at bedtime.     Marland Kitchen zinc gluconate 50 MG tablet Take 50 mg by mouth daily.     No current facility-administered medications for this visit.    Allergies as of 04/29/2020  . (No Known Allergies)    ROS:  General: Negative for anorexia, weight loss, fever, chills, fatigue, weakness. ENT: Negative for hoarseness, difficulty swallowing , nasal congestion. CV: Negative for chest pain, angina, palpitations, dyspnea on exertion, peripheral edema.  Respiratory: Negative for dyspnea at rest, dyspnea on exertion, cough, sputum, wheezing.  GI: See history of present illness. GU:  Negative for dysuria, hematuria, urinary incontinence, urinary frequency, nocturnal urination.  Endo: Negative for unusual weight change.    Physical Examination:   BP (!) 172/85   Pulse 97   Temp 98.1 F (36.7 C) (Temporal)   Ht 5\' 11"  (1.803 m)   Wt 194 lb 3.2 oz (88.1 kg)   BMI 27.09 kg/m   General: Well-nourished, well-developed in no acute distress.  Eyes: No icterus. Mouth: masked Abdomen: Bowel sounds are normal, nontender, nondistended, no hepatosplenomegaly or masses, no abdominal bruits or hernia , no rebound or guarding.   Extremities: No lower extremity edema. No clubbing or deformities. Neuro: Alert and oriented x 4   Skin: Warm and dry, no jaundice.   Psych: Alert and cooperative, normal mood and affect.  Labs:  Lab Results  Component Value Date   WBC 6.0 04/21/2020   HGB 15.8 04/21/2020   HCT 49.2 04/21/2020   MCV 85.9 04/21/2020   PLT 192 04/21/2020   Lab Results  Component Value Date   CREATININE 1.61 (H) 11/05/2019   BUN 47 (H) 11/05/2019   NA 137 11/05/2019   K 4.7 11/05/2019   CL 103 11/05/2019   CO2 28  11/05/2019   Lab Results  Component Value Date   ALT 16 11/05/2019   AST 18 11/05/2019   ALKPHOS 60 11/05/2019   BILITOT 0.6 11/05/2019   Lab Results  Component Value Date   IRON 61 04/11/2020   TIBC 329 04/11/2020   FERRITIN 33 04/11/2020     Imaging Studies: No results found.   Impression/plan:  69 year old male with extensive head and neck cancer status post resection and radiation as outlined above, presenting for follow-up of GI bleeding.  Since we last saw him he had his feeding tube removed.  He has been able to maintain  his weight over the few couple of months.  Overall feeling well.  Reflux well controlled.  Denies any blood in the stool or melena.  He continues Plavix and aspirin daily but no other NSAIDs.  No longer on aspirin powders.  Iron deficiency anemia resolving.  Hemoglobin now normal along with iron and ferritin.  Iron saturation is low normal.  We will continue oral iron for 1-2 more months.  Plan to recheck labs in 4 months.  Discussed with patient today.  Currently he is not interested in pursuing upper endoscopy for previous history of GI bleeding since he has been doing so well.  His next colonoscopy will be due in 2023, will need to reassess with regards to which facility this procedure can be done given history of extensive head and neck cancer/surgery/previous history of difficult intubation.  To return to the office in 6 months but call sooner if needed.

## 2020-04-29 NOTE — Patient Instructions (Signed)
1. Continue iron for 1-2 more months.  2. We will plan for labs in four months, we will send you a reminder when you are due.  3. Return to the office in six months.  4. Call if you see any black or bloody stools.

## 2020-08-04 ENCOUNTER — Other Ambulatory Visit: Payer: Self-pay

## 2020-08-04 DIAGNOSIS — D509 Iron deficiency anemia, unspecified: Secondary | ICD-10-CM

## 2020-08-29 LAB — CBC WITH DIFFERENTIAL/PLATELET
Absolute Monocytes: 598 cells/uL (ref 200–950)
Basophils Absolute: 49 cells/uL (ref 0–200)
Basophils Relative: 0.8 %
Eosinophils Absolute: 226 cells/uL (ref 15–500)
Eosinophils Relative: 3.7 %
HCT: 46.8 % (ref 38.5–50.0)
Hemoglobin: 15.4 g/dL (ref 13.2–17.1)
Lymphs Abs: 1055 cells/uL (ref 850–3900)
MCH: 27.8 pg (ref 27.0–33.0)
MCHC: 32.9 g/dL (ref 32.0–36.0)
MCV: 84.6 fL (ref 80.0–100.0)
MPV: 11.4 fL (ref 7.5–12.5)
Monocytes Relative: 9.8 %
Neutro Abs: 4172 cells/uL (ref 1500–7800)
Neutrophils Relative %: 68.4 %
Platelets: 267 10*3/uL (ref 140–400)
RBC: 5.53 10*6/uL (ref 4.20–5.80)
RDW: 14.1 % (ref 11.0–15.0)
Total Lymphocyte: 17.3 %
WBC: 6.1 10*3/uL (ref 3.8–10.8)

## 2020-08-29 LAB — IRON,TIBC AND FERRITIN PANEL
%SAT: 24 % (calc) (ref 20–48)
Ferritin: 91 ng/mL (ref 24–380)
Iron: 64 ug/dL (ref 50–180)
TIBC: 272 mcg/dL (calc) (ref 250–425)

## 2020-10-28 ENCOUNTER — Encounter: Payer: Self-pay | Admitting: Internal Medicine

## 2020-11-08 ENCOUNTER — Other Ambulatory Visit: Payer: Self-pay

## 2020-11-08 ENCOUNTER — Encounter: Payer: Self-pay | Admitting: Emergency Medicine

## 2020-11-08 ENCOUNTER — Ambulatory Visit
Admission: EM | Admit: 2020-11-08 | Discharge: 2020-11-08 | Disposition: A | Discharge status: Home / Self Care | Payer: Medicare Other | Attending: Emergency Medicine | Admitting: Emergency Medicine

## 2020-11-08 DIAGNOSIS — R21 Rash and other nonspecific skin eruption: Secondary | ICD-10-CM | POA: Diagnosis not present

## 2020-11-08 DIAGNOSIS — N481 Balanitis: Secondary | ICD-10-CM

## 2020-11-08 LAB — POCT FASTING CBG KUC MANUAL ENTRY: POCT Glucose (KUC): 164 mg/dL — AB (ref 70–99)

## 2020-11-08 MED ORDER — FLUCONAZOLE 200 MG PO TABS
ORAL_TABLET | ORAL | 0 refills | Status: DC
Start: 2020-11-08 — End: 2021-02-17

## 2020-11-08 NOTE — Discharge Instructions (Signed)
Blood glucose in office 164 Ensure genital cleansing with retraction of foreskin Twice daily bathing of the affected area with saline solution can be helpful Prescribed diflucan 200 mg once daily and then second dose 72 hours later Follow up with PCP for recheck Return or go to the ER if you have any new or worsening symptoms such as pain, increased redness or swelling of foreskin, inability to retract foreskin, etc..Marland Kitchen

## 2020-11-08 NOTE — ED Provider Notes (Signed)
[facility]   [id number] 11/08/20 Arrival Time: 1127   CC: "yeast infection"  SUBJECTIVE:  Colin Wagner is a 70 y.o. male who presents requesting yeast infection on penis x 1 day.  Hx concerning for DM.  Reports past yeast infection due to elevated sugar.  Denies alleviating or aggravating factors.  Denies pain or itching.  Describes as pimples.  Denies fever, chills, nausea, vomiting, abdominal or pelvic pain, testicular swelling or pain.     Denies concern for STDs at this time.  Partner without symptoms.  Is sexually active.    No LMP for male patient.  ROS: As per HPI.  All other pertinent ROS negative.     Past Medical History:  Diagnosis Date  . Alcohol abuse   . Alcoholic pancreatitis 9417   admission  . Chronic back pain   . Chronic pancreatitis ([place])    based on ct findings 2016  . COPD (chronic obstructive pulmonary disease) ([place])   . Diabetes mellitus    type 2  . Diverticulosis   . Gastritis   . GERD (gastroesophageal reflux disease)   . Headache   . History of radiation therapy 05/12/17- 06/22/17   Left cheek and bilateral neck/ 60 Gy in 30 fractions to gross disease  . Hyperlipidemia   . Hypertension   . 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 Peptic ulcer disease 1999   Per medical reports, no H pylori  . Pneumonia   . Renal cancer, left ([place]) 2012   he tells me that he has been released, ?and that he is free of cancer, and never had it to begin with.   . Right shoulder pain   . Testicular hypofunction    Past Surgical History:  Procedure Laterality Date  . CAROTID STENT  06/2018  . COLONOSCOPY  2003   Dr. [name], polyps  . COLONOSCOPY  2005   Dr. [name], multiple diverticula  . COLONOSCOPY  2008   Dr. [name], diverticulosis  . COLONOSCOPY WITH PROPOFOL N/A 10/21/2016   Dr. [name]: Diverticulosis, two 5-7 mm polyps removed. path-tubular adenomas.  Next colonoscopy in 5 years.  . ESOPHAGOGASTRODUODENOSCOPY      Multiple EGDs. 1999 EGD showed gastric ulcers, no H pylori and benign biopsies performed by Dr. [name]. 2001 gastric ulcer healed. Last EGD 2005 had gastritis.  . IR GASTROSTOMY TUBE REMOVAL  02/08/2020  . IR REPLACE G-TUBE SIMPLE WO FLUORO  03/20/2019  . IR REPLACE G-TUBE SIMPLE WO FLUORO  09/21/2019  . IR REPLC GASTRO/COLONIC TUBE PERCUT W/FLUORO  05/16/2018  . Left partial mandibulectomy, Scapular free flap reconstruction, selective neck dissection, tracheotomy, and resection of intraoral palate cancer. Left 03/01/2017   [facility]  . LUNG BIOPSY    . MOUTH SURGERY    . PARTIAL NEPHRECTOMY Left 2012  . POLYPECTOMY  10/21/2016   Procedure: POLYPECTOMY;  Surgeon: [name], MD;  Location: [facility] END[facility] SUITE;  Service: Endoscopy;;  hepatic flexure x2  . SURGERY OF LIP  06/2019   lip revision  . TRANSURETHRAL RESECTION OF PROSTATE N/A 03/01/2019   Procedure: TRANSURETHRAL RESECTION OF THE PROSTATE (TURP)WITH CYSTOSCOPY;  Surgeon: Raynelle Bring, MD;  Location: [facility] ORS;  Service: Urology;  Laterality: N/A;   No Known Allergies No current facility-administered medications on file prior to encounter.   Current Outpatient Medications on File Prior to Encounter  Medication Sig Dispense Refill  . aspirin EC 81  MG tablet Take 81 mg by mouth daily.    Marland Kitchen atorvastatin (LIPITOR) 40 MG tablet Take 40 mg by mouth daily at 6 PM.     . cetirizine (ZYRTEC) 10 MG tablet Take 10 mg by mouth daily.    . clopidogrel (PLAVIX) 75 MG tablet Take 75 mg by mouth daily.    . cyclobenzaprine (FLEXERIL) 10 MG tablet Take 10 mg by mouth 4 (four) times daily as needed.     . fluticasone (FLONASE) 50 MCG/ACT nasal spray Place 2 sprays into both nostrils daily.     Marland Kitchen glipiZIDE (GLUCOTROL XL) 2.5 MG 24 hr tablet Take 2.5 mg by mouth every morning.    . hydrochlorothiazide (HYDRODIURIL) 12.5 MG tablet Take 12.5 mg by mouth daily.    Marland Kitchen HYDROcodone-acetaminophen (NORCO) 7.5-325 MG tablet Take 1  tablet by mouth every 6 (six) hours as needed (pain). (Patient taking differently: Take 1 tablet by mouth every 6 (six) hours as needed for moderate pain. ) 60 tablet 0  . LEVEMIR FLEXTOUCH 100 UNIT/ML Pen Inject 40 Units into the skin at bedtime. X 6 more nights, then return back to 22 units at bedtime (Patient taking differently: Inject 26 Units into the skin at bedtime. X 6 more nights, then return back to 22 units at bedtime) 15 mL 11  . lidocaine (XYLOCAINE) 2 % solution Patient: Mix 1part 2% viscous lidocaine, 1part H20. Swish &/or swallow 75mL of diluted mixture, 47min before meals and at bedtime, up to QID (Patient taking differently: Use as directed 5 mLs in the mouth or throat 3 (three) times daily as needed for mouth pain. Patient: Mix 1part 2% viscous lidocaine, 1part H20. Swish &/or swallow.) 100 mL 5  . LORazepam (ATIVAN) 1 MG tablet Take 1 mg by mouth 4 (four) times daily as needed for anxiety.   5  . pantoprazole (PROTONIX) 40 MG tablet Take 40 mg by mouth daily.     Marland Kitchen senna (SENOKOT) 8.6 MG TABS tablet Take 1 tablet (8.6 mg total) by mouth at bedtime as needed for mild constipation. May be needed as pain medication can constipate. 120 each 1  . sildenafil (REVATIO) 20 MG tablet Take 20 mg by mouth as needed.    . Testosterone Cypionate 200 MG/ML SOLN Inject as directed every 14 (fourteen) days.    . traZODone (DESYREL) 50 MG tablet Take 100 mg by mouth at bedtime.     Marland Kitchen zinc gluconate 50 MG tablet Take 50 mg by mouth daily.     Social History   Socioeconomic History  . Marital status: Divorced    Spouse name: Not on file  . Number of children: 0  . Years of education: Not on file  . Highest education level: Not on file  Occupational History  . Not on file  Tobacco Use  . Smoking status: Former Smoker    Packs/day: 0.50    Years: 35.00    Pack years: 17.50  . Smokeless tobacco: Never Used  . Tobacco comment: quit 02-2017  Vaping Use  . Vaping Use: Never used  Substance  and Sexual Activity  . Alcohol use: No    Comment: quit 2000 but relapse in 2016. no etoh since hospitalized 2016.   . Drug use: No  . Sexual activity: Not Currently  Other Topics Concern  . 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   Patient with a history of smoking half  pack a day for 35+ years. Patient currently only smoking a few cigarettes per day.   The patient has history of alcohol abuse. Patient quit in 2000 and then relapsed in 2016. Patient has not had any alcohol since 2016.   Social Determinants of Health   Financial Resource Strain: Not on file  Food Insecurity: Not on file  Transportation Needs: Not on file  Physical Activity: Not on file  Stress: Not on file  Social Connections: Not on file  Intimate Partner Violence: Not on file   Family History  Problem Relation Age of Onset  . Hypertension Mother   . Colon cancer Neg Hx     OBJECTIVE:  Vitals:   11/08/20 1133  BP: 118/69  Pulse: 91  Resp: 18  Temp: 97.9 F (36.6 C)  TempSrc: Oral  SpO2: 93%     General appearance: alert, NAD, appears stated age Head: NCAT Throat: lips, mucosa, and tongue normal; teeth and gums normal Lungs: CTA bilaterally without adventitious breath sounds Heart: regular rate and rhythm.   Abdomen: soft, non-tender; bowel sounds normal; no guarding GU: Chaperone Jamey Reas RT present.  Uncircumcised male; pearly papules over shaft of penis in 9-1 o'clock position in sparse distribution Skin: warm and dry Psychological:  Alert and cooperative. Normal mood and affect.  LABS:  Results for orders placed or performed during the hospital encounter of 11/08/20  POCT CBG (manual entry)  Result Value Ref Range   POCT Glucose ([facility]) 164 (A) 70 - 99 mg/dL    Labs Reviewed  POCT FASTING CBG [facility] MANUAL ENTRY - Abnormal; Notable for the following components:      Result Value   POCT Glucose ([facility]) 164 (*)    All other  components within normal limits    ASSESSMENT & PLAN:  1. Rash of penis   2. Balanitis     Meds ordered this encounter  Medications  . fluconazole (DIFLUCAN) 200 MG tablet    Sig: Take one dose by mouth, wait 72 hours, and then take second dose by mouth    Dispense:  2 tablet    Refill:  0    Order Specific Question:   Supervising Provider    Answer:   Raylene Everts [[id number]]    Pending: Labs Reviewed  POCT FASTING CBG [place] - Abnormal; Notable for the following components:      Result Value   POCT Glucose ([facility]) 164 (*)    All other components within normal limits    Blood glucose in office 164 Ensure genital cleansing with retraction of foreskin Twice daily bathing of the affected area with saline solution can be helpful Prescribed diflucan 200 mg once daily and then second dose 72 hours later Follow up with PCP for recheck Return or go to the ER if you have any new or worsening symptoms such as pain, increased redness or swelling of foreskin, inability to retract foreskin, etc...    Reviewed expectations re: course of current medical issues. Questions answered. Outlined signs and symptoms indicating need for more acute intervention. Patient verbalized understanding. After Visit Summary given.       Lestine Box, PA-C 11/08/20 1153

## 2020-11-08 NOTE — ED Triage Notes (Signed)
States he has a yeast infection on his penis x 1 day.

## 2020-12-02 ENCOUNTER — Encounter: Payer: Self-pay | Admitting: Internal Medicine

## 2020-12-02 ENCOUNTER — Ambulatory Visit (INDEPENDENT_AMBULATORY_CARE_PROVIDER_SITE_OTHER): Payer: Medicare Other | Admitting: Internal Medicine

## 2020-12-02 ENCOUNTER — Other Ambulatory Visit: Payer: Self-pay

## 2020-12-02 VITALS — BP 133/73 | HR 92 | Temp 97.8°F | Ht 71.0 in | Wt 181.8 lb

## 2020-12-02 DIAGNOSIS — R1319 Other dysphagia: Secondary | ICD-10-CM | POA: Diagnosis not present

## 2020-12-02 DIAGNOSIS — D509 Iron deficiency anemia, unspecified: Secondary | ICD-10-CM

## 2020-12-02 DIAGNOSIS — R1310 Dysphagia, unspecified: Secondary | ICD-10-CM

## 2020-12-02 DIAGNOSIS — D649 Anemia, unspecified: Secondary | ICD-10-CM

## 2020-12-02 LAB — CBC WITH DIFFERENTIAL/PLATELET
Absolute Monocytes: 601 cells/uL (ref 200–950)
Basophils Absolute: 39 cells/uL (ref 0–200)
Basophils Relative: 0.5 %
Eosinophils Absolute: 193 cells/uL (ref 15–500)
Eosinophils Relative: 2.5 %
HCT: 46 % (ref 38.5–50.0)
Hemoglobin: 14.8 g/dL (ref 13.2–17.1)
Lymphs Abs: 1140 cells/uL (ref 850–3900)
MCH: 27.4 pg (ref 27.0–33.0)
MCHC: 32.2 g/dL (ref 32.0–36.0)
MCV: 85.2 fL (ref 80.0–100.0)
MPV: 11.3 fL (ref 7.5–12.5)
Monocytes Relative: 7.8 %
Neutro Abs: 5729 cells/uL (ref 1500–7800)
Neutrophils Relative %: 74.4 %
Platelets: 219 10*3/uL (ref 140–400)
RBC: 5.4 10*6/uL (ref 4.20–5.80)
RDW: 15.1 % — ABNORMAL HIGH (ref 11.0–15.0)
Total Lymphocyte: 14.8 %
WBC: 7.7 10*3/uL (ref 3.8–10.8)

## 2020-12-02 NOTE — Patient Instructions (Addendum)
Continue Protonix 40 mg daily  I am glad you no longer use alcohol or NSAIDs like ibuprofen and aspirin  We will order a barium pill esophagram to further evaluate your swallowing difficulties (pill and esophageal dysphagia in a patient with a history of head neck cancer)  You will need a colonoscopy to follow-up on polyps in 2023  CBC today  Further recommendations to follow once barium pill esophagram is available for review.

## 2020-12-02 NOTE — Progress Notes (Signed)
Primary Care Physician:  [name], MD Primary Gastroenterologist:  Dr. [name]  Pre-Procedure History & Physical: HPI:  Colin Wagner is a 70 y.o. male here for follow-up.  History of GI bleeding secondary to NSAID. gastropathy/peptic ulcer disease.  Last seen here about 6 months ago.  Clinically was doing well.  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  He speaks in the raspy voice.  He had a PEG tube for some time which was previously removed.  He is able to eat and drink fluids fairly well although he has trouble swallowing meat and pills from time to time. He has not had an EGD since his head neck cancer diagnosis although it was previously contemplated due to bleeding; ultimately not done because he clinically improved. He states his reflux symptoms well controlled on Protonix 40 mg daily.  He is not drinking any alcohol. Prior upper endoscopy post head and neck cancer treatment avoided due to concerns with airway management. Intubated for prostate surgery at [facility] a couple years ago without difficulty. Patient is also due for surveillance colonoscopy 2023 (given history colonic polyps)   Past Medical History:  Diagnosis Date  . Alcohol abuse   . Alcoholic pancreatitis 4580   admission  . Chronic back pain   . Chronic pancreatitis ([place])    based on ct findings 2016  . COPD (chronic obstructive pulmonary disease) ([place])   . Diabetes mellitus    type 2  . Diverticulosis   . Gastritis   . GERD (gastroesophageal reflux disease)   . Headache   . History of radiation therapy 05/12/17- 06/22/17   Left cheek and bilateral neck/ 60 Gy in 30 fractions to gross disease  . Hyperlipidemia   . Hypertension   . 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 Peptic ulcer disease 1999   Per medical reports, no H pylori  . Pneumonia   . Renal cancer, left ([place]) 2012   he tells me that he has been released, ?and that he is free of  cancer, and never had it to begin with.   . Right shoulder pain   . Testicular hypofunction     Past Surgical History:  Procedure Laterality Date  . CAROTID STENT  06/2018  . COLONOSCOPY  2003   Dr. [name], polyps  . COLONOSCOPY  2005   Dr. [name], multiple diverticula  . COLONOSCOPY  2008   Dr. [name], diverticulosis  . COLONOSCOPY WITH PROPOFOL N/A 10/21/2016   Dr. [name]: Diverticulosis, two 5-7 mm polyps removed. path-tubular adenomas.  Next colonoscopy in 5 years.  . ESOPHAGOGASTRODUODENOSCOPY     Multiple EGDs. 1999 EGD showed gastric ulcers, no H pylori and benign biopsies performed by Dr. [name]. 2001 gastric ulcer healed. Last EGD 2005 had gastritis.  . IR GASTROSTOMY TUBE REMOVAL  02/08/2020  . IR REPLACE G-TUBE SIMPLE WO FLUORO  03/20/2019  . IR REPLACE G-TUBE SIMPLE WO FLUORO  09/21/2019  . IR REPLC GASTRO/COLONIC TUBE PERCUT W/FLUORO  05/16/2018  . Left partial mandibulectomy, Scapular free flap reconstruction, selective neck dissection, tracheotomy, and resection of intraoral palate cancer. Left 03/01/2017   [facility]  . LUNG BIOPSY    . MOUTH SURGERY    . PARTIAL NEPHRECTOMY Left 2012  . POLYPECTOMY  10/21/2016   Procedure: POLYPECTOMY;  Surgeon: [name], MD;  Location: [facility];  Service: Endoscopy;;  hepatic flexure x2  . SURGERY OF LIP  06/2019   lip revision  . TRANSURETHRAL RESECTION OF PROSTATE N/A 03/01/2019   Procedure: TRANSURETHRAL RESECTION OF THE PROSTATE (TURP)WITH CYSTOSCOPY;  Surgeon: Raynelle Bring, MD;  Location: [facility] ORS;  Service: Urology;  Laterality: N/A;    Prior to Admission medications   Medication Sig Start Date End Date Taking? Authorizing Provider  aspirin EC 81 MG tablet Take 81 mg by mouth daily.   Yes [provider]  atorvastatin (LIPITOR) 40 MG tablet Take 40 mg by mouth daily at 6 PM.  02/14/17  Yes [provider]  cetirizine (ZYRTEC) 10 MG tablet Take 10 mg by mouth  daily.   Yes [provider]  clopidogrel (PLAVIX) 75 MG tablet Take 75 mg by mouth daily. 05/27/19  Yes [provider]  cyclobenzaprine (FLEXERIL) 10 MG tablet Take 10 mg by mouth 4 (four) times daily as needed.  09/06/18  Yes [provider]  fluconazole (DIFLUCAN) 200 MG tablet Take one dose by mouth, wait 72 hours, and then take second dose by mouth 11/08/20  Yes Wurst, [place], PA-C  fluticasone (FLONASE) 50 MCG/ACT nasal spray Place 2 sprays into both nostrils daily.  08/04/18  Yes [provider]  glipiZIDE (GLUCOTROL XL) 2.5 MG 24 hr tablet Take 2.5 mg by mouth every morning. 08/11/18  Yes [provider]  hydrochlorothiazide (HYDRODIURIL) 12.5 MG tablet Take 12.5 mg by mouth daily. 04/24/19  Yes [provider]  HYDROcodone-acetaminophen (NORCO) 7.5-325 MG tablet Take 1 tablet by mouth every 6 (six) hours as needed (pain). Patient taking differently: Take 1 tablet by mouth every 6 (six) hours as needed for moderate pain. 01/12/17  Yes [name], MD  LEVEMIR FLEXTOUCH 100 UNIT/ML Pen Inject 40 Units into the skin at bedtime. X 6 more nights, then return back to 22 units at bedtime Patient taking differently: Inject 26 Units into the skin at bedtime. X 6 more nights, then return back to 22 units at bedtime 07/31/19  Yes Tat, David, MD  lidocaine (XYLOCAINE) 2 % solution Patient: Mix 1part 2% viscous lidocaine, 1part H20. Swish &/or swallow 74mL of diluted mixture, 41min before meals and at bedtime, up to QID Patient taking differently: Use as directed 5 mLs in the mouth or throat 3 (three) times daily as needed for mouth pain. Patient: Mix 1part 2% viscous lidocaine, 1part H20. Swish &/or swallow. 04/14/18  Yes [name], MD  LORazepam (ATIVAN) 1 MG tablet Take 1 mg by mouth 4 (four) times daily as needed for anxiety.  03/31/15  Yes [provider]  pantoprazole (PROTONIX) 40 MG tablet Take 40 mg by mouth daily.    Yes [provider]  senna (SENOKOT) 8.6 MG TABS tablet Take 1 tablet (8.6 mg total) by mouth at bedtime as needed for mild constipation. May be needed as pain medication can constipate. 01/12/17  Yes [name], MD  sildenafil (REVATIO) 20 MG tablet Take 20 mg by mouth as needed.   Yes [provider]  Testosterone Cypionate 200 MG/ML SOLN Inject as directed every 14 (fourteen) days.   Yes [provider]  traZODone (DESYREL) 50 MG tablet Take 100 mg by mouth at bedtime.    Yes [provider]  zinc gluconate 50 MG tablet Take 50 mg by mouth daily.   Yes [provider]    Allergies as of 12/02/2020  . (No Known Allergies)    Family History  Problem Relation Age of Onset  .  Hypertension Mother   . Colon cancer Neg Hx     Social History   Socioeconomic History  . Marital status: Divorced    Spouse name: Not on file  . Number of children: 0  . Years of education: Not on file  . Highest education level: Not on file  Occupational History  . Not on file  Tobacco Use  . Smoking status: Former Smoker    Packs/day: 0.50    Years: 35.00    Pack years: 17.50  . Smokeless tobacco: Never Used  . Tobacco comment: quit 02-2017  Vaping Use  . Vaping Use: Never used  Substance and Sexual Activity  . Alcohol use: No    Comment: quit 2000 but relapse in 2016. no etoh since hospitalized 2016.   . Drug use: No  . Sexual activity: Not Currently  Other Topics Concern  . 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   Patient with a history of smoking half pack a day for 35+ years. Patient currently only smoking a few cigarettes per day.   The patient has history of alcohol abuse. Patient quit in 2000 and then relapsed in 2016. Patient has not had any alcohol since 2016.   Social Determinants of Health   Financial Resource Strain: Not on file  Food Insecurity: Not on file  Transportation Needs:  Not on file  Physical Activity: Not on file  Stress: Not on file  Social Connections: Not on file  Intimate Partner Violence: Not on file    Review of Systems: See HPI, otherwise negative ROS  Physical Exam: BP 133/73   Pulse 92   Temp 97.8 F (36.6 C)   Ht 5\' 11"  (1.803 m)   Wt 181 lb 12.8 oz (82.5 kg)   BMI 25.36 kg/m  General:   Alert,   pleasant and cooperative in NAD.  Speaks in a raspy voice. Neck:  Supple; no masses or thyromegaly. No significant cervical adenopathy. Lungs:  Clear throughout to auscultation.   No wheezes, crackles, or rhonchi. No acute distress. Heart:  Regular rate and rhythm; no murmurs, clicks, rubs,  or gallops. Abdomen: Nondistended.  Positive bowel sounds.  Surgical trocar/PEG scars well-healed.  Abdomen is entirely soft and nontender without appreciable mass organomegaly.   Pulses:  Normal pulses noted. Extremities:  Without clubbing or edema.  Impression/Plan: Pleasant 70 year old gentleman with a history of head neck cancer -  status post resection and radiation therapy He has a history of NSAID gastropathy with bleeding.  He has clinically done well with cessation of NSAID's and alcohol. Protonix 40 mg once daily controls his GERD.  He does relate esophageal dysphagia -sounds more esophageal in origin than oropharyngeal. Concerns of technical challenges with esophageal and endotracheal intubation post surgery throughout the chart.  However, it sounds like he did well with endotracheal intubation for prostate surgery about 2 years ago.  He is due to have his hemoglobin checked today.  He will be due for a surveillance colonoscopy (history of colonic polyps) 2023.   Recommendations:  Continue Protonix 40 mg daily  I am glad patient no longer using alcohol or NSAIDs like ibuprofen and aspirin  We will order a barium pill esophagram to further evaluate swallowing difficulties (pill and esophageal dysphagia in a patient with a history of head  neck cancer)  Plan for colonoscopy to follow-up on polyps in 2023  CBC today  Further recommendations to  follow once barium pill esophagram is available for review.  We will consult with anesthesia locally prior to scheduling any future endoscopic procedures at our facility.  Further recommendations to follow.            Notice: This dictation was prepared with Dragon dictation along with smaller phrase technology. Any transcriptional errors that result from this process are unintentional and may not be corrected upon review.

## 2020-12-05 ENCOUNTER — Ambulatory Visit (HOSPITAL_COMMUNITY)
Admission: RE | Admit: 2020-12-05 | Discharge: 2020-12-05 | Disposition: A | Discharge status: Home / Self Care | Payer: Medicare Other | Source: Ambulatory Visit | Attending: Internal Medicine | Admitting: Internal Medicine

## 2020-12-05 DIAGNOSIS — R1310 Dysphagia, unspecified: Secondary | ICD-10-CM | POA: Diagnosis not present

## 2020-12-08 ENCOUNTER — Telehealth (INDEPENDENT_AMBULATORY_CARE_PROVIDER_SITE_OTHER): Payer: Self-pay | Admitting: Gastroenterology

## 2020-12-08 NOTE — Telephone Encounter (Signed)
Received a phone call during the weekend regarding findings in MBS showing significant laryngeal penetration and silent aspiration of barium.  We will notify primary gastroenterologist Dr. [name].

## 2020-12-09 NOTE — Telephone Encounter (Signed)
Thanks for the follow-up.  We are referring to speech pathology for evaluation as the next step in management.

## 2020-12-11 ENCOUNTER — Other Ambulatory Visit: Payer: Self-pay | Admitting: *Deleted

## 2020-12-11 DIAGNOSIS — R1310 Dysphagia, unspecified: Secondary | ICD-10-CM

## 2020-12-11 DIAGNOSIS — K219 Gastro-esophageal reflux disease without esophagitis: Secondary | ICD-10-CM

## 2020-12-12 ENCOUNTER — Other Ambulatory Visit (HOSPITAL_COMMUNITY): Payer: Self-pay | Admitting: Specialist

## 2020-12-12 ENCOUNTER — Encounter: Payer: Self-pay | Admitting: Radiation Oncology

## 2020-12-12 ENCOUNTER — Ambulatory Visit
Admission: RE | Admit: 2020-12-12 | Discharge: 2020-12-12 | Disposition: A | Discharge status: Home / Self Care | Payer: Medicare Other | Source: Ambulatory Visit | Attending: Radiation Oncology | Admitting: Radiation Oncology

## 2020-12-12 ENCOUNTER — Other Ambulatory Visit: Payer: Self-pay

## 2020-12-12 VITALS — BP 107/73 | HR 83 | Temp 96.8°F | Resp 20 | Ht 71.0 in | Wt 184.4 lb

## 2020-12-12 DIAGNOSIS — Z79899 Other long term (current) drug therapy: Secondary | ICD-10-CM | POA: Insufficient documentation

## 2020-12-12 DIAGNOSIS — R1310 Dysphagia, unspecified: Secondary | ICD-10-CM | POA: Insufficient documentation

## 2020-12-12 DIAGNOSIS — Z923 Personal history of irradiation: Secondary | ICD-10-CM | POA: Insufficient documentation

## 2020-12-12 DIAGNOSIS — R634 Abnormal weight loss: Secondary | ICD-10-CM

## 2020-12-12 DIAGNOSIS — K224 Dyskinesia of esophagus: Secondary | ICD-10-CM | POA: Insufficient documentation

## 2020-12-12 DIAGNOSIS — C411 Malignant neoplasm of mandible: Secondary | ICD-10-CM

## 2020-12-12 DIAGNOSIS — Z7982 Long term (current) use of aspirin: Secondary | ICD-10-CM | POA: Insufficient documentation

## 2020-12-12 DIAGNOSIS — Z1329 Encounter for screening for other suspected endocrine disorder: Secondary | ICD-10-CM

## 2020-12-12 DIAGNOSIS — R682 Dry mouth, unspecified: Secondary | ICD-10-CM | POA: Diagnosis not present

## 2020-12-12 DIAGNOSIS — C060 Malignant neoplasm of cheek mucosa: Secondary | ICD-10-CM

## 2020-12-12 DIAGNOSIS — Z8589 Personal history of malignant neoplasm of other organs and systems: Secondary | ICD-10-CM | POA: Insufficient documentation

## 2020-12-12 DIAGNOSIS — R1319 Other dysphagia: Secondary | ICD-10-CM

## 2020-12-12 DIAGNOSIS — K219 Gastro-esophageal reflux disease without esophagitis: Secondary | ICD-10-CM

## 2020-12-12 LAB — TSH: TSH: 2.738 u[IU]/mL (ref 0.320–4.118)

## 2020-12-12 NOTE — Progress Notes (Addendum)
Radiation Oncology         [phone] ________________________________  Name: Colin Wagner MRN: [id number]  Date: 12/12/2020  DOB: 27-Jan-1951  Follow-Up Visit Note face-to-face  CC: [name], MD  [name], MD  Diagnosis and Prior Radiotherapy:     C41.1    ICD-10-CM   1. 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 presents for follow up of radiation completed10/31/2018 to his left cheek and bilateral neck  Pain issues, if any: Reports occasional discomfort along the sides of his neck. Describes it as a tightness that makes it difficult to turn his head from side to side and considers this his new normal Using a feeding tube?: N/A--removed 02/08/2020 Weight changes, if any:  Wt Readings from Last 3 Encounters:  12/12/20 184 lb 6 oz (83.6 kg)  12/02/20 181 lb 12.8 oz (82.5 kg)  04/29/20 194 lb 3.2 oz (88.1 kg)   Swallowing issues, if any: Yes--reports food/liquids sometimes "go down the wrong way" or he has to double swallow to ensure item goes down completely. GI doctor ordered barium pill esophagram to evaluate swallowing difficulties  12/05/2020 IMPRESSION: --Premature termination of esophagram due to presence of significant laryngeal penetration and silent aspiration of contrast into the proximal trachea. --12.5 mm diameter barium tablet lodged in vallecular and could not be swallowed. --Consider speech therapy evaluation (referral placed by Dr. Manus Rudd on 12/11/2020).  Smoking or chewing tobacco? None Using fluoride trays daily? N/A--does see a dentist  every 6 months Last ENT visit was on: Not since previous visit with Dr. Heath Lark on 08/07/2019. Reports he's trying to get established with another provider  Other notable issues, if any: Denies any ear or jaw pain, or difficulty opening his mouth fully. Main symptom is the tightness to sides of his neck. Continues to deal with dry mouth; reports he uses Act mouthwash with fluoride to help manage symptoms.  He recently became engaged!  His fiance is with him again today.  Vitals:   12/12/20 1009  BP: 107/73  Pulse: 83  Resp: 20  Temp: (!) 96.8 F (36 C)  SpO2: 96%       ALLERGIES:  has No Known Allergies.  Meds: Current Outpatient Medications  Medication Sig Dispense Refill  . amLODipine (NORVASC) 5 MG tablet Take 1 tablet by mouth daily.    Marland Kitchen aspirin EC 81 MG tablet Take 81 mg by mouth daily.    Marland Kitchen atorvastatin (LIPITOR) 40 MG tablet Take 40 mg by mouth daily at 6 PM.     . cetirizine (ZYRTEC) 10 MG tablet Take 10 mg by mouth daily.    . clopidogrel (PLAVIX) 75 MG tablet Take 75 mg by mouth daily.    . cyclobenzaprine (FLEXERIL) 10 MG tablet Take 10 mg by mouth 4 (four) times daily as needed.     . fluconazole (DIFLUCAN) 200 MG tablet Take one dose by mouth, wait 72 hours, and then take second dose by mouth 2 tablet 0  . fluticasone (FLONASE) 50 MCG/ACT nasal  spray Place 2 sprays into both nostrils daily.     Marland Kitchen glipiZIDE (GLUCOTROL XL) 2.5 MG 24 hr tablet Take 2.5 mg by mouth every morning.    . hydrochlorothiazide (HYDRODIURIL) 12.5 MG tablet Take 12.5 mg by mouth daily.    Marland Kitchen HYDROcodone-acetaminophen (NORCO) 7.5-325 MG tablet Take 1 tablet by mouth every 6 (six) hours as needed (pain). (Patient taking differently: Take 1 tablet by mouth every 6 (six) hours as needed for moderate pain.) 60 tablet 0  . LEVEMIR FLEXTOUCH 100 UNIT/ML Pen Inject 40 Units into the skin at bedtime. X 6 more nights, then return back to 22 units at bedtime (Patient taking differently: Inject 26 Units  into the skin at bedtime. X 6 more nights, then return back to 22 units at bedtime) 15 mL 11  . lidocaine (XYLOCAINE) 2 % solution Patient: Mix 1part 2% viscous lidocaine, 1part H20. Swish &/or swallow 7mL of diluted mixture, 94min before meals and at bedtime, up to QID (Patient taking differently: Use as directed 5 mLs in the mouth or throat 3 (three) times daily as needed for mouth pain. Patient: Mix 1part 2% viscous lidocaine, 1part H20. Swish &/or swallow.) 100 mL 5  . lisinopril (ZESTRIL) 5 MG tablet Take 5 mg by mouth daily.    Marland Kitchen LORazepam (ATIVAN) 1 MG tablet Take 1 mg by mouth 4 (four) times daily as needed for anxiety.   5  . pantoprazole (PROTONIX) 40 MG tablet Take 40 mg by mouth daily.     Marland Kitchen senna (SENOKOT) 8.6 MG TABS tablet Take 1 tablet (8.6 mg total) by mouth at bedtime as needed for mild constipation. May be needed as pain medication can constipate. 120 each 1  . sildenafil (REVATIO) 20 MG tablet Take 20 mg by mouth as needed.    . Testosterone Cypionate 200 MG/ML SOLN Inject as directed every 14 (fourteen) days.    . traZODone (DESYREL) 50 MG tablet Take 100 mg by mouth at bedtime.     Marland Kitchen zinc gluconate 50 MG tablet Take 50 mg by mouth daily.     No current facility-administered medications for this encounter.    Physical Findings: Wt Readings from Last 3 Encounters:  12/12/20 184 lb 6 oz (83.6 kg)  12/02/20 181 lb 12.8 oz (82.5 kg)  04/29/20 194 lb 3.2 oz (88.1 kg)    height is 5\' 11"  (1.803 m) and weight is 184 lb 6 oz (83.6 kg). His temporal temperature is 96.8 F (36 C) (abnormal). His blood pressure is 107/73 and his pulse is 83. His respiration is 20 and oxygen saturation is 96%.   General: Alert and oriented, in no acute distress.  His speech is still hard to understand. HEENT: No oral or upper throat lesions. Lymphedema in the face is mild to moderate, stable.  Neck:  He has fibrosis concentrated in the left neck consistent with prior surgery and radiation. No  palpable masses noted.  He is able to turn his head and neck side to side but does have some limited range of motion. Skin: Over face and neck his skin is dry, intact  Psych: appropriate affect, judgment appears intact.     Lab Findings: Lab Results  Component Value Date   WBC 7.7 12/02/2020   HGB 14.8 12/02/2020   HCT 46.0 12/02/2020   MCV 85.2 12/02/2020   PLT 219 12/02/2020    Lab Results  Component Value Date   TSH 2.738 12/12/2020    Radiographic Findings: DG ESOPHAGUS W  DOUBLE CM (HD)  Result Date: 12/05/2020 CLINICAL DATA:  Dysphagia, difficulty swallowing pills and solid foods, history of head neck cancer post surgery and radiation therapy EXAM: ESOPHOGRAM/BARIUM SWALLOW TECHNIQUE: Single contrast examination was performed using thick barium and effervescent granules. FLUOROSCOPY TIME:  Fluoroscopy Time:  1 minutes 6 seconds Radiation Exposure Index (if provided by the fluoroscopic device): 23.8 mGy Number of Acquired Spot Images: multiple fluoroscopic screen captures COMPARISON:  None FINDINGS: Subjectively esophagus distends normally without obvious mass or stricture. No persistent intraluminal filling defects seen. Diffuse age-related esophageal dysmotility noted. With initial several swallows of barium, patient demonstrated significant laryngeal penetration and aspiration of contrast, with contrast running down anterior wall of proximal thoracic esophagus. No spontaneous cough reflex. Administration of barium was terminated at this point. Patient attempted to swallow a 12.5 mm diameter barium tablet this became lodged in the vallecula and patient had to cough it out. IMPRESSION: Premature termination of esophagram due to presence of significant laryngeal penetration and silent aspiration of contrast into the proximal trachea. 12.5 mm diameter barium tablet lodged in vallecular and could not be swallowed. Consider speech therapy evaluation. These results will be called to the  ordering clinician or representative by the Radiologist Assistant, and communication documented in the PACS or [employer]. Electronically Signed   By: Lavonia Dana M.D.   On: 12/05/2020 09:16    Impression/Plan:    1) Head and Neck Cancer Status: NED   2) Nutritional Status: Decreased since PEG removed but tolerating normal food  Wt Readings from Last 3 Encounters:  12/12/20 184 lb 6 oz (83.6 kg)  12/02/20 181 lb 12.8 oz (82.5 kg)  04/29/20 194 lb 3.2 oz (88.1 kg)    3) Risk Factors: The patient has been educated about risk factors including alcohol and tobacco abuse; they understand that avoidance of alcohol and tobacco is important to prevent recurrences as well as other cancers. The patient is not using tobacco.    4) Swallowing: Based on recent swallowing study the patient is getting referred back to speech-language pathology ([place])  5) Dental: Encouraged to continue regular followup with dentistry, and dental hygiene including fluoride and flossing.   6) Thyroid function: Within normal limits , continue checking annually Lab Results  Component Value Date   TSH 2.738 12/12/2020    7) Other:  Continue to follow with otolaryngology.  They have been lost to follow-up and I recommended they call to make an appointment.  I will see him back in 1 year   On date of service, in total, I spent 32 minutes on this encounter. Patient was seen in person.  _____________________________________   [name], MD

## 2020-12-12 NOTE — Progress Notes (Signed)
Colin Wagner presents for follow up of radiation completed10/31/2018 to his left cheek and bilateral neck  Pain issues, if any: Reports occasional discomfort along the sides of his neck. Describes it as a tightness that makes it difficult to turn his head from side to side Using a feeding tube?: N/A--removed 02/08/2020 Weight changes, if any:  Wt Readings from Last 3 Encounters:  12/12/20 184 lb 6 oz (83.6 kg)  12/02/20 181 lb 12.8 oz (82.5 kg)  04/29/20 194 lb 3.2 oz (88.1 kg)   Swallowing issues, if any: Yes--reports food/liquids sometimes "go down the wrong way" or he has to double swallow to ensure item goes down completely. GI doctor ordered barium pill esophagram to evaluate swallowing difficulties  12/05/2020 IMPRESSION: --Premature termination of esophagram due to presence of significant laryngeal penetration and silent aspiration of contrast into the proximal trachea. --12.5 mm diameter barium tablet lodged in vallecular and could not be swallowed. --Consider speech therapy evaluation (referral placed by Dr. Manus Rudd on 12/11/2020).  Smoking or chewing tobacco? None Using fluoride trays daily? N/A--does see a dentist every 6 months Last ENT visit was on: Not since previous visit with Dr. Heath Lark on 08/07/2019. Reports he's trying to get established with another provider  Other notable issues, if any: Denies any ear or jaw pain, or difficulty opening his mouth fully. Main symptom is the tightness to sides of his neck. Continues to deal with dry mouth; reports he uses Act mouthwash with fluoride to help manage symptoms.    Vitals:   12/12/20 1009  BP: 107/73  Pulse: 83  Resp: 20  Temp: (!) 96.8 F (36 C)  SpO2: 96%

## 2020-12-15 ENCOUNTER — Other Ambulatory Visit: Payer: Self-pay

## 2020-12-15 ENCOUNTER — Ambulatory Visit (HOSPITAL_COMMUNITY)
Admission: RE | Admit: 2020-12-15 | Discharge: 2020-12-15 | Disposition: A | Discharge status: Home / Self Care | Payer: Medicare Other | Source: Ambulatory Visit | Attending: Internal Medicine | Admitting: Internal Medicine

## 2020-12-15 ENCOUNTER — Ambulatory Visit (HOSPITAL_COMMUNITY): Payer: Medicare Other | Attending: Internal Medicine | Admitting: Speech Pathology

## 2020-12-15 ENCOUNTER — Encounter (HOSPITAL_COMMUNITY): Payer: Self-pay | Admitting: Speech Pathology

## 2020-12-15 DIAGNOSIS — R1319 Other dysphagia: Secondary | ICD-10-CM | POA: Insufficient documentation

## 2020-12-15 DIAGNOSIS — K219 Gastro-esophageal reflux disease without esophagitis: Secondary | ICD-10-CM | POA: Insufficient documentation

## 2020-12-15 DIAGNOSIS — R1312 Dysphagia, oropharyngeal phase: Secondary | ICD-10-CM | POA: Insufficient documentation

## 2020-12-15 NOTE — Progress Notes (Signed)
Oncology Nurse Navigator Documentation  At Dr. Pearlie Oyster request I have scheduled Colin Wagner to see an Youth worker at [facility] for continued follow up to Colin past treatment for head and neck cancer. He is scheduled to see Dr. Avon Gully on 01/16/21 at 11:30. I have called and spoken to Colin Wagner and she is aware of the appointment and agreeable to the date and time.   Harlow Asa RN, BSN, OCN Head & Neck Oncology Nurse [place] at [facility] Phone # [phone]  Fax # [phone]

## 2020-12-15 NOTE — Therapy (Signed)
[place], [place], [address] Phone: [phone]   Fax:  [phone]  Modified Barium Swallow  Patient Details  Name: Colin Wagner MRN: [id number] Date of Birth: 1951/03/01 No data recorded  Encounter Date: 12/15/2020   End of Session - 12/15/20 1450    Visit Number 1    Number of Visits 6    Date for SLP Re-Evaluation 02/05/21    Authorization Type Medicare    SLP Start Time 86    SLP Stop Time  1200    SLP Time Calculation (min) 30 min    Activity Tolerance Patient tolerated treatment well           Past Medical History:  Diagnosis Date  . Alcohol abuse   . Alcoholic pancreatitis 0923   admission  . Chronic back pain   . Chronic pancreatitis ([place])    based on ct findings 2016  . COPD (chronic obstructive pulmonary disease) ([place])   . Diabetes mellitus    type 2  . Diverticulosis   . Gastritis   . GERD (gastroesophageal reflux disease)   . Headache   . History of radiation therapy 05/12/17- 06/22/17   Left cheek and bilateral neck/ 60 Gy in 30 fractions to gross disease  . Hyperlipidemia   . Hypertension   . 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 Peptic ulcer disease 1999   Per medical reports, no H pylori  . Pneumonia   . Renal cancer, left ([place]) 2012   he tells me that he has been released, ?and that he is free of cancer, and never had it to begin with.   . Right shoulder pain   . Testicular hypofunction     Past Surgical History:  Procedure Laterality Date  . CAROTID STENT  06/2018  . COLONOSCOPY  2003   Dr. [name], polyps  . COLONOSCOPY  2005   Dr. [name], multiple diverticula  . COLONOSCOPY  2008   Dr. [name], diverticulosis  . COLONOSCOPY WITH PROPOFOL N/A 10/21/2016   Dr. [name]: Diverticulosis, two 5-7 mm polyps removed. path-tubular adenomas.  Next colonoscopy in 5 years.  . ESOPHAGOGASTRODUODENOSCOPY     Multiple EGDs. 1999 EGD showed gastric  ulcers, no H pylori and benign biopsies performed by Dr. [name]. 2001 gastric ulcer healed. Last EGD 2005 had gastritis.  . IR GASTROSTOMY TUBE REMOVAL  02/08/2020  . IR REPLACE G-TUBE SIMPLE WO FLUORO  03/20/2019  . IR REPLACE G-TUBE SIMPLE WO FLUORO  09/21/2019  . IR REPLC GASTRO/COLONIC TUBE PERCUT W/FLUORO  05/16/2018  . Left partial mandibulectomy, Scapular free flap reconstruction, selective neck dissection, tracheotomy, and resection of intraoral palate cancer. Left 03/01/2017   [facility]  . LUNG BIOPSY    . MOUTH SURGERY    . PARTIAL NEPHRECTOMY Left 2012  . POLYPECTOMY  10/21/2016   Procedure: POLYPECTOMY;  Surgeon: [name], MD;  Location: [facility];  Service: Endoscopy;;  hepatic flexure x2  . SURGERY OF LIP  06/2019   lip revision  . TRANSURETHRAL RESECTION OF PROSTATE N/A 03/01/2019   Procedure: TRANSURETHRAL RESECTION OF THE PROSTATE (TURP)WITH CYSTOSCOPY;  Surgeon: Raynelle Bring, MD;  Location: [facility];  Service: Urology;  Laterality: N/A;    There were no vitals filed for this visit.   Subjective Assessment - 12/15/20 1417    Subjective "I don't really have too much trouble swallowing."  Special Tests MBSS    Currently in Pain? No/denies             General - 12/15/20 1429      General Information   Date of Onset 12/11/20    HPI Colin Wagner is a 70 year old male who was referred by Dr. 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 He had an MBSS with Thomasene Mohair at [facility] 03/15/2019, outpatient dysphagia therapy 03/2019, last ENT visit with Dr. Vertell Limber 08/07/2019, and PEG removal 02/08/2020. Pt had a barium swallow study on 12/05/20 with early termination of the study due to gross, silent aspiration of contrast.    Type of Study MBS-Modified Barium Swallow Study    Previous Swallow  Assessment 03/15/2019 MBSS at [facility]    Diet Prior to this Study Regular;Thin liquids   Pt avoids certain solids (hamburger, etc)   Temperature Spikes Noted No    Respiratory Status Room air    History of Recent Intubation No    Behavior/Cognition Alert;Cooperative;Pleasant mood    Oral Cavity Assessment Within Functional Limits;Edema    Oral Care Completed by SLP No    Oral Cavity - Dentition Adequate natural dentition;Missing dentition    Vision Functional for self feeding    Self-Feeding Abilities Able to feed self    Patient Positioning Upright in chair    Baseline Vocal Quality Normal    Volitional Cough Strong    Volitional Swallow Able to elicit    Anatomy Within functional limits    Pharyngeal Secretions Not observed secondary MBS              Oral Preparation/Oral Phase - 12/15/20 1439      Oral Preparation/Oral Phase   Oral Phase Impaired      Oral - Thin   Oral - Thin Teaspoon Decreased velo-pharyngeal closure   premature spillage   Oral - Thin Cup Decreased velo-pharyngeal closure    Oral - Thin Straw Decreased velo-pharyngeal closure      Oral - Solids   Oral - Puree Decreased velo-pharyngeal closure;Weak ligual manipulation    Oral - Regular Decreased velo-pharyngeal closure;Weak ligual manipulation;Decreased bolus cohesion;Delayed A-P transit;Left anterior bolus loss    Oral - Pill Decreased velo-pharyngeal closure      Electrical stimulation - Oral Phase   Was Electrical Stimulation Used No            Pharyngeal Phase - 12/15/20 1441      Pharyngeal Phase   Pharyngeal Phase Impaired      Pharyngeal - Thin   Pharyngeal- Thin Teaspoon Swallow initiation at pyriform sinus;Reduced pharyngeal peristalsis;Reduced epiglottic inversion;Reduced anterior laryngeal mobility;Reduced laryngeal elevation;Reduced airway/laryngeal closure;Reduced tongue base retraction;Penetration/Aspiration before swallow;Pharyngeal residue - valleculae;Pharyngeal residue - pyriform     Pharyngeal Material enters airway, remains ABOVE vocal cords then ejected out;Material does not enter airway    Pharyngeal- Thin Cup Swallow initiation at pyriform sinus;Reduced pharyngeal peristalsis;Reduced epiglottic inversion;Reduced anterior laryngeal mobility;Reduced laryngeal elevation;Reduced airway/laryngeal closure;Reduced tongue base retraction;Penetration/Aspiration before swallow;Pharyngeal residue - valleculae;Pharyngeal residue - pyriform    Pharyngeal Material does not enter airway;Material enters airway, remains ABOVE vocal cords then ejected out    Pharyngeal- Thin Straw Swallow initiation at pyriform sinus;Reduced pharyngeal peristalsis;Reduced epiglottic inversion;Reduced anterior laryngeal mobility;Reduced laryngeal elevation;Reduced airway/laryngeal closure;Reduced tongue base retraction;Penetration/Aspiration before swallow;Pharyngeal residue - valleculae;Pharyngeal residue - pyriform    Pharyngeal Material does  not enter airway;Material enters airway, remains ABOVE vocal cords then ejected out      Pharyngeal - Solids   Pharyngeal- Puree Swallow initiation at vallecula;Reduced pharyngeal peristalsis;Reduced epiglottic inversion;Reduced anterior laryngeal mobility;Reduced laryngeal elevation;Reduced tongue base retraction;Pharyngeal residue - valleculae    Pharyngeal- Regular Delayed swallow initiation-vallecula;Reduced epiglottic inversion;Reduced pharyngeal peristalsis;Reduced anterior laryngeal mobility;Reduced laryngeal elevation;Reduced tongue base retraction;Pharyngeal residue - valleculae    Pharyngeal- Pill Reduced epiglottic inversion;Pharyngeal residue - valleculae;Reduced anterior laryngeal mobility;Reduced laryngeal elevation;Reduced tongue base retraction;Reduced pharyngeal peristalsis      Pharyngeal Phase - Comment   Pharyngeal Comment premature spillage, reduced velopharyngeal closure, decreased epiglottic deflection, decreased hyolaryngeal excursion, decreased  tongue base retraction, and reduced UES relaxation      Electrical Stimulation - Pharyngeal Phase   Was Electrical Stimulation Used No            Cricopharyngeal Phase - 12/15/20 1447      Cervical Esophageal Phase   Cervical Esophageal Phase Impaired      Cervical Esophageal Phase - Thin   Thin Cup Reduced cricopharyngeal relaxation      Cervical Esophageal Phase - Comment   Other Esophageal Phase Observations Appears WFL during sweep                SLP Short Term Goals - 12/15/20 1455      SLP SHORT TERM GOAL #1   Title Pt will complete pharyngeal strengthening exercises as assigned with min cues from SLP    Baseline He was instructed on excercises in 2020, but is no longer completing    Time 6    Period Weeks    Status New    Target Date 02/05/21            SLP Long Term Goals - 12/15/20 1456      SLP LONG TERM GOAL #1   Title Same as short          MBSS completed 03/15/19 at [facility]: Extensive postsurgical changes after neck dissection and segmental left mandibulectomy with screw and plate reconstruction. Multilevel degenerative changes noted in the cervical spine.  <<Patient presents with moderate-severe oral and moderate pharyngeal dysphagia secondary to extensive head and neck cancer history. Oral stage is marked by incompetent labial seal with anterior loss of liquids; lingual weakness results in prolonged and labored A/P transit. Mastication is also limited due to trismus and jaw reconstruction. Pharyngeal stage is characterized by reduced velopharyngeal seal, minimal hyolaryngeal excursion, decreased bolus propulsion, incomplete airway closure, and decreased distention of UES. Deficits result in nasal reflux/loss of intrabolus pressure, penetration across consistencies (penetration of puree and solid mixed with thin wash), and moderate pharyngeal residue. At this time recommend that patient increase intake of puree /select soft solids with thin liquids and  adherence to aspiration precautions below. Recommend patient take pills via G tube to reduce choking hazard. He will need to continue to obtain the majority of nutrition and hydration via his G tube as well. Patient was educated on risks and adverse outcomes associated with dysphagia and aspiration, and rationale for adherence to management recommendations. He could benefit from intensive outpatient swallowing therapy to address exercises and facilitate return to increased oral intake with possible goal of G tube removal. Patient elects to follow closer to home. Advised him to call his preferred facility to obtain the fax number for therapy department, and then contact Dr. Milana Na office to request referral. Patient could also potentially benefit from cervical esophageal dilation. However, we discussed that prognosis for  significant improvement with this would be guarded given the complex nature of patient's dysphagia and likely multifactorial causes of his UES dysfunction. Patient and caregiver verbalized understanding of all exam results and recommendations. Will follow up at the discretion of managing physician and treating clinician.Diet: Mechanical Soft and puree, thin liquids; continue to supplement heavily with tube feedings, Aspiration Precautions: sit upright during all PO (as close to 90 degrees as possible), cough/clear throat intermittently during meal, alternate liquids and solids/puree>>  Plan - 12/15/20 1452    Clinical Impression Statement Pt presents with moderate oral phase dysphagia and moderate pharyngeal phase dysphagia in setting of left partial mandibulectomy and post radiation changes. This assessment appears quite similar to the MBSS completed at [facility] in July 2020 with the exception of slight improvement to the oral stage of swallowing likely due to the surgery he had in November 2020 to achieve improved lip closure. Oral stage is marked by lingual weakness with resulting prolonged  anterior posterior transit of solids and also premature spillage with liquids when presented via tsp and straw. Pharyngeal stage is characterized by premature spillage, reduced velopharyngeal closure, decreased epiglottic deflection, decreased hyolaryngeal excursion, decreased tongue base retraction, and reduced UES relaxation resulting in moderate/severe vallecular residue and penetration of thins (no aspiration). Pharyngeal peristalsis is poorly coordinated and results in retrograde movement of puree and solid textures. Puree/solid residuals are diminished, but not completely removed by liquid wash via straw sips of thins. Pt with poor sensation/awareness of the significant vallecular residue. The barium tablet became lodged in the valleculae and was eventually expectorated via digital manipulation by the Pt. Despite the severity of vallecular residuals, Pt frequently stated that he has not had any difficulty swallowing solids at home and has not noted weight loss. He admits to avoiding certain meats/textures and takes his medications in puree. Pt had his PEG removed last summer and reportedly has been tolerating self regulated textures and thin liquids without significant weight loss or developing PNA. Recommend that Pt attend dysphagia therapy again for 6 sessions to implement HEP again (was given exercises in 2020). Pt may wish to follow up with ENT to see if UES dilation may be of benefit to him, although his dysphagia is multifactorial in nature.       Speech Therapy Frequency 1x /week    Duration --   6 weeks   Treatment/Interventions Aspiration precaution training;SLP instruction and feedback;Pharyngeal strengthening exercises;Compensatory strategies;Patient/family education    Potential to Achieve Goals Fair    Potential Considerations Severity of impairments    SLP Home Exercise Plan Pt will complete HEP as assigned to facilitate carryover of treatment strategies and techniques in home environment  with use of written cues.    Consulted and Agree with Plan of Care Patient           Patient will benefit from skilled therapeutic intervention in order to improve the following deficits and impairments:   Dysphagia, oropharyngeal phase     Recommendations/Treatment - 12/15/20 1448      Swallow Evaluation Recommendations   Recommended Consults Consider ENT evaluation   Consider UES dilation   SLP Diet Recommendations Dysphagia 3 (mechanical soft);Thin    Liquid Administration via Straw    Medication Administration Crushed with puree    Supervision Patient able to self feed    Compensations Multiple dry swallows after each bite/sip;Follow solids with liquid    Postural Changes Seated upright at 90 degrees;Remain upright for at least 30 minutes after feeds/meals  Prognosis - 12/15/20 1449      Prognosis   Prognosis for Safe Diet Advancement Guarded    Barriers to Reach Goals Severity of deficits;Time post onset    Barriers/Prognosis Comment post radiation changes      Individuals Consulted   Consulted and Agree with Results and Recommendations Patient    Report Sent to  Referring physician           Problem List Patient Active Problem List   Diagnosis Date Noted  . GERD (gastroesophageal reflux disease)   . IDA (iron deficiency anemia)   . GI bleed 12/04/2019  . Anemia   . Rectal bleed 11/05/2019  . Acute respiratory failure with hypoxia ([place]) 07/29/2019  . Pneumonia due to COVID-19 virus 07/28/2019  . AKI (acute kidney injury) ([place]) 07/27/2019  . Hyperkalemia 07/27/2019  . COVID-19 virus infection 07/27/2019  . Urinary retention due to benign prostatic hyperplasia 03/01/2019  . Attention to G-tube ([place]) 02/08/2018  . UTI (urinary tract infection) 04/20/2017  . UTI due to Klebsiella species 04/20/2017  . Essential hypertension 04/20/2017  . Chronic pancreatitis ([place]) 04/20/2017  . Squamous cell carcinoma of mandible ([place]) 04/20/2017  .  Malnutrition of moderate degree 04/20/2017  . Complicated UTI (urinary tract infection)   . Carcinoma of buccal mucosa ([place]) 01/05/2017  . Rectal bleeding 09/28/2016  . Hyponatremia 04/28/2015  . Abdominal pain 04/27/2015  . Acute pancreatitis 04/27/2015  . Chronic alcoholic pancreatitis ([place]) 04/27/2015  . ETOH abuse 04/27/2015  . DM type 2 (diabetes mellitus, type 2) ([place]) 04/27/2015  . Hypertension, uncontrolled 04/27/2015  . Hyperlipidemia 04/27/2015  . Hypertensive urgency 04/27/2015  . Alcohol abuse    Thank you,  Genene Churn, [place]  [facility] 12/15/2020, 2:56 PM  [place], [place], [address] Phone: [phone]   Fax:  [phone]  Name: KIONDRE GRENZ MRN: [id number] Date of Birth: 02/07/1951

## 2020-12-19 ENCOUNTER — Other Ambulatory Visit: Payer: Self-pay

## 2020-12-19 DIAGNOSIS — Z1329 Encounter for screening for other suspected endocrine disorder: Secondary | ICD-10-CM

## 2020-12-24 ENCOUNTER — Ambulatory Visit (HOSPITAL_COMMUNITY): Payer: Medicare Other | Attending: Internal Medicine | Admitting: Speech Pathology

## 2020-12-24 ENCOUNTER — Other Ambulatory Visit: Payer: Self-pay

## 2020-12-24 ENCOUNTER — Encounter (HOSPITAL_COMMUNITY): Payer: Self-pay | Admitting: Speech Pathology

## 2020-12-24 DIAGNOSIS — R1312 Dysphagia, oropharyngeal phase: Secondary | ICD-10-CM | POA: Diagnosis present

## 2020-12-24 NOTE — Therapy (Signed)
[place], [place], [address] Phone: [phone]   Fax:  [phone]  Speech Language Pathology Treatment  Patient Details  Name: Colin Wagner MRN: [id number] Date of Birth: 22-Apr-1951 No data recorded  Encounter Date: 12/24/2020   End of Session - 12/24/20 1015    Visit Number 2    Number of Visits 6    Date for SLP Re-Evaluation 02/05/21    Authorization Type Medicare    SLP Start Time 0905    SLP Stop Time  0950    SLP Time Calculation (min) 45 min    Activity Tolerance Patient tolerated treatment well           Past Medical History:  Diagnosis Date  . Alcohol abuse   . Alcoholic pancreatitis 4196   admission  . Chronic back pain   . Chronic pancreatitis ([place])    based on ct findings 2016  . COPD (chronic obstructive pulmonary disease) ([place])   . Diabetes mellitus    type 2  . Diverticulosis   . Gastritis   . GERD (gastroesophageal reflux disease)   . Headache   . History of radiation therapy 05/12/17- 06/22/17   Left cheek and bilateral neck/ 60 Gy in 30 fractions to gross disease  . Hyperlipidemia   . Hypertension   . 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 Peptic ulcer disease 1999   Per medical reports, no H pylori  . Pneumonia   . Renal cancer, left ([place]) 2012   he tells me that he has been released, ?and that he is free of cancer, and never had it to begin with.   . Right shoulder pain   . Testicular hypofunction     Past Surgical History:  Procedure Laterality Date  . CAROTID STENT  06/2018  . COLONOSCOPY  2003   Dr. [name], polyps  . COLONOSCOPY  2005   Dr. [name], multiple diverticula  . COLONOSCOPY  2008   Dr. [name], diverticulosis  . COLONOSCOPY WITH PROPOFOL N/A 10/21/2016   Dr. [name]: Diverticulosis, two 5-7 mm polyps removed. path-tubular adenomas.  Next colonoscopy in 5 years.  . ESOPHAGOGASTRODUODENOSCOPY     Multiple EGDs. 1999 EGD  showed gastric ulcers, no H pylori and benign biopsies performed by Dr. [name]. 2001 gastric ulcer healed. Last EGD 2005 had gastritis.  . IR GASTROSTOMY TUBE REMOVAL  02/08/2020  . IR REPLACE G-TUBE SIMPLE WO FLUORO  03/20/2019  . IR REPLACE G-TUBE SIMPLE WO FLUORO  09/21/2019  . IR REPLC GASTRO/COLONIC TUBE PERCUT W/FLUORO  05/16/2018  . Left partial mandibulectomy, Scapular free flap reconstruction, selective neck dissection, tracheotomy, and resection of intraoral palate cancer. Left 03/01/2017   [facility]  . LUNG BIOPSY    . MOUTH SURGERY    . PARTIAL NEPHRECTOMY Left 2012  . POLYPECTOMY  10/21/2016   Procedure: POLYPECTOMY;  Surgeon: [name], MD;  [facility];  Service: Endoscopy;;  hepatic flexure x2  . SURGERY OF LIP  06/2019   lip revision  . TRANSURETHRAL RESECTION OF PROSTATE N/A 03/01/2019   Procedure: TRANSURETHRAL RESECTION OF THE PROSTATE (TURP)WITH CYSTOSCOPY;  Surgeon: Raynelle Bring, MD;  Location: [facility];  Service: Urology;  Laterality: N/A;    There were no vitals filed for this visit.   Subjective Assessment - 12/24/20 1010    Subjective "I can pretty much eat whatever I  want to eat."    Currently in Pain? No/denies             SLP Education - 12/24/20 1014    Education Details Pt provided with swallowing exercises and recommendations; Pt wishes to d/c from therapy (see below)    Person(s) Educated Patient    Methods Explanation;Handout    Comprehension Verbalized understanding            SLP Short Term Goals - 12/24/20 1026      SLP SHORT TERM GOAL #1   Title Pt will complete pharyngeal strengthening exercises as assigned with min cues from SLP    Baseline He was instructed on excercises in 2020, but is no longer completing    Time 6    Period Weeks    Status Partially Met    Target Date 02/05/21            SLP Long Term Goals - 12/15/20 1456      SLP LONG TERM GOAL #1   Title Same as short             Plan - 12/24/20 1016    Clinical Impression Statement Pt presented for therapy this date, following MBSS completed last week. Pt tells SLP that he is able to eat and drink by self selecting appropriate items (avoids certain meats etc) and feels satisfied with this. SLP reviewed imaging from a "normal swallow" and his imaging from last week, to show the significant vallecular stasis of puree and soft solids. Pt with inefficient/impaired pharyngeal squeeze to transfer puree and solids, however he reports that he ate an entire breakfast (pancakes, eggs) this AM without expectoration and feels that "it went down fine". Suspect that previous bolus presentations and alternating with liquids helps to eventually clear the pharyngeal residue. Pt indicated that he has the list of swallowing exercises given to him previously, but has not been completing. SLP explained rationale for completion and again reviewed with Pt and provided in written format. SLP encouraged Pt to focus on the following: effortful swallow, lingual press and stretch, neck stretches (he still does these per his report), and chin tuck against resistance with swallow. Pt has an appointment with ENT at [facility] at the end of May and will ask about possible UES dilation. He does not wish to continue dysphagia therapy at this time due to feeling that his swallow is at baseline. He was encouraged to continue with the swallowing exercises provided again this date. SLP will sign off at this time.   Speech Therapy Frequency 1x /week    Duration --   6 weeks   Treatment/Interventions Aspiration precaution training;SLP instruction and feedback;Pharyngeal strengthening exercises;Compensatory strategies;Patient/family education    Potential to Achieve Goals Fair    Potential Considerations Severity of impairments    SLP Home Exercise Plan Pt will complete HEP as assigned to facilitate carryover of treatment strategies and techniques in home environment  with use of written cues.    Consulted and Agree with Plan of Care Patient           Patient will benefit from skilled therapeutic intervention in order to improve the following deficits and impairments:   Dysphagia, oropharyngeal phase    Problem List Patient Active Problem List   Diagnosis Date Noted  . GERD (gastroesophageal reflux disease)   . IDA (iron deficiency anemia)   . GI bleed 12/04/2019  . Anemia   . Rectal bleed 11/05/2019  . Acute respiratory failure with hypoxia (  [place]) 07/29/2019  . Pneumonia due to COVID-19 virus 07/28/2019  . AKI (acute kidney injury) ([place]) 07/27/2019  . Hyperkalemia 07/27/2019  . COVID-19 virus infection 07/27/2019  . Urinary retention due to benign prostatic hyperplasia 03/01/2019  . Attention to G-tube ([place]) 02/08/2018  . UTI (urinary tract infection) 04/20/2017  . UTI due to Klebsiella species 04/20/2017  . Essential hypertension 04/20/2017  . Chronic pancreatitis ([place]) 04/20/2017  . Squamous cell carcinoma of mandible ([place]) 04/20/2017  . Malnutrition of moderate degree 04/20/2017  . Complicated UTI (urinary tract infection)   . Carcinoma of buccal mucosa ([place]) 01/05/2017  . Rectal bleeding 09/28/2016  . Hyponatremia 04/28/2015  . Abdominal pain 04/27/2015  . Acute pancreatitis 04/27/2015  . 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 ETOH abuse 04/27/2015  . DM type 2 (diabetes mellitus, type 2) ([place]) 04/27/2015  . Hypertension, uncontrolled 04/27/2015  . Hyperlipidemia 04/27/2015  . Hypertensive urgency 04/27/2015  . Alcohol abuse     SPEECH THERAPY DISCHARGE SUMMARY  Visits from Start of Care: 2  Current functional level related to goals / functional outcomes: Pt only attended 1 treatment session, goals partly met   Remaining deficits: Pharyngeal dysphagia   Education / Equipment: Completed, HEP provided  Plan: Patient agrees to discharge.  Patient goals were partially met. Patient is being discharged due to  being pleased with the current functional level.  ?????         Thank you,  Genene Churn, [place]  [facility] 12/24/2020, 10:27 AM  [place] [address] [place], [place], [address] Phone: [phone]   Fax:  [phone]   Name: Colin Wagner MRN: [id number] Date of Birth: 03/16/1951

## 2020-12-31 ENCOUNTER — Ambulatory Visit (HOSPITAL_COMMUNITY): Payer: Medicare Other | Admitting: Speech Pathology

## 2021-01-07 ENCOUNTER — Ambulatory Visit (HOSPITAL_COMMUNITY): Payer: Medicare Other | Admitting: Speech Pathology

## 2021-01-14 ENCOUNTER — Ambulatory Visit (HOSPITAL_COMMUNITY): Payer: Medicare Other | Admitting: Speech Pathology

## 2021-02-17 ENCOUNTER — Other Ambulatory Visit: Payer: Self-pay

## 2021-02-17 ENCOUNTER — Encounter: Payer: Self-pay | Admitting: Internal Medicine

## 2021-02-17 ENCOUNTER — Ambulatory Visit (INDEPENDENT_AMBULATORY_CARE_PROVIDER_SITE_OTHER): Payer: Medicare Other | Admitting: Internal Medicine

## 2021-02-17 VITALS — BP 136/78 | HR 87 | Temp 96.6°F | Ht 71.0 in | Wt 183.2 lb

## 2021-02-17 DIAGNOSIS — K219 Gastro-esophageal reflux disease without esophagitis: Secondary | ICD-10-CM | POA: Diagnosis not present

## 2021-02-17 DIAGNOSIS — C060 Malignant neoplasm of cheek mucosa: Secondary | ICD-10-CM

## 2021-02-17 DIAGNOSIS — R1310 Dysphagia, unspecified: Secondary | ICD-10-CM | POA: Diagnosis not present

## 2021-02-17 NOTE — Progress Notes (Signed)
Primary Care Physician:  [name], MD Primary Gastroenterologist:  Dr. [name]  Pre-Procedure History & Physical: HPI:  Colin Wagner is a 70 y.o. male here for follow-up of vague oropharyngeal dysphagia in the setting of prior history head neck cancer radiation therapy.  Prior history of alcoholic pancreatitis, NSAID induced peptic ulcer disease history colonic adenoma.  GERD well-controlled on Protonix 40 mg daily.  He has gained 2 pounds.  He went through speech therapy.  Attended some of the sessions.  Feel like he gained maximal benefit.  He is doing fairly well with dysphagia.  Has some trouble chewing stringy meats. Went to see ENT at [facility] last month.  He was told he was doing well.  No further intervention at this time. Due for surveillance colonoscopy 2023. 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    Right shoulder pain    Testicular hypofunction     Past Surgical History:  Procedure Laterality Date   CAROTID STENT  06/2018   COLONOSCOPY  2003   Dr. [name], polyps   COLONOSCOPY  2005   Dr. [name], multiple diverticula   COLONOSCOPY  2008   Dr. [name], diverticulosis   COLONOSCOPY WITH PROPOFOL N/A 10/21/2016   Dr. [name]:  Diverticulosis, two 5-7 mm polyps removed. path-tubular adenomas.  Next colonoscopy in 5 years.   ESOPHAGOGASTRODUODENOSCOPY     Multiple EGDs. 1999 EGD showed gastric ulcers, no H pylori and benign biopsies performed by Dr. [name]. 2001 gastric ulcer healed. Last EGD 2005 had gastritis.   IR GASTROSTOMY TUBE REMOVAL  02/08/2020   IR REPLACE G-TUBE SIMPLE WO FLUORO  03/20/2019   IR REPLACE G-TUBE SIMPLE WO FLUORO  09/21/2019   IR REPLC GASTRO/COLONIC TUBE PERCUT W/FLUORO  05/16/2018   Left partial mandibulectomy, Scapular free flap reconstruction, selective neck dissection, tracheotomy, and resection of intraoral palate cancer. Left 03/01/2017   [facility]   LUNG BIOPSY     MOUTH SURGERY     PARTIAL NEPHRECTOMY Left 2012   POLYPECTOMY  10/21/2016   Procedure: POLYPECTOMY;  Surgeon: [name], MD;  [facility];  Service: Endoscopy;;  hepatic flexure x2   SURGERY OF LIP  06/2019   lip revision   TRANSURETHRAL RESECTION OF PROSTATE N/A 03/01/2019   Procedure: TRANSURETHRAL RESECTION OF THE PROSTATE (TURP)WITH CYSTOSCOPY;  Surgeon: Raynelle Bring, MD;  [facility];  Service: Urology;  Laterality: N/A;    Prior to Admission medications   Medication Sig Start Date End Date Taking? Authorizing  Provider  amLODipine (NORVASC) 5 MG tablet Take 1 tablet by mouth daily. 12/02/20  Yes [provider]  aspirin EC 81 MG tablet Take 81 mg by mouth daily.   Yes [provider]  atorvastatin (LIPITOR) 40 MG tablet Take 40 mg by mouth daily at 6 PM.  02/14/17  Yes [provider]  cetirizine (ZYRTEC) 10 MG tablet Take 10 mg by mouth daily.   Yes [provider]  clopidogrel (PLAVIX) 75 MG tablet Take 75 mg by mouth daily. 05/27/19  Yes [provider]  cyclobenzaprine (FLEXERIL) 10 MG tablet Take 10 mg by mouth 4 (four) times daily as needed.  09/06/18  Yes [provider]  fluticasone (FLONASE) 50 MCG/ACT nasal  spray Place 2 sprays into both nostrils daily.  08/04/18  Yes [provider]  glipiZIDE (GLUCOTROL XL) 2.5 MG 24 hr tablet Take 2.5 mg by mouth every morning. 08/11/18  Yes [provider]  hydrochlorothiazide (HYDRODIURIL) 12.5 MG tablet Take 12.5 mg by mouth daily. 04/24/19  Yes [provider]  HYDROcodone-acetaminophen (NORCO) 7.5-325 MG tablet Take 1 tablet by mouth every 6 (six) hours as needed (pain). Patient taking differently: Take 1 tablet by mouth every 6 (six) hours as needed for moderate pain. 01/12/17  Yes [name], MD  LEVEMIR FLEXTOUCH 100 UNIT/ML Pen Inject 40 Units into the skin at bedtime. X 6 more nights, then return back to 22 units at bedtime Patient taking differently: Inject 22-24 Units into the skin at bedtime. 07/31/19  Yes Tat, Shanon Brow, MD  lidocaine (XYLOCAINE) 2 % solution Patient: Mix 1part 2% viscous lidocaine, 1part H20. Swish &/or swallow 41mL of diluted mixture, 56min before meals and at bedtime, up to QID Patient taking differently: Use as directed 5 mLs in the mouth or throat 3 (three) times daily as needed for mouth pain. Patient: Mix 1part 2% viscous lidocaine, 1part H20. Swish &/or swallow. 04/14/18  Yes [name], MD  lisinopril (ZESTRIL) 5 MG tablet Take 5 mg by mouth daily. 11/16/20  Yes [provider]  LORazepam (ATIVAN) 1 MG tablet Take 1 mg by mouth 4 (four) times daily as needed for anxiety.  03/31/15  Yes [provider]  pantoprazole (PROTONIX) 40 MG tablet Take 40 mg by mouth daily.    Yes [provider]  senna (SENOKOT) 8.6 MG TABS tablet Take 1 tablet (8.6 mg total) by mouth at bedtime as needed for mild constipation. May be needed as pain medication can constipate. 01/12/17  Yes [name], MD  sildenafil (REVATIO) 20 MG tablet Take 20 mg by mouth as needed.   Yes [provider]  Testosterone Cypionate 200 MG/ML SOLN Inject as directed every 14 (fourteen) days.   Yes [provider]  traZODone (DESYREL) 50 MG tablet Take 100 mg by mouth at bedtime.    Yes [provider]  zinc gluconate 50 MG tablet Take 50 mg by mouth daily.   Yes [provider]    Allergies as of 02/17/2021   (No Known Allergies)    Family History  Problem Relation Age of Onset   Hypertension Mother    Colon cancer Neg Hx     Social History   Socioeconomic History   Marital status: Divorced    Spouse name: Not on file   Number of children: 0   Years of education: Not on file   Highest education level: Not on file  Occupational History   Not on file  Tobacco Use  Smoking status: Former    Packs/day: 0.50    Years: 35.00    Pack years: 17.50    Types: Cigarettes   Smokeless tobacco: Never   Tobacco comments:    quit 02-2017  Vaping Use   Vaping Use: Never used  Substance and Sexual Activity   Alcohol use: No    Comment: quit 2000 but relapse in 2016. no etoh since hospitalized 2016.    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   Patient with a history of smoking half pack a day for 35+ years. Patient currently only smoking a few cigarettes per day.   The patient has history of alcohol abuse. Patient quit in 2000 and then relapsed in 2016. Patient has not had any alcohol since 2016.   Social Determinants of Health   Financial Resource Strain: Not on file  Food Insecurity: Not on file  Transportation Needs: Not on file  Physical Activity: Not on file  Stress: Not on file  Social Connections: Not on file  Intimate Partner Violence: Not on file    Review of Systems: See HPI, otherwise negative ROS  Physical Exam: There were no vitals taken for this visit. General:   Alert,  pleasant and cooperative in NAD.  Speaks in a raspy voice. Neck:  Supple; no masses or thyromegaly. No significant cervical  adenopathy. Lungs:  Clear throughout to auscultation.   No wheezes, crackles, or rhonchi. No acute distress. Heart:  Regular rate and rhythm; no murmurs, clicks, rubs,  or gallops. Abdomen: Non-distended, normal bowel sounds.  Soft and nontender without appreciable mass or hepatosplenomegaly.  Pulses:  Normal pulses noted. Extremities:  Without clubbing or edema.  Impression/Plan: 70 year old gentleman history of a head neck cancer status post XRT, alcoholic pancreatitis, NSAID gastropathy/peptic ulcer disease colonic adenoma.  Clinically, doing well;  GERD controlled on Protonix.  Oropharyngeal dysfunction does persist but he is getting along fairly well.  Needs to be mindful of various consistencies of food when he eats.  Recommendations:  Continue to watch her diet.  Swallowing precautions recommended and discussed: Take 30 to 45 minutes to eat.  Eat slowly.  Particularly when eating meats, chop up / cut up meat into fine pieces -  take small boluses at a time.  Have adequate fluids on hand to assist with swallowing.  Continue pantoprazole 40 mg daily  Office visit here in 1 year to set up a surveillance colonoscopy.  If any interim problems, he is to let us know.   Notice: This dictation was prepared with Dragon dictation along with smaller phrase technology. Any transcriptional errors that result from this process are unintentional and may not be corrected upon review.

## 2021-02-17 NOTE — Patient Instructions (Signed)
Continue to watch her diet.  Take 30 to 45 minutes to eat.  Eat slowly.  Particularly when eating meats, chop up / cut up meat into fine pieces -  take small boluses at a time.  Have adequate fluids on hand to assist with swallowing.  Continue pantoprazole 40 mg daily  Office visit here in 1 year to set up a surveillance colonoscopy.  If any interim problems, he is to let us know.

## 2021-06-09 ENCOUNTER — Other Ambulatory Visit (HOSPITAL_COMMUNITY): Payer: Self-pay | Admitting: Nephrology

## 2021-06-09 ENCOUNTER — Other Ambulatory Visit: Payer: Self-pay | Admitting: Nephrology

## 2021-06-09 DIAGNOSIS — E1129 Type 2 diabetes mellitus with other diabetic kidney complication: Secondary | ICD-10-CM

## 2021-06-09 DIAGNOSIS — E1122 Type 2 diabetes mellitus with diabetic chronic kidney disease: Secondary | ICD-10-CM

## 2021-06-09 DIAGNOSIS — I129 Hypertensive chronic kidney disease with stage 1 through stage 4 chronic kidney disease, or unspecified chronic kidney disease: Secondary | ICD-10-CM

## 2021-06-22 ENCOUNTER — Ambulatory Visit (HOSPITAL_COMMUNITY)
Admission: RE | Admit: 2021-06-22 | Discharge: 2021-06-22 | Disposition: A | Discharge status: Home / Self Care | Payer: Medicare Other | Source: Ambulatory Visit | Attending: Nephrology | Admitting: Nephrology

## 2021-06-22 ENCOUNTER — Other Ambulatory Visit: Payer: Self-pay

## 2021-06-22 DIAGNOSIS — I129 Hypertensive chronic kidney disease with stage 1 through stage 4 chronic kidney disease, or unspecified chronic kidney disease: Secondary | ICD-10-CM | POA: Diagnosis present

## 2021-06-22 DIAGNOSIS — R809 Proteinuria, unspecified: Secondary | ICD-10-CM | POA: Insufficient documentation

## 2021-06-22 DIAGNOSIS — E1122 Type 2 diabetes mellitus with diabetic chronic kidney disease: Secondary | ICD-10-CM | POA: Insufficient documentation

## 2021-06-22 DIAGNOSIS — E1129 Type 2 diabetes mellitus with other diabetic kidney complication: Secondary | ICD-10-CM | POA: Diagnosis present

## 2021-10-08 ENCOUNTER — Other Ambulatory Visit: Payer: Self-pay

## 2021-10-08 ENCOUNTER — Encounter: Payer: Self-pay | Admitting: Urology

## 2021-10-08 ENCOUNTER — Ambulatory Visit (INDEPENDENT_AMBULATORY_CARE_PROVIDER_SITE_OTHER): Payer: Medicare Other | Admitting: Urology

## 2021-10-08 VITALS — BP 149/76 | HR 98 | Wt 183.0 lb

## 2021-10-08 DIAGNOSIS — B3742 Candidal balanitis: Secondary | ICD-10-CM | POA: Diagnosis not present

## 2021-10-08 DIAGNOSIS — E291 Testicular hypofunction: Secondary | ICD-10-CM

## 2021-10-08 DIAGNOSIS — N529 Male erectile dysfunction, unspecified: Secondary | ICD-10-CM

## 2021-10-08 MED ORDER — TADALAFIL 20 MG PO TABS: 20.0000 mg | ORAL_TABLET | Freq: Every day | ORAL | 11 refills | Status: DC | PRN

## 2021-10-08 NOTE — Progress Notes (Signed)
Assessment: 1. 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 Candidal balanitis     Plan: Today I had a long discussion with the patient spending a total of 15 minutes discussing ED.  I discussed the pathophysiology, etiology, and natural history of ED as well as management options using a goal-oriented approach.  We discussed the following options: Medical therapy, vacuum erection device, penile injections, intraurethral suppository therapy (MUSE), and penile prosthesis. Patient educational materials concerning these options were given to the patient.   Request all recent testosterone, CBC, and PSA results from [facility] family care Trial of tadalafil 20 mg as needed in place of sildenafil. Continue topical antifungal for management of balanitis. Return to office in 4-6 weeks  Chief Complaint:  Chief Complaint  Patient presents with   Erectile Dysfunction   History of Present Illness:  Colin Wagner is a 71 y.o. year old male who is seen in consultation from Carlis Abbott, NP for evaluation of erectile dysfunction.  He has had symptoms for several months with gradual worsening.  He is able to achieve an erection but has rapid detumescence.  No pain or curvature.  He is able to use his erection for intercourse and is able to ejaculate.  He has been using sildenafil 100 mg without significant improvement in his symptoms.   He is on testosterone injections 200 mg every 2 weeks.  He has been doing this for several years.  No recent lab results available. He reports urinary frequency, nocturia 1-2 times, and intermittent stream.  No dysuria or gross hematuria. IPSS = 7 today. He has a history of TURP by Dr. Alinda Money in July 2020. He also has had a partial nephrectomy for renal cell carcinoma in 2012.  He does have a history of Candida balanitis.  He is currently having symptoms.  He is using topical powder.   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    Right shoulder pain    Testicular hypofunction     Past Surgical History:  Past Surgical History:  Procedure Laterality Date   CAROTID STENT  06/2018   COLONOSCOPY  2003   Dr. [name], polyps   COLONOSCOPY  2005   Dr. [name], multiple diverticula   COLONOSCOPY  2008   Dr. [name], diverticulosis   COLONOSCOPY WITH PROPOFOL N/A 10/21/2016   Dr. [name]: Diverticulosis, two 5-7 mm polyps removed. path-tubular adenomas.  Next colonoscopy in 5 years.   ESOPHAGOGASTRODUODENOSCOPY     Multiple EGDs. 1999 EGD  showed gastric ulcers, no H pylori and benign biopsies performed by Dr. [name]. 2001 gastric ulcer healed. Last EGD 2005 had gastritis.   IR GASTROSTOMY TUBE REMOVAL  02/08/2020   IR REPLACE G-TUBE SIMPLE WO FLUORO  03/20/2019   IR REPLACE G-TUBE SIMPLE WO FLUORO  09/21/2019   IR REPLC GASTRO/COLONIC TUBE PERCUT W/FLUORO  05/16/2018   Left partial mandibulectomy, Scapular free flap reconstruction, selective neck dissection, tracheotomy, and resection of intraoral palate cancer. Left 03/01/2017   [facility]   LUNG BIOPSY     MOUTH SURGERY     PARTIAL NEPHRECTOMY Left 2012   POLYPECTOMY  10/21/2016   Procedure:  POLYPECTOMY;  Surgeon: [name], MD;  [facility];  Service: Endoscopy;;  hepatic flexure x2   SURGERY OF LIP  06/2019   lip revision   TRANSURETHRAL RESECTION OF PROSTATE N/A 03/01/2019   Procedure: TRANSURETHRAL RESECTION OF THE PROSTATE (TURP)WITH CYSTOSCOPY;  Surgeon: Raynelle Bring, MD;  [facility]: [facility];  Service: Urology;  Laterality: N/A;    Allergies:  No Known Allergies  Family History:  Family History  Problem Relation Age of Onset   Hypertension Mother    Colon cancer Neg Hx     Social History:  Social History   Tobacco Use   Smoking status: Former    Packs/day: 0.50    Years: 35.00    Pack years: 17.50    Types: Cigarettes   Smokeless tobacco: Never   Tobacco comments:    quit 02-2017  Vaping Use   Vaping Use: Never used  Substance Use Topics   Alcohol use: No    Comment: quit 2000 but relapse in 2016. no etoh since hospitalized 2016.    Drug use: No    Review of symptoms:  Constitutional:  Negative for unexplained weight loss, night sweats, fever, chills ENT:  Negative for nose bleeds, sinus pain, painful swallowing CV:  Negative for chest pain, shortness of breath, exercise intolerance, palpitations, loss of consciousness Resp:  Negative for cough, wheezing, shortness of breath GI:  Negative for nausea, vomiting, diarrhea, bloody stools GU:  Positives noted in HPI; otherwise negative for gross hematuria, dysuria, urinary incontinence Neuro:  Negative for seizures, poor balance, limb weakness, slurred speech Psych:  Negative for lack of energy, depression, anxiety Endocrine:  Negative for polydipsia, polyuria, symptoms of hypoglycemia (dizziness, hunger, sweating) Hematologic:  Negative for anemia, purpura, petechia, prolonged or excessive bleeding, use of anticoagulants  Allergic:  Negative for difficulty breathing or choking as a result of exposure to anything; no shellfish allergy; no allergic response (rash/itch) to materials,  foods  Physical exam: BP (!) 149/76 (BP Location: Right Arm)    Pulse 98    Wt 183 lb (83 kg)    BMI 25.52 kg/m  GENERAL APPEARANCE:  Well appearing, well developed, well nourished, NAD HEENT: Atraumatic, Normocephalic, oropharynx clear. NECK: Supple without lymphadenopathy or thyromegaly. LUNGS: Clear to auscultation bilaterally. HEART: Regular Rate and Rhythm without murmurs, gallops, or rubs. ABDOMEN: Soft, non-tender, No Masses. EXTREMITIES: Moves all extremities well.  Without clubbing, cyanosis, or edema. NEUROLOGIC:  Alert and oriented x 3, normal gait, CN II-XII grossly intact.  MENTAL STATUS:  Appropriate. BACK:  Non-tender to palpation.  No CVAT SKIN:  Warm, dry and intact.   GU: Penis: Uncircumcised; erythema of glans consistent with Candida balanitis Meatus: Normal Scrotum: normal, no masses Testis: normal without masses bilaterally Prostate: 40 g, NT, some asymmetry Rectum: Normal tone,  no masses or  tenderness   Results: None

## 2021-10-12 ENCOUNTER — Other Ambulatory Visit: Payer: Self-pay

## 2021-10-16 ENCOUNTER — Other Ambulatory Visit (HOSPITAL_COMMUNITY): Payer: Self-pay | Admitting: Nephrology

## 2021-10-16 ENCOUNTER — Other Ambulatory Visit: Payer: Self-pay | Admitting: Nephrology

## 2021-10-16 DIAGNOSIS — N170 Acute kidney failure with tubular necrosis: Secondary | ICD-10-CM

## 2021-10-16 DIAGNOSIS — N182 Chronic kidney disease, stage 2 (mild): Secondary | ICD-10-CM

## 2021-10-16 DIAGNOSIS — E1122 Type 2 diabetes mellitus with diabetic chronic kidney disease: Secondary | ICD-10-CM

## 2021-10-16 DIAGNOSIS — E611 Iron deficiency: Secondary | ICD-10-CM

## 2021-10-26 ENCOUNTER — Ambulatory Visit (HOSPITAL_COMMUNITY)
Admission: RE | Admit: 2021-10-26 | Discharge: 2021-10-26 | Disposition: A | Discharge status: Home / Self Care | Payer: Medicare Other | Source: Ambulatory Visit | Attending: Nephrology | Admitting: Nephrology

## 2021-10-26 ENCOUNTER — Other Ambulatory Visit: Payer: Self-pay

## 2021-10-26 DIAGNOSIS — N182 Chronic kidney disease, stage 2 (mild): Secondary | ICD-10-CM

## 2021-10-26 DIAGNOSIS — E1122 Type 2 diabetes mellitus with diabetic chronic kidney disease: Secondary | ICD-10-CM | POA: Diagnosis present

## 2021-10-26 DIAGNOSIS — E611 Iron deficiency: Secondary | ICD-10-CM | POA: Diagnosis present

## 2021-10-26 DIAGNOSIS — N170 Acute kidney failure with tubular necrosis: Secondary | ICD-10-CM | POA: Insufficient documentation

## 2021-10-29 ENCOUNTER — Encounter: Payer: Self-pay | Admitting: *Deleted

## 2021-11-09 ENCOUNTER — Encounter: Payer: Self-pay | Admitting: Internal Medicine

## 2021-11-19 ENCOUNTER — Encounter: Payer: Self-pay | Admitting: Urology

## 2021-11-19 ENCOUNTER — Ambulatory Visit (INDEPENDENT_AMBULATORY_CARE_PROVIDER_SITE_OTHER): Payer: Medicare Other | Admitting: Urology

## 2021-11-19 VITALS — BP 151/83 | HR 94

## 2021-11-19 DIAGNOSIS — N529 Male erectile dysfunction, unspecified: Secondary | ICD-10-CM

## 2021-11-19 DIAGNOSIS — E291 Testicular hypofunction: Secondary | ICD-10-CM | POA: Diagnosis not present

## 2021-11-19 MED ORDER — TADALAFIL 20 MG PO TABS: 20.0000 mg | ORAL_TABLET | Freq: Every day | ORAL | 11 refills | Status: DC | PRN

## 2021-11-19 NOTE — Progress Notes (Signed)
? ?Assessment: ?1. 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 ? ?Plan: ?Continue tadalafil 20 mg prn.  Rx sent. ?Return to office in 1 year ? ?Chief Complaint:  ?Chief Complaint  ?Patient presents with  ? Erectile Dysfunction  ? ?History of Present Illness: ? ?Colin Wagner is a 71 y.o. year old male who is seen for further evaluation of erectile dysfunction.  He has had symptoms for several months with gradual worsening.  He is able to achieve an erection but has rapid detumescence.  No pain or curvature.  He is able to use his erection for intercourse and is able to ejaculate.  He has been using sildenafil 100 mg without significant improvement in his symptoms.  ? ?He has a history of hypogonadism and has been on testosterone injections 200 mg every 2 weeks for several years.  No recent testosterone levels available. ? ?He reports urinary frequency, nocturia 1-2 times, and intermittent stream.  No dysuria or gross hematuria. ?IPSS = 7. ?He has a history of TURP by Dr. Alinda Money in July 2020. ?He also has had a partial nephrectomy for renal cell carcinoma in 2012. ? ?He was found to have candida balanitis. He is using topical antifungal powder. ? ?PSA results: ?6/17 2.5 ?6/18 2.3 ?10/19 1.6 ?8/22 1.34 ? ?He was given a trial of tadalafil 20 mg as needed at his visit in February 2023. ?He returns today for follow-up.  He had good results with tadalafil.  No side effects.  He would like a prescription sent to his pharmacy.  His lower urinary tract symptoms remain stable.  No dysuria or gross hematuria. ?IPSS = 7 today. ?He continues on testosterone injections 300 mg every 2 weeks. ? ? ?Portions of the above documentation were copied from a prior visit for review purposes only. ? ? ? ?Past Medical History:  ?Past Medical History:  ?Diagnosis Date  ? Alcohol abuse   ? Alcoholic pancreatitis 0354  ? admission  ? Chronic back pain   ? Chronic pancreatitis ([place])   ? based on ct findings 2016  ? COPD (chronic  obstructive pulmonary disease) ([place])   ? Diabetes mellitus   ? type 2  ? Diverticulosis   ? Gastritis   ? GERD (gastroesophageal reflux disease)   ? Headache   ? History of radiation therapy 05/12/17- 06/22/17  ? Left cheek and bilateral neck/ 60 Gy in 30 fractions to gross disease  ? Hyperlipidemia   ? Hypertension   ? 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 left jaw part of jaw bone removed  ? Peptic ulcer disease 1999  ? Per medical reports, no H pylori  ? Pneumonia   ? Renal cancer, left ([place]) 2012  ? he tells me that he has been released, ?and that he is free of cancer, and never had it to begin with.   ? Right shoulder pain   ? Testicular hypofunction   ? ? ?Past Surgical History:  ?Past Surgical History:  ?Procedure Laterality Date  ? CAROTID STENT  06/2018  ? COLONOSCOPY  2003  ? Dr. [name], polyps  ? COLONOSCOPY  2005  ? Dr. [name], multiple diverticula  ? COLONOSCOPY  2008  ? Dr. [name], diverticulosis  ? COLONOSCOPY WITH PROPOFOL N/A 10/21/2016  ? Dr. [name]: Diverticulosis, two 5-7 mm polyps removed. path-tubular adenomas.  Next colonoscopy in 5 years.  ? ESOPHAGOGASTRODUODENOSCOPY    ? Multiple EGDs. 1999 EGD showed gastric ulcers, no H pylori and  benign biopsies performed by Dr. [name]. 2001 gastric ulcer healed. Last EGD 2005 had gastritis.  ? IR GASTROSTOMY TUBE REMOVAL  02/08/2020  ? IR REPLACE G-TUBE SIMPLE WO FLUORO  03/20/2019  ? IR REPLACE G-TUBE SIMPLE WO FLUORO  09/21/2019  ? IR REPLC GASTRO/COLONIC TUBE PERCUT W/FLUORO  05/16/2018  ? Left partial mandibulectomy, Scapular free flap reconstruction, selective neck dissection, tracheotomy, and resection of intraoral palate cancer. Left 03/01/2017  ? [facility]  ? LUNG BIOPSY    ? MOUTH SURGERY    ? PARTIAL NEPHRECTOMY Left 2012  ? POLYPECTOMY  10/21/2016  ? Procedure: POLYPECTOMY;  Surgeon: [name], MD;  [facility];  Service: Endoscopy;;  hepatic flexure x2  ? SURGERY OF LIP  06/2019  ? lip revision  ?  TRANSURETHRAL RESECTION OF PROSTATE N/A 03/01/2019  ? Procedure: TRANSURETHRAL RESECTION OF THE PROSTATE (TURP)WITH CYSTOSCOPY;  Surgeon: Raynelle Bring, MD;  [facility];  Service: Urology;  Laterality: N/A;  ? ? ?Allergies:  ?No Known Allergies ? ?Family History:  ?Family History  ?Problem Relation Age of Onset  ? Hypertension Mother   ? Colon cancer Neg Hx   ? ? ?Social History:  ?Social History  ? ?Tobacco Use  ? Smoking status: Former  ?  Packs/day: 0.50  ?  Years: 35.00  ?  Pack years: 17.50  ?  Types: Cigarettes  ? Smokeless tobacco: Never  ? Tobacco comments:  ?  quit 02-2017  ?Vaping Use  ? Vaping Use: Never used  ?Substance Use Topics  ? Alcohol use: No  ?  Comment: quit 2000 but relapse in 2016. no etoh since hospitalized 2016.   ? Drug use: No  ? ? ?ROS: ?Constitutional:  Negative for fever, chills, weight loss ?CV: Negative for chest pain, previous MI, hypertension ?Respiratory:  Negative for shortness of breath, wheezing, sleep apnea, frequent cough ?GI:  Negative for nausea, vomiting, bloody stool, GERD ? ?Physical exam: ?BP (!) 151/83   Pulse 94  ?GENERAL APPEARANCE:  Well appearing, well developed, well nourished, NAD ?HEENT:  Atraumatic, normocephalic, oropharynx clear ?NECK:  Supple without lymphadenopathy or thyromegaly ?ABDOMEN:  Soft, non-tender, no masses ?EXTREMITIES:  Moves all extremities well, without clubbing, cyanosis, or edema ?NEUROLOGIC:  Alert and oriented x 3, normal gait, CN II-XII grossly intact ?MENTAL STATUS:  appropriate ?BACK:  Non-tender to palpation, No CVAT ?SKIN:  Warm, dry, and intact ? ? ?Results: ?U/A dipstick negative ?

## 2021-11-20 LAB — URINALYSIS, ROUTINE W REFLEX MICROSCOPIC
Bilirubin, UA: NEGATIVE
Ketones, UA: NEGATIVE
Leukocytes,UA: NEGATIVE
Nitrite, UA: NEGATIVE
RBC, UA: NEGATIVE
Specific Gravity, UA: 1.02 (ref 1.005–1.030)
Urobilinogen, Ur: 0.2 mg/dL (ref 0.2–1.0)
pH, UA: 6 (ref 5.0–7.5)

## 2021-11-24 ENCOUNTER — Encounter: Payer: Self-pay | Admitting: Internal Medicine

## 2021-11-24 ENCOUNTER — Other Ambulatory Visit: Payer: Self-pay

## 2021-11-24 ENCOUNTER — Ambulatory Visit (INDEPENDENT_AMBULATORY_CARE_PROVIDER_SITE_OTHER): Payer: Medicare Other | Admitting: Internal Medicine

## 2021-11-24 VITALS — BP 142/72 | HR 95 | Temp 97.7°F | Ht 71.0 in | Wt 187.2 lb

## 2021-11-24 DIAGNOSIS — Z86010 Personal history of colonic polyps: Secondary | ICD-10-CM

## 2021-11-24 DIAGNOSIS — R1310 Dysphagia, unspecified: Secondary | ICD-10-CM

## 2021-11-24 MED ORDER — PEG 3350-KCL-NA BICARB-NACL 420 G PO SOLR: 4000.0000 mL | ORAL | 0 refills | Status: DC

## 2021-11-24 NOTE — Progress Notes (Signed)
? ? ?Primary Care Physician:  [name], MD ?Primary Gastroenterologist:  Dr. [name] ? ?Pre-Procedure History & Physical: ?HPI:  Colin Wagner is a 71 y.o. male here in preparation for a surveillance colonoscopy.  History multiple colonic adenomas removed from his colon 2018.  Pancolonic diverticulosis.  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  Previously had a PEG; its been removed.  He is doing pretty well on a dysphagia 2 diet has difficulties with steak and other meats.  Sometimes difficulty with pills.  He is wondering if he could be "stretched".  He remains on aspirin and Plavix.  He has a right carotid artery stent.  Multiple comorbidities no further tobacco or alcohol use.  He denies having any reflux symptoms and is not on acid suppression therapy.  No recent EGD. ? ?Past Medical History:  ?Diagnosis Date  ? Alcohol abuse   ? Alcoholic pancreatitis 8101  ? admission  ? Chronic back pain   ? Chronic pancreatitis ([place])   ? based on ct findings 2016  ? COPD (chronic obstructive pulmonary disease) ([place])   ? Diabetes mellitus   ? type 2  ? Diverticulosis   ? Gastritis   ? GERD (gastroesophageal reflux disease)   ? Headache   ? History of radiation therapy 05/12/17- 06/22/17  ? Left cheek and bilateral neck/ 60 Gy in 30 fractions to gross disease  ? Hyperlipidemia   ? Hypertension   ? 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 left jaw part of jaw bone removed  ? Peptic ulcer disease 1999  ? Per medical reports, no H pylori  ? Pneumonia   ? Renal cancer, left ([place]) 2012  ? he tells me that he has been released, ?and that he is free of cancer, and never had it to begin with.   ? Right shoulder pain   ? Testicular hypofunction   ? ? ?Past Surgical History:  ?Procedure Laterality Date  ? CAROTID STENT  06/2018  ? COLONOSCOPY  2003  ? Dr. [name], polyps  ? COLONOSCOPY  2005  ? Dr. [name], multiple diverticula  ? COLONOSCOPY  2008  ? Dr. [name], diverticulosis  ? COLONOSCOPY WITH PROPOFOL N/A 10/21/2016   ? Dr. [name]: Diverticulosis, two 5-7 mm polyps removed. path-tubular adenomas.  Next colonoscopy in 5 years.  ? ESOPHAGOGASTRODUODENOSCOPY    ? Multiple EGDs. 1999 EGD showed gastric ulcers, no H pylori and benign biopsies performed by Dr. [name]. 2001 gastric ulcer healed. Last EGD 2005 had gastritis.  ? IR GASTROSTOMY TUBE REMOVAL  02/08/2020  ? IR REPLACE G-TUBE SIMPLE WO FLUORO  03/20/2019  ? IR REPLACE G-TUBE SIMPLE WO FLUORO  09/21/2019  ? IR REPLC GASTRO/COLONIC TUBE PERCUT W/FLUORO  05/16/2018  ? Left partial mandibulectomy, Scapular free flap reconstruction, selective neck dissection, tracheotomy, and resection of intraoral palate cancer. Left 03/01/2017  ? [facility]  ? LUNG BIOPSY    ? MOUTH SURGERY    ? PARTIAL NEPHRECTOMY Left 2012  ? POLYPECTOMY  10/21/2016  ? Procedure: POLYPECTOMY;  Surgeon: [name], MD;  [facility];  Service: Endoscopy;;  hepatic flexure x2  ? SURGERY OF LIP  06/2019  ? lip revision  ? TRANSURETHRAL RESECTION OF PROSTATE N/A 03/01/2019  ? Procedure: TRANSURETHRAL RESECTION OF THE PROSTATE (TURP)WITH CYSTOSCOPY;  Surgeon: Raynelle Bring, MD;  Location: [facility];  Service: Urology;  Laterality: N/A;  ? ? ?Prior to Admission medications   ?Medication Sig  Start Date End Date Taking? Authorizing Provider  ?amLODipine (NORVASC) 5 MG tablet Take 1 tablet by mouth daily. 12/02/20  Yes [provider]  ?aspirin EC 81 MG tablet Take 81 mg by mouth daily.   Yes [provider]  ?atorvastatin (LIPITOR) 40 MG tablet Take 40 mg by mouth daily at 6 PM.  02/14/17  Yes [provider]  ?clopidogrel (PLAVIX) 75 MG tablet Take 75 mg by mouth daily. 05/27/19  Yes [provider]  ?cyclobenzaprine (FLEXERIL) 10 MG tablet Take 10 mg by mouth 4 (four) times daily as needed.  09/06/18  Yes [provider]  ?glipiZIDE (GLUCOTROL XL) 2.5 MG 24 hr tablet Take 2.5 mg by mouth every morning. 08/11/18  Yes [provider]   ?hydrochlorothiazide (HYDRODIURIL) 12.5 MG tablet Take 12.5 mg by mouth daily. 04/24/19  Yes [provider]  ?HYDROcodone-acetaminophen (NORCO) 7.5-325 MG tablet Take 1 tablet by mouth every 6 (six) hours as needed (pain). ?Patient taking differently: Take 1 tablet by mouth every 6 (six) hours as needed for moderate pain. 01/12/17  Yes [name], MD  ?Ernest Mallick FLEXTOUCH 100 UNIT/ML Pen Inject 40 Units into the skin at bedtime. X 6 more nights, then return back to 22 units at bedtime ?Patient taking differently: Inject 22-24 Units into the skin at bedtime. 07/31/19  Yes Tat, Shanon Brow, MD  ?LORazepam (ATIVAN) 1 MG tablet Take 1 mg by mouth 4 (four) times daily as needed for anxiety.  03/31/15  Yes [provider]  ?pantoprazole (PROTONIX) 40 MG tablet Take 40 mg by mouth daily.    Yes [provider]  ?senna (SENOKOT) 8.6 MG TABS tablet Take 1 tablet (8.6 mg total) by mouth at bedtime as needed for mild constipation. May be needed as pain medication can constipate. 01/12/17  Yes [name], MD  ?tadalafil (CIALIS) 20 MG tablet Take 1 tablet (20 mg total) by mouth daily as needed for erectile dysfunction. Take 30-60 minutes prior to intercourse 11/19/21  Yes Stoneking, Reece Leader., MD  ?Testosterone Cypionate 200 MG/ML SOLN Inject as directed every 14 (fourteen) days.   Yes [provider]  ?traZODone (DESYREL) 50 MG tablet Take 100 mg by mouth at bedtime.    Yes [provider]  ?zinc gluconate 50 MG tablet Take 50 mg by mouth daily.   Yes [provider]  ? ? ?Allergies as of 11/24/2021  ? (No Known Allergies)  ? ? ?Family History  ?Problem Relation Age of Onset  ? Hypertension Mother   ? Colon cancer Neg Hx   ? ? ?Social History  ? ?Socioeconomic History  ? Marital status: Divorced  ?  Spouse name: Not on file  ? Number of children: 0  ? Years of education: Not on file  ? Highest education level: Not on file  ?Occupational History  ? Not on file  ?Tobacco Use  ?  Smoking status: Former  ?  Packs/day: 0.50  ?  Years: 35.00  ?  Pack years: 17.50  ?  Types: Cigarettes  ? Smokeless tobacco: Never  ? Tobacco comments:  ?  quit 02-2017  ?Vaping Use  ? Vaping Use: Never used  ?Substance and Sexual Activity  ? Alcohol use: No  ?  Comment: quit 2000 but relapse in 2016. no etoh since hospitalized 2016.   ? Drug use: No  ? Sexual activity: Not Currently  ?Other Topics Concern  ? Not on file  ?Social History Narrative  ? 01/06/2017  ? Patient is  retired from [employer].  ? Patient with a history of smoking half pack a day for 35+ years. Patient currently only smoking a few cigarettes per day.  ? The patient has history of alcohol abuse. Patient quit in 2000 and then relapsed in 2016. Patient has not had any alcohol since 2016.  ? ?Social Determinants of Health  ? ?Financial Resource Strain: Not on file  ?Food Insecurity: Not on file  ?Transportation Needs: Not on file  ?Physical Activity: Not on file  ?Stress: Not on file  ?Social Connections: Not on file  ?Intimate Partner Violence: Not on file  ? ? ?Review of Systems: ?See HPI, otherwise negative ROS ? ?Physical Exam: ?BP (!) 142/72 (BP Location: Right Arm, Patient Position: Sitting, Cuff Size: Normal)   Pulse 95   Temp 97.7 ?F (36.5 ?C) (Temporal)   Ht 5\' 11"  (1.803 m)   Wt 187 lb 3.2 oz (84.9 kg)   SpO2 93%   BMI 26.11 kg/m?  ?General:   Alert,  Well-developed, well-nourished, pleasant and cooperative in NAD.  Mildly distorted speech.  Jaw asymmetry on the left side. ?Mouth:  No deformity or lesions. ?Neck:  Supple; no masses or thyromegaly. No significant cervical adenopathy. ?Lungs:  Clear throughout to auscultation.   No wheezes, crackles, or rhonchi. No acute distress. ?Heart:  Regular rate and rhythm; no murmurs, clicks, rubs,  or gallops. ?Abdomen: Multiple laparoscopic port site scars.  Non-distended, normal bowel sounds.  Soft and nontender without appreciable mass or hepatosplenomegaly.  ?Pulses:   Normal pulses noted. ?Extremities:  Without clubbing or edema. ? ?Impression/Plan: Pleasant 71 year old gentleman with history head neck cancer status post XRT and radiation.  Has difficulties with solid food/pills

## 2021-11-24 NOTE — Patient Instructions (Signed)
It was good seeing you again today! ? ?As discussed, we will perform an EGD and a surveillance colonoscopy in the near future because of dysphagia and a history of colon polyps.  ASA 3. ? ?You will continue on aspirin and Plavix. ? ?You may or may not have findings that can be dealt with by stretching or dilating your esophagus. ?Being on aspirin and Plavix does increase the risk of bleeding somewhat.  A conservative approach will be taken.  If dilation is performed, it will likely not alleviate your swallowing difficulties completely. ? ?Further recommendations to follow. ? ?

## 2021-12-17 NOTE — Progress Notes (Signed)
[name] presents for follow up of radiation completed 06/22/2017 to his left cheek and bilateral neck. ? ?Pain issues, if any: None ?Using a feeding tube?: N/A ?Weight changes, if any: None ? ?Swallowing issues, if any: Yes (from most recent GI visit on 11/24/2021): "Has difficulties with solid food/pills as outlined above.  No recent imaging of his upper GI tract.  He would like to be able to swallow better.  This may or may not be feasible.  Is been a long time since he had an EGD.  It would be worth reevaluating his upper GI tract and see if he has a lesion that would be amenable to dilation.  He needs to stay on aspirin and Plavix.  Therefore, EGD any attempted dilation would follow a conservative tract. ?Separate issue; due for surveillance colonoscopy." ? ?Smoking or chewing tobacco? No ?Using fluoride trays daily? No N/A--does see a dentist every 6 months ?Last ENT visit was on: 07/24/2021 Saw Dr. Larkin Ina Hall/Dr. Harriett Rush [place]: ?"In regards to his cancer surveillance he exhibits no evidence of disease on examination today. His oral competence has improved sp wedge resection of left lower lip. 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 ?Other notable issues, if any: LT sided Jaw tightness ? ? ? ? ?

## 2021-12-18 ENCOUNTER — Encounter: Payer: Self-pay | Admitting: Radiation Oncology

## 2021-12-18 ENCOUNTER — Other Ambulatory Visit: Payer: Self-pay

## 2021-12-18 ENCOUNTER — Ambulatory Visit
Admission: RE | Admit: 2021-12-18 | Discharge: 2021-12-18 | Disposition: A | Discharge status: Home / Self Care | Payer: Medicare Other | Source: Ambulatory Visit | Attending: Radiation Oncology | Admitting: Radiation Oncology

## 2021-12-18 VITALS — BP 136/76 | HR 86 | Temp 97.9°F | Resp 20 | Ht 71.0 in | Wt 190.0 lb

## 2021-12-18 DIAGNOSIS — Z79899 Other long term (current) drug therapy: Secondary | ICD-10-CM | POA: Insufficient documentation

## 2021-12-18 DIAGNOSIS — Z923 Personal history of irradiation: Secondary | ICD-10-CM | POA: Insufficient documentation

## 2021-12-18 DIAGNOSIS — Z7982 Long term (current) use of aspirin: Secondary | ICD-10-CM | POA: Insufficient documentation

## 2021-12-18 DIAGNOSIS — Z8589 Personal history of malignant neoplasm of other organs and systems: Secondary | ICD-10-CM | POA: Insufficient documentation

## 2021-12-18 DIAGNOSIS — Z7984 Long term (current) use of oral hypoglycemic drugs: Secondary | ICD-10-CM | POA: Insufficient documentation

## 2021-12-18 DIAGNOSIS — C060 Malignant neoplasm of cheek mucosa: Secondary | ICD-10-CM

## 2021-12-18 DIAGNOSIS — C411 Malignant neoplasm of mandible: Secondary | ICD-10-CM

## 2021-12-22 ENCOUNTER — Encounter: Payer: Self-pay | Admitting: Radiation Oncology

## 2021-12-22 NOTE — Progress Notes (Signed)
?Radiation Oncology         [phone] ?________________________________ ? ?Name: Colin Wagner MRN: [id number]  ?Date: 12/18/2021  DOB: Jan 17, 1951 ? ?Follow-Up Visit Note face-to-face, outpatient ? ?CC: Colin Evens, MD  Colin Evens, MD ? ?Diagnosis and Prior Radiotherapy:     C41.1 ? ?  ICD-10-CM   ?1. 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  ?2. 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  ?  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 ?CHIEF COMPLAINT:  Here for follow-up and surveillance of head and neck cancer  ? ?Narrative:    ? Colin Wagner presents for follow up of radiation completed 06/22/2017 to his left cheek and bilateral neck. ? ?Pain issues, if any: None ?Using a feeding tube?: N/A ?Weight changes, if any: None ? ?Swallowing issues, if any: Yes (from most recent GI visit on 11/24/2021): "Has difficulties with solid food/pills as outlined above.  No recent imaging of his upper GI tract.  He would like to be able to swallow better.  This may or may not be feasible.  Is been a long time since he had an EGD.  It would be worth reevaluating his upper GI tract and see if he has a lesion that would be amenable to dilation.  He needs to stay on aspirin and Plavix.  Therefore, EGD any attempted dilation would follow a conservative tract. ?Separate issue; due for surveillance colonoscopy." ? ?Smoking or chewing tobacco? No ?Using fluoride trays daily? No N/A--does see a dentist every 6 months ?Last ENT visit was on: 07/24/2021 Saw Dr. Larkin Ina Wagner/Colin Wagner: ?"In regards to his cancer surveillance he exhibits no evidence of disease on examination today. His oral competence has improved sp wedge resection of left lower lip. 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 ?Other notable issues, if any: LT sided Jaw tightness ? ?He is here with his fianc?e today.  She remains very supportive. ? ?ALLERGIES:  has No Known Allergies. ? ?Meds: ?Current Outpatient Medications  ?Medication Sig Dispense Refill  ? amLODipine (NORVASC) 5 MG tablet Take 1 tablet by mouth daily.    ? aspirin EC 81 MG tablet Take 81 mg by mouth daily.    ? atorvastatin (LIPITOR) 40 MG tablet Take 40 mg by mouth daily at 6 PM.     ? clopidogrel (PLAVIX) 75 MG tablet Take 75 mg by mouth daily.    ? cyclobenzaprine (FLEXERIL) 10 MG tablet Take 10 mg by mouth 4 (four) times daily as needed.     ? glipiZIDE (GLUCOTROL XL) 2.5 MG 24 hr tablet Take 2.5 mg by mouth every morning.    ? hydrochlorothiazide (HYDRODIURIL) 12.5 MG tablet Take 12.5 mg by mouth daily.    ? HYDROcodone-acetaminophen (NORCO) 7.5-325 MG tablet Take 1 tablet by mouth every 6 (six) hours as needed (pain). (Patient taking differently: Take 1 tablet by mouth every 6 (six) hours as needed for moderate pain.) 60 tablet 0  ? LEVEMIR FLEXTOUCH 100 UNIT/ML Pen Inject 40 Units into the skin at bedtime. X 6 more  nights, then return back to 22 units at bedtime (Patient taking differently: Inject 22-24 Units into the skin at bedtime.) 15 mL 11  ? LORazepam (ATIVAN) 1 MG tablet Take 1 mg by mouth 4 (four) times daily as needed for anxiety.   5  ? pantoprazole (PROTONIX) 40 MG tablet Take 40 mg by mouth daily.     ? polyethylene glycol-electrolytes (TRILYTE) 420 g solution Take 4,000 mLs by mouth as directed. 4000 mL 0  ? senna (SENOKOT) 8.6 MG TABS tablet Take 1 tablet (8.6 mg total) by mouth at bedtime as needed for mild constipation. May be needed as pain medication can constipate. 120 each 1  ? tadalafil (CIALIS) 20 MG tablet Take 1 tablet (20 mg total) by mouth daily as needed for  erectile dysfunction. Take 30-60 minutes prior to intercourse 20 tablet 11  ? Testosterone Cypionate 200 MG/ML SOLN Inject as directed every 14 (fourteen) days.    ? traZODone (DESYREL) 50 MG tablet Take 100 mg by mouth at bedtime.     ? zinc gluconate 50 MG tablet Take 50 mg by mouth daily.    ? ?No current facility-administered medications for this encounter.  ?  ?Physical Findings: ?Wt Readings from Last 3 Encounters:  ?12/18/21 190 lb (86.2 kg)  ?11/24/21 187 lb 3.2 oz (84.9 kg)  ?10/08/21 183 lb (83 kg)  ? ? height is 5\' 11"  (1.803 m) and weight is 190 lb (86.2 kg). His temporal temperature is 97.9 ?F (36.6 ?C). His blood pressure is 136/76 and his pulse is 86. His respiration is 20 and oxygen saturation is 96%.   ?General: Alert and oriented, in no acute distress.    ?HEENT: No oral or upper throat lesions.  Stable postsurgical site in left oral cavity. ?Neck:  He has fibrosis concentrated in the left neck consistent with prior surgery and radiation. No palpable masses noted.    ?Heart regular in rate and rhythm ?Chest clear to auscultation bilaterally ?Skin: Over face and neck his skin is dry, intact  ?Psych: appropriate affect, judgment appears intact.  ? ? ? ?Lab Findings: ?Lab Results  ?Component Value Date  ? WBC 7.7 12/02/2020  ? HGB 14.8 12/02/2020  ? HCT 46.0 12/02/2020  ? MCV 85.2 12/02/2020  ? PLT 219 12/02/2020  ? ? ?Lab Results  ?Component Value Date  ? TSH 2.738 12/12/2020  ? ? ?Radiographic Findings: ?No results found. ? ? ?Impression/Plan:   ? ?1) Head and Neck Cancer Status: NED - he is likely cured, and his quality of life is stable ? ?2) Nutritional Status:  stable ? ?Wt Readings from Last 3 Encounters:  ?12/18/21 190 lb (86.2 kg)  ?11/24/21 187 lb 3.2 oz (84.9 kg)  ?10/08/21 183 lb (83 kg)  ? ? ?3) Risk Factors: The patient has been educated about risk factors including alcohol and tobacco abuse; they understand that avoidance of alcohol and tobacco is important to prevent recurrences as  well as other cancers. The patient is not using tobacco.   ? ?4) Swallowing: He has a history of dysphagia - he is now following with gastroenterology and may have an EGD in the future ? ?5) Dental: Encouraged to continue regular followup with dentistry, and dental hygiene including fluoride as recommended and flossing.  ? ?6) Thyroid function: Within normal limits , continue checking annually (we can do this in our survivorship clinic if needed); on July 24, 2021 his TSH was normal at 1.98 at [facility] ?Lab Results  ?Component  Value Date  ? TSH 2.738 12/12/2020  ? ? ?7) Other: He is ready to enroll in our survivorship program here at the cancer center.  We will make a referral to her him to be seen in 6 months.  I will see him back on as-needed basis.  He is pleased with this plan.  We will continue to follow with ENT as per their recommendations. ? ?On date of service, in total, I spent 25 minutes on this encounter. Patient was seen in person. ? ?_____________________________________ ? ? ?[name], MD ? ?  ?

## 2021-12-28 NOTE — Patient Instructions (Signed)
? ? ? ? ? ? ? ? ? ? Colin Wagner ? 12/28/2021  ?  ? @PREFPERIOPPHARMACY @ ? ? Your procedure is scheduled on  12/31/2021. ? ? Report to [facility] at  [place]  A.M. ? ? Call this number if you have problems the morning of surgery: ? [phone] ? ? Remember: ? Follow the diet and prep instructions given to you by the office. ?  ? ?  Take 12 units of your levemir the night before your procedure. ? ?  DO NOT take any medications for diabetes the morning of your procedure. ?   ? ? Take these medicines the morning of surgery with A SIP OF WATER  ? ?    amlodipine, flexeril(if needed), hydrocodone(if needed), ativan ( if needed), protonix. ?  ? ? Do not wear jewelry, make-up or nail polish. ? Do not wear lotions, powders, or perfumes, or deodorant. ? Do not shave 48 hours prior to surgery.  Men may shave face and neck. ? Do not bring valuables to the hospital. ? [facility] is not responsible for any belongings or valuables. ? ?Contacts, dentures or bridgework may not be worn into surgery.  Leave your suitcase in the car.  After surgery it may be brought to your room. ? ?For patients admitted to the hospital, discharge time will be determined by your treatment team. ? ?Patients discharged the day of surgery will not be allowed to drive home and must have someone with them for 24 hours ? ? ?Special instructions:   DO NOT smoke tobacco or vape for 24 hours before your procedure. ? ?Please read over the following fact sheets that you were given. ?Anesthesia Post-op Instructions and Care and Recovery After Surgery ?  ? ? ? Upper Endoscopy, Adult, Care After ?This sheet gives you information about how to care for yourself after your procedure. Your health care provider may also give you more specific instructions. If you have problems or questions, contact your health care provider. ?What can I expect after the procedure? ?After the procedure, it is common to have: ?A sore throat. ?Mild stomach pain or  discomfort. ?Bloating. ?Nausea. ?Follow these instructions at home: ? ?Follow instructions from your health care provider about what to eat or drink after your procedure. ?Return to your normal activities as told by your health care provider. Ask your health care provider what activities are safe for you. ?Take over-the-counter and prescription medicines only as told by your health care provider. ?If you were given a sedative during the procedure, it can affect you for several hours. Do not drive or operate machinery until your health care provider says that it is safe. ?Keep all follow-up visits as told by your health care provider. This is important. ?Contact a health care provider if you have: ?A sore throat that lasts longer than one day. ?Trouble swallowing. ?Get help right away if: ?You vomit blood or your vomit looks like coffee grounds. ?You have: ?A fever. ?Bloody, black, or tarry stools. ?A severe sore throat or you cannot swallow. ?Difficulty breathing. ?Severe pain in your chest or abdomen. ?Summary ?After the procedure, it is common to have a sore throat, mild stomach discomfort, bloating, and nausea. ?If you were given a sedative during the procedure, it can affect you for several hours. Do not drive or operate machinery until your health care provider says that it is safe. ?Follow instructions from your health care provider about what to eat or drink after your procedure. ?  Return to your normal activities as told by your health care provider. ?This information is not intended to replace advice given to you by your health care provider. Make sure you discuss any questions you have with your health care provider. ?Document Revised: 06/15/2019 Document Reviewed: 01/09/2018 ?Elsevier Patient Education ? [place]. ?Esophageal Dilatation ?Esophageal dilatation, also called esophageal dilation, is a procedure to widen or open a blocked or narrowed part of the esophagus. The esophagus is the part of  the body that moves food and liquid from the mouth to the stomach. You may need this procedure if: ?You have a buildup of scar tissue in your esophagus that makes it difficult, painful, or impossible to swallow. This can be caused by gastroesophageal reflux disease (GERD). ?You have cancer of the esophagus. ?There is a problem with how food moves through your esophagus. ?In some cases, you may need this procedure repeated at a later time to dilate the esophagus gradually. ?Tell a health care provider about: ?Any allergies you have. ?All medicines you are taking, including vitamins, herbs, eye drops, creams, and over-the-counter medicines. ?Any problems you or family members have had with anesthetic medicines. ?Any blood disorders you have. ?Any surgeries you have had. ?Any medical conditions you have. ?Any antibiotic medicines you are required to take before dental procedures. ?Whether you are pregnant or may be pregnant. ?What are the risks? ?Generally, this is a safe procedure. However, problems may occur, including: ?Bleeding due to a tear in the lining of the esophagus. ?A hole, or perforation, in the esophagus. ?What happens before the procedure? ?Ask your health care provider about: ?Changing or stopping your regular medicines. This is especially important if you are taking diabetes medicines or blood thinners. ?Taking medicines such as aspirin and ibuprofen. These medicines can thin your blood. Do not take these medicines unless your health care provider tells you to take them. ?Taking over-the-counter medicines, vitamins, herbs, and supplements. ?Follow instructions from your health care provider about eating or drinking restrictions. ?Plan to have a responsible adult take you home from the hospital or clinic. ?Plan to have a responsible adult care for you for the time you are told after you leave the hospital or clinic. This is important. ?What happens during the procedure? ?You may be given a medicine to  help you relax (sedative). ?A numbing medicine may be sprayed into the back of your throat, or you may gargle the medicine. ?Your health care provider may perform the dilatation using various surgical instruments, such as: ?Simple dilators. This instrument is carefully placed in the esophagus to stretch it. ?Guided wire bougies. This involves using an endoscope to insert a wire into the esophagus. A dilator is passed over this wire to enlarge the esophagus. Then the wire is removed. ?Balloon dilators. An endoscope with a small balloon is inserted into the esophagus. The balloon is inflated to stretch the esophagus and open it up. ?The procedure may vary among health care providers and hospitals. ?What can I expect after the procedure? ?Your blood pressure, heart rate, breathing rate, and blood oxygen level will be monitored until you leave the hospital or clinic. ?Your throat may feel slightly sore and numb. This will get better over time. ?You will not be allowed to eat or drink until your throat is no longer numb. ?When you are able to drink, urinate, and sit on the edge of the bed without nausea or dizziness, you may be able to return home. ?Follow these instructions at  home: ?Take over-the-counter and prescription medicines only as told by your health care provider. ?If you were given a sedative during the procedure, it can affect you for several hours. Do not drive or operate machinery until your health care provider says that it is safe. ?Plan to have a responsible adult care for you for the time you are told. This is important. ?Follow instructions from your health care provider about any eating or drinking restrictions. ?Do not use any products that contain nicotine or tobacco, such as cigarettes, e-cigarettes, and chewing tobacco. If you need help quitting, ask your health care provider. ?Keep all follow-up visits. This is important. ?Contact a health care provider if: ?You have a fever. ?You have pain that  is not relieved by medicine. ?Get help right away if: ?You have chest pain. ?You have trouble breathing. ?You have trouble swallowing. ?You vomit blood. ?You have black, tarry, or bloody stools. ?These symptoms may represent

## 2021-12-29 ENCOUNTER — Encounter (HOSPITAL_COMMUNITY)
Admission: RE | Admit: 2021-12-29 | Discharge: 2021-12-29 | Disposition: A | Discharge status: Home / Self Care | Payer: Medicare Other | Source: Ambulatory Visit | Attending: Internal Medicine | Admitting: Internal Medicine

## 2021-12-29 ENCOUNTER — Encounter (HOSPITAL_COMMUNITY): Payer: Self-pay

## 2021-12-29 ENCOUNTER — Other Ambulatory Visit (HOSPITAL_COMMUNITY): Payer: Self-pay | Admitting: Nurse Practitioner

## 2021-12-29 ENCOUNTER — Other Ambulatory Visit (HOSPITAL_COMMUNITY): Payer: Medicare Other

## 2021-12-29 VITALS — BP 135/76 | HR 89 | Temp 97.9°F | Resp 18 | Ht 71.0 in | Wt 190.0 lb

## 2021-12-29 DIAGNOSIS — D509 Iron deficiency anemia, unspecified: Secondary | ICD-10-CM | POA: Insufficient documentation

## 2021-12-29 DIAGNOSIS — I10 Essential (primary) hypertension: Secondary | ICD-10-CM | POA: Diagnosis not present

## 2021-12-29 DIAGNOSIS — M25551 Pain in right hip: Secondary | ICD-10-CM

## 2021-12-29 DIAGNOSIS — Z01818 Encounter for other preprocedural examination: Secondary | ICD-10-CM | POA: Insufficient documentation

## 2021-12-29 DIAGNOSIS — E11319 Type 2 diabetes mellitus with unspecified diabetic retinopathy without macular edema: Secondary | ICD-10-CM | POA: Insufficient documentation

## 2021-12-29 DIAGNOSIS — Z794 Long term (current) use of insulin: Secondary | ICD-10-CM | POA: Diagnosis not present

## 2021-12-29 LAB — BASIC METABOLIC PANEL
Anion gap: 9 (ref 5–15)
BUN: 43 mg/dL — ABNORMAL HIGH (ref 8–23)
CO2: 24 mmol/L (ref 22–32)
Calcium: 9.1 mg/dL (ref 8.9–10.3)
Chloride: 102 mmol/L (ref 98–111)
Creatinine, Ser: 1.77 mg/dL — ABNORMAL HIGH (ref 0.61–1.24)
GFR, Estimated: 41 mL/min — ABNORMAL LOW (ref >60)
Glucose, Bld: 277 mg/dL — ABNORMAL HIGH (ref 70–99)
Potassium: 4.2 mmol/L (ref 3.5–5.1)
Sodium: 135 mmol/L (ref 135–145)

## 2021-12-29 LAB — CBC WITH DIFFERENTIAL/PLATELET
Abs Immature Granulocytes: 0.01 10*3/uL (ref 0.00–0.07)
Basophils Absolute: 0 10*3/uL (ref 0.0–0.1)
Basophils Relative: 0 %
Eosinophils Absolute: 0.1 10*3/uL (ref 0.0–0.5)
Eosinophils Relative: 2 %
HCT: 43.1 % (ref 39.0–52.0)
Hemoglobin: 13.7 g/dL (ref 13.0–17.0)
Immature Granulocytes: 0 %
Lymphocytes Relative: 10 %
Lymphs Abs: 0.7 10*3/uL (ref 0.7–4.0)
MCH: 25.4 pg — ABNORMAL LOW (ref 26.0–34.0)
MCHC: 31.8 g/dL (ref 30.0–36.0)
MCV: 79.8 fL — ABNORMAL LOW (ref 80.0–100.0)
Monocytes Absolute: 0.5 10*3/uL (ref 0.1–1.0)
Monocytes Relative: 7 %
Neutro Abs: 5.8 10*3/uL (ref 1.7–7.7)
Neutrophils Relative %: 81 %
Platelets: 294 10*3/uL (ref 150–400)
RBC: 5.4 MIL/uL (ref 4.22–5.81)
RDW: 17 % — ABNORMAL HIGH (ref 11.5–15.5)
WBC: 7.2 10*3/uL (ref 4.0–10.5)
nRBC: 0 % (ref 0.0–0.2)

## 2021-12-31 ENCOUNTER — Encounter (HOSPITAL_COMMUNITY): Payer: Self-pay | Admitting: Internal Medicine

## 2021-12-31 ENCOUNTER — Ambulatory Visit (HOSPITAL_BASED_OUTPATIENT_CLINIC_OR_DEPARTMENT_OTHER): Payer: Medicare Other | Admitting: Anesthesiology

## 2021-12-31 ENCOUNTER — Encounter (HOSPITAL_COMMUNITY)
Admission: RE | Disposition: A | Discharge status: Home / Self Care | Payer: Self-pay | Source: Home / Self Care | Attending: Internal Medicine

## 2021-12-31 ENCOUNTER — Ambulatory Visit (HOSPITAL_COMMUNITY): Payer: Medicare Other | Admitting: Anesthesiology

## 2021-12-31 ENCOUNTER — Ambulatory Visit (HOSPITAL_COMMUNITY)
Admission: RE | Admit: 2021-12-31 | Discharge: 2021-12-31 | Disposition: A | Discharge status: Home / Self Care | Payer: Medicare Other | Attending: Internal Medicine | Admitting: Internal Medicine

## 2021-12-31 DIAGNOSIS — K5730 Diverticulosis of large intestine without perforation or abscess without bleeding: Secondary | ICD-10-CM | POA: Diagnosis not present

## 2021-12-31 DIAGNOSIS — Z85818 Personal history of malignant neoplasm of other sites of lip, oral cavity, and pharynx: Secondary | ICD-10-CM | POA: Diagnosis not present

## 2021-12-31 DIAGNOSIS — K635 Polyp of colon: Secondary | ICD-10-CM

## 2021-12-31 DIAGNOSIS — Z923 Personal history of irradiation: Secondary | ICD-10-CM | POA: Insufficient documentation

## 2021-12-31 DIAGNOSIS — K219 Gastro-esophageal reflux disease without esophagitis: Secondary | ICD-10-CM | POA: Diagnosis not present

## 2021-12-31 DIAGNOSIS — K6389 Other specified diseases of intestine: Secondary | ICD-10-CM | POA: Insufficient documentation

## 2021-12-31 DIAGNOSIS — R1310 Dysphagia, unspecified: Secondary | ICD-10-CM

## 2021-12-31 DIAGNOSIS — Z1211 Encounter for screening for malignant neoplasm of colon: Secondary | ICD-10-CM | POA: Diagnosis not present

## 2021-12-31 DIAGNOSIS — Z7984 Long term (current) use of oral hypoglycemic drugs: Secondary | ICD-10-CM | POA: Insufficient documentation

## 2021-12-31 DIAGNOSIS — Z8711 Personal history of peptic ulcer disease: Secondary | ICD-10-CM | POA: Diagnosis not present

## 2021-12-31 DIAGNOSIS — Z794 Long term (current) use of insulin: Secondary | ICD-10-CM | POA: Insufficient documentation

## 2021-12-31 DIAGNOSIS — D122 Benign neoplasm of ascending colon: Secondary | ICD-10-CM | POA: Diagnosis not present

## 2021-12-31 DIAGNOSIS — D649 Anemia, unspecified: Secondary | ICD-10-CM | POA: Diagnosis not present

## 2021-12-31 DIAGNOSIS — K222 Esophageal obstruction: Secondary | ICD-10-CM | POA: Insufficient documentation

## 2021-12-31 DIAGNOSIS — J449 Chronic obstructive pulmonary disease, unspecified: Secondary | ICD-10-CM | POA: Diagnosis not present

## 2021-12-31 DIAGNOSIS — N189 Chronic kidney disease, unspecified: Secondary | ICD-10-CM | POA: Diagnosis not present

## 2021-12-31 DIAGNOSIS — Z86010 Personal history of colonic polyps: Secondary | ICD-10-CM | POA: Insufficient documentation

## 2021-12-31 DIAGNOSIS — I129 Hypertensive chronic kidney disease with stage 1 through stage 4 chronic kidney disease, or unspecified chronic kidney disease: Secondary | ICD-10-CM | POA: Diagnosis not present

## 2021-12-31 DIAGNOSIS — E1122 Type 2 diabetes mellitus with diabetic chronic kidney disease: Secondary | ICD-10-CM | POA: Insufficient documentation

## 2021-12-31 HISTORY — PX: POLYPECTOMY: SHX149

## 2021-12-31 HISTORY — PX: BALLOON DILATION: SHX5330

## 2021-12-31 HISTORY — PX: COLONOSCOPY WITH PROPOFOL: SHX5780

## 2021-12-31 HISTORY — PX: ESOPHAGOGASTRODUODENOSCOPY (EGD) WITH PROPOFOL: SHX5813

## 2021-12-31 LAB — GLUCOSE, CAPILLARY: Glucose-Capillary: 79 mg/dL (ref 70–99)

## 2021-12-31 SURGERY — COLONOSCOPY WITH PROPOFOL
Anesthesia: General

## 2021-12-31 MED ORDER — PROPOFOL 500 MG/50ML IV EMUL
INTRAVENOUS | Status: AC
  Filled 2021-12-31: qty 50

## 2021-12-31 MED ORDER — ETOMIDATE 2 MG/ML IV SOLN
INTRAVENOUS | Status: DC | PRN
  Administered 2021-12-31: 6 mg via INTRAVENOUS

## 2021-12-31 MED ORDER — LIDOCAINE HCL (CARDIAC) PF 100 MG/5ML IV SOSY
PREFILLED_SYRINGE | INTRAVENOUS | Status: DC | PRN
  Administered 2021-12-31: 50 mg via INTRAVENOUS

## 2021-12-31 MED ORDER — LIDOCAINE HCL (PF) 2 % IJ SOLN
INTRAMUSCULAR | Status: AC
  Filled 2021-12-31: qty 5

## 2021-12-31 MED ORDER — SODIUM CHLORIDE (PF) 0.9 % IJ SOLN
INTRAMUSCULAR | Status: AC
  Filled 2021-12-31: qty 10

## 2021-12-31 MED ORDER — PROPOFOL 10 MG/ML IV BOLUS
INTRAVENOUS | Status: DC | PRN
  Administered 2021-12-31: 50 mg via INTRAVENOUS

## 2021-12-31 MED ORDER — ETOMIDATE 2 MG/ML IV SOLN
INTRAVENOUS | Status: AC
  Filled 2021-12-31: qty 10

## 2021-12-31 MED ORDER — PHENYLEPHRINE HCL (PRESSORS) 10 MG/ML IV SOLN
INTRAVENOUS | Status: DC | PRN
  Administered 2021-12-31 (×4): 50 ug via INTRAVENOUS

## 2021-12-31 MED ORDER — LACTATED RINGERS IV SOLN
INTRAVENOUS | Status: DC | PRN
Start: 2021-12-31 — End: 2021-12-31

## 2021-12-31 MED ORDER — PHENYLEPHRINE HCL (PRESSORS) 10 MG/ML IV SOLN
INTRAVENOUS | Status: AC
  Filled 2021-12-31: qty 1

## 2021-12-31 MED ORDER — PROPOFOL 500 MG/50ML IV EMUL
INTRAVENOUS | Status: DC | PRN
  Administered 2021-12-31: 180 ug/kg/min via INTRAVENOUS

## 2021-12-31 NOTE — Anesthesia Postprocedure Evaluation (Signed)
Anesthesia Post Note ? ?Patient: Colin Wagner ? ?Procedure(s) Performed: COLONOSCOPY WITH PROPOFOL ?ESOPHAGOGASTRODUODENOSCOPY (EGD) WITH PROPOFOL ?BALLOON DILATION ?POLYPECTOMY INTESTINAL ? ?Patient location during evaluation: Phase II ?Anesthesia Type: General ?Level of consciousness: awake ?Pain management: pain level controlled ?Vital Signs Assessment: post-procedure vital signs reviewed and stable ?Respiratory status: spontaneous breathing and respiratory function stable ?Cardiovascular status: blood pressure returned to baseline and stable ?Postop Assessment: no headache and no apparent nausea or vomiting ?Anesthetic complications: no ?Comments: Late entry ? ? ?No notable events documented. ? ? ?Last Vitals:  ?Vitals:  ? 12/31/21 0907 12/31/21 0914  ?BP: (!) 127/100 129/68  ?Pulse: 81   ?Resp: 18   ?Temp: 36.6 ?C   ?SpO2: 95%   ?  ?Last Pain:  ?Vitals:  ? 12/31/21 0907  ?TempSrc: Axillary  ?PainSc: Asleep  ? ? ?  ?  ?  ?  ?  ?  ? ?Louann Sjogren ? ? ? ? ?

## 2021-12-31 NOTE — H&P (Addendum)
@LOGO @ ? ? ?Primary Care Physician:  [name], MD ?Primary Gastroenterologist:  Dr. [name] ? ?Pre-Procedure History & Physical: ?HPI:  Colin Wagner is a 71 y.o. male here for  surveillance colonoscopy.  Diagnostic EGD possible esophageal dilation.  History of head neck cancer. ? ?Past Medical History:  ?Diagnosis Date  ? Alcohol abuse   ? Alcoholic pancreatitis 1660  ? admission  ? Chronic back pain   ? Chronic pancreatitis ([facility])   ? based on ct findings 2016  ? COPD (chronic obstructive pulmonary disease) ([facility])   ? Diabetes mellitus   ? type 2  ? Diverticulosis   ? Gastritis   ? GERD (gastroesophageal reflux disease)   ? Headache   ? History of radiation therapy 05/12/17- 06/22/17  ? Left cheek and bilateral neck/ 60 Gy in 30 fractions to gross disease  ? Hyperlipidemia   ? Hypertension   ? 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 left jaw part of jaw bone removed  ? Peptic ulcer disease 1999  ? Per medical reports, no H pylori  ? Pneumonia   ? 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 he tells me that he has been released, ?and that he is free of cancer, and never had it to begin with.   ? Right shoulder pain   ? Testicular hypofunction   ? ? ?Past Surgical History:  ?Procedure Laterality Date  ? CAROTID STENT  06/2018  ? COLONOSCOPY  2003  ? Dr. [name], polyps  ? COLONOSCOPY  2005  ? Dr. [name], multiple diverticula  ? COLONOSCOPY  2008  ? Dr. [name], diverticulosis  ? COLONOSCOPY WITH PROPOFOL N/A 10/21/2016  ? Dr. [name]: Diverticulosis, two 5-7 mm polyps removed. path-tubular adenomas.  Next colonoscopy in 5 years.  ? ESOPHAGOGASTRODUODENOSCOPY    ? Multiple EGDs. 1999 EGD showed gastric ulcers, no H pylori and benign biopsies performed by Dr. [name]. 2001 gastric ulcer healed. Last EGD 2005 had gastritis.  ? IR GASTROSTOMY TUBE REMOVAL  02/08/2020  ? IR REPLACE G-TUBE SIMPLE WO FLUORO  03/20/2019  ? IR REPLACE G-TUBE SIMPLE WO FLUORO  09/21/2019  ? IR REPLC GASTRO/COLONIC TUBE PERCUT W/FLUORO  05/16/2018  ? Left partial  mandibulectomy, Scapular free flap reconstruction, selective neck dissection, tracheotomy, and resection of intraoral palate cancer. Left 03/01/2017  ? [facility]  ? LUNG BIOPSY    ? MOUTH SURGERY    ? PARTIAL NEPHRECTOMY Left 2012  ? POLYPECTOMY  10/21/2016  ? Procedure: POLYPECTOMY;  Surgeon: [name], MD;  Location: [facility];  Service: Endoscopy;;  hepatic flexure x2  ? SURGERY OF LIP  06/2019  ? lip revision  ? TRANSURETHRAL RESECTION OF PROSTATE N/A 03/01/2019  ? Procedure: TRANSURETHRAL RESECTION OF THE PROSTATE (TURP)WITH CYSTOSCOPY;  Surgeon: Raynelle Bring, MD;  Location: [facility] ORS;  Service: Urology;  Laterality: N/A;  ? ? ?Prior to Admission medications   ?Medication Sig Start Date End Date Taking? Authorizing Provider  ?acetaminophen (TYLENOL) 500 MG tablet Take 1,000 mg by mouth every 6 (six) hours as needed for mild pain or moderate pain.   Yes [provider]  ?amLODipine (NORVASC) 5 MG tablet Take 5 mg by mouth daily. 12/02/20  Yes [provider]  ?aspirin EC 81 MG tablet Take 81 mg by mouth daily.   Yes [provider]  ?atorvastatin (LIPITOR) 40 MG tablet Take 40 mg by mouth daily at 6 PM.  02/14/17  Yes [provider]  ?  cholecalciferol (VITAMIN D) 25 MCG (1000 UNIT) tablet Take 1,000 Units by mouth daily.   Yes [provider]  ?clopidogrel (PLAVIX) 75 MG tablet Take 75 mg by mouth daily. 05/27/19  Yes [provider]  ?cyclobenzaprine (FLEXERIL) 10 MG tablet Take 10 mg by mouth 4 (four) times daily as needed for muscle spasms. 09/06/18  Yes [provider]  ?ferrous sulfate 325 (65 FE) MG tablet Take 325 mg by mouth daily with breakfast.   Yes [provider]  ?fluticasone (FLONASE) 50 MCG/ACT nasal spray Place 2 sprays into both nostrils daily.   Yes [provider]  ?glipiZIDE (GLUCOTROL XL) 2.5 MG 24 hr tablet Take 2.5 mg by mouth every morning. 08/11/18  Yes [provider]   ?hydrochlorothiazide (HYDRODIURIL) 12.5 MG tablet Take 12.5 mg by mouth daily. 04/24/19  Yes [provider]  ?HYDROcodone-acetaminophen (NORCO) 7.5-325 MG tablet Take 1 tablet by mouth every 6 (six) hours as needed (pain). ?Patient taking differently: Take 1 tablet by mouth every 6 (six) hours as needed for moderate pain. 01/12/17  Yes [name], MD  ?Ernest Mallick FLEXTOUCH 100 UNIT/ML Pen Inject 40 Units into the skin at bedtime. X 6 more nights, then return back to 22 units at bedtime ?Patient taking differently: Inject 24 Units into the skin at bedtime. 07/31/19  Yes Tat, Shanon Brow, MD  ?levocetirizine (XYZAL) 5 MG tablet Take 5 mg by mouth every evening.   Yes [provider]  ?LORazepam (ATIVAN) 1 MG tablet Take 1 mg by mouth every 6 (six) hours as needed for anxiety or sleep. 03/31/15  Yes [provider]  ?pantoprazole (PROTONIX) 40 MG tablet Take 40 mg by mouth daily.    Yes [provider]  ?senna (SENOKOT) 8.6 MG TABS tablet Take 1 tablet (8.6 mg total) by mouth at bedtime as needed for mild constipation. May be needed as pain medication can constipate. 01/12/17  Yes [name], MD  ?Testosterone Cypionate 200 MG/ML SOLN Inject 100 mg as directed every 14 (fourteen) days.   Yes [provider]  ?traZODone (DESYREL) 50 MG tablet Take 100 mg by mouth at bedtime.    Yes [provider]  ?zinc gluconate 50 MG tablet Take 50 mg by mouth 2 (two) times a week.   Yes [provider]  ?polyethylene glycol-electrolytes (TRILYTE) 420 g solution Take 4,000 mLs by mouth as directed. 11/24/21   Jazzmin Newbold, Cristopher Estimable, MD  ?tadalafil (CIALIS) 20 MG tablet Take 1 tablet (20 mg total) by mouth daily as needed for erectile dysfunction. Take 30-60 minutes prior to intercourse 11/19/21   Stoneking, Reece Leader., MD  ? ? ?Allergies as of 11/24/2021  ? (No Known Allergies)  ? ? ?Family History  ?Problem Relation Age of Onset  ? Hypertension Mother   ? Colon cancer Neg Hx   ? ? ?Social  History  ? ?Socioeconomic History  ? Marital status: Divorced  ?  Spouse name: Not on file  ? Number of children: 0  ? Years of education: Not on file  ? Highest education level: Not on file  ?Occupational History  ? Not on file  ?Tobacco Use  ? Smoking status: Former  ?  Packs/day: 0.50  ?  Years: 35.00  ?  Pack years: 17.50  ?  Types: Cigarettes  ? Smokeless tobacco: Never  ? Tobacco comments:  ?  quit 02-2017  ?Vaping Use  ? Vaping Use: Never used  ?Substance and Sexual Activity  ? Alcohol use: No  ?  Comment: quit 2000 but relapse in 2016. no etoh since hospitalized 2016.   ? Drug use: No  ? Sexual activity: Not Currently  ?Other Topics Concern  ? Not on file  ?Social History Narrative  ? 01/06/2017  ? 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  ? Patient with a history of smoking half pack a day for 35+ years. Patient currently only smoking a few cigarettes per day.  ? The patient has history of alcohol abuse. Patient quit in 2000 and then relapsed in 2016. Patient has not had any alcohol since 2016.  ? ?Social Determinants of Health  ? ?Financial Resource Strain: Not on file  ?Food Insecurity: Not on file  ?Transportation Needs: Not on file  ?Physical Activity: Not on file  ?Stress: Not on file  ?Social Connections: Not on file  ?Intimate Partner Violence: Not on file  ? ? ?Review of Systems: ?See HPI, otherwise negative ROS ? ?Physical Exam: ?BP (!) 161/81   Pulse 98   Temp (!) 97.4 ?F (36.3 ?C) (Oral)   Ht 5\' 11"  (1.803 m)   Wt 86.2 kg   SpO2 97%   BMI 26.50 kg/m?  ?General:   Alert,  Well-developed, well-nourished, pleasant and cooperative in NAD ?Mouth:  No deformity or lesions. ?Neck:  Supple; no masses or thyromegaly. No significant cervical adenopathy. ?Lungs:  Clear throughout to auscultation.   No wheezes, crackles, or rhonchi. No acute distress. ?Heart:  Regular rate and rhythm; no murmurs, clicks, rubs,  or gallops. ?Abdomen: Non-distended, normal bowel sounds.  Soft and nontender  without appreciable mass or hepatosplenomegaly.  ?Pulses:  Normal pulses noted. ?Extremities:  Without clubbing or edema. ? ?Impression/Plan:    ? 71 year old gentleman with esophageal dysphagia history of head ne

## 2021-12-31 NOTE — Anesthesia Preprocedure Evaluation (Signed)
Anesthesia Evaluation  ?Patient identified by MRN, date of birth, ID band ?Patient awake ? ? ? ?Reviewed: ?Allergy & Precautions, H&P , NPO status , Patient's Chart, lab work & pertinent test results, reviewed documented beta blocker date and time  ? ?Airway ?Mallampati: II ? ?TM Distance: >3 FB ?Neck ROM: full ? ? ? Dental ?no notable dental hx. ? ?  ?Pulmonary ?COPD, former smoker,  ?  ?Pulmonary exam normal ?breath sounds clear to auscultation ? ? ? ? ? ? Cardiovascular ?Exercise Tolerance: Good ?hypertension, negative cardio ROS ? ? ?Rhythm:regular Rate:Normal ? ? ?  ?Neuro/Psych ? Headaches, negative psych ROS  ? GI/Hepatic ?Neg liver ROS, PUD, GERD  Medicated,  ?Endo/Other  ?negative endocrine ROSdiabetes ? Renal/GU ?CRFRenal disease  ?negative genitourinary ?  ?Musculoskeletal ? ? Abdominal ?  ?Peds ? Hematology ? ?(+) Blood dyscrasia, anemia ,   ?Anesthesia Other Findings ? ? Reproductive/Obstetrics ?negative OB ROS ? ?  ? ? ? ? ? ? ? ? ? ? ? ? ? ?  ?  ? ? ? ? ? ? ? ? ?Anesthesia Physical ?Anesthesia Plan ? ?ASA: 3 ? ?Anesthesia Plan: General  ? ?Post-op Pain Management:   ? ?Induction:  ? ?PONV Risk Score and Plan: Propofol infusion ? ?Airway Management Planned:  ? ?Additional Equipment:  ? ?Intra-op Plan:  ? ?Post-operative Plan:  ? ?Informed Consent: I have reviewed the patients History and Physical, chart, labs and discussed the procedure including the risks, benefits and alternatives for the proposed anesthesia with the patient or authorized representative who has indicated his/her understanding and acceptance.  ? ? ? ?Dental Advisory Given ? ?Plan Discussed with: CRNA ? ?Anesthesia Plan Comments:   ? ? ? ? ? ? ?Anesthesia Quick Evaluation ? ?

## 2021-12-31 NOTE — Op Note (Signed)
[facility] ?Patient Name: Colin Wagner ?Procedure Date: 12/31/2021 7:56 AM ?MRN: [id number] ?Date of Birth: Jun 21, 1951 ?Attending MD: Norvel Richards , MD ?CSN: [id number] ?Age: 71 ?Admit Type: Outpatient ?Procedure:                Upper GI endoscopy ?Indications:              Dysphagia ?Providers:                Norvel Richards, MD, Janeece Riggers, RN, Crystal  ?                          Page ?Referring MD:              ?Medicines:                Propofol per Anesthesia ?Complications:            No immediate complications. ?Estimated Blood Loss:     Estimated blood loss was minimal. ?Procedure:                Pre-Anesthesia Assessment: ?                          - Prior to the procedure, a History and Physical  ?                          was performed, and patient medications and  ?                          allergies were reviewed. The patient's tolerance of  ?                          previous anesthesia was also reviewed. The risks  ?                          and benefits of the procedure and the sedation  ?                          options and risks were discussed with the patient.  ?                          All questions were answered, and informed consent  ?                          was obtained. Prior Anticoagulants: The patient has  ?                          taken no previous anticoagulant or antiplatelet  ?                          agents. ASA Grade Assessment: III - A patient with  ?                          severe systemic disease. After reviewing the risks  ?  and benefits, the patient was deemed in  ?                          satisfactory condition to undergo the procedure. ?                          After obtaining informed consent, the endoscope was  ?                          passed under direct vision. Throughout the  ?                          procedure, the patient's blood pressure, pulse, and  ?                          oxygen saturations were monitored  continuously. The  ?                          GIF-H190 ([id number]) scope was introduced through the  ?                          mouth, and advanced to the second part of duodenum.  ?                          The upper GI endoscopy was accomplished without  ?                          difficulty. The patient tolerated the procedure  ?                          well. ?Scope In: 8:10:51 AM ?Scope Out: 8:30:26 AM ?Total Procedure Duration: 0 hours 19 minutes 35 seconds  ?Findings: ?     Abnormal upper esophageal sphincter. Radiation changes apparent. Patient  ?     had an apparent "bar" at the opening of the upper esophageal sphincter  ?     with short narrowing into the UES into the otherwise normal-appearing  ?     tubular esophagus. No evidence of recurrent tumor. Stomach empty.  ?     Gastric mucosa appeared normal. Patent pylorus. Normal-appearing first  ?     and second portion of the duodenum. ?     I obtained a 10 to 13 mm TTS balloon and placed it across the opening to  ?     the upper esophageal sphincter at the level of the "bar". Inflated to  ?     13. This area was markedly compliant and it was apparent at 13 mm was  ?     not large enough of a diameter. Balloon removed a 13-15 graduated  ?     balloon was placed the scope and gradually inflated to 15+ millimeters.  ?     It was held for about 30 seconds. The balloon was noted to rapidly  ?     deflate and become detached from the catheter. It was free-floating in  ?     the esophagus I gently pushed into the stomach and grabbed it with the  ?  rescue net and pulled out intact. I reintroduced the scope into the  ?     esophagus and stomach. There is no residual balloon components. In fact,  ?     the narrowing in the at the level of upper esophageal sphincter appeared  ?     to have been improved with dilation with minimal bleeding. The procedure  ?     was then concluded. ?Impression:               - Narrowing/deformity at the upper esophageal  ?                           sphincter???likely producing patient's dysphagia.  ?                          Status post balloon dilation as detailed above.  ?                          Otherwise, normal esophagus, stomach and D1-D2 ?                          -Patient may derive further benefit in dilation via  ?                          a bougie over the guidewire under fluoroscopic  ?                          control in the future. Ideally, patient will need  ?                          to come off his DAPT for such a maneuver. ?Moderate Sedation: ?     Moderate (conscious) sedation was personally administered by an  ?     anesthesia professional. The following parameters were monitored: oxygen  ?     saturation, heart rate, blood pressure, respiratory rate, EKG, adequacy  ?     of pulmonary ventilation, and response to care. ?Recommendation:           - Patient has a contact number available for  ?                          emergencies. The signs and symptoms of potential  ?                          delayed complications were discussed with the  ?                          patient. Return to normal activities tomorrow.  ?                          Written discharge instructions were provided to the  ?                          patient. ?                          - Continue present medications.  Office visit with  ?                          [place] in 3 months. See colonoscopy report. ?Procedure Code(s):        --- Professional --- ?                          [phone], Esophagogastroduodenoscopy, flexible,  ?                          transoral; diagnostic, including collection of  ?                          specimen(s) by brushing or washing, when performed  ?                          (separate procedure) ?Diagnosis Code(s):        --- Professional --- ?                          R13.10, Dysphagia, unspecified ?CPT copyright 2019 American [employer] Association. All rights reserved. ?The codes documented in this report are preliminary and upon coder  review may  ?be revised to meet current compliance requirements. ?Cristopher Estimable. Metztli Sachdev, MD ?Norvel Richards, MD ?12/31/2021 9:03:52 AM ?This report has been signed electronically. ?Number of Addenda: 0 ?

## 2021-12-31 NOTE — Op Note (Signed)
[facility] ?Patient Name: Colin Wagner ?Procedure Date: 12/31/2021 8:32 AM ?MRN: [id number] ?Date of Birth: 04/19/1951 ?Attending MD: Norvel Richards , MD ?CSN: [id number] ?Age: 71 ?Admit Type: Outpatient ?Procedure:                Colonoscopy ?Indications:              High risk colon cancer surveillance: Personal  ?                          history of colonic polyps ?Providers:                Norvel Richards, MD, Janeece Riggers, RN, Crystal  ?                          Page ?Referring MD:              ?Medicines:                Propofol per Anesthesia ?Complications:            No immediate complications. ?Estimated Blood Loss:     Estimated blood loss was minimal. ?Procedure:                Pre-Anesthesia Assessment: ?                          - Prior to the procedure, a History and Physical  ?                          was performed, and patient medications and  ?                          allergies were reviewed. The patient's tolerance of  ?                          previous anesthesia was also reviewed. The risks  ?                          and benefits of the procedure and the sedation  ?                          options and risks were discussed with the patient.  ?                          All questions were answered, and informed consent  ?                          was obtained. Prior Anticoagulants: The patient has  ?                          taken no previous anticoagulant or antiplatelet  ?                          agents. ASA Grade Assessment: III - A patient with  ?  severe systemic disease. After reviewing the risks  ?                          and benefits, the patient was deemed in  ?                          satisfactory condition to undergo the procedure. ?                          After obtaining informed consent, the colonoscope  ?                          was passed under direct vision. Throughout the  ?                          procedure, the patient's blood  pressure, pulse, and  ?                          oxygen saturations were monitored continuously. The  ?                          [id number]) scope was introduced through the  ?                          anus and advanced to the the cecum, identified by  ?                          appendiceal orifice and ileocecal valve. The  ?                          colonoscopy was performed without difficulty. The  ?                          patient tolerated the procedure well. The quality  ?                          of the bowel preparation was adequate. ?Scope In: 8:40:26 AM ?Scope Out: 9:02:47 AM ?Scope Withdrawal Time: 0 hours 11 minutes 14 seconds  ?Total Procedure Duration: 0 hours 22 minutes 21 seconds  ?Findings: ?     The perianal and digital rectal examinations were normal. Redundant and  ?     elongated colon. External abdominal pressure required to reach the  ?     cecum. 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 ?     A 5 mm polyp was found in the ascending colon. The polyp was sessile.  ?     The polyp was removed with a cold snare. Resection and retrieval were  ?     complete. Estimated blood loss was minimal. ?     A 8 mm polyp was found in the ascending colon. The polyp was sessile.  ?     The polyp was removed with a hot snare. Resection and retrieval were  ?     complete. Estimated blood loss: none. ?     Scattered small and large-mouthed diverticula were found in the  ?  ileocecal valve. ?     The exam was otherwise without abnormality on direct and retroflexion  ?     views. ?Impression:               - One 5 mm polyp in the ascending colon, removed  ?                          with a cold snare. Resected and retrieved. ?                          - One 8 mm polyp in the ascending colon, removed  ?                          with a hot snare. Resected and retrieved. ?                          - Diverticulosis at the ileocecal valve. Redundant  ?                          and  elongated colon. Melanosis coli. ?                          - The examination was otherwise normal on direct  ?                          and retroflexion views. ?Moderate Sedation: ?     Moderate (conscious) sedation was personally administered by an  ?     anesthesia professional. The following parameters were monitored: oxygen  ?     saturation, heart rate, blood pressure, respiratory rate, EKG, adequacy  ?     of pulmonary ventilation, and response to care. ?Recommendation:           - Patient has a contact number available for  ?                          emergencies. The signs and symptoms of potential  ?                          delayed complications were discussed with the  ?                          patient. Return to normal activities tomorrow.  ?                          Written discharge instructions were provided to the  ?                          patient. ?                          - Resume previous diet. ?                          - Continue present medications. ?                          -  Repeat colonoscopy date to be determined after  ?                          pending pathology results are reviewed for  ?                          surveillance. ?                          - Return to GI office in 3 months. See EGD report. ?Procedure Code(s):        --- Professional --- ?                          [phone], Colonoscopy, flexible; with removal of  ?                          tumor(s), polyp(s), or other lesion(s) by snare  ?                          technique ?Diagnosis Code(s):        --- Professional --- ?                          Z86.010, Personal history of colonic polyps ?                          K63.5, Polyp of colon ?                          K57.30, Diverticulosis of large intestine without  ?                          perforation or abscess without bleeding ?CPT copyright 2019 American Medical Association. All rights reserved. ?The codes documented in this report are preliminary and upon coder  review may  ?be revised to meet current compliance requirements. ?Cristopher Estimable. Jimmey Hengel, MD ?Norvel Richards, MD ?12/31/2021 9:12:55 AM ?This report has been signed electronically. ?Number of Addenda: 0 ?

## 2021-12-31 NOTE — Discharge Instructions (Signed)
?Colonoscopy ?Discharge Instructions ? ?Read the instructions outlined below and refer to this sheet in the next few weeks. These discharge instructions provide you with general information on caring for yourself after you leave the hospital. Your doctor may also give you specific instructions. While your treatment has been planned according to the most current medical practices available, unavoidable complications occasionally occur. If you have any problems or questions after discharge, call Dr. [name] at [phone]. ?ACTIVITY ?You may resume your regular activity, but move at a slower pace for the next 24 hours.  ?Take frequent rest periods for the next 24 hours.  ?Walking will help get rid of the air and reduce the bloated feeling in your belly (abdomen).  ?No driving for 24 hours (because of the medicine (anesthesia) used during the test).   ?Do not sign any important legal documents or operate any machinery for 24 hours (because of the anesthesia used during the test).  ?NUTRITION ?Drink plenty of fluids.  ?You may resume your normal diet as instructed by your doctor.  ?Begin with a light meal and progress to your normal diet. Heavy or fried foods are harder to digest and may make you feel sick to your stomach (nauseated).  ?Avoid alcoholic beverages for 24 hours or as instructed.  ?MEDICATIONS ?You may resume your normal medications unless your doctor tells you otherwise.  ?WHAT YOU CAN EXPECT TODAY ?Some feelings of bloating in the abdomen.  ?Passage of more gas than usual.  ?Spotting of blood in your stool or on the toilet paper.  ?IF YOU HAD POLYPS REMOVED DURING THE COLONOSCOPY: ?No aspirin products for 7 days or as instructed.  ?No alcohol for 7 days or as instructed.  ?Eat a soft diet for the next 24 hours.  ?FINDING OUT THE RESULTS OF YOUR TEST ?Not all test results are available during your visit. If your test results are not back during the visit, make an appointment with your caregiver to find out the  results. Do not assume everything is normal if you have not heard from your caregiver or the medical facility. It is important for you to follow up on all of your test results.  ?SEEK IMMEDIATE MEDICAL ATTENTION IF: ?You have more than a spotting of blood in your stool.  ?Your belly is swollen (abdominal distention).  ?You are nauseated or vomiting.  ?You have a temperature over 101.  ?You have abdominal pain or discomfort that is severe or gets worse throughout the day.   ?EGD ?Discharge instructions ?Please read the instructions outlined below and refer to this sheet in the next few weeks. These discharge instructions provide you with general information on caring for yourself after you leave the hospital. Your doctor may also give you specific instructions. While your treatment has been planned according to the most current medical practices available, unavoidable complications occasionally occur. If you have any problems or questions after discharge, please call your doctor. ?ACTIVITY ?You may resume your regular activity but move at a slower pace for the next 24 hours.  ?Take frequent rest periods for the next 24 hours.  ?Walking will help expel (get rid of) the air and reduce the bloated feeling in your abdomen.  ?No driving for 24 hours (because of the anesthesia (medicine) used during the test).  ?You may shower.  ?Do not sign any important legal documents or operate any machinery for 24 hours (because of the anesthesia used during the test).  ?NUTRITION ?Drink plenty of fluids.  ?You may  resume your normal diet.  ?Begin with a light meal and progress to your normal diet.  ?Avoid alcoholic beverages for 24 hours or as instructed by your caregiver.  ?MEDICATIONS ?You may resume your normal medications unless your caregiver tells you otherwise.  ?WHAT YOU CAN EXPECT TODAY ?You may experience abdominal discomfort such as a feeling of fullness or ?gas? pains.  ?FOLLOW-UP ?Your doctor will discuss the results of  your test with you.  ?SEEK IMMEDIATE MEDICAL ATTENTION IF ANY OF THE FOLLOWING OCCUR: ?Excessive nausea (feeling sick to your stomach) and/or vomiting.  ?Severe abdominal pain and distention (swelling).  ?Trouble swallowing.  ?Temperature over 101? F (37.8? C).  ?Rectal bleeding or vomiting of blood.   ? ? ?I was able to stretch your esophagus some today. ? ?  2 polyps removed from your colon. ? ?Colon polyp and diverticulosis information provided ? ? further recommendations to follow pending review of pathology report ? ? office visit with [place] in 3 months ? ? at patient request, I called [facility] at [phone] -  reviewed findings and recommendations ?

## 2021-12-31 NOTE — Transfer of Care (Signed)
Immediate Anesthesia Transfer of Care Note ? ?Patient: Colin Wagner ? ?Procedure(s) Performed: COLONOSCOPY WITH PROPOFOL ?ESOPHAGOGASTRODUODENOSCOPY (EGD) WITH PROPOFOL ?BALLOON DILATION ?POLYPECTOMY INTESTINAL ? ?Patient Location: PACU ? ?Anesthesia Type:General ? ?Level of Consciousness: awake, alert  and oriented ? ?Airway & Oxygen Therapy: Patient Spontanous Breathing ? ?Post-op Assessment: Report given to RN, Post -op Vital signs reviewed and stable, Patient moving all extremities X 4 and Patient able to stick tongue midline ? ?Post vital signs: Reviewed ? ?Last Vitals:  ?Vitals Value Taken Time  ?BP 164/92   ?Temp 97.8   ?Pulse 79   ?Resp 20   ?SpO2 95   ? ? ?Last Pain:  ?Vitals:  ? 12/31/21 0807  ?TempSrc:   ?PainSc: 0-No pain  ?   ? ?Patients Stated Pain Goal: 6 (12/31/21 0700) ? ?Complications: No notable events documented. ?

## 2022-01-01 LAB — SURGICAL PATHOLOGY

## 2022-01-03 ENCOUNTER — Encounter: Payer: Self-pay | Admitting: Internal Medicine

## 2022-01-07 ENCOUNTER — Encounter (HOSPITAL_COMMUNITY): Payer: Self-pay | Admitting: Internal Medicine

## 2022-01-15 ENCOUNTER — Ambulatory Visit
Admission: EM | Admit: 2022-01-15 | Discharge: 2022-01-15 | Disposition: A | Discharge status: Home / Self Care | Payer: Medicare Other | Attending: Nurse Practitioner | Admitting: Nurse Practitioner

## 2022-01-15 DIAGNOSIS — B3742 Candidal balanitis: Secondary | ICD-10-CM | POA: Diagnosis not present

## 2022-01-15 MED ORDER — FLUCONAZOLE 150 MG PO TABS
150.0000 mg | ORAL_TABLET | Freq: Every day | ORAL | 0 refills | Status: DC
Start: 2022-01-15 — End: 2022-02-07

## 2022-01-15 NOTE — ED Triage Notes (Signed)
Pt reports yeast infection in the penis started today. States he had itching in the penis this morning.. Pt requested fluconazole refill.

## 2022-01-15 NOTE — Discharge Instructions (Addendum)
Take medication as prescribed.  Take 1 tablet today.  If you continue to have symptoms after 3 days, take the second tablet.  If you continue to have symptoms 6 days, take the final tablet. Continue using the nystatin powder you currently have. Recommend eating more yogurt to help decrease your symptoms. Keep the area clean and dry. Follow-up with your urologist if your symptoms do not improve.

## 2022-01-15 NOTE — ED Provider Notes (Signed)
[facility]    CSN: [id number] Arrival date & time: 01/15/22  1534      History   Chief Complaint No chief complaint on file.   HPI Colin Wagner is a 71 y.o. male.   The patient is a 71 year old male who presents with itching and discharge around his penis.  Patient states symptoms started earlier today.  He denies urinary symptoms to include frequency, urgency, hematuria, dysuria, abdominal pain or flank pain.  Patient states he does have a history of yeast infections.  States that his last 1 was earlier this year.  Patient states that he is uncircumcised.  Patient is requesting prescription for fluconazole.  The history is provided by the patient.   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    Right shoulder pain    Testicular hypofunction     Patient Active Problem List   Diagnosis Date Noted   GERD (gastroesophageal reflux disease)    IDA (iron deficiency anemia)    GI bleed 12/04/2019   Anemia    Rectal bleed 11/05/2019   Acute respiratory failure with hypoxia ([place]) 07/29/2019   Pneumonia due to COVID-19 virus 07/28/2019   AKI (acute kidney injury) ([place]) 07/27/2019   Hyperkalemia 07/27/2019   COVID-19 virus infection  07/27/2019   Urinary retention due to benign prostatic hyperplasia 03/01/2019   Attention to G-tube ([place]) 02/08/2018   UTI (urinary tract infection) 04/20/2017   UTI due to Klebsiella species 04/20/2017   Essential hypertension 04/20/2017   Chronic pancreatitis ([place]) 04/20/2017   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   Malnutrition of moderate degree 70/62/3762   Complicated UTI (urinary tract infection)    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   Rectal bleeding 09/28/2016   Hyponatremia 04/28/2015   Abdominal pain 04/27/2015   Acute pancreatitis 04/27/2015   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 [name], polyps   COLONOSCOPY  2005   Dr. [name], multiple diverticula   COLONOSCOPY  2008   Dr. [name], diverticulosis   COLONOSCOPY WITH PROPOFOL N/A  10/21/2016   Dr. [name]: Diverticulosis, two 5-7 mm polyps removed. path-tubular adenomas.  Next colonoscopy in 5 years.   COLONOSCOPY WITH PROPOFOL N/A 12/31/2021   Procedure: COLONOSCOPY WITH PROPOFOL;  Surgeon: [name], MD;  [facility]: [facility];  Service: Endoscopy;  Laterality: N/A;  8:15am   ESOPHAGOGASTRODUODENOSCOPY     Multiple EGDs. 1999 EGD showed gastric ulcers, no H pylori and benign biopsies performed by Dr. [name]. 2001 gastric ulcer healed. Last EGD 2005 had gastritis.   ESOPHAGOGASTRODUODENOSCOPY (EGD) WITH PROPOFOL N/A 12/31/2021   Procedure: ESOPHAGOGASTRODUODENOSCOPY (EGD) WITH PROPOFOL;  Surgeon: [name], MD;   [facility];  Service: Endoscopy;  Laterality: N/A;   IR GASTROSTOMY TUBE REMOVAL  02/08/2020   IR REPLACE G-TUBE SIMPLE WO FLUORO  03/20/2019   IR REPLACE G-TUBE SIMPLE WO FLUORO  09/21/2019   IR REPLC GASTRO/COLONIC TUBE PERCUT W/FLUORO  05/16/2018   Left partial mandibulectomy, Scapular free flap reconstruction, selective neck dissection, tracheotomy, and resection of intraoral palate cancer. Left 03/01/2017   [facility]   LUNG BIOPSY     MOUTH SURGERY     PARTIAL NEPHRECTOMY Left 2012   POLYPECTOMY  10/21/2016   Procedure: POLYPECTOMY;  Surgeon: [name], MD;  [facility];  Service: Endoscopy;;  hepatic flexure x2   POLYPECTOMY  12/31/2021   Procedure: POLYPECTOMY INTESTINAL;  Surgeon: [name], MD;  Location: [facility];  Service: Endoscopy;;   SURGERY OF LIP  06/2019   lip revision   TRANSURETHRAL RESECTION OF PROSTATE N/A 03/01/2019   Procedure: TRANSURETHRAL RESECTION OF THE PROSTATE (TURP)WITH CYSTOSCOPY;  Surgeon: Raynelle Bring, MD;  Location: [facility];  Service: Urology;  Laterality: N/A;       Home Medications    Prior to Admission medications   Medication Sig Start Date End Date Taking? Authorizing Provider  fluconazole (DIFLUCAN) 150 MG tablet Take 1 tablet (150 mg total) by mouth daily. Take one tablet today, may repeat every 72 hours up to 2 additional doses for persistent symptoms. 01/15/22  Yes Axten Pascucci-Warren, Alda Lea, NP  acetaminophen (TYLENOL) 500 MG tablet Take 1,000 mg by mouth every 6 (six) hours as needed for mild pain or moderate pain.    [provider]  amLODipine (NORVASC) 5 MG tablet Take 5 mg by mouth daily. 12/02/20   [provider]  aspirin EC 81 MG tablet Take 81 mg by mouth daily.    [provider]  atorvastatin (LIPITOR) 40 MG tablet Take 40 mg by mouth daily at 6 PM.  02/14/17   [provider]  cholecalciferol (VITAMIN D) 25 MCG (1000 UNIT) tablet Take 1,000  Units by mouth daily.    [provider]  clopidogrel (PLAVIX) 75 MG tablet Take 75 mg by mouth daily. 05/27/19   [provider]  cyclobenzaprine (FLEXERIL) 10 MG tablet Take 10 mg by mouth 4 (four) times daily as needed for muscle spasms. 09/06/18   [provider]  ferrous sulfate 325 (65 FE) MG tablet Take 325 mg by mouth daily with breakfast.    [provider]  fluticasone (FLONASE) 50 MCG/ACT nasal spray Place 2 sprays into both nostrils daily.    [provider]  glipiZIDE (GLUCOTROL XL) 2.5 MG 24 hr tablet Take 2.5 mg by mouth every morning. 08/11/18   [provider]  hydrochlorothiazide (HYDRODIURIL) 12.5 MG tablet Take 12.5 mg by mouth daily. 04/24/19   [provider]  HYDROcodone-acetaminophen [facility])  7.5-325 MG tablet Take 1 tablet by mouth every 6 (six) hours as needed (pain). Patient taking differently: Take 1 tablet by mouth every 6 (six) hours as needed for moderate pain. 01/12/17   [name], MD  LEVEMIR FLEXTOUCH 100 UNIT/ML Pen Inject 40 Units into the skin at bedtime. X 6 more nights, then return back to 22 units at bedtime Patient taking differently: Inject 24 Units into the skin at bedtime. 07/31/19   Orson Eva, MD  levocetirizine (XYZAL) 5 MG tablet Take 5 mg by mouth every evening.    [provider]  LORazepam (ATIVAN) 1 MG tablet Take 1 mg by mouth every 6 (six) hours as needed for anxiety or sleep. 03/31/15   [provider]  pantoprazole (PROTONIX) 40 MG tablet Take 40 mg by mouth daily.     [provider]  polyethylene glycol-electrolytes (TRILYTE) 420 g solution Take 4,000 mLs by mouth as directed. 11/24/21   Rourk, Cristopher Estimable, MD  senna (SENOKOT) 8.6 MG TABS tablet Take 1 tablet (8.6 mg total) by mouth at bedtime as needed for mild constipation. May be needed as pain medication can constipate. 01/12/17   [name], MD  tadalafil (CIALIS) 20 MG tablet Take 1 tablet (20 mg total) by  mouth daily as needed for erectile dysfunction. Take 30-60 minutes prior to intercourse 11/19/21   Primus Bravo., MD  Testosterone Cypionate 200 MG/ML SOLN Inject 100 mg as directed every 14 (fourteen) days.    [provider]  traZODone (DESYREL) 50 MG tablet Take 100 mg by mouth at bedtime.     [provider]  zinc gluconate 50 MG tablet Take 50 mg by mouth 2 (two) times a week.    [provider]    Family History Family History  Problem Relation Age of Onset   Hypertension Mother    Colon cancer Neg Hx     Social History Social History   Tobacco Use   Smoking status: Former    Packs/day: 0.50    Years: 35.00    Pack years: 17.50    Types: Cigarettes   Smokeless tobacco: Never   Tobacco comments:    quit 02-2017  Vaping Use   Vaping Use: Never used  Substance Use Topics   Alcohol use: No    Comment: quit 2000 but relapse in 2016. no etoh since hospitalized 2016.    Drug use: No     Allergies   Patient has no known allergies.   Review of Systems Review of Systems PER HPI  Physical Exam Triage Vital Signs ED Triage Vitals  Enc Vitals Group     BP 01/15/22 1721 (!) 168/86     Pulse Rate 01/15/22 1721 98     Resp 01/15/22 1721 20     Temp 01/15/22 1721 97.9 F (36.6 C)     Temp Source 01/15/22 1721 Oral     SpO2 01/15/22 1721 93 %     Weight --      Height --      Head Circumference --      Peak Flow --      Pain Score 01/15/22 1719 0     Pain Loc --      Pain Edu? --      Excl. in [place]? --    No data found.  Updated Vital Signs BP (!) 168/86 (BP Location: Right Arm)   Pulse 98   Temp 97.9 F (36.6 C) (Oral)   Resp  20   SpO2 93%   Visual Acuity Right Eye Distance:   Left Eye Distance:   Bilateral Distance:    Right Eye Near:   Left Eye Near:    Bilateral Near:     Physical Exam Vitals and nursing note reviewed. Chaperone present: Lucas Mallow, CMA.  Constitutional:      Appearance: Normal appearance.   Abdominal:     General: Bowel sounds are normal.     Palpations: Abdomen is soft.  Genitourinary:    Penis: Uncircumcised. Erythema, discharge and swelling present. No tenderness or lesions.      Comments: Uncircumcised; erythema of glans. Thick-white discharge present. Neurological:     General: No focal deficit present.     Mental Status: He is alert and oriented to person, place, and time.  Psychiatric:        Mood and Affect: Mood normal.        Behavior: Behavior normal.     UC Treatments / Results  Labs (all labs ordered are listed, but only abnormal results are displayed) Labs Reviewed - No data to display  EKG   Radiology No results found.  Procedures Procedures (including critical care time)  Medications Ordered in UC Medications - No data to display  Initial Impression / Assessment and Plan / UC Course  I have reviewed the triage vital signs and the nursing notes.  Pertinent labs & imaging results that were available during my care of the patient were reviewed by me and considered in my medical decision making (see chart for details).  Patient is a 71 year old male who presents with itching and discharge around the head of his penis.  Symptoms started this morning.  Patient has a history of balanitis.  On exam, patient has erythema of the glans along with white thick discharge noted.  Symptoms are consistent with balanitis.  Patient was given a prescription for Diflucan.  Patient was also advised to continue using the nystatin powder he currently has.  Supportive care was recommended.  Patient advised to follow-up with urology if his symptoms do not improve. Final Clinical Impressions(s) / UC Diagnoses   Final diagnoses:  Candidal balanitis     Discharge Instructions      Take medication as prescribed.  Take 1 tablet today.  If you continue to have symptoms after 3 days, take the second tablet.  If you continue to have symptoms 6 days, take the final  tablet. Continue using the nystatin powder you currently have. Recommend eating more yogurt to help decrease your symptoms. Keep the area clean and dry. Follow-up with your urologist if your symptoms do not improve.      ED Prescriptions     Medication Sig Dispense Auth. Provider   fluconazole (DIFLUCAN) 150 MG tablet Take 1 tablet (150 mg total) by mouth daily. Take one tablet today, may repeat every 72 hours up to 2 additional doses for persistent symptoms. 3 tablet Gidget Quizhpi-Warren, Alda Lea, NP      PDMP not reviewed this encounter.   Tish Men, NP 01/15/22 1749

## 2022-01-28 ENCOUNTER — Other Ambulatory Visit (HOSPITAL_COMMUNITY): Payer: Self-pay | Admitting: Nurse Practitioner

## 2022-01-28 ENCOUNTER — Ambulatory Visit (HOSPITAL_COMMUNITY)
Admission: RE | Admit: 2022-01-28 | Discharge: 2022-01-28 | Disposition: A | Discharge status: Home / Self Care | Payer: Medicare Other | Source: Ambulatory Visit | Attending: Nurse Practitioner | Admitting: Nurse Practitioner

## 2022-01-28 DIAGNOSIS — M25551 Pain in right hip: Secondary | ICD-10-CM

## 2022-01-28 DIAGNOSIS — M25552 Pain in left hip: Secondary | ICD-10-CM | POA: Insufficient documentation

## 2022-02-02 ENCOUNTER — Telehealth: Payer: Self-pay | Admitting: Nurse Practitioner

## 2022-02-02 NOTE — Telephone Encounter (Signed)
.  Called patient to schedule appointment per 4/19 inbasket, patient is aware of date and time.

## 2022-02-07 ENCOUNTER — Encounter: Payer: Self-pay | Admitting: Emergency Medicine

## 2022-02-07 ENCOUNTER — Other Ambulatory Visit: Payer: Self-pay

## 2022-02-07 ENCOUNTER — Ambulatory Visit
Admission: EM | Admit: 2022-02-07 | Discharge: 2022-02-07 | Disposition: A | Discharge status: Home / Self Care | Payer: Medicare Other | Attending: Student | Admitting: Student

## 2022-02-07 DIAGNOSIS — N481 Balanitis: Secondary | ICD-10-CM | POA: Diagnosis not present

## 2022-02-07 MED ORDER — FLUCONAZOLE 150 MG PO TABS: 150.0000 mg | ORAL_TABLET | Freq: Every day | ORAL | 0 refills | Status: DC

## 2022-02-07 NOTE — Discharge Instructions (Addendum)
-  Fluconazole once every 3 days x3 doses  -Make sure to clean the penis and under the foreskin daily -Follow-up with PCP if symptoms worsen/persist

## 2022-02-07 NOTE — ED Provider Notes (Signed)
[facility]    CSN: [id number] Arrival date & time: 02/07/22  1104      History   Chief Complaint Chief Complaint  Patient presents with   Pruritis    HPI Colin Wagner is a 71 y.o. male presenting with concern for recurrent balanitis.  History head and neck cancer that is currently being treated by ENT.  He last presented to our urgent care for Candida balanitis on 01/15/2022, was managed with Diflucan x3 tabs, he states that symptoms improved after 2 tabs and he did not complete the course.  He states he has had white penile discharge under the foreskin again for the last few days.  He is feeling well otherwise, without abdominal pain, flank pain, fever/chills, dysuria, hematuria.  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)    left jaw part of jaw bone removed   Peptic ulcer disease 1999   Per medical reports, no H pylori   Pneumonia    Renal cancer, left ([place]) 2012   he tells me that he has been released, ?and that he is free of cancer, and never had it to begin with.    Right shoulder pain    Testicular hypofunction     Patient Active Problem List   Diagnosis Date Noted   GERD (gastroesophageal reflux disease)    IDA (iron deficiency anemia)    GI bleed 12/04/2019   Anemia    Rectal bleed 11/05/2019   Acute respiratory failure with hypoxia ([place]) 07/29/2019   Pneumonia due to COVID-19 virus 07/28/2019   AKI (acute kidney injury) ([place]) 07/27/2019   Hyperkalemia  07/27/2019   COVID-19 virus infection 07/27/2019   Urinary retention due to benign prostatic hyperplasia 03/01/2019   Attention to G-tube ([place]) 02/08/2018   UTI (urinary tract infection) 04/20/2017   UTI due to Klebsiella species 04/20/2017   Essential hypertension 04/20/2017   Chronic pancreatitis ([place]) 04/20/2017   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   Malnutrition of moderate degree 82/95/6213   Complicated UTI (urinary tract infection)    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   Rectal bleeding 09/28/2016   Hyponatremia 04/28/2015   Abdominal pain 04/27/2015   Acute pancreatitis 04/27/2015   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 [name], polyps   COLONOSCOPY  2005   Dr. [name], multiple diverticula   COLONOSCOPY  2008   Dr.  Jenkins, diverticulosis   COLONOSCOPY WITH PROPOFOL N/A 10/21/2016   Dr. [name]: Diverticulosis, two 5-7 mm polyps removed. path-tubular adenomas.  Next colonoscopy in 5 years.   COLONOSCOPY WITH PROPOFOL N/A 12/31/2021   Procedure: COLONOSCOPY WITH PROPOFOL;  Surgeon: [name], MD;  [facility];  Service: Endoscopy;  Laterality: N/A;  8:15am   ESOPHAGOGASTRODUODENOSCOPY     Multiple EGDs. 1999 EGD showed gastric ulcers, no H pylori and benign biopsies performed by Dr. [name]. 2001 gastric ulcer healed. Last EGD 2005 had gastritis.   ESOPHAGOGASTRODUODENOSCOPY (EGD) WITH PROPOFOL N/A 12/31/2021   Procedure: ESOPHAGOGASTRODUODENOSCOPY (EGD) WITH PROPOFOL;   Surgeon: [name], MD;  [facility];  Service: Endoscopy;  Laterality: N/A;   IR GASTROSTOMY TUBE REMOVAL  02/08/2020   IR REPLACE G-TUBE SIMPLE WO FLUORO  03/20/2019   IR REPLACE G-TUBE SIMPLE WO FLUORO  09/21/2019   IR REPLC GASTRO/COLONIC TUBE PERCUT W/FLUORO  05/16/2018   Left partial mandibulectomy, Scapular free flap reconstruction, selective neck dissection, tracheotomy, and resection of intraoral palate cancer. Left 03/01/2017   [facility]   LUNG BIOPSY     MOUTH SURGERY     PARTIAL NEPHRECTOMY Left 2012   POLYPECTOMY  10/21/2016   Procedure: POLYPECTOMY;  Surgeon: [name], MD;  [facility];  Service: Endoscopy;;  hepatic flexure x2   POLYPECTOMY  12/31/2021   Procedure: POLYPECTOMY INTESTINAL;  Surgeon: [name], MD;  [facility];  Service: Endoscopy;;   SURGERY OF LIP  06/2019   lip revision   TRANSURETHRAL RESECTION OF PROSTATE N/A 03/01/2019   Procedure: TRANSURETHRAL RESECTION OF THE PROSTATE (TURP)WITH CYSTOSCOPY;  Surgeon: Raynelle Bring, MD;  [facility];  Service: Urology;  Laterality: N/A;       Home Medications    Prior to Admission medications   Medication Sig Start Date End Date Taking? Authorizing Provider  acetaminophen (TYLENOL) 500 MG tablet Take 1,000 mg by mouth every 6 (six) hours as needed for mild pain or moderate pain.    [provider]  amLODipine (NORVASC) 5 MG tablet Take 5 mg by mouth daily. 12/02/20   [provider]  aspirin EC 81 MG tablet Take 81 mg by mouth daily.    [provider]  atorvastatin (LIPITOR) 40 MG tablet Take 40 mg by mouth daily at 6 PM.  02/14/17   [provider]  cholecalciferol (VITAMIN D) 25 MCG (1000 UNIT) tablet Take 1,000 Units by mouth daily.    [provider]  clopidogrel (PLAVIX) 75 MG tablet Take 75 mg by mouth daily. 05/27/19   [provider]  cyclobenzaprine (FLEXERIL) 10 MG tablet  Take 10 mg by mouth 4 (four) times daily as needed for muscle spasms. 09/06/18   [provider]  ferrous sulfate 325 (65 FE) MG tablet Take 325 mg by mouth daily with breakfast.    [provider]  fluconazole (DIFLUCAN) 150 MG tablet Take 1 tablet (150 mg total) by mouth daily. Take one tablet today, may repeat every 72 hours up to 2 additional doses for persistent symptoms. 02/07/22   Hazel Sams, PA-C  fluticasone (FLONASE) 50 MCG/ACT nasal spray Place 2 sprays into both nostrils daily.    [provider]  glipiZIDE (GLUCOTROL XL) 2.5 MG 24 hr tablet Take 2.5 mg by mouth every morning. 08/11/18   [provider]  hydrochlorothiazide (HYDRODIURIL) 12.5 MG tablet Take 12.5 mg by mouth daily. 04/24/19  [provider]  HYDROcodone-acetaminophen (NORCO) 7.5-325 MG tablet Take 1 tablet by mouth every 6 (six) hours as needed (pain). Patient taking differently: Take 1 tablet by mouth every 6 (six) hours as needed for moderate pain. 01/12/17   [name], MD  LEVEMIR FLEXTOUCH 100 UNIT/ML Pen Inject 40 Units into the skin at bedtime. X 6 more nights, then return back to 22 units at bedtime Patient taking differently: Inject 24 Units into the skin at bedtime. 07/31/19   Orson Eva, MD  levocetirizine (XYZAL) 5 MG tablet Take 5 mg by mouth every evening.    [provider]  LORazepam (ATIVAN) 1 MG tablet Take 1 mg by mouth every 6 (six) hours as needed for anxiety or sleep. 03/31/15   [provider]  pantoprazole (PROTONIX) 40 MG tablet Take 40 mg by mouth daily.     [provider]  polyethylene glycol-electrolytes (TRILYTE) 420 g solution Take 4,000 mLs by mouth as directed. 11/24/21   Rourk, Cristopher Estimable, MD  senna (SENOKOT) 8.6 MG TABS tablet Take 1 tablet (8.6 mg total) by mouth at bedtime as needed for mild constipation. May be needed as pain medication can constipate. 01/12/17   [name], MD  tadalafil (CIALIS) 20 MG tablet Take 1  tablet (20 mg total) by mouth daily as needed for erectile dysfunction. Take 30-60 minutes prior to intercourse 11/19/21   Primus Bravo., MD  Testosterone Cypionate 200 MG/ML SOLN Inject 100 mg as directed every 14 (fourteen) days.    [provider]  traZODone (DESYREL) 50 MG tablet Take 100 mg by mouth at bedtime.     [provider]  zinc gluconate 50 MG tablet Take 50 mg by mouth 2 (two) times a week.    [provider]    Family History Family History  Problem Relation Age of Onset   Hypertension Mother    Colon cancer Neg Hx     Social History Social History   Tobacco Use   Smoking status: Former    Packs/day: 0.50    Years: 35.00    Total pack years: 17.50    Types: Cigarettes   Smokeless tobacco: Never   Tobacco comments:    quit 02-2017  Vaping Use   Vaping Use: Never used  Substance Use Topics   Alcohol use: No    Comment: quit 2000 but relapse in 2016. no etoh since hospitalized 2016.    Drug use: No     Allergies   Patient has no known allergies.   Review of Systems Review of Systems  Genitourinary: Negative.   All other systems reviewed and are negative.    Physical Exam Triage Vital Signs ED Triage Vitals  Enc Vitals Group     BP 02/07/22 1210 (!) 152/76     Pulse Rate 02/07/22 1210 93     Resp 02/07/22 1210 20     Temp 02/07/22 1210 (!) 97.5 F (36.4 C)     Temp Source 02/07/22 1210 Oral     SpO2 02/07/22 1210 92 %     Weight --      Height --      Head Circumference --      Peak Flow --      Pain Score 02/07/22 1212 0     Pain Loc --      Pain Edu? --      Excl. in [place]? --    No data found.  Updated Vital Signs BP Marland Kitchen)  152/76 (BP Location: Right Arm)   Pulse 93   Temp (!) 97.5 F (36.4 C) (Oral)   Resp 20   SpO2 92%   Visual Acuity Right Eye Distance:   Left Eye Distance:   Bilateral Distance:    Right Eye Near:   Left Eye Near:    Bilateral Near:     Physical Exam Vitals reviewed.   Constitutional:      General: He is not in acute distress.    Appearance: Normal appearance. He is not ill-appearing.  HENT:     Head: Normocephalic and atraumatic.     Mouth/Throat:     Mouth: Mucous membranes are moist.     Comments: Moist mucous membranes Eyes:     Extraocular Movements: Extraocular movements intact.     Pupils: Pupils are equal, round, and reactive to light.  Cardiovascular:     Rate and Rhythm: Normal rate and regular rhythm.     Heart sounds: Normal heart sounds.  Pulmonary:     Effort: Pulmonary effort is normal.     Breath sounds: Normal breath sounds. No wheezing, rhonchi or rales.  Abdominal:     General: Bowel sounds are normal. There is no distension.     Palpations: Abdomen is soft. There is no mass.     Tenderness: There is no abdominal tenderness. There is no right CVA tenderness, left CVA tenderness, guarding or rebound.  Genitourinary:    Comments: declined Skin:    General: Skin is warm.     Capillary Refill: Capillary refill takes less than 2 seconds.     Comments: Good skin turgor  Neurological:     General: No focal deficit present.     Mental Status: He is alert and oriented to person, place, and time.  Psychiatric:        Mood and Affect: Mood normal.        Behavior: Behavior normal.      UC Treatments / Results  Labs (all labs ordered are listed, but only abnormal results are displayed) Labs Reviewed - No data to display  EKG   Radiology No results found.  Procedures Procedures (including critical care time)  Medications Ordered in UC Medications - No data to display  Initial Impression / Assessment and Plan / UC Course  I have reviewed the triage vital signs and the nursing notes.  Pertinent labs & imaging results that were available during my care of the patient were reviewed by me and considered in my medical decision making (see chart for details).     This patient is a very pleasant 71 y.o. year old male  presenting with recurrent balanitis. Afebrile, nontachycardic, no reproducible abd pain or CVAT. We last managed this with diflucan x2 tabs on 5/26, symptoms improved but then returned. Will manage with diflucan x3 tabs today, he states he will complete course of medication. Discussed proper hygiene, ie cleaning under the foreskin daily. ED return precautions discussed. Patient verbalizes understanding and agreement.   Final Clinical Impressions(s) / UC Diagnoses   Final diagnoses:  Balanitis     Discharge Instructions      -Fluconazole once every 3 days x3 doses  -Make sure to clean the penis and under the foreskin daily -Follow-up with PCP if symptoms worsen/persist    ED Prescriptions     Medication Sig Dispense Auth. Provider   fluconazole (DIFLUCAN) 150 MG tablet Take 1 tablet (150 mg total) by mouth daily. Take one tablet today, may repeat every  72 hours up to 2 additional doses for persistent symptoms. 3 tablet Hazel Sams, PA-C      PDMP not reviewed this encounter.   Hazel Sams, PA-C 02/07/22 1404

## 2022-02-07 NOTE — ED Triage Notes (Signed)
Pt reports history of similar and reports thought it was getting better but reports symptoms returned yesterday and needs fluconazole px. Pt denies any difficulty voiding or fevers.

## 2022-03-29 ENCOUNTER — Ambulatory Visit (HOSPITAL_COMMUNITY)
Admission: RE | Admit: 2022-03-29 | Discharge: 2022-03-29 | Disposition: A | Discharge status: Home / Self Care | Payer: Medicare Other | Source: Ambulatory Visit | Attending: Nurse Practitioner | Admitting: Nurse Practitioner

## 2022-03-29 ENCOUNTER — Other Ambulatory Visit (HOSPITAL_COMMUNITY): Payer: Self-pay | Admitting: Nurse Practitioner

## 2022-03-29 DIAGNOSIS — R053 Chronic cough: Secondary | ICD-10-CM | POA: Insufficient documentation

## 2022-03-30 ENCOUNTER — Other Ambulatory Visit (HOSPITAL_COMMUNITY): Payer: Self-pay | Admitting: Family Medicine

## 2022-03-30 ENCOUNTER — Other Ambulatory Visit: Payer: Self-pay | Admitting: Family Medicine

## 2022-03-30 ENCOUNTER — Other Ambulatory Visit: Payer: Self-pay | Admitting: Obstetrics and Gynecology

## 2022-03-30 ENCOUNTER — Ambulatory Visit (HOSPITAL_COMMUNITY)
Admission: RE | Admit: 2022-03-30 | Discharge: 2022-03-30 | Disposition: A | Discharge status: Home / Self Care | Payer: Medicare Other | Source: Ambulatory Visit | Attending: Family Medicine | Admitting: Family Medicine

## 2022-03-30 DIAGNOSIS — R053 Chronic cough: Secondary | ICD-10-CM | POA: Diagnosis present

## 2022-04-06 ENCOUNTER — Ambulatory Visit (INDEPENDENT_AMBULATORY_CARE_PROVIDER_SITE_OTHER): Payer: Medicare Other | Admitting: Internal Medicine

## 2022-04-06 ENCOUNTER — Ambulatory Visit: Payer: Medicaid Other | Admitting: Gastroenterology

## 2022-04-06 ENCOUNTER — Encounter: Payer: Self-pay | Admitting: Internal Medicine

## 2022-04-06 ENCOUNTER — Other Ambulatory Visit: Payer: Self-pay

## 2022-04-06 VITALS — BP 140/65 | HR 89 | Temp 97.5°F | Ht 71.0 in | Wt 182.8 lb

## 2022-04-06 DIAGNOSIS — K219 Gastro-esophageal reflux disease without esophagitis: Secondary | ICD-10-CM

## 2022-04-06 DIAGNOSIS — R9389 Abnormal findings on diagnostic imaging of other specified body structures: Secondary | ICD-10-CM

## 2022-04-06 DIAGNOSIS — R1310 Dysphagia, unspecified: Secondary | ICD-10-CM | POA: Diagnosis not present

## 2022-04-06 MED ORDER — PANTOPRAZOLE SODIUM 40 MG PO TBEC: 40.0000 mg | DELAYED_RELEASE_TABLET | Freq: Two times a day (BID) | ORAL | 3 refills | Status: DC

## 2022-04-06 NOTE — Progress Notes (Unsigned)
Primary Care Physician:  [name], MD Primary Gastroenterologist:  Dr. [name]  Pre-Procedure History & Physical: HPI:  Colin Wagner is a 71 y.o. male here for follow-up of dysphagia in setting of head neck cancer.  Upper esophageal sphincter/cricopharyngeal bar dilated with a balloon earlier this year.  Dilation has been associate with marked improvement in his ability to swallow "most anything" he is very happy.  He does have nocturnal reflux symptoms about 3 times weekly.  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    Right shoulder pain    Testicular hypofunction     Past Surgical History:  Procedure Laterality Date   BALLOON DILATION  12/31/2021   Procedure: BALLOON DILATION;  Surgeon: [name], MD;  [facility];  Service: Endoscopy;;   CAROTID STENT  06/2018   COLONOSCOPY  2003   Dr. [name], polyps   COLONOSCOPY  2005   Dr. [name], multiple diverticula   COLONOSCOPY  2008   Dr. [name], diverticulosis   COLONOSCOPY WITH PROPOFOL N/A 10/21/2016   Dr. [name]: Diverticulosis, two 5-7 mm polyps removed. path-tubular adenomas.  Next colonoscopy in 5 years.   COLONOSCOPY WITH PROPOFOL N/A 12/31/2021   Procedure:  COLONOSCOPY WITH PROPOFOL;  Surgeon: [name], MD;  [facility];  Service: Endoscopy;  Laterality: N/A;  8:15am   ESOPHAGOGASTRODUODENOSCOPY     Multiple EGDs. 1999 EGD showed gastric ulcers, no H pylori and benign biopsies performed by Dr. [name]. 2001 gastric ulcer healed. Last EGD 2005 had gastritis.   ESOPHAGOGASTRODUODENOSCOPY (EGD) WITH PROPOFOL N/A 12/31/2021   Procedure: ESOPHAGOGASTRODUODENOSCOPY (EGD) WITH PROPOFOL;  Surgeon: [name], MD;  [facility];  Service: Endoscopy;  Laterality: N/A;   IR GASTROSTOMY TUBE REMOVAL  02/08/2020   IR REPLACE G-TUBE SIMPLE WO FLUORO  03/20/2019   IR REPLACE G-TUBE SIMPLE WO FLUORO  09/21/2019   IR REPLC GASTRO/COLONIC TUBE PERCUT W/FLUORO  05/16/2018   Left partial mandibulectomy, Scapular free flap reconstruction, selective neck dissection, tracheotomy, and resection of intraoral palate cancer. Left 03/01/2017   [facility]   LUNG BIOPSY     MOUTH SURGERY     PARTIAL NEPHRECTOMY Left 2012   POLYPECTOMY  10/21/2016   Procedure: POLYPECTOMY;  Surgeon: [name], MD;  [facility];  Service: Endoscopy;;  hepatic flexure x2   POLYPECTOMY  12/31/2021   Procedure: POLYPECTOMY INTESTINAL;  Surgeon: [name], MD;  [facility];  Service: Endoscopy;;  SURGERY OF LIP  06/2019   lip revision   TRANSURETHRAL RESECTION OF PROSTATE N/A 03/01/2019   Procedure: TRANSURETHRAL RESECTION OF THE PROSTATE (TURP)WITH CYSTOSCOPY;  Surgeon: Raynelle Bring, MD;  [facility];  Service: Urology;  Laterality: N/A;    Prior to Admission medications   Medication Sig Start Date End Date Taking? Authorizing Provider  acetaminophen (TYLENOL) 500 MG tablet Take 1,000 mg by mouth every 6 (six) hours as needed for mild pain or moderate pain.   Yes [provider]  albuterol (VENTOLIN HFA) 108 (90 Base) MCG/ACT inhaler Inhale 2 puffs into the lungs every 4 (four) hours.  03/29/22  Yes [provider]  amLODipine (NORVASC) 5 MG tablet Take 5 mg by mouth daily. 12/02/20  Yes [provider]  aspirin EC 81 MG tablet Take 81 mg by mouth daily.   Yes [provider]  atorvastatin (LIPITOR) 40 MG tablet Take 40 mg by mouth daily at 6 PM.  02/14/17  Yes [provider]  cholecalciferol (VITAMIN D) 25 MCG (1000 UNIT) tablet Take 1,000 Units by mouth daily.   Yes [provider]  clopidogrel (PLAVIX) 75 MG tablet Take 75 mg by mouth daily. 05/27/19  Yes [provider]  cyclobenzaprine (FLEXERIL) 10 MG tablet Take 10 mg by mouth 4 (four) times daily as needed for muscle spasms. 09/06/18  Yes [provider]  ferrous sulfate 325 (65 FE) MG tablet Take 325 mg by mouth daily with breakfast.   Yes [provider]  fluconazole (DIFLUCAN) 150 MG tablet Take 1 tablet (150 mg total) by mouth daily. Take one tablet today, may repeat every 72 hours up to 2 additional doses for persistent symptoms. 02/07/22  Yes Hazel Sams, PA-C  fluticasone (FLONASE) 50 MCG/ACT nasal spray Place 2 sprays into both nostrils daily.   Yes [provider]  glipiZIDE (GLUCOTROL XL) 2.5 MG 24 hr tablet Take 2.5 mg by mouth every morning. 08/11/18  Yes [provider]  hydrochlorothiazide (HYDRODIURIL) 12.5 MG tablet Take 12.5 mg by mouth daily. 04/24/19  Yes [provider]  HYDROcodone-acetaminophen (NORCO) 7.5-325 MG tablet Take 1 tablet by mouth every 6 (six) hours as needed (pain). Patient taking differently: Take 1 tablet by mouth every 6 (six) hours as needed for moderate pain. 01/12/17  Yes [name], MD  LEVEMIR FLEXTOUCH 100 UNIT/ML Pen Inject 40 Units into the skin at bedtime. X 6 more nights, then return back to 22 units at bedtime Patient taking differently: Inject 24 Units into the skin at bedtime. 07/31/19  Yes Tat, Shanon Brow, MD  levocetirizine (XYZAL) 5 MG tablet Take 5 mg by mouth every evening.    Yes [provider]  LORazepam (ATIVAN) 1 MG tablet Take 1 mg by mouth every 6 (six) hours as needed for anxiety or sleep. 03/31/15  Yes [provider]  pantoprazole (PROTONIX) 40 MG tablet Take 40 mg by mouth daily.    Yes [provider]  polyethylene glycol-electrolytes (TRILYTE) 420 g solution Take 4,000 mLs by mouth as directed. 11/24/21  Yes Omaira Mellen, Cristopher Estimable, MD  senna (SENOKOT) 8.6 MG TABS tablet Take 1 tablet (8.6 mg total) by mouth at bedtime as needed for mild constipation. May be needed as pain medication can constipate. 01/12/17  Yes [name], MD  tadalafil (CIALIS) 20 MG tablet Take 1 tablet (20 mg total) by mouth daily as needed for erectile dysfunction. Take 30-60 minutes prior to intercourse 11/19/21  Yes Stoneking, Reece Leader., MD  Testosterone Cypionate 200 MG/ML SOLN Inject 100 mg as directed every 14 (fourteen) days.   Yes [provider]  traZODone (DESYREL) 50 MG tablet Take 100 mg by mouth at bedtime.    Yes [provider]  zinc gluconate 50 MG tablet Take 50 mg by mouth 2 (two) times a week.   Yes [provider]    Allergies as of 04/06/2022   (No Known Allergies)    Family History  Problem Relation Age of Onset   Hypertension Mother    Colon cancer Neg Hx     Social History   Socioeconomic History   Marital status: Divorced    Spouse name: Not on file   Number of children: 0   Years of education: Not on file   Highest education level: Not on file  Occupational History   Not on file  Tobacco Use   Smoking status: Former    Packs/day: 0.50    Years: 35.00    Total pack years: 17.50    Types: Cigarettes   Smokeless tobacco: Never   Tobacco comments:    quit 02-2017  Vaping Use   Vaping Use: Never used  Substance and Sexual Activity   Alcohol use: No    Comment: quit 2000 but relapse in 2016. no etoh since hospitalized 2016.    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   Patient with a history of smoking half pack a day for 35+ years. Patient currently only smoking a few cigarettes per day.   The patient has history of alcohol abuse. Patient quit in 2000 and then relapsed in 2016. Patient has not had any alcohol since 2016.   Social Determinants of Health   Financial Resource Strain: Low Risk  (07/27/2019)   Overall Financial Resource Strain (CARDIA)    Difficulty of Paying Living Expenses: Not hard at all  Food Insecurity: No Food Insecurity (07/27/2019)   Hunger Vital Sign    Worried About Running Out of Food in the Last Year: Never true    Ran Out of Food in the Last Year: Never true  Transportation Needs: No Transportation Needs (09/08/2018)   PRAPARE - Hydrologist (Medical): No    Lack of Transportation (Non-Medical): No  Physical Activity: Inactive (07/27/2019)   Exercise Vital Sign    Days of Exercise per Week: 0 days    Minutes of Exercise per Session: 0 min  Stress: No Stress Concern Present (07/27/2019)   [place]    Feeling of Stress : Not at all  Social Connections: Unknown (07/27/2019)   Social Connection and Isolation Panel [NHANES]    Frequency of Communication with Friends and Family: More than three times a week    Frequency of Social Gatherings with Friends and Family: More than three times a week    Attends Religious Services: More than 4 times per year    Active Member of [employer] or Organizations: Yes    Attends Archivist Meetings: More than 4 times per year    Marital Status: Not on file  Intimate Partner Violence: Not At Risk (04/14/2018)   Humiliation, Afraid, Rape, and Kick questionnaire    Fear of Current or Ex-Partner: No    Emotionally Abused: No  Physically Abused: No    Sexually Abused: No    Review of  Systems: See HPI, otherwise negative ROS  Physical Exam: BP (!) 140/65 (BP Location: Right Arm, Patient Position: Sitting, Cuff Size: Normal)   Pulse 89   Temp (!) 97.5 F (36.4 C) (Temporal)   Ht 5\' 11"  (1.803 m)   Wt 182 lb 12.8 oz (82.9 kg)   SpO2 92%   BMI 25.50 kg/m  General:   Alert,  Well-developed, well-nourished, pleasant and cooperative in NAD Neck:  Supple; no masses or thyromegaly. No significant cervical adenopathy. Lungs:  Clear throughout to auscultation.   No wheezes, crackles, or rhonchi. No acute distress. Heart:  Regular rate and rhythm; no murmurs, clicks, rubs,  or gallops. Abdomen: Non-distended, normal bowel sounds.  Soft and nontender without appreciable mass or hepatosplenomegaly.  Pulses:  Normal pulses noted. Extremities:  Without clubbing or edema.  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  Swallowing much better.  GERD suboptimally controlled in the evening on once daily PPI therapy.  Overall patient is very happy with his progress.  We reviewed the multipronged approach in the management of gastroesophageal reflux disease.  Recommendations:  GERD information provided  No late-night meals.  No intake of food 3 hours before lying down.  Elevate head of your bed.  Continue Protonix 40 mg 30 minutes before breakfast each day; for the next 3 months, will increase with a second dose 30 minutes before supper (dispense 60 with 3 refills.  You may or may not need your esophagus stretched at some point in the future again.  For now, it sounds like you are swallowing much better  This visit here in 3 months    Notice: This dictation was prepared with Dragon dictation along with smaller phrase technology. Any transcriptional errors that result from this process are unintentional and may not be corrected upon review.

## 2022-04-06 NOTE — Patient Instructions (Signed)
It was good to see you again today!  GERD information provided  No late-night meals.  No intake of food 3 hours before lying down.  Elevate head of your bed.  Continue Protonix 40 mg 30 minutes before breakfast each day; for the next 3 months, will increase with a second dose 30 minutes before supper (dispense 60 with 3 refills.  You may or may not need your esophagus stretched at some point in the future again.  For now, it sounds like you are swallowing much better  This visit here in 3 months

## 2022-04-07 ENCOUNTER — Telehealth: Payer: Self-pay | Admitting: *Deleted

## 2022-04-07 NOTE — Telephone Encounter (Signed)
Called patient to inform of Pet Scan for 04-12-22- arrival time- 2:30 pm @ [facility], patient to have water only- 6 hrs. prior to test, spoke with patient's friend Holland Commons and she is aware of this test

## 2022-04-12 ENCOUNTER — Ambulatory Visit (HOSPITAL_COMMUNITY)
Admission: RE | Admit: 2022-04-12 | Discharge: 2022-04-12 | Disposition: A | Discharge status: Home / Self Care | Payer: Medicare Other | Source: Ambulatory Visit | Attending: Radiation Oncology | Admitting: Radiation Oncology

## 2022-04-12 DIAGNOSIS — R918 Other nonspecific abnormal finding of lung field: Secondary | ICD-10-CM | POA: Insufficient documentation

## 2022-04-12 DIAGNOSIS — J9811 Atelectasis: Secondary | ICD-10-CM | POA: Diagnosis not present

## 2022-04-12 DIAGNOSIS — Z85818 Personal history of malignant neoplasm of other sites of lip, oral cavity, and pharynx: Secondary | ICD-10-CM | POA: Insufficient documentation

## 2022-04-12 DIAGNOSIS — I700 Atherosclerosis of aorta: Secondary | ICD-10-CM | POA: Diagnosis not present

## 2022-04-12 DIAGNOSIS — R9389 Abnormal findings on diagnostic imaging of other specified body structures: Secondary | ICD-10-CM | POA: Insufficient documentation

## 2022-04-12 LAB — GLUCOSE, CAPILLARY: Glucose-Capillary: 127 mg/dL — ABNORMAL HIGH (ref 70–99)

## 2022-04-12 MED ORDER — FLUDEOXYGLUCOSE F - 18 (FDG) INJECTION
9.0000 | Freq: Once | INTRAVENOUS | Status: AC | PRN
  Administered 2022-04-12: 9.08 via INTRAVENOUS

## 2022-04-21 ENCOUNTER — Ambulatory Visit (INDEPENDENT_AMBULATORY_CARE_PROVIDER_SITE_OTHER): Payer: Medicare Other | Admitting: Internal Medicine

## 2022-04-21 ENCOUNTER — Encounter: Payer: Self-pay | Admitting: Internal Medicine

## 2022-04-21 ENCOUNTER — Encounter: Payer: Self-pay | Admitting: Pulmonary Disease

## 2022-04-21 ENCOUNTER — Encounter: Payer: Self-pay | Admitting: Student

## 2022-04-21 ENCOUNTER — Telehealth: Payer: Self-pay | Admitting: Student

## 2022-04-21 VITALS — BP 126/76 | HR 89 | Temp 98.2°F | Ht 71.0 in | Wt 181.8 lb

## 2022-04-21 DIAGNOSIS — Z85819 Personal history of malignant neoplasm of unspecified site of lip, oral cavity, and pharynx: Secondary | ICD-10-CM | POA: Diagnosis not present

## 2022-04-21 DIAGNOSIS — Z87891 Personal history of nicotine dependence: Secondary | ICD-10-CM | POA: Diagnosis not present

## 2022-04-21 DIAGNOSIS — R918 Other nonspecific abnormal finding of lung field: Secondary | ICD-10-CM

## 2022-04-21 NOTE — Telephone Encounter (Signed)
Raven was helping me with bronchs and she called OR scheduling and got a different scheduler who was able to give her 9/7 at 7:30.  Pt has been taken care of.

## 2022-04-21 NOTE — Progress Notes (Signed)
Colin Wagner    [id number]    1950/11/23  Primary Care Physician:Knowlton, [name], MD  Referring Physician: [name], MD [place],  [place] [address] Reason for Consultation: lung mass Date of Consultation: 04/21/2022  Chief complaint:   Chief Complaint  Patient presents with   Consult    He was told that a recent CXR that he has a collapsed lung and coughs up blood clots at times.     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 He is here with significant other.  Was having coughing and shortness of breath earlier this month which led to chest xray and CT Chest with PET scan which shows pet avid lung mas of the RUL.   He is having intermittent hemoptysis - with clots of blood usually 2-3 times/week.   He eats a modified diet and has had esophageal dilation which helps him tolerate food. Significant other says decreased appetite and unintentional weight loss. No fevers or chills.    Social History   Occupational History   Not on file  Tobacco Use   Smoking status: Former    Packs/day: 0.50    Years: 35.00    Total pack years: 17.50    Types: Cigarettes    Quit date: 03/01/2017    Years since quitting: 5.1   Smokeless tobacco: Never   Tobacco comments:    quit 02-2017  Vaping Use   Vaping Use: Never used  Substance and Sexual Activity   Alcohol use: No    Comment: quit 2000 but relapse in 2016. no etoh since hospitalized 2016.    Drug use: No   Sexual activity: Not Currently    Relevant family history:  Family History  Problem Relation Age of Onset   Hypertension Mother    Colon cancer Neg Hx     Past Medical History:  Diagnosis Date   Alcohol abuse    Alcoholic pancreatitis 1749   admission   Chronic back pain    Chronic pancreatitis ([place])    based on ct findings 2016   COPD (chronic obstructive pulmonary disease) ([place])    Diabetes mellitus     type 2   Diverticulosis    Gastritis    GERD (gastroesophageal reflux disease)    Headache    History of radiation therapy 05/12/17- 06/22/17   Left cheek and bilateral neck/ 60 Gy in 30 fractions to gross disease   Hyperlipidemia    Hypertension    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    Right shoulder pain    Testicular hypofunction     Past Surgical History:  Procedure Laterality Date   BALLOON DILATION  12/31/2021   Procedure: BALLOON DILATION;  Surgeon: [name], MD;  [facility];  Service: Endoscopy;;   CAROTID STENT  06/2018   COLONOSCOPY  2003   Dr. [name], polyps   COLONOSCOPY  2005   Dr. [name], multiple diverticula   COLONOSCOPY  2008   Dr. [name], diverticulosis   COLONOSCOPY WITH PROPOFOL  N/A 10/21/2016   Dr. [name]: Diverticulosis, two 5-7 mm polyps removed. path-tubular adenomas.  Next colonoscopy in 5 years.   COLONOSCOPY WITH PROPOFOL N/A 12/31/2021   Procedure: COLONOSCOPY WITH PROPOFOL;  Surgeon: [name], MD;  [facility];  Service: Endoscopy;  Laterality: N/A;  8:15am   ESOPHAGOGASTRODUODENOSCOPY     Multiple EGDs. 1999 EGD showed gastric ulcers, no H pylori and benign biopsies performed by Dr. [name]. 2001 gastric ulcer healed. Last EGD 2005 had gastritis.   ESOPHAGOGASTRODUODENOSCOPY (EGD) WITH PROPOFOL N/A 12/31/2021   Procedure: ESOPHAGOGASTRODUODENOSCOPY (EGD) WITH PROPOFOL;  Surgeon: [name], MD;  [facility];  Service: Endoscopy;  Laterality: N/A;   IR GASTROSTOMY TUBE REMOVAL  02/08/2020   IR REPLACE G-TUBE SIMPLE WO FLUORO  03/20/2019   IR REPLACE G-TUBE SIMPLE WO FLUORO  09/21/2019   IR REPLC GASTRO/COLONIC TUBE PERCUT W/FLUORO  05/16/2018   Left partial  mandibulectomy, Scapular free flap reconstruction, selective neck dissection, tracheotomy, and resection of intraoral palate cancer. Left 03/01/2017   [facility]   LUNG BIOPSY     MOUTH SURGERY     PARTIAL NEPHRECTOMY Left 2012   POLYPECTOMY  10/21/2016   Procedure: POLYPECTOMY;  Surgeon: [name], MD;  [facility];  Service: Endoscopy;;  hepatic flexure x2   POLYPECTOMY  12/31/2021   Procedure: POLYPECTOMY INTESTINAL;  Surgeon: [name], MD;  [facility];  Service: Endoscopy;;   SURGERY OF LIP  06/2019   lip revision   TRANSURETHRAL RESECTION OF PROSTATE N/A 03/01/2019   Procedure: TRANSURETHRAL RESECTION OF THE PROSTATE (TURP)WITH CYSTOSCOPY;  Surgeon: Raynelle Bring, MD;  [facility];  Service: Urology;  Laterality: N/A;     Physical Exam: Blood pressure 126/76, pulse 89, temperature 98.2 F (36.8 C), temperature source Oral, height 5\' 11"  (1.803 m), weight 181 lb 12.8 oz (82.5 kg), SpO2 98 %. Gen:      No acute distress ENT:  s/p mandibular resection and reconstruction, mucus membranes moist Lungs:    No increased respiratory effort, symmetric chest wall excursion, diminished CV:         Regular rate and rhythm; no murmurs, rubs, or gallops.  No pedal edema Abd:      + bowel sounds; soft, non-tender; no distension MSK: no acute synovitis of DIP or PIP joints, no mechanics hands.  Skin:      Warm and dry; no rashes Neuro: dysarthric Psych: alert and oriented x3, normal mood and affect   Data Reviewed/Medical Decision Making:  Independent interpretation of tests: Imaging:  Review of patient's PET CT  images revealed RUL mass probable endobronchial lesion with associated mass and atelectasis. This is hypermetabolic. The patient's images have been independently reviewed by me.    PFTs:  Labs:  Lab Results  Component Value Date   WBC 7.2 12/29/2021   HGB 13.7 12/29/2021   HCT 43.1 12/29/2021   MCV 79.8 (L)  12/29/2021   PLT 294 12/29/2021   Lab Results  Component Value Date   NA 135 12/29/2021   K 4.2 12/29/2021   CL 102 12/29/2021   CO2 24 12/29/2021      Immunization status:  Immunization History  Administered Date(s) Administered   Moderna Sars-Covid-2 Vaccination 10/19/2019, 11/16/2019   Pneumococcal Polysaccharide-23 04/21/2017     I reviewed prior external note(s) from ENT, oncology, rad onc, pCP  I reviewed the result(s) of the labs and imaging as noted above.  I have ordered PFT  Discussion of management or test interpretation with another colleague.   Assessment:  Right upper lobe lung mass concerning for primary lung malignancy History of oropharyngeal cancer s/p extensive facial resection and reconstruction in 2018 and radiation History of tobacco use disorder, quit 2018  Plan/Recommendations:  We discussed disease management and progression at length today. We have discussed the risks and benefits of bronchoscopy for diagnosis of probable new lung cancer and he is agreeable to proceed. Plan is to proceed with bronchoscopy with EBUS and endobronchial lesion biopsy. I will try to set this up with my schedule otherwise will see if one of my colleagues can add him on. Unfortunately he can't do this Friday because he is on aspirin and plavix. Will need this held 5 days prior to procedure.   Will obtain PFTs as well.    Return to Care: Return in about 4 weeks (around 05/19/2022).  Lenice Llamas, MD [facility] [place]  CC: [name], MD

## 2022-04-21 NOTE — H&P (View-Only) (Signed)
Colin Wagner    [id number]    March 26, 1951  Primary Care Physician:Knowlton, [name], MD  Referring Physician: [name], MD [place],  [place] [address] Reason for Consultation: lung mass Date of Consultation: 04/21/2022  Chief complaint:   Chief Complaint  Patient presents with   Consult    He was told that a recent CXR that he has a collapsed lung and coughs up blood clots at times.     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 He is here with significant other.  Was having coughing and shortness of breath earlier this month which led to chest xray and CT Chest with PET scan which shows pet avid lung mas of the RUL.   He is having intermittent hemoptysis - with clots of blood usually 2-3 times/week.   He eats a modified diet and has had esophageal dilation which helps him tolerate food. Significant other says decreased appetite and unintentional weight loss. No fevers or chills.    Social History   Occupational History   Not on file  Tobacco Use   Smoking status: Former    Packs/day: 0.50    Years: 35.00    Total pack years: 17.50    Types: Cigarettes    Quit date: 03/01/2017    Years since quitting: 5.1   Smokeless tobacco: Never   Tobacco comments:    quit 02-2017  Vaping Use   Vaping Use: Never used  Substance and Sexual Activity   Alcohol use: No    Comment: quit 2000 but relapse in 2016. no etoh since hospitalized 2016.    Drug use: No   Sexual activity: Not Currently    Relevant family history:  Family History  Problem Relation Age of Onset   Hypertension Mother    Colon cancer Neg Hx     Past Medical History:  Diagnosis Date   Alcohol abuse    Alcoholic pancreatitis 8527   admission   Chronic back pain    Chronic pancreatitis ([place])    based on ct findings 2016   COPD (chronic obstructive pulmonary disease) ([place])    Diabetes mellitus     type 2   Diverticulosis    Gastritis    GERD (gastroesophageal reflux disease)    Headache    History of radiation therapy 05/12/17- 06/22/17   Left cheek and bilateral neck/ 60 Gy in 30 fractions to gross disease   Hyperlipidemia    Hypertension    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    Right shoulder pain    Testicular hypofunction     Past Surgical History:  Procedure Laterality Date   BALLOON DILATION  12/31/2021   Procedure: BALLOON DILATION;  Surgeon: [name], MD;  [facility];  Service: Endoscopy;;   CAROTID STENT  06/2018   COLONOSCOPY  2003   Dr. [name], polyps   COLONOSCOPY  2005   Dr. [name], multiple diverticula   COLONOSCOPY  2008   Dr. [name], diverticulosis   COLONOSCOPY WITH PROPOFOL  N/A 10/21/2016   Dr. [name]: Diverticulosis, two 5-7 mm polyps removed. path-tubular adenomas.  Next colonoscopy in 5 years.   COLONOSCOPY WITH PROPOFOL N/A 12/31/2021   Procedure: COLONOSCOPY WITH PROPOFOL;  Surgeon: [name], MD;  [facility];  Service: Endoscopy;  Laterality: N/A;  8:15am   ESOPHAGOGASTRODUODENOSCOPY     Multiple EGDs. 1999 EGD showed gastric ulcers, no H pylori and benign biopsies performed by Dr. [name]. 2001 gastric ulcer healed. Last EGD 2005 had gastritis.   ESOPHAGOGASTRODUODENOSCOPY (EGD) WITH PROPOFOL N/A 12/31/2021   Procedure: ESOPHAGOGASTRODUODENOSCOPY (EGD) WITH PROPOFOL;  Surgeon: [name], MD;  [facility];  Service: Endoscopy;  Laterality: N/A;   IR GASTROSTOMY TUBE REMOVAL  02/08/2020   IR REPLACE G-TUBE SIMPLE WO FLUORO  03/20/2019   IR REPLACE G-TUBE SIMPLE WO FLUORO  09/21/2019   IR REPLC GASTRO/COLONIC TUBE PERCUT W/FLUORO  05/16/2018   Left partial  mandibulectomy, Scapular free flap reconstruction, selective neck dissection, tracheotomy, and resection of intraoral palate cancer. Left 03/01/2017   [facility]   LUNG BIOPSY     MOUTH SURGERY     PARTIAL NEPHRECTOMY Left 2012   POLYPECTOMY  10/21/2016   Procedure: POLYPECTOMY;  Surgeon: [name], MD;  [facility];  Service: Endoscopy;;  hepatic flexure x2   POLYPECTOMY  12/31/2021   Procedure: POLYPECTOMY INTESTINAL;  Surgeon: [name], MD;  [facility];  Service: Endoscopy;;   SURGERY OF LIP  06/2019   lip revision   TRANSURETHRAL RESECTION OF PROSTATE N/A 03/01/2019   Procedure: TRANSURETHRAL RESECTION OF THE PROSTATE (TURP)WITH CYSTOSCOPY;  Surgeon: Raynelle Bring, MD;  [facility];  Service: Urology;  Laterality: N/A;     Physical Exam: Blood pressure 126/76, pulse 89, temperature 98.2 F (36.8 C), temperature source Oral, height 5\' 11"  (1.803 m), weight 181 lb 12.8 oz (82.5 kg), SpO2 98 %. Gen:      No acute distress ENT:  s/p mandibular resection and reconstruction, mucus membranes moist Lungs:    No increased respiratory effort, symmetric chest wall excursion, diminished CV:         Regular rate and rhythm; no murmurs, rubs, or gallops.  No pedal edema Abd:      + bowel sounds; soft, non-tender; no distension MSK: no acute synovitis of DIP or PIP joints, no mechanics hands.  Skin:      Warm and dry; no rashes Neuro: dysarthric Psych: alert and oriented x3, normal mood and affect   Data Reviewed/Medical Decision Making:  Independent interpretation of tests: Imaging:  Review of patient's PET CT  images revealed RUL mass probable endobronchial lesion with associated mass and atelectasis. This is hypermetabolic. The patient's images have been independently reviewed by me.    PFTs:  Labs:  Lab Results  Component Value Date   WBC 7.2 12/29/2021   HGB 13.7 12/29/2021   HCT 43.1 12/29/2021   MCV 79.8 (L)  12/29/2021   PLT 294 12/29/2021   Lab Results  Component Value Date   NA 135 12/29/2021   K 4.2 12/29/2021   CL 102 12/29/2021   CO2 24 12/29/2021      Immunization status:  Immunization History  Administered Date(s) Administered   Moderna Sars-Covid-2 Vaccination 10/19/2019, 11/16/2019   Pneumococcal Polysaccharide-23 04/21/2017     I reviewed prior external note(s) from ENT, oncology, rad onc, pCP  I reviewed the result(s) of the labs and imaging as noted above.  I have ordered PFT  Discussion of management or test interpretation with another colleague.   Assessment:  Right upper lobe lung mass concerning for primary lung malignancy History of oropharyngeal cancer s/p extensive facial resection and reconstruction in 2018 and radiation History of tobacco use disorder, quit 2018  Plan/Recommendations:  We discussed disease management and progression at length today. We have discussed the risks and benefits of bronchoscopy for diagnosis of probable new lung cancer and he is agreeable to proceed. Plan is to proceed with bronchoscopy with EBUS and endobronchial lesion biopsy. I will try to set this up with my schedule otherwise will see if one of my colleagues can add him on. Unfortunately he can't do this Friday because he is on aspirin and plavix. Will need this held 5 days prior to procedure.   Will obtain PFTs as well.    Return to Care: Return in about 4 weeks (around 05/19/2022).  Lenice Llamas, MD Pulmonary and [place]  CC: [name], MD

## 2022-04-21 NOTE — Patient Instructions (Addendum)
I am referring you to one of my colleagues to have a bronchoscopy done for diagnosing your lung mass. We will call you later this week to set this up.   I will see you back after your procedure to see how things are going.   You will need to stop your plavix and aspirin five days prior to the procedure.  You will need a ride to and from the procedure.  You will need to stop eating at midnight the night before the procedure.   I am also ordering breathing testing to be done next available.

## 2022-04-21 NOTE — Telephone Encounter (Signed)
Order placed for EBUS/video bronchoscopy

## 2022-04-21 NOTE — Telephone Encounter (Signed)
Dr Verlee Monte - unfortunately I can't get him done at [facility] or [facility] those dates you provided.  [facility] can do 9/12 but they don't have a 7:30 slot.  What other dates and location do you want me to try - 9/14 still has openings at [facility].

## 2022-04-21 NOTE — Telephone Encounter (Signed)
Can you set it up for 9/14 at [facility]? I had been trying to hold it open for a nav case if we needed it but looks like he definitely needs this done soon.   Thanks!!

## 2022-04-22 ENCOUNTER — Telehealth: Payer: Self-pay | Admitting: Student

## 2022-04-22 DIAGNOSIS — R918 Other nonspecific abnormal finding of lung field: Secondary | ICD-10-CM

## 2022-04-22 NOTE — Telephone Encounter (Signed)
Called and spoke with pt's spouse letting her know where pt needed to go for the covid test prior to bronch and she verbalized understanding. Placed the order for the covid test. Nothing further needed.

## 2022-04-23 NOTE — Telephone Encounter (Addendum)
FYI: Patient is in need of transportation for COVID test. Spoke with lab and patient care coordinates and confirmed that patient can get COVID test done same day as procedure as long as patient is there at 5:30am. Colin Wagner, per [facility] and informed to have patient there at 5:30am to have test. Jeanett Schlein confirmed that patient had transportation for procedure on 9/7

## 2022-04-23 NOTE — Telephone Encounter (Signed)
Per endo, needed to move this procedure to [facility] at 12pm on 9/8. Can I postpone 11:30 and 1:30 clinic appointments on 9/8 and block these slots?   Thanks!

## 2022-04-24 ENCOUNTER — Ambulatory Visit
Admission: EM | Admit: 2022-04-24 | Discharge: 2022-04-24 | Disposition: A | Discharge status: Home / Self Care | Payer: Medicare Other | Attending: Nurse Practitioner | Admitting: Nurse Practitioner

## 2022-04-24 ENCOUNTER — Encounter: Payer: Self-pay | Admitting: Emergency Medicine

## 2022-04-24 DIAGNOSIS — N481 Balanitis: Secondary | ICD-10-CM | POA: Diagnosis not present

## 2022-04-24 MED ORDER — FLUCONAZOLE 150 MG PO TABS
150.0000 mg | ORAL_TABLET | Freq: Every day | ORAL | 0 refills | Status: DC
Start: 2022-04-24 — End: 2022-05-20

## 2022-04-24 NOTE — Discharge Instructions (Addendum)
Take medication as prescribed. Increase fluids.  Recommend drinking at least 6-8 8 ounce glasses of water daily while symptoms persist. Continue good hygiene to include cleaning the foreskin area daily or as needed for prevention. Continue to monitor blood glucose levels. Follow-up with your primary care physician if symptoms fail to improve.

## 2022-04-24 NOTE — ED Provider Notes (Signed)
[facility]    CSN: [id number] Arrival date & time: 04/24/22  0911      History   Chief Complaint No chief complaint on file.   HPI Colin Wagner is a 71 y.o. male.   The history is provided by the patient and a relative.   Patient presents today stating that he has a yeast infection around the tip of his penis.  Patiently current being treated for head and neck cancer that is currently being treated by ENT.  He last presented to our urgent care for Candida balanitis on 02/07/2022, was managed with Diflucan x3 tabs.  Patient states symptoms improved with this medication.  Complains of itching around the tip of the penis along with some discomfort with urination.  He states he has had white penile discharge under the foreskin.  He denies fever, chills, chest pain, abdominal pain, urinary frequency, urgency, hesitancy, hematuria, or foul-smelling urine.  Patient states he does have a history of diabetes.  States that he may have "eaten too many sweets" causing his symptoms.  States his last A1c was around 7.  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    Right shoulder pain    Testicular hypofunction     Patient Active Problem List   Diagnosis  Date Noted   GERD (gastroesophageal reflux disease)    IDA (iron deficiency anemia)    GI bleed 12/04/2019   Anemia    Rectal bleed 11/05/2019   Acute respiratory failure with hypoxia ([place]) 07/29/2019   Pneumonia due to COVID-19 virus 07/28/2019   AKI (acute kidney injury) ([place]) 07/27/2019   Hyperkalemia 07/27/2019   COVID-19 virus infection 07/27/2019   Urinary retention due to benign prostatic hyperplasia 03/01/2019   Attention to G-tube ([place]) 02/08/2018   UTI (urinary tract infection) 04/20/2017   UTI due to Klebsiella species 04/20/2017   Essential hypertension 04/20/2017   Chronic pancreatitis ([place]) 04/20/2017   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   Malnutrition of moderate degree 78/46/9629   Complicated UTI (urinary tract infection)    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   Rectal bleeding 09/28/2016   Hyponatremia 04/28/2015   Abdominal pain 04/27/2015   Acute pancreatitis 04/27/2015   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 [name], polyps   COLONOSCOPY  2005   Dr. [name], multiple diverticula   COLONOSCOPY  2008   Dr. [name], diverticulosis   COLONOSCOPY WITH PROPOFOL N/A 10/21/2016   Dr. [name]: Diverticulosis, two 5-7 mm polyps removed. path-tubular adenomas.  Next colonoscopy in 5 years.   COLONOSCOPY WITH PROPOFOL N/A 12/31/2021   Procedure: COLONOSCOPY WITH PROPOFOL;  Surgeon: [name], MD;  [facility]: [facility];  Service: Endoscopy;  Laterality: N/A;  8:15am    ESOPHAGOGASTRODUODENOSCOPY     Multiple EGDs. 1999 EGD showed gastric ulcers, no H pylori and benign biopsies performed by Dr. [name]. 2001 gastric ulcer healed. Last EGD 2005 had gastritis.   ESOPHAGOGASTRODUODENOSCOPY (EGD) WITH PROPOFOL N/A 12/31/2021   Procedure: ESOPHAGOGASTRODUODENOSCOPY (EGD) WITH PROPOFOL;  Surgeon: [name], MD;  [facility];  Service: Endoscopy;  Laterality: N/A;   IR GASTROSTOMY TUBE REMOVAL  02/08/2020   IR REPLACE G-TUBE SIMPLE WO FLUORO  03/20/2019   IR REPLACE G-TUBE SIMPLE WO FLUORO  09/21/2019   IR REPLC GASTRO/COLONIC TUBE PERCUT W/FLUORO  05/16/2018   Left partial mandibulectomy, Scapular free flap reconstruction, selective neck dissection, tracheotomy, and resection of intraoral palate cancer. Left 03/01/2017   [facility]   LUNG BIOPSY     MOUTH SURGERY     PARTIAL NEPHRECTOMY Left 2012   POLYPECTOMY  10/21/2016   Procedure: POLYPECTOMY;  Surgeon: [name], MD;  [facility];  Service: Endoscopy;;  hepatic flexure x2   POLYPECTOMY  12/31/2021   Procedure: POLYPECTOMY INTESTINAL;  Surgeon: [name], MD;  [facility];  Service: Endoscopy;;   SURGERY OF LIP  06/2019   lip revision   TRANSURETHRAL RESECTION OF PROSTATE N/A 03/01/2019   Procedure: TRANSURETHRAL RESECTION OF THE PROSTATE (TURP)WITH CYSTOSCOPY;  Surgeon: Raynelle Bring, MD;  Location: [facility];  Service: Urology;  Laterality: N/A;       Home Medications    Prior to Admission medications   Medication Sig Start Date End Date Taking? Authorizing Provider  fluconazole (DIFLUCAN) 150 MG tablet Take 1 tablet (150 mg total) by mouth daily. Take 1 tablet by mouth.  May repeat every 72 hours while symptoms persist for up to 2 additional doses. 04/24/22  Yes Lakaya Tolen-Warren, Alda Lea, NP  acetaminophen (TYLENOL) 500 MG tablet Take 1,000 mg by mouth every 6 (six) hours as needed for mild pain or moderate pain.    [provider]  albuterol (VENTOLIN HFA) 108 (90 Base) MCG/ACT inhaler Inhale 2 puffs into the lungs every 4 (four) hours as needed for wheezing or shortness of breath. 03/29/22   [provider]  amLODipine (NORVASC) 5 MG tablet Take 5 mg by mouth daily. 12/02/20   [provider]  aspirin EC 81 MG tablet Take 81 mg by mouth daily.    [provider]  atorvastatin (LIPITOR) 40 MG tablet Take 40 mg by mouth daily. 02/14/17   [provider]  cholecalciferol (VITAMIN D) 25 MCG (1000 UNIT) tablet Take 1,000 Units by mouth daily.    [provider]  clopidogrel (PLAVIX) 75 MG tablet Take 75 mg by mouth daily. 05/27/19   [provider]  cyclobenzaprine (FLEXERIL) 10 MG tablet Take 10 mg by mouth 4 (four) times  daily as needed for muscle spasms. 09/06/18   [provider]  ferrous sulfate 325 (65 FE) MG tablet Take 325 mg by mouth daily with breakfast.    [provider]  fluticasone (FLONASE) 50 MCG/ACT nasal spray Place 2 sprays into both nostrils daily.    [provider]  GARLIC PO Take 1 tablet by mouth daily.    [provider]  glipiZIDE (GLUCOTROL XL) 2.5 MG 24 hr tablet Take 2.5 mg by mouth every morning. 08/11/18   [provider]  hydrochlorothiazide (HYDRODIURIL) 12.5 MG tablet Take 12.5 mg by mouth daily. 04/24/19   [provider]  HYDROcodone-acetaminophen (NORCO) 7.5-325 MG tablet Take 1 tablet by mouth every 6 (six) hours as needed (pain). 01/12/17   [name], MD  LEVEMIR FLEXTOUCH 100 UNIT/ML Pen Inject 40 Units into the skin at bedtime. X 6 more nights, then return back to 22 units at bedtime Patient taking differently: Inject 32 Units into the skin every evening. 07/31/19   Orson Eva, MD  lisinopril (ZESTRIL) 5 MG tablet Take 5 mg by mouth daily.    [provider]  LORazepam (ATIVAN) 1 MG tablet Take 1 mg by mouth every 6 (six) hours as needed for anxiety or sleep. 03/31/15    [provider]  pantoprazole (PROTONIX) 40 MG tablet Take 1 tablet (40 mg total) by mouth 2 (two) times daily before a meal. 04/06/22   Rourk, Cristopher Estimable, MD  senna (SENOKOT) 8.6 MG TABS tablet Take 1 tablet (8.6 mg total) by mouth at bedtime as needed for mild constipation. May be needed as pain medication can constipate. 01/12/17   [name], MD  Sennosides 25 MG TABS Take 25 mg by mouth daily as needed (severe constipation).    [provider]  sildenafil (REVATIO) 20 MG tablet Take 60 mg by mouth daily as needed (erectile dysfunction).    [provider]  sildenafil (VIAGRA) 100 MG tablet Take 100 mg by mouth daily as needed for erectile dysfunction.    [provider]  tadalafil (CIALIS) 20 MG tablet Take 1 tablet (20 mg total) by mouth daily as needed for erectile dysfunction. Take 30-60 minutes prior to intercourse Patient not taking: Reported on 04/22/2022 11/19/21   Primus Bravo., MD  Testosterone Cypionate 200 MG/ML SOLN Inject 300 mg as directed every 14 (fourteen) days.    [provider]  traZODone (DESYREL) 50 MG tablet Take 50 mg by mouth at bedtime.    [provider]    Family History Family History  Problem Relation Age of Onset   Hypertension Mother    Colon cancer Neg Hx     Social History Social History   Tobacco Use   Smoking status: Former    Packs/day: 0.50    Years: 35.00    Total pack years: 17.50    Types: Cigarettes    Quit date: 03/01/2017    Years since quitting: 5.1   Smokeless tobacco: Never   Tobacco comments:    quit 02-2017  Vaping Use   Vaping Use: Never used  Substance Use Topics   Alcohol use: No    Comment: quit 2000 but relapse in 2016. no etoh since hospitalized 2016.    Drug use: No     Allergies   Patient has no known allergies.   Review of Systems Review of Systems Per HPI  Physical Exam Triage Vital Signs ED Triage Vitals  Enc Vitals Group     BP  04/24/22  0930 124/70     Pulse Rate 04/24/22 0930 95     Resp 04/24/22 0930 16     Temp 04/24/22 0930 98.4 F (36.9 C)     Temp Source 04/24/22 0930 Oral     SpO2 04/24/22 0930 95 %     Weight --      Height --      Head Circumference --      Peak Flow --      Pain Score 04/24/22 0931 2     Pain Loc --      Pain Edu? --      Excl. in [place]? --    No data found.  Updated Vital Signs BP 124/70 (BP Location: Right Arm)   Pulse 95   Temp 98.4 F (36.9 C) (Oral)   Resp 16   SpO2 95%   Visual Acuity Right Eye Distance:   Left Eye Distance:   Bilateral Distance:    Right Eye Near:   Left Eye Near:    Bilateral Near:     Physical Exam Vitals and nursing note reviewed.  Constitutional:      General: He is not in acute distress.    Appearance: Normal appearance.  HENT:     Head: Normocephalic.  Eyes:     Pupils: Pupils are equal, round, and reactive to light.  Cardiovascular:     Rate and Rhythm: Normal rate and regular rhythm.     Pulses: Normal pulses.  Pulmonary:     Effort: Pulmonary effort is normal.     Breath sounds: Normal breath sounds.  Abdominal:     General: Bowel sounds are normal.     Palpations: Abdomen is soft.  Genitourinary:    Comments: declined Musculoskeletal:     Cervical back: Normal range of motion.  Skin:    General: Skin is warm and dry.  Neurological:     General: No focal deficit present.     Mental Status: He is alert and oriented to person, place, and time.  Psychiatric:        Mood and Affect: Mood normal.        Behavior: Behavior normal.      UC Treatments / Results  Labs (all labs ordered are listed, but only abnormal results are displayed) Labs Reviewed - No data to display  EKG   Radiology No results found.  Procedures Procedures (including critical care time)  Medications Ordered in UC Medications - No data to display  Initial Impression / Assessment and Plan / UC Course  I have reviewed the triage vital signs and  the nursing notes.  Pertinent labs & imaging results that were available during my care of the patient were reviewed by me and considered in my medical decision making (see chart for details).  Patient presents for recurrent balanitis.  On exam, his vital signs are stable, he is in no acute distress.  Symptoms are consistent with recurrent balanitis.  Patient was prescribed fluconazole as this appears to help his symptoms.  Patient advised to continue proper hygiene while continuing to monitor blood glucose levels.  Patient was given ED return precautions.  Patient advised to follow-up with his primary care physician if symptoms fail to improve.  Patient verbalizes understanding.  All questions were answered. Final Clinical Impressions(s) / UC Diagnoses   Final diagnoses:  Balanitis     Discharge Instructions      Take medication as prescribed. Increase fluids.  Recommend drinking at  least 6-8 8 ounce glasses of water daily while symptoms persist. Continue good hygiene to include cleaning the foreskin area daily or as needed for prevention. Continue to monitor blood glucose levels. Follow-up with your primary care physician if symptoms fail to improve.      ED Prescriptions     Medication Sig Dispense Auth. Provider   fluconazole (DIFLUCAN) 150 MG tablet Take 1 tablet (150 mg total) by mouth daily. Take 1 tablet by mouth.  May repeat every 72 hours while symptoms persist for up to 2 additional doses. 3 tablet Habib Kise-Warren, Alda Lea, NP      PDMP not reviewed this encounter.   Tish Men, NP 04/24/22 1005

## 2022-04-24 NOTE — ED Triage Notes (Signed)
States he has a yeast infection on his penis that started last night.  States he has had this before and was seen here at urgent care.

## 2022-04-29 ENCOUNTER — Encounter (HOSPITAL_COMMUNITY): Admission: RE | Payer: Self-pay | Source: Home / Self Care

## 2022-04-29 ENCOUNTER — Ambulatory Visit (HOSPITAL_COMMUNITY): Admission: RE | Admit: 2022-04-29 | Payer: Medicare Other | Source: Home / Self Care | Admitting: Student

## 2022-04-29 SURGERY — BRONCHOSCOPY, WITH EBUS
Anesthesia: General | Laterality: Right

## 2022-04-30 ENCOUNTER — Encounter (HOSPITAL_COMMUNITY)
Admission: RE | Disposition: A | Discharge status: Home / Self Care | Payer: Self-pay | Source: Home / Self Care | Attending: Student

## 2022-04-30 ENCOUNTER — Ambulatory Visit (HOSPITAL_COMMUNITY)
Admission: RE | Admit: 2022-04-30 | Discharge: 2022-04-30 | Disposition: A | Discharge status: Home / Self Care | Payer: Medicare Other | Attending: Student | Admitting: Student

## 2022-04-30 ENCOUNTER — Ambulatory Visit (HOSPITAL_BASED_OUTPATIENT_CLINIC_OR_DEPARTMENT_OTHER): Payer: Medicare Other | Admitting: Certified Registered Nurse Anesthetist

## 2022-04-30 ENCOUNTER — Ambulatory Visit (HOSPITAL_COMMUNITY): Payer: Medicare Other

## 2022-04-30 ENCOUNTER — Other Ambulatory Visit: Payer: Self-pay

## 2022-04-30 ENCOUNTER — Ambulatory Visit (HOSPITAL_COMMUNITY): Payer: Medicare Other | Admitting: Certified Registered Nurse Anesthetist

## 2022-04-30 DIAGNOSIS — Z7982 Long term (current) use of aspirin: Secondary | ICD-10-CM | POA: Diagnosis not present

## 2022-04-30 DIAGNOSIS — Z7984 Long term (current) use of oral hypoglycemic drugs: Secondary | ICD-10-CM | POA: Insufficient documentation

## 2022-04-30 DIAGNOSIS — Z7902 Long term (current) use of antithrombotics/antiplatelets: Secondary | ICD-10-CM | POA: Diagnosis not present

## 2022-04-30 DIAGNOSIS — R590 Localized enlarged lymph nodes: Secondary | ICD-10-CM | POA: Diagnosis not present

## 2022-04-30 DIAGNOSIS — C3411 Malignant neoplasm of upper lobe, right bronchus or lung: Secondary | ICD-10-CM | POA: Diagnosis not present

## 2022-04-30 DIAGNOSIS — R918 Other nonspecific abnormal finding of lung field: Secondary | ICD-10-CM

## 2022-04-30 DIAGNOSIS — Z923 Personal history of irradiation: Secondary | ICD-10-CM | POA: Insufficient documentation

## 2022-04-30 DIAGNOSIS — I10 Essential (primary) hypertension: Secondary | ICD-10-CM

## 2022-04-30 DIAGNOSIS — E119 Type 2 diabetes mellitus without complications: Secondary | ICD-10-CM

## 2022-04-30 DIAGNOSIS — Z79899 Other long term (current) drug therapy: Secondary | ICD-10-CM | POA: Diagnosis not present

## 2022-04-30 DIAGNOSIS — Z20822 Contact with and (suspected) exposure to covid-19: Secondary | ICD-10-CM | POA: Diagnosis not present

## 2022-04-30 DIAGNOSIS — Z87891 Personal history of nicotine dependence: Secondary | ICD-10-CM

## 2022-04-30 DIAGNOSIS — Z85818 Personal history of malignant neoplasm of other sites of lip, oral cavity, and pharynx: Secondary | ICD-10-CM | POA: Insufficient documentation

## 2022-04-30 DIAGNOSIS — C771 Secondary and unspecified malignant neoplasm of intrathoracic lymph nodes: Secondary | ICD-10-CM | POA: Diagnosis not present

## 2022-04-30 DIAGNOSIS — J449 Chronic obstructive pulmonary disease, unspecified: Secondary | ICD-10-CM | POA: Diagnosis not present

## 2022-04-30 DIAGNOSIS — Z5309 Procedure and treatment not carried out because of other contraindication: Secondary | ICD-10-CM

## 2022-04-30 HISTORY — PX: FINE NEEDLE ASPIRATION: SHX5430

## 2022-04-30 HISTORY — PX: ENDOBRONCHIAL ULTRASOUND: SHX5096

## 2022-04-30 HISTORY — PX: VIDEO BRONCHOSCOPY: SHX5072

## 2022-04-30 HISTORY — PX: BIOPSY: SHX5522

## 2022-04-30 LAB — GLUCOSE, CAPILLARY
Glucose-Capillary: 88 mg/dL (ref 70–99)
Glucose-Capillary: 112 mg/dL — ABNORMAL HIGH (ref 70–99)

## 2022-04-30 LAB — SARS CORONAVIRUS 2 BY RT PCR: SARS Coronavirus 2 by RT PCR: NEGATIVE

## 2022-04-30 SURGERY — VIDEO BRONCHOSCOPY WITHOUT FLUORO
Anesthesia: General | Laterality: Right

## 2022-04-30 MED ORDER — ALBUMIN HUMAN 5 % IV SOLN: 12.5000 g | Freq: Once | INTRAVENOUS | Status: DC

## 2022-04-30 MED ORDER — ONDANSETRON HCL 4 MG/2ML IJ SOLN
INTRAMUSCULAR | Status: DC | PRN
  Administered 2022-04-30: 4 mg via INTRAVENOUS

## 2022-04-30 MED ORDER — PROPOFOL 500 MG/50ML IV EMUL
INTRAVENOUS | Status: AC
  Filled 2022-04-30: qty 100

## 2022-04-30 MED ORDER — ALBUTEROL SULFATE HFA 108 (90 BASE) MCG/ACT IN AERS
INHALATION_SPRAY | RESPIRATORY_TRACT | Status: DC | PRN
  Administered 2022-04-30: 8 via RESPIRATORY_TRACT

## 2022-04-30 MED ORDER — LIDOCAINE HCL (PF) 4 % IJ SOLN
INTRAMUSCULAR | Status: AC
  Filled 2022-04-30: qty 5

## 2022-04-30 MED ORDER — ROCURONIUM BROMIDE 10 MG/ML (PF) SYRINGE
PREFILLED_SYRINGE | INTRAVENOUS | Status: DC | PRN
  Administered 2022-04-30: 50 mg via INTRAVENOUS

## 2022-04-30 MED ORDER — LACTATED RINGERS IV SOLN: INTRAVENOUS | Status: DC

## 2022-04-30 MED ORDER — SODIUM CHLORIDE (PF) 0.9 % IJ SOLN
PREFILLED_SYRINGE | INTRAMUSCULAR | Status: DC | PRN
  Administered 2022-04-30: 6 mL

## 2022-04-30 MED ORDER — LIDOCAINE VISCOUS HCL 2 % MT SOLN
15.0000 mL | Freq: Once | OROMUCOSAL | Status: AC
  Administered 2022-04-30: 15 mL via OROMUCOSAL

## 2022-04-30 MED ORDER — PROPOFOL 10 MG/ML IV BOLUS
INTRAVENOUS | Status: DC | PRN
  Administered 2022-04-30 (×3): 40 mg via INTRAVENOUS

## 2022-04-30 MED ORDER — DEXAMETHASONE SODIUM PHOSPHATE 10 MG/ML IJ SOLN
INTRAMUSCULAR | Status: DC | PRN
  Administered 2022-04-30: 10 mg via INTRAVENOUS

## 2022-04-30 MED ORDER — FENTANYL CITRATE (PF) 100 MCG/2ML IJ SOLN
INTRAMUSCULAR | Status: AC
  Filled 2022-04-30: qty 2

## 2022-04-30 MED ORDER — ALBUMIN HUMAN 5 % IV SOLN
INTRAVENOUS | Status: AC
  Filled 2022-04-30: qty 250

## 2022-04-30 MED ORDER — LIDOCAINE HCL (PF) 2 % IJ SOLN
5.0000 mL | INTRAMUSCULAR | Status: AC
  Administered 2022-04-30: 5 mL
  Filled 2022-04-30: qty 5

## 2022-04-30 MED ORDER — PHENYLEPHRINE 80 MCG/ML (10ML) SYRINGE FOR IV PUSH (FOR BLOOD PRESSURE SUPPORT)
PREFILLED_SYRINGE | INTRAVENOUS | Status: DC | PRN
  Administered 2022-04-30 (×2): 160 ug via INTRAVENOUS
  Administered 2022-04-30 (×2): 80 ug via INTRAVENOUS

## 2022-04-30 MED ORDER — LIDOCAINE HCL 1 % IJ SOLN
INTRAMUSCULAR | Status: AC
  Filled 2022-04-30: qty 20

## 2022-04-30 MED ORDER — LIDOCAINE HCL (PF) 4 % IJ SOLN
5.0000 mL | Freq: Once | INTRAMUSCULAR | Status: AC
  Administered 2022-04-30: 2 mL

## 2022-04-30 MED ORDER — EPINEPHRINE 1 MG/10ML IJ SOSY
PREFILLED_SYRINGE | INTRAMUSCULAR | Status: AC
  Filled 2022-04-30: qty 10

## 2022-04-30 MED ORDER — EPHEDRINE SULFATE-NACL 50-0.9 MG/10ML-% IV SOSY
PREFILLED_SYRINGE | INTRAVENOUS | Status: DC | PRN
  Administered 2022-04-30: 5 mg via INTRAVENOUS

## 2022-04-30 MED ORDER — FENTANYL CITRATE (PF) 100 MCG/2ML IJ SOLN
INTRAMUSCULAR | Status: DC | PRN
Start: 2022-04-30 — End: 2022-04-30
  Administered 2022-04-30: 50 ug via INTRAVENOUS

## 2022-04-30 MED ORDER — LIDOCAINE VISCOUS HCL 2 % MT SOLN
OROMUCOSAL | Status: AC
  Filled 2022-04-30: qty 15

## 2022-04-30 MED ORDER — DEXMEDETOMIDINE (PRECEDEX) IN NS 20 MCG/5ML (4 MCG/ML) IV SYRINGE
PREFILLED_SYRINGE | INTRAVENOUS | Status: DC | PRN
  Administered 2022-04-30 (×5): 12 ug via INTRAVENOUS

## 2022-04-30 MED ORDER — SUGAMMADEX SODIUM 200 MG/2ML IV SOLN
INTRAVENOUS | Status: DC | PRN
  Administered 2022-04-30: 200 mg via INTRAVENOUS

## 2022-04-30 NOTE — Discharge Instructions (Signed)
-   Bloody streaks, little clots of blood for first 24-48 hours is totally normal. If you cough up clot size of your thumb then call our clinic [phone], if ongoing/multiple episodes of this would plan to head to ED - Do not resume plavix until 05/02/22 and would only restart it as long your bloody cough is subsiding.  - I will call with results in 3-5 business days

## 2022-04-30 NOTE — Op Note (Signed)
Flexible and EBUS Bronchoscopy Procedure Note  TORY SEPTER  [id number]  1951-07-31  Date:04/30/22  Time:1:38 PM   Provider Performing:Kathleen Tamm Jerilynn Mages Verlee Monte   Procedure: Flexible bronchoscopy and EBUS Bronchoscopy  Indication(s) RUL mass Mediastinal and hilar lymphadenopathy  Consent Risks of the procedure as well as the alternatives and risks of each were explained to the patient and/or caregiver.  Consent for the procedure was obtained.  Anesthesia General Anesthesia   Time Out Verified patient identification, verified procedure, site/side was marked, verified correct patient position, special equipment/implants available, medications/allergies/relevant history reviewed, required imaging and test results available.   Sterile Technique Usual hand hygiene, masks, gowns, and gloves were used   Procedure Description Diagnostic bronchoscope advanced through endotracheal tube and into airway.  Airways were examined down to subsegmental level with findings noted below.  The diagnostic bronchoscope was then removed and the EBUS bronchoscope was advanced into airway with station 10L biopsied and sent for slide, cell block.  The EBUS bronchoscope was removed after assuring no active bleeding from biopsy site. Fiberoptic scope reinserted and EBBx taken of RUL and LUL takeoffs as below. 3cc of undiluted 1:1000 epi x2 to RUL takeoff and iced saline, iced saline to LUL takeoff for hemostasis. Secretions suctioned, scope removed.  Findings:  - Exophytic nodules noted overlying RBI, irregular pink mucosa overlying RUL takeoff (100% occluded), RBI, LUL takeoff.(Also 100% occluded) - 7m Mass at 10L s/p TBNA with 9 passes - EBBx x3 taken of RUL takeoff  - EBBx x4 taken of LUL takeoff   Complications/Tolerance None; patient tolerated the procedure well. Chest X-ray is not needed post procedure.   EBL Minimal   Specimen(s) TBNA, EBBx sent for cytology

## 2022-04-30 NOTE — Transfer of Care (Signed)
Immediate Anesthesia Transfer of Care Note  Patient: Colin Wagner  Procedure(s) Performed: Procedure(s): VIDEO BRONCHOSCOPY WITHOUT FLUORO (Right) ENDOBRONCHIAL ULTRASOUND (Right) FINE NEEDLE ASPIRATION (FNA) LINEAR BIOPSY  Patient Location: PACU  Anesthesia Type:General  Level of Consciousness: Alert, Awake, Oriented  Airway & Oxygen Therapy: Patient Spontanous Breathing  Post-op Assessment: Report given to RN  Post vital signs: Reviewed and stable  Last Vitals:  Vitals:   04/30/22 1020 04/30/22 1350  BP: (!) 145/75 94/61  Pulse: 84 84  Resp: (!) 23 20  Temp: 36.9 C (!) 36.4 C  SpO2: 03% 12%    Complications: No apparent anesthesia complications

## 2022-04-30 NOTE — Anesthesia Procedure Notes (Signed)
Procedure Name: Awake intubation Date/Time: 04/30/2022 12:57 PM  Performed by: Gerald Leitz, CRNAPre-anesthesia Checklist: Patient identified, Patient being monitored, Timeout performed, Emergency Drugs available and Suction available Patient Re-evaluated:Patient Re-evaluated prior to induction Oxygen Delivery Method: Circle system utilized and Simple face mask Preoxygenation: Pre-oxygenation with 100% oxygen Induction Type: IV induction Laryngoscope Size: Mac and 3 Tube type: Oral Tube size: 8.5 mm Number of attempts: 1 Airway Equipment and Method: Stylet, Video-laryngoscopy and Fiberoptic brochoscope Placement Confirmation: ETT inserted through vocal cords under direct vision, positive ETCO2 and breath sounds checked- equal and bilateral Secured at: 24 cm Tube secured with: Tape Dental Injury: Teeth and Oropharynx as per pre-operative assessment  Future Recommendations: Recommend- awake intubation

## 2022-04-30 NOTE — Anesthesia Preprocedure Evaluation (Addendum)
Anesthesia Evaluation  Patient identified by MRN, date of birth, ID band Patient awake    Reviewed: Allergy & Precautions, NPO status , Patient's Chart, lab work & pertinent test results  History of Anesthesia Complications (+) DIFFICULT AIRWAY and history of anesthetic complications  Airway Mallampati: IV   Neck ROM: Limited   Comment: Distorted facial and airway anatomy 2/2 previous jaw CA resection and radiation Dental  (+) Dental Advisory Given, Poor Dentition   Pulmonary COPD,  COPD inhaler, former smoker,    breath sounds clear to auscultation       Cardiovascular hypertension, Pt. on medications  Rhythm:Regular Rate:Normal     Neuro/Psych  Headaches, negative psych ROS   GI/Hepatic PUD, GERD  ,(+)     substance abuse  alcohol use,   Endo/Other  diabetes, Type 2, Oral Hypoglycemic Agents  Renal/GU negative Renal ROS  negative genitourinary   Musculoskeletal negative musculoskeletal ROS (+)   Abdominal   Peds  Hematology  (+) Blood dyscrasia (on plavix), anemia ,   Anesthesia Other Findings H/o jaw cancer s/p resection and radiation requiring multiple awake fiberoptic intubations.   Reproductive/Obstetrics                           Anesthesia Physical Anesthesia Plan  ASA: 3  Anesthesia Plan: General   Post-op Pain Management: Minimal or no pain anticipated and Precedex   Induction: Intravenous  PONV Risk Score and Plan: 2 and Dexamethasone, Ondansetron and Treatment may vary due to age or medical condition  Airway Management Planned: Oral ETT and Awake Intubation Planned  Additional Equipment:   Intra-op Plan:   Post-operative Plan: Extubation in OR  Informed Consent: I have reviewed the patients History and Physical, chart, labs and discussed the procedure including the risks, benefits and alternatives for the proposed anesthesia with the patient or authorized  representative who has indicated his/her understanding and acceptance.     Dental advisory given  Plan Discussed with: CRNA  Anesthesia Plan Comments: (Plan for awake fiberoptic intubation. 1% nebulized lidocaine and 4% viscous lidocaine in preop. Superior larygneal block and lidocaine pledgets in pyriform sinus by pulmonologist Dr. Verlee Monte. Plan for precedex infusion during the procedure. To PACU for recovery. )       Anesthesia Quick Evaluation

## 2022-04-30 NOTE — Interval H&P Note (Signed)
History and Physical Interval Note:  04/30/2022 12:21 PM  Colin Wagner  has presented today for surgery, with the diagnosis of rt upper lobe mass.  The various methods of treatment have been discussed with the patient and family. After consideration of risks, benefits and other options for treatment, the patient has consented to  Procedure(s): VIDEO BRONCHOSCOPY WITHOUT FLUORO (Right) ENDOBRONCHIAL ULTRASOUND (Right) as a surgical intervention.  The patient's history has been reviewed, patient examined, no change in status, stable for surgery.  I have reviewed the patient's chart and labs.  Questions were answered to the patient's satisfaction.     Maryjane Hurter

## 2022-05-01 ENCOUNTER — Encounter (HOSPITAL_COMMUNITY): Payer: Self-pay | Admitting: Student

## 2022-05-02 NOTE — Anesthesia Postprocedure Evaluation (Signed)
Anesthesia Post Note  Patient: Colin Wagner  Procedure(s) Performed: VIDEO BRONCHOSCOPY WITHOUT FLUORO (Right) ENDOBRONCHIAL ULTRASOUND (Right) FINE NEEDLE ASPIRATION (FNA) LINEAR BIOPSY     Patient location during evaluation: PACU Anesthesia Type: General Level of consciousness: awake and alert Pain management: pain level controlled Vital Signs Assessment: post-procedure vital signs reviewed and stable Respiratory status: spontaneous breathing, nonlabored ventilation, respiratory function stable and patient connected to nasal cannula oxygen Cardiovascular status: blood pressure returned to baseline and stable Postop Assessment: no apparent nausea or vomiting Anesthetic complications: no   No notable events documented.  Last Vitals:  Vitals:   04/30/22 1520 04/30/22 1535  BP: (!) 145/87 (!) 113/55  Pulse: 86 83  Resp: 11 12  Temp:  (!) 36.4 C  SpO2: 95% 94%    Last Pain:  Vitals:   04/30/22 1535  TempSrc:   PainSc: 0-No pain                 Chloeann Alfred L Carmelina Balducci

## 2022-05-04 ENCOUNTER — Telehealth: Payer: Self-pay | Admitting: Internal Medicine

## 2022-05-04 ENCOUNTER — Telehealth: Payer: Self-pay | Admitting: Student

## 2022-05-04 DIAGNOSIS — C3490 Malignant neoplasm of unspecified part of unspecified bronchus or lung: Secondary | ICD-10-CM

## 2022-05-04 DIAGNOSIS — D491 Neoplasm of unspecified behavior of respiratory system: Secondary | ICD-10-CM

## 2022-05-04 LAB — CYTOLOGY - NON PAP

## 2022-05-04 NOTE — Telephone Encounter (Signed)
Scheduled appt per 9/12 referral. Pt is aware of appt date and time. Pt is aware to arrive 15 mins prior to appt time and to bring and updated insurance card. Pt is aware of appt location.   

## 2022-05-04 NOTE — Telephone Encounter (Signed)
Called and discussed results. Referral placed for oncology, MR Brain ordered, and will notify his radiation oncologist, pulmonologist as well.  [place]

## 2022-05-08 ENCOUNTER — Other Ambulatory Visit (HOSPITAL_COMMUNITY): Payer: Medicaid Other

## 2022-05-11 ENCOUNTER — Telehealth: Payer: Self-pay | Admitting: *Deleted

## 2022-05-11 NOTE — Telephone Encounter (Signed)
Called patient to ask question, unable to leave message, will try later

## 2022-05-11 NOTE — Progress Notes (Signed)
Thoracic Location of Tumor / Histology: Malignant cells consistent with squamous cell carcinoma.  LUNG, RUL MASS   Biopsies of  (if applicable) revealed:  08/29/4942  FINAL MICROSCOPIC DIAGNOSIS:  A. LYMPH NODE, 10L, FINE NEEDLE ASPIRATION:  - Malignant cells consistent with squamous cell carcinoma.  B. LUNG, RUL MASS, BIOPSY:  - Malignant cells consistent with squamous cell carcinoma  C. LUNG, LUL MASS, BIOPSY:  -  No malignant cells identified (few atypical cells favor reactive  cells).   COMMENT:   The malignant cells (Block A1) were interrogated with p40 (squamous  marker) and TTF-1 (lung marker-non-squamous) and they are p40 positive  and TTF-1 negative, which is characteristic for squamous cell carcinoma.  The controls are satisfactory.   Dr. Saralyn Pilar has reviewed the slides (intradepartmental QA review for  malignancy).    SPECIMEN ADEQUACY:  A. Satisfactory for Evaluation  B. Satisfactory for Evaluation  C. Satisfactory for Evaluation     GROSS:  Received is/are  Prepared:  (A) 1) 2 slides (1 for quick stain), 2) 2 slides (1 for quick  stain), 3) 2 slides (1 for quick stain), and 15 ccs of red Saline  solution from needle rinses, (B) 5 ccs of cloudy tan Saline solution  with biopsy tissue pieces for cell block only, (C) 5 ccs of pink Saline  solution with biopsy tissue pieces for cell block only. (TS:CM:cm)  Smears: (A) 6 (B) 0 (C) 0  Concentration Method (Thin Prep): (A) 1 (B) 0 (C) 0  Cell Block: (A) 1 (B) 1 (C) 1  Additional Studies: n/a    Tobacco/Marijuana/Snuff/ETOH use: no  Past/Anticipated interventions by cardiothoracic surgery, if any:  Past/Anticipated interventions by medical oncology, if any:  Signs/Symptoms Weight changes, if any: weight varies 4 to 5 pounds Respiratory complaints, if any: no Hemoptysis, if any: yes,some time Pain issues, if any:  no  SAFETY ISSUES: Prior radiation? Yes ,October 2018 jaw Pacemaker/ICD? no  Possible current  pregnancy?no Is the patient on methotrexate? no  Current Complaints / other details:   Vitals:   05/17/22 0739  BP: (!) 149/72  Pulse: 91  Resp: 18  SpO2: 97%  Weight: 82.8 kg  Height: 5\' 11"  (1.803 m)

## 2022-05-11 NOTE — Telephone Encounter (Signed)
CALLED PATIENT TO ASK ABOUT WHETHER HE WOULD BE WILLING TO HAVE LAB WORK ON 05-14-22, PATIENT AGREED TO COME ON 05-14-22 @ 1:30 PM

## 2022-05-13 ENCOUNTER — Other Ambulatory Visit: Payer: Self-pay | Admitting: *Deleted

## 2022-05-13 NOTE — Progress Notes (Signed)
The proposed treatment discussed in conference is for discussion purpose only and is not a binding recommendation.  The patients have not been physically examined, or presented with their treatment options.  Therefore, final treatment plans cannot be decided.  

## 2022-05-14 ENCOUNTER — Other Ambulatory Visit: Payer: Self-pay

## 2022-05-14 ENCOUNTER — Ambulatory Visit
Admission: RE | Admit: 2022-05-14 | Discharge: 2022-05-14 | Disposition: A | Discharge status: Home / Self Care | Payer: Medicare Other | Source: Ambulatory Visit | Attending: Radiation Oncology | Admitting: Radiation Oncology

## 2022-05-14 ENCOUNTER — Ambulatory Visit (HOSPITAL_COMMUNITY)
Admission: RE | Admit: 2022-05-14 | Discharge: 2022-05-14 | Disposition: A | Discharge status: Home / Self Care | Payer: Medicare Other | Source: Ambulatory Visit | Attending: Student | Admitting: Student

## 2022-05-14 DIAGNOSIS — C3411 Malignant neoplasm of upper lobe, right bronchus or lung: Secondary | ICD-10-CM | POA: Diagnosis present

## 2022-05-14 DIAGNOSIS — C3490 Malignant neoplasm of unspecified part of unspecified bronchus or lung: Secondary | ICD-10-CM | POA: Insufficient documentation

## 2022-05-14 DIAGNOSIS — D491 Neoplasm of unspecified behavior of respiratory system: Secondary | ICD-10-CM

## 2022-05-14 DIAGNOSIS — C411 Malignant neoplasm of mandible: Secondary | ICD-10-CM

## 2022-05-14 LAB — BASIC METABOLIC PANEL
Anion gap: 4 — ABNORMAL LOW (ref 5–15)
BUN: 42 mg/dL — ABNORMAL HIGH (ref 8–23)
CO2: 30 mmol/L (ref 22–32)
Calcium: 10.2 mg/dL (ref 8.9–10.3)
Chloride: 99 mmol/L (ref 98–111)
Creatinine, Ser: 2.03 mg/dL — ABNORMAL HIGH (ref 0.61–1.24)
GFR, Estimated: 34 mL/min — ABNORMAL LOW (ref >60)
Glucose, Bld: 216 mg/dL — ABNORMAL HIGH (ref 70–99)
Potassium: 5.3 mmol/L — ABNORMAL HIGH (ref 3.5–5.1)
Sodium: 133 mmol/L — ABNORMAL LOW (ref 135–145)

## 2022-05-14 MED ORDER — GADOBUTROL 1 MMOL/ML IV SOLN
8.0000 mL | Freq: Once | INTRAVENOUS | Status: AC | PRN
  Administered 2022-05-14: 8 mL via INTRAVENOUS

## 2022-05-16 NOTE — Progress Notes (Signed)
Radiation Oncology         [phone] ________________________________  Initial Outpatient Consultation  Name: Colin Wagner MRN: [id number]  Date: 05/17/2022  DOB: [birth year]  VE:[id number], [name], MD  Maryjane Hurter, MD   REFERRING PHYSICIAN: Maryjane Hurter, MD  DIAGNOSIS:    ICD-10-CM   1. Malignant neoplasm of bronchus and lung ([place])  C34.90     2. Carcinoma of buccal mucosa ([place])  C06.0     3. Squamous cell carcinoma of mandible (HCC)  G28.3 Basic Metabolic Panel (BMET)    4. 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 male who is known to me for his history of oropharyngeal cancer in 2018, s/p resection and adjuvant radiation.  The patient presented earlier this past August with the complaint of persistent cough x 2 months. This prompted a chest x-ray on 03/29/22 which revealed a mass-like consolidation of the right upper lung with questioned complete collapse of the right upper lobe.  Chest CT on 03/30/22 further detailed complete right upper lobe collapse with abrupt cut off of the upper lobe bronchus and mediastinal adenopathy, highly concerning for bronchogenic carcinoma. CT otherwise showed no evidence of contralateral pulmonary  metastasis.  Restaging PET scan on 04/12/22 showed the RUL mass as hypermetabolic and with postobstructive atelectasis, consistent with primary bronchogenic neoplasm. PET also showed hypermetabolic mediastinal and bi-hilar adenopathy consistent with nodal disease involvement, postsurgical changes in the left neck/base of tongue with flap and mandibular reconstruction, and a tiny focus of hypermetabolic activity in the soft tissues along the medial aspect of the left mandibular angle, noted to be physiologic in etiology or reflective of posttreatment changes.   Accordingly, the patient was referred to pulmonology, Dr. Shearon Stalls, on 04/21/22 for further evaluation. During this visit, the patient endorsed intermittent hemoptysis with clots of blood occurring usually 2-3 times per week. The patient was also noted to be on a modified diet and had esophageal dilation which helps him tolerate food. His significant other who was present at the time of this visit also reported him to have a decreased appetite and unintentional weight loss.   The patient then opted to proceed with bronchoscopy with RUL and nodal biopsies on 04/30/22 under the care of Dr. Verlee Monte. Pathology from the procedure revealed malignant cells consistent with squamous cell carcinoma of the RUL. Biopsy of lymph node 10L was positive for squamous cell carcinoma. Biopsy of a LUL mass also performed showed no evidence of malignancy.   The patient also recently had an MRI of the brain on 05/14/22, which is negative for metastatic disease but shows a lesion consistent with a venous anomaly.  We will order another MRI to be done in December for surveillance  I have personally reviewed his imaging.  He reports that his hemoptysis has been mild, mixed with sputum.  He is with his significant other who reports that he has been a little more winded than usual.  Since I last saw them they had a very enjoyable cruise in the [place] and they shared some  fantastic pictures with me.  Wt Readings from Last 3 Encounters:  05/17/22 182 lb 8 oz (82.8 kg)  04/30/22 183 lb (83 kg)  04/21/22 181 lb 12.8 oz (82.5 kg)   He denies any significant weight loss.  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    Indication for treatment:  Curative      Radiation treatment dates:   05/12/2017 - 06/22/2017 Site/dose:     Left Cheek and bilateral neck / 60 Gy in 30 fractions total dose Beams/energy:   Helical IMRT / 6 MV photons  PAST MEDICAL HISTORY:  has a past medical history of Alcohol abuse, Alcoholic pancreatitis (6962), Chronic back pain, Chronic pancreatitis ([place]), COPD (chronic obstructive pulmonary disease) ([place]), Diabetes mellitus, Diverticulosis, Gastritis, GERD (gastroesophageal reflux disease), Headache, History of radiation therapy (05/12/17- 06/22/17), Hyperlipidemia, Hypertension, 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    PAST SURGICAL HISTORY: Past Surgical History:  Procedure Laterality Date   BALLOON DILATION  12/31/2021   Procedure: BALLOON DILATION;  Surgeon: [name], MD;  [facility];  Service: Endoscopy;;   BIOPSY  04/30/2022   Procedure: BIOPSY;  Surgeon: Maryjane Hurter, MD;  [facility];  Service: Pulmonary;;   CAROTID STENT  06/2018   COLONOSCOPY  2003   Dr. [name], polyps   COLONOSCOPY  2005   Dr. [name], multiple diverticula   COLONOSCOPY  2008   Dr. [name], diverticulosis   COLONOSCOPY WITH PROPOFOL N/A 10/21/2016   Dr. [name]: Diverticulosis, two 5-7 mm polyps removed. path-tubular adenomas.  Next colonoscopy in 5 years.   COLONOSCOPY WITH PROPOFOL N/A 12/31/2021   Procedure: COLONOSCOPY WITH PROPOFOL;  Surgeon: [name], MD;  [facility];  Service: Endoscopy;  Laterality: N/A;  8:15am   ENDOBRONCHIAL  ULTRASOUND Right 04/30/2022   Procedure: ENDOBRONCHIAL ULTRASOUND;  Surgeon: Maryjane Hurter, MD;  [facility];  Service: Pulmonary;  Laterality: Right;   ESOPHAGOGASTRODUODENOSCOPY     Multiple EGDs. 1999 EGD showed gastric ulcers, no H pylori and benign biopsies performed by Dr. [name]. 2001 gastric ulcer healed. Last EGD 2005 had gastritis.   ESOPHAGOGASTRODUODENOSCOPY (EGD) WITH PROPOFOL N/A 12/31/2021   Procedure: ESOPHAGOGASTRODUODENOSCOPY (EGD) WITH PROPOFOL;  Surgeon: [name], MD;  [facility];  Service: Endoscopy;  Laterality: N/A;   FINE NEEDLE ASPIRATION  04/30/2022   Procedure: FINE NEEDLE ASPIRATION (FNA) LINEAR;  Surgeon: Maryjane Hurter, MD;  [facility];  Service: Pulmonary;;   IR GASTROSTOMY TUBE REMOVAL  02/08/2020   IR REPLACE G-TUBE SIMPLE WO FLUORO  03/20/2019   IR REPLACE G-TUBE SIMPLE WO FLUORO  09/21/2019   IR REPLC GASTRO/COLONIC TUBE PERCUT W/FLUORO  05/16/2018   Left partial mandibulectomy, Scapular free flap reconstruction, selective neck dissection, tracheotomy, and resection of intraoral palate cancer. Left 03/01/2017   [facility]   LUNG BIOPSY     MOUTH SURGERY     PARTIAL NEPHRECTOMY Left 2012   POLYPECTOMY  10/21/2016   Procedure: POLYPECTOMY;  Surgeon: [name], MD;  [facility];  Service: Endoscopy;;  hepatic flexure x2   POLYPECTOMY  12/31/2021   Procedure: POLYPECTOMY INTESTINAL;  Surgeon: [name], MD;  [facility];  Service: Endoscopy;;   SURGERY OF LIP  06/2019   lip revision   TRANSURETHRAL RESECTION OF PROSTATE N/A 03/01/2019   Procedure: TRANSURETHRAL RESECTION OF THE PROSTATE (TURP)WITH CYSTOSCOPY;  Surgeon: Raynelle Bring, MD;  [facility];  Service: Urology;  Laterality: N/A;   VIDEO BRONCHOSCOPY Right 04/30/2022   Procedure: VIDEO BRONCHOSCOPY WITHOUT FLUORO;  Surgeon: Maryjane Hurter, MD;  [facility];  Service: Pulmonary;   Laterality: Right;    FAMILY HISTORY: family history includes Hypertension in his mother.  SOCIAL HISTORY:  reports that he quit smoking about 5 years ago. His smoking use included cigarettes. He has a 17.50 pack-year smoking history. He has never used smokeless tobacco. He reports that he does not drink alcohol and does not use drugs.  ALLERGIES: Patient has no known allergies.  MEDICATIONS:  Current Outpatient Medications  Medication Sig Dispense Refill   acetaminophen (TYLENOL) 500 MG tablet Take 1,000 mg by mouth every 6 (six) hours as needed for mild pain or moderate pain.     albuterol (VENTOLIN HFA) 108 (90 Base) MCG/ACT inhaler Inhale 2 puffs into the lungs every 4 (four) hours as needed for wheezing or shortness of breath.     amLODipine (NORVASC) 5 MG tablet Take 5 mg by mouth daily.     aspirin EC 81 MG tablet Take 81 mg by mouth daily.     atorvastatin (LIPITOR) 40 MG tablet Take 40 mg by mouth daily.     cholecalciferol (VITAMIN D) 25 MCG (1000 UNIT) tablet Take 1,000 Units by mouth daily.     clopidogrel (PLAVIX) 75 MG tablet Take 75 mg by mouth daily.     cyclobenzaprine (FLEXERIL) 10 MG tablet Take 10 mg by mouth 4 (four) times daily as needed for muscle spasms.     ferrous sulfate 325 (65 FE) MG tablet Take 325 mg by mouth daily with breakfast.     fluticasone (FLONASE) 50 MCG/ACT nasal spray Place 2 sprays into both nostrils daily.     GARLIC PO Take 1 tablet by mouth daily.     glipiZIDE (GLUCOTROL XL) 2.5 MG 24 hr tablet Take 2.5 mg by mouth every morning.     hydrochlorothiazide (HYDRODIURIL) 12.5 MG tablet Take 12.5 mg by mouth daily.     HYDROcodone-acetaminophen (NORCO) 7.5-325 MG tablet Take 1 tablet by mouth every 6 (six) hours as needed (pain). 60 tablet 0   LEVEMIR FLEXTOUCH 100 UNIT/ML Pen Inject 40 Units into the skin at bedtime. X 6 more nights, then return back to 22 units at bedtime (Patient taking differently: Inject 32 Units into the skin every evening.)  15 mL 11   lisinopril (ZESTRIL) 5 MG tablet Take 5 mg by mouth daily.     LORazepam (ATIVAN) 1 MG tablet Take 1 mg by mouth every 6 (six) hours as needed for anxiety or sleep.  5   pantoprazole (PROTONIX) 40 MG tablet Take 1 tablet (40 mg total) by mouth 2 (two) times daily before a meal. 60 tablet 3   senna (SENOKOT) 8.6 MG TABS tablet Take 1 tablet (8.6 mg total) by mouth at bedtime as needed for mild constipation. May be needed as pain medication can constipate. 120 each 1   Sennosides 25 MG TABS Take 25 mg by  mouth daily as needed (severe constipation).     sildenafil (REVATIO) 20 MG tablet Take 60 mg by mouth daily as needed (erectile dysfunction).     sildenafil (VIAGRA) 100 MG tablet Take 100 mg by mouth daily as needed for erectile dysfunction.     tadalafil (CIALIS) 20 MG tablet Take 1 tablet (20 mg total) by mouth daily as needed for erectile dysfunction. Take 30-60 minutes prior to intercourse 20 tablet 11   Testosterone Cypionate 200 MG/ML SOLN Inject 300 mg as directed every 14 (fourteen) days.     traZODone (DESYREL) 50 MG tablet Take 50 mg by mouth at bedtime.     fluconazole (DIFLUCAN) 150 MG tablet Take 1 tablet (150 mg total) by mouth daily. Take 1 tablet by mouth.  May repeat every 72 hours while symptoms persist for up to 2 additional doses. (Patient not taking: Reported on 05/17/2022) 3 tablet 0   No current facility-administered medications for this encounter.    REVIEW OF SYSTEMS:  Notable for that above.   PHYSICAL EXAM:  height is 5\' 11"  (1.803 m) and weight is 182 lb 8 oz (82.8 kg). His blood pressure is 149/72 (abnormal) and his pulse is 91. His respiration is 18 and oxygen saturation is 97%.   General: Alert and oriented, in no acute distress  HEENT: Head is normocephalic. Extraocular movements are intact. Oropharynx and oral cavity show no evidence of tumor.  He has stable felt facial swelling from surgical and radiation treatment for his previous head and neck  cancer Neck: Neck is supple, no palpable cervical or supraclavicular lymphadenopathy.  There is fibrosis in the neck related to previous surgery Heart: Regular in rate and rhythm with no murmurs, rubs, or gallops. Chest: Clear to auscultation bilaterally, with no rhonchi, wheezes, or rales. Abdomen: Soft, nontender, nondistended, with no rigidity or guarding. Lymphatics: see Neck Exam Skin: No concerning lesions. Musculoskeletal: Well-nourished.  Ambulatory Neurologic: Cranial nerves II through XII are grossly intact. No obvious focalities. Speech is fluent. Coordination is intact. Psychiatric: Judgment and insight are intact. Affect is appropriate.   ECOG = 1  0 - Asymptomatic (Fully active, able to carry on all predisease activities without restriction)  1 - Symptomatic but completely ambulatory (Restricted in physically strenuous activity but ambulatory and able to carry out work of a light or sedentary nature. For example, light housework, office work)  2 - Symptomatic, <50% in bed during the day (Ambulatory and capable of all self care but unable to carry out any work activities. Up and about more than 50% of waking hours)  3 - Symptomatic, >50% in bed, but not bedbound (Capable of only limited self-care, confined to bed or chair 50% or more of waking hours)  4 - Bedbound (Completely disabled. Cannot carry on any self-care. Totally confined to bed or chair)  5 - Death   Eustace Pen MM, Creech RH, Tormey DC, et al. [phone]). "Toxicity and response criteria of the [facility]". [place] Oncol. 5 (6): 649-55   LABORATORY DATA:  Lab Results  Component Value Date   WBC 7.2 12/29/2021   HGB 13.7 12/29/2021   HCT 43.1 12/29/2021   MCV 79.8 (L) 12/29/2021   PLT 294 12/29/2021   CMP     Component Value Date/Time   NA 133 (L) 05/14/2022 1326   K 5.3 (H) 05/14/2022 1326   CL 99 05/14/2022 1326   CO2 30 05/14/2022 1326   GLUCOSE 216 (H) 05/14/2022 1326   BUN  42 (H) 05/14/2022 1326   CREATININE 2.03 (H) 05/14/2022 1326   CALCIUM 10.2 05/14/2022 1326   PROT 6.3 (L) 11/05/2019 1419   ALBUMIN 3.5 11/05/2019 1419   AST 18 11/05/2019 1419   ALT 16 11/05/2019 1419   ALKPHOS 60 11/05/2019 1419   BILITOT 0.6 11/05/2019 1419   GFRNONAA 34 (L) 05/14/2022 1326   GFRAA 50 (L) 11/05/2019 1419         RADIOGRAPHY: MR BRAIN W WO CONTRAST  Result Date: 05/17/2022 CLINICAL DATA:  Lung cancer staging EXAM: MRI HEAD WITHOUT AND WITH CONTRAST TECHNIQUE: Multiplanar, multiecho pulse sequences of the brain and surrounding structures were obtained without and with intravenous contrast. CONTRAST:  20mL GADAVIST GADOBUTROL 1 MMOL/ML IV SOLN COMPARISON:  None Available. FINDINGS: Image quality is degraded by motion artifact. Brain: There is no acute intracranial hemorrhage, extra-axial fluid collection, or acute infarct. Parenchymal volume is normal for age. The ventricles are normal in size. Gray-white differentiation is preserved. There is no significant burden of underlying white matter microangiopathic change. 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 There is no surrounding edema or mass effect. There is no other abnormal enhancement. There is no mass effect or midline shift. Vascular: Normal flow voids. Skull and upper cervical spine: Normal marrow signal. Sinuses/Orbits: The paranasal sinuses are clear. There is remote fracture of the right lamina papyracea. The globes and orbits are otherwise unremarkable. Other: There is a left mastoid effusion, unchanged since the CT neck from 2019. The imaged left nasopharynx is unremarkable. Postsurgical changes are noted in the left mandible and adjacent soft tissues IMPRESSION: 1. Image quality is degraded by motion artifact. 2. 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 Consider short interval follow up in 2-3 months with post contrast black blood sequences to ensure stability. 3. No other abnormal enhancement to suggest intracranial metastatic disease. Electronically Signed   By: Valetta Mole M.D.   On: 05/17/2022 08:08   DG Chest Port 1 View  Result Date: 04/30/2022 CLINICAL DATA:  Status post bronchoscopy EXAM: PORTABLE CHEST 1 VIEW COMPARISON:  Previous studies including the chest radiographs done on 03/29/2022 and PET-CT done on 04/12/2022 FINDINGS: Cardiac size is within normal limits. There is homogeneous opacity in right upper lung field consistent with central obstructing lesion with atelectasis in right upper lobe. There are no new infiltrates or signs of pulmonary edema. There is no pleural effusion or pneumothorax. Surgical clips are seen in left axilla and left side of neck. IMPRESSION: Opacification in right upper lung field has not changed. There is no pneumothorax. Electronically Signed   By: Elmer Picker M.D.   On: 04/30/2022 14:08      IMPRESSION/PLAN: This is a very pleasant 71 year old gentleman who is well-known by me.  He has a previous history of locally advanced oral cavity cancer which was treated 5 years ago.  I believe he has been cured of that.  He now has a locally advanced process in his chest consistent with Stage IIIC lung cancer, squamous cell histology.  I explained to the patient and his significant other that this is considered to be a second primary.  He and his significant other understand that this is a serious condition but it has curative treatment options.  We discussed the patient's diagnosis and general treatment for this, highlighting the role  of radiotherapy in the management.  We discussed the available radiation techniques, and focused on the details of logistics and delivery.    He understands that radiation therapy would be given over approximately 6 weeks with  concurrent systemic therapy (assuming he is a candidate for drug therapy).  He has an appointment with medical oncology for consultation later this week.  We discussed the risks, benefits, and side effects of radiotherapy. Side effects may include but not necessarily be limited to: Skin irritation, fatigue, esophagitis, injury to lung, injury to nerves including the brachial plexus (given previous radiation exposure to the supraclavicular fossas), injury to the heart, injury to the spinal cord.  We discussed the importance of getting pulmonary function testing for baseline function which is pending.  No guarantees of treatment were given. A consent form was signed and placed in the patient's medical record. The patient was encouraged to ask questions that I answered to the best of my ability.   He understands I will take special care to mold the radiation away from where the previous radiation dose was deposited for his head neck cancer.  He understands that there is some heightened risks of reirradiation close to previous radiation fields but I believe that with intensity-modulated radiation therapy the risk of severe injury will be acceptably low.  We will plan his treatment today and start his treatment next week.  IV contrast cannot be given during his CT simulation due to suboptimal renal function on his most recent labs.  We will fuse his PET scan to his treatment planning scan to help facilitate accurate tumor delineation.  I have sent a message to medical oncology to coordinate care.   MRI brain will be conducted again in December for surveillance.  On date of service, in total, I spent 55 minutes on this encounter. Patient was seen in person.   __________________________________________   [name], MD  This document serves as a record of services personally performed by [name], MD. It was created on her behalf by Roney Mans, a trained medical scribe. The creation of this record  is based on the scribe's personal observations and the provider's statements to them. This document has been checked and approved by the attending provider.

## 2022-05-17 ENCOUNTER — Other Ambulatory Visit: Payer: Self-pay | Admitting: Radiation Therapy

## 2022-05-17 ENCOUNTER — Ambulatory Visit
Admission: RE | Admit: 2022-05-17 | Discharge: 2022-05-17 | Disposition: A | Discharge status: Home / Self Care | Payer: Medicare Other | Source: Ambulatory Visit | Attending: Radiation Oncology | Admitting: Radiation Oncology

## 2022-05-17 ENCOUNTER — Ambulatory Visit: Admission: RE | Admit: 2022-05-17 | Payer: Medicare Other | Source: Ambulatory Visit | Admitting: Radiation Oncology

## 2022-05-17 ENCOUNTER — Encounter: Payer: Self-pay | Admitting: Radiation Oncology

## 2022-05-17 VITALS — BP 149/72 | HR 91 | Resp 18 | Ht 71.0 in | Wt 182.5 lb

## 2022-05-17 DIAGNOSIS — Z7902 Long term (current) use of antithrombotics/antiplatelets: Secondary | ICD-10-CM | POA: Diagnosis not present

## 2022-05-17 DIAGNOSIS — E785 Hyperlipidemia, unspecified: Secondary | ICD-10-CM | POA: Insufficient documentation

## 2022-05-17 DIAGNOSIS — C060 Malignant neoplasm of cheek mucosa: Secondary | ICD-10-CM

## 2022-05-17 DIAGNOSIS — Z87891 Personal history of nicotine dependence: Secondary | ICD-10-CM | POA: Diagnosis not present

## 2022-05-17 DIAGNOSIS — I10 Essential (primary) hypertension: Secondary | ICD-10-CM | POA: Insufficient documentation

## 2022-05-17 DIAGNOSIS — Z8711 Personal history of peptic ulcer disease: Secondary | ICD-10-CM | POA: Insufficient documentation

## 2022-05-17 DIAGNOSIS — Z923 Personal history of irradiation: Secondary | ICD-10-CM | POA: Diagnosis not present

## 2022-05-17 DIAGNOSIS — F1010 Alcohol abuse, uncomplicated: Secondary | ICD-10-CM | POA: Diagnosis not present

## 2022-05-17 DIAGNOSIS — C3411 Malignant neoplasm of upper lobe, right bronchus or lung: Secondary | ICD-10-CM

## 2022-05-17 DIAGNOSIS — C3490 Malignant neoplasm of unspecified part of unspecified bronchus or lung: Secondary | ICD-10-CM

## 2022-05-17 DIAGNOSIS — Z794 Long term (current) use of insulin: Secondary | ICD-10-CM | POA: Diagnosis not present

## 2022-05-17 DIAGNOSIS — Q283 Other malformations of cerebral vessels: Secondary | ICD-10-CM

## 2022-05-17 DIAGNOSIS — K219 Gastro-esophageal reflux disease without esophagitis: Secondary | ICD-10-CM | POA: Diagnosis not present

## 2022-05-17 DIAGNOSIS — J449 Chronic obstructive pulmonary disease, unspecified: Secondary | ICD-10-CM | POA: Insufficient documentation

## 2022-05-17 DIAGNOSIS — R042 Hemoptysis: Secondary | ICD-10-CM | POA: Diagnosis not present

## 2022-05-17 DIAGNOSIS — Z7982 Long term (current) use of aspirin: Secondary | ICD-10-CM | POA: Insufficient documentation

## 2022-05-17 DIAGNOSIS — Z7984 Long term (current) use of oral hypoglycemic drugs: Secondary | ICD-10-CM | POA: Diagnosis not present

## 2022-05-17 DIAGNOSIS — E291 Testicular hypofunction: Secondary | ICD-10-CM | POA: Diagnosis not present

## 2022-05-17 DIAGNOSIS — E119 Type 2 diabetes mellitus without complications: Secondary | ICD-10-CM | POA: Insufficient documentation

## 2022-05-17 DIAGNOSIS — Z85818 Personal history of malignant neoplasm of other sites of lip, oral cavity, and pharynx: Secondary | ICD-10-CM | POA: Diagnosis not present

## 2022-05-17 DIAGNOSIS — Z79899 Other long term (current) drug therapy: Secondary | ICD-10-CM | POA: Insufficient documentation

## 2022-05-17 DIAGNOSIS — C411 Malignant neoplasm of mandible: Secondary | ICD-10-CM

## 2022-05-18 ENCOUNTER — Other Ambulatory Visit: Payer: Self-pay

## 2022-05-18 ENCOUNTER — Other Ambulatory Visit: Payer: Self-pay | Admitting: Radiation Therapy

## 2022-05-18 DIAGNOSIS — C3490 Malignant neoplasm of unspecified part of unspecified bronchus or lung: Secondary | ICD-10-CM

## 2022-05-19 ENCOUNTER — Ambulatory Visit (INDEPENDENT_AMBULATORY_CARE_PROVIDER_SITE_OTHER): Payer: Medicare Other | Admitting: Internal Medicine

## 2022-05-19 ENCOUNTER — Encounter: Payer: Self-pay | Admitting: Internal Medicine

## 2022-05-19 VITALS — BP 126/68 | HR 98 | Ht 71.0 in | Wt 188.2 lb

## 2022-05-19 DIAGNOSIS — J449 Chronic obstructive pulmonary disease, unspecified: Secondary | ICD-10-CM | POA: Diagnosis not present

## 2022-05-19 DIAGNOSIS — J9611 Chronic respiratory failure with hypoxia: Secondary | ICD-10-CM | POA: Diagnosis not present

## 2022-05-19 DIAGNOSIS — C3491 Malignant neoplasm of unspecified part of right bronchus or lung: Secondary | ICD-10-CM

## 2022-05-19 MED ORDER — ANORO ELLIPTA 62.5-25 MCG/ACT IN AEPB: 1.0000 | INHALATION_SPRAY | Freq: Every day | RESPIRATORY_TRACT | 5 refills | Status: DC

## 2022-05-19 NOTE — Progress Notes (Signed)
[name]    [id number]    Mar 03, 1951  Primary Care Physician:Knowlton, [name], MD Date of Appointment: 05/19/2022 Established Patient Visit  Chief complaint:   Chief Complaint  Patient presents with   Follow-up    Pt f/u he is feeling well, breathing is okay some SOB on exertion     HPI: Colin Wagner is a 71 y.o. 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   Interval Updates: Here for follow up after bronchoscopy. He has seen radiation oncology and tentative plan is for systemic chemotherapy and radiation. He is following up with oncology later this week. He does have some persistent RUL post-obstructive atelectasis due to mass location which hopefully will respond to treatment.   Has PFTs pending. Still short of breath.  Uses albuterol inhaler at home with benefit.  Not on maintenance therapy.  Feels worried and has "a lot on his mind" with upcoming treatment plans  Desaturated on exertion today.  I have reviewed the patient's family social and past medical history and updated as appropriate.   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    Right shoulder pain    Testicular hypofunction     Past Surgical History:  Procedure Laterality Date   BALLOON DILATION  12/31/2021   Procedure: BALLOON DILATION;  Surgeon: [name], MD;  [facility];  Service: Endoscopy;;   BIOPSY  04/30/2022   Procedure: BIOPSY;  Surgeon: Maryjane Hurter, MD;  [facility]: [facility];  Service: Pulmonary;;   CAROTID STENT  06/2018   COLONOSCOPY  2003   Dr. [name], polyps   COLONOSCOPY  2005   Dr. [name], multiple diverticula   COLONOSCOPY  2008   Dr. [name], diverticulosis   COLONOSCOPY WITH PROPOFOL N/A 10/21/2016   Dr. [name]: Diverticulosis, two 5-7 mm polyps removed. path-tubular adenomas.  Next colonoscopy in 5 years.   COLONOSCOPY WITH PROPOFOL N/A 12/31/2021   Procedure: COLONOSCOPY WITH PROPOFOL;  Surgeon: [name], MD;  [facility];  Service: Endoscopy;  Laterality: N/A;  8:15am   ENDOBRONCHIAL ULTRASOUND Right 04/30/2022   Procedure: ENDOBRONCHIAL ULTRASOUND;  Surgeon: Maryjane Hurter, MD;  [facility];  Service: Pulmonary;  Laterality: Right;   ESOPHAGOGASTRODUODENOSCOPY     Multiple EGDs. 1999 EGD showed gastric ulcers, no H pylori and benign biopsies performed by Dr. Leane Para  Smith. 2001 gastric ulcer healed. Last EGD 2005 had gastritis.   ESOPHAGOGASTRODUODENOSCOPY (EGD) WITH PROPOFOL N/A 12/31/2021   Procedure: ESOPHAGOGASTRODUODENOSCOPY (EGD) WITH PROPOFOL;  Surgeon: [name], MD;  [facility];  Service: Endoscopy;  Laterality: N/A;   FINE NEEDLE ASPIRATION  04/30/2022   Procedure: FINE NEEDLE ASPIRATION (FNA) LINEAR;  Surgeon: Maryjane Hurter, MD;  [facility];  Service: Pulmonary;;   IR GASTROSTOMY TUBE REMOVAL  02/08/2020   IR REPLACE G-TUBE SIMPLE WO FLUORO  03/20/2019   IR REPLACE G-TUBE SIMPLE WO FLUORO  09/21/2019   IR REPLC GASTRO/COLONIC TUBE PERCUT W/FLUORO  05/16/2018   Left partial  mandibulectomy, Scapular free flap reconstruction, selective neck dissection, tracheotomy, and resection of intraoral palate cancer. Left 03/01/2017   [facility]   LUNG BIOPSY     MOUTH SURGERY     PARTIAL NEPHRECTOMY Left 2012   POLYPECTOMY  10/21/2016   Procedure: POLYPECTOMY;  Surgeon: [name], MD;  [facility];  Service: Endoscopy;;  hepatic flexure x2   POLYPECTOMY  12/31/2021   Procedure: POLYPECTOMY INTESTINAL;  Surgeon: [name], MD;  [facility];  Service: Endoscopy;;   SURGERY OF LIP  06/2019   lip revision   TRANSURETHRAL RESECTION OF PROSTATE N/A 03/01/2019   Procedure: TRANSURETHRAL RESECTION OF THE PROSTATE (TURP)WITH CYSTOSCOPY;  Surgeon: Raynelle Bring, MD;  [facility];  Service: Urology;  Laterality: N/A;   VIDEO BRONCHOSCOPY Right 04/30/2022   Procedure: VIDEO BRONCHOSCOPY WITHOUT FLUORO;  Surgeon: Maryjane Hurter, MD;  [facility];  Service: Pulmonary;  Laterality: Right;    Family History  Problem Relation Age of Onset   Hypertension Mother    Colon cancer Neg Hx     Social History   Occupational History   Not on file  Tobacco Use   Smoking status: Former    Packs/day: 0.50    Years: 35.00    Total pack years: 17.50    Types: Cigarettes    Quit date: 03/01/2017    Years since quitting: 5.2   Smokeless tobacco: Never   Tobacco comments:    quit 02-2017  Vaping Use   Vaping Use: Never used  Substance and Sexual Activity   Alcohol use: No    Comment: quit 2000 but relapse in 2016. no etoh since hospitalized 2016.    Drug use: No   Sexual activity: Not Currently     Physical Exam: Blood pressure 126/68, pulse 98, height 5\' 11"  (1.803 m), weight 188 lb 3.2 oz (85.4 kg), SpO2 (!) 88 %.  Gen:      No acute distress ENT:  s/p mandibular resection, no nasal polyps, mucus membranes moist Lungs:    No increased respiratory effort, symmetric chest wall excursion, clear to  auscultation bilaterally, diminished, no wheezes or crackles CV:         Regular rate and rhythm; no murmurs, rubs, or gallops.  No pedal edema Ext: digital clubbing  Data Reviewed: Imaging:   PFTs:      No data to display         I have personally reviewed the patient's PFTs and   Labs: Pathology reviewed - RUL and lymphadenopathy c/w SCC likely lung primary.   Immunization status: Immunization History  Administered Date(s) Administered   [employer] Vaccination 10/19/2019, 11/16/2019   Pneumococcal Polysaccharide-23 04/21/2017    External Records Personally Reviewed: oncology  Assessment:  Stage IIIC SCC of the lung, bilateral  local involvement. History of tobacco use disorder History of alcohol use disorder History of oropharyngeal carcinoma Chronic respiratory failure Probable COPD  Plan/Recommendations:  We did an ambulatory desaturation study which shows he requires 3LNC to maintain saturations over 88%. Will prescribe home oxygen with POC - new start.   Keep appointment for breathing testing in a couple weeks.  Start anoro inhaler 1 puff once a day.   Continue albuterol inhaler as needed.   We are prescribing oxygen for you - new start. [employer] will deliver oxygen and concentrator to your home.   Please call me if any concerns or issues before our follow up.      Return to Care: Return in about 7 weeks (around 07/07/2022).   Lenice Llamas, MD Pulmonary and [place]

## 2022-05-19 NOTE — Patient Instructions (Signed)
Please schedule follow up scheduled with myself in 2 months (before Thanksgiving).  If my schedule is not open yet, we will contact you with a reminder closer to that time. Please call [phone] if you haven't heard from [place] a month before.   Keep appointment for breathing testing in a couple weeks.  Start anoro inhaler 1 puff once a day.   Continue albuterol inhaler as needed.   We are prescribing oxygen for you - new start. [employer] will deliver oxygen and concentrator to your home.   Please call me if any concerns or issues before our follow up.

## 2022-05-20 ENCOUNTER — Inpatient Hospital Stay: Payer: Medicare Other | Attending: Internal Medicine | Admitting: Internal Medicine

## 2022-05-20 ENCOUNTER — Other Ambulatory Visit: Payer: Self-pay

## 2022-05-20 ENCOUNTER — Telehealth: Payer: Self-pay | Admitting: Medical Oncology

## 2022-05-20 ENCOUNTER — Inpatient Hospital Stay: Payer: Medicare Other

## 2022-05-20 VITALS — BP 157/87 | HR 102 | Temp 97.7°F | Resp 16 | Wt 186.6 lb

## 2022-05-20 DIAGNOSIS — Z87891 Personal history of nicotine dependence: Secondary | ICD-10-CM | POA: Diagnosis not present

## 2022-05-20 DIAGNOSIS — Z8553 Personal history of malignant neoplasm of renal pelvis: Secondary | ICD-10-CM

## 2022-05-20 DIAGNOSIS — C3411 Malignant neoplasm of upper lobe, right bronchus or lung: Secondary | ICD-10-CM | POA: Diagnosis present

## 2022-05-20 DIAGNOSIS — Z85818 Personal history of malignant neoplasm of other sites of lip, oral cavity, and pharynx: Secondary | ICD-10-CM | POA: Diagnosis not present

## 2022-05-20 DIAGNOSIS — Z5111 Encounter for antineoplastic chemotherapy: Secondary | ICD-10-CM

## 2022-05-20 DIAGNOSIS — C3490 Malignant neoplasm of unspecified part of unspecified bronchus or lung: Secondary | ICD-10-CM

## 2022-05-20 LAB — CMP (CANCER CENTER ONLY)
ALT: 7 U/L (ref 0–44)
AST: 9 U/L — ABNORMAL LOW (ref 15–41)
Albumin: 3.9 g/dL (ref 3.5–5.0)
Alkaline Phosphatase: 67 U/L (ref 38–126)
Anion gap: 7 (ref 5–15)
BUN: 40 mg/dL — ABNORMAL HIGH (ref 8–23)
CO2: 30 mmol/L (ref 22–32)
Calcium: 9.6 mg/dL (ref 8.9–10.3)
Chloride: 98 mmol/L (ref 98–111)
Creatinine: 1.65 mg/dL — ABNORMAL HIGH (ref 0.61–1.24)
GFR, Estimated: 44 mL/min — ABNORMAL LOW (ref >60)
Glucose, Bld: 212 mg/dL — ABNORMAL HIGH (ref 70–99)
Potassium: 5.5 mmol/L — ABNORMAL HIGH (ref 3.5–5.1)
Sodium: 135 mmol/L (ref 135–145)
Total Bilirubin: 0.4 mg/dL (ref 0.3–1.2)
Total Protein: 7.7 g/dL (ref 6.5–8.1)

## 2022-05-20 LAB — CBC WITH DIFFERENTIAL (CANCER CENTER ONLY)
Abs Immature Granulocytes: 0.01 10*3/uL (ref 0.00–0.07)
Basophils Absolute: 0 10*3/uL (ref 0.0–0.1)
Basophils Relative: 1 %
Eosinophils Absolute: 0.2 10*3/uL (ref 0.0–0.5)
Eosinophils Relative: 2 %
HCT: 39.5 % (ref 39.0–52.0)
Hemoglobin: 12.9 g/dL — ABNORMAL LOW (ref 13.0–17.0)
Immature Granulocytes: 0 %
Lymphocytes Relative: 7 %
Lymphs Abs: 0.6 10*3/uL — ABNORMAL LOW (ref 0.7–4.0)
MCH: 26 pg (ref 26.0–34.0)
MCHC: 32.7 g/dL (ref 30.0–36.0)
MCV: 79.5 fL — ABNORMAL LOW (ref 80.0–100.0)
Monocytes Absolute: 0.7 10*3/uL (ref 0.1–1.0)
Monocytes Relative: 8 %
Neutro Abs: 6.9 10*3/uL (ref 1.7–7.7)
Neutrophils Relative %: 82 %
Platelet Count: 324 10*3/uL (ref 150–400)
RBC: 4.97 MIL/uL (ref 4.22–5.81)
RDW: 16.5 % — ABNORMAL HIGH (ref 11.5–15.5)
WBC Count: 8.4 10*3/uL (ref 4.0–10.5)
nRBC: 0 % (ref 0.0–0.2)

## 2022-05-20 MED ORDER — LIDOCAINE-PRILOCAINE 2.5-2.5 % EX CREA: TOPICAL_CREAM | CUTANEOUS | 0 refills | Status: DC

## 2022-05-20 MED ORDER — PROCHLORPERAZINE MALEATE 10 MG PO TABS: 10.0000 mg | ORAL_TABLET | Freq: Four times a day (QID) | ORAL | 0 refills | Status: DC | PRN

## 2022-05-20 NOTE — Telephone Encounter (Signed)
EMLA cream needs prior auth . Sent  to [facility].

## 2022-05-20 NOTE — Progress Notes (Signed)
[place] [phone]   [phone]  CONSULT NOTE  REFERRING PHYSICIAN: Dr. Leslye Peer  REASON FOR CONSULTATION:  71 years old African-American male recently diagnosed with lung cancer.  HPI Colin Wagner is a 71 y.o. male with past medical history significant for alcohol abuse, alcoholic pancreatitis, chronic back pain, diabetes mellitus, COPD, GERD as well as radiotherapy to head and neck cancer, hypertension, dyslipidemia, history of left kidney cancer in 2012 and long history of smoking but quit in July 2018.  The patient mentioned that he had persistent cough that was going on for 2 months.  He had chest x-ray performed on 03/29/2022 and that showed mass consolidation of the right upper lobe of the lung with question complete collapse of the right upper lobe.  He had CT scan of the chest without contrast on 03/30/2022 and that showed complete right upper lobe collapse with associated volume loss and abrupt cut off of the right upper lobe bronchus just beyond its origin.  A central mass of the lung is not easily differentiated from the collapsed right lung on the noncontrast study.  There was no suspicious nodules within the aerated portion of the lungs and there was a stable 0.6 cm perifissural nodule likely benign.  The scan also showed interval development of several mildly enlarged mediastinal lymph nodes including 1.4 cm precarinal node and 1.7 cm subcarinal node.  The patient had a PET scan on 04/12/2022 and that showed hypermetabolic mass with postobstructive atelectasis in the paramedian right upper lobe consistent with primary bronchogenic neoplasm.  There was hypermetabolic mediastinal and bilateral hilar adenopathy consistent with nodal disease involvement but there was no evidence for distant hypermetabolic metastatic disease.  On 04/30/2022 the patient underwent flexible bronchoscopy with EBUS under the care of Dr. Verlee Monte.  The final pathology  ([id number]) of the left upper lobe lung mass as well as fine-needle aspiration of 10L L lymph node were consistent with squamous cell carcinoma. The patient had MRI of the brain on 05/14/2022 and that showed no evidence for metastatic disease to the brain. He was referred to me today for evaluation and recommendation regarding treatment of his condition. When seen today the patient continues to complain of chest congestion and cough productive of whitish sputum.  He denied having any chest pain, shortness of breath or hemoptysis.  He is very anxious.  He has no nausea, vomiting, diarrhea but has constipation.  He lost few pounds recently.  He has intermittent headache but no visual changes. Family history significant for mother with hypertension.  Father has unknown medical history.  An uncle with esophageal cancer and a cousin with lung cancer. The patient is married and lives in [place].  He has no children.  He was accompanied today by his wife Colin Wagner.  He has a history for smoking for around 50 years but quit March 01, 2017.  He also has a history of alcohol abuse but quit more than 20 years ago.  He has no history of drug abuse. 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    Right shoulder pain    Testicular hypofunction     Past Surgical History:  Procedure Laterality Date   BALLOON DILATION  12/31/2021   Procedure: BALLOON DILATION;  Surgeon: [name], MD;  [facility];  Service: Endoscopy;;   BIOPSY  04/30/2022   Procedure: BIOPSY;  Surgeon: Maryjane Hurter, MD;  Location: [facility];  Service: Pulmonary;;   CAROTID STENT  06/2018   COLONOSCOPY  2003   Dr. [name], polyps   COLONOSCOPY  2005   Dr. [name], multiple diverticula   COLONOSCOPY  2008   Dr. [name], diverticulosis   COLONOSCOPY WITH PROPOFOL N/A 10/21/2016   Dr. [name]: Diverticulosis, two 5-7 mm polyps removed. path-tubular adenomas.  Next colonoscopy in 5 years.   COLONOSCOPY WITH PROPOFOL N/A 12/31/2021   Procedure: COLONOSCOPY WITH PROPOFOL;  Surgeon: [name], MD;  [facility];  Service: Endoscopy;  Laterality: N/A;  8:15am   ENDOBRONCHIAL ULTRASOUND Right 04/30/2022   Procedure: ENDOBRONCHIAL ULTRASOUND;  Surgeon: Maryjane Hurter, MD;  [facility];  Service: Pulmonary;  Laterality: Right;   ESOPHAGOGASTRODUODENOSCOPY     Multiple EGDs. 1999 EGD showed gastric ulcers, no H pylori and benign biopsies performed by Dr. [name]. 2001 gastric ulcer healed. Last EGD 2005 had gastritis.   ESOPHAGOGASTRODUODENOSCOPY (EGD) WITH PROPOFOL N/A 12/31/2021   Procedure: ESOPHAGOGASTRODUODENOSCOPY (EGD) WITH PROPOFOL;  Surgeon: [name], MD;  [facility];  Service: Endoscopy;  Laterality: N/A;   FINE NEEDLE ASPIRATION  04/30/2022   Procedure: FINE NEEDLE ASPIRATION (FNA) LINEAR;  Surgeon: Maryjane Hurter, MD;  [facility];  Service: Pulmonary;;   IR GASTROSTOMY TUBE REMOVAL  02/08/2020   IR REPLACE G-TUBE SIMPLE WO FLUORO  03/20/2019   IR REPLACE G-TUBE SIMPLE WO FLUORO  09/21/2019   IR REPLC GASTRO/COLONIC TUBE PERCUT W/FLUORO  05/16/2018   Left partial mandibulectomy, Scapular  free flap reconstruction, selective neck dissection, tracheotomy, and resection of intraoral palate cancer. Left 03/01/2017   [facility]   LUNG BIOPSY     MOUTH SURGERY     PARTIAL NEPHRECTOMY Left 2012   POLYPECTOMY  10/21/2016   Procedure: POLYPECTOMY;  Surgeon: [name], MD;  [facility];  Service: Endoscopy;;  hepatic flexure x2   POLYPECTOMY  12/31/2021   Procedure: POLYPECTOMY INTESTINAL;  Surgeon: [name], MD;  [facility];  Service: Endoscopy;;   SURGERY OF LIP  06/2019   lip revision   TRANSURETHRAL RESECTION OF PROSTATE N/A 03/01/2019   Procedure: TRANSURETHRAL RESECTION OF THE PROSTATE (TURP)WITH CYSTOSCOPY;  Surgeon: Raynelle Bring, MD;  Location: [facility];  Service: Urology;  Laterality: N/A;   VIDEO BRONCHOSCOPY Right 04/30/2022   Procedure: VIDEO BRONCHOSCOPY WITHOUT FLUORO;  Surgeon: Maryjane Hurter, MD;  [facility];  Service: Pulmonary;  Laterality: Right;    Family History  Problem Relation Age of Onset  Hypertension Mother    Colon cancer Neg Hx     Social History Social History   Tobacco Use   Smoking status: Former    Packs/day: 0.50    Years: 35.00    Total pack years: 17.50    Types: Cigarettes    Quit date: 03/01/2017    Years since quitting: 5.2   Smokeless tobacco: Never   Tobacco comments:    quit 02-2017  Vaping Use   Vaping Use: Never used  Substance Use Topics   Alcohol use: No    Comment: quit 2000 but relapse in 2016. no etoh since hospitalized 2016.    Drug use: No    No Known Allergies  Current Outpatient Medications  Medication Sig Dispense Refill   acetaminophen (TYLENOL) 500 MG tablet Take 1,000 mg by mouth every 6 (six) hours as needed for mild pain or moderate pain.     albuterol (VENTOLIN HFA) 108 (90 Base) MCG/ACT inhaler Inhale 2 puffs into the lungs every 4 (four) hours as needed for wheezing or shortness of breath.     amLODipine (NORVASC) 5 MG tablet Take  5 mg by mouth daily.     aspirin EC 81 MG tablet Take 81 mg by mouth daily.     atorvastatin (LIPITOR) 40 MG tablet Take 40 mg by mouth daily.     cholecalciferol (VITAMIN D) 25 MCG (1000 UNIT) tablet Take 1,000 Units by mouth daily.     clopidogrel (PLAVIX) 75 MG tablet Take 75 mg by mouth daily.     cyclobenzaprine (FLEXERIL) 10 MG tablet Take 10 mg by mouth 4 (four) times daily as needed for muscle spasms.     ferrous sulfate 325 (65 FE) MG tablet Take 325 mg by mouth daily with breakfast.     fluconazole (DIFLUCAN) 150 MG tablet Take 1 tablet (150 mg total) by mouth daily. Take 1 tablet by mouth.  May repeat every 72 hours while symptoms persist for up to 2 additional doses. 3 tablet 0   fluticasone (FLONASE) 50 MCG/ACT nasal spray Place 2 sprays into both nostrils daily.     GARLIC PO Take 1 tablet by mouth daily.     glipiZIDE (GLUCOTROL XL) 2.5 MG 24 hr tablet Take 2.5 mg by mouth every morning.     hydrochlorothiazide (HYDRODIURIL) 12.5 MG tablet Take 12.5 mg by mouth daily.     HYDROcodone-acetaminophen (NORCO) 7.5-325 MG tablet Take 1 tablet by mouth every 6 (six) hours as needed (pain). 60 tablet 0   LEVEMIR FLEXTOUCH 100 UNIT/ML Pen Inject 40 Units into the skin at bedtime. X 6 more nights, then return back to 22 units at bedtime (Patient taking differently: Inject 32 Units into the skin every evening.) 15 mL 11   lisinopril (ZESTRIL) 5 MG tablet Take 5 mg by mouth daily.     LORazepam (ATIVAN) 1 MG tablet Take 1 mg by mouth every 6 (six) hours as needed for anxiety or sleep.  5   pantoprazole (PROTONIX) 40 MG tablet Take 1 tablet (40 mg total) by mouth 2 (two) times daily before a meal. 60 tablet 3   senna (SENOKOT) 8.6 MG TABS tablet Take 1 tablet (8.6 mg total) by mouth at bedtime as needed for mild constipation. May be needed as pain medication can constipate. 120 each 1   Sennosides 25 MG TABS Take 25 mg by mouth daily as needed (severe constipation).     sildenafil (REVATIO) 20  MG tablet Take 60 mg by mouth  daily as needed (erectile dysfunction).     sildenafil (VIAGRA) 100 MG tablet Take 100 mg by mouth daily as needed for erectile dysfunction.     tadalafil (CIALIS) 20 MG tablet Take 1 tablet (20 mg total) by mouth daily as needed for erectile dysfunction. Take 30-60 minutes prior to intercourse 20 tablet 11   Testosterone Cypionate 200 MG/ML SOLN Inject 300 mg as directed every 14 (fourteen) days.     traZODone (DESYREL) 50 MG tablet Take 50 mg by mouth at bedtime.     umeclidinium-vilanterol (ANORO ELLIPTA) 62.5-25 MCG/ACT AEPB Inhale 1 puff into the lungs daily. 1 each 5   No current facility-administered medications for this visit.    Review of Systems  Constitutional: positive for fatigue and weight loss Eyes: negative Ears, nose, mouth, throat, and face: positive for voice change Respiratory: positive for cough and sputum Cardiovascular: negative Gastrointestinal: positive for constipation Genitourinary:negative Integument/breast: negative Hematologic/lymphatic: negative Musculoskeletal:negative Neurological: negative Behavioral/Psych: negative Endocrine: negative Allergic/Immunologic: negative  Physical Exam  QTM:AUQJF, healthy, no distress, well nourished, well developed, and anxious SKIN: skin color, texture, turgor are normal, no rashes or significant lesions HEAD: Normocephalic, No masses, lesions, tenderness or abnormalities EYES: normal, PERRLA, Conjunctiva are pink and non-injected EARS: External ears normal, Canals clear OROPHARYNX:no exudate, no erythema, and lips, buccal mucosa, and tongue normal  NECK: supple, no adenopathy, no JVD LYMPH:  no palpable lymphadenopathy, no hepatosplenomegaly LUNGS: clear to auscultation , and palpation HEART: regular rate & rhythm, no murmurs, and no gallops ABDOMEN:abdomen soft, non-tender, normal bowel sounds, and no masses or organomegaly BACK: Back symmetric, no curvature., No CVA  tenderness EXTREMITIES:no joint deformities, effusion, or inflammation, no edema  NEURO: alert & oriented x 3 with fluent speech, no focal motor/sensory deficits  PERFORMANCE STATUS: ECOG 1  LABORATORY DATA: Lab Results  Component Value Date   WBC 8.4 05/20/2022   HGB 12.9 (L) 05/20/2022   HCT 39.5 05/20/2022   MCV 79.5 (L) 05/20/2022   PLT 324 05/20/2022      Chemistry      Component Value Date/Time   NA 133 (L) 05/14/2022 1326   K 5.3 (H) 05/14/2022 1326   CL 99 05/14/2022 1326   CO2 30 05/14/2022 1326   BUN 42 (H) 05/14/2022 1326   CREATININE 2.03 (H) 05/14/2022 1326      Component Value Date/Time   CALCIUM 10.2 05/14/2022 1326   ALKPHOS 60 11/05/2019 1419   AST 18 11/05/2019 1419   ALT 16 11/05/2019 1419   BILITOT 0.6 11/05/2019 1419       RADIOGRAPHIC STUDIES: MR BRAIN W WO CONTRAST  Result Date: 05/17/2022 CLINICAL DATA:  Lung cancer staging EXAM: MRI HEAD WITHOUT AND WITH CONTRAST TECHNIQUE: Multiplanar, multiecho pulse sequences of the brain and surrounding structures were obtained without and with intravenous contrast. CONTRAST:  14m GADAVIST GADOBUTROL 1 MMOL/ML IV SOLN COMPARISON:  None Available. FINDINGS: Image quality is degraded by motion artifact. Brain: There is no acute intracranial hemorrhage, extra-axial fluid collection, or acute infarct. Parenchymal volume is normal for age. The ventricles are normal in size. Gray-white differentiation is preserved. There is no significant burden of underlying white matter microangiopathic change. 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 There is no surrounding edema or mass effect. There is no other abnormal enhancement. There is no mass effect or midline shift. Vascular: Normal flow voids. Skull and upper cervical spine: Normal marrow signal.  Sinuses/Orbits: The paranasal sinuses are clear. There is remote  fracture of the right lamina papyracea. The globes and orbits are otherwise unremarkable. Other: There is a left mastoid effusion, unchanged since the CT neck from 2019. The imaged left nasopharynx is unremarkable. Postsurgical changes are noted in the left mandible and adjacent soft tissues IMPRESSION: 1. Image quality is degraded by motion artifact. 2. 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 Consider short interval follow up in 2-3 months with post contrast black blood sequences to ensure stability. 3. No other abnormal enhancement to suggest intracranial metastatic disease. Electronically Signed   By: Valetta Mole M.D.   On: 05/17/2022 08:08   DG Chest Port 1 View  Result Date: 04/30/2022 CLINICAL DATA:  Status post bronchoscopy EXAM: PORTABLE CHEST 1 VIEW COMPARISON:  Previous studies including the chest radiographs done on 03/29/2022 and PET-CT done on 04/12/2022 FINDINGS: Cardiac size is within normal limits. There is homogeneous opacity in right upper lung field consistent with central obstructing lesion with atelectasis in right upper lobe. There are no new infiltrates or signs of pulmonary edema. There is no pleural effusion or pneumothorax. Surgical clips are seen in left axilla and left side of neck. IMPRESSION: Opacification in right upper lung field has not changed. There is no pneumothorax. Electronically Signed   By: Elmer Picker M.D.   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   PLAN: I had a lengthy  discussion with the patient and his wife today about his current disease stage, prognosis and treatment options. I personally and independently reviewed the scan images and discussed the results and showed the images to the patient and his wife. I recommended for the patient a course of concurrent chemoradiation with weekly carboplatin for AUC of 2 and paclitaxel 45 Mg/M2 for 6/7 weeks.  This will be followed by consolidation immunotherapy if the patient has no evidence of disease progression after the induction phase. I discussed with the patient the adverse effect of this treatment including but not limited to alopecia, myelosuppression, nausea and vomiting, peripheral neuropathy, liver or renal dysfunction. The patient was seen by Dr. Isidore Moos and expected to start first dose of his radiation next week.  He will start the first cycle of his chemotherapy on May 31, 2022. I will arrange for the patient to have a chemotherapy education class before the first dose of his treatment. I will also refer the patient to IR for Port-A-Cath placement.  I will call his pharmacy with prescription for Compazine 10 mg p.o. every 6 hours as needed for nausea in addition to Emla cream to be applied to the Port-A-Cath site before treatment. The patient will come back for follow-up visit with the first dose of his treatment. He was advised to call immediately if he has any other concerning symptoms in the interval. The patient voices understanding of current disease status and treatment options and is in agreement with the current care plan.  All questions were answered. The patient knows to call the clinic with any problems, questions or concerns. We can certainly  see the patient much sooner if necessary.  Thank you so much for allowing me to participate in the care of MICHAELA SHANKEL. I will continue to follow up the patient with you and assist in his care.  The total time spent in the appointment was 90  minutes.  Disclaimer: This note was dictated with voice recognition software. Similar sounding words can inadvertently be transcribed and may not be corrected upon review.   Eilleen Kempf May 20, 2022, 11:13 AM

## 2022-05-20 NOTE — Progress Notes (Signed)
START ON PATHWAY REGIMEN - Non-Small Cell Lung     A cycle is every 7 days, concurrent with RT:     Paclitaxel      Carboplatin   **Always confirm dose/schedule in your pharmacy ordering system**  Patient Characteristics: Preoperative or Nonsurgical Candidate (Clinical Staging), Stage III - Nonsurgical Candidate (Nonsquamous and Squamous), PS = 0, 1 Therapeutic Status: Preoperative or Nonsurgical Candidate (Clinical Staging) AJCC T Category: cT3 AJCC N Category: cN3 AJCC M Category: cM0 AJCC 8 Stage Grouping: IIIC ECOG Performance Status: 1 Intent of Therapy: Curative Intent, Discussed with Patient

## 2022-05-21 ENCOUNTER — Other Ambulatory Visit: Payer: Self-pay

## 2022-05-23 ENCOUNTER — Other Ambulatory Visit: Payer: Self-pay | Admitting: Internal Medicine

## 2022-05-24 ENCOUNTER — Other Ambulatory Visit: Payer: Self-pay | Admitting: Radiology

## 2022-05-24 NOTE — Progress Notes (Signed)
Pharmacist Chemotherapy Monitoring - Initial Assessment    Anticipated start date: 05/31/22   The following has been reviewed per standard work regarding the patient's treatment regimen: The patient's diagnosis, treatment plan and drug doses, and organ/hematologic function Lab orders and baseline tests specific to treatment regimen  The treatment plan start date, drug sequencing, and pre-medications Prior authorization status  Patient's documented medication list, including drug-drug interaction screen and prescriptions for anti-emetics and supportive care specific to the treatment regimen The drug concentrations, fluid compatibility, administration routes, and timing of the medications to be used The patient's access for treatment and lifetime cumulative dose history, if applicable  The patient's medication allergies and previous infusion related reactions, if applicable   Changes made to treatment plan:  N/A  Follow up needed:  N/A   Judge Stall, RPH, 05/24/2022  11:56 AM

## 2022-05-25 ENCOUNTER — Encounter: Payer: Self-pay | Admitting: Internal Medicine

## 2022-05-25 ENCOUNTER — Telehealth: Payer: Self-pay | Admitting: Internal Medicine

## 2022-05-25 DIAGNOSIS — Z79899 Other long term (current) drug therapy: Secondary | ICD-10-CM | POA: Diagnosis not present

## 2022-05-25 DIAGNOSIS — C3411 Malignant neoplasm of upper lobe, right bronchus or lung: Secondary | ICD-10-CM | POA: Insufficient documentation

## 2022-05-25 DIAGNOSIS — Z5111 Encounter for antineoplastic chemotherapy: Secondary | ICD-10-CM | POA: Diagnosis present

## 2022-05-25 DIAGNOSIS — E1165 Type 2 diabetes mellitus with hyperglycemia: Secondary | ICD-10-CM | POA: Diagnosis not present

## 2022-05-25 DIAGNOSIS — F419 Anxiety disorder, unspecified: Secondary | ICD-10-CM | POA: Diagnosis not present

## 2022-05-25 NOTE — Telephone Encounter (Signed)
Scheduled per 09/28 los, patient has been called and notified of all upcoming appointments. Patient is notified.

## 2022-05-26 ENCOUNTER — Ambulatory Visit (HOSPITAL_COMMUNITY)
Admission: RE | Admit: 2022-05-26 | Discharge: 2022-05-26 | Disposition: A | Discharge status: Home / Self Care | Payer: Medicare Other | Source: Ambulatory Visit | Attending: Internal Medicine | Admitting: Internal Medicine

## 2022-05-26 ENCOUNTER — Ambulatory Visit
Admission: RE | Admit: 2022-05-26 | Discharge: 2022-05-26 | Disposition: A | Discharge status: Home / Self Care | Payer: Medicare Other | Source: Ambulatory Visit | Attending: Radiation Oncology | Admitting: Radiation Oncology

## 2022-05-26 ENCOUNTER — Other Ambulatory Visit: Payer: Self-pay

## 2022-05-26 ENCOUNTER — Encounter (HOSPITAL_COMMUNITY): Payer: Self-pay

## 2022-05-26 ENCOUNTER — Other Ambulatory Visit: Payer: Self-pay | Admitting: Student

## 2022-05-26 DIAGNOSIS — Z87891 Personal history of nicotine dependence: Secondary | ICD-10-CM | POA: Insufficient documentation

## 2022-05-26 DIAGNOSIS — C060 Malignant neoplasm of cheek mucosa: Secondary | ICD-10-CM

## 2022-05-26 DIAGNOSIS — C3411 Malignant neoplasm of upper lobe, right bronchus or lung: Secondary | ICD-10-CM | POA: Diagnosis present

## 2022-05-26 HISTORY — PX: IR IMAGING GUIDED PORT INSERTION: IMG5740

## 2022-05-26 LAB — RAD ONC ARIA SESSION SUMMARY
Course Elapsed Days: 0
Plan Fractions Treated to Date: 1
Plan Prescribed Dose Per Fraction: 2 Gy
Plan Total Fractions Prescribed: 30
Plan Total Prescribed Dose: 60 Gy
Reference Point Dosage Given to Date: 2 Gy
Reference Point Session Dosage Given: 2 Gy
Session Number: 1

## 2022-05-26 LAB — GLUCOSE, CAPILLARY: Glucose-Capillary: 159 mg/dL — ABNORMAL HIGH (ref 70–99)

## 2022-05-26 MED ORDER — FENTANYL CITRATE (PF) 100 MCG/2ML IJ SOLN
INTRAMUSCULAR | Status: AC
  Filled 2022-05-26: qty 2

## 2022-05-26 MED ORDER — SODIUM CHLORIDE 0.9 % IV SOLN: INTRAVENOUS | Status: DC

## 2022-05-26 MED ORDER — FENTANYL CITRATE (PF) 100 MCG/2ML IJ SOLN
INTRAMUSCULAR | Status: AC | PRN
  Administered 2022-05-26: 50 ug via INTRAVENOUS

## 2022-05-26 MED ORDER — HEPARIN SOD (PORK) LOCK FLUSH 100 UNIT/ML IV SOLN
INTRAVENOUS | Status: AC | PRN
  Administered 2022-05-26: 500 [IU] via INTRAVENOUS

## 2022-05-26 MED ORDER — MIDAZOLAM HCL 2 MG/2ML IJ SOLN
INTRAMUSCULAR | Status: AC
  Filled 2022-05-26: qty 2

## 2022-05-26 MED ORDER — LIDOCAINE-EPINEPHRINE 1 %-1:100000 IJ SOLN
INTRAMUSCULAR | Status: AC | PRN
  Administered 2022-05-26: 5 mL via INTRADERMAL

## 2022-05-26 MED ORDER — LIDOCAINE-EPINEPHRINE 1 %-1:100000 IJ SOLN
INTRAMUSCULAR | Status: AC
  Filled 2022-05-26: qty 1

## 2022-05-26 MED ORDER — FENTANYL CITRATE (PF) 100 MCG/2ML IJ SOLN
INTRAMUSCULAR | Status: AC | PRN
  Administered 2022-05-26: 25 ug via INTRAVENOUS

## 2022-05-26 MED ORDER — MIDAZOLAM HCL 2 MG/2ML IJ SOLN
INTRAMUSCULAR | Status: AC | PRN
  Administered 2022-05-26: 1 mg via INTRAVENOUS

## 2022-05-26 MED ORDER — NALOXONE HCL 0.4 MG/ML IJ SOLN
INTRAMUSCULAR | Status: AC
  Filled 2022-05-26: qty 1

## 2022-05-26 MED ORDER — HEPARIN SOD (PORK) LOCK FLUSH 100 UNIT/ML IV SOLN
INTRAVENOUS | Status: AC
  Filled 2022-05-26: qty 5

## 2022-05-26 MED ORDER — FLUMAZENIL 0.5 MG/5ML IV SOLN
INTRAVENOUS | Status: AC
  Filled 2022-05-26: qty 5

## 2022-05-26 NOTE — Progress Notes (Signed)
The pharmacy team has substituted IV diphenhydramine for IV cetirizine as a premedication. Patient will be monitored for hypersensitivity reaction and adverse reactions to IV cetirizine. Thanks.   Kennith Center, Pharm.D., CPP 05/26/2022@4 :36 PM

## 2022-05-26 NOTE — H&P (Signed)
Chief Complaint: Patient was seen in consultation today for right upper lobe lung cancer  Referring Physician(s): Mohamed,Mohamed  Supervising Physician: Jacqulynn Cadet  Patient Status: [facility] - Out-pt  History of Present Illness: Colin Wagner is a 71 y.o. male with past medical history of oropharyngeal cancer s/p resection and radiation in 05/2017 who now presents with recent diagnosis of right upper lobe lung mass. He is in need of durable venous access to initiate chemotherapy.  He is also getting concurrent radiation with next session expected tomorrow (10/5).   Colin Wagner presents in his usual state of health today. Denies fever,chills, nausea, vomiting, abdominal pain. States his speech and oral function are at baseline.  He lives with a friend who is accompanying him today.  She will be taking transportation home with him for post-procedure care and supervision. He has been NPO. He is agreeable to Port-A-Cath placement.   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    Right shoulder pain    Testicular hypofunction     Past Surgical History:  Procedure Laterality Date   BALLOON DILATION  12/31/2021   Procedure: BALLOON DILATION;  Surgeon:  [name], MD;  [facility];  Service: Endoscopy;;   BIOPSY  04/30/2022   Procedure: BIOPSY;  Surgeon: Maryjane Hurter, MD;  Location: [facility];  Service: Pulmonary;;   CAROTID STENT  06/2018   COLONOSCOPY  2003   Dr. [name], polyps   COLONOSCOPY  2005   Dr. [name], multiple diverticula   COLONOSCOPY  2008   Dr. [name], diverticulosis   COLONOSCOPY WITH PROPOFOL N/A 10/21/2016   Dr. [name]: Diverticulosis, two 5-7 mm polyps removed. path-tubular adenomas.  Next colonoscopy in 5 years.   COLONOSCOPY WITH PROPOFOL N/A 12/31/2021   Procedure: COLONOSCOPY WITH PROPOFOL;  Surgeon: [name], MD;  [facility];  Service: Endoscopy;  Laterality: N/A;  8:15am   ENDOBRONCHIAL ULTRASOUND Right 04/30/2022   Procedure: ENDOBRONCHIAL ULTRASOUND;  Surgeon: Maryjane Hurter, MD;  [facility];  Service: Pulmonary;  Laterality: Right;   ESOPHAGOGASTRODUODENOSCOPY     Multiple EGDs. 1999 EGD showed gastric ulcers, no H pylori and benign biopsies performed by Dr. [name]. 2001 gastric ulcer healed. Last EGD 2005 had gastritis.   ESOPHAGOGASTRODUODENOSCOPY (EGD) WITH PROPOFOL N/A 12/31/2021   Procedure: ESOPHAGOGASTRODUODENOSCOPY (EGD) WITH PROPOFOL;  Surgeon: [name], MD;  [facility];  Service: Endoscopy;  Laterality: N/A;   FINE NEEDLE ASPIRATION  04/30/2022   Procedure: FINE NEEDLE ASPIRATION (FNA) LINEAR;  Surgeon: Maryjane Hurter, MD;  Location: [facility];  Service: Pulmonary;;   IR GASTROSTOMY TUBE REMOVAL  02/08/2020   IR REPLACE G-TUBE SIMPLE WO FLUORO  03/20/2019   IR REPLACE G-TUBE SIMPLE WO FLUORO  09/21/2019   IR REPLC GASTRO/COLONIC TUBE PERCUT W/FLUORO  05/16/2018   Left partial mandibulectomy, Scapular free flap reconstruction, selective neck dissection, tracheotomy, and resection of intraoral palate cancer. Left 03/01/2017   [facility]   LUNG BIOPSY     MOUTH SURGERY     PARTIAL  NEPHRECTOMY Left 2012   POLYPECTOMY  10/21/2016   Procedure: POLYPECTOMY;  Surgeon: [name], MD;  [facility];  Service: Endoscopy;;  hepatic flexure x2   POLYPECTOMY  12/31/2021   Procedure: POLYPECTOMY INTESTINAL;  Surgeon: [name], MD;  [facility];  Service: Endoscopy;;   SURGERY OF LIP  06/2019   lip revision   TRANSURETHRAL RESECTION OF PROSTATE N/A 03/01/2019   Procedure: TRANSURETHRAL RESECTION OF THE PROSTATE (TURP)WITH CYSTOSCOPY;  Surgeon: Raynelle Bring, MD;  Location: [facility];  Service: Urology;  Laterality: N/A;   VIDEO BRONCHOSCOPY Right 04/30/2022   Procedure: VIDEO BRONCHOSCOPY WITHOUT FLUORO;  Surgeon: Maryjane Hurter, MD;  [facility];  Service: Pulmonary;  Laterality: Right;    Allergies: Patient has no known allergies.  Medications: Prior to Admission medications   Medication Sig Start Date End Date Taking? Authorizing Provider  albuterol (VENTOLIN HFA) 108 (90 Base) MCG/ACT inhaler Inhale 2 puffs into the lungs every 4 (four) hours as needed for wheezing or shortness of breath. 03/29/22  Yes [provider]  amLODipine (NORVASC) 5 MG tablet Take 5 mg by mouth daily. 12/02/20  Yes [provider]  aspirin EC 81 MG tablet Take 81 mg by mouth daily.   Yes [provider]  atorvastatin (LIPITOR) 40 MG tablet Take 40 mg by mouth daily. 02/14/17  Yes [provider]  cholecalciferol (VITAMIN D) 25 MCG (1000 UNIT) tablet Take 1,000 Units by mouth daily.   Yes [provider]  clopidogrel (PLAVIX) 75 MG tablet Take 75 mg by mouth daily. 05/27/19  Yes [provider]  cyclobenzaprine (FLEXERIL) 10 MG tablet Take 10 mg by mouth 4 (four) times daily as needed for muscle spasms. 09/06/18  Yes [provider]  ferrous sulfate 325 (65 FE) MG tablet Take 325 mg by mouth daily with breakfast.   Yes [provider]  fluticasone (FLONASE) 50 MCG/ACT nasal spray Place 2 sprays  into both nostrils daily.   Yes [provider]  GARLIC PO Take 1 tablet by mouth daily.   Yes [provider]  glipiZIDE (GLUCOTROL XL) 2.5 MG 24 hr tablet Take 2.5 mg by mouth every morning. 08/11/18  Yes [provider]  hydrochlorothiazide (HYDRODIURIL) 12.5 MG tablet Take 12.5 mg by mouth daily. 04/24/19  Yes [provider]  HYDROcodone-acetaminophen (NORCO) 7.5-325 MG tablet Take 1 tablet by mouth every 6 (six) hours as needed (pain). 01/12/17  Yes [name], MD  LEVEMIR FLEXTOUCH 100 UNIT/ML Pen Inject 40 Units into the skin at bedtime. X 6 more nights, then return back to 22 units at bedtime Patient taking differently: Inject 32 Units into the skin every evening. 07/31/19  Yes Tat, Shanon Brow, MD  lisinopril (ZESTRIL) 5 MG tablet Take 5 mg by mouth daily.   Yes [provider]  LORazepam (ATIVAN) 1 MG tablet Take 1 mg by mouth every 6 (six) hours as needed for  anxiety or sleep. 03/31/15  Yes [provider]  pantoprazole (PROTONIX) 40 MG tablet Take 1 tablet (40 mg total) by mouth 2 (two) times daily before a meal. 04/06/22  Yes Rourk, Cristopher Estimable, MD  senna (SENOKOT) 8.6 MG TABS tablet Take 1 tablet (8.6 mg total) by mouth at bedtime as needed for mild constipation. May be needed as pain medication can constipate. 01/12/17  Yes [name], MD  sildenafil (REVATIO) 20 MG tablet Take 60 mg by mouth daily as needed (erectile dysfunction).   Yes [provider]  sildenafil (VIAGRA) 100 MG tablet Take 100 mg by mouth daily as needed for erectile dysfunction.   Yes [provider]  traZODone (DESYREL) 50 MG tablet Take 50 mg by mouth at bedtime.   Yes [provider]  umeclidinium-vilanterol (ANORO ELLIPTA) 62.5-25 MCG/ACT AEPB Inhale 1 puff into the lungs daily. 05/19/22  Yes Spero Geralds, MD  acetaminophen (TYLENOL) 500 MG tablet Take 1,000 mg by mouth every 6 (six) hours as needed for mild pain or moderate pain.     [provider]  lidocaine-prilocaine (EMLA) cream Apply to the Port-A-Cath site 30-60 minutes before treatment. 05/20/22   Curt Bears, MD  prochlorperazine (COMPAZINE) 10 MG tablet Take 1 tablet (10 mg total) by mouth every 6 (six) hours as needed for nausea or vomiting. 05/20/22   Curt Bears, MD  Sennosides 25 MG TABS Take 25 mg by mouth daily as needed (severe constipation).    [provider]  tadalafil (CIALIS) 20 MG tablet Take 1 tablet (20 mg total) by mouth daily as needed for erectile dysfunction. Take 30-60 minutes prior to intercourse 11/19/21   Primus Bravo., MD  Testosterone Cypionate 200 MG/ML SOLN Inject 300 mg as directed every 14 (fourteen) days.    [provider]     Family History  Problem Relation Age of Onset   Hypertension Mother    Colon cancer Neg Hx     Social History   Socioeconomic History   Marital status: Divorced    Spouse name: Not on file   Number of children: 0   Years of education: Not on file   Highest education level: Not on file  Occupational History   Not on file  Tobacco Use   Smoking status: Former    Packs/day: 0.50    Years: 35.00    Total pack years: 17.50    Types: Cigarettes    Quit date: 03/01/2017    Years since quitting: 5.2   Smokeless tobacco: Never   Tobacco comments:    quit 02-2017  Vaping Use   Vaping Use: Never used  Substance and Sexual Activity   Alcohol use: No    Comment: quit 2000 but relapse in 2016. no etoh since hospitalized 2016.    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   Patient with a history of smoking half pack a day for 35+ years. Patient currently only smoking a few cigarettes per day.   The patient has history of alcohol abuse. Patient quit in 2000 and then relapsed in 2016. Patient has not had any alcohol since 2016.    Social Determinants of Health   Financial Resource Strain: Low Risk  (07/27/2019)   Overall Financial Resource Strain (CARDIA)    Difficulty of Paying Living Expenses: Not hard  at all  Food Insecurity: No Food Insecurity (07/27/2019)   Hunger Vital Sign    Worried About Running Out of Food in the Last Year: Never true    Ran Out of Food in the Last Year: Never true  Transportation Needs: No Transportation Needs (09/08/2018)   PRAPARE - Hydrologist (Medical): No    Lack of Transportation (Non-Medical): No  Physical Activity: Inactive (07/27/2019)   Exercise Vital Sign    Days of Exercise per Week: 0 days    Minutes of Exercise per Session: 0 min  Stress: No Stress Concern Present (07/27/2019)   [place]    Feeling of Stress : Not at all  Social Connections: Unknown (07/27/2019)   Social Connection and Isolation Panel [NHANES]    Frequency of Communication with Friends and Family: More than three times a week    Frequency of Social Gatherings with Friends and Family: More than three times a week    Attends Religious Services: More than 4 times per year    Active Member of [employer] or Organizations: Yes    Attends Music therapist: More than 4 times per year    Marital Status: Not on file     Review of Systems: A 12 point ROS discussed and pertinent positives are indicated in the HPI above.  All other systems are negative.  Review of Systems  Constitutional:  Negative for fatigue and fever.  Respiratory:  Negative for cough and shortness of breath.   Cardiovascular:  Negative for chest pain.  Gastrointestinal:  Negative for abdominal pain, diarrhea, nausea and vomiting.  Musculoskeletal:  Negative for back pain.  Psychiatric/Behavioral:  Negative for behavioral problems and confusion.     Vital Signs: BP (!) 152/81   Pulse 80   Temp 98.6 F (37 C) (Oral)   Resp 15    SpO2 99%   Physical Exam Vitals and nursing note reviewed.  Constitutional:      General: He is not in acute distress.    Appearance: Normal appearance. He is not ill-appearing.  Neck:     Comments: Oropharyngeal resection, scar visible.  Slurred speech.  Cardiovascular:     Rate and Rhythm: Normal rate and regular rhythm.  Pulmonary:     Effort: Pulmonary effort is normal.     Breath sounds: Normal breath sounds.  Abdominal:     General: Abdomen is flat.     Palpations: Abdomen is soft.  Skin:    General: Skin is warm and dry.  Neurological:     General: No focal deficit present.     Mental Status: He is alert and oriented to person, place, and time. Mental status is at baseline.  Psychiatric:        Mood and Affect: Mood normal.        Behavior: Behavior normal.        Thought Content: Thought content normal.        Judgment: Judgment normal.          Imaging: MR BRAIN W WO CONTRAST  Result Date: 05/17/2022 CLINICAL DATA:  Lung cancer staging EXAM: MRI HEAD WITHOUT AND WITH CONTRAST TECHNIQUE: Multiplanar, multiecho pulse sequences of the brain and surrounding structures were obtained without and with intravenous contrast. CONTRAST:  51mL GADAVIST GADOBUTROL 1 MMOL/ML IV SOLN COMPARISON:  None Available. FINDINGS: Image quality is degraded by motion artifact. Brain: There is no acute intracranial hemorrhage, extra-axial fluid  collection, or acute infarct. Parenchymal volume is normal for age. The ventricles are normal in size. Gray-white differentiation is preserved. There is no significant burden of underlying white matter microangiopathic change. 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 There is no surrounding edema or mass effect. There is no other abnormal enhancement. There is no mass effect or midline shift. Vascular: Normal flow voids. Skull and upper cervical  spine: Normal marrow signal. Sinuses/Orbits: The paranasal sinuses are clear. There is remote fracture of the right lamina papyracea. The globes and orbits are otherwise unremarkable. Other: There is a left mastoid effusion, unchanged since the CT neck from 2019. The imaged left nasopharynx is unremarkable. Postsurgical changes are noted in the left mandible and adjacent soft tissues IMPRESSION: 1. Image quality is degraded by motion artifact. 2. 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 Consider short interval follow up in 2-3 months with post contrast black blood sequences to ensure stability. 3. No other abnormal enhancement to suggest intracranial metastatic disease. Electronically Signed   By: Valetta Mole M.D.   On: 05/17/2022 08:08   DG Chest Port 1 View  Result Date: 04/30/2022 CLINICAL DATA:  Status post bronchoscopy EXAM: PORTABLE CHEST 1 VIEW COMPARISON:  Previous studies including the chest radiographs done on 03/29/2022 and PET-CT done on 04/12/2022 FINDINGS: Cardiac size is within normal limits. There is homogeneous opacity in right upper lung field consistent with central obstructing lesion with atelectasis in right upper lobe. There are no new infiltrates or signs of pulmonary edema. There is no pleural effusion or pneumothorax. Surgical clips are seen in left axilla and left side of neck. IMPRESSION: Opacification in right upper lung field has not changed. There is no pneumothorax. Electronically Signed   By: Elmer Picker M.D.   On: 04/30/2022 14:08    Labs:  CBC: Recent Labs    12/29/21 0819 05/20/22 1052  WBC 7.2 8.4  HGB 13.7 12.9*  HCT 43.1 39.5  PLT 294 324    COAGS: No results for input(s): "INR", "APTT" in the last 8760 hours.  BMP: Recent Labs    12/29/21 0819 05/14/22 1326 05/20/22 1052  NA 135 133* 135  K 4.2 5.3* 5.5*  CL 102 99 98  CO2 24 30 30    GLUCOSE 277* 216* 212*  BUN 43* 42* 40*  CALCIUM 9.1 10.2 9.6  CREATININE 1.77* 2.03* 1.65*  GFRNONAA 41* 34* 44*    LIVER FUNCTION TESTS: Recent Labs    05/20/22 1052  BILITOT 0.4  AST 9*  ALT 7  ALKPHOS 67  PROT 7.7  ALBUMIN 3.9    TUMOR MARKERS: No results for input(s): "AFPTM", "CEA", "CA199", "CHROMGRNA" in the last 8760 hours.  Assessment and Plan: Patient with past medical history of right upper lobe lung cancer presents with plans for upcoming chemotherapy. He is in need of durable venous access.  IR consulted for Port-A-Cath placement at the request of Dr. Julien Nordmann. Case reviewed by Dr. Laurence Ferrari who approves patient for procedure.  Patient presents today in their usual state of health.  He has been NPO and is not currently on blood thinners.   Risks and benefits of image guided port-a-catheter placement was discussed with the patient including, but not limited to bleeding, infection, pneumothorax, or fibrin sheath development and need for additional procedures.  All of the patient's  questions were answered, patient is agreeable to proceed. Consent signed and in chart.    Thank you for this interesting consult.  I greatly enjoyed meeting OSIE AMPARO and look forward to participating in their care.  A copy of this report was sent to the requesting provider on this date.  Electronically Signed: Docia Barrier, PA 05/26/2022, 12:42 PM   I spent a total of  30 Minutes   in face to face in clinical consultation, greater than 50% of which was counseling/coordinating care for right upper lobe lung cancer.

## 2022-05-26 NOTE — Discharge Instructions (Signed)
For questions /concerns may call Interventional Radiology at [phone] or  Interventional Radiology clinic [phone]   You may remove your dressing and shower tomorrow afternoon  DO NOT use EMLA cream for 2 weeks after port placement as the cream will remove surgical glue on your incision.    Implanted Port Insertion, Care After This sheet gives you information about how to care for yourself after your procedure. Your health care provider may also give you more specific instructions. If you have problems or questions, contact your health careprovider. What can I expect after the procedure? After the procedure, it is common to have: Discomfort at the port insertion site. Bruising on the skin over the port. This should improve over 3-4 days. Follow these instructions at home: [facility] care After your port is placed, you will get a manufacturer's information card. The card has information about your port. Keep this card with you at all times. Take care of the port as told by your health care provider. Ask your health care provider if you or a family member can get training for taking care of the port at home. A home health care nurse may also take care of the port. Make sure to remember what type of port you have. Incision care Follow instructions from your health care provider about how to take care of your port insertion site. Make sure you: Wash your hands with soap and water before and after you change your bandage (dressing). If soap and water are not available, use hand sanitizer. Change your dressing as told by your health care provider. Leave skin glue, or adhesive strips in place. These skin closures may need to stay in place for 2 weeks or longer.  Check your port insertion site every day for signs of infection. Check for:      - Redness, swelling, or pain.                     - Fluid or blood.      - Warmth.      - Pus or a bad smell. Activity Return to your normal activities as  told by your health care provider. Ask your health care provider what activities are safe for you. Do not lift anything that is heavier than 10 lb (4.5 kg), or the limit that you are told, until your health care provider says that it is safe. General instructions Take over-the-counter and prescription medicines only as told by your health care provider. Do not take baths, swim, or use a hot tub until your health care provider approves. Ask your health care provider if you may take showers. You may only be allowed to take sponge baths. Do not drive for 24 hours if you were given a sedative during your procedure. Wear a medical alert bracelet in case of an emergency. This will tell any health care providers that you have a port. Keep all follow-up visits as told by your health care provider. This is important. Contact a health care provider if: You cannot flush your port with saline as directed, or you cannot draw blood from the port. You have a fever or chills. You have redness, swelling, or pain around your port insertion site. You have fluid or blood coming from your port insertion site. Your port insertion site feels warm to the touch. You have pus or a bad smell coming from the port insertion site. Get help right away if: You have chest pain or shortness of  breath. You have bleeding from your port that you cannot control. Summary Take care of the port as told by your health care provider. Keep the manufacturer's information card with you at all times. Change your dressing as told by your health care provider. Contact a health care provider if you have a fever or chills or if you have redness, swelling, or pain around your port insertion site. Keep all follow-up visits as told by your health care provider. This information is not intended to replace advice given to you by your health care provider. Make sure you discuss any questions you have with your healthcare provider. Document Revised:  03/07/2018 Document Reviewed: 03/07/2018   Moderate Conscious Sedation, Adult, Care After This sheet gives you information about how to care for yourself after your procedure. Your health care provider may also give you more specific instructions. If you have problems or questions, contact your health careprovider. What can I expect after the procedure? After the procedure, it is common to have: Sleepiness for several hours. Impaired judgment for several hours. Difficulty with balance. Vomiting if you eat too soon. Follow these instructions at home: For the time period you were told by your health care provider: Rest. Do not participate in activities where you could fall or become injured. Do not drive or use machinery. Do not drink alcohol. Do not take sleeping pills or medicines that cause drowsiness. Do not make important decisions or sign legal documents. Do not take care of children on your own. Eating and drinking  Follow the diet recommended by your health care provider. Drink enough fluid to keep your urine pale yellow. If you vomit: Drink water, juice, or soup when you can drink without vomiting. Make sure you have little or no nausea before eating solid foods.  General instructions Take over-the-counter and prescription medicines only as told by your health care provider. Have a responsible adult stay with you for the time you are told. It is important to have someone help care for you until you are awake and alert. Do not smoke. Keep all follow-up visits as told by your health care provider. This is important. Contact a health care provider if: You are still sleepy or having trouble with balance after 24 hours. You feel light-headed. You keep feeling nauseous or you keep vomiting. You develop a rash. You have a fever. You have redness or swelling around the IV site. Get help right away if: You have trouble breathing. You have new-onset confusion at  home. Summary After the procedure, it is common to feel sleepy, have impaired judgment, or feel nauseous if you eat too soon. Rest after you get home. Know the things you should not do after the procedure. Follow the diet recommended by your health care provider and drink enough fluid to keep your urine pale yellow. Get help right away if you have trouble breathing or new-onset confusion at home. This information is not intended to replace advice given to you by your health care provider. Make sure you discuss any questions you have with your healthcare provider. Document Revised: 12/07/2019 Document Reviewed: 07/05/2019 Elsevier Patient Education  2022 [employer].

## 2022-05-26 NOTE — Procedures (Signed)
Interventional Radiology Procedure Note  Procedure: Placement of a LEFT IJ approach single lumen PowerPort.  Tip is positioned at the superior cavoatrial junction and catheter is ready for immediate use.  Complications: No immediate Recommendations:  - Ok to shower tomorrow - Do not submerge for 7 days - Routine line care   Signed,  Criselda Peaches, MD

## 2022-05-27 ENCOUNTER — Inpatient Hospital Stay: Payer: Medicare Other | Attending: Internal Medicine

## 2022-05-27 ENCOUNTER — Ambulatory Visit
Admission: RE | Admit: 2022-05-27 | Discharge: 2022-05-27 | Disposition: A | Discharge status: Home / Self Care | Payer: Medicare Other | Source: Ambulatory Visit | Attending: Radiation Oncology | Admitting: Radiation Oncology

## 2022-05-27 ENCOUNTER — Other Ambulatory Visit: Payer: Self-pay

## 2022-05-27 DIAGNOSIS — C3411 Malignant neoplasm of upper lobe, right bronchus or lung: Secondary | ICD-10-CM | POA: Insufficient documentation

## 2022-05-27 DIAGNOSIS — E1165 Type 2 diabetes mellitus with hyperglycemia: Secondary | ICD-10-CM | POA: Insufficient documentation

## 2022-05-27 DIAGNOSIS — Z79899 Other long term (current) drug therapy: Secondary | ICD-10-CM | POA: Insufficient documentation

## 2022-05-27 DIAGNOSIS — Z5111 Encounter for antineoplastic chemotherapy: Secondary | ICD-10-CM | POA: Diagnosis not present

## 2022-05-27 DIAGNOSIS — F419 Anxiety disorder, unspecified: Secondary | ICD-10-CM | POA: Insufficient documentation

## 2022-05-27 LAB — RAD ONC ARIA SESSION SUMMARY
Course Elapsed Days: 1
Plan Fractions Treated to Date: 2
Plan Prescribed Dose Per Fraction: 2 Gy
Plan Total Fractions Prescribed: 30
Plan Total Prescribed Dose: 60 Gy
Reference Point Dosage Given to Date: 4 Gy
Reference Point Session Dosage Given: 2 Gy
Session Number: 2

## 2022-05-28 ENCOUNTER — Ambulatory Visit
Admission: RE | Admit: 2022-05-28 | Discharge: 2022-05-28 | Disposition: A | Discharge status: Home / Self Care | Payer: Medicare Other | Source: Ambulatory Visit | Attending: Radiation Oncology | Admitting: Radiation Oncology

## 2022-05-28 ENCOUNTER — Other Ambulatory Visit: Payer: Self-pay

## 2022-05-28 DIAGNOSIS — Z5111 Encounter for antineoplastic chemotherapy: Secondary | ICD-10-CM | POA: Diagnosis not present

## 2022-05-28 LAB — RAD ONC ARIA SESSION SUMMARY
Course Elapsed Days: 2
Plan Fractions Treated to Date: 3
Plan Prescribed Dose Per Fraction: 2 Gy
Plan Total Fractions Prescribed: 30
Plan Total Prescribed Dose: 60 Gy
Reference Point Dosage Given to Date: 6 Gy
Reference Point Session Dosage Given: 2 Gy
Session Number: 3

## 2022-05-28 MED FILL — Dexamethasone Sodium Phosphate Inj 100 MG/10ML: INTRAMUSCULAR | Qty: 1 | Status: AC

## 2022-05-31 ENCOUNTER — Ambulatory Visit
Admission: RE | Admit: 2022-05-31 | Discharge: 2022-05-31 | Disposition: A | Discharge status: Home / Self Care | Payer: Medicare Other | Source: Ambulatory Visit | Attending: Radiation Oncology | Admitting: Radiation Oncology

## 2022-05-31 ENCOUNTER — Encounter: Payer: Self-pay | Admitting: Internal Medicine

## 2022-05-31 ENCOUNTER — Inpatient Hospital Stay: Payer: Medicare Other

## 2022-05-31 ENCOUNTER — Inpatient Hospital Stay (HOSPITAL_BASED_OUTPATIENT_CLINIC_OR_DEPARTMENT_OTHER): Payer: Medicare Other | Admitting: Internal Medicine

## 2022-05-31 ENCOUNTER — Other Ambulatory Visit: Payer: Self-pay

## 2022-05-31 ENCOUNTER — Encounter: Payer: Self-pay | Admitting: *Deleted

## 2022-05-31 ENCOUNTER — Other Ambulatory Visit: Payer: Medicaid Other

## 2022-05-31 ENCOUNTER — Other Ambulatory Visit: Payer: Self-pay | Admitting: Medical Oncology

## 2022-05-31 VITALS — BP 152/81 | HR 103 | Temp 98.6°F | Resp 18

## 2022-05-31 DIAGNOSIS — C3411 Malignant neoplasm of upper lobe, right bronchus or lung: Secondary | ICD-10-CM

## 2022-05-31 DIAGNOSIS — Z5111 Encounter for antineoplastic chemotherapy: Secondary | ICD-10-CM

## 2022-05-31 LAB — CMP (CANCER CENTER ONLY)
ALT: 7 U/L (ref 0–44)
AST: 9 U/L — ABNORMAL LOW (ref 15–41)
Albumin: 3.5 g/dL (ref 3.5–5.0)
Alkaline Phosphatase: 64 U/L (ref 38–126)
Anion gap: 6 (ref 5–15)
BUN: 22 mg/dL (ref 8–23)
CO2: 27 mmol/L (ref 22–32)
Calcium: 9 mg/dL (ref 8.9–10.3)
Chloride: 99 mmol/L (ref 98–111)
Creatinine: 1.81 mg/dL — ABNORMAL HIGH (ref 0.61–1.24)
GFR, Estimated: 39 mL/min — ABNORMAL LOW (ref >60)
Glucose, Bld: 238 mg/dL — ABNORMAL HIGH (ref 70–99)
Potassium: 4.3 mmol/L (ref 3.5–5.1)
Sodium: 132 mmol/L — ABNORMAL LOW (ref 135–145)
Total Bilirubin: 0.3 mg/dL (ref 0.3–1.2)
Total Protein: 7.1 g/dL (ref 6.5–8.1)

## 2022-05-31 LAB — RAD ONC ARIA SESSION SUMMARY
Course Elapsed Days: 5
Plan Fractions Treated to Date: 4
Plan Prescribed Dose Per Fraction: 2 Gy
Plan Total Fractions Prescribed: 30
Plan Total Prescribed Dose: 60 Gy
Reference Point Dosage Given to Date: 8 Gy
Reference Point Session Dosage Given: 2 Gy
Session Number: 4

## 2022-05-31 LAB — CBC WITH DIFFERENTIAL (CANCER CENTER ONLY)
Abs Immature Granulocytes: 0.04 10*3/uL (ref 0.00–0.07)
Basophils Absolute: 0 10*3/uL (ref 0.0–0.1)
Basophils Relative: 0 %
Eosinophils Absolute: 0.1 10*3/uL (ref 0.0–0.5)
Eosinophils Relative: 1 %
HCT: 36 % — ABNORMAL LOW (ref 39.0–52.0)
Hemoglobin: 11.8 g/dL — ABNORMAL LOW (ref 13.0–17.0)
Immature Granulocytes: 0 %
Lymphocytes Relative: 3 %
Lymphs Abs: 0.3 10*3/uL — ABNORMAL LOW (ref 0.7–4.0)
MCH: 25.9 pg — ABNORMAL LOW (ref 26.0–34.0)
MCHC: 32.8 g/dL (ref 30.0–36.0)
MCV: 78.9 fL — ABNORMAL LOW (ref 80.0–100.0)
Monocytes Absolute: 0.5 10*3/uL (ref 0.1–1.0)
Monocytes Relative: 5 %
Neutro Abs: 9 10*3/uL — ABNORMAL HIGH (ref 1.7–7.7)
Neutrophils Relative %: 91 %
Platelet Count: 320 10*3/uL (ref 150–400)
RBC: 4.56 MIL/uL (ref 4.22–5.81)
RDW: 16.2 % — ABNORMAL HIGH (ref 11.5–15.5)
WBC Count: 10 10*3/uL (ref 4.0–10.5)
nRBC: 0 % (ref 0.0–0.2)

## 2022-05-31 MED ORDER — CETIRIZINE HCL 10 MG/ML IV SOLN
10.0000 mg | Freq: Once | INTRAVENOUS | Status: AC
  Administered 2022-05-31: 10 mg via INTRAVENOUS
  Filled 2022-05-31: qty 1

## 2022-05-31 MED ORDER — SODIUM CHLORIDE 0.9 % IV SOLN
10.0000 mg | Freq: Once | INTRAVENOUS | Status: AC
  Administered 2022-05-31: 10 mg via INTRAVENOUS
  Filled 2022-05-31: qty 10

## 2022-05-31 MED ORDER — SODIUM CHLORIDE 0.9% FLUSH
10.0000 mL | INTRAVENOUS | Status: DC | PRN
  Administered 2022-05-31: 10 mL via INTRAVENOUS

## 2022-05-31 MED ORDER — SODIUM CHLORIDE 0.9 % IV SOLN
45.0000 mg/m2 | Freq: Once | INTRAVENOUS | Status: AC
  Administered 2022-05-31: 90 mg via INTRAVENOUS
  Filled 2022-05-31: qty 15

## 2022-05-31 MED ORDER — HEPARIN SOD (PORK) LOCK FLUSH 100 UNIT/ML IV SOLN
500.0000 [IU] | Freq: Once | INTRAVENOUS | Status: AC | PRN
  Administered 2022-05-31: 500 [IU]

## 2022-05-31 MED ORDER — SODIUM CHLORIDE 0.9% FLUSH
10.0000 mL | INTRAVENOUS | Status: DC | PRN
  Administered 2022-05-31: 10 mL

## 2022-05-31 MED ORDER — SONAFINE EX EMUL
1.0000 | Freq: Two times a day (BID) | CUTANEOUS | Status: DC
  Administered 2022-05-31: 1 via TOPICAL

## 2022-05-31 MED ORDER — PALONOSETRON HCL INJECTION 0.25 MG/5ML
0.2500 mg | Freq: Once | INTRAVENOUS | Status: AC
  Administered 2022-05-31: 0.25 mg via INTRAVENOUS
  Filled 2022-05-31: qty 5

## 2022-05-31 MED ORDER — SODIUM CHLORIDE 0.9 % IV SOLN: Freq: Once | INTRAVENOUS | Status: AC

## 2022-05-31 MED ORDER — SODIUM CHLORIDE 0.9 % IV SOLN
146.8000 mg | Freq: Once | INTRAVENOUS | Status: AC
  Administered 2022-05-31: 150 mg via INTRAVENOUS
  Filled 2022-05-31: qty 15

## 2022-05-31 MED ORDER — FAMOTIDINE IN NACL 20-0.9 MG/50ML-% IV SOLN
20.0000 mg | Freq: Once | INTRAVENOUS | Status: AC
  Administered 2022-05-31: 20 mg via INTRAVENOUS
  Filled 2022-05-31: qty 50

## 2022-05-31 NOTE — Patient Instructions (Signed)
[facility]  Discharge Instructions: Thank you for choosing [facility] to provide your oncology and hematology care.   If you have a lab appointment with the [facility], please go directly to the [facility] and check in at the registration area.   Wear comfortable clothing and clothing appropriate for easy access to any Portacath or PICC line.   We strive to give you quality time with your provider. You may need to reschedule your appointment if you arrive late (15 or more minutes).  Arriving late affects you and other patients whose appointments are after yours.  Also, if you miss three or more appointments without notifying the office, you may be dismissed from the clinic at the provider's discretion.      For prescription refill requests, have your pharmacy contact our office and allow 72 hours for refills to be completed.    Today you received the following chemotherapy and/or immunotherapy agents Taxol/Carbo      To help prevent nausea and vomiting after your treatment, we encourage you to take your nausea medication as directed.  BELOW ARE SYMPTOMS THAT SHOULD BE REPORTED IMMEDIATELY: *FEVER GREATER THAN 100.4 F (38 C) OR HIGHER *CHILLS OR SWEATING *NAUSEA AND VOMITING THAT IS NOT CONTROLLED WITH YOUR NAUSEA MEDICATION *UNUSUAL SHORTNESS OF BREATH *UNUSUAL BRUISING OR BLEEDING *URINARY PROBLEMS (pain or burning when urinating, or frequent urination) *BOWEL PROBLEMS (unusual diarrhea, constipation, pain near the anus) TENDERNESS IN MOUTH AND THROAT WITH OR WITHOUT PRESENCE OF ULCERS (sore throat, sores in mouth, or a toothache) UNUSUAL RASH, SWELLING OR PAIN  UNUSUAL VAGINAL DISCHARGE OR ITCHING   Items with * indicate a potential emergency and should be followed up as soon as possible or go to the Emergency Department if any problems should occur.  Please show the CHEMOTHERAPY ALERT CARD or IMMUNOTHERAPY ALERT CARD at check-in to  the Emergency Department and triage nurse.  Should you have questions after your visit or need to cancel or reschedule your appointment, please contact [facility]  Dept: [phone]  and follow the prompts.  Office hours are 8:00 a.m. to 4:30 p.m. Monday - Friday. Please note that voicemails left after 4:00 p.m. may not be returned until the following business day.  We are closed weekends and major holidays. You have access to a nurse at all times for urgent questions. Please call the main number to the clinic Dept: [phone] and follow the prompts.   For any non-urgent questions, you may also contact your provider using MyChart. We now offer e-Visits for anyone 27 and older to request care online for non-urgent symptoms. For details visit [email or web address].   Also download the MyChart app! Go to the app store, search "MyChart", open the app, select [facility], and log in with your MyChart username and password.  Masks are optional in the cancer centers. If you would like for your care team to wear a mask while they are taking care of you, please let them know. You may have one support person who is at least 72 years old accompany you for your appointments.

## 2022-05-31 NOTE — Research (Signed)
A Randomized pragmatic Chair-Based Home Exercise Intervention for Mitigating Cancer-Related Fatigue in Older Adults Undergoing Chemotherapy for Advanced Disease  Patient Colin Wagner was identified as a potential candidate for the above listed study.  This Clinical Research Nurse met with Colin Wagner, [id number], on 05/31/22 in a manner and location that ensures patient privacy to discuss participation in the above listed research study.  Patient is Unaccompanied.  Spent 5 minutes explaining this study to patient and he denies moderate to severe fatigue in the past week. He was unable to give a number on scale 1-10 but reported it as "low."  Determined patient is ineligible for this study at this time and patient verbalized understanding. Dr. Julien Nordmann notified.  Foye Spurling, BSN, RN, [facility] Nurse II [phone] 05/31/2022 11:18 AM

## 2022-05-31 NOTE — Progress Notes (Signed)
[facility] [phone]   [phone]  OFFICE PROGRESS NOTE  Colin Evens, MD [place] [address]  DIAGNOSIS: Stage IIIc (T4, N3, M0) non-small cell lung cancer, squamous cell carcinoma presented with large right upper lobe lung mass in addition to mediastinal and bilateral hilar lymphadenopathy diagnosed in September 2023.  PRIOR THERAPY: None  CURRENT THERAPY: A course of concurrent chemoradiation with weekly carboplatin for AUC of 2 and paclitaxel 45 Mg/M2.  First dose May 31, 2022.  INTERVAL HISTORY: Colin Wagner 71 y.o. male returns to the clinic today for follow-up visit accompanied by his wife.  The patient is feeling fine today with no concerning complaints except for anxiety about starting his treatment today.  He denied having any current chest pain but has mild shortness of breath with exertion with mild cough and no hemoptysis.  He has no nausea, vomiting, diarrhea or constipation.  He has no headache or visual changes.  He denied having any significant weight loss or night sweats.  He is here today for evaluation before starting the first dose of his treatment with concurrent chemoradiation.  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    Right shoulder pain    Testicular hypofunction     ALLERGIES:  has No Known Allergies.  MEDICATIONS:  Current Outpatient Medications  Medication Sig Dispense Refill   acetaminophen (TYLENOL) 500 MG tablet Take 1,000 mg by mouth every 6 (six) hours as needed for mild pain or moderate pain.     albuterol (VENTOLIN HFA) 108 (90 Base) MCG/ACT inhaler Inhale 2 puffs into the lungs every 4 (four) hours as needed for wheezing or shortness of breath.     amLODipine (NORVASC) 5 MG tablet Take 5 mg by mouth daily.     aspirin EC 81 MG tablet Take 81 mg by mouth daily.     atorvastatin (LIPITOR) 40 MG tablet Take 40 mg by mouth daily.     cholecalciferol (VITAMIN D) 25 MCG (1000 UNIT) tablet Take 1,000 Units by mouth daily.     clopidogrel (PLAVIX) 75 MG tablet Take 75 mg by mouth daily.     cyclobenzaprine (FLEXERIL) 10 MG tablet Take 10 mg by mouth 4 (four) times daily as needed for muscle spasms.     ferrous sulfate 325 (65 FE) MG tablet Take 325 mg by mouth daily with breakfast.     fluticasone (FLONASE) 50 MCG/ACT nasal spray Place 2 sprays into both nostrils daily.  GARLIC PO Take 1 tablet by mouth daily.     glipiZIDE (GLUCOTROL XL) 2.5 MG 24 hr tablet Take 2.5 mg by mouth every morning.     hydrochlorothiazide (HYDRODIURIL) 12.5 MG tablet Take 12.5 mg by mouth daily.     HYDROcodone-acetaminophen (NORCO) 7.5-325 MG tablet Take 1 tablet by mouth every 6 (six) hours as needed (pain). 60 tablet 0   LEVEMIR FLEXTOUCH 100 UNIT/ML Pen Inject 40 Units into the skin at bedtime. X 6 more nights, then return back to 22 units at bedtime (Patient taking differently: Inject 32 Units into the skin every evening.) 15 mL 11   lidocaine-prilocaine (EMLA) cream Apply to the Port-A-Cath site 30-60 minutes before treatment. 30 g 0   lisinopril (ZESTRIL) 5 MG tablet Take 5 mg by mouth daily.     LORazepam (ATIVAN) 1 MG tablet Take 1  mg by mouth every 6 (six) hours as needed for anxiety or sleep.  5   pantoprazole (PROTONIX) 40 MG tablet Take 1 tablet (40 mg total) by mouth 2 (two) times daily before a meal. 60 tablet 3   prochlorperazine (COMPAZINE) 10 MG tablet Take 1 tablet (10 mg total) by mouth every 6 (six) hours as needed for nausea or vomiting. 30 tablet 0   senna (SENOKOT) 8.6 MG TABS tablet Take 1 tablet (8.6 mg total) by mouth at bedtime as needed for mild constipation. May be needed as pain medication can constipate. 120 each 1   Sennosides 25 MG TABS Take 25 mg by mouth daily as needed (severe constipation).     sildenafil (REVATIO) 20 MG tablet Take 60 mg by mouth daily as needed (erectile dysfunction).     sildenafil (VIAGRA) 100 MG tablet Take 100 mg by mouth daily as needed for erectile dysfunction.     tadalafil (CIALIS) 20 MG tablet Take 1 tablet (20 mg total) by mouth daily as needed for erectile dysfunction. Take 30-60 minutes prior to intercourse 20 tablet 11   Testosterone Cypionate 200 MG/ML SOLN Inject 300 mg as directed every 14 (fourteen) days.     traZODone (DESYREL) 50 MG tablet Take 50 mg by mouth at bedtime.     umeclidinium-vilanterol (ANORO ELLIPTA) 62.5-25 MCG/ACT AEPB Inhale 1 puff into the lungs daily. 1 each 5   No current facility-administered medications for this visit.   Facility-Administered Medications Ordered in Other Visits  Medication Dose Route Frequency Provider Last Rate Last Admin   sodium chloride flush (NS) 0.9 % injection 10 mL  10 mL Intravenous PRN Curt Bears, MD   10 mL at 05/31/22 0756    SURGICAL HISTORY:  Past Surgical History:  Procedure Laterality Date   BALLOON DILATION  12/31/2021   Procedure: BALLOON DILATION;  Surgeon: [name], MD;  Location: [facility];  Service: Endoscopy;;   BIOPSY  04/30/2022   Procedure: BIOPSY;  Surgeon: Maryjane Hurter, MD;  Location: [facility];  Service: Pulmonary;;   CAROTID STENT  06/2018   COLONOSCOPY  2003    Dr. [name], polyps   COLONOSCOPY  2005   Dr. [name], multiple diverticula   COLONOSCOPY  2008   Dr. [name], diverticulosis   COLONOSCOPY WITH PROPOFOL N/A 10/21/2016   Dr. [name]: Diverticulosis, two 5-7 mm polyps removed. path-tubular adenomas.  Next colonoscopy in 5 years.   COLONOSCOPY WITH PROPOFOL N/A 12/31/2021   Procedure: COLONOSCOPY WITH PROPOFOL;  Surgeon: [name], MD;  [facility];  Service: Endoscopy;  Laterality: N/A;  8:15am  ENDOBRONCHIAL ULTRASOUND Right 04/30/2022   Procedure: ENDOBRONCHIAL ULTRASOUND;  Surgeon: Maryjane Hurter, MD;  Location: [facility];  Service: Pulmonary;  Laterality: Right;   ESOPHAGOGASTRODUODENOSCOPY     Multiple EGDs. 1999 EGD showed gastric ulcers, no H pylori and benign biopsies performed by Dr. [name]. 2001 gastric ulcer healed. Last EGD 2005 had gastritis.   ESOPHAGOGASTRODUODENOSCOPY (EGD) WITH PROPOFOL N/A 12/31/2021   Procedure: ESOPHAGOGASTRODUODENOSCOPY (EGD) WITH PROPOFOL;  Surgeon: [name], MD;  [facility];  Service: Endoscopy;  Laterality: N/A;   FINE NEEDLE ASPIRATION  04/30/2022   Procedure: FINE NEEDLE ASPIRATION (FNA) LINEAR;  Surgeon: Maryjane Hurter, MD;  Location: [facility];  Service: Pulmonary;;   IR GASTROSTOMY TUBE REMOVAL  02/08/2020   IR IMAGING GUIDED PORT INSERTION  05/26/2022   IR REPLACE G-TUBE SIMPLE WO FLUORO  03/20/2019   IR REPLACE G-TUBE SIMPLE WO FLUORO  09/21/2019   IR REPLC GASTRO/COLONIC TUBE PERCUT W/FLUORO  05/16/2018   Left partial mandibulectomy, Scapular free flap reconstruction, selective neck dissection, tracheotomy, and resection of intraoral palate cancer. Left 03/01/2017   [facility]   LUNG BIOPSY     MOUTH SURGERY     PARTIAL NEPHRECTOMY Left 2012   POLYPECTOMY  10/21/2016   Procedure: POLYPECTOMY;  Surgeon: [name], MD;  [facility];  Service: Endoscopy;;  hepatic flexure x2   POLYPECTOMY  12/31/2021    Procedure: POLYPECTOMY INTESTINAL;  Surgeon: [name], MD;  [facility];  Service: Endoscopy;;   SURGERY OF LIP  06/2019   lip revision   TRANSURETHRAL RESECTION OF PROSTATE N/A 03/01/2019   Procedure: TRANSURETHRAL RESECTION OF THE PROSTATE (TURP)WITH CYSTOSCOPY;  Surgeon: Raynelle Bring, MD;  Location: [facility];  Service: Urology;  Laterality: N/A;   VIDEO BRONCHOSCOPY Right 04/30/2022   Procedure: VIDEO BRONCHOSCOPY WITHOUT FLUORO;  Surgeon: Maryjane Hurter, MD;  Location: [facility];  Service: Pulmonary;  Laterality: Right;    REVIEW OF SYSTEMS:  A comprehensive review of systems was negative except for: Constitutional: positive for fatigue Respiratory: positive for cough and dyspnea on exertion Behavioral/Psych: positive for anxiety   PHYSICAL EXAMINATION: General appearance: alert, cooperative, fatigued, and no distress Head: Normocephalic, without obvious abnormality, atraumatic Neck: no adenopathy, no JVD, supple, symmetrical, trachea midline, and thyroid not enlarged, symmetric, no tenderness/mass/nodules Lymph nodes: Cervical, supraclavicular, and axillary nodes normal. Resp: clear to auscultation bilaterally Back: symmetric, no curvature. ROM normal. No CVA tenderness. Cardio: regular rate and rhythm, S1, S2 normal, no murmur, click, rub or gallop GI: soft, non-tender; bowel sounds normal; no masses,  no organomegaly Extremities: extremities normal, atraumatic, no cyanosis or edema  ECOG PERFORMANCE STATUS: 1 - Symptomatic but completely ambulatory  Blood pressure (!) 143/93, pulse (!) 102, temperature 97.9 F (36.6 C), temperature source Oral, resp. rate 16, weight 191 lb (86.6 kg), SpO2 90 %.  LABORATORY DATA: Lab Results  Component Value Date   WBC 8.4 05/20/2022   HGB 12.9 (L) 05/20/2022   HCT 39.5 05/20/2022   MCV 79.5 (L) 05/20/2022   PLT 324 05/20/2022      Chemistry      Component Value Date/Time   NA 135 05/20/2022 1052   K 5.5 (H)  05/20/2022 1052   CL 98 05/20/2022 1052   CO2 30 05/20/2022 1052   BUN 40 (H) 05/20/2022 1052   CREATININE 1.65 (H) 05/20/2022 1052      Component Value Date/Time   CALCIUM 9.6 05/20/2022 1052  ALKPHOS 67 05/20/2022 1052   AST 9 (L) 05/20/2022 1052   ALT 7 05/20/2022 1052   BILITOT 0.4 05/20/2022 1052       RADIOGRAPHIC STUDIES: IR IMAGING GUIDED PORT INSERTION  Result Date: 05/26/2022 INDICATION: Right upper lobe lung cancer undergoing combined chemo radiation therapy. He presents for placement of a port catheter to establish durable venous access. Given the location of the confluent mass in the right upper lung, a left-sided port catheter approach will be taken. EXAM: IMPLANTED PORT A CATH PLACEMENT WITH ULTRASOUND AND FLUOROSCOPIC GUIDANCE MEDICATIONS: None. ANESTHESIA/SEDATION: Versed 2 mg IV; Fentanyl 100 mcg IV; Moderate Sedation Time:  21 minutes The patient's vital signs and level of consciousness were continuously monitored during the procedure by the interventional radiology nurse under my direct supervision. FLUOROSCOPY: Radiation exposure index: 3 mGy reference air kerma COMPLICATIONS: None immediate. PROCEDURE: The right neck and chest was prepped with chlorhexidine, and draped in the usual sterile fashion using maximum barrier technique (cap and mask, sterile gown, sterile gloves, large sterile sheet, hand hygiene and cutaneous antiseptic). Local anesthesia was attained by infiltration with 1% lidocaine with epinephrine. Ultrasound demonstrated patency of the left subclavian vein, and this was documented with an image. Under real-time ultrasound guidance, this vein was accessed with a 21 gauge micropuncture needle and image documentation was performed. A small dermatotomy was made at the access site with an 11 scalpel. A 0.018" wire was advanced into the SVC and the access needle exchanged for a 65F micropuncture vascular sheath. The 0.018" wire was then removed and a 0.035" wire  advanced into the IVC. An appropriate location for the subcutaneous reservoir was selected below the clavicle and an incision was made through the skin and underlying soft tissues. The subcutaneous tissues were then dissected using a combination of blunt and sharp surgical technique and a pocket was formed. A single lumen power injectable portacatheter was then tunneled through the subcutaneous tissues from the pocket to the dermatotomy and the port reservoir placed within the subcutaneous pocket. The venous access site was then serially dilated and a peel away vascular sheath placed over the wire. The wire was removed and the port catheter advanced into position under fluoroscopic guidance. The catheter tip is positioned in the superior cavoatrial junction. This was documented with a spot image. The portacatheter was then tested and found to flush and aspirate well. The port was flushed with saline followed by 100 units/mL heparinized saline. The pocket was then closed in two layers using first subdermal inverted interrupted absorbable sutures followed by a running subcuticular suture. The epidermis was then sealed with Dermabond. The dermatotomy at the venous access site was also closed with Dermabond. IMPRESSION: Successful placement of a left approach Power Port with ultrasound and fluoroscopic guidance. The catheter is ready for use. Electronically Signed   By: Jacqulynn Cadet M.D.   On: 05/26/2022 14:48   MR BRAIN W WO CONTRAST  Result Date: 05/17/2022 CLINICAL DATA:  Lung cancer staging EXAM: MRI HEAD WITHOUT AND WITH CONTRAST TECHNIQUE: Multiplanar, multiecho pulse sequences of the brain and surrounding structures were obtained without and with intravenous contrast. CONTRAST:  38mL GADAVIST GADOBUTROL 1 MMOL/ML IV SOLN COMPARISON:  None Available. FINDINGS: Image quality is degraded by motion artifact. Brain: There is no acute intracranial hemorrhage, extra-axial fluid collection, or acute infarct.  Parenchymal volume is normal for age. The ventricles are normal in size. Gray-white differentiation is preserved. There is no significant burden of underlying white matter microangiopathic change. 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 There is no surrounding edema or mass effect. There is no other abnormal enhancement. There is no mass effect or midline shift. Vascular: Normal flow voids. Skull and upper cervical spine: Normal marrow signal. Sinuses/Orbits: The paranasal sinuses are clear. There is remote fracture of the right lamina papyracea. The globes and orbits are otherwise unremarkable. Other: There is a left mastoid effusion, unchanged since the CT neck from 2019. The imaged left nasopharynx is unremarkable. Postsurgical changes are noted in the left mandible and adjacent soft tissues IMPRESSION: 1. Image quality is degraded by motion artifact. 2. 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 Consider short interval follow up in 2-3 months with post contrast black blood sequences to ensure stability. 3. No other abnormal enhancement to suggest intracranial metastatic disease. Electronically Signed   By: Valetta Mole M.D.   On: 05/17/2022 08:08    ASSESSMENT AND PLAN: This is a very pleasant 71 years old African-American male recently diagnosed with stage IIIc (T4, N3, M0) non-small cell lung cancer, squamous cell carcinoma presented with large right upper lobe lung mass in addition to mediastinal and bilateral hilar lymphadenopathy in September 2023. The patient is feeling fine today with no concerning complaints except for mild fatigue and anxiety about starting his treatment. The patient is currently undergoing a course of concurrent chemoradiation with weekly carboplatin for AUC of 2 and  paclitaxel 45 Mg/M2.  First dose May 31, 2022. I reminded the patient of the adverse effect of his treatment and he would like to proceed with the treatment as planned. I will see him back for follow-up visit in 2 weeks for evaluation and management of any adverse effect before starting cycle #3. The patient was advised to call immediately if he has any other concerning symptoms in the interval. The patient voices understanding of current disease status and treatment options and is in agreement with the current care plan.  All questions were answered. The patient knows to call the clinic with any problems, questions or concerns. We can certainly see the patient much sooner if necessary.  The total time spent in the appointment was 20 minutes.  Disclaimer: This note was dictated with voice recognition software. Similar sounding words can inadvertently be transcribed and may not be corrected upon review.

## 2022-05-31 NOTE — Progress Notes (Signed)
Pt here for patient teaching.  Pt given Radiation and You booklet, skin care instructions, and Sonafine.  Reviewed areas of pertinence such as fatigue, hair loss, nausea and vomiting, skin changes, throat changes, cough, and shortness of breath . Pt able to give teach back of drink plenty of water,apply Sonafine bid and avoid applying anything to skin within 4 hours of treatment. Pt verbalizes understanding of information given and will contact nursing with any questions or concerns.     [email or web address]

## 2022-05-31 NOTE — Progress Notes (Signed)
Per Dr. Julien Nordmann ok to proceed with tx with Scr 1.81.

## 2022-06-01 ENCOUNTER — Telehealth: Payer: Self-pay | Admitting: *Deleted

## 2022-06-01 ENCOUNTER — Ambulatory Visit
Admission: RE | Admit: 2022-06-01 | Discharge: 2022-06-01 | Disposition: A | Discharge status: Home / Self Care | Payer: Medicare Other | Source: Ambulatory Visit | Attending: Radiation Oncology | Admitting: Radiation Oncology

## 2022-06-01 ENCOUNTER — Other Ambulatory Visit: Payer: Self-pay

## 2022-06-01 DIAGNOSIS — Z5111 Encounter for antineoplastic chemotherapy: Secondary | ICD-10-CM | POA: Diagnosis not present

## 2022-06-01 LAB — RAD ONC ARIA SESSION SUMMARY
Course Elapsed Days: 6
Plan Fractions Treated to Date: 5
Plan Prescribed Dose Per Fraction: 2 Gy
Plan Total Fractions Prescribed: 30
Plan Total Prescribed Dose: 60 Gy
Reference Point Dosage Given to Date: 10 Gy
Reference Point Session Dosage Given: 2 Gy
Session Number: 5

## 2022-06-01 NOTE — Telephone Encounter (Signed)
-----   Message from Juanetta Gosling, RN sent at 05/31/2022  1:43 PM EDT ----- Regarding: First time Taxol/Carbo Dr. Julien Nordmann Patient received first time taxol/carbo without incident. Patient tolerated tx well. Dr. Julien Nordmann patient. Due for first time call back.

## 2022-06-01 NOTE — Telephone Encounter (Signed)
Called pt to see how he did with his recent treatment.  He reports doing OK & denies any symptoms.  He reports knowing how to reach [place] if needed & also knows his next appts.

## 2022-06-02 ENCOUNTER — Other Ambulatory Visit: Payer: Self-pay

## 2022-06-02 ENCOUNTER — Ambulatory Visit
Admission: RE | Admit: 2022-06-02 | Discharge: 2022-06-02 | Disposition: A | Discharge status: Home / Self Care | Payer: Medicare Other | Source: Ambulatory Visit | Attending: Radiation Oncology | Admitting: Radiation Oncology

## 2022-06-02 DIAGNOSIS — Z5111 Encounter for antineoplastic chemotherapy: Secondary | ICD-10-CM | POA: Diagnosis not present

## 2022-06-02 LAB — RAD ONC ARIA SESSION SUMMARY
Course Elapsed Days: 7
Plan Fractions Treated to Date: 6
Plan Prescribed Dose Per Fraction: 2 Gy
Plan Total Fractions Prescribed: 30
Plan Total Prescribed Dose: 60 Gy
Reference Point Dosage Given to Date: 12 Gy
Reference Point Session Dosage Given: 2 Gy
Session Number: 6

## 2022-06-03 ENCOUNTER — Other Ambulatory Visit: Payer: Self-pay

## 2022-06-03 ENCOUNTER — Inpatient Hospital Stay: Payer: Medicare Other | Admitting: Licensed Clinical Social Worker

## 2022-06-03 ENCOUNTER — Ambulatory Visit
Admission: RE | Admit: 2022-06-03 | Discharge: 2022-06-03 | Disposition: A | Discharge status: Home / Self Care | Payer: Medicare Other | Source: Ambulatory Visit | Attending: Radiation Oncology | Admitting: Radiation Oncology

## 2022-06-03 DIAGNOSIS — Z5111 Encounter for antineoplastic chemotherapy: Secondary | ICD-10-CM | POA: Diagnosis not present

## 2022-06-03 DIAGNOSIS — C3411 Malignant neoplasm of upper lobe, right bronchus or lung: Secondary | ICD-10-CM

## 2022-06-03 LAB — RAD ONC ARIA SESSION SUMMARY
Course Elapsed Days: 8
Plan Fractions Treated to Date: 7
Plan Prescribed Dose Per Fraction: 2 Gy
Plan Total Fractions Prescribed: 30
Plan Total Prescribed Dose: 60 Gy
Reference Point Dosage Given to Date: 14 Gy
Reference Point Session Dosage Given: 2 Gy
Session Number: 7

## 2022-06-03 NOTE — Progress Notes (Signed)
[place] Work  Initial Assessment   Colin Wagner is a 71 y.o. year old male contacted significant other by phone Colin Wagner) as pt has had previous jaw surgery to remove a mass, making it difficult for him to be understood over the phone.  Clinical Social Work was referred by medical provider for assessment of psychosocial needs.   SDOH (Social Determinants of Health) assessments performed: Yes SDOH Interventions    Flowsheet Row Clinical Support from 06/03/2022 in [facility]  SDOH Interventions   Financial Strain Interventions Financial Counselor       SDOH Screenings   Food Insecurity: No Food Insecurity (07/27/2019)  Transportation Needs: No Transportation Needs (09/08/2018)  Financial Resource Strain: High Risk (06/03/2022)  Physical Activity: Inactive (07/27/2019)  Social Connections: Unknown (07/27/2019)  Stress: No Stress Concern Present (07/27/2019)  Tobacco Use: Medium Risk (05/31/2022)     Distress Screen completed: No    05/17/2022    7:39 AM  ON[facility] DISTRESS SCREENING  Screening Type Initial Screening  Distress experienced in past week (1-10) 0      Family/Social Information:  Housing Arrangement: patient lives with significant other.   Family members/support persons in your life? Pt's significant other is his primary support.  Colin Wagner has a friend who has recently started stopping by to assist w/ cleaning, but their support is limited.   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  Income source: Theatre stage manager Income Financial concerns: Yes, current concerns Type of concern: Utilities and Rent/ mortgage Food access concerns: no Religious or spiritual practice: No Services Currently in place:  none  Coping/ Adjustment to diagnosis: Patient understands treatment plan and what happens next? yes Concerns about  diagnosis and/or treatment: How I will pay for the services I need Patient reported stressors: Finances Hopes and/or priorities: Pt's priority is to start treatment w/ the hope of positive results Patient enjoys  not addressed Current coping skills/ strengths: Motivation for treatment/growth     SUMMARY: Current SDOH Barriers:  Financial constraints related to lack of income.  Pt's significant other is applying for disability and at present does not have an income.  Clinical Social Work Clinical Goal(s):  Explore community resource options for unmet needs related to:  Financial Strain   Interventions: Discussed common feeling and emotions when being diagnosed with cancer, and the importance of support during treatment Informed patient of the support team roles and support services at [facility] Provided [place] contact information and encouraged patient to call with any questions or concerns Referred patient to financial counselor and will provide pt w/ a [employer] card at his next treatment   Follow Up Plan: CSW will see patient on 10/13 to provide w/ a [employer] card Patient verbalizes understanding of plan: Yes    Henriette Combs, LCSW

## 2022-06-04 ENCOUNTER — Other Ambulatory Visit: Payer: Self-pay

## 2022-06-04 ENCOUNTER — Ambulatory Visit
Admission: RE | Admit: 2022-06-04 | Discharge: 2022-06-04 | Disposition: A | Discharge status: Home / Self Care | Payer: Medicare Other | Source: Ambulatory Visit | Attending: Radiation Oncology | Admitting: Radiation Oncology

## 2022-06-04 ENCOUNTER — Inpatient Hospital Stay: Payer: Medicare Other | Admitting: Licensed Clinical Social Worker

## 2022-06-04 DIAGNOSIS — Z5111 Encounter for antineoplastic chemotherapy: Secondary | ICD-10-CM | POA: Diagnosis not present

## 2022-06-04 DIAGNOSIS — C3411 Malignant neoplasm of upper lobe, right bronchus or lung: Secondary | ICD-10-CM

## 2022-06-04 LAB — RAD ONC ARIA SESSION SUMMARY
Course Elapsed Days: 9
Plan Fractions Treated to Date: 8
Plan Prescribed Dose Per Fraction: 2 Gy
Plan Total Fractions Prescribed: 30
Plan Total Prescribed Dose: 60 Gy
Reference Point Dosage Given to Date: 16 Gy
Reference Point Session Dosage Given: 2 Gy
Session Number: 8

## 2022-06-04 MED FILL — Dexamethasone Sodium Phosphate Inj 100 MG/10ML: INTRAMUSCULAR | Qty: 1 | Status: AC

## 2022-06-04 NOTE — Progress Notes (Signed)
[facility] CSW Progress Note  Holiday representative met with patient and significant other in supportive services to provide first [employer] card.  CSW to continue to provide support as appropriate throughout duration of treatment.      Henriette Combs, LCSW    Patient is participating in a Managed Medicaid Plan:  Yes

## 2022-06-07 ENCOUNTER — Inpatient Hospital Stay: Payer: Medicare Other

## 2022-06-07 ENCOUNTER — Ambulatory Visit
Admission: RE | Admit: 2022-06-07 | Discharge: 2022-06-07 | Disposition: A | Discharge status: Home / Self Care | Payer: Medicare Other | Source: Ambulatory Visit | Attending: Radiation Oncology | Admitting: Radiation Oncology

## 2022-06-07 ENCOUNTER — Other Ambulatory Visit: Payer: Self-pay

## 2022-06-07 ENCOUNTER — Ambulatory Visit: Payer: Medicare Other

## 2022-06-07 ENCOUNTER — Inpatient Hospital Stay: Payer: Medicare Other | Admitting: Licensed Clinical Social Worker

## 2022-06-07 VITALS — BP 143/63 | HR 96 | Temp 97.8°F | Resp 16 | Ht 71.0 in | Wt 186.8 lb

## 2022-06-07 DIAGNOSIS — Z95828 Presence of other vascular implants and grafts: Secondary | ICD-10-CM

## 2022-06-07 DIAGNOSIS — C3411 Malignant neoplasm of upper lobe, right bronchus or lung: Secondary | ICD-10-CM

## 2022-06-07 DIAGNOSIS — Z5111 Encounter for antineoplastic chemotherapy: Secondary | ICD-10-CM | POA: Diagnosis not present

## 2022-06-07 LAB — CMP (CANCER CENTER ONLY)
ALT: 10 U/L (ref 0–44)
AST: 10 U/L — ABNORMAL LOW (ref 15–41)
Albumin: 3.6 g/dL (ref 3.5–5.0)
Alkaline Phosphatase: 71 U/L (ref 38–126)
Anion gap: 4 — ABNORMAL LOW (ref 5–15)
BUN: 27 mg/dL — ABNORMAL HIGH (ref 8–23)
CO2: 30 mmol/L (ref 22–32)
Calcium: 9.6 mg/dL (ref 8.9–10.3)
Chloride: 95 mmol/L — ABNORMAL LOW (ref 98–111)
Creatinine: 1.68 mg/dL — ABNORMAL HIGH (ref 0.61–1.24)
GFR, Estimated: 43 mL/min — ABNORMAL LOW (ref >60)
Glucose, Bld: 300 mg/dL — ABNORMAL HIGH (ref 70–99)
Potassium: 5 mmol/L (ref 3.5–5.1)
Sodium: 129 mmol/L — ABNORMAL LOW (ref 135–145)
Total Bilirubin: 0.4 mg/dL (ref 0.3–1.2)
Total Protein: 7.4 g/dL (ref 6.5–8.1)

## 2022-06-07 LAB — CBC WITH DIFFERENTIAL (CANCER CENTER ONLY)
Abs Immature Granulocytes: 0.06 10*3/uL (ref 0.00–0.07)
Basophils Absolute: 0 10*3/uL (ref 0.0–0.1)
Basophils Relative: 0 %
Eosinophils Absolute: 0.1 10*3/uL (ref 0.0–0.5)
Eosinophils Relative: 2 %
HCT: 36 % — ABNORMAL LOW (ref 39.0–52.0)
Hemoglobin: 11.7 g/dL — ABNORMAL LOW (ref 13.0–17.0)
Immature Granulocytes: 1 %
Lymphocytes Relative: 3 %
Lymphs Abs: 0.2 10*3/uL — ABNORMAL LOW (ref 0.7–4.0)
MCH: 25.4 pg — ABNORMAL LOW (ref 26.0–34.0)
MCHC: 32.5 g/dL (ref 30.0–36.0)
MCV: 78.1 fL — ABNORMAL LOW (ref 80.0–100.0)
Monocytes Absolute: 0.3 10*3/uL (ref 0.1–1.0)
Monocytes Relative: 4 %
Neutro Abs: 6.4 10*3/uL (ref 1.7–7.7)
Neutrophils Relative %: 90 %
Platelet Count: 336 10*3/uL (ref 150–400)
RBC: 4.61 MIL/uL (ref 4.22–5.81)
RDW: 15.9 % — ABNORMAL HIGH (ref 11.5–15.5)
WBC Count: 7.1 10*3/uL (ref 4.0–10.5)
nRBC: 0 % (ref 0.0–0.2)

## 2022-06-07 LAB — RAD ONC ARIA SESSION SUMMARY
Course Elapsed Days: 12
Plan Fractions Treated to Date: 9
Plan Prescribed Dose Per Fraction: 2 Gy
Plan Total Fractions Prescribed: 30
Plan Total Prescribed Dose: 60 Gy
Reference Point Dosage Given to Date: 18 Gy
Reference Point Session Dosage Given: 2 Gy
Session Number: 9

## 2022-06-07 MED ORDER — SODIUM CHLORIDE 0.9% FLUSH
10.0000 mL | INTRAVENOUS | Status: DC | PRN
  Administered 2022-06-07: 10 mL

## 2022-06-07 MED ORDER — SODIUM CHLORIDE 0.9% FLUSH
10.0000 mL | INTRAVENOUS | Status: AC | PRN
  Administered 2022-06-07: 10 mL

## 2022-06-07 MED ORDER — CETIRIZINE HCL 10 MG/ML IV SOLN
10.0000 mg | Freq: Once | INTRAVENOUS | Status: AC
  Administered 2022-06-07: 10 mg via INTRAVENOUS
  Filled 2022-06-07: qty 1

## 2022-06-07 MED ORDER — FAMOTIDINE IN NACL 20-0.9 MG/50ML-% IV SOLN
20.0000 mg | Freq: Once | INTRAVENOUS | Status: AC
  Administered 2022-06-07: 20 mg via INTRAVENOUS
  Filled 2022-06-07: qty 50

## 2022-06-07 MED ORDER — PALONOSETRON HCL INJECTION 0.25 MG/5ML
0.2500 mg | Freq: Once | INTRAVENOUS | Status: AC
  Administered 2022-06-07: 0.25 mg via INTRAVENOUS
  Filled 2022-06-07: qty 5

## 2022-06-07 MED ORDER — SODIUM CHLORIDE 0.9 % IV SOLN: Freq: Once | INTRAVENOUS | Status: AC

## 2022-06-07 MED ORDER — SODIUM CHLORIDE 0.9 % IV SOLN
10.0000 mg | Freq: Once | INTRAVENOUS | Status: AC
  Administered 2022-06-07: 10 mg via INTRAVENOUS
  Filled 2022-06-07: qty 10

## 2022-06-07 MED ORDER — SODIUM CHLORIDE 0.9 % IV SOLN
45.0000 mg/m2 | Freq: Once | INTRAVENOUS | Status: AC
  Administered 2022-06-07: 90 mg via INTRAVENOUS
  Filled 2022-06-07: qty 15

## 2022-06-07 MED ORDER — SODIUM CHLORIDE 0.9 % IV SOLN
146.8000 mg | Freq: Once | INTRAVENOUS | Status: AC
  Administered 2022-06-07: 150 mg via INTRAVENOUS
  Filled 2022-06-07: qty 15

## 2022-06-07 MED ORDER — HEPARIN SOD (PORK) LOCK FLUSH 100 UNIT/ML IV SOLN
500.0000 [IU] | Freq: Once | INTRAVENOUS | Status: AC | PRN
  Administered 2022-06-07: 500 [IU]

## 2022-06-07 NOTE — Progress Notes (Signed)
Per Dr Julien Nordmann, ok to proceed with creatine 1.68 today

## 2022-06-07 NOTE — Patient Instructions (Signed)
[facility]COL[facility]  Discharge Instructions: Thank you for choosing [facility] to provide your oncology and hematology care.   If you have a lab appointment with the [facility], please go directly to the [facility] and check in at the registration area.   Wear comfortable clothing and clothing appropriate for easy access to any Portacath or PICC line.   We strive to give you quality time with your provider. You may need to reschedule your appointment if you arrive late (15 or more minutes).  Arriving late affects you and other patients whose appointments are after yours.  Also, if you miss three or more appointments without notifying the office, you may be dismissed from the clinic at the provider's discretion.      For prescription refill requests, have your pharmacy contact our office and allow 72 hours for refills to be completed.    Today you received the following chemotherapy and/or immunotherapy agents Taxol/Carbo      To help prevent nausea and vomiting after your treatment, we encourage you to take your nausea medication as directed.  BELOW ARE SYMPTOMS THAT SHOULD BE REPORTED IMMEDIATELY: *FEVER GREATER THAN 100.4 F (38 C) OR HIGHER *CHILLS OR SWEATING *NAUSEA AND VOMITING THAT IS NOT CONTROLLED WITH YOUR NAUSEA MEDICATION *UNUSUAL SHORTNESS OF BREATH *UNUSUAL BRUISING OR BLEEDING *URINARY PROBLEMS (pain or burning when urinating, or frequent urination) *BOWEL PROBLEMS (unusual diarrhea, constipation, pain near the anus) TENDERNESS IN MOUTH AND THROAT WITH OR WITHOUT PRESENCE OF ULCERS (sore throat, sores in mouth, or a toothache) UNUSUAL RASH, SWELLING OR PAIN  UNUSUAL VAGINAL DISCHARGE OR ITCHING   Items with * indicate a potential emergency and should be followed up as soon as possible or go to the Emergency Department if any problems should occur.  Please show the CHEMOTHERAPY ALERT CARD or IMMUNOTHERAPY ALERT CARD at check-in to  the Emergency Department and triage nurse.  Should you have questions after your visit or need to cancel or reschedule your appointment, please contact [facility]  Dept: [phone]  and follow the prompts.  Office hours are 8:00 a.m. to 4:30 p.m. Monday - Friday. Please note that voicemails left after 4:00 p.m. may not be returned until the following business day.  We are closed weekends and major holidays. You have access to a nurse at all times for urgent questions. Please call the main number to the clinic Dept: [phone] and follow the prompts.   For any non-urgent questions, you may also contact your provider using MyChart. We now offer e-Visits for anyone 12 and older to request care online for non-urgent symptoms. For details visit [email or web address].   Also download the MyChart app! Go to the app store, search "MyChart", open the app, select [facility], and log in with your MyChart username and password.  Masks are optional in the cancer centers. If you would like for your care team to wear a mask while they are taking care of you, please let them know. You may have one support person who is at least 72 years old accompany you for your appointments.

## 2022-06-07 NOTE — Progress Notes (Unsigned)
[facility] CSW Progress Note  Clinical Education officer, museum  provided pt's spouse w/ additional [employer] card.  CSW  to remain available to provide support as appropriate throughout duration of treatment.    Henriette Combs, LCSW

## 2022-06-08 ENCOUNTER — Other Ambulatory Visit: Payer: Self-pay

## 2022-06-08 ENCOUNTER — Encounter: Payer: Self-pay | Admitting: Internal Medicine

## 2022-06-08 ENCOUNTER — Ambulatory Visit
Admission: RE | Admit: 2022-06-08 | Discharge: 2022-06-08 | Disposition: A | Discharge status: Home / Self Care | Payer: Medicare Other | Source: Ambulatory Visit | Attending: Radiation Oncology | Admitting: Radiation Oncology

## 2022-06-08 DIAGNOSIS — Z5111 Encounter for antineoplastic chemotherapy: Secondary | ICD-10-CM | POA: Diagnosis not present

## 2022-06-08 LAB — RAD ONC ARIA SESSION SUMMARY
Course Elapsed Days: 13
Plan Fractions Treated to Date: 10
Plan Prescribed Dose Per Fraction: 2 Gy
Plan Total Fractions Prescribed: 30
Plan Total Prescribed Dose: 60 Gy
Reference Point Dosage Given to Date: 20 Gy
Reference Point Session Dosage Given: 2 Gy
Session Number: 10

## 2022-06-09 ENCOUNTER — Other Ambulatory Visit: Payer: Self-pay

## 2022-06-09 ENCOUNTER — Ambulatory Visit
Admission: RE | Admit: 2022-06-09 | Discharge: 2022-06-09 | Disposition: A | Discharge status: Home / Self Care | Payer: Medicare Other | Source: Ambulatory Visit | Attending: Radiation Oncology | Admitting: Radiation Oncology

## 2022-06-09 DIAGNOSIS — Z5111 Encounter for antineoplastic chemotherapy: Secondary | ICD-10-CM | POA: Diagnosis not present

## 2022-06-09 LAB — RAD ONC ARIA SESSION SUMMARY
Course Elapsed Days: 14
Plan Fractions Treated to Date: 11
Plan Prescribed Dose Per Fraction: 2 Gy
Plan Total Fractions Prescribed: 30
Plan Total Prescribed Dose: 60 Gy
Reference Point Dosage Given to Date: 22 Gy
Reference Point Session Dosage Given: 2 Gy
Session Number: 11

## 2022-06-10 ENCOUNTER — Ambulatory Visit
Admission: RE | Admit: 2022-06-10 | Discharge: 2022-06-10 | Disposition: A | Discharge status: Home / Self Care | Payer: Medicare Other | Source: Ambulatory Visit | Attending: Radiation Oncology | Admitting: Radiation Oncology

## 2022-06-10 ENCOUNTER — Other Ambulatory Visit: Payer: Self-pay

## 2022-06-10 DIAGNOSIS — Z5111 Encounter for antineoplastic chemotherapy: Secondary | ICD-10-CM | POA: Diagnosis not present

## 2022-06-10 LAB — RAD ONC ARIA SESSION SUMMARY
Course Elapsed Days: 15
Plan Fractions Treated to Date: 12
Plan Prescribed Dose Per Fraction: 2 Gy
Plan Total Fractions Prescribed: 30
Plan Total Prescribed Dose: 60 Gy
Reference Point Dosage Given to Date: 24 Gy
Reference Point Session Dosage Given: 2 Gy
Session Number: 12

## 2022-06-11 ENCOUNTER — Inpatient Hospital Stay: Payer: Medicare Other | Admitting: Dietician

## 2022-06-11 ENCOUNTER — Ambulatory Visit
Admission: RE | Admit: 2022-06-11 | Discharge: 2022-06-11 | Disposition: A | Discharge status: Home / Self Care | Payer: Medicare Other | Source: Ambulatory Visit | Attending: Radiation Oncology | Admitting: Radiation Oncology

## 2022-06-11 ENCOUNTER — Other Ambulatory Visit: Payer: Self-pay

## 2022-06-11 DIAGNOSIS — Z5111 Encounter for antineoplastic chemotherapy: Secondary | ICD-10-CM | POA: Diagnosis not present

## 2022-06-11 LAB — RAD ONC ARIA SESSION SUMMARY
Course Elapsed Days: 16
Plan Fractions Treated to Date: 13
Plan Prescribed Dose Per Fraction: 2 Gy
Plan Total Fractions Prescribed: 30
Plan Total Prescribed Dose: 60 Gy
Reference Point Dosage Given to Date: 26 Gy
Reference Point Session Dosage Given: 2 Gy
Session Number: 13

## 2022-06-11 MED FILL — Dexamethasone Sodium Phosphate Inj 100 MG/10ML: INTRAMUSCULAR | Qty: 1 | Status: AC

## 2022-06-11 NOTE — Progress Notes (Unsigned)
[place] OFFICE PROGRESS NOTE  [name], MD [place] [address]  DIAGNOSIS: Stage IIIc (T4, N3, M0) non-small cell lung cancer, squamous cell carcinoma presented with large right upper lobe lung mass in addition to mediastinal and bilateral hilar lymphadenopathy diagnosed in September 2023.  PRIOR THERAPY: None  CURRENT THERAPY: CURRENT THERAPY: A course of concurrent chemoradiation with weekly carboplatin for AUC of 2 and paclitaxel 45 Mg/M2.  First dose May 31, 2022. Status post 2 cycles.   INTERVAL HISTORY: Colin Wagner 71 y.o. male returns to the clinic today for a follow-up visit accompanied by his girlfriend.  The patient was recently diagnosed with stage III lung cancer.  He is currently undergoing a course of concurrent chemoradiation.  He has completed 2 weeks of treatment and he has tolerated it fairly well. However, he is starting to get epigastric discomfort. He was seen at an urgent care yesterday and was prescribed Carafate. He had a bowel movement this morning which eased up his symptoms. He had not recently been using a stool softener. Yesterday they used an enema. His last day radiation is tentatively scheduled for 07/06/2022. He is scheduled to see Dr. Isidore Moos today.   His initial blood pressure was noted to be low but this was rechecked in the room and was found to be 121/64.  They have a blood pressure cuff at home to be monitoring this.  Patient lost a few pounds since last being seen and he is scheduled to see a member the nutritionist team after his radiation appointment this Thursday.  The patient reports stable dyspnea on exertion.  His cough has gotten better/"eased" up since starting radiation.  He will use 2 L of supplemental oxygen at night.  He is working on getting a pulse oximeter at home.  The patient denies any fever or night sweats. Denies any chest pain or hemoptysis.  Denies any nausea, vomiting, or diarrhea. He sees  a nephrologist for CKD. He also has diabetes. He takes insulin at night. He does not check his BS as much as he "should" per patient report. He has chronic headaches for which his girlfriend has reportedly been giving him her Imitrex. He had a brain MRI a few weeks ago which was negative for metastatic disease to the brain but he is scheduled for a follow up brain MRI to evaluate possible venous anomaly to ensure stability on 08/04/22. He is here today for evaluation and repeat blood work before undergoing cycle #3.    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    Right shoulder pain    Testicular hypofunction     ALLERGIES:  has No Known Allergies.  MEDICATIONS:  Current Outpatient Medications  Medication Sig Dispense Refill   acetaminophen (TYLENOL) 500 MG tablet Take 1,000 mg by mouth every 6 (six) hours as needed for mild pain or moderate pain.     albuterol (VENTOLIN HFA) 108 (90 Base) MCG/ACT inhaler Inhale 2 puffs into the lungs every 4 (four) hours as needed for wheezing or shortness of breath.     amLODipine (NORVASC) 5 MG tablet Take 5 mg by mouth daily.     aspirin EC 81 MG tablet Take 81 mg by mouth daily.     atorvastatin (LIPITOR)  40 MG tablet Take 40 mg by mouth daily.     cholecalciferol (VITAMIN D) 25 MCG (1000 UNIT) tablet Take 1,000 Units by mouth daily.     clopidogrel (PLAVIX) 75 MG tablet Take 75 mg by mouth daily.     cyclobenzaprine (FLEXERIL) 10 MG tablet Take 10 mg by mouth 4 (four) times daily as needed for muscle spasms.     ferrous sulfate 325 (65 FE) MG tablet Take 325 mg by mouth daily with breakfast.     fluconazole (DIFLUCAN) 150 MG tablet Take 1 tablet (150 mg total) by mouth every other day. 3 tablet 0   fluticasone (FLONASE) 50 MCG/ACT nasal spray Place 2 sprays into both nostrils daily.     GARLIC PO Take 1 tablet by mouth daily.     glipiZIDE (GLUCOTROL XL) 2.5 MG 24 hr tablet Take 2.5 mg by mouth every morning.     hydrochlorothiazide (HYDRODIURIL) 12.5 MG tablet Take 12.5 mg by mouth daily.     HYDROcodone-acetaminophen (NORCO) 7.5-325 MG tablet Take 1 tablet by mouth every 6 (six) hours as needed (pain). 60 tablet 0   LEVEMIR FLEXTOUCH 100 UNIT/ML Pen Inject 40 Units into the skin at bedtime. X 6 more nights, then return back to 22 units at bedtime (Patient taking differently: Inject 32 Units into the skin every evening.) 15 mL 11   lidocaine-prilocaine (EMLA) cream Apply to the Port-A-Cath site 30-60 minutes before treatment. 30 g 0   lisinopril (ZESTRIL) 5 MG tablet Take 5 mg by mouth daily.     LORazepam (ATIVAN) 1 MG tablet Take 1 mg by mouth every 6 (six) hours as needed for anxiety or sleep.  5   nystatin cream (MYCOSTATIN) Apply to affected area 2 times daily 80 g 0   pantoprazole (PROTONIX) 40 MG tablet Take 1 tablet (40 mg total) by mouth 2 (two) times daily before a meal. 60 tablet 3   prochlorperazine (COMPAZINE) 10 MG tablet Take 1 tablet (10 mg total) by mouth every 6 (six) hours as needed for nausea or vomiting. 30 tablet 0   senna (SENOKOT) 8.6 MG TABS tablet Take 1 tablet (8.6 mg total) by mouth at bedtime as needed for mild constipation. May be needed as pain medication can  constipate. 120 each 1   Sennosides 25 MG TABS Take 25 mg by mouth daily as needed (severe constipation).     sildenafil (REVATIO) 20 MG tablet Take 60 mg by mouth daily as needed (erectile dysfunction).     sildenafil (VIAGRA) 100 MG tablet Take 100 mg by mouth daily as needed for erectile dysfunction.     sucralfate (CARAFATE) 1 g tablet Take 1 tablet (1 g total) by mouth 3 (three) times daily as needed.  May dissolve 1 tab and glass of water and drink as needed 90 tablet 0   tadalafil (CIALIS) 20 MG tablet Take 1 tablet (20 mg total) by mouth daily as needed for erectile dysfunction. Take 30-60 minutes prior to intercourse 20 tablet 11   testosterone cypionate (DEPOTESTOSTERONE CYPIONATE) 200 MG/ML injection Inject 1.5 mLs into the muscle every 14 (fourteen) days.     Testosterone Cypionate 200 MG/ML SOLN Inject 300 mg as directed every 14 (fourteen) days.     traZODone (DESYREL) 50 MG tablet Take 50 mg by mouth at bedtime.     umeclidinium-vilanterol (ANORO ELLIPTA) 62.5-25 MCG/ACT AEPB Inhale 1 puff into the lungs daily. 1 each 5   No current facility-administered medications for this visit.    SURGICAL HISTORY:  Past Surgical History:  Procedure Laterality Date   BALLOON DILATION  12/31/2021   Procedure: BALLOON DILATION;  Surgeon: [name], MD;  [facility];  Service: Endoscopy;;   BIOPSY  04/30/2022   Procedure: BIOPSY;  Surgeon: Maryjane Hurter, MD;  Location: [facility];  Service: Pulmonary;;   CAROTID STENT  06/2018   COLONOSCOPY  2003   Dr. [name], polyps   COLONOSCOPY  2005   Dr. [name], multiple diverticula   COLONOSCOPY  2008   Dr. [name], diverticulosis   COLONOSCOPY WITH PROPOFOL N/A 10/21/2016   Dr. [name]: Diverticulosis, two 5-7 mm polyps removed. path-tubular adenomas.  Next colonoscopy in 5 years.   COLONOSCOPY WITH PROPOFOL N/A 12/31/2021   Procedure: COLONOSCOPY WITH PROPOFOL;  Surgeon: [name], MD;  [facility];   Service: Endoscopy;  Laterality: N/A;  8:15am   ENDOBRONCHIAL ULTRASOUND Right 04/30/2022   Procedure: ENDOBRONCHIAL ULTRASOUND;  Surgeon: Maryjane Hurter, MD;  Location: [facility];  Service: Pulmonary;  Laterality: Right;   ESOPHAGOGASTRODUODENOSCOPY     Multiple EGDs. 1999 EGD showed gastric ulcers, no H pylori and benign biopsies performed by Dr. [name]. 2001 gastric ulcer healed. Last EGD 2005 had gastritis.   ESOPHAGOGASTRODUODENOSCOPY (EGD) WITH PROPOFOL N/A 12/31/2021   Procedure: ESOPHAGOGASTRODUODENOSCOPY (EGD) WITH PROPOFOL;  Surgeon: [name], MD;  [facility];  Service: Endoscopy;  Laterality: N/A;   FINE NEEDLE ASPIRATION  04/30/2022   Procedure: FINE NEEDLE ASPIRATION (FNA) LINEAR;  Surgeon: Maryjane Hurter, MD;  [facility];  Service: Pulmonary;;   IR GASTROSTOMY TUBE REMOVAL  02/08/2020   IR IMAGING GUIDED PORT INSERTION  05/26/2022   IR REPLACE G-TUBE SIMPLE WO FLUORO  03/20/2019   IR REPLACE G-TUBE SIMPLE WO FLUORO  09/21/2019   IR REPLC GASTRO/COLONIC TUBE PERCUT W/FLUORO  05/16/2018   Left partial mandibulectomy, Scapular free flap reconstruction, selective neck dissection, tracheotomy, and resection of intraoral palate cancer. Left 03/01/2017   [facility]   LUNG BIOPSY     MOUTH SURGERY     PARTIAL NEPHRECTOMY Left 2012   POLYPECTOMY  10/21/2016   Procedure: POLYPECTOMY;  Surgeon: [name], MD;  [facility];  Service: Endoscopy;;  hepatic flexure x2   POLYPECTOMY  12/31/2021   Procedure: POLYPECTOMY INTESTINAL;  Surgeon: [name], MD;  [facility];  Service: Endoscopy;;   SURGERY OF LIP  06/2019   lip revision   TRANSURETHRAL RESECTION OF PROSTATE N/A 03/01/2019   Procedure: TRANSURETHRAL RESECTION OF THE PROSTATE (TURP)WITH CYSTOSCOPY;  Surgeon: Raynelle Bring, MD;  Location: [facility];  Service: Urology;  Laterality: N/A;   VIDEO BRONCHOSCOPY Right 04/30/2022  Procedure: VIDEO  BRONCHOSCOPY WITHOUT FLUORO;  Surgeon: Maryjane Hurter, MD;  Location[facility];  Service: Pulmonary;  Laterality: Right;    REVIEW OF SYSTEMS:   Review of Systems  Constitutional: Positive for stable fatigue and decreased appetite and weight loss.Negative for chills and fever.  HENT: Negative for mouth sores, nosebleeds, sore throat and trouble swallowing.   Eyes: Negative for eye problems and icterus.  Respiratory: Negative for cough, hemoptysis, shortness of breath and wheezing.   Cardiovascular: Negative for chest pain and leg swelling.  Gastrointestinal: Positive for constipation and epigastric discomfort (improved after BM today).  Negative for diarrhea, nausea and vomiting.  Genitourinary: Negative for bladder incontinence, difficulty urinating, dysuria, frequency and hematuria.   Musculoskeletal: Negative for back pain, gait problem, neck pain and neck stiffness.  Skin: Negative for itching and rash.  Neurological: Negative for dizziness, extremity weakness, gait problem, headaches, light-headedness and seizures.  Hematological: Negative for adenopathy. Does not bruise/bleed easily.  Psychiatric/Behavioral: Negative for confusion, depression and sleep disturbance. The patient is not nervous/anxious.     PHYSICAL EXAMINATION:  Blood pressure 92/69, pulse 92, temperature 98.2 F (36.8 C), temperature source Oral, resp. rate 17, weight 188 lb 3.2 oz (85.4 kg), SpO2 93 %.  ECOG PERFORMANCE STATUS: 1  Physical Exam  Constitutional: Oriented to person, place, and time and well-developed, well-nourished, and in no distress.  HENT:  Head: Positive for stable facial drooping (left) secondary to prior radiation from head/neck cancer. Normocephalic and atraumatic.  Mouth/Throat: Oropharynx is clear and moist. No oropharyngeal exudate.  Eyes: Conjunctivae are normal. Right eye exhibits no discharge. Left eye exhibits no discharge. No scleral icterus.  Neck: Normal range of motion.  Neck supple.  Cardiovascular: Normal rate, regular rhythm, normal heart sounds and intact distal pulses.   Pulmonary/Chest: Effort normal and breath sounds normal. No respiratory distress. No wheezes. No rales.  Abdominal: Soft. Bowel sounds are normal. Exhibits no distension and no mass. There is no tenderness.  Musculoskeletal: Normal range of motion. Exhibits no edema.  Lymphadenopathy:    No cervical adenopathy.  Neurological: Alert and oriented to person, place, and time. Exhibits normal muscle tone. Gait normal. Coordination normal.  Skin: Skin is warm and dry. No rash noted. Not diaphoretic. No erythema. No pallor.  Psychiatric: Mood, memory and judgment normal.  Vitals reviewed.  LABORATORY DATA: Lab Results  Component Value Date   WBC 3.3 (L) 06/14/2022   HGB 10.2 (L) 06/14/2022   HCT 32.2 (L) 06/14/2022   MCV 80.3 06/14/2022   PLT 309 06/14/2022      Chemistry      Component Value Date/Time   NA 132 (L) 06/14/2022 0935   K 4.4 06/14/2022 0935   CL 94 (L) 06/14/2022 0935   CO2 33 (H) 06/14/2022 0935   BUN 26 (H) 06/14/2022 0935   CREATININE 1.61 (H) 06/14/2022 0935      Component Value Date/Time   CALCIUM 8.8 (L) 06/14/2022 0935   ALKPHOS 56 06/14/2022 0935   AST 11 (L) 06/14/2022 0935   ALT 10 06/14/2022 0935   BILITOT 0.3 06/14/2022 0935       RADIOGRAPHIC STUDIES:  IR IMAGING GUIDED PORT INSERTION  Result Date: 05/26/2022 INDICATION: Right upper lobe lung cancer undergoing combined chemo radiation therapy. He presents for placement of a port catheter to establish durable venous access. Given the location of the confluent mass in the right upper lung, a left-sided port catheter approach will be taken. EXAM: IMPLANTED PORT A CATH PLACEMENT WITH  ULTRASOUND AND FLUOROSCOPIC GUIDANCE MEDICATIONS: None. ANESTHESIA/SEDATION: Versed 2 mg IV; Fentanyl 100 mcg IV; Moderate Sedation Time:  21 minutes The patient's vital signs and level of consciousness were continuously  monitored during the procedure by the interventional radiology nurse under my direct supervision. FLUOROSCOPY: Radiation exposure index: 3 mGy reference air kerma COMPLICATIONS: None immediate. PROCEDURE: The right neck and chest was prepped with chlorhexidine, and draped in the usual sterile fashion using maximum barrier technique (cap and mask, sterile gown, sterile gloves, large sterile sheet, hand hygiene and cutaneous antiseptic). Local anesthesia was attained by infiltration with 1% lidocaine with epinephrine. Ultrasound demonstrated patency of the left subclavian vein, and this was documented with an image. Under real-time ultrasound guidance, this vein was accessed with a 21 gauge micropuncture needle and image documentation was performed. A small dermatotomy was made at the access site with an 11 scalpel. A 0.018" wire was advanced into the SVC and the access needle exchanged for a 22F micropuncture vascular sheath. The 0.018" wire was then removed and a 0.035" wire advanced into the IVC. An appropriate location for the subcutaneous reservoir was selected below the clavicle and an incision was made through the skin and underlying soft tissues. The subcutaneous tissues were then dissected using a combination of blunt and sharp surgical technique and a pocket was formed. A single lumen power injectable portacatheter was then tunneled through the subcutaneous tissues from the pocket to the dermatotomy and the port reservoir placed within the subcutaneous pocket. The venous access site was then serially dilated and a peel away vascular sheath placed over the wire. The wire was removed and the port catheter advanced into position under fluoroscopic guidance. The catheter tip is positioned in the superior cavoatrial junction. This was documented with a spot image. The portacatheter was then tested and found to flush and aspirate well. The port was flushed with saline followed by 100 units/mL heparinized saline.  The pocket was then closed in two layers using first subdermal inverted interrupted absorbable sutures followed by a running subcuticular suture. The epidermis was then sealed with Dermabond. The dermatotomy at the venous access site was also closed with Dermabond. IMPRESSION: Successful placement of a left approach Power Port with ultrasound and fluoroscopic guidance. The catheter is ready for use. Electronically Signed   By: Jacqulynn Cadet M.D.   On: 05/26/2022 14:48     ASSESSMENT/PLAN:  This is a very pleasant 71 year old African-American male diagnosed with stage IIIc (T4, N3, M0) non-small cell lung cancer, squamous cell carcinoma.  The patient presented with a large right upper lobe lung mass in addition to mediastinal and bilateral lymphadenopathy.  The patient was diagnosed in September 2023.   The patient is currently undergoing a course of concurrent chemoradiation with weekly carboplatin for an AUC of 2 paclitaxel 45 mg per metered square.  The patient is status post 2 cycles.  The patient's been tolerating it well without any concerning adverse side effects except epigastric discomfort since starting radiation. He is currently prescribed carafate and scheduled to see Dr. Isidore Moos today.  The last day radiation is tentatively scheduled for 07/06/22.  Labs were reviewed. The patient has baseline CKD and sees nephrology. Ok to treat with creatinine 1.61 today. Recommend that he proceed with cycle #3 today scheduled.  We will see him back for follow-up visit in 2 weeks for evaluation and repeat blood work before undergoing cycle #5.  Regarding his decreased appetite, the patient is scheduled to see a member the nutritionist team  later this week on 06/17/2022.  Regarding the patient's epigastric discomfort, this was more pronounced yesterday after having several days of constipation.  He did have a bowel movement this morning which eased up his symptoms.  Some degree of this could be secondary  to radiation effects.  He is currently using Carafate.  I also reviewed constipation education with the patient.  If he is constipated frequently, I recommend that he use a stool softeners to keep his bowels regular.  I also encouraged that he drink plenty of fluids, eat fruits and vegetables, be as active as possible to reduce the risk of constipation.  If despite these interventions if he has not had a bowel movement in 2 or more days more than his normal bowel habits, then I recommended that he use a laxative.  His blood sugar is elevated to 90 today.  We will give him 5 units of insulin in the infusion room today.  Additionally, he was advised to monitor this closely at home.  The patient's girlfriend has been giving him her Imitrex for headaches.  We will relay to the patient's girlfriend not to share her medication with the patient and see his PCP regarding his own prescription if indicated.  Our recommendation would be to use Tylenol if needed for headaches.  Of note, the patient staging brain MRI from last month did not show any metastatic disease to the brain.  It did note some possible venous abnormality for which she is scheduled for a repeat brain MRI to ensure stability.  The patient has chronic facial droop (left) due to his history of radiation from head and neck cancer.  The patient was advised to call immediately if he has any concerning symptoms in the interval. The patient voices understanding of current disease status and treatment options and is in agreement with the current care plan. All questions were answered. The patient knows to call the clinic with any problems, questions or concerns. We can certainly see the patient much sooner if necessary       No orders of the defined types were placed in this encounter.    The total time spent in the appointment was 20-29 minutes.   Marquavis Hannen L Trasean Delima, PA-C 06/14/22

## 2022-06-11 NOTE — Progress Notes (Signed)
Nutrition Assessment  Reason for Assessment: MD referral   ASSESSMENT: 71 year old male with stage IIIc NSCLC. He is receiving concurrent chemoradiation therapy with weekly carboplatin/paclitaxel. Patient is under the care of Dr. Julien Nordmann and Dr. Isidore Moos.   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 Rescheduled appointment for Thursday 10/26 after radiation. Reminder placed in appointment notes.    Next Visit: Thursday October 26 after treatment

## 2022-06-13 ENCOUNTER — Ambulatory Visit
Admission: EM | Admit: 2022-06-13 | Discharge: 2022-06-13 | Disposition: A | Discharge status: Home / Self Care | Payer: Medicare Other | Attending: Family Medicine | Admitting: Family Medicine

## 2022-06-13 ENCOUNTER — Other Ambulatory Visit: Payer: Self-pay

## 2022-06-13 DIAGNOSIS — K219 Gastro-esophageal reflux disease without esophagitis: Secondary | ICD-10-CM

## 2022-06-13 DIAGNOSIS — B372 Candidiasis of skin and nail: Secondary | ICD-10-CM | POA: Diagnosis not present

## 2022-06-13 DIAGNOSIS — R1013 Epigastric pain: Secondary | ICD-10-CM | POA: Diagnosis not present

## 2022-06-13 MED ORDER — SUCRALFATE 1 G PO TABS: 1.0000 g | ORAL_TABLET | Freq: Three times a day (TID) | ORAL | 0 refills | Status: DC | PRN

## 2022-06-13 MED ORDER — NYSTATIN 100000 UNIT/GM EX CREA: TOPICAL_CREAM | CUTANEOUS | 0 refills | Status: DC

## 2022-06-13 MED ORDER — FLUCONAZOLE 150 MG PO TABS: 150.0000 mg | ORAL_TABLET | ORAL | 0 refills | Status: DC

## 2022-06-13 NOTE — ED Triage Notes (Addendum)
Pt reports yeast infection on penis for last several days. Pt reports history of similar and states "goes away and comes back." Seen at [facility] for same in the past.  Pt also reports "I feel like I need to burp but can't" pt states started radiation therapy on 10/4 and reports discomfort ever since.

## 2022-06-13 NOTE — ED Provider Notes (Signed)
[facility]    CSN: [id number] Arrival date & time: 06/13/22  0944      History   Chief Complaint Chief Complaint  Patient presents with   Rash    HPI Colin Wagner is a 71 y.o. male.   Patient presenting today with a recurrence of a yeast infection to the tip of his penis that comes and goes every several months.  He has nystatin cream at home that he has been using but typically will need Diflucan tablets to get it to fully go away when it flares.  Denies any new soaps or products at home, new medications or supplements that could have caused the issue.  He is also having some intermittent bouts of epigastric pain after meals that not resolve until he burps.  Tums and other over-the-counter reflux remedies do seem to help some and resolve his symptoms after several minutes.  Denies chest pain, shortness of breath, nausea, vomiting, diarrhea.  Symptoms seem to start around the time of his radiation therapy onset 05/26/2022 for lung cancer treatment.  He follows up with his oncologist tomorrow and plans to discuss it with them as well.    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    Right shoulder pain    Testicular hypofunction      Patient Active Problem List   Diagnosis Date Noted   Encounter for antineoplastic chemotherapy 05/20/2022   Primary malignant neoplasm of right upper lobe of lung ([place]) 05/17/2022   GERD (gastroesophageal reflux disease)    IDA (iron deficiency anemia)    GI bleed 12/04/2019   Anemia    Rectal bleed 11/05/2019   Acute respiratory failure with hypoxia ([place]) 07/29/2019   Pneumonia due to COVID-19 virus 07/28/2019   AKI (acute kidney injury) ([place]) 07/27/2019   Hyperkalemia 07/27/2019   COVID-19 virus infection 07/27/2019   Urinary retention due to benign prostatic hyperplasia 03/01/2019   Attention to G-tube ([place]) 02/08/2018   UTI (urinary tract infection) 04/20/2017   UTI due to Klebsiella species 04/20/2017   Essential hypertension 04/20/2017   Chronic pancreatitis ([place]) 04/20/2017   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   Malnutrition of moderate degree 69/48/5462   Complicated UTI (urinary tract infection)    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   Rectal bleeding 09/28/2016   Hyponatremia 04/28/2015   Abdominal pain 04/27/2015   Acute pancreatitis 04/27/2015   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 [name], polyps   COLONOSCOPY  2005   Dr. [name], multiple diverticula   COLONOSCOPY  2008   Dr. [name], diverticulosis   COLONOSCOPY WITH PROPOFOL N/A 10/21/2016   Dr.  [name]: Diverticulosis, two 5-7 mm polyps removed. path-tubular adenomas.  Next colonoscopy in 5 years.   COLONOSCOPY WITH PROPOFOL N/A 12/31/2021   Procedure: COLONOSCOPY WITH PROPOFOL;  Surgeon: [name], MD;  [facility];  Service: Endoscopy;  Laterality: N/A;  8:15am   ENDOBRONCHIAL ULTRASOUND Right 04/30/2022   Procedure: ENDOBRONCHIAL ULTRASOUND;  Surgeon: Maryjane Hurter, MD;  [facility];  Service: Pulmonary;  Laterality: Right;   ESOPHAGOGASTRODUODENOSCOPY     Multiple EGDs. 1999 EGD showed gastric ulcers, no H pylori and benign biopsies performed by Dr. [name]. 2001 gastric ulcer healed. Last EGD 2005 had gastritis.   ESOPHAGOGASTRODUODENOSCOPY (EGD) WITH PROPOFOL N/A 12/31/2021   Procedure: ESOPHAGOGASTRODUODENOSCOPY (EGD) WITH PROPOFOL;  Surgeon: [name], MD;  [facility];  Service: Endoscopy;  Laterality: N/A;   FINE NEEDLE ASPIRATION  04/30/2022   Procedure: FINE NEEDLE ASPIRATION (FNA) LINEAR;  Surgeon: Maryjane Hurter, MD;  [facility];  Service: Pulmonary;;   IR GASTROSTOMY TUBE REMOVAL  02/08/2020   IR IMAGING GUIDED PORT INSERTION  05/26/2022   IR REPLACE G-TUBE SIMPLE WO FLUORO  03/20/2019   IR REPLACE G-TUBE SIMPLE WO FLUORO  09/21/2019   IR REPLC GASTRO/COLONIC TUBE PERCUT W/FLUORO  05/16/2018   Left partial mandibulectomy, Scapular free flap reconstruction, selective neck dissection, tracheotomy, and resection of intraoral palate cancer. Left 03/01/2017   [facility]   LUNG BIOPSY     MOUTH SURGERY     PARTIAL NEPHRECTOMY Left 2012   POLYPECTOMY  10/21/2016   Procedure: POLYPECTOMY;  Surgeon: [name], MD;  [facility];  Service: Endoscopy;;  hepatic flexure x2   POLYPECTOMY  12/31/2021   Procedure: POLYPECTOMY INTESTINAL;  Surgeon: [name], MD;  [facility];  Service: Endoscopy;;   SURGERY OF LIP  06/2019   lip revision   TRANSURETHRAL RESECTION OF  PROSTATE N/A 03/01/2019   Procedure: TRANSURETHRAL RESECTION OF THE PROSTATE (TURP)WITH CYSTOSCOPY;  Surgeon: Raynelle Bring, MD;  [facility];  Service: Urology;  Laterality: N/A;   VIDEO BRONCHOSCOPY Right 04/30/2022   Procedure: VIDEO BRONCHOSCOPY WITHOUT FLUORO;  Surgeon: Maryjane Hurter, MD;  [facility];  Service: Pulmonary;  Laterality: Right;       Home Medications    Prior to Admission medications   Medication Sig Start Date End Date Taking? Authorizing Provider  fluconazole (DIFLUCAN) 150 MG tablet Take 1 tablet (150 mg total) by mouth every other day. 06/13/22  Yes Volney American, PA-C  nystatin cream (MYCOSTATIN) Apply to affected area 2 times daily 06/13/22  Yes Volney American, PA-C  sucralfate (CARAFATE) 1 g tablet Take 1 tablet (1 g total) by mouth 3 (three) times daily as needed. May dissolve  1 tab and glass of water and drink as needed 06/13/22  Yes Volney American, PA-C  acetaminophen (TYLENOL) 500 MG tablet Take 1,000 mg by mouth every 6 (six) hours as needed for mild pain or moderate pain.    [provider]  albuterol (VENTOLIN HFA) 108 (90 Base) MCG/ACT inhaler Inhale 2 puffs into the lungs every 4 (four) hours as needed for wheezing or shortness of breath. 03/29/22   [provider]  amLODipine (NORVASC) 5 MG tablet Take 5 mg by mouth daily. 12/02/20   [provider]  aspirin EC 81 MG tablet Take 81 mg by mouth daily.    [provider]  atorvastatin (LIPITOR) 40 MG tablet Take 40 mg by mouth daily. 02/14/17   [provider]  cholecalciferol (VITAMIN D) 25 MCG (1000 UNIT) tablet Take 1,000 Units by mouth daily.    [provider]  clopidogrel (PLAVIX) 75 MG tablet Take 75 mg by mouth daily. 05/27/19   [provider]  cyclobenzaprine (FLEXERIL) 10 MG tablet Take 10 mg by mouth 4 (four) times daily as needed for muscle spasms. 09/06/18   [provider]  ferrous  sulfate 325 (65 FE) MG tablet Take 325 mg by mouth daily with breakfast.    [provider]  fluticasone (FLONASE) 50 MCG/ACT nasal spray Place 2 sprays into both nostrils daily.    [provider]  GARLIC PO Take 1 tablet by mouth daily.    [provider]  glipiZIDE (GLUCOTROL XL) 2.5 MG 24 hr tablet Take 2.5 mg by mouth every morning. 08/11/18   [provider]  hydrochlorothiazide (HYDRODIURIL) 12.5 MG tablet Take 12.5 mg by mouth daily. 04/24/19   [provider]  HYDROcodone-acetaminophen (NORCO) 7.5-325 MG tablet Take 1 tablet by mouth every 6 (six) hours as needed (pain). 01/12/17   [name], MD  LEVEMIR FLEXTOUCH 100 UNIT/ML Pen Inject 40 Units into the skin at bedtime. X 6 more nights, then return back to 22 units at bedtime Patient taking differently: Inject 32 Units into the skin every evening. 07/31/19   Orson Eva, MD  lidocaine-prilocaine (EMLA) cream Apply to the Port-A-Cath site 30-60 minutes before treatment. 05/20/22   Curt Bears, MD  lisinopril (ZESTRIL) 5 MG tablet Take 5 mg by mouth daily.    [provider]  LORazepam (ATIVAN) 1 MG tablet Take 1 mg by mouth every 6 (six) hours as needed for anxiety or sleep. 03/31/15   [provider]  pantoprazole (PROTONIX) 40 MG tablet Take 1 tablet (40 mg total) by mouth 2 (two) times daily before a meal. 04/06/22   Rourk, Cristopher Estimable, MD  prochlorperazine (COMPAZINE) 10 MG tablet Take 1 tablet (10 mg total) by mouth every 6 (six) hours as needed for nausea or vomiting. 05/20/22   Curt Bears, MD  senna (SENOKOT) 8.6 MG TABS tablet Take 1 tablet (8.6 mg total) by mouth at bedtime as needed for mild constipation. May be needed as pain medication can constipate. 01/12/17   [name], MD  Sennosides 25 MG TABS Take 25 mg by mouth daily as needed (severe constipation).    [provider]  sildenafil (REVATIO) 20 MG tablet Take 60 mg by mouth daily as needed (erectile  dysfunction).    [provider]  sildenafil (VIAGRA) 100 MG tablet Take 100 mg by mouth daily as needed for erectile dysfunction.    [provider]  tadalafil (CIALIS) 20 MG tablet Take 1 tablet (20 mg total)  by mouth daily as needed for erectile dysfunction. Take 30-60 minutes prior to intercourse 11/19/21   Stoneking, Reece Leader., MD  testosterone cypionate (DEPOTESTOSTERONE CYPIONATE) 200 MG/ML injection Inject 1.5 mLs into the muscle every 14 (fourteen) days. 05/22/22   [provider]  Testosterone Cypionate 200 MG/ML SOLN Inject 300 mg as directed every 14 (fourteen) days.    [provider]  traZODone (DESYREL) 50 MG tablet Take 50 mg by mouth at bedtime.    [provider]  umeclidinium-vilanterol (ANORO ELLIPTA) 62.5-25 MCG/ACT AEPB Inhale 1 puff into the lungs daily. 05/19/22   Spero Geralds, MD    Family History Family History  Problem Relation Age of Onset   Hypertension Mother    Colon cancer Neg Hx     Social History Social History   Tobacco Use   Smoking status: Former    Packs/day: 0.50    Years: 35.00    Total pack years: 17.50    Types: Cigarettes    Quit date: 03/01/2017    Years since quitting: 5.2   Smokeless tobacco: Never   Tobacco comments:    quit 02-2017  Vaping Use   Vaping Use: Never used  Substance Use Topics   Alcohol use: No    Comment: quit 2000 but relapse in 2016. no etoh since hospitalized 2016.    Drug use: No     Allergies   Patient has no known allergies.   Review of Systems Review of Systems Per HPI  Physical Exam Triage Vital Signs ED Triage Vitals  Enc Vitals Group     BP 06/13/22 0955 (!) 158/67     Pulse Rate 06/13/22 0955 (!) 117     Resp 06/13/22 0955 20     Temp 06/13/22 0955 99.6 F (37.6 C)     Temp Source 06/13/22 0955 Oral     SpO2 06/13/22 0955 90 %     Weight --      Height --      Head Circumference --      Peak Flow --      Pain Score 06/13/22 0957 5     Pain  Loc --      Pain Edu? --      Excl. in [place]? --    No data found.  Updated Vital Signs BP (!) 158/67 (BP Location: Right Arm)   Pulse (!) 117   Temp 99.6 F (37.6 C) (Oral)   Resp 20   SpO2 90% Comment: pt reports intermittent shortness of breath for last few days and reports baseline o2 93-94%.  Visual Acuity Right Eye Distance:   Left Eye Distance:   Bilateral Distance:    Right Eye Near:   Left Eye Near:    Bilateral Near:     Physical Exam Vitals and nursing note reviewed.  Constitutional:      Appearance: Normal appearance.  HENT:     Head: Atraumatic.     Mouth/Throat:     Mouth: Mucous membranes are moist.  Eyes:     Extraocular Movements: Extraocular movements intact.     Conjunctiva/sclera: Conjunctivae normal.  Cardiovascular:     Rate and Rhythm: Normal rate and regular rhythm.  Pulmonary:     Effort: Pulmonary effort is normal.     Breath sounds: No wheezing or rales.  Abdominal:     General: Bowel sounds are normal. There is no distension.     Palpations: Abdomen is soft.     Tenderness: There  is no abdominal tenderness. There is no right CVA tenderness, left CVA tenderness or guarding.  Musculoskeletal:        General: Normal range of motion.     Cervical back: Normal range of motion and neck supple.  Skin:    General: Skin is warm and dry.  Neurological:     General: No focal deficit present.     Mental Status: He is oriented to person, place, and time.     Motor: No weakness.     Gait: Gait normal.  Psychiatric:        Mood and Affect: Mood normal.        Thought Content: Thought content normal.        Judgment: Judgment normal.      UC Treatments / Results  Labs (all labs ordered are listed, but only abnormal results are displayed) Labs Reviewed - No data to display  EKG   Radiology No results found.  Procedures Procedures (including critical care time)  Medications Ordered in UC Medications - No data to display  Initial  Impression / Assessment and Plan / UC Course  I have reviewed the triage vital signs and the nursing notes.  Pertinent labs & imaging results that were available during my care of the patient were reviewed by me and considered in my medical decision making (see chart for details).     We will refill nystatin and Diflucan for yeast rash to the tip of the penis.  We will treat with Carafate in addition to his typical reflux regimen and over-the-counter remedies and discussed dietary changes additionally.  He declines any further work-up on this issue at this time and plans to follow-up with oncologist tomorrow.  Turn for worsening symptoms.  Final Clinical Impressions(s) / UC Diagnoses   Final diagnoses:  Cutaneous candidiasis  Gastroesophageal reflux disease, unspecified whether esophagitis present  Abdominal pain, epigastric   Discharge Instructions   None    ED Prescriptions     Medication Sig Dispense Auth. Provider   nystatin cream (MYCOSTATIN) Apply to affected area 2 times daily 80 g Volney American, PA-C   fluconazole (DIFLUCAN) 150 MG tablet Take 1 tablet (150 mg total) by mouth every other day. 3 tablet Volney American, PA-C   sucralfate (CARAFATE) 1 g tablet Take 1 tablet (1 g total) by mouth 3 (three) times daily as needed. May dissolve 1 tab and glass of water and drink as needed 90 tablet Volney American, [place]      PDMP not reviewed this encounter.   Volney American, [place] 06/13/22 [phone]

## 2022-06-14 ENCOUNTER — Inpatient Hospital Stay: Payer: Medicare Other

## 2022-06-14 ENCOUNTER — Ambulatory Visit: Payer: Medicare Other

## 2022-06-14 ENCOUNTER — Inpatient Hospital Stay (HOSPITAL_BASED_OUTPATIENT_CLINIC_OR_DEPARTMENT_OTHER): Payer: Medicare Other | Admitting: Physician Assistant

## 2022-06-14 ENCOUNTER — Other Ambulatory Visit: Payer: Self-pay

## 2022-06-14 ENCOUNTER — Ambulatory Visit
Admission: RE | Admit: 2022-06-14 | Discharge: 2022-06-14 | Disposition: A | Discharge status: Home / Self Care | Payer: Medicare Other | Source: Ambulatory Visit | Attending: Radiation Oncology | Admitting: Radiation Oncology

## 2022-06-14 VITALS — BP 134/70 | HR 101 | Resp 18

## 2022-06-14 VITALS — BP 121/64 | HR 92 | Temp 98.2°F | Resp 17 | Wt 188.2 lb

## 2022-06-14 DIAGNOSIS — Z5111 Encounter for antineoplastic chemotherapy: Secondary | ICD-10-CM | POA: Diagnosis not present

## 2022-06-14 DIAGNOSIS — Z794 Long term (current) use of insulin: Secondary | ICD-10-CM

## 2022-06-14 DIAGNOSIS — C3411 Malignant neoplasm of upper lobe, right bronchus or lung: Secondary | ICD-10-CM | POA: Diagnosis not present

## 2022-06-14 DIAGNOSIS — Z95828 Presence of other vascular implants and grafts: Secondary | ICD-10-CM

## 2022-06-14 DIAGNOSIS — E11319 Type 2 diabetes mellitus with unspecified diabetic retinopathy without macular edema: Secondary | ICD-10-CM

## 2022-06-14 DIAGNOSIS — R739 Hyperglycemia, unspecified: Secondary | ICD-10-CM | POA: Diagnosis not present

## 2022-06-14 LAB — RAD ONC ARIA SESSION SUMMARY
Course Elapsed Days: 19
Plan Fractions Treated to Date: 14
Plan Prescribed Dose Per Fraction: 2 Gy
Plan Total Fractions Prescribed: 30
Plan Total Prescribed Dose: 60 Gy
Reference Point Dosage Given to Date: 28 Gy
Reference Point Session Dosage Given: 2 Gy
Session Number: 14

## 2022-06-14 LAB — CBC WITH DIFFERENTIAL (CANCER CENTER ONLY)
Abs Immature Granulocytes: 0.01 10*3/uL (ref 0.00–0.07)
Basophils Absolute: 0 10*3/uL (ref 0.0–0.1)
Basophils Relative: 0 %
Eosinophils Absolute: 0 10*3/uL (ref 0.0–0.5)
Eosinophils Relative: 1 %
HCT: 32.2 % — ABNORMAL LOW (ref 39.0–52.0)
Hemoglobin: 10.2 g/dL — ABNORMAL LOW (ref 13.0–17.0)
Immature Granulocytes: 0 %
Lymphocytes Relative: 3 %
Lymphs Abs: 0.1 10*3/uL — ABNORMAL LOW (ref 0.7–4.0)
MCH: 25.4 pg — ABNORMAL LOW (ref 26.0–34.0)
MCHC: 31.7 g/dL (ref 30.0–36.0)
MCV: 80.3 fL (ref 80.0–100.0)
Monocytes Absolute: 0.2 10*3/uL (ref 0.1–1.0)
Monocytes Relative: 7 %
Neutro Abs: 2.9 10*3/uL (ref 1.7–7.7)
Neutrophils Relative %: 89 %
Platelet Count: 309 10*3/uL (ref 150–400)
RBC: 4.01 MIL/uL — ABNORMAL LOW (ref 4.22–5.81)
RDW: 15.9 % — ABNORMAL HIGH (ref 11.5–15.5)
WBC Count: 3.3 10*3/uL — ABNORMAL LOW (ref 4.0–10.5)
nRBC: 0 % (ref 0.0–0.2)

## 2022-06-14 LAB — CMP (CANCER CENTER ONLY)
ALT: 10 U/L (ref 0–44)
AST: 11 U/L — ABNORMAL LOW (ref 15–41)
Albumin: 3.2 g/dL — ABNORMAL LOW (ref 3.5–5.0)
Alkaline Phosphatase: 56 U/L (ref 38–126)
Anion gap: 5 (ref 5–15)
BUN: 26 mg/dL — ABNORMAL HIGH (ref 8–23)
CO2: 33 mmol/L — ABNORMAL HIGH (ref 22–32)
Calcium: 8.8 mg/dL — ABNORMAL LOW (ref 8.9–10.3)
Chloride: 94 mmol/L — ABNORMAL LOW (ref 98–111)
Creatinine: 1.61 mg/dL — ABNORMAL HIGH (ref 0.61–1.24)
GFR, Estimated: 45 mL/min — ABNORMAL LOW (ref >60)
Glucose, Bld: 290 mg/dL — ABNORMAL HIGH (ref 70–99)
Potassium: 4.4 mmol/L (ref 3.5–5.1)
Sodium: 132 mmol/L — ABNORMAL LOW (ref 135–145)
Total Bilirubin: 0.3 mg/dL (ref 0.3–1.2)
Total Protein: 6.5 g/dL (ref 6.5–8.1)

## 2022-06-14 MED ORDER — HEPARIN SOD (PORK) LOCK FLUSH 100 UNIT/ML IV SOLN
500.0000 [IU] | Freq: Once | INTRAVENOUS | Status: AC | PRN
  Administered 2022-06-14: 500 [IU]

## 2022-06-14 MED ORDER — FAMOTIDINE IN NACL 20-0.9 MG/50ML-% IV SOLN
20.0000 mg | Freq: Once | INTRAVENOUS | Status: AC
  Administered 2022-06-14: 20 mg via INTRAVENOUS
  Filled 2022-06-14: qty 50

## 2022-06-14 MED ORDER — SODIUM CHLORIDE 0.9 % IV SOLN: Freq: Once | INTRAVENOUS | Status: AC

## 2022-06-14 MED ORDER — INSULIN ASPART 100 UNIT/ML IJ SOLN
5.0000 [IU] | Freq: Once | INTRAMUSCULAR | Status: AC
  Administered 2022-06-14: 5 [IU] via SUBCUTANEOUS
  Filled 2022-06-14: qty 1

## 2022-06-14 MED ORDER — SODIUM CHLORIDE 0.9 % IV SOLN
45.0000 mg/m2 | Freq: Once | INTRAVENOUS | Status: AC
  Administered 2022-06-14: 90 mg via INTRAVENOUS
  Filled 2022-06-14: qty 15

## 2022-06-14 MED ORDER — CETIRIZINE HCL 10 MG/ML IV SOLN
10.0000 mg | Freq: Once | INTRAVENOUS | Status: AC
  Administered 2022-06-14: 10 mg via INTRAVENOUS
  Filled 2022-06-14: qty 1

## 2022-06-14 MED ORDER — SODIUM CHLORIDE 0.9 % IV SOLN
146.8000 mg | Freq: Once | INTRAVENOUS | Status: AC
  Administered 2022-06-14: 150 mg via INTRAVENOUS
  Filled 2022-06-14: qty 15

## 2022-06-14 MED ORDER — SODIUM CHLORIDE 0.9% FLUSH
10.0000 mL | INTRAVENOUS | Status: AC | PRN
  Administered 2022-06-14: 10 mL

## 2022-06-14 MED ORDER — SODIUM CHLORIDE 0.9 % IV SOLN
10.0000 mg | Freq: Once | INTRAVENOUS | Status: AC
  Administered 2022-06-14: 10 mg via INTRAVENOUS
  Filled 2022-06-14: qty 10

## 2022-06-14 MED ORDER — SODIUM CHLORIDE 0.9% FLUSH
10.0000 mL | INTRAVENOUS | Status: DC | PRN
  Administered 2022-06-14: 10 mL

## 2022-06-14 MED ORDER — PALONOSETRON HCL INJECTION 0.25 MG/5ML
0.2500 mg | Freq: Once | INTRAVENOUS | Status: AC
  Administered 2022-06-14: 0.25 mg via INTRAVENOUS
  Filled 2022-06-14: qty 5

## 2022-06-14 NOTE — Progress Notes (Signed)
Per Cassie, PA ok to proceed with tx with Scr 1.61.

## 2022-06-14 NOTE — Patient Instructions (Signed)
[facility]  Discharge Instructions: Thank you for choosing [facility] to provide your oncology and hematology care.   If you have a lab appointment with the [facility], please go directly to the [facility] and check in at the registration area.   Wear comfortable clothing and clothing appropriate for easy access to any Portacath or PICC line.   We strive to give you quality time with your provider. You may need to reschedule your appointment if you arrive late (15 or more minutes).  Arriving late affects you and other patients whose appointments are after yours.  Also, if you miss three or more appointments without notifying the office, you may be dismissed from the clinic at the provider's discretion.      For prescription refill requests, have your pharmacy contact our office and allow 72 hours for refills to be completed.    Today you received the following chemotherapy and/or immunotherapy agents Taxol/Carbo      To help prevent nausea and vomiting after your treatment, we encourage you to take your nausea medication as directed.  BELOW ARE SYMPTOMS THAT SHOULD BE REPORTED IMMEDIATELY: *FEVER GREATER THAN 100.4 F (38 C) OR HIGHER *CHILLS OR SWEATING *NAUSEA AND VOMITING THAT IS NOT CONTROLLED WITH YOUR NAUSEA MEDICATION *UNUSUAL SHORTNESS OF BREATH *UNUSUAL BRUISING OR BLEEDING *URINARY PROBLEMS (pain or burning when urinating, or frequent urination) *BOWEL PROBLEMS (unusual diarrhea, constipation, pain near the anus) TENDERNESS IN MOUTH AND THROAT WITH OR WITHOUT PRESENCE OF ULCERS (sore throat, sores in mouth, or a toothache) UNUSUAL RASH, SWELLING OR PAIN  UNUSUAL VAGINAL DISCHARGE OR ITCHING   Items with * indicate a potential emergency and should be followed up as soon as possible or go to the Emergency Department if any problems should occur.  Please show the CHEMOTHERAPY ALERT CARD or IMMUNOTHERAPY ALERT CARD at check-in to  the Emergency Department and triage nurse.  Should you have questions after your visit or need to cancel or reschedule your appointment, please contact [facility]  Dept: [phone]  and follow the prompts.  Office hours are 8:00 a.m. to 4:30 p.m. Monday - Friday. Please note that voicemails left after 4:00 p.m. may not be returned until the following business day.  We are closed weekends and major holidays. You have access to a nurse at all times for urgent questions. Please call the main number to the clinic Dept: [phone] and follow the prompts.   For any non-urgent questions, you may also contact your provider using MyChart. We now offer e-Visits for anyone 29 and older to request care online for non-urgent symptoms. For details visit [email or web address].   Also download the MyChart app! Go to the app store, search "MyChart", open the app, select [facility], and log in with your MyChart username and password.  Masks are optional in the cancer centers. If you would like for your care team to wear a mask while they are taking care of you, please let them know. You may have one support person who is at least 72 years old accompany you for your appointments.

## 2022-06-15 ENCOUNTER — Other Ambulatory Visit: Payer: Self-pay

## 2022-06-15 ENCOUNTER — Ambulatory Visit
Admission: RE | Admit: 2022-06-15 | Discharge: 2022-06-15 | Disposition: A | Discharge status: Home / Self Care | Payer: Medicare Other | Source: Ambulatory Visit | Attending: Radiation Oncology | Admitting: Radiation Oncology

## 2022-06-15 DIAGNOSIS — Z5111 Encounter for antineoplastic chemotherapy: Secondary | ICD-10-CM | POA: Diagnosis not present

## 2022-06-15 LAB — RAD ONC ARIA SESSION SUMMARY
Course Elapsed Days: 20
Plan Fractions Treated to Date: 15
Plan Prescribed Dose Per Fraction: 2 Gy
Plan Total Fractions Prescribed: 30
Plan Total Prescribed Dose: 60 Gy
Reference Point Dosage Given to Date: 30 Gy
Reference Point Session Dosage Given: 2 Gy
Session Number: 15

## 2022-06-16 ENCOUNTER — Other Ambulatory Visit: Payer: Self-pay | Admitting: Radiation Oncology

## 2022-06-16 ENCOUNTER — Other Ambulatory Visit: Payer: Self-pay

## 2022-06-16 ENCOUNTER — Ambulatory Visit
Admission: RE | Admit: 2022-06-16 | Discharge: 2022-06-16 | Disposition: A | Discharge status: Home / Self Care | Payer: Medicare Other | Source: Ambulatory Visit | Attending: Radiation Oncology | Admitting: Radiation Oncology

## 2022-06-16 ENCOUNTER — Telehealth: Payer: Self-pay

## 2022-06-16 DIAGNOSIS — C3411 Malignant neoplasm of upper lobe, right bronchus or lung: Secondary | ICD-10-CM

## 2022-06-16 DIAGNOSIS — Z5111 Encounter for antineoplastic chemotherapy: Secondary | ICD-10-CM | POA: Diagnosis not present

## 2022-06-16 LAB — RAD ONC ARIA SESSION SUMMARY
Course Elapsed Days: 21
Plan Fractions Treated to Date: 16
Plan Prescribed Dose Per Fraction: 2 Gy
Plan Total Fractions Prescribed: 30
Plan Total Prescribed Dose: 60 Gy
Reference Point Dosage Given to Date: 32 Gy
Reference Point Session Dosage Given: 2 Gy
Session Number: 16

## 2022-06-16 MED ORDER — LIDOCAINE VISCOUS HCL 2 % MT SOLN: OROMUCOSAL | 3 refills | Status: DC

## 2022-06-16 NOTE — Telephone Encounter (Signed)
Mr. Sivley came by after treatment today and requested medication refills. Dr. Amanda Cockayne and made aware of these request. Dr. Isidore Moos did refill the viscous lidocaine however encouraged him to reach out to his primary care provider or medical oncologist to get refills of pain medications. Pt also requested a treatment note which will be left for him tomorrow.

## 2022-06-17 ENCOUNTER — Inpatient Hospital Stay: Payer: Medicare Other | Admitting: Dietician

## 2022-06-17 ENCOUNTER — Other Ambulatory Visit: Payer: Self-pay

## 2022-06-17 ENCOUNTER — Ambulatory Visit
Admission: RE | Admit: 2022-06-17 | Discharge: 2022-06-17 | Disposition: A | Discharge status: Home / Self Care | Payer: Medicare Other | Source: Ambulatory Visit | Attending: Radiation Oncology | Admitting: Radiation Oncology

## 2022-06-17 DIAGNOSIS — Z5111 Encounter for antineoplastic chemotherapy: Secondary | ICD-10-CM | POA: Diagnosis not present

## 2022-06-17 LAB — RAD ONC ARIA SESSION SUMMARY
Course Elapsed Days: 22
Plan Fractions Treated to Date: 17
Plan Prescribed Dose Per Fraction: 2 Gy
Plan Total Fractions Prescribed: 30
Plan Total Prescribed Dose: 60 Gy
Reference Point Dosage Given to Date: 34 Gy
Reference Point Session Dosage Given: 2 Gy
Session Number: 17

## 2022-06-18 ENCOUNTER — Telehealth: Payer: Self-pay

## 2022-06-18 ENCOUNTER — Other Ambulatory Visit: Payer: Self-pay

## 2022-06-18 ENCOUNTER — Other Ambulatory Visit: Payer: Self-pay | Admitting: Radiation Oncology

## 2022-06-18 ENCOUNTER — Ambulatory Visit
Admission: RE | Admit: 2022-06-18 | Discharge: 2022-06-18 | Disposition: A | Discharge status: Home / Self Care | Payer: Medicare Other | Source: Ambulatory Visit | Attending: Radiation Oncology | Admitting: Radiation Oncology

## 2022-06-18 DIAGNOSIS — C3411 Malignant neoplasm of upper lobe, right bronchus or lung: Secondary | ICD-10-CM

## 2022-06-18 DIAGNOSIS — Z5111 Encounter for antineoplastic chemotherapy: Secondary | ICD-10-CM | POA: Diagnosis not present

## 2022-06-18 LAB — RAD ONC ARIA SESSION SUMMARY
Course Elapsed Days: 23
Plan Fractions Treated to Date: 18
Plan Prescribed Dose Per Fraction: 2 Gy
Plan Total Fractions Prescribed: 30
Plan Total Prescribed Dose: 60 Gy
Reference Point Dosage Given to Date: 36 Gy
Reference Point Session Dosage Given: 2 Gy
Session Number: 18

## 2022-06-18 MED ORDER — HYDROCODONE-ACETAMINOPHEN 7.5-325 MG/15ML PO SOLN: 10.0000 mL | Freq: Four times a day (QID) | ORAL | 0 refills | Status: DC | PRN

## 2022-06-18 MED ORDER — HYDROCODONE-ACETAMINOPHEN 7.5-325 MG/15ML PO SOLN: 10.0000 mL | ORAL | 0 refills | Status: DC | PRN

## 2022-06-18 MED FILL — Dexamethasone Sodium Phosphate Inj 100 MG/10ML: INTRAMUSCULAR | Qty: 1 | Status: AC

## 2022-06-18 NOTE — Telephone Encounter (Signed)
Rn Colin Wagner called pt to inform him that his pain medication had been called in to [facility] in [place]. He was also informed that an appetite stimulant was not necessary right now per md. He stated he is already seeing a dietician next week.

## 2022-06-18 NOTE — Telephone Encounter (Signed)
Patient presented to lobby requesting stronger pain medication . Pain consistently 8-9/10 in are around radiation site. Patient currently receiving Norco prescribed by Dr. Isidore Moos. Spoke with Tandy Gaw RN.   Routed message to Dr. Isidore Moos.

## 2022-06-21 ENCOUNTER — Other Ambulatory Visit: Payer: Self-pay | Admitting: Radiation Oncology

## 2022-06-21 ENCOUNTER — Inpatient Hospital Stay: Payer: Medicare Other

## 2022-06-21 ENCOUNTER — Other Ambulatory Visit: Payer: Self-pay

## 2022-06-21 ENCOUNTER — Ambulatory Visit: Payer: Medicare Other

## 2022-06-21 ENCOUNTER — Ambulatory Visit: Payer: Medicare Other | Admitting: Nutrition

## 2022-06-21 ENCOUNTER — Ambulatory Visit
Admission: RE | Admit: 2022-06-21 | Discharge: 2022-06-21 | Disposition: A | Discharge status: Home / Self Care | Payer: Medicare Other | Source: Ambulatory Visit | Attending: Radiation Oncology | Admitting: Radiation Oncology

## 2022-06-21 ENCOUNTER — Encounter: Payer: Self-pay | Admitting: Medical Oncology

## 2022-06-21 VITALS — BP 132/73 | HR 99 | Temp 98.7°F | Resp 18 | Wt 181.4 lb

## 2022-06-21 DIAGNOSIS — C3411 Malignant neoplasm of upper lobe, right bronchus or lung: Secondary | ICD-10-CM

## 2022-06-21 DIAGNOSIS — Z5111 Encounter for antineoplastic chemotherapy: Secondary | ICD-10-CM | POA: Diagnosis not present

## 2022-06-21 LAB — RAD ONC ARIA SESSION SUMMARY
Course Elapsed Days: 26
Plan Fractions Treated to Date: 19
Plan Prescribed Dose Per Fraction: 2 Gy
Plan Total Fractions Prescribed: 30
Plan Total Prescribed Dose: 60 Gy
Reference Point Dosage Given to Date: 38 Gy
Reference Point Session Dosage Given: 2 Gy
Session Number: 19

## 2022-06-21 LAB — CMP (CANCER CENTER ONLY)
ALT: 11 U/L (ref 0–44)
AST: 11 U/L — ABNORMAL LOW (ref 15–41)
Albumin: 3.5 g/dL (ref 3.5–5.0)
Alkaline Phosphatase: 65 U/L (ref 38–126)
Anion gap: 6 (ref 5–15)
BUN: 26 mg/dL — ABNORMAL HIGH (ref 8–23)
CO2: 29 mmol/L (ref 22–32)
Calcium: 9.1 mg/dL (ref 8.9–10.3)
Chloride: 99 mmol/L (ref 98–111)
Creatinine: 1.68 mg/dL — ABNORMAL HIGH (ref 0.61–1.24)
GFR, Estimated: 43 mL/min — ABNORMAL LOW (ref >60)
Glucose, Bld: 188 mg/dL — ABNORMAL HIGH (ref 70–99)
Potassium: 4.4 mmol/L (ref 3.5–5.1)
Sodium: 134 mmol/L — ABNORMAL LOW (ref 135–145)
Total Bilirubin: 0.3 mg/dL (ref 0.3–1.2)
Total Protein: 7.1 g/dL (ref 6.5–8.1)

## 2022-06-21 LAB — CBC WITH DIFFERENTIAL (CANCER CENTER ONLY)
Abs Immature Granulocytes: 0.01 10*3/uL (ref 0.00–0.07)
Basophils Absolute: 0 10*3/uL (ref 0.0–0.1)
Basophils Relative: 1 %
Eosinophils Absolute: 0 10*3/uL (ref 0.0–0.5)
Eosinophils Relative: 1 %
HCT: 34.8 % — ABNORMAL LOW (ref 39.0–52.0)
Hemoglobin: 11.2 g/dL — ABNORMAL LOW (ref 13.0–17.0)
Immature Granulocytes: 0 %
Lymphocytes Relative: 3 %
Lymphs Abs: 0.1 10*3/uL — ABNORMAL LOW (ref 0.7–4.0)
MCH: 25.7 pg — ABNORMAL LOW (ref 26.0–34.0)
MCHC: 32.2 g/dL (ref 30.0–36.0)
MCV: 80 fL (ref 80.0–100.0)
Monocytes Absolute: 0.2 10*3/uL (ref 0.1–1.0)
Monocytes Relative: 7 %
Neutro Abs: 2.8 10*3/uL (ref 1.7–7.7)
Neutrophils Relative %: 88 %
Platelet Count: 304 10*3/uL (ref 150–400)
RBC: 4.35 MIL/uL (ref 4.22–5.81)
RDW: 16.3 % — ABNORMAL HIGH (ref 11.5–15.5)
WBC Count: 3.2 10*3/uL — ABNORMAL LOW (ref 4.0–10.5)
nRBC: 0 % (ref 0.0–0.2)

## 2022-06-21 MED ORDER — SODIUM CHLORIDE 0.9% FLUSH
10.0000 mL | INTRAVENOUS | Status: DC | PRN
  Administered 2022-06-21: 10 mL via INTRAVENOUS

## 2022-06-21 MED ORDER — PALONOSETRON HCL INJECTION 0.25 MG/5ML
0.2500 mg | Freq: Once | INTRAVENOUS | Status: AC
  Administered 2022-06-21: 0.25 mg via INTRAVENOUS
  Filled 2022-06-21: qty 5

## 2022-06-21 MED ORDER — SODIUM CHLORIDE 0.9 % IV SOLN
45.0000 mg/m2 | Freq: Once | INTRAVENOUS | Status: AC
  Administered 2022-06-21: 90 mg via INTRAVENOUS
  Filled 2022-06-21: qty 15

## 2022-06-21 MED ORDER — FAMOTIDINE IN NACL 20-0.9 MG/50ML-% IV SOLN
20.0000 mg | Freq: Once | INTRAVENOUS | Status: AC
  Administered 2022-06-21: 20 mg via INTRAVENOUS
  Filled 2022-06-21: qty 50

## 2022-06-21 MED ORDER — SODIUM CHLORIDE 0.9 % IV SOLN
10.0000 mg | Freq: Once | INTRAVENOUS | Status: AC
  Administered 2022-06-21: 10 mg via INTRAVENOUS
  Filled 2022-06-21: qty 10

## 2022-06-21 MED ORDER — SODIUM CHLORIDE 0.9 % IV SOLN
146.8000 mg | Freq: Once | INTRAVENOUS | Status: AC
  Administered 2022-06-21: 150 mg via INTRAVENOUS
  Filled 2022-06-21: qty 15

## 2022-06-21 MED ORDER — HEPARIN SOD (PORK) LOCK FLUSH 100 UNIT/ML IV SOLN
500.0000 [IU] | Freq: Once | INTRAVENOUS | Status: AC | PRN
  Administered 2022-06-21: 500 [IU]

## 2022-06-21 MED ORDER — SODIUM CHLORIDE 0.9 % IV SOLN: Freq: Once | INTRAVENOUS | Status: AC

## 2022-06-21 MED ORDER — SODIUM CHLORIDE 0.9% FLUSH
10.0000 mL | INTRAVENOUS | Status: DC | PRN
  Administered 2022-06-21: 10 mL

## 2022-06-21 MED ORDER — CETIRIZINE HCL 10 MG/ML IV SOLN
10.0000 mg | Freq: Once | INTRAVENOUS | Status: AC
  Administered 2022-06-21: 10 mg via INTRAVENOUS
  Filled 2022-06-21: qty 1

## 2022-06-21 NOTE — Progress Notes (Signed)
Patient is a 71 year old male diagnosed with stage IIIc non-small cell lung cancer.  He is receiving concurrent chemoradiation therapy with weekly carboplatin/paclitaxel.  He is followed by Dr. Julien Nordmann and Dr. Isidore Moos.  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  Medications include vitamin D, ferrous sulfate, Diflucan, garlic, Glucotrol XL, Ativan, Protonix, Compazine, Senokot, Carafate.  Labs include sodium 134, glucose 188, BUN 26, creatinine 1.68.  Height: 5 feet 11 inches. Weight: 181 pounds 6.4 ounces. Patient weighed 191 pounds on October 9. BMI: 25.3.  Patient reportedly missed nutrition consult on October 20.  He is requesting to speak with RD today.  I met with patient and significant other during chemotherapy.  He reports he can swallow without difficulty except for foods such as hamburgers or cornbread.  He states weight loss is secondary to poor appetite.  He denies nausea, vomiting, constipation, and diarrhea.  He is requesting oral nutrition supplements samples.  I do not carry cases of Glucerna supplements.  Educated he could purchase these to provide better blood sugar control however I will provide a case of Ensure Plus high-protein at his request.  Explained higher CBGs may need to be monitored and treated with additional medication.  Nutrition diagnosis: Unintended weight loss related to cancer and associated treatments as evidenced by 5% weight loss over 3 weeks which is clinically significant.  Intervention: Educated patient on strategies for increasing appetite and encouraged him to eat smaller amounts of food more often. Reviewed high-calorie, high-protein foods and provided nutrition facts sheets. Encourage patient to try oral nutrition supplements.  Provided 1 complementary case of  Ensure Plus high-protein.  Also gave patient coupons. Questions answered.  Contact information provided.  Monitoring, evaluation, goals: Patient will tolerate increased calories and protein to minimize further weight loss.  Next visit: Monday, November 6.  **Disclaimer: This note was dictated with voice recognition software. Similar sounding words can inadvertently be transcribed and this note may contain transcription errors which may not have been corrected upon publication of note.**

## 2022-06-21 NOTE — Patient Instructions (Signed)
[facility]  Discharge Instructions: Thank you for choosing [facility] to provide your oncology and hematology care.   If you have a lab appointment with the [facility], please go directly to the [facility] and check in at the registration area.   Wear comfortable clothing and clothing appropriate for easy access to any Portacath or PICC line.   We strive to give you quality time with your provider. You may need to reschedule your appointment if you arrive late (15 or more minutes).  Arriving late affects you and other patients whose appointments are after yours.  Also, if you miss three or more appointments without notifying the office, you may be dismissed from the clinic at the provider's discretion.      For prescription refill requests, have your pharmacy contact our office and allow 72 hours for refills to be completed.    Today you received the following chemotherapy and/or immunotherapy agents: Paclitaxel and Carboplatin      To help prevent nausea and vomiting after your treatment, we encourage you to take your nausea medication as directed.  BELOW ARE SYMPTOMS THAT SHOULD BE REPORTED IMMEDIATELY: *FEVER GREATER THAN 100.4 F (38 C) OR HIGHER *CHILLS OR SWEATING *NAUSEA AND VOMITING THAT IS NOT CONTROLLED WITH YOUR NAUSEA MEDICATION *UNUSUAL SHORTNESS OF BREATH *UNUSUAL BRUISING OR BLEEDING *URINARY PROBLEMS (pain or burning when urinating, or frequent urination) *BOWEL PROBLEMS (unusual diarrhea, constipation, pain near the anus) TENDERNESS IN MOUTH AND THROAT WITH OR WITHOUT PRESENCE OF ULCERS (sore throat, sores in mouth, or a toothache) UNUSUAL RASH, SWELLING OR PAIN  UNUSUAL VAGINAL DISCHARGE OR ITCHING   Items with * indicate a potential emergency and should be followed up as soon as possible or go to the Emergency Department if any problems should occur.  Please show the CHEMOTHERAPY ALERT CARD or IMMUNOTHERAPY ALERT  CARD at check-in to the Emergency Department and triage nurse.  Should you have questions after your visit or need to cancel or reschedule your appointment, please contact [facility]  Dept: [phone]  and follow the prompts.  Office hours are 8:00 a.m. to 4:30 p.m. Monday - Friday. Please note that voicemails left after 4:00 p.m. may not be returned until the following business day.  We are closed weekends and major holidays. You have access to a nurse at all times for urgent questions. Please call the main number to the clinic Dept: [phone] and follow the prompts.   For any non-urgent questions, you may also contact your provider using MyChart. We now offer e-Visits for anyone 49 and older to request care online for non-urgent symptoms. For details visit [email or web address].   Also download the MyChart app! Go to the app store, search "MyChart", open the app, select [facility], and log in with your MyChart username and password.  Masks are optional in the cancer centers. If you would like for your care team to wear a mask while they are taking care of you, please let them know. You may have one support person who is at least 72 years old accompany you for your appointments.

## 2022-06-21 NOTE — Progress Notes (Signed)
Per Dr Julien Nordmann, ok to treat with creat 1.68 mg/dL

## 2022-06-22 ENCOUNTER — Encounter: Payer: Self-pay | Admitting: *Deleted

## 2022-06-22 ENCOUNTER — Ambulatory Visit
Admission: RE | Admit: 2022-06-22 | Discharge: 2022-06-22 | Disposition: A | Discharge status: Home / Self Care | Payer: Medicare Other | Source: Ambulatory Visit | Attending: Radiation Oncology | Admitting: Radiation Oncology

## 2022-06-22 ENCOUNTER — Encounter: Payer: Medicaid Other | Admitting: Nurse Practitioner

## 2022-06-22 ENCOUNTER — Other Ambulatory Visit: Payer: Self-pay

## 2022-06-22 DIAGNOSIS — Z5111 Encounter for antineoplastic chemotherapy: Secondary | ICD-10-CM | POA: Diagnosis not present

## 2022-06-22 LAB — RAD ONC ARIA SESSION SUMMARY
Course Elapsed Days: 27
Plan Fractions Treated to Date: 20
Plan Prescribed Dose Per Fraction: 2 Gy
Plan Total Fractions Prescribed: 30
Plan Total Prescribed Dose: 60 Gy
Reference Point Dosage Given to Date: 40 Gy
Reference Point Session Dosage Given: 2 Gy
Session Number: 20

## 2022-06-23 ENCOUNTER — Ambulatory Visit: Payer: Medicaid Other | Admitting: Internal Medicine

## 2022-06-23 ENCOUNTER — Ambulatory Visit
Admission: RE | Admit: 2022-06-23 | Discharge: 2022-06-23 | Disposition: A | Discharge status: Home / Self Care | Payer: Medicare Other | Source: Ambulatory Visit | Attending: Radiation Oncology | Admitting: Radiation Oncology

## 2022-06-23 ENCOUNTER — Other Ambulatory Visit: Payer: Self-pay

## 2022-06-23 DIAGNOSIS — C3411 Malignant neoplasm of upper lobe, right bronchus or lung: Secondary | ICD-10-CM | POA: Insufficient documentation

## 2022-06-23 LAB — RAD ONC ARIA SESSION SUMMARY
Course Elapsed Days: 28
Plan Fractions Treated to Date: 21
Plan Prescribed Dose Per Fraction: 2 Gy
Plan Total Fractions Prescribed: 30
Plan Total Prescribed Dose: 60 Gy
Reference Point Dosage Given to Date: 42 Gy
Reference Point Session Dosage Given: 2 Gy
Session Number: 21

## 2022-06-24 ENCOUNTER — Other Ambulatory Visit: Payer: Self-pay

## 2022-06-24 ENCOUNTER — Ambulatory Visit
Admission: RE | Admit: 2022-06-24 | Discharge: 2022-06-24 | Disposition: A | Discharge status: Home / Self Care | Payer: Medicare Other | Source: Ambulatory Visit | Attending: Radiation Oncology | Admitting: Radiation Oncology

## 2022-06-24 DIAGNOSIS — C3411 Malignant neoplasm of upper lobe, right bronchus or lung: Secondary | ICD-10-CM | POA: Diagnosis not present

## 2022-06-24 LAB — RAD ONC ARIA SESSION SUMMARY
Course Elapsed Days: 29
Plan Fractions Treated to Date: 22
Plan Prescribed Dose Per Fraction: 2 Gy
Plan Total Fractions Prescribed: 30
Plan Total Prescribed Dose: 60 Gy
Reference Point Dosage Given to Date: 44 Gy
Reference Point Session Dosage Given: 2 Gy
Session Number: 22

## 2022-06-25 ENCOUNTER — Ambulatory Visit
Admission: RE | Admit: 2022-06-25 | Discharge: 2022-06-25 | Disposition: A | Discharge status: Home / Self Care | Payer: Medicare Other | Source: Ambulatory Visit | Attending: Radiation Oncology | Admitting: Radiation Oncology

## 2022-06-25 ENCOUNTER — Other Ambulatory Visit: Payer: Self-pay

## 2022-06-25 DIAGNOSIS — C3411 Malignant neoplasm of upper lobe, right bronchus or lung: Secondary | ICD-10-CM | POA: Diagnosis not present

## 2022-06-25 LAB — RAD ONC ARIA SESSION SUMMARY
Course Elapsed Days: 30
Plan Fractions Treated to Date: 23
Plan Prescribed Dose Per Fraction: 2 Gy
Plan Total Fractions Prescribed: 30
Plan Total Prescribed Dose: 60 Gy
Reference Point Dosage Given to Date: 46 Gy
Reference Point Session Dosage Given: 2 Gy
Session Number: 23

## 2022-06-25 MED FILL — Dexamethasone Sodium Phosphate Inj 100 MG/10ML: INTRAMUSCULAR | Qty: 1 | Status: AC

## 2022-06-25 NOTE — Progress Notes (Unsigned)
[facility] OFFICE PROGRESS NOTE  Colin Evens, MD [place] [address]  DIAGNOSIS: Stage IIIc (T4, N3, M0) non-small cell lung cancer, squamous cell carcinoma presented with large right upper lobe lung mass in addition to mediastinal and bilateral hilar lymphadenopathy diagnosed in September 2023.   PRIOR THERAPY: None  CURRENT THERAPY: A course of concurrent chemoradiation with weekly carboplatin for AUC of 2 and paclitaxel 45 Mg/M2.  First dose May 31, 2022. Status post 4 cycles.    INTERVAL HISTORY: Colin Wagner 71 y.o. male returns to the clinic today for a follow-up visit accompanied by his girlfriend.  The patient was diagnosed with stage III lung cancer a few months ago.  He is currently undergoing a course of concurrent chemoradiation.  He is status post 5 weeks of treatment.  His tentative last day of chemotherapy will be next week on Monday as long as his labs permit.  His last day radiation is scheduled for 07/06/2022.  Overall, the patient is feeling well today.  He denies any fever, chills, or night sweats.  He was previously being followed by member the nutritionist team.  His weight is 183 lbs today.  He reports a lack of appetite but attempts to drink a few Ensures a day. He reports his chest feels "sore" which they feel is from the radiation. He additional reports he awoke with a sore throat this morning. He sees Dr. Isidore Moos today. He is currently taking carafate.    He reports stable dyspnea on exertion and  cough.  He denies any chest pain or hemoptysis.  He denies any vomiting, or diarrhea.  Requests a refill on nausea medication which he takes at night. Constipation is controlled with medication. He reports abdominal pain in the area of his epigastric/xiphoid process. His girlfriend reports that have an appointment with a gastroenterology next week.   He reports worsening headaches beginning a few weeks ago. He denies changes in his  vision.   He sees a nephrologist for his chronic kidney disease.  He also has diabetes.    He is here today for evaluation and repeat blood work before considering starting cycle #5.      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    Right shoulder pain    Testicular hypofunction     ALLERGIES:  has No Known Allergies.  MEDICATIONS:  Current Outpatient Medications  Medication Sig Dispense Refill   acetaminophen (TYLENOL) 500 MG tablet Take 1,000 mg by mouth every 6 (six) hours  as needed for mild pain or moderate pain.     albuterol (VENTOLIN HFA) 108 (90 Base) MCG/ACT inhaler Inhale 2 puffs into the lungs every 4 (four) hours as needed for wheezing or shortness of breath.     amLODipine (NORVASC) 5 MG tablet Take 5 mg by mouth daily.     aspirin EC 81 MG tablet Take 81 mg by mouth daily.     atorvastatin (LIPITOR) 40 MG tablet Take 40 mg by mouth daily.     cholecalciferol (VITAMIN D) 25 MCG (1000 UNIT) tablet Take 1,000 Units by mouth daily.     clopidogrel (PLAVIX) 75 MG tablet Take 75 mg by mouth daily.     cyclobenzaprine (FLEXERIL) 10 MG tablet Take 10 mg by mouth 4 (four) times daily as needed for muscle spasms.     ferrous  sulfate 325 (65 FE) MG tablet Take 325 mg by mouth daily with breakfast.     fluconazole (DIFLUCAN) 150 MG tablet Take 1 tablet (150 mg total) by mouth every other day. 3 tablet 0   fluticasone (FLONASE) 50 MCG/ACT nasal spray Place 2 sprays into both nostrils daily.     GARLIC PO Take 1 tablet by mouth daily.     glipiZIDE (GLUCOTROL XL) 2.5 MG 24 hr tablet Take 2.5 mg by mouth every morning.     hydrochlorothiazide (HYDRODIURIL) 12.5 MG tablet Take 12.5 mg by mouth daily.     LEVEMIR FLEXTOUCH 100 UNIT/ML Pen Inject 40 Units into the skin at bedtime. X 6 more nights, then return back to 22 units at bedtime (Patient taking differently: Inject 32 Units into the skin every evening.) 15 mL 11   lidocaine (XYLOCAINE) 2 % solution Patient: Mix 1part 2% viscous lidocaine, 1part H20. Swallow 31mL of diluted mixture, 32min before meals and at bedtime, up to QID for soreness. 200 mL 3   lidocaine-prilocaine (EMLA) cream Apply to the Port-A-Cath site 30-60 minutes before treatment. 30 g 0   lisinopril (ZESTRIL) 5 MG tablet Take 5 mg by mouth daily.     LORazepam (ATIVAN) 1 MG tablet Take 1 mg by mouth every 6 (six) hours as needed for anxiety or sleep.  5   nystatin cream (MYCOSTATIN) Apply to affected area 2 times daily 80 g 0   pantoprazole (PROTONIX) 40 MG tablet Take 1 tablet (40 mg total) by mouth 2 (two) times daily before a meal. 60 tablet 3   prochlorperazine (COMPAZINE) 10 MG tablet Take 1 tablet (10 mg total) by mouth every 6 (six) hours as needed for nausea or vomiting. 30 tablet 0   senna (SENOKOT) 8.6 MG TABS tablet Take 1 tablet (8.6 mg total) by mouth at bedtime as needed for mild constipation. May be needed as pain medication can constipate. 120 each 1   Sennosides 25 MG TABS Take 25 mg by mouth daily as needed (severe constipation).     sildenafil (REVATIO) 20 MG tablet Take 60 mg by mouth daily as needed (erectile dysfunction).     sildenafil (VIAGRA) 100 MG tablet Take 100 mg by mouth  daily as needed for erectile dysfunction.     sucralfate (CARAFATE) 1 g tablet Take 1 tablet (1 g total) by mouth 3 (three) times daily as needed. May dissolve 1 tab and glass of water and drink as needed 90 tablet 0   tadalafil (CIALIS) 20 MG tablet Take 1 tablet (20 mg total) by mouth daily as needed for erectile dysfunction. Take 30-60 minutes prior to intercourse 20  tablet 11   testosterone cypionate (DEPOTESTOSTERONE CYPIONATE) 200 MG/ML injection Inject 1.5 mLs into the muscle every 14 (fourteen) days.     Testosterone Cypionate 200 MG/ML SOLN Inject 300 mg as directed every 14 (fourteen) days.     traZODone (DESYREL) 50 MG tablet Take 50 mg by mouth at bedtime.     umeclidinium-vilanterol (ANORO ELLIPTA) 62.5-25 MCG/ACT AEPB Inhale 1 puff into the lungs daily. 1 each 5   No current facility-administered medications for this visit.   Facility-Administered Medications Ordered in Other Visits  Medication Dose Route Frequency Provider Last Rate Last Admin   CARBOplatin (PARAPLATIN) 150 mg in sodium chloride 0.9 % 100 mL chemo infusion  150 mg Intravenous Once Curt Bears, MD       dexamethasone (DECADRON) 10 mg in sodium chloride 0.9 % 50 mL IVPB  10 mg Intravenous Once Curt Bears, MD       heparin lock flush 100 unit/mL  500 Units Intracatheter Once PRN Curt Bears, MD       PACLitaxel (TAXOL) 90 mg in sodium chloride 0.9 % 250 mL chemo infusion (</= 80mg /m2)  45 mg/m2 (Treatment Plan Recorded) Intravenous Once Curt Bears, MD       sodium chloride flush (NS) 0.9 % injection 10 mL  10 mL Intracatheter PRN Curt Bears, MD        SURGICAL HISTORY:  Past Surgical History:  Procedure Laterality Date   BALLOON DILATION  12/31/2021   Procedure: BALLOON DILATION;  Surgeon: [name], MD;  [facility];  Service: Endoscopy;;   BIOPSY  04/30/2022   Procedure: BIOPSY;  Surgeon: Maryjane Hurter, MD;  Location: [facility];  Service: Pulmonary;;   CAROTID  STENT  06/2018   COLONOSCOPY  2003   Dr. [name], polyps   COLONOSCOPY  2005   Dr. [name], multiple diverticula   COLONOSCOPY  2008   Dr. [name], diverticulosis   COLONOSCOPY WITH PROPOFOL N/A 10/21/2016   Dr. [name]: Diverticulosis, two 5-7 mm polyps removed. path-tubular adenomas.  Next colonoscopy in 5 years.   COLONOSCOPY WITH PROPOFOL N/A 12/31/2021   Procedure: COLONOSCOPY WITH PROPOFOL;  Surgeon: [name], MD;  [facility];  Service: Endoscopy;  Laterality: N/A;  8:15am   ENDOBRONCHIAL ULTRASOUND Right 04/30/2022   Procedure: ENDOBRONCHIAL ULTRASOUND;  Surgeon: Maryjane Hurter, MD;  Location: [facility];  Service: Pulmonary;  Laterality: Right;   ESOPHAGOGASTRODUODENOSCOPY     Multiple EGDs. 1999 EGD showed gastric ulcers, no H pylori and benign biopsies performed by Dr. [name]. 2001 gastric ulcer healed. Last EGD 2005 had gastritis.   ESOPHAGOGASTRODUODENOSCOPY (EGD) WITH PROPOFOL N/A 12/31/2021   Procedure: ESOPHAGOGASTRODUODENOSCOPY (EGD) WITH PROPOFOL;  Surgeon: [name], MD;  [facility];  Service: Endoscopy;  Laterality: N/A;   FINE NEEDLE ASPIRATION  04/30/2022   Procedure: FINE NEEDLE ASPIRATION (FNA) LINEAR;  Surgeon: Maryjane Hurter, MD;  [facility];  Service: Pulmonary;;   IR GASTROSTOMY TUBE REMOVAL  02/08/2020   IR IMAGING GUIDED PORT INSERTION  05/26/2022   IR REPLACE G-TUBE SIMPLE WO FLUORO  03/20/2019   IR REPLACE G-TUBE SIMPLE WO FLUORO  09/21/2019   IR REPLC GASTRO/COLONIC TUBE PERCUT W/FLUORO  05/16/2018   Left partial mandibulectomy, Scapular free flap reconstruction, selective neck dissection, tracheotomy, and resection of intraoral palate cancer. Left 03/01/2017   [facility]   LUNG BIOPSY     MOUTH SURGERY     PARTIAL NEPHRECTOMY Left  2012   POLYPECTOMY  10/21/2016   Procedure: POLYPECTOMY;  Surgeon: [name], MD;  [facility];  Service: Endoscopy;;  hepatic  flexure x2   POLYPECTOMY  12/31/2021   Procedure: POLYPECTOMY INTESTINAL;  Surgeon: [name], MD;  [facility];  Service: Endoscopy;;   SURGERY OF LIP  06/2019   lip revision   TRANSURETHRAL RESECTION OF PROSTATE N/A 03/01/2019   Procedure: TRANSURETHRAL RESECTION OF THE PROSTATE (TURP)WITH CYSTOSCOPY;  Surgeon: Raynelle Bring, MD;  Location: [facility];  Service: Urology;  Laterality: N/A;   VIDEO BRONCHOSCOPY Right 04/30/2022   Procedure: VIDEO BRONCHOSCOPY WITHOUT FLUORO;  Surgeon: Maryjane Hurter, MD;  [facility];  Service: Pulmonary;  Laterality: Right;    REVIEW OF SYSTEMS:   Review of Systems  Constitutional: Negative for appetite change, chills, fatigue, fever and unexpected weight change.  HENT:   Negative for mouth sores, nosebleeds, and trouble swallowing.  Sore throat  Eyes: Negative for eye problems and icterus.  Respiratory: Negative for cough, hemoptysis, shortness of breath and wheezing.   Cardiovascular: Negative for chest pain and leg swelling. Chest soreness  Gastrointestinal: Negative for abdominal pain, constipation, diarrhea, and vomiting. Nausea  Genitourinary: Negative for bladder incontinence, difficulty urinating, dysuria, frequency and hematuria.   Musculoskeletal: Negative for back pain, gait problem, neck pain and neck stiffness.  Skin: Negative for itching and rash.  Neurological: Negative for dizziness, extremity weakness, gait problem, light-headedness and seizures. Headaches  Hematological: Negative for adenopathy. Does not bruise/bleed easily.  Psychiatric/Behavioral: Negative for confusion, depression and sleep disturbance. The patient is not nervous/anxious.     PHYSICAL EXAMINATION:  Blood pressure 132/72, pulse 100, temperature (!) 97.5 F (36.4 C), temperature source Oral, resp. rate 16, weight 183 lb 12.8 oz (83.4 kg), SpO2 95 %.  ECOG PERFORMANCE STATUS: 1  Physical Exam  Constitutional: Oriented to person, place, and  time and well-developed, well-nourished, and in no distress. No distress.  HENT:  Head: Normocephalic and atraumatic. Left sided swelling and drooping- s/p radiation changes hx head/neck cancer Mouth/Throat: Oropharynx is clear and moist. No oropharyngeal exudate.  Eyes: Conjunctivae are normal. Right eye exhibits no discharge. Left eye exhibits no discharge. No scleral icterus.  Neck: Normal range of motion. Neck supple.  Cardiovascular: Normal rate, regular rhythm, normal heart sounds and intact distal pulses.   Pulmonary/Chest: Effort normal and breath sounds normal. No respiratory distress. No wheezes. No rales.  Abdominal: Soft. Exhibits no distension and no mass. There is no tenderness.  Musculoskeletal: Normal range of motion. Exhibits no edema.  Lymphadenopathy:    No cervical adenopathy.  Neurological: Alert and oriented to person, place, and time. Exhibits normal muscle tone. Gait normal. Coordination normal.   Skin: Skin is warm and dry. No rash noted. Not diaphoretic. No erythema. No pallor.  Psychiatric: Mood, memory and judgment normal.  Vitals reviewed.  LABORATORY DATA: Lab Results  Component Value Date   WBC 2.4 (L) 06/28/2022   HGB 10.9 (L) 06/28/2022   HCT 32.8 (L) 06/28/2022   MCV 79.6 (L) 06/28/2022   PLT 228 06/28/2022      Chemistry      Component Value Date/Time   NA 131 (L) 06/28/2022 0957   K 5.1 06/28/2022 0957   CL 99 06/28/2022 0957   CO2 27 06/28/2022 0957   BUN 37 (H) 06/28/2022 0957   CREATININE 1.53 (H) 06/28/2022 0957      Component Value Date/Time   CALCIUM 9.2 06/28/2022 0957  ALKPHOS 65 06/28/2022 0957   AST 13 (L) 06/28/2022 0957   ALT 12 06/28/2022 0957   BILITOT 0.3 06/28/2022 0957       RADIOGRAPHIC STUDIES:  No results found.   ASSESSMENT/PLAN:  This is a very pleasant 71 year old African-American male diagnosed with stage IIIc (T4, N3, M0) non-small cell lung cancer, squamous cell carcinoma.  The patient presented with a  large right upper lobe lung mass in addition to mediastinal and bilateral lymphadenopathy.  The patient was diagnosed in September 2023.    The patient is currently undergoing a course of concurrent chemoradiation with weekly carboplatin for an AUC of 2 paclitaxel 45 mg per metered square.  The patient is status post 4 cycles.  The patient's been tolerating it well without any concerning adverse side effects except epigastric discomfort since starting radiation. He is currently prescribed carafate and scheduled to see Dr. Isidore Moos today.  The last day radiation is tentatively scheduled for 07/06/22.   Labs were reviewed. The patient has baseline CKD and sees nephrology. Ok to treat with creatinine of 1.53  today. Recommend that he proceed with cycle #5 today scheduled.  Next week will be his last week of chemotherapy as long as his labs permit.  I will order his next restaging CT scan of the chest without contrast which will be performed approximately 3 weeks after completion of radiation.  We will then see him back for follow-up visit a few days later to review his scan results and have a more detailed discussion about his current condition neck steps.  Constipation currently controlled with medication.     Blood sugar at 202 today. He had his medications adjusted by his PCP due to hyperglycemia.    Previously evaluated by nutrition. He will talk to radiation oncology today about his odynophagia/dysphagia for additional recommendations.    The patient was advised to call immediately if she has any concerning symptoms in the interval. The patient voices understanding of current disease status and treatment options and is in agreement with the current care plan. All questions were answered. The patient knows to call the clinic with any problems, questions or concerns. We can certainly see the patient much sooner if necessary     Orders Placed This Encounter  Procedures   CT Chest W Contrast     Standing Status:   Future    Standing Expiration Date:   06/28/2023    Order Specific Question:   If indicated for the ordered procedure, I authorize the administration of contrast media per Radiology protocol    Answer:   Yes    Order Specific Question:   Preferred imaging location?    Answer:   [facility]      The total time spent in the appointment was 20-29 minutes  [place], PA-C 06/28/22

## 2022-06-28 ENCOUNTER — Other Ambulatory Visit: Payer: Self-pay

## 2022-06-28 ENCOUNTER — Inpatient Hospital Stay: Payer: Medicare Other | Admitting: Nutrition

## 2022-06-28 ENCOUNTER — Ambulatory Visit: Payer: Medicare Other

## 2022-06-28 ENCOUNTER — Inpatient Hospital Stay: Payer: Medicare Other

## 2022-06-28 ENCOUNTER — Inpatient Hospital Stay (HOSPITAL_BASED_OUTPATIENT_CLINIC_OR_DEPARTMENT_OTHER): Payer: Medicare Other | Admitting: Physician Assistant

## 2022-06-28 ENCOUNTER — Ambulatory Visit
Admission: RE | Admit: 2022-06-28 | Discharge: 2022-06-28 | Disposition: A | Discharge status: Home / Self Care | Payer: Medicare Other | Source: Ambulatory Visit | Attending: Radiation Oncology | Admitting: Radiation Oncology

## 2022-06-28 VITALS — BP 137/75 | HR 100 | Temp 98.2°F | Resp 20 | Ht 71.0 in | Wt 184.2 lb

## 2022-06-28 VITALS — BP 132/72 | HR 100 | Temp 97.5°F | Resp 16 | Wt 183.8 lb

## 2022-06-28 DIAGNOSIS — C3411 Malignant neoplasm of upper lobe, right bronchus or lung: Secondary | ICD-10-CM | POA: Insufficient documentation

## 2022-06-28 DIAGNOSIS — Z5111 Encounter for antineoplastic chemotherapy: Secondary | ICD-10-CM | POA: Insufficient documentation

## 2022-06-28 DIAGNOSIS — Z79899 Other long term (current) drug therapy: Secondary | ICD-10-CM | POA: Insufficient documentation

## 2022-06-28 DIAGNOSIS — Z95828 Presence of other vascular implants and grafts: Secondary | ICD-10-CM

## 2022-06-28 LAB — CMP (CANCER CENTER ONLY)
ALT: 12 U/L (ref 0–44)
AST: 13 U/L — ABNORMAL LOW (ref 15–41)
Albumin: 3.5 g/dL (ref 3.5–5.0)
Alkaline Phosphatase: 65 U/L (ref 38–126)
Anion gap: 5 (ref 5–15)
BUN: 37 mg/dL — ABNORMAL HIGH (ref 8–23)
CO2: 27 mmol/L (ref 22–32)
Calcium: 9.2 mg/dL (ref 8.9–10.3)
Chloride: 99 mmol/L (ref 98–111)
Creatinine: 1.53 mg/dL — ABNORMAL HIGH (ref 0.61–1.24)
GFR, Estimated: 48 mL/min — ABNORMAL LOW (ref >60)
Glucose, Bld: 202 mg/dL — ABNORMAL HIGH (ref 70–99)
Potassium: 5.1 mmol/L (ref 3.5–5.1)
Sodium: 131 mmol/L — ABNORMAL LOW (ref 135–145)
Total Bilirubin: 0.3 mg/dL (ref 0.3–1.2)
Total Protein: 6.7 g/dL (ref 6.5–8.1)

## 2022-06-28 LAB — CBC WITH DIFFERENTIAL (CANCER CENTER ONLY)
Abs Immature Granulocytes: 0 10*3/uL (ref 0.00–0.07)
Basophils Absolute: 0 10*3/uL (ref 0.0–0.1)
Basophils Relative: 1 %
Eosinophils Absolute: 0 10*3/uL (ref 0.0–0.5)
Eosinophils Relative: 1 %
HCT: 32.8 % — ABNORMAL LOW (ref 39.0–52.0)
Hemoglobin: 10.9 g/dL — ABNORMAL LOW (ref 13.0–17.0)
Immature Granulocytes: 0 %
Lymphocytes Relative: 3 %
Lymphs Abs: 0.1 10*3/uL — ABNORMAL LOW (ref 0.7–4.0)
MCH: 26.5 pg (ref 26.0–34.0)
MCHC: 33.2 g/dL (ref 30.0–36.0)
MCV: 79.6 fL — ABNORMAL LOW (ref 80.0–100.0)
Monocytes Absolute: 0.2 10*3/uL (ref 0.1–1.0)
Monocytes Relative: 10 %
Neutro Abs: 2.1 10*3/uL (ref 1.7–7.7)
Neutrophils Relative %: 85 %
Platelet Count: 228 10*3/uL (ref 150–400)
RBC: 4.12 MIL/uL — ABNORMAL LOW (ref 4.22–5.81)
RDW: 16.8 % — ABNORMAL HIGH (ref 11.5–15.5)
WBC Count: 2.4 10*3/uL — ABNORMAL LOW (ref 4.0–10.5)
nRBC: 0 % (ref 0.0–0.2)

## 2022-06-28 LAB — RAD ONC ARIA SESSION SUMMARY
Course Elapsed Days: 33
Plan Fractions Treated to Date: 24
Plan Prescribed Dose Per Fraction: 2 Gy
Plan Total Fractions Prescribed: 30
Plan Total Prescribed Dose: 60 Gy
Reference Point Dosage Given to Date: 48 Gy
Reference Point Session Dosage Given: 2 Gy
Session Number: 24

## 2022-06-28 MED ORDER — SODIUM CHLORIDE 0.9% FLUSH
10.0000 mL | INTRAVENOUS | Status: DC | PRN
  Administered 2022-06-28: 10 mL

## 2022-06-28 MED ORDER — SODIUM CHLORIDE 0.9 % IV SOLN: Freq: Once | INTRAVENOUS | Status: AC

## 2022-06-28 MED ORDER — PROCHLORPERAZINE MALEATE 10 MG PO TABS: 10.0000 mg | ORAL_TABLET | Freq: Four times a day (QID) | ORAL | 2 refills | Status: DC | PRN

## 2022-06-28 MED ORDER — SODIUM CHLORIDE 0.9% FLUSH
10.0000 mL | INTRAVENOUS | Status: AC | PRN
  Administered 2022-06-28: 10 mL

## 2022-06-28 MED ORDER — FAMOTIDINE IN NACL 20-0.9 MG/50ML-% IV SOLN
20.0000 mg | Freq: Once | INTRAVENOUS | Status: AC
  Administered 2022-06-28: 20 mg via INTRAVENOUS
  Filled 2022-06-28: qty 50

## 2022-06-28 MED ORDER — PALONOSETRON HCL INJECTION 0.25 MG/5ML
0.2500 mg | Freq: Once | INTRAVENOUS | Status: AC
  Administered 2022-06-28: 0.25 mg via INTRAVENOUS
  Filled 2022-06-28: qty 5

## 2022-06-28 MED ORDER — CETIRIZINE HCL 10 MG/ML IV SOLN
10.0000 mg | Freq: Once | INTRAVENOUS | Status: AC
  Administered 2022-06-28: 10 mg via INTRAVENOUS
  Filled 2022-06-28: qty 1

## 2022-06-28 MED ORDER — SODIUM CHLORIDE 0.9 % IV SOLN
10.0000 mg | Freq: Once | INTRAVENOUS | Status: AC
  Administered 2022-06-28: 10 mg via INTRAVENOUS
  Filled 2022-06-28: qty 10

## 2022-06-28 MED ORDER — SODIUM CHLORIDE 0.9 % IV SOLN
45.0000 mg/m2 | Freq: Once | INTRAVENOUS | Status: AC
  Administered 2022-06-28: 90 mg via INTRAVENOUS
  Filled 2022-06-28: qty 15

## 2022-06-28 MED ORDER — SODIUM CHLORIDE 0.9 % IV SOLN
146.8000 mg | Freq: Once | INTRAVENOUS | Status: AC
  Administered 2022-06-28: 150 mg via INTRAVENOUS
  Filled 2022-06-28: qty 15

## 2022-06-28 MED ORDER — HEPARIN SOD (PORK) LOCK FLUSH 100 UNIT/ML IV SOLN
500.0000 [IU] | Freq: Once | INTRAVENOUS | Status: AC | PRN
  Administered 2022-06-28: 500 [IU]

## 2022-06-28 NOTE — Progress Notes (Signed)
Nutrition follow-up completed with patient during infusion for non-small cell lung cancer.  Weight improved and documented as 183 pounds 12.8 ounces November 6.  Noted labs: Sodium 131, glucose 202, BUN 37, and creatinine 1.53.  Patient reports he eats most foods without difficulty.  He continues to have some difficulty swallowing.  His appetite is improved and he denies nausea, vomiting, constipation, and diarrhea.  He continues to be concerned with the amount of sugar in some oral nutrition supplements.  Reports he has been buying Glucerna.  Nutrition diagnosis: Unintended weight loss improved.  Intervention: Educated patient to continue strategies for adequate calorie and protein intake.  Patient can continue Glucerna at home as desired.  I provided some bottled samples for him but I do not have full cases of this product to give to him.  Monitoring, evaluation, goals: Patient will tolerate adequate calories and protein to minimize weight loss.  Next visit: We will monitor for follow-up needs.  **Disclaimer: This note was dictated with voice recognition software. Similar sounding words can inadvertently be transcribed and this note may contain transcription errors which may not have been corrected upon publication of note.**

## 2022-06-28 NOTE — Progress Notes (Signed)
Per Cassie Heilingoetter, PA ok for treatment today with elevated serum creatine 1.53 mg/dL

## 2022-06-28 NOTE — Patient Instructions (Signed)
[facility]  Discharge Instructions: Thank you for choosing [facility] to provide your oncology and hematology care.   If you have a lab appointment with the [facility], please go directly to the [facility] and check in at the registration area.   Wear comfortable clothing and clothing appropriate for easy access to any Portacath or PICC line.   We strive to give you quality time with your provider. You may need to reschedule your appointment if you arrive late (15 or more minutes).  Arriving late affects you and other patients whose appointments are after yours.  Also, if you miss three or more appointments without notifying the office, you may be dismissed from the clinic at the provider's discretion.      For prescription refill requests, have your pharmacy contact our office and allow 72 hours for refills to be completed.    Today you received the following chemotherapy and/or immunotherapy agents: Paclitaxel (Taxol) and Carboplatin.    To help prevent nausea and vomiting after your treatment, we encourage you to take your nausea medication as directed.  BELOW ARE SYMPTOMS THAT SHOULD BE REPORTED IMMEDIATELY: *FEVER GREATER THAN 100.4 F (38 C) OR HIGHER *CHILLS OR SWEATING *NAUSEA AND VOMITING THAT IS NOT CONTROLLED WITH YOUR NAUSEA MEDICATION *UNUSUAL SHORTNESS OF BREATH *UNUSUAL BRUISING OR BLEEDING *URINARY PROBLEMS (pain or burning when urinating, or frequent urination) *BOWEL PROBLEMS (unusual diarrhea, constipation, pain near the anus) TENDERNESS IN MOUTH AND THROAT WITH OR WITHOUT PRESENCE OF ULCERS (sore throat, sores in mouth, or a toothache) UNUSUAL RASH, SWELLING OR PAIN  UNUSUAL VAGINAL DISCHARGE OR ITCHING   Items with * indicate a potential emergency and should be followed up as soon as possible or go to the Emergency Department if any problems should occur.  Please show the CHEMOTHERAPY ALERT CARD or IMMUNOTHERAPY  ALERT CARD at check-in to the Emergency Department and triage nurse.  Should you have questions after your visit or need to cancel or reschedule your appointment, please contact [facility]  Dept: [phone]  and follow the prompts.  Office hours are 8:00 a.m. to 4:30 p.m. Monday - Friday. Please note that voicemails left after 4:00 p.m. may not be returned until the following business day.  We are closed weekends and major holidays. You have access to a nurse at all times for urgent questions. Please call the main number to the clinic Dept: [phone] and follow the prompts.   For any non-urgent questions, you may also contact your provider using MyChart. We now offer e-Visits for anyone 33 and older to request care online for non-urgent symptoms. For details visit [email or web address].   Also download the MyChart app! Go to the app store, search "MyChart", open the app, select [facility], and log in with your MyChart username and password.  Masks are optional in the cancer centers. If you would like for your care team to wear a mask while they are taking care of you, please let them know. You may have one support person who is at least 72 years old accompany you for your appointments. Paclitaxel Injection What is this medication? PACLITAXEL (PAK li TAX el) treats some types of cancer. It works by slowing down the growth of cancer cells. This medicine may be used for other purposes; ask your health care provider or pharmacist if you have questions. COMMON BRAND NAME(S): Onxol, Taxol What should I tell my care team before I take this  medication? They need to know if you have any of these conditions: Heart disease Liver disease Low white blood cell levels An unusual or allergic reaction to paclitaxel, other medications, foods, dyes, or preservatives If you or your partner are pregnant or trying to get pregnant Breast-feeding How should I use this  medication? This medication is injected into a vein. It is given by your care team in a hospital or clinic setting. Talk to your care team about the use of this medication in children. While it may be given to children for selected conditions, precautions do apply. Overdosage: If you think you have taken too much of this medicine contact a poison control center or emergency room at once. NOTE: This medicine is only for you. Do not share this medicine with others. What if I miss a dose? Keep appointments for follow-up doses. It is important not to miss your dose. Call your care team if you are unable to keep an appointment. What may interact with this medication? Do not take this medication with any of the following: Live virus vaccines Other medications may affect the way this medication works. Talk with your care team about all of the medications you take. They may suggest changes to your treatment plan to lower the risk of side effects and to make sure your medications work as intended. This list may not describe all possible interactions. Give your health care provider a list of all the medicines, herbs, non-prescription drugs, or dietary supplements you use. Also tell them if you smoke, drink alcohol, or use illegal drugs. Some items may interact with your medicine. What should I watch for while using this medication? Your condition will be monitored carefully while you are receiving this medication. You may need blood work while taking this medication. This medication may make you feel generally unwell. This is not uncommon as chemotherapy can affect healthy cells as well as cancer cells. Report any side effects. Continue your course of treatment even though you feel ill unless your care team tells you to stop. This medication can cause serious allergic reactions. To reduce the risk, your care team may give you other medications to take before receiving this one. Be sure to follow the directions  from your care team. This medication may increase your risk of getting an infection. Call your care team for advice if you get a fever, chills, sore throat, or other symptoms of a cold or flu. Do not treat yourself. Try to avoid being around people who are sick. This medication may increase your risk to bruise or bleed. Call your care team if you notice any unusual bleeding. Be careful brushing or flossing your teeth or using a toothpick because you may get an infection or bleed more easily. If you have any dental work done, tell your dentist you are receiving this medication. Talk to your care team if you may be pregnant. Serious birth defects can occur if you take this medication during pregnancy. Talk to your care team before breastfeeding. Changes to your treatment plan may be needed. What side effects may I notice from receiving this medication? Side effects that you should report to your care team as soon as possible: Allergic reactions--skin rash, itching, hives, swelling of the face, lips, tongue, or throat Heart rhythm changes--fast or irregular heartbeat, dizziness, feeling faint or lightheaded, chest pain, trouble breathing Increase in blood pressure Infection--fever, chills, cough, sore throat, wounds that don't heal, pain or trouble when passing urine, general feeling of  discomfort or being unwell Low blood pressure--dizziness, feeling faint or lightheaded, blurry vision Low red blood cell level--unusual weakness or fatigue, dizziness, headache, trouble breathing Painful swelling, warmth, or redness of the skin, blisters or sores at the infusion site Pain, tingling, or numbness in the hands or feet Slow heartbeat--dizziness, feeling faint or lightheaded, confusion, trouble breathing, unusual weakness or fatigue Unusual bruising or bleeding Side effects that usually do not require medical attention (report to your care team if they continue or are bothersome): Diarrhea Hair  loss Joint pain Loss of appetite Muscle pain Nausea Vomiting This list may not describe all possible side effects. Call your doctor for medical advice about side effects. You may report side effects to FDA at 1-800-FDA-1088. Where should I keep my medication? This medication is given in a hospital or clinic. It will not be stored at home. NOTE: This sheet is a summary. It may not cover all possible information. If you have questions about this medicine, talk to your doctor, pharmacist, or health care provider.  2023 Elsevier/Gold Standard (2021-12-09 00:00:00) Carboplatin Injection What is this medication? CARBOPLATIN (KAR boe pla tin) treats some types of cancer. It works by slowing down the growth of cancer cells. This medicine may be used for other purposes; ask your health care provider or pharmacist if you have questions. COMMON BRAND NAME(S): Paraplatin What should I tell my care team before I take this medication? They need to know if you have any of these conditions: Blood disorders Hearing problems Kidney disease Recent or ongoing radiation therapy An unusual or allergic reaction to carboplatin, cisplatin, other medications, foods, dyes, or preservatives Pregnant or trying to get pregnant Breast-feeding How should I use this medication? This medication is injected into a vein. It is given by your care team in a hospital or clinic setting. Talk to your care team about the use of this medication in children. Special care may be needed. Overdosage: If you think you have taken too much of this medicine contact a poison control center or emergency room at once. NOTE: This medicine is only for you. Do not share this medicine with others. What if I miss a dose? Keep appointments for follow-up doses. It is important not to miss your dose. Call your care team if you are unable to keep an appointment. What may interact with this medication? Medications for seizures Some antibiotics,  such as amikacin, gentamicin, neomycin, streptomycin, tobramycin Vaccines This list may not describe all possible interactions. Give your health care provider a list of all the medicines, herbs, non-prescription drugs, or dietary supplements you use. Also tell them if you smoke, drink alcohol, or use illegal drugs. Some items may interact with your medicine. What should I watch for while using this medication? Your condition will be monitored carefully while you are receiving this medication. You may need blood work while taking this medication. This medication may make you feel generally unwell. This is not uncommon, as chemotherapy can affect healthy cells as well as cancer cells. Report any side effects. Continue your course of treatment even though you feel ill unless your care team tells you to stop. In some cases, you may be given additional medications to help with side effects. Follow all directions for their use. This medication may increase your risk of getting an infection. Call your care team for advice if you get a fever, chills, sore throat, or other symptoms of a cold or flu. Do not treat yourself. Try to avoid being around  people who are sick. Avoid taking medications that contain aspirin, acetaminophen, ibuprofen, naproxen, or ketoprofen unless instructed by your care team. These medications may hide a fever. Be careful brushing or flossing your teeth or using a toothpick because you may get an infection or bleed more easily. If you have any dental work done, tell your dentist you are receiving this medication. Talk to your care team if you wish to become pregnant or think you might be pregnant. This medication can cause serious birth defects. Talk to your care team about effective forms of contraception. Do not breast-feed while taking this medication. What side effects may I notice from receiving this medication? Side effects that you should report to your care team as soon as  possible: Allergic reactions--skin rash, itching, hives, swelling of the face, lips, tongue, or throat Infection--fever, chills, cough, sore throat, wounds that don't heal, pain or trouble when passing urine, general feeling of discomfort or being unwell Low red blood cell level--unusual weakness or fatigue, dizziness, headache, trouble breathing Pain, tingling, or numbness in the hands or feet, muscle weakness, change in vision, confusion or trouble speaking, loss of balance or coordination, trouble walking, seizures Unusual bruising or bleeding Side effects that usually do not require medical attention (report to your care team if they continue or are bothersome): Hair loss Nausea Unusual weakness or fatigue Vomiting This list may not describe all possible side effects. Call your doctor for medical advice about side effects. You may report side effects to FDA at 1-800-FDA-1088. Where should I keep my medication? This medication is given in a hospital or clinic. It will not be stored at home. NOTE: This sheet is a summary. It may not cover all possible information. If you have questions about this medicine, talk to your doctor, pharmacist, or health care provider.  2023 Elsevier/Gold Standard (2021-11-23 00:00:00)

## 2022-06-29 ENCOUNTER — Other Ambulatory Visit: Payer: Self-pay

## 2022-06-29 ENCOUNTER — Ambulatory Visit
Admission: RE | Admit: 2022-06-29 | Discharge: 2022-06-29 | Disposition: A | Discharge status: Home / Self Care | Payer: Medicare Other | Source: Ambulatory Visit | Attending: Radiation Oncology | Admitting: Radiation Oncology

## 2022-06-29 DIAGNOSIS — C3411 Malignant neoplasm of upper lobe, right bronchus or lung: Secondary | ICD-10-CM | POA: Diagnosis not present

## 2022-06-29 LAB — RAD ONC ARIA SESSION SUMMARY
Course Elapsed Days: 34
Plan Fractions Treated to Date: 25
Plan Prescribed Dose Per Fraction: 2 Gy
Plan Total Fractions Prescribed: 30
Plan Total Prescribed Dose: 60 Gy
Reference Point Dosage Given to Date: 50 Gy
Reference Point Session Dosage Given: 2 Gy
Session Number: 25

## 2022-06-30 ENCOUNTER — Ambulatory Visit
Admission: RE | Admit: 2022-06-30 | Discharge: 2022-06-30 | Disposition: A | Discharge status: Home / Self Care | Payer: Medicare Other | Source: Ambulatory Visit | Attending: Radiation Oncology | Admitting: Radiation Oncology

## 2022-06-30 ENCOUNTER — Other Ambulatory Visit: Payer: Self-pay

## 2022-06-30 DIAGNOSIS — C3411 Malignant neoplasm of upper lobe, right bronchus or lung: Secondary | ICD-10-CM | POA: Diagnosis not present

## 2022-06-30 LAB — RAD ONC ARIA SESSION SUMMARY
Course Elapsed Days: 35
Plan Fractions Treated to Date: 26
Plan Prescribed Dose Per Fraction: 2 Gy
Plan Total Fractions Prescribed: 30
Plan Total Prescribed Dose: 60 Gy
Reference Point Dosage Given to Date: 52 Gy
Reference Point Session Dosage Given: 2 Gy
Session Number: 26

## 2022-07-01 ENCOUNTER — Ambulatory Visit
Admission: RE | Admit: 2022-07-01 | Discharge: 2022-07-01 | Disposition: A | Discharge status: Home / Self Care | Payer: Medicare Other | Source: Ambulatory Visit | Attending: Radiation Oncology | Admitting: Radiation Oncology

## 2022-07-01 ENCOUNTER — Other Ambulatory Visit: Payer: Self-pay

## 2022-07-01 DIAGNOSIS — C3411 Malignant neoplasm of upper lobe, right bronchus or lung: Secondary | ICD-10-CM | POA: Diagnosis not present

## 2022-07-01 LAB — RAD ONC ARIA SESSION SUMMARY
Course Elapsed Days: 36
Plan Fractions Treated to Date: 27
Plan Prescribed Dose Per Fraction: 2 Gy
Plan Total Fractions Prescribed: 30
Plan Total Prescribed Dose: 60 Gy
Reference Point Dosage Given to Date: 54 Gy
Reference Point Session Dosage Given: 2 Gy
Session Number: 27

## 2022-07-02 ENCOUNTER — Other Ambulatory Visit: Payer: Self-pay

## 2022-07-02 ENCOUNTER — Ambulatory Visit
Admission: RE | Admit: 2022-07-02 | Discharge: 2022-07-02 | Disposition: A | Discharge status: Home / Self Care | Payer: Medicare Other | Source: Ambulatory Visit | Attending: Radiation Oncology | Admitting: Radiation Oncology

## 2022-07-02 DIAGNOSIS — C3411 Malignant neoplasm of upper lobe, right bronchus or lung: Secondary | ICD-10-CM | POA: Diagnosis not present

## 2022-07-02 LAB — RAD ONC ARIA SESSION SUMMARY
Course Elapsed Days: 37
Plan Fractions Treated to Date: 28
Plan Prescribed Dose Per Fraction: 2 Gy
Plan Total Fractions Prescribed: 30
Plan Total Prescribed Dose: 60 Gy
Reference Point Dosage Given to Date: 56 Gy
Reference Point Session Dosage Given: 2 Gy
Session Number: 28

## 2022-07-02 MED FILL — Dexamethasone Sodium Phosphate Inj 100 MG/10ML: INTRAMUSCULAR | Qty: 1 | Status: AC

## 2022-07-05 ENCOUNTER — Inpatient Hospital Stay: Payer: Medicare Other

## 2022-07-05 ENCOUNTER — Ambulatory Visit
Admission: RE | Admit: 2022-07-05 | Discharge: 2022-07-05 | Disposition: A | Discharge status: Home / Self Care | Payer: Medicare Other | Source: Ambulatory Visit | Attending: Radiation Oncology | Admitting: Radiation Oncology

## 2022-07-05 ENCOUNTER — Other Ambulatory Visit: Payer: Self-pay

## 2022-07-05 ENCOUNTER — Ambulatory Visit: Payer: Medicare Other

## 2022-07-05 VITALS — BP 152/75 | HR 92 | Temp 97.7°F | Resp 20 | Wt 183.0 lb

## 2022-07-05 DIAGNOSIS — Z95828 Presence of other vascular implants and grafts: Secondary | ICD-10-CM

## 2022-07-05 DIAGNOSIS — C3411 Malignant neoplasm of upper lobe, right bronchus or lung: Secondary | ICD-10-CM

## 2022-07-05 LAB — RAD ONC ARIA SESSION SUMMARY
Course Elapsed Days: 40
Plan Fractions Treated to Date: 29
Plan Prescribed Dose Per Fraction: 2 Gy
Plan Total Fractions Prescribed: 30
Plan Total Prescribed Dose: 60 Gy
Reference Point Dosage Given to Date: 58 Gy
Reference Point Session Dosage Given: 2 Gy
Session Number: 29

## 2022-07-05 LAB — CBC WITH DIFFERENTIAL (CANCER CENTER ONLY)
Abs Immature Granulocytes: 0.01 10*3/uL (ref 0.00–0.07)
Basophils Absolute: 0 10*3/uL (ref 0.0–0.1)
Basophils Relative: 1 %
Eosinophils Absolute: 0 10*3/uL (ref 0.0–0.5)
Eosinophils Relative: 2 %
HCT: 33 % — ABNORMAL LOW (ref 39.0–52.0)
Hemoglobin: 10.6 g/dL — ABNORMAL LOW (ref 13.0–17.0)
Immature Granulocytes: 1 %
Lymphocytes Relative: 2 %
Lymphs Abs: 0.1 10*3/uL — ABNORMAL LOW (ref 0.7–4.0)
MCH: 25.6 pg — ABNORMAL LOW (ref 26.0–34.0)
MCHC: 32.1 g/dL (ref 30.0–36.0)
MCV: 79.7 fL — ABNORMAL LOW (ref 80.0–100.0)
Monocytes Absolute: 0.1 10*3/uL (ref 0.1–1.0)
Monocytes Relative: 7 %
Neutro Abs: 1.8 10*3/uL (ref 1.7–7.7)
Neutrophils Relative %: 87 %
Platelet Count: 169 10*3/uL (ref 150–400)
RBC: 4.14 MIL/uL — ABNORMAL LOW (ref 4.22–5.81)
RDW: 17.1 % — ABNORMAL HIGH (ref 11.5–15.5)
WBC Count: 2.1 10*3/uL — ABNORMAL LOW (ref 4.0–10.5)
nRBC: 0 % (ref 0.0–0.2)

## 2022-07-05 LAB — CMP (CANCER CENTER ONLY)
ALT: 13 U/L (ref 0–44)
AST: 16 U/L (ref 15–41)
Albumin: 3.7 g/dL (ref 3.5–5.0)
Alkaline Phosphatase: 58 U/L (ref 38–126)
Anion gap: 4 — ABNORMAL LOW (ref 5–15)
BUN: 62 mg/dL — ABNORMAL HIGH (ref 8–23)
CO2: 28 mmol/L (ref 22–32)
Calcium: 9.1 mg/dL (ref 8.9–10.3)
Chloride: 96 mmol/L — ABNORMAL LOW (ref 98–111)
Creatinine: 2.28 mg/dL — ABNORMAL HIGH (ref 0.61–1.24)
GFR, Estimated: 30 mL/min — ABNORMAL LOW (ref >60)
Glucose, Bld: 261 mg/dL — ABNORMAL HIGH (ref 70–99)
Potassium: 5.4 mmol/L — ABNORMAL HIGH (ref 3.5–5.1)
Sodium: 128 mmol/L — ABNORMAL LOW (ref 135–145)
Total Bilirubin: 0.3 mg/dL (ref 0.3–1.2)
Total Protein: 7.2 g/dL (ref 6.5–8.1)

## 2022-07-05 MED ORDER — CETIRIZINE HCL 10 MG/ML IV SOLN
10.0000 mg | Freq: Once | INTRAVENOUS | Status: AC
  Administered 2022-07-05: 10 mg via INTRAVENOUS
  Filled 2022-07-05: qty 1

## 2022-07-05 MED ORDER — SODIUM CHLORIDE 0.9 % IV SOLN
10.0000 mg | Freq: Once | INTRAVENOUS | Status: AC
  Administered 2022-07-05: 10 mg via INTRAVENOUS
  Filled 2022-07-05: qty 10

## 2022-07-05 MED ORDER — SODIUM CHLORIDE 0.9 % IV SOLN
45.0000 mg/m2 | Freq: Once | INTRAVENOUS | Status: AC
  Administered 2022-07-05: 90 mg via INTRAVENOUS
  Filled 2022-07-05: qty 15

## 2022-07-05 MED ORDER — HEPARIN SOD (PORK) LOCK FLUSH 100 UNIT/ML IV SOLN
500.0000 [IU] | Freq: Once | INTRAVENOUS | Status: AC | PRN
  Administered 2022-07-05: 500 [IU]

## 2022-07-05 MED ORDER — FAMOTIDINE IN NACL 20-0.9 MG/50ML-% IV SOLN
20.0000 mg | Freq: Once | INTRAVENOUS | Status: AC
  Administered 2022-07-05: 20 mg via INTRAVENOUS
  Filled 2022-07-05: qty 50

## 2022-07-05 MED ORDER — SODIUM CHLORIDE 0.9 % IV SOLN: Freq: Once | INTRAVENOUS | Status: DC

## 2022-07-05 MED ORDER — PALONOSETRON HCL INJECTION 0.25 MG/5ML
0.2500 mg | Freq: Once | INTRAVENOUS | Status: AC
  Administered 2022-07-05: 0.25 mg via INTRAVENOUS
  Filled 2022-07-05: qty 5

## 2022-07-05 MED ORDER — SONAFINE EX EMUL
1.0000 | Freq: Two times a day (BID) | CUTANEOUS | Status: DC
  Administered 2022-07-05: 1 via TOPICAL

## 2022-07-05 MED ORDER — SODIUM CHLORIDE 0.9% FLUSH
10.0000 mL | INTRAVENOUS | Status: DC | PRN
  Administered 2022-07-05: 10 mL

## 2022-07-05 MED ORDER — SODIUM CHLORIDE 0.9 % IV SOLN: INTRAVENOUS | Status: DC

## 2022-07-05 MED ORDER — SODIUM CHLORIDE 0.9 % IV SOLN
146.8000 mg | Freq: Once | INTRAVENOUS | Status: AC
  Administered 2022-07-05: 150 mg via INTRAVENOUS
  Filled 2022-07-05: qty 15

## 2022-07-05 MED ORDER — SODIUM CHLORIDE 0.9% FLUSH
10.0000 mL | INTRAVENOUS | Status: AC | PRN
  Administered 2022-07-05: 10 mL

## 2022-07-05 NOTE — Progress Notes (Signed)
Ok to treat today per Provider with elevated Scr. Wants 1L of NS run with treatment today.

## 2022-07-05 NOTE — Progress Notes (Signed)
Spoke w/ Dr. Julien Nordmann and he would like to continue 150 mg dose of Carboplatin for now.  Larene Beach, PharmD

## 2022-07-05 NOTE — Patient Instructions (Signed)
[facility]  Discharge Instructions: Thank you for choosing [facility] to provide your oncology and hematology care.   If you have a lab appointment with the [facility], please go directly to the [facility] and check in at the registration area.   Wear comfortable clothing and clothing appropriate for easy access to any Portacath or PICC line.   We strive to give you quality time with your provider. You may need to reschedule your appointment if you arrive late (15 or more minutes).  Arriving late affects you and other patients whose appointments are after yours.  Also, if you miss three or more appointments without notifying the office, you may be dismissed from the clinic at the provider's discretion.      For prescription refill requests, have your pharmacy contact our office and allow 72 hours for refills to be completed.    Today you received the following chemotherapy and/or immunotherapy agents: Paclitaxel, Carboplatin.       To help prevent nausea and vomiting after your treatment, we encourage you to take your nausea medication as directed.  BELOW ARE SYMPTOMS THAT SHOULD BE REPORTED IMMEDIATELY: *FEVER GREATER THAN 100.4 F (38 C) OR HIGHER *CHILLS OR SWEATING *NAUSEA AND VOMITING THAT IS NOT CONTROLLED WITH YOUR NAUSEA MEDICATION *UNUSUAL SHORTNESS OF BREATH *UNUSUAL BRUISING OR BLEEDING *URINARY PROBLEMS (pain or burning when urinating, or frequent urination) *BOWEL PROBLEMS (unusual diarrhea, constipation, pain near the anus) TENDERNESS IN MOUTH AND THROAT WITH OR WITHOUT PRESENCE OF ULCERS (sore throat, sores in mouth, or a toothache) UNUSUAL RASH, SWELLING OR PAIN  UNUSUAL VAGINAL DISCHARGE OR ITCHING   Items with * indicate a potential emergency and should be followed up as soon as possible or go to the Emergency Department if any problems should occur.  Please show the CHEMOTHERAPY ALERT CARD or IMMUNOTHERAPY ALERT CARD  at check-in to the Emergency Department and triage nurse.  Should you have questions after your visit or need to cancel or reschedule your appointment, please contact [facility]  Dept: [phone]  and follow the prompts.  Office hours are 8:00 a.m. to 4:30 p.m. Monday - Friday. Please note that voicemails left after 4:00 p.m. may not be returned until the following business day.  We are closed weekends and major holidays. You have access to a nurse at all times for urgent questions. Please call the main number to the clinic Dept: [phone] and follow the prompts.   For any non-urgent questions, you may also contact your provider using MyChart. We now offer e-Visits for anyone 5 and older to request care online for non-urgent symptoms. For details visit [email or web address].   Also download the MyChart app! Go to the app store, search "MyChart", open the app, select [facility], and log in with your MyChart username and password.  Masks are optional in the cancer centers. If you would like for your care team to wear a mask while they are taking care of you, please let them know. You may have one support person who is at least 72 years old accompany you for your appointments.

## 2022-07-06 ENCOUNTER — Ambulatory Visit
Admission: RE | Admit: 2022-07-06 | Discharge: 2022-07-06 | Disposition: A | Discharge status: Home / Self Care | Payer: Medicare Other | Source: Ambulatory Visit | Attending: Radiation Oncology | Admitting: Radiation Oncology

## 2022-07-06 ENCOUNTER — Other Ambulatory Visit: Payer: Self-pay

## 2022-07-06 ENCOUNTER — Encounter: Payer: Self-pay | Admitting: Radiation Oncology

## 2022-07-06 DIAGNOSIS — C3411 Malignant neoplasm of upper lobe, right bronchus or lung: Secondary | ICD-10-CM | POA: Diagnosis not present

## 2022-07-06 LAB — RAD ONC ARIA SESSION SUMMARY
Course Elapsed Days: 41
Plan Fractions Treated to Date: 30
Plan Prescribed Dose Per Fraction: 2 Gy
Plan Total Fractions Prescribed: 30
Plan Total Prescribed Dose: 60 Gy
Reference Point Dosage Given to Date: 60 Gy
Reference Point Session Dosage Given: 2 Gy
Session Number: 30

## 2022-07-10 ENCOUNTER — Emergency Department (HOSPITAL_COMMUNITY): Payer: Medicare Other

## 2022-07-10 ENCOUNTER — Encounter (HOSPITAL_COMMUNITY): Payer: Self-pay

## 2022-07-10 ENCOUNTER — Other Ambulatory Visit: Payer: Self-pay

## 2022-07-10 ENCOUNTER — Emergency Department (HOSPITAL_COMMUNITY)
Admission: EM | Admit: 2022-07-10 | Discharge: 2022-07-11 | Disposition: A | Discharge status: Home / Self Care | Payer: Medicare Other | Source: Home / Self Care | Attending: Emergency Medicine | Admitting: Emergency Medicine

## 2022-07-10 DIAGNOSIS — D649 Anemia, unspecified: Secondary | ICD-10-CM | POA: Insufficient documentation

## 2022-07-10 DIAGNOSIS — E291 Testicular hypofunction: Secondary | ICD-10-CM | POA: Diagnosis not present

## 2022-07-10 DIAGNOSIS — R824 Acetonuria: Secondary | ICD-10-CM | POA: Diagnosis not present

## 2022-07-10 DIAGNOSIS — Z79899 Other long term (current) drug therapy: Secondary | ICD-10-CM | POA: Insufficient documentation

## 2022-07-10 DIAGNOSIS — Z905 Acquired absence of kidney: Secondary | ICD-10-CM | POA: Diagnosis not present

## 2022-07-10 DIAGNOSIS — N179 Acute kidney failure, unspecified: Secondary | ICD-10-CM | POA: Diagnosis not present

## 2022-07-10 DIAGNOSIS — K861 Other chronic pancreatitis: Secondary | ICD-10-CM | POA: Diagnosis not present

## 2022-07-10 DIAGNOSIS — R42 Dizziness and giddiness: Secondary | ICD-10-CM | POA: Insufficient documentation

## 2022-07-10 DIAGNOSIS — D630 Anemia in neoplastic disease: Secondary | ICD-10-CM | POA: Diagnosis not present

## 2022-07-10 DIAGNOSIS — I2510 Atherosclerotic heart disease of native coronary artery without angina pectoris: Secondary | ICD-10-CM | POA: Insufficient documentation

## 2022-07-10 DIAGNOSIS — Z923 Personal history of irradiation: Secondary | ICD-10-CM | POA: Diagnosis not present

## 2022-07-10 DIAGNOSIS — Z20822 Contact with and (suspected) exposure to covid-19: Secondary | ICD-10-CM | POA: Insufficient documentation

## 2022-07-10 DIAGNOSIS — E875 Hyperkalemia: Secondary | ICD-10-CM | POA: Diagnosis not present

## 2022-07-10 DIAGNOSIS — K209 Esophagitis, unspecified without bleeding: Secondary | ICD-10-CM

## 2022-07-10 DIAGNOSIS — R569 Unspecified convulsions: Secondary | ICD-10-CM | POA: Diagnosis present

## 2022-07-10 DIAGNOSIS — I10 Essential (primary) hypertension: Secondary | ICD-10-CM | POA: Insufficient documentation

## 2022-07-10 DIAGNOSIS — E119 Type 2 diabetes mellitus without complications: Secondary | ICD-10-CM | POA: Insufficient documentation

## 2022-07-10 DIAGNOSIS — E11649 Type 2 diabetes mellitus with hypoglycemia without coma: Secondary | ICD-10-CM | POA: Diagnosis not present

## 2022-07-10 DIAGNOSIS — Z85818 Personal history of malignant neoplasm of other sites of lip, oral cavity, and pharynx: Secondary | ICD-10-CM | POA: Diagnosis not present

## 2022-07-10 DIAGNOSIS — Z7982 Long term (current) use of aspirin: Secondary | ICD-10-CM | POA: Insufficient documentation

## 2022-07-10 DIAGNOSIS — J449 Chronic obstructive pulmonary disease, unspecified: Secondary | ICD-10-CM | POA: Diagnosis not present

## 2022-07-10 DIAGNOSIS — G8929 Other chronic pain: Secondary | ICD-10-CM | POA: Diagnosis not present

## 2022-07-10 DIAGNOSIS — E441 Mild protein-calorie malnutrition: Secondary | ICD-10-CM | POA: Diagnosis not present

## 2022-07-10 DIAGNOSIS — J029 Acute pharyngitis, unspecified: Secondary | ICD-10-CM | POA: Insufficient documentation

## 2022-07-10 DIAGNOSIS — Z6825 Body mass index (BMI) 25.0-25.9, adult: Secondary | ICD-10-CM | POA: Diagnosis not present

## 2022-07-10 DIAGNOSIS — Z794 Long term (current) use of insulin: Secondary | ICD-10-CM | POA: Diagnosis not present

## 2022-07-10 DIAGNOSIS — E785 Hyperlipidemia, unspecified: Secondary | ICD-10-CM | POA: Diagnosis not present

## 2022-07-10 DIAGNOSIS — D72819 Decreased white blood cell count, unspecified: Secondary | ICD-10-CM | POA: Diagnosis not present

## 2022-07-10 DIAGNOSIS — C3411 Malignant neoplasm of upper lobe, right bronchus or lung: Secondary | ICD-10-CM | POA: Diagnosis not present

## 2022-07-10 DIAGNOSIS — Z87891 Personal history of nicotine dependence: Secondary | ICD-10-CM | POA: Diagnosis not present

## 2022-07-10 DIAGNOSIS — R81 Glycosuria: Secondary | ICD-10-CM | POA: Diagnosis not present

## 2022-07-10 LAB — CBC WITH DIFFERENTIAL/PLATELET
Abs Immature Granulocytes: 0.01 10*3/uL (ref 0.00–0.07)
Basophils Absolute: 0 10*3/uL (ref 0.0–0.1)
Basophils Relative: 1 %
Eosinophils Absolute: 0 10*3/uL (ref 0.0–0.5)
Eosinophils Relative: 1 %
HCT: 34.5 % — ABNORMAL LOW (ref 39.0–52.0)
Hemoglobin: 11 g/dL — ABNORMAL LOW (ref 13.0–17.0)
Immature Granulocytes: 1 %
Lymphocytes Relative: 7 %
Lymphs Abs: 0.1 10*3/uL — ABNORMAL LOW (ref 0.7–4.0)
MCH: 25.8 pg — ABNORMAL LOW (ref 26.0–34.0)
MCHC: 31.9 g/dL (ref 30.0–36.0)
MCV: 81 fL (ref 80.0–100.0)
Monocytes Absolute: 0.2 10*3/uL (ref 0.1–1.0)
Monocytes Relative: 12 %
Neutro Abs: 1.1 10*3/uL — ABNORMAL LOW (ref 1.7–7.7)
Neutrophils Relative %: 78 %
Platelets: 153 10*3/uL (ref 150–400)
RBC: 4.26 MIL/uL (ref 4.22–5.81)
RDW: 17.2 % — ABNORMAL HIGH (ref 11.5–15.5)
WBC: 1.4 10*3/uL — CL (ref 4.0–10.5)
nRBC: 0 % (ref 0.0–0.2)

## 2022-07-10 LAB — COMPREHENSIVE METABOLIC PANEL
ALT: 17 U/L (ref 0–44)
AST: 17 U/L (ref 15–41)
Albumin: 3.3 g/dL — ABNORMAL LOW (ref 3.5–5.0)
Alkaline Phosphatase: 72 U/L (ref 38–126)
Anion gap: 9 (ref 5–15)
BUN: 42 mg/dL — ABNORMAL HIGH (ref 8–23)
CO2: 26 mmol/L (ref 22–32)
Calcium: 9.2 mg/dL (ref 8.9–10.3)
Chloride: 98 mmol/L (ref 98–111)
Creatinine, Ser: 1.77 mg/dL — ABNORMAL HIGH (ref 0.61–1.24)
GFR, Estimated: 40 mL/min — ABNORMAL LOW (ref >60)
Glucose, Bld: 215 mg/dL — ABNORMAL HIGH (ref 70–99)
Potassium: 4.5 mmol/L (ref 3.5–5.1)
Sodium: 133 mmol/L — ABNORMAL LOW (ref 135–145)
Total Bilirubin: 0.3 mg/dL (ref 0.3–1.2)
Total Protein: 7.2 g/dL (ref 6.5–8.1)

## 2022-07-10 LAB — LIPASE, BLOOD: Lipase: 24 U/L (ref 11–51)

## 2022-07-10 MED ORDER — ACETAMINOPHEN-CODEINE 300-30 MG PO TABS
2.0000 | ORAL_TABLET | Freq: Once | ORAL | Status: AC
  Administered 2022-07-10: 2 via ORAL
  Filled 2022-07-10: qty 2

## 2022-07-10 MED ORDER — PANTOPRAZOLE SODIUM 40 MG IV SOLR
40.0000 mg | Freq: Once | INTRAVENOUS | Status: AC
  Administered 2022-07-10: 40 mg via INTRAVENOUS
  Filled 2022-07-10: qty 10

## 2022-07-10 MED ORDER — SODIUM CHLORIDE 0.9 % IV BOLUS
1000.0000 mL | Freq: Once | INTRAVENOUS | Status: AC
  Administered 2022-07-10: 1000 mL via INTRAVENOUS

## 2022-07-10 MED ORDER — HYOSCYAMINE SULFATE 0.125 MG SL SUBL
0.2500 mg | SUBLINGUAL_TABLET | Freq: Once | SUBLINGUAL | Status: AC
  Administered 2022-07-10: 0.25 mg via SUBLINGUAL
  Filled 2022-07-10: qty 2

## 2022-07-10 NOTE — ED Provider Notes (Signed)
  Provider Note MRN:  [id number]  Arrival date & time: 07/11/22    ED Course and Medical Decision Making  Assumed care from Dr. Kathrynn Humble at shift change.  Intermittent dizziness worse with standing, on chemo and radiation for laryngeal cancer.  Awaiting CT imaging.  Will admit if pneumonia is discovered.  1:40 AM update: EKG demonstrating some nonspecific T wave changes.  Troponin obtained and is negative.  CT imaging revealing esophagitis, likely related to recent radiation treatment.  No pneumonia.  On my reassessment patient's symptoms are resolved and he would like to go home.  Advised close follow-up with regular doctors.  Procedures  Final Clinical Impressions(s) / ED Diagnoses     ICD-10-CM   1. Esophagitis  K20.90       ED Discharge Orders     None         Discharge Instructions      You were evaluated in the Emergency Department and after careful evaluation, we did not find any emergent condition requiring admission or further testing in the hospital.  Your exam/testing today is overall reassuring.  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  Recommend over-the-counter liquid Maalox as needed for continued discomfort.  Keep taking your Carafate and your Protonix.  Follow-up with your regular doctors to discuss your symptoms.  Please return to the Emergency Department if you experience any worsening of your condition.   Thank you for allowing [place] to be a part of your care.      Barth Kirks. [place] Small, [place] [email or web address]    Maudie Flakes, MD 07/11/22 [phone]

## 2022-07-10 NOTE — ED Triage Notes (Signed)
Pt presents to ED with multiple complaints including sore throat, abd pain, acid reflux, dizziness, poss seizure or syncope a couple days ago. These symptoms have been ongoing for 5-7 days. Just completed chemo Monday and radiation Tuesday for lung CA

## 2022-07-10 NOTE — ED Provider Notes (Incomplete)
[facility] EMERGENCY DEPARTMENT Provider Note   CSN: [id number] Arrival date & time: 07/10/22  1956     History {Add pertinent medical, surgical, social history, OB history to HPI:1} Chief Complaint  Patient presents with   Dizziness    Finished chemo Monday for lung ca    Colin Wagner is a 71 y.o. male.  HPI     Colin Wagner is a 71 y.o. male who presents to the ER with chief complaint of dizziness, sore throat, epigastric abdominal pain, acid reflux, dizziness. 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   Home Medications Prior to Admission medications   Medication Sig Start Date End Date Taking? Authorizing Provider  acetaminophen (TYLENOL) 500 MG tablet Take 1,000 mg by mouth every 6 (six) hours as needed for mild pain or moderate pain.    [provider]  albuterol (VENTOLIN HFA) 108 (90 Base) MCG/ACT inhaler Inhale 2 puffs into the lungs every 4 (four) hours as needed for wheezing or shortness of breath. 03/29/22   [provider]  amLODipine (NORVASC) 5 MG tablet Take 5 mg by mouth daily. 12/02/20   [provider]  aspirin EC 81 MG tablet Take 81 mg by mouth daily.    [provider]  atorvastatin (LIPITOR) 40 MG tablet Take 40 mg by mouth daily. 02/14/17   [provider]  cholecalciferol (VITAMIN D) 25 MCG (1000 UNIT) tablet Take 1,000 Units by mouth daily.    [provider]  clopidogrel (PLAVIX) 75 MG tablet Take 75 mg by mouth daily. 05/27/19   [provider]  cyclobenzaprine (FLEXERIL) 10 MG tablet Take 10 mg by mouth 4 (four) times daily as needed for muscle spasms. 09/06/18   [provider]  ferrous sulfate 325 (65 FE) MG tablet Take 325 mg by mouth daily with breakfast.    [provider]  fluconazole (DIFLUCAN) 150 MG tablet Take 1 tablet (150 mg total) by mouth every  other day. 06/13/22   Volney American, PA-C  fluticasone [facility]) 50 MCG/ACT nasal spray Place 2 sprays into both nostrils daily.    [provider]  GARLIC PO Take 1 tablet by mouth daily.    [provider]  glipiZIDE (GLUCOTROL XL) 2.5 MG 24 hr tablet Take 2.5 mg by mouth every morning. 08/11/18   [provider]  hydrochlorothiazide (HYDRODIURIL) 12.5 MG tablet Take 12.5 mg by mouth daily. 04/24/19   [provider]  LEVEMIR FLEXTOUCH 100 UNIT/ML Pen Inject 40 Units into the skin at bedtime. X 6 more nights, then return back to 22 units at bedtime Patient taking differently: Inject 32 Units into the skin every evening. 07/31/19   Orson Eva, MD  lidocaine (XYLOCAINE) 2 % solution Patient: Mix 1part 2% viscous lidocaine, 1part H20. Swallow 4mL of diluted mixture, 40min before meals and at bedtime, up to QID for soreness. 06/16/22   [name], MD  lidocaine-prilocaine (EMLA) cream Apply to the Port-A-Cath site 30-60 minutes before treatment. 05/20/22   Curt Bears, MD  lisinopril (ZESTRIL) 5 MG tablet Take 5 mg by mouth daily.    [provider]  LORazepam (ATIVAN) 1 MG tablet Take 1 mg by mouth every 6 (six) hours as needed for anxiety or sleep. 03/31/15   [provider]  nystatin cream (MYCOSTATIN) Apply to affected area 2 times daily 06/13/22   Volney American,  PA-C  pantoprazole (PROTONIX) 40 MG tablet Take 1 tablet (40 mg total) by mouth 2 (two) times daily before a meal. 04/06/22   Rourk, Cristopher Estimable, MD  prochlorperazine (COMPAZINE) 10 MG tablet Take 1 tablet (10 mg total) by mouth every 6 (six) hours as needed for nausea or vomiting. 06/28/22   Heilingoetter, Cassandra L, PA-C  senna (SENOKOT) 8.6 MG TABS tablet Take 1 tablet (8.6 mg total) by mouth at bedtime as needed for mild constipation. May be needed as pain medication can constipate. 01/12/17   [name], MD  Sennosides 25 MG TABS Take 25 mg by mouth daily as  needed (severe constipation).    [provider]  sildenafil (REVATIO) 20 MG tablet Take 60 mg by mouth daily as needed (erectile dysfunction).    [provider]  sildenafil (VIAGRA) 100 MG tablet Take 100 mg by mouth daily as needed for erectile dysfunction.    [provider]  sucralfate (CARAFATE) 1 g tablet Take 1 tablet (1 g total) by mouth 3 (three) times daily as needed. May dissolve 1 tab and glass of water and drink as needed 06/13/22   Volney American, PA-C  tadalafil (CIALIS) 20 MG tablet Take 1 tablet (20 mg total) by mouth daily as needed for erectile dysfunction. Take 30-60 minutes prior to intercourse 11/19/21   Stoneking, Reece Leader., MD  testosterone cypionate (DEPOTESTOSTERONE CYPIONATE) 200 MG/ML injection Inject 1.5 mLs into the muscle every 14 (fourteen) days. 05/22/22   [provider]  Testosterone Cypionate 200 MG/ML SOLN Inject 300 mg as directed every 14 (fourteen) days.    [provider]  traZODone (DESYREL) 50 MG tablet Take 50 mg by mouth at bedtime.    [provider]  umeclidinium-vilanterol (ANORO ELLIPTA) 62.5-25 MCG/ACT AEPB Inhale 1 puff into the lungs daily. 05/19/22   Spero Geralds, MD      Allergies    Patient has no known allergies.    Review of Systems   Review of Systems  Physical Exam Updated Vital Signs BP 116/60   Pulse (!) 113   Temp 99.5 F (37.5 C) (Oral)   Resp 20   Ht 5\' 11"  (1.803 m)   Wt 83 kg   SpO2 98%   BMI 25.52 kg/m  Physical Exam  ED Results / Procedures / Treatments   Labs (all labs ordered are listed, but only abnormal results are displayed) Labs Reviewed - No data to display  EKG None  Radiology No results found.  Procedures Procedures  {Document cardiac monitor, telemetry assessment procedure when appropriate:1}  Medications Ordered in ED Medications - No data to display  ED Course/ Medical Decision Making/ A&P                           Medical  Decision Making Amount and/or Complexity of Data Reviewed Labs: ordered. Radiology: ordered.  Risk Prescription drug management.   ***  {Document critical care time when appropriate:1} {Document review of labs and clinical decision tools ie heart score, Chads2Vasc2 etc:1}  {Document your independent review of radiology images, and any outside records:1} {Document your discussion with family members, caretakers, and with consultants:1} {Document social determinants of health affecting pt's care:1} {Document your decision making why or why not admission, treatments were needed:1} Final Clinical Impression(s) / ED Diagnoses Final diagnoses:  None    Rx / DC Orders ED Discharge Orders     None

## 2022-07-11 ENCOUNTER — Other Ambulatory Visit: Payer: Self-pay

## 2022-07-11 DIAGNOSIS — R569 Unspecified convulsions: Secondary | ICD-10-CM | POA: Diagnosis not present

## 2022-07-11 LAB — RESP PANEL BY RT-PCR (FLU A&B, COVID) ARPGX2
Influenza A by PCR: NEGATIVE
Influenza B by PCR: NEGATIVE
SARS Coronavirus 2 by RT PCR: NEGATIVE

## 2022-07-11 LAB — TROPONIN I (HIGH SENSITIVITY): Troponin I (High Sensitivity): 6 ng/L (ref <18)

## 2022-07-11 NOTE — Discharge Instructions (Signed)
You were evaluated in the Emergency Department and after careful evaluation, we did not find any emergent condition requiring admission or further testing in the hospital.  Your exam/testing today is overall reassuring.  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  Recommend over-the-counter liquid Maalox as needed for continued discomfort.  Keep taking your Carafate and your Protonix.  Follow-up with your regular doctors to discuss your symptoms.  Please return to the Emergency Department if you experience any worsening of your condition.   Thank you for allowing [place] to be a part of your care.

## 2022-07-11 NOTE — ED Notes (Signed)
PO challenge complete with out difficulties

## 2022-07-12 ENCOUNTER — Other Ambulatory Visit: Payer: Self-pay | Admitting: Physician Assistant

## 2022-07-12 ENCOUNTER — Telehealth: Payer: Self-pay

## 2022-07-12 ENCOUNTER — Emergency Department (HOSPITAL_COMMUNITY): Payer: Medicare Other

## 2022-07-12 ENCOUNTER — Other Ambulatory Visit: Payer: Self-pay

## 2022-07-12 ENCOUNTER — Inpatient Hospital Stay (HOSPITAL_COMMUNITY)
Admission: EM | Admit: 2022-07-12 | Discharge: 2022-07-14 | DRG: 101 | Disposition: A | Discharge status: Home / Self Care | Payer: Medicare Other | Attending: Internal Medicine | Admitting: Internal Medicine

## 2022-07-12 DIAGNOSIS — Z905 Acquired absence of kidney: Secondary | ICD-10-CM

## 2022-07-12 DIAGNOSIS — D72819 Decreased white blood cell count, unspecified: Secondary | ICD-10-CM | POA: Diagnosis present

## 2022-07-12 DIAGNOSIS — I10 Essential (primary) hypertension: Secondary | ICD-10-CM | POA: Diagnosis present

## 2022-07-12 DIAGNOSIS — Z8249 Family history of ischemic heart disease and other diseases of the circulatory system: Secondary | ICD-10-CM

## 2022-07-12 DIAGNOSIS — D649 Anemia, unspecified: Secondary | ICD-10-CM | POA: Diagnosis present

## 2022-07-12 DIAGNOSIS — R930 Abnormal findings on diagnostic imaging of skull and head, not elsewhere classified: Secondary | ICD-10-CM | POA: Diagnosis present

## 2022-07-12 DIAGNOSIS — J449 Chronic obstructive pulmonary disease, unspecified: Secondary | ICD-10-CM | POA: Diagnosis present

## 2022-07-12 DIAGNOSIS — E441 Mild protein-calorie malnutrition: Secondary | ICD-10-CM | POA: Diagnosis present

## 2022-07-12 DIAGNOSIS — Z79818 Long term (current) use of other agents affecting estrogen receptors and estrogen levels: Secondary | ICD-10-CM

## 2022-07-12 DIAGNOSIS — E875 Hyperkalemia: Secondary | ICD-10-CM | POA: Diagnosis present

## 2022-07-12 DIAGNOSIS — Z8711 Personal history of peptic ulcer disease: Secondary | ICD-10-CM

## 2022-07-12 DIAGNOSIS — K219 Gastro-esophageal reflux disease without esophagitis: Secondary | ICD-10-CM | POA: Diagnosis present

## 2022-07-12 DIAGNOSIS — K2100 Gastro-esophageal reflux disease with esophagitis, without bleeding: Secondary | ICD-10-CM | POA: Diagnosis present

## 2022-07-12 DIAGNOSIS — Z923 Personal history of irradiation: Secondary | ICD-10-CM

## 2022-07-12 DIAGNOSIS — E11319 Type 2 diabetes mellitus with unspecified diabetic retinopathy without macular edema: Secondary | ICD-10-CM

## 2022-07-12 DIAGNOSIS — E785 Hyperlipidemia, unspecified: Secondary | ICD-10-CM | POA: Diagnosis present

## 2022-07-12 DIAGNOSIS — Z85528 Personal history of other malignant neoplasm of kidney: Secondary | ICD-10-CM

## 2022-07-12 DIAGNOSIS — Z85818 Personal history of malignant neoplasm of other sites of lip, oral cavity, and pharynx: Secondary | ICD-10-CM

## 2022-07-12 DIAGNOSIS — Z87891 Personal history of nicotine dependence: Secondary | ICD-10-CM

## 2022-07-12 DIAGNOSIS — R569 Unspecified convulsions: Secondary | ICD-10-CM | POA: Diagnosis not present

## 2022-07-12 DIAGNOSIS — K861 Other chronic pancreatitis: Secondary | ICD-10-CM | POA: Diagnosis present

## 2022-07-12 DIAGNOSIS — Z7902 Long term (current) use of antithrombotics/antiplatelets: Secondary | ICD-10-CM

## 2022-07-12 DIAGNOSIS — Z20822 Contact with and (suspected) exposure to covid-19: Secondary | ICD-10-CM | POA: Diagnosis present

## 2022-07-12 DIAGNOSIS — E11649 Type 2 diabetes mellitus with hypoglycemia without coma: Secondary | ICD-10-CM | POA: Diagnosis present

## 2022-07-12 DIAGNOSIS — D630 Anemia in neoplastic disease: Secondary | ICD-10-CM | POA: Diagnosis present

## 2022-07-12 DIAGNOSIS — Z8701 Personal history of pneumonia (recurrent): Secondary | ICD-10-CM

## 2022-07-12 DIAGNOSIS — Z79890 Hormone replacement therapy (postmenopausal): Secondary | ICD-10-CM

## 2022-07-12 DIAGNOSIS — G8929 Other chronic pain: Secondary | ICD-10-CM | POA: Diagnosis present

## 2022-07-12 DIAGNOSIS — I6623 Occlusion and stenosis of bilateral posterior cerebral arteries: Secondary | ICD-10-CM | POA: Diagnosis present

## 2022-07-12 DIAGNOSIS — Z794 Long term (current) use of insulin: Secondary | ICD-10-CM

## 2022-07-12 DIAGNOSIS — Z7982 Long term (current) use of aspirin: Secondary | ICD-10-CM

## 2022-07-12 DIAGNOSIS — N179 Acute kidney failure, unspecified: Secondary | ICD-10-CM | POA: Diagnosis present

## 2022-07-12 DIAGNOSIS — Z9079 Acquired absence of other genital organ(s): Secondary | ICD-10-CM

## 2022-07-12 DIAGNOSIS — E291 Testicular hypofunction: Secondary | ICD-10-CM | POA: Diagnosis present

## 2022-07-12 DIAGNOSIS — C3411 Malignant neoplasm of upper lobe, right bronchus or lung: Secondary | ICD-10-CM | POA: Diagnosis present

## 2022-07-12 DIAGNOSIS — Z6825 Body mass index (BMI) 25.0-25.9, adult: Secondary | ICD-10-CM

## 2022-07-12 DIAGNOSIS — Z79891 Long term (current) use of opiate analgesic: Secondary | ICD-10-CM

## 2022-07-12 DIAGNOSIS — R81 Glycosuria: Secondary | ICD-10-CM | POA: Diagnosis present

## 2022-07-12 DIAGNOSIS — Z79899 Other long term (current) drug therapy: Secondary | ICD-10-CM

## 2022-07-12 DIAGNOSIS — Z7984 Long term (current) use of oral hypoglycemic drugs: Secondary | ICD-10-CM

## 2022-07-12 DIAGNOSIS — R824 Acetonuria: Secondary | ICD-10-CM | POA: Diagnosis present

## 2022-07-12 DIAGNOSIS — E119 Type 2 diabetes mellitus without complications: Secondary | ICD-10-CM

## 2022-07-12 LAB — CBC WITH DIFFERENTIAL/PLATELET
Abs Immature Granulocytes: 0.01 10*3/uL (ref 0.00–0.07)
Basophils Absolute: 0 10*3/uL (ref 0.0–0.1)
Basophils Relative: 1 %
Eosinophils Absolute: 0 10*3/uL (ref 0.0–0.5)
Eosinophils Relative: 1 %
HCT: 36.4 % — ABNORMAL LOW (ref 39.0–52.0)
Hemoglobin: 11.2 g/dL — ABNORMAL LOW (ref 13.0–17.0)
Immature Granulocytes: 0 %
Lymphocytes Relative: 5 %
Lymphs Abs: 0.1 10*3/uL — ABNORMAL LOW (ref 0.7–4.0)
MCH: 25.6 pg — ABNORMAL LOW (ref 26.0–34.0)
MCHC: 30.8 g/dL (ref 30.0–36.0)
MCV: 83.3 fL (ref 80.0–100.0)
Monocytes Absolute: 0.4 10*3/uL (ref 0.1–1.0)
Monocytes Relative: 16 %
Neutro Abs: 1.7 10*3/uL (ref 1.7–7.7)
Neutrophils Relative %: 77 %
Platelets: 214 10*3/uL (ref 150–400)
RBC: 4.37 MIL/uL (ref 4.22–5.81)
RDW: 17.7 % — ABNORMAL HIGH (ref 11.5–15.5)
WBC: 2.3 10*3/uL — ABNORMAL LOW (ref 4.0–10.5)
nRBC: 0 % (ref 0.0–0.2)

## 2022-07-12 LAB — CBG MONITORING, ED: Glucose-Capillary: 233 mg/dL — ABNORMAL HIGH (ref 70–99)

## 2022-07-12 LAB — COMPREHENSIVE METABOLIC PANEL
ALT: 15 U/L (ref 0–44)
AST: 23 U/L (ref 15–41)
Albumin: 3.5 g/dL (ref 3.5–5.0)
Alkaline Phosphatase: 69 U/L (ref 38–126)
Anion gap: 11 (ref 5–15)
BUN: 46 mg/dL — ABNORMAL HIGH (ref 8–23)
CO2: 24 mmol/L (ref 22–32)
Calcium: 9.5 mg/dL (ref 8.9–10.3)
Chloride: 96 mmol/L — ABNORMAL LOW (ref 98–111)
Creatinine, Ser: 2.2 mg/dL — ABNORMAL HIGH (ref 0.61–1.24)
GFR, Estimated: 31 mL/min — ABNORMAL LOW (ref >60)
Glucose, Bld: 234 mg/dL — ABNORMAL HIGH (ref 70–99)
Potassium: 5.6 mmol/L — ABNORMAL HIGH (ref 3.5–5.1)
Sodium: 131 mmol/L — ABNORMAL LOW (ref 135–145)
Total Bilirubin: 0.9 mg/dL (ref 0.3–1.2)
Total Protein: 7.6 g/dL (ref 6.5–8.1)

## 2022-07-12 MED ORDER — SODIUM CHLORIDE 0.9 % IV BOLUS
1000.0000 mL | Freq: Once | INTRAVENOUS | Status: AC
  Administered 2022-07-12: 1000 mL via INTRAVENOUS

## 2022-07-12 MED ORDER — HYDROMORPHONE HCL 1 MG/ML IJ SOLN
1.0000 mg | Freq: Once | INTRAMUSCULAR | Status: AC
  Administered 2022-07-13: 1 mg via INTRAVENOUS
  Filled 2022-07-12: qty 1

## 2022-07-12 MED ORDER — ONDANSETRON HCL 4 MG/2ML IJ SOLN
4.0000 mg | Freq: Once | INTRAMUSCULAR | Status: AC
  Administered 2022-07-12: 4 mg via INTRAVENOUS
  Filled 2022-07-12: qty 2

## 2022-07-12 NOTE — ED Provider Triage Note (Signed)
Emergency Medicine Provider Triage Evaluation Note  Colin Wagner , a 71 y.o. male  was evaluated in triage.  Pt complains of new onset seizure-like activity.  Patient states seizures began on Wednesday of last week.  Family at bedside states that he has episodes of shaking which lasted for upwards of a few minutes.  Afterwards he is sleepy and wants to go directly to bed.  She states that during one episode he started shaking really violently but was able to talk to her.  She states that last night he had 3 episodes in a row and during these episodes she had to put her finger in his mouth to "stop him from swallowing his tongue".  She spoke with his oncologist who recommended he come to the emergency department for evaluation.  The patient was evaluated on the 18th at [facility] emergency department due to increased dizziness, sore throat, epigastric abdominal pain, acid reflux, dizziness.  Review of Systems  Positive: As above Negative: As above  Physical Exam  BP (!) 158/80 (BP Location: Right Arm)   Pulse (!) 113   Temp 98.5 F (36.9 C) (Oral)   Resp 20   Ht 5\' 11"  (1.803 m)   Wt 83 kg   SpO2 96%   BMI 25.52 kg/m  Gen:   Awake, no distress   Resp:  Normal effort  MSK:   Moves extremities without difficulty  Other:  Patient with facial droop from previous surgery  Medical Decision Making  Medically screening exam initiated at 6:32 PM.  Appropriate orders placed.  Jeannine Boga was informed that the remainder of the evaluation will be completed by another provider, this initial triage assessment does not replace that evaluation, and the importance of remaining in the ED until their evaluation is complete.     Dorothyann Peng, PA-C 07/12/22 1836

## 2022-07-12 NOTE — ED Provider Notes (Signed)
[facility] DEPT Provider Note   CSN: [id number] Arrival date & time: 07/12/22  1810     History {Add pertinent medical, surgical, social history, OB history to HPI:1} Chief Complaint  Patient presents with   Seizures    Colin Wagner is a 71 y.o. male.  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  Patient's caregiver was at bedside and was able to provide HPI, she states that over the last few days patient been having seizures, she states that the patient had 3 seizures yesterday, she describes the patient becoming very rigid, without full body shaking, no urinary or bowel incontinency's, states that the last about 45 seconds to 1 minute, without postictal state, no history of seizure disorder, no recent head trauma, not anticoag's.  She notes that the patient has not been eating or drinking very much as he has no appetite since he is currently getting chemo therapy as well as radiation therapy, most recent done earlier this week, patient states that he is not having any headaches change in vision paresthesias or weakness in the upper or lower extremities no chest pain shortness of breath stomach pains nausea vomiting diarrhea last bowel movement was today.  I have reviewed patient's chart was seen up at [facility] on the 18th, CMP at his baseline, CBC was also at patient's baseline, lipase within normal limits, troponins without within normal limits, CT of the chest as well as the soft neck shows esophagitis without other acute abnormalities  Home Medications Prior to Admission medications   Medication Sig Start Date End Date Taking? Authorizing Provider  acetaminophen (TYLENOL) 500 MG tablet Take 1,000 mg by mouth every 6 (six) hours as needed for mild pain or moderate pain.    [provider]  albuterol  (VENTOLIN HFA) 108 (90 Base) MCG/ACT inhaler Inhale 2 puffs into the lungs every 4 (four) hours as needed for wheezing or shortness of breath. 03/29/22   [provider]  amLODipine (NORVASC) 5 MG tablet Take 5 mg by mouth daily. 12/02/20   [provider]  aspirin EC 81 MG tablet Take 81 mg by mouth daily.    [provider]  atorvastatin (LIPITOR) 40 MG tablet Take 40 mg by mouth daily. 02/14/17   [provider]  cholecalciferol (VITAMIN D) 25 MCG (1000 UNIT) tablet Take 1,000 Units by mouth daily.    [provider]  clopidogrel (PLAVIX) 75 MG tablet Take 75 mg by mouth daily. 05/27/19   [provider]  cyclobenzaprine (FLEXERIL) 10 MG tablet Take 10 mg by mouth 4 (four) times daily as needed for muscle spasms. 09/06/18   [provider]  ferrous sulfate 325 (65 FE) MG tablet Take 325 mg by mouth daily with breakfast.    [provider]  fluconazole (DIFLUCAN) 150 MG tablet Take 1 tablet (150 mg total) by mouth every other day. 06/13/22   Volney American, PA-C  fluticasone [facility]) 50 MCG/ACT nasal spray Place 2 sprays into both nostrils daily.    [provider]  GARLIC PO Take 1 tablet by mouth daily.    [provider]  glipiZIDE (GLUCOTROL XL) 2.5 MG 24 hr tablet Take 2.5 mg by mouth every morning. 08/11/18   [provider]  hydrochlorothiazide (HYDRODIURIL) 12.5 MG tablet Take 12.5 mg by mouth daily. 04/24/19   [provider]  LEVEMIR FLEXTOUCH 100 UNIT/ML Pen Inject 40 Units into the skin at bedtime. X 6 more nights, then return back to 22 units at bedtime Patient taking differently: Inject 32 Units into the skin every evening. 07/31/19   Orson Eva, MD  lidocaine (XYLOCAINE) 2 % solution Patient: Mix 1part 2% viscous lidocaine, 1part H20. Swallow 49mL of diluted mixture, 63min before meals and at bedtime, up to QID for soreness. 06/16/22   [name], MD  lidocaine-prilocaine  (EMLA) cream Apply to the Port-A-Cath site 30-60 minutes before treatment. 05/20/22   Curt Bears, MD  lisinopril (ZESTRIL) 5 MG tablet Take 5 mg by mouth daily.    [provider]  LORazepam (ATIVAN) 1 MG tablet Take 1 mg by mouth every 6 (six) hours as needed for anxiety or sleep. 03/31/15   [provider]  nystatin cream (MYCOSTATIN) Apply to affected area 2 times daily 06/13/22   Volney American, PA-C  pantoprazole (PROTONIX) 40 MG tablet Take 1 tablet (40 mg total) by mouth 2 (two) times daily before a meal. 04/06/22   Rourk, Cristopher Estimable, MD  prochlorperazine (COMPAZINE) 10 MG tablet Take 1 tablet (10 mg total) by mouth every 6 (six) hours as needed for nausea or vomiting. 06/28/22   Heilingoetter, Cassandra L, PA-C  senna (SENOKOT) 8.6 MG TABS tablet Take 1 tablet (8.6 mg total) by mouth at bedtime as needed for mild constipation. May be needed as pain medication can constipate. 01/12/17   [name], MD  Sennosides 25 MG TABS Take 25 mg by mouth daily as needed (severe constipation).    [provider]  sildenafil (REVATIO) 20 MG tablet Take 60 mg by mouth daily as needed (erectile dysfunction).    [provider]  sildenafil (VIAGRA) 100 MG tablet Take 100 mg by mouth daily as needed for erectile dysfunction.    [provider]  sucralfate (CARAFATE) 1 g tablet Take 1 tablet (1 g total) by mouth 3 (three) times daily as needed. May dissolve 1 tab and glass of water and drink as needed 06/13/22   Volney American, PA-C  tadalafil (CIALIS) 20 MG tablet Take 1 tablet (20 mg total) by mouth daily as needed for erectile dysfunction. Take 30-60 minutes prior to intercourse 11/19/21   Stoneking, Reece Leader., MD  testosterone cypionate (DEPOTESTOSTERONE CYPIONATE) 200 MG/ML injection Inject 1.5 mLs into the muscle every 14 (fourteen) days. 05/22/22   [provider]  Testosterone Cypionate 200 MG/ML SOLN Inject 300 mg as directed every 14  (fourteen) days.    [provider]  traZODone (DESYREL) 50 MG tablet Take 50 mg by mouth at bedtime.    [provider]  umeclidinium-vilanterol (ANORO ELLIPTA) 62.5-25 MCG/ACT AEPB Inhale 1 puff into the lungs daily. 05/19/22   Spero Geralds, MD      Allergies    Patient has no known allergies.    Review of Systems   Review of Systems  Constitutional:  Negative for chills and fever.  Respiratory:  Negative for shortness of breath.   Cardiovascular:  Negative for chest pain.  Gastrointestinal:  Negative for abdominal pain.  Neurological:  Negative for headaches.    Physical Exam Updated Vital Signs BP (!) 156/94   Pulse (!) 110   Temp 98.2 F (36.8 C) (Oral)   Resp (!) 25   Ht 5\' 11"  (1.803 m)   Wt 83 kg   SpO2 94%   BMI 25.52 kg/m  Physical Exam Vitals and nursing  note reviewed.  Constitutional:      General: He is not in acute distress.    Appearance: He is ill-appearing.     Comments: Deconditioned state, ill-appearing  HENT:     Head: Normocephalic and atraumatic.     Comments: There is no deformity of the head present no raccoon eyes or battle sign noted.    Nose: No congestion.     Mouth/Throat:     Mouth: Mucous membranes are dry.     Pharynx: Oropharynx is clear. No oropharyngeal exudate or posterior oropharyngeal erythema.     Comments: Patient has nodule noted on his left mandible, from previous reconsulting surgery no overlying skin changes. Eyes:     Extraocular Movements: Extraocular movements intact.     Conjunctiva/sclera: Conjunctivae normal.     Pupils: Pupils are equal, round, and reactive to light.  Cardiovascular:     Rate and Rhythm: Regular rhythm. Tachycardia present.     Pulses: Normal pulses.     Heart sounds: No murmur heard.    No friction rub. No gallop.  Pulmonary:     Effort: No respiratory distress.     Breath sounds: No wheezing, rhonchi or rales.  Abdominal:     General: There is distension.     Palpations:  Abdomen is soft.     Tenderness: There is no abdominal tenderness. There is no right CVA tenderness or left CVA tenderness.     Comments: Abdomen slightly distended dull, there is no guarding rebound tenderness or peritoneal sign negative Murphy sign McBurney point no CVA tenderness.  Skin:    General: Skin is warm and dry.  Neurological:     Mental Status: He is alert.     GCS: GCS eye subscore is 4. GCS verbal subscore is 5. GCS motor subscore is 6.     Cranial Nerves: No cranial nerve deficit.     Sensory: Sensation is intact.     Coordination: Romberg sign negative. Finger-Nose-Finger Test normal.     Comments: Cranial nerves II through XII grossly intact no difficulty with word finding following two-step commands there is no unilateral weakness present.  Psychiatric:        Mood and Affect: Mood normal.     ED Results / Procedures / Treatments   Labs (all labs ordered are listed, but only abnormal results are displayed) Labs Reviewed  COMPREHENSIVE METABOLIC PANEL - Abnormal; Notable for the following components:      Result Value   Sodium 131 (*)    Potassium 5.6 (*)    Chloride 96 (*)    Glucose, Bld 234 (*)    BUN 46 (*)    Creatinine, Ser 2.20 (*)    GFR, Estimated 31 (*)    All other components within normal limits  CBC WITH DIFFERENTIAL/PLATELET - Abnormal; Notable for the following components:   WBC 2.3 (*)    Hemoglobin 11.2 (*)    HCT 36.4 (*)    MCH 25.6 (*)    RDW 17.7 (*)    Lymphs Abs 0.1 (*)    All other components within normal limits  CBG MONITORING, ED - Abnormal; Notable for the following components:   Glucose-Capillary 233 (*)    All other components within normal limits  RESP PANEL BY RT-PCR (FLU A&B, COVID) ARPGX2  URINALYSIS, ROUTINE W REFLEX MICROSCOPIC  CK  LACTIC ACID, PLASMA  LACTIC ACID, PLASMA    EKG None  Radiology DG Abdomen Acute W/Chest  Result Date: 07/12/2022 CLINICAL  DATA:  Distended abdomen, concern for bowel  obstruction. EXAM: DG ABDOMEN ACUTE WITH 1 VIEW CHEST COMPARISON:  07/10/2022. FINDINGS: There is no evidence of dilated bowel loops or free intraperitoneal air. No radiopaque calculi or other acute radiographic abnormality is seen. Heart size and mediastinal contours are stable. There is redemonstration of a masslike consolidation in the right upper lobe at the paramediastinal border. No consolidation, effusion, or pneumothorax. A stable chest port is noted on the left. Surgical clips are present in the cervical soft tissues and left axilla. IMPRESSION: 1. No evidence of bowel obstruction or free air. 2. Stable masslike consolidation in the right upper lobe. . Electronically Signed   By: Brett Fairy M.D.   On: 07/12/2022 23:29   CT Soft Tissue Neck Wo Contrast  Result Date: 07/11/2022 CLINICAL DATA:  Head and neck cancer. Sore throat. Recent chemotherapy and radiation. EXAM: CT NECK WITHOUT CONTRAST TECHNIQUE: Multidetector CT imaging of the neck was performed following the standard protocol without intravenous contrast. RADIATION DOSE REDUCTION: This exam was performed according to the departmental dose-optimization program which includes automated exposure control, adjustment of the mA and/or kV according to patient size and/or use of iterative reconstruction technique. COMPARISON:  04/19/2017 FINDINGS: Pharynx and larynx: Prior left maxillectomy with reconstruction of the left mandible and a left upper neck flap. There is no local residual or recurrent tumor. Pharyngeal structures are normal. Mild thickening of the epiglottis and circumferential thickening at the larynx is likely a sequela of radiation therapy. No retropharyngeal abnormality. Salivary glands: Normal right parotid gland and residual left parotid gland. Submandibular glands are absent. Thyroid: Normal Lymph nodes: Remote modified left neck dissection. Vascular: Stent in the right internal carotid artery. Atherosclerotic calcification of the  proximal arch vessels and the aortic arch. Limited intracranial: Negative Visualized orbits: Normal Mastoids and visualized paranasal sinuses: Left mastoid and middle ear effusion. Paranasal sinuses are clear. Skeleton: Left mandible reconstruction and partial left maxillectomy. Upper chest: Incompletely visualized medial right upper effusion. Other: None. IMPRESSION: 1. Prior left maxillectomy with reconstruction of the left mandible and a left upper neck flap. No local residual or recurrent tumor. 2. Mild thickening of the epiglottis and circumferential thickening of the larynx is likely a sequela of radiation therapy. 3. Left mastoid and middle ear effusion, also possibly due to radiation. 4. Incompletely visualized right pleural effusion. Aortic Atherosclerosis (ICD10-I70.0). Electronically Signed   By: Ulyses Jarred M.D.   On: 07/11/2022 00:23   CT Chest Wo Contrast  Result Date: 07/11/2022 CLINICAL DATA:  Non-small cell lung cancer.  Pneumonia. EXAM: CT CHEST WITHOUT CONTRAST TECHNIQUE: Multidetector CT imaging of the chest was performed following the standard protocol without IV contrast. RADIATION DOSE REDUCTION: This exam was performed according to the departmental dose-optimization program which includes automated exposure control, adjustment of the mA and/or kV according to patient size and/or use of iterative reconstruction technique. COMPARISON:  Chest x-ray same day. PET-CT 04/12/2022. Chest CT 03/2022. FINDINGS: Cardiovascular: No significant vascular findings. Normal heart size. No pericardial effusion. There are atherosclerotic calcifications of the aorta. Left chest port catheter tip ends at the cavoatrial junction. Mediastinum/Nodes: There is new mild-to-moderate fluid distention of the entire esophagus with some diffuse esophageal wall thickening. The visualized thyroid gland is within normal limits. No enlarged lymph nodes are identified in the mediastinum or hilar regions allowing for lack  of intravenous contrast. Lungs/Pleura: Again seen is right upper lobe bronchus cutoff with complete collapse of the right upper lobe. Masslike density throughout the  right upper lobe is not significantly changed. Left lower lobe fissural nodule measuring 5 mm image 4/90 appears unchanged. There is some atelectasis in the lingula, unchanged. No new pulmonary nodules. No pleural effusion or pneumothorax. Upper Abdomen: Left renal cyst is partially visualized. There are calcifications in the tail of the pancreas compatible with chronic pancreatitis, unchanged. Musculoskeletal: There are surgical changes in the left mandible. No acute fracture. No focal osseous lesion. IMPRESSION: 1. New fluid distention of the esophagus with diffuse esophageal wall thickening. Findings are worrisome for esophagitis. 2. Stable right upper lobe bronchus cutoff with complete collapse of the right upper lobe. 3. Stable 5 mm left lower lobe fissural nodule. No new pulmonary nodules. Aortic Atherosclerosis (ICD10-I70.0). Electronically Signed   By: Ronney Asters M.D.   On: 07/11/2022 00:08    Procedures Procedures  {Document cardiac monitor, telemetry assessment procedure when appropriate:1}  Medications Ordered in ED Medications  sodium chloride 0.9 % bolus 1,000 mL (has no administration in time Wagner)  HYDROmorphone (DILAUDID) injection 1 mg (has no administration in time Wagner)  ondansetron (ZOFRAN) injection 4 mg (has no administration in time Wagner)    ED Course/ Medical Decision Making/ A&P                           Medical Decision Making Amount and/or Complexity of Data Reviewed Labs: ordered. Radiology: ordered.  Risk Prescription drug management.   This patient presents to the ED for concern of ***, this involves an extensive number of treatment options, and is a complaint that carries with it a high risk of complications and morbidity.  The differential diagnosis includes ***    Additional history  obtained:  Additional history obtained from *** External records from outside source obtained and reviewed including ***   Co morbidities that complicate the patient evaluation  ***  Social Determinants of Health:  ***    Lab Tests:  I Ordered, and personally interpreted labs.  The pertinent results include:  ***   Imaging Studies ordered:  I ordered imaging studies including ***  I independently visualized and interpreted imaging which showed *** I agree with the radiologist interpretation   Cardiac Monitoring:  The patient was maintained on a cardiac monitor.  I personally viewed and interpreted the cardiac monitored which showed an underlying rhythm of: ***   Medicines ordered and prescription drug management:  I ordered medication including ***  for ***  I have reviewed the patients home medicines and have made adjustments as needed  Critical Interventions:  ***   Reevaluation:  After the interventions noted above, I reevaluated the patient and found that they have :{resolved/improved/worsened:23923::"improved"}  Consultations Obtained:  I requested consultation with the ***,  and discussed lab and imaging findings as well as pertinent plan - they recommend: ***    Test Considered:  ***    Rule out ****    Dispostion and problem list  After consideration of the diagnostic results and the patients response to treatment, I feel that the patent would benefit from ***.       {Document critical care time when appropriate:1} {Document review of labs and clinical decision tools ie heart score, Chads2Vasc2 etc:1}  {Document your independent review of radiology images, and any outside records:1} {Document your discussion with family members, caretakers, and with consultants:1} {Document social determinants of health affecting pt's care:1} {Document your decision making why or why not admission, treatments were needed:1} Final Clinical  Impression(s) /  ED Diagnoses Final diagnoses:  None    Rx / DC Orders ED Discharge Orders     None

## 2022-07-12 NOTE — Telephone Encounter (Signed)
Rn Anderson Malta called pt caregiver after Dr. Isidore Moos contacted RN Anderson Malta about concern for pt seizure. Rn spoke to both pt and caregiver about the importance of seeking treatment again for continued seizures. Rn was unsure if pt would actually go at this time. Jeanett Schlein stated she would continue to work on getting him to go. Rn Anderson Malta contacted Ed nurse to let them know of the situation.

## 2022-07-12 NOTE — Telephone Encounter (Signed)
Rn Anderson Malta called pt caregiver Colin Wagner to obtain update on pt condition. Colin Wagner had left a message for [employer] to call her back. [name] informed Rn that pt Colin Wagner had started having seizures on Wednesday (was a short seizure then he improved). However she reports he has this activity every night now and that they are getting worse. She states he falls back on the bed and gets stiff (cannot move). He went to the the ED on Wednesday and they did a work up. He went to his primary today and has a referral for neurology ordered. Colin Wagner also states [name] is not eating and drinking well.

## 2022-07-12 NOTE — ED Notes (Signed)
Pt refused to get blood drawn. Pt states that he will only agree to have blood drawn from chest port.

## 2022-07-12 NOTE — ED Notes (Signed)
Pt unable to urinate att. Urinal placed at bedside. Pt aware of UA sample

## 2022-07-12 NOTE — ED Triage Notes (Signed)
Patient came in c/o seizures. Family report pt had 3 seizures last night. Pt denies N/V/D. Pt hx Lung Ca. Family report pt last treatment done was last Tuesday.

## 2022-07-12 NOTE — Telephone Encounter (Signed)
This nurse received a call from, Colin Wagner, stating that she wanted to make this providers office aware of what is happening with the patient.  She states that patient has been having seizure like activity at night time. She describes it as his body just locks up and he is unable to move, his eyes are fixed, and she has to keep him from swallowing his tongue sometimes.  She states that patient had an appointment with his primary care physician this morning and they put in a referral for him to see the neurologist.  This nurse assured her that this information will be forwarded to the provider.  No further questions or concerns at this time.

## 2022-07-12 NOTE — Telephone Encounter (Signed)
This nurse reached out to patient to see how was feeling after hospital discharge on 07/11/2022.  Patient states that he is feeling pretty good.  He will be seeing primary care physician this morning and his GI doctor on 11/21.  Patient knows to contact us if there is any questions or concerns.  No further questions or concerns at this time.

## 2022-07-13 ENCOUNTER — Inpatient Hospital Stay (HOSPITAL_COMMUNITY): Payer: Medicare Other

## 2022-07-13 ENCOUNTER — Telehealth: Payer: Self-pay | Admitting: Pharmacist

## 2022-07-13 ENCOUNTER — Observation Stay (HOSPITAL_COMMUNITY)
Admit: 2022-07-13 | Discharge: 2022-07-13 | Disposition: A | Discharge status: Home / Self Care | Payer: Medicare Other | Attending: Internal Medicine | Admitting: Internal Medicine

## 2022-07-13 ENCOUNTER — Encounter: Payer: Self-pay | Admitting: Internal Medicine

## 2022-07-13 ENCOUNTER — Telehealth: Payer: Self-pay | Admitting: Physician Assistant

## 2022-07-13 ENCOUNTER — Encounter (HOSPITAL_COMMUNITY): Payer: Self-pay | Admitting: Emergency Medicine

## 2022-07-13 ENCOUNTER — Observation Stay (HOSPITAL_COMMUNITY): Payer: Medicare Other

## 2022-07-13 DIAGNOSIS — D630 Anemia in neoplastic disease: Secondary | ICD-10-CM | POA: Diagnosis not present

## 2022-07-13 DIAGNOSIS — N179 Acute kidney failure, unspecified: Secondary | ICD-10-CM | POA: Diagnosis not present

## 2022-07-13 DIAGNOSIS — E11319 Type 2 diabetes mellitus with unspecified diabetic retinopathy without macular edema: Secondary | ICD-10-CM | POA: Diagnosis not present

## 2022-07-13 DIAGNOSIS — R824 Acetonuria: Secondary | ICD-10-CM | POA: Diagnosis not present

## 2022-07-13 DIAGNOSIS — K861 Other chronic pancreatitis: Secondary | ICD-10-CM | POA: Diagnosis not present

## 2022-07-13 DIAGNOSIS — C3411 Malignant neoplasm of upper lobe, right bronchus or lung: Secondary | ICD-10-CM | POA: Diagnosis not present

## 2022-07-13 DIAGNOSIS — R81 Glycosuria: Secondary | ICD-10-CM | POA: Diagnosis not present

## 2022-07-13 DIAGNOSIS — R569 Unspecified convulsions: Secondary | ICD-10-CM

## 2022-07-13 DIAGNOSIS — J449 Chronic obstructive pulmonary disease, unspecified: Secondary | ICD-10-CM | POA: Diagnosis not present

## 2022-07-13 DIAGNOSIS — Z923 Personal history of irradiation: Secondary | ICD-10-CM | POA: Diagnosis not present

## 2022-07-13 DIAGNOSIS — Z20822 Contact with and (suspected) exposure to covid-19: Secondary | ICD-10-CM | POA: Diagnosis not present

## 2022-07-13 DIAGNOSIS — Z6825 Body mass index (BMI) 25.0-25.9, adult: Secondary | ICD-10-CM | POA: Diagnosis not present

## 2022-07-13 DIAGNOSIS — E291 Testicular hypofunction: Secondary | ICD-10-CM | POA: Diagnosis not present

## 2022-07-13 DIAGNOSIS — E441 Mild protein-calorie malnutrition: Secondary | ICD-10-CM | POA: Diagnosis not present

## 2022-07-13 DIAGNOSIS — Z905 Acquired absence of kidney: Secondary | ICD-10-CM | POA: Diagnosis not present

## 2022-07-13 DIAGNOSIS — I10 Essential (primary) hypertension: Secondary | ICD-10-CM | POA: Diagnosis not present

## 2022-07-13 DIAGNOSIS — Z794 Long term (current) use of insulin: Secondary | ICD-10-CM | POA: Diagnosis not present

## 2022-07-13 DIAGNOSIS — I6623 Occlusion and stenosis of bilateral posterior cerebral arteries: Secondary | ICD-10-CM | POA: Diagnosis not present

## 2022-07-13 DIAGNOSIS — R930 Abnormal findings on diagnostic imaging of skull and head, not elsewhere classified: Secondary | ICD-10-CM | POA: Diagnosis present

## 2022-07-13 DIAGNOSIS — Z79899 Other long term (current) drug therapy: Secondary | ICD-10-CM | POA: Diagnosis not present

## 2022-07-13 DIAGNOSIS — E785 Hyperlipidemia, unspecified: Secondary | ICD-10-CM | POA: Diagnosis not present

## 2022-07-13 DIAGNOSIS — E875 Hyperkalemia: Secondary | ICD-10-CM | POA: Diagnosis not present

## 2022-07-13 DIAGNOSIS — G8929 Other chronic pain: Secondary | ICD-10-CM | POA: Diagnosis not present

## 2022-07-13 DIAGNOSIS — D72819 Decreased white blood cell count, unspecified: Secondary | ICD-10-CM | POA: Diagnosis not present

## 2022-07-13 DIAGNOSIS — D649 Anemia, unspecified: Secondary | ICD-10-CM | POA: Diagnosis present

## 2022-07-13 DIAGNOSIS — E11649 Type 2 diabetes mellitus with hypoglycemia without coma: Secondary | ICD-10-CM | POA: Diagnosis not present

## 2022-07-13 DIAGNOSIS — Z87891 Personal history of nicotine dependence: Secondary | ICD-10-CM | POA: Diagnosis not present

## 2022-07-13 LAB — URINALYSIS, ROUTINE W REFLEX MICROSCOPIC
Bilirubin Urine: NEGATIVE
Glucose, UA: 150 mg/dL — AB
Hgb urine dipstick: NEGATIVE
Ketones, ur: 5 mg/dL — AB
Leukocytes,Ua: NEGATIVE
Nitrite: NEGATIVE
Protein, ur: NEGATIVE mg/dL
Specific Gravity, Urine: 1.014 (ref 1.005–1.030)
pH: 5 (ref 5.0–8.0)

## 2022-07-13 LAB — GLUCOSE, CAPILLARY
Glucose-Capillary: 66 mg/dL — ABNORMAL LOW (ref 70–99)
Glucose-Capillary: 172 mg/dL — ABNORMAL HIGH (ref 70–99)
Glucose-Capillary: 218 mg/dL — ABNORMAL HIGH (ref 70–99)

## 2022-07-13 LAB — RESP PANEL BY RT-PCR (FLU A&B, COVID) ARPGX2
Influenza A by PCR: NEGATIVE
Influenza B by PCR: NEGATIVE
SARS Coronavirus 2 by RT PCR: NEGATIVE

## 2022-07-13 LAB — COMPREHENSIVE METABOLIC PANEL
ALT: 14 U/L (ref 0–44)
AST: 21 U/L (ref 15–41)
Albumin: 3.2 g/dL — ABNORMAL LOW (ref 3.5–5.0)
Alkaline Phosphatase: 63 U/L (ref 38–126)
Anion gap: 8 (ref 5–15)
BUN: 40 mg/dL — ABNORMAL HIGH (ref 8–23)
CO2: 26 mmol/L (ref 22–32)
Calcium: 8.9 mg/dL (ref 8.9–10.3)
Chloride: 99 mmol/L (ref 98–111)
Creatinine, Ser: 1.73 mg/dL — ABNORMAL HIGH (ref 0.61–1.24)
GFR, Estimated: 41 mL/min — ABNORMAL LOW (ref >60)
Glucose, Bld: 202 mg/dL — ABNORMAL HIGH (ref 70–99)
Potassium: 5.4 mmol/L — ABNORMAL HIGH (ref 3.5–5.1)
Sodium: 133 mmol/L — ABNORMAL LOW (ref 135–145)
Total Bilirubin: 0.6 mg/dL (ref 0.3–1.2)
Total Protein: 6.8 g/dL (ref 6.5–8.1)

## 2022-07-13 LAB — HEMOGLOBIN A1C
Hgb A1c MFr Bld: 9.4 % — ABNORMAL HIGH (ref 4.8–5.6)
Mean Plasma Glucose: 223.08 mg/dL

## 2022-07-13 LAB — CBC WITH DIFFERENTIAL/PLATELET
Abs Immature Granulocytes: 0.02 10*3/uL (ref 0.00–0.07)
Basophils Absolute: 0 10*3/uL (ref 0.0–0.1)
Basophils Relative: 1 %
Eosinophils Absolute: 0 10*3/uL (ref 0.0–0.5)
Eosinophils Relative: 1 %
HCT: 32.4 % — ABNORMAL LOW (ref 39.0–52.0)
Hemoglobin: 10.2 g/dL — ABNORMAL LOW (ref 13.0–17.0)
Immature Granulocytes: 1 %
Lymphocytes Relative: 5 %
Lymphs Abs: 0.1 10*3/uL — ABNORMAL LOW (ref 0.7–4.0)
MCH: 25.8 pg — ABNORMAL LOW (ref 26.0–34.0)
MCHC: 31.5 g/dL (ref 30.0–36.0)
MCV: 82 fL (ref 80.0–100.0)
Monocytes Absolute: 0.3 10*3/uL (ref 0.1–1.0)
Monocytes Relative: 15 %
Neutro Abs: 1.4 10*3/uL — ABNORMAL LOW (ref 1.7–7.7)
Neutrophils Relative %: 77 %
Platelets: 156 10*3/uL (ref 150–400)
RBC: 3.95 MIL/uL — ABNORMAL LOW (ref 4.22–5.81)
RDW: 18.1 % — ABNORMAL HIGH (ref 11.5–15.5)
WBC: 1.8 10*3/uL — ABNORMAL LOW (ref 4.0–10.5)
nRBC: 0 % (ref 0.0–0.2)

## 2022-07-13 LAB — CK: Total CK: 127 U/L (ref 49–397)

## 2022-07-13 LAB — CBG MONITORING, ED: Glucose-Capillary: 201 mg/dL — ABNORMAL HIGH (ref 70–99)

## 2022-07-13 LAB — MAGNESIUM: Magnesium: 1.9 mg/dL (ref 1.7–2.4)

## 2022-07-13 LAB — LACTIC ACID, PLASMA: Lactic Acid, Venous: 0.8 mmol/L (ref 0.5–1.9)

## 2022-07-13 MED ORDER — CYCLOBENZAPRINE HCL 10 MG PO TABS
10.0000 mg | ORAL_TABLET | Freq: Three times a day (TID) | ORAL | Status: DC | PRN
  Administered 2022-07-13: 10 mg via ORAL
  Filled 2022-07-13: qty 1

## 2022-07-13 MED ORDER — ALBUTEROL SULFATE (2.5 MG/3ML) 0.083% IN NEBU: 3.0000 mL | INHALATION_SOLUTION | RESPIRATORY_TRACT | Status: DC | PRN

## 2022-07-13 MED ORDER — LIDOCAINE VISCOUS HCL 2 % MT SOLN
15.0000 mL | Freq: Once | OROMUCOSAL | Status: AC
  Administered 2022-07-13: 15 mL via ORAL
  Filled 2022-07-13: qty 15

## 2022-07-13 MED ORDER — LORAZEPAM 1 MG PO TABS
1.0000 mg | ORAL_TABLET | Freq: Four times a day (QID) | ORAL | Status: DC | PRN
  Administered 2022-07-13: 1 mg via ORAL
  Filled 2022-07-13: qty 1

## 2022-07-13 MED ORDER — ALUM & MAG HYDROXIDE-SIMETH 200-200-20 MG/5ML PO SUSP
30.0000 mL | Freq: Four times a day (QID) | ORAL | Status: DC | PRN
  Administered 2022-07-13: 30 mL via ORAL
  Filled 2022-07-13: qty 30

## 2022-07-13 MED ORDER — LISINOPRIL 10 MG PO TABS: 5.0000 mg | ORAL_TABLET | Freq: Every day | ORAL | Status: DC

## 2022-07-13 MED ORDER — LEVETIRACETAM IN NACL 500 MG/100ML IV SOLN: 500.0000 mg | Freq: Two times a day (BID) | INTRAVENOUS | Status: DC

## 2022-07-13 MED ORDER — ASPIRIN 81 MG PO TBEC
81.0000 mg | DELAYED_RELEASE_TABLET | Freq: Every day | ORAL | Status: DC
  Administered 2022-07-14: 81 mg via ORAL
  Filled 2022-07-13: qty 1

## 2022-07-13 MED ORDER — INSULIN ASPART 100 UNIT/ML IJ SOLN
0.0000 [IU] | Freq: Three times a day (TID) | INTRAMUSCULAR | Status: DC
  Administered 2022-07-13: 3 [IU] via SUBCUTANEOUS
  Administered 2022-07-13 – 2022-07-14 (×3): 2 [IU] via SUBCUTANEOUS
  Administered 2022-07-14: 1 [IU] via SUBCUTANEOUS
  Filled 2022-07-13: qty 0.09

## 2022-07-13 MED ORDER — ACETAMINOPHEN 325 MG PO TABS
650.0000 mg | ORAL_TABLET | Freq: Four times a day (QID) | ORAL | Status: DC | PRN
  Administered 2022-07-13: 650 mg via ORAL
  Filled 2022-07-13: qty 2

## 2022-07-13 MED ORDER — HYDROCODONE-ACETAMINOPHEN 7.5-325 MG PO TABS
1.0000 | ORAL_TABLET | Freq: Four times a day (QID) | ORAL | Status: DC | PRN
  Administered 2022-07-13 – 2022-07-14 (×2): 1 via ORAL
  Filled 2022-07-13 (×2): qty 1

## 2022-07-13 MED ORDER — LORAZEPAM 2 MG/ML IJ SOLN
0.2500 mg | Freq: Once | INTRAMUSCULAR | Status: AC
  Administered 2022-07-13: 0.25 mg via INTRAVENOUS
  Filled 2022-07-13: qty 1

## 2022-07-13 MED ORDER — SODIUM CHLORIDE 0.9 % IV SOLN
2000.0000 mg | Freq: Once | INTRAVENOUS | Status: AC
  Administered 2022-07-13: 2000 mg via INTRAVENOUS
  Filled 2022-07-13: qty 20

## 2022-07-13 MED ORDER — METFORMIN HCL 500 MG PO TABS: 500.0000 mg | ORAL_TABLET | Freq: Two times a day (BID) | ORAL | Status: DC

## 2022-07-13 MED ORDER — CLOPIDOGREL BISULFATE 75 MG PO TABS
75.0000 mg | ORAL_TABLET | Freq: Every day | ORAL | Status: DC
  Administered 2022-07-13 – 2022-07-14 (×2): 75 mg via ORAL
  Filled 2022-07-13 (×2): qty 1

## 2022-07-13 MED ORDER — UMECLIDINIUM-VILANTEROL 62.5-25 MCG/ACT IN AEPB
1.0000 | INHALATION_SPRAY | Freq: Every day | RESPIRATORY_TRACT | Status: DC
  Administered 2022-07-14: 1 via RESPIRATORY_TRACT
  Filled 2022-07-13: qty 14

## 2022-07-13 MED ORDER — FERROUS SULFATE 325 (65 FE) MG PO TABS
325.0000 mg | ORAL_TABLET | Freq: Every day | ORAL | Status: DC
  Administered 2022-07-14: 325 mg via ORAL
  Filled 2022-07-13: qty 1

## 2022-07-13 MED ORDER — HYDROCHLOROTHIAZIDE 12.5 MG PO TABS
12.5000 mg | ORAL_TABLET | Freq: Every day | ORAL | Status: DC
  Administered 2022-07-13 – 2022-07-14 (×2): 12.5 mg via ORAL
  Filled 2022-07-13 (×2): qty 1

## 2022-07-13 MED ORDER — ALUM & MAG HYDROXIDE-SIMETH 200-200-20 MG/5ML PO SUSP
30.0000 mL | Freq: Once | ORAL | Status: AC
  Administered 2022-07-13: 30 mL via ORAL
  Filled 2022-07-13: qty 30

## 2022-07-13 MED ORDER — PANTOPRAZOLE SODIUM 40 MG PO TBEC
40.0000 mg | DELAYED_RELEASE_TABLET | Freq: Two times a day (BID) | ORAL | Status: DC
  Administered 2022-07-13 – 2022-07-14 (×3): 40 mg via ORAL
  Filled 2022-07-13 (×3): qty 1

## 2022-07-13 MED ORDER — MELATONIN 3 MG PO TABS
3.0000 mg | ORAL_TABLET | Freq: Every evening | ORAL | Status: DC | PRN
  Administered 2022-07-13: 3 mg via ORAL
  Filled 2022-07-13: qty 1

## 2022-07-13 MED ORDER — LACTATED RINGERS IV SOLN: INTRAVENOUS | Status: DC

## 2022-07-13 MED ORDER — SODIUM ZIRCONIUM CYCLOSILICATE 10 G PO PACK
10.0000 g | PACK | Freq: Once | ORAL | Status: AC
  Administered 2022-07-13: 10 g via ORAL
  Filled 2022-07-13: qty 1

## 2022-07-13 MED ORDER — AMLODIPINE BESYLATE 5 MG PO TABS
5.0000 mg | ORAL_TABLET | Freq: Every day | ORAL | Status: DC
  Administered 2022-07-14: 5 mg via ORAL
  Filled 2022-07-13: qty 1

## 2022-07-13 MED ORDER — HYDROMORPHONE HCL 1 MG/ML IJ SOLN
0.5000 mg | Freq: Once | INTRAMUSCULAR | Status: AC
  Administered 2022-07-13: 0.5 mg via INTRAVENOUS
  Filled 2022-07-13: qty 1

## 2022-07-13 MED ORDER — ACETAMINOPHEN 650 MG RE SUPP: 650.0000 mg | Freq: Four times a day (QID) | RECTAL | Status: DC | PRN

## 2022-07-13 MED ORDER — SENNA 8.6 MG PO TABS: 1.0000 | ORAL_TABLET | Freq: Every evening | ORAL | Status: DC | PRN

## 2022-07-13 MED ORDER — GADOBUTROL 1 MMOL/ML IV SOLN
8.0000 mL | Freq: Once | INTRAVENOUS | Status: AC | PRN
  Administered 2022-07-13: 8 mL via INTRAVENOUS

## 2022-07-13 MED ORDER — ATORVASTATIN CALCIUM 40 MG PO TABS
40.0000 mg | ORAL_TABLET | Freq: Every day | ORAL | Status: DC
  Administered 2022-07-13 – 2022-07-14 (×2): 40 mg via ORAL
  Filled 2022-07-13 (×2): qty 1

## 2022-07-13 MED ORDER — GLIPIZIDE ER 2.5 MG PO TB24
2.5000 mg | ORAL_TABLET | Freq: Every morning | ORAL | Status: DC
  Administered 2022-07-14: 2.5 mg via ORAL
  Filled 2022-07-13 (×2): qty 1

## 2022-07-13 NOTE — Progress Notes (Signed)
  Carryover admission to the Day Admitter.  I discussed this case with the EDP, Deno Etienne, PA.  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   He presents with 4-5 episodes over the last 3 days following, which was conveyed by home caregiver: Caregiver patient but still able to follow commands.  These episodes last for less than a minute, typically about 5 seconds or resolving.  Not associate with any tongue biting or loss of bowel/bladder function no overt tonic-clonic activity witnessed.  No known history of underlying seizures.  History also notable for the patient's report of significant decline in oral intake of the last few weeks due to associated loss of appetite.  Presenting labs notable for acute kidney injury, presenting creatinine reported to be 2.2.  Other labs notable for total CPK 127.   EDP discussed patient's case with the on-call neurologist, Dr. 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  Dr. Lorrin Goodell is okay with the patient staying at [facility], and conveys that to [facility] on 07/13/22 to formally consult/see patient. In the meantime, Keppra 2 g IV loading dose, which has been completed followed by Keppra 500 mg IV twice daily, this first dose to occur at neurology also recommends MRI - brain and requests that this be performed with contrast given the need to evaluate for metastatic disease.    I have placed an order for  obs to [facility] for further evaluation/management of the above.   I have placed some additional preliminary admit orders via the adult multi-morbid admission order  set. I have also ordered by neurology. In setting of aki withCr 2.2 , I have held off ordering MRI brain w/ and w/o con for now, and have ordered LR 125 cc/hr to be administered, with repeat cmp order for the AM for close monitoring of renal function.  Seizure precautions ordered.     Babs Bertin, DO Hospitalist

## 2022-07-13 NOTE — Telephone Encounter (Signed)
Colin Wagner called Rn and left message that pt is now agreeing to stay overnight for observation. He does want to be home for Thanksgiving.

## 2022-07-13 NOTE — Progress Notes (Signed)
[facility] admitting physician note.  The patient stated he does not want to be admitted, but I agree with me that he will wait for EEG to be performed.  I told him that neurology also suggested that he may need to [facility] if EEG is normal. The patient will need to sign AMA if he decides to go before neurology suggested work-up is done.  Tennis Must, MD.

## 2022-07-13 NOTE — Procedures (Signed)
Patient Name: Colin Wagner  MRN: [id number]  Epilepsy Attending: Lora Havens  Referring Physician/Provider: Reubin Milan, MD  Date: 07/13/2022  Duration: 22.36 mins  Patient history: 71 y.o. 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 EEG to evaluate for seizure  Level of alertness: Awake  AEDs during EEG study: None  Technical aspects: This EEG study was done with scalp electrodes positioned according to the 10-20 International system of electrode placement. Electrical activity was reviewed with band pass filter of 1-70Hz , sensitivity of 7 uV/mm, display speed of 14mm/sec with a 60Hz  notched filter applied as appropriate. EEG data were recorded continuously and digitally stored.  Video monitoring was available and reviewed as appropriate.  Description: The posterior dominant rhythm consists of 8-9Hz  activity of moderate voltage (25-35 uV) seen predominantly in posterior head regions, symmetric and reactive to eye opening and eye closing.  Hyperventilation and photic stimulation were not performed.     IMPRESSION: This study is within normal limits. No seizures or epileptiform discharges were seen throughout the recording.  A normal interictal EEG does not exclude the diagnosis of epilepsy.  Colin Wagner [name]

## 2022-07-13 NOTE — ED Notes (Signed)
Pt to MRI

## 2022-07-13 NOTE — Telephone Encounter (Signed)
Called patient regarding upcoming December appointments, patient is notified. 

## 2022-07-13 NOTE — H&P (Signed)
History and Physical    Patient: Colin Wagner PPI:[id number] DOB: Jul 01, 1951 DOA: 07/12/2022 DOS: the patient was seen and examined on 07/13/2022 PCP: [name], MD  Patient coming from: Home  Chief Complaint:  Chief Complaint  Patient presents with   Seizures   HPI: Colin Wagner is a 71 y.o. 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  The patient does not remember much of what happened. He denied fever, chills, rhinorrhea, sore throat, wheezing or hemoptysis.  No chest pain, palpitations, diaphoresis, PND, orthopnea or pitting edema of the lower extremities.  No abdominal pain, nausea, emesis, diarrhea, constipation, melena or hematochezia.  No flank pain, dysuria, frequency or hematuria.  No polyuria, polydipsia or polyphagia.  ED course: Initial vital signs were temperature 98.5 F, pulse 113, respiration 20, BP 150/80 mmHg O2 sat 96% on room air.  The patient received 30 mL of Maalox along with 50 mL of viscous lidocaine, hydromorphone 1 mg IVP, hydromorphone 0.5 mg IVP, lorazepam 0.25 mg IVP, ondansetron 4 mg IVP, normal saline 1000 mL bolus and 2000 mg of Keppra.  I added Lokelma 10 g p.o. x1.  Lab work: CBC showed a white count of 2.3, hemoglobin 11.2 g/dL platelets 214.  He is urinalysis showed glucosuria of 150 and ketonuria 5 mg/dL.  Total CK and lactic acid were normal.  Coronavirus and influenza PCR negative.  Imaging: Three-way abdomen with no evidence of bowel obstruction or free air.  Stable masslike consolidation in the right upper lobe.  CT head with no acute intracranial normalities.  MRI  brain with no acute intracranial abnormality.  No evidence of intracranial metastasis.  Mild generalized atrophy.  No specific seizure focus identified.  Some 1 bulbous appearance of the proximal basilar artery MRI or CT angiography of the head is recommended to exclude a Flutiform aneurysm at the site.  Paranasal sinus disease.  Large left middle ear/mastoid effusion.  Review of Systems: As mentioned in the history of present illness. All other systems reviewed and are negative.  Past Medical History:  Diagnosis Date   Alcohol abuse    Alcoholic pancreatitis 0630   admission   Chronic back pain    Chronic pancreatitis ([place])    based on ct findings 2016   COPD (chronic obstructive pulmonary disease) ([place])    Diabetes mellitus    type 2   Diverticulosis    Gastritis    GERD (gastroesophageal reflux disease)    Headache    History of radiation therapy 05/12/17- 06/22/17   Left cheek and bilateral neck/ 60 Gy in 30 fractions to gross disease   Hyperlipidemia    Hypertension    Jaw cancer ([place])    left jaw part of jaw bone removed   Peptic ulcer disease 1999   Per medical reports, no H pylori   Pneumonia    Renal cancer, left ([place]) 2012   he tells me that he has been released, ?and that he is free of cancer, and never had it to begin with.    Right shoulder pain    Testicular hypofunction    Past Surgical History:  Procedure Laterality Date   BALLOON DILATION  12/31/2021   Procedure: BALLOON DILATION;  Surgeon: [name], MD;  [facility];  Service: Endoscopy;;   BIOPSY  04/30/2022   Procedure: BIOPSY;  Surgeon: Maryjane Hurter, MD;  Location: [facility];  Service: Pulmonary;;   CAROTID STENT  06/2018   COLONOSCOPY  2003   Dr. [name], polyps   COLONOSCOPY  2005   Dr. [name], multiple diverticula   COLONOSCOPY  2008   Dr. [name], diverticulosis   COLONOSCOPY WITH PROPOFOL N/A 10/21/2016   Dr. [name]: Diverticulosis, two 5-7 mm polyps removed.  path-tubular adenomas.  Next colonoscopy in 5 years.   COLONOSCOPY WITH PROPOFOL N/A 12/31/2021   Procedure: COLONOSCOPY WITH PROPOFOL;  Surgeon: [name], MD;  [facility];  Service: Endoscopy;  Laterality: N/A;  8:15am   ENDOBRONCHIAL ULTRASOUND Right 04/30/2022   Procedure: ENDOBRONCHIAL ULTRASOUND;  Surgeon: Maryjane Hurter, MD;  Location: [facility];  Service: Pulmonary;  Laterality: Right;   ESOPHAGOGASTRODUODENOSCOPY     Multiple EGDs. 1999 EGD showed gastric ulcers, no H pylori and benign biopsies performed by Dr. [name]. 2001 gastric ulcer healed. Last EGD 2005 had gastritis.   ESOPHAGOGASTRODUODENOSCOPY (EGD) WITH PROPOFOL N/A 12/31/2021   Procedure: ESOPHAGOGASTRODUODENOSCOPY (EGD) WITH PROPOFOL;  Surgeon: [name], MD;  [facility];  Service: Endoscopy;  Laterality: N/A;   FINE NEEDLE ASPIRATION  04/30/2022   Procedure: FINE NEEDLE ASPIRATION (FNA) LINEAR;  Surgeon: Maryjane Hurter, MD;  [facility];  Service: Pulmonary;;   IR GASTROSTOMY TUBE REMOVAL  02/08/2020   IR IMAGING GUIDED PORT INSERTION  05/26/2022   IR REPLACE G-TUBE SIMPLE WO FLUORO  03/20/2019   IR REPLACE G-TUBE SIMPLE WO FLUORO  09/21/2019   IR REPLC GASTRO/COLONIC TUBE PERCUT W/FLUORO  05/16/2018   Left partial mandibulectomy, Scapular free flap reconstruction, selective neck dissection, tracheotomy, and resection of intraoral palate cancer. Left 03/01/2017   [facility]   LUNG BIOPSY     MOUTH SURGERY     PARTIAL NEPHRECTOMY Left 2012   POLYPECTOMY  10/21/2016   Procedure: POLYPECTOMY;  Surgeon: [name], MD;  [facility];  Service: Endoscopy;;  hepatic flexure x2   POLYPECTOMY  12/31/2021   Procedure: POLYPECTOMY INTESTINAL;  Surgeon: [name], MD;  [facility];  Service: Endoscopy;;   SURGERY OF LIP  06/2019   lip revision   TRANSURETHRAL RESECTION OF PROSTATE N/A 03/01/2019   Procedure: TRANSURETHRAL  RESECTION OF THE PROSTATE (TURP)WITH CYSTOSCOPY;  Surgeon: Raynelle Bring, MD;  Location: [facility];  Service: Urology;  Laterality: N/A;   VIDEO BRONCHOSCOPY Right 04/30/2022   Procedure: VIDEO BRONCHOSCOPY WITHOUT FLUORO;  Surgeon: Maryjane Hurter, MD;  [facility];  Service: Pulmonary;  Laterality: Right;   Social History:  reports that he quit smoking about 5 years ago. His smoking use included cigarettes. He has a 17.50 pack-year smoking history. He has never used smokeless tobacco. He reports that he does not drink alcohol and does not use drugs.  No Known Allergies  Family History  Problem Relation Age of Onset   Hypertension Mother    Colon cancer Neg Hx     Prior to Admission medications   Medication Sig Start Date End Date Taking? Authorizing Provider  HYDROcodone-acetaminophen (NORCO) 7.5-325 MG tablet Take by mouth. 06/30/22  Yes [provider]  megestrol (MEGACE) 40 MG/ML suspension Take by mouth. 07/12/22  Yes [provider]  metFORMIN (GLUCOPHAGE) 500 MG tablet Take 500 mg by mouth  2 (two) times daily. 06/12/22  Yes [provider]  SODIUM FLUORIDE 5000 PLUS 1.1 % CREA dental cream every morning. 05/26/22  Yes [provider]  tiZANidine (ZANAFLEX) 4 MG tablet  06/30/22  Yes [provider]  acetaminophen (TYLENOL) 500 MG tablet Take 1,000 mg by mouth every 6 (six) hours as needed for mild pain or moderate pain.    [provider]  albuterol (VENTOLIN HFA) 108 (90 Base) MCG/ACT inhaler Inhale 2 puffs into the lungs every 4 (four) hours as needed for wheezing or shortness of breath. 03/29/22   [provider]  amLODipine (NORVASC) 5 MG tablet Take 5 mg by mouth daily. 12/02/20   [provider]  aspirin EC 81 MG tablet Take 81 mg by mouth daily.    [provider]  atorvastatin (LIPITOR) 40 MG tablet Take 40 mg by mouth daily. 02/14/17   [provider]  cholecalciferol (VITAMIN D) 25  MCG (1000 UNIT) tablet Take 1,000 Units by mouth daily.    [provider]  clopidogrel (PLAVIX) 75 MG tablet Take 75 mg by mouth daily. 05/27/19   [provider]  cyclobenzaprine (FLEXERIL) 10 MG tablet Take 10 mg by mouth 4 (four) times daily as needed for muscle spasms. 09/06/18   [provider]  ferrous sulfate 325 (65 FE) MG tablet Take 325 mg by mouth daily with breakfast.    [provider]  fluconazole (DIFLUCAN) 150 MG tablet Take 1 tablet (150 mg total) by mouth every other day. 06/13/22   Volney American, PA-C  fluticasone [facility]) 50 MCG/ACT nasal spray Place 2 sprays into both nostrils daily.    [provider]  GARLIC PO Take 1 tablet by mouth daily.    [provider]  glipiZIDE (GLUCOTROL XL) 2.5 MG 24 hr tablet Take 2.5 mg by mouth every morning. 08/11/18   [provider]  hydrochlorothiazide (HYDRODIURIL) 12.5 MG tablet Take 12.5 mg by mouth daily. 04/24/19   [provider]  LEVEMIR FLEXTOUCH 100 UNIT/ML Pen Inject 40 Units into the skin at bedtime. X 6 more nights, then return back to 22 units at bedtime Patient taking differently: Inject 32 Units into the skin every evening. 07/31/19   Orson Eva, MD  lidocaine (XYLOCAINE) 2 % solution Patient: Mix 1part 2% viscous lidocaine, 1part H20. Swallow 40mL of diluted mixture, 77min before meals and at bedtime, up to QID for soreness. 06/16/22   [name], MD  lidocaine-prilocaine (EMLA) cream Apply to the Port-A-Cath site 30-60 minutes before treatment. 05/20/22   Curt Bears, MD  lisinopril (ZESTRIL) 5 MG tablet Take 5 mg by mouth daily.    [provider]  LORazepam (ATIVAN) 1 MG tablet Take 1 mg by mouth every 6 (six) hours as needed for anxiety or sleep. 03/31/15   [provider]  nystatin cream (MYCOSTATIN) Apply to affected area 2 times daily 06/13/22   Volney American, PA-C  pantoprazole (PROTONIX) 40 MG tablet Take 1  tablet (40 mg total) by mouth 2 (two) times daily before a meal. 04/06/22   Rourk, Cristopher Estimable, MD  prochlorperazine (COMPAZINE) 10 MG tablet Take 1 tablet (10 mg total) by mouth every 6 (six) hours as needed for nausea or vomiting. 06/28/22   Heilingoetter, Cassandra L, PA-C  senna (SENOKOT) 8.6 MG TABS tablet Take 1 tablet (8.6 mg total) by mouth at bedtime as needed for mild constipation. May be needed as pain medication can constipate. 01/12/17   [name],  MD  Sennosides 25 MG TABS Take 25 mg by mouth daily as needed (severe constipation).    [provider]  sildenafil (REVATIO) 20 MG tablet Take 60 mg by mouth daily as needed (erectile dysfunction).    [provider]  sildenafil (VIAGRA) 100 MG tablet Take 100 mg by mouth daily as needed for erectile dysfunction.    [provider]  sucralfate (CARAFATE) 1 g tablet Take 1 tablet (1 g total) by mouth 3 (three) times daily as needed. May dissolve 1 tab and glass of water and drink as needed 06/13/22   Volney American, PA-C  tadalafil (CIALIS) 20 MG tablet Take 1 tablet (20 mg total) by mouth daily as needed for erectile dysfunction. Take 30-60 minutes prior to intercourse 11/19/21   Stoneking, Reece Leader., MD  testosterone cypionate (DEPOTESTOSTERONE CYPIONATE) 200 MG/ML injection Inject 1.5 mLs into the muscle every 14 (fourteen) days. 05/22/22   [provider]  Testosterone Cypionate 200 MG/ML SOLN Inject 300 mg as directed every 14 (fourteen) days.    [provider]  traZODone (DESYREL) 50 MG tablet Take 50 mg by mouth at bedtime.    [provider]  umeclidinium-vilanterol (ANORO ELLIPTA) 62.5-25 MCG/ACT AEPB Inhale 1 puff into the lungs daily. 05/19/22   Spero Geralds, MD    Physical Exam: Vitals:   07/13/22 0244 07/13/22 0330 07/13/22 0400 07/13/22 0648  BP:  138/73 (!) 144/64   Pulse:  100 (!) 102   Resp:  20 (!) 21   Temp: 97.8 F (36.6 C)   97.9 F (36.6 C)  TempSrc:  Oral   Oral  SpO2:  92% 95%   Weight:      Height:       Physical Exam Vitals and nursing note reviewed.  Constitutional:      General: He is awake. He is not in acute distress.    Appearance: He is overweight.  HENT:     Head: Normocephalic.     Nose: No rhinorrhea.     Mouth/Throat:     Mouth: Mucous membranes are moist.  Eyes:     General: No scleral icterus.    Pupils: Pupils are equal, round, and reactive to light.  Neck:     Vascular: No JVD.  Cardiovascular:     Rate and Rhythm: Normal rate and regular rhythm.     Heart sounds: S1 normal and S2 normal.  Pulmonary:     Effort: Pulmonary effort is normal.     Breath sounds: Normal breath sounds. No wheezing, rhonchi or rales.  Abdominal:     General: Bowel sounds are normal. There is no distension.     Palpations: Abdomen is soft.     Tenderness: There is no abdominal tenderness.  Musculoskeletal:     Cervical back: Neck supple.     Right lower leg: No edema.     Left lower leg: No edema.  Skin:    General: Skin is warm and dry.  Neurological:     General: No focal deficit present.     Mental Status: He is alert and oriented to person, place, and time.  Psychiatric:        Attention and Perception: Attention normal.        Mood and Affect: Affect is angry.        Cognition and Memory: Cognition normal.     Comments: The patient is upset about being in the hospital.  He initially stated that he will  not stay, but agreed to wait for EEG.  He subsequently agreed to stay and stated that he was anxious earlier.   Data Reviewed:  Results are pending, will review when available.  Assessment and Plan: Principal Problem:   Seizure-like activity ([place]) Observation/PCU. Holding Keppra per neurology. Obtain EEG and if negative: May need to go to [facility] for spell capture. Neurology consult appreciated.  Active Problems:   Abnormal MRI of the head  Questionable IC aneurysm. MRA ordered for tomorrow morning.    DM  type 2 (diabetes mellitus, type 2) ([place]) Was hypoglycemic earlier    Hyperlipidemia Continue atorvastatin 40 mg p.o. daily.    Essential hypertension Continue amlodipine 5 mg p.o. daily. Continue HCTZ 12.5 mg p.o. daily. Hold lisinopril due to hyperkalemia. Monitor BP, HR, renal function electrolytes.    Hyperkalemia Lokelma 10 g p.o. daily. Hold ACE inhibitor. Follow-up potassium level tomorrow morning.    GERD (gastroesophageal reflux disease) Continue PPI.    Primary malignant neoplasm of right upper lobe of lung [facility]) Follow-up with oncology as scheduled.    Mild protein malnutrition (H[facility]) Protein supplementation.    Leukocytopenia   Normocytic anemia In the setting of malignancy. Monitor WBC and H&H.    Advance Care Planning:   Code Status: Full Code   Consults: Neurology   Family Communication:   Severity of Illness: The appropriate patient status for this patient is OBSERVATION. Observation status is judged to be reasonable and necessary in order to provide the required intensity of service to ensure the patient's safety. The patient's presenting symptoms, physical exam findings, and initial radiographic and laboratory data in the context of their medical condition is felt to place them at decreased risk for further clinical deterioration. Furthermore, it is anticipated that the patient will be medically stable for discharge from the hospital within 2 midnights of admission.   Author: Reubin Milan, MD 07/13/2022 8:33 AM  For on call review [email or web address].   This document was prepared using Dragon voice recognition software and may contain some unintended transcription errors.

## 2022-07-13 NOTE — Telephone Encounter (Signed)
Pt and family called Juliette Mangle to let her know that pt had decided he wants to go home after his EEG and does not want to stay in hospital. Jeanett Schlein reported he was has been seizure free this visit to the ED. Rn again encouraged him to stay to for a full work up, however he is determined to go home. Jeanett Schlein stated he would follow up with his PCP.

## 2022-07-13 NOTE — Progress Notes (Signed)
EEG complete - results pending 

## 2022-07-13 NOTE — ED Notes (Signed)
Informed pt need for urine for UA, will attempt following bolus completion.

## 2022-07-13 NOTE — ED Notes (Signed)
ED TO INPATIENT HANDOFF REPORT  ED Nurse Name and Phone #: Danise Edge Name/Age/Gender Colin Wagner 71 y.o. male Room/Bed: WA01/WA01  Code Status   Code Status: Full Code  Home/SNF/Other Home Patient oriented to: self, place, time, and situation Is this baseline? Yes   Triage Complete: Triage complete  Chief Complaint Seizure-like activity [facility]) [R56.9]  Triage Note Patient came in c/o seizures. Family report pt had 3 seizures last night. Pt denies N/V/D. Pt hx Lung Ca. Family report pt last treatment done was last Tuesday.   Allergies No Known Allergies  Level of Care/Admitting Diagnosis ED Disposition     ED Disposition  Admit   Condition  --   Comment  Hospital Area: [place] [[id number]]  Level of Care: Progressive [102]  Admit to Progressive based on following criteria: MULTISYSTEM THREATS such as stable sepsis, metabolic/electrolyte imbalance with or without encephalopathy that is responding to early treatment.  May place patient in observation at [facility] or [facility] if equivalent level of care is available:: No  Covid Evaluation: Asymptomatic - no recent exposure (last 10 days) testing not required  Diagnosis: Seizure-like activity [facility]) [[id number]]  Admitting Physician: Rhetta Mura [[id number]]  Attending Physician: Rhetta Mura [[id number]]          B Medical/Surgery History Past Medical History:  Diagnosis Date   Alcohol abuse    Alcoholic pancreatitis 3329   admission   Chronic back pain    Chronic pancreatitis ([place])    based on ct findings 2016   COPD (chronic obstructive pulmonary disease) ([place])    Diabetes mellitus    type 2   Diverticulosis    Gastritis    GERD (gastroesophageal reflux disease)    Headache    History of radiation therapy 05/12/17- 06/22/17   Left cheek and bilateral neck/ 60 Gy in 30 fractions to gross disease   Hyperlipidemia    Hypertension    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    Right shoulder pain    Testicular hypofunction    Past Surgical History:  Procedure Laterality Date   BALLOON DILATION  12/31/2021   Procedure: BALLOON DILATION;  Surgeon: [name], MD;  [facility];  Service: Endoscopy;;   BIOPSY  04/30/2022   Procedure: BIOPSY;  Surgeon: Maryjane Hurter, MD;  Location[facility];  Service: Pulmonary;;   CAROTID STENT  06/2018   COLONOSCOPY  2003   Dr. [name], polyps   COLONOSCOPY  2005   Dr. [name], multiple diverticula   COLONOSCOPY  2008   Dr. [name], diverticulosis   COLONOSCOPY WITH PROPOFOL N/A 10/21/2016   Dr. [name]: Diverticulosis, two 5-7 mm polyps removed. path-tubular adenomas.  Next colonoscopy in 5 years.   COLONOSCOPY WITH PROPOFOL N/A 12/31/2021   Procedure: COLONOSCOPY WITH PROPOFOL;  Surgeon: [name], MD;  [facility];  Service: Endoscopy;  Laterality: N/A;  8:15am   ENDOBRONCHIAL ULTRASOUND Right 04/30/2022   Procedure: ENDOBRONCHIAL ULTRASOUND;  Surgeon: Maryjane Hurter, MD;  [facility];  Service: Pulmonary;  Laterality: Right;   ESOPHAGOGASTRODUODENOSCOPY     Multiple EGDs.  1999 EGD showed gastric ulcers, no H pylori and benign biopsies performed by Dr. [name]. 2001 gastric ulcer healed. Last EGD 2005 had gastritis.   ESOPHAGOGASTRODUODENOSCOPY (EGD) WITH PROPOFOL N/A 12/31/2021   Procedure: ESOPHAGOGASTRODUODENOSCOPY (EGD) WITH PROPOFOL;  Surgeon: [name], MD;  [facility];  Service: Endoscopy;  Laterality: N/A;   FINE NEEDLE ASPIRATION  04/30/2022   Procedure: FINE NEEDLE ASPIRATION (FNA) LINEAR;  Surgeon: Maryjane Hurter, MD;  [facility];  Service: Pulmonary;;   IR GASTROSTOMY TUBE REMOVAL  02/08/2020    IR IMAGING GUIDED PORT INSERTION  05/26/2022   IR REPLACE G-TUBE SIMPLE WO FLUORO  03/20/2019   IR REPLACE G-TUBE SIMPLE WO FLUORO  09/21/2019   IR REPLC GASTRO/COLONIC TUBE PERCUT W/FLUORO  05/16/2018   Left partial mandibulectomy, Scapular free flap reconstruction, selective neck dissection, tracheotomy, and resection of intraoral palate cancer. Left 03/01/2017   [facility]   LUNG BIOPSY     MOUTH SURGERY     PARTIAL NEPHRECTOMY Left 2012   POLYPECTOMY  10/21/2016   Procedure: POLYPECTOMY;  Surgeon: [name], MD;  [facility];  Service: Endoscopy;;  hepatic flexure x2   POLYPECTOMY  12/31/2021   Procedure: POLYPECTOMY INTESTINAL;  Surgeon: [name], MD;  [facility];  Service: Endoscopy;;   SURGERY OF LIP  06/2019   lip revision   TRANSURETHRAL RESECTION OF PROSTATE N/A 03/01/2019   Procedure: TRANSURETHRAL RESECTION OF THE PROSTATE (TURP)WITH CYSTOSCOPY;  Surgeon: Raynelle Bring, MD;  Location[facility];  Service: Urology;  Laterality: N/A;   VIDEO BRONCHOSCOPY Right 04/30/2022   Procedure: VIDEO BRONCHOSCOPY WITHOUT FLUORO;  Surgeon: Maryjane Hurter, MD;  [facility];  Service: Pulmonary;  Laterality: Right;     A IV Location/Drains/Wounds Patient Lines/Drains/Airways Status     Active Line/Drains/Airways     Name Placement date Placement time Site Days   Implanted Port 05/26/22 Left Chest 05/26/22  1417  Chest  48   Peripheral IV 07/12/22 20 G 1" Left;Posterior Hand 07/12/22  2150  Hand  1   Pressure Injury 04/20/17 Stage I -  Intact skin with non-blanchable redness of a localized area usually over a bony prominence. healing stage 2, skin now closed 04/20/17  0848  -- 1910   Wound / Incision (Open or Dehisced) 04/20/17 Non-pressure wound;Other (Comment) Back Left;Upper healing; removed muscle - relocated for facial surgery 04/20/17  0847  Back  1910            Intake/Output Last 24 hours No intake or output  data in the 24 hours ending 07/13/22 1451  Labs/Imaging Results for orders placed or performed during the hospital encounter of 07/12/22 (from the past 48 hour(s))  CBG monitoring, ED     Status: Abnormal   Collection Time: 07/12/22  6:36 PM  Result Value Ref Range   Glucose-Capillary 233 (H) 70 - 99 mg/dL    Comment: Glucose reference range applies only to samples taken after fasting for at least 8 hours.  Comprehensive metabolic panel     Status: Abnormal   Collection Time: 07/12/22 10:00 PM  Result Value Ref Range   Sodium 131 (L) 135 - 145 mmol/L   Potassium 5.6 (H) 3.5 - 5.1 mmol/L   Chloride 96 (L) 98 - 111 mmol/L   CO2 24 22 - 32 mmol/L   Glucose, Bld 234 (H) 70 - 99 mg/dL    Comment: Glucose reference range applies only to  samples taken after fasting for at least 8 hours.   BUN 46 (H) 8 - 23 mg/dL   Creatinine, Ser 2.20 (H) 0.61 - 1.24 mg/dL   Calcium 9.5 8.9 - 10.3 mg/dL   Total Protein 7.6 6.5 - 8.1 g/dL   Albumin 3.5 3.5 - 5.0 g/dL   AST 23 15 - 41 U/L   ALT 15 0 - 44 U/L   Alkaline Phosphatase 69 38 - 126 U/L   Total Bilirubin 0.9 0.3 - 1.2 mg/dL   GFR, Estimated 31 (L) >60 mL/min    Comment: (NOTE) Calculated using the CKD-EPI Creatinine Equation (2021)    Anion gap 11 5 - 15    Comment: Performed at [facility], [place] [address], [place], [place] [address]  CBC with Differential     Status: Abnormal   Collection Time: 07/12/22 10:00 PM  Result Value Ref Range   WBC 2.3 (L) 4.0 - 10.5 K/uL   RBC 4.37 4.22 - 5.81 MIL/uL   Hemoglobin 11.2 (L) 13.0 - 17.0 g/dL   HCT 36.4 (L) 39.0 - 52.0 %   MCV 83.3 80.0 - 100.0 fL   MCH 25.6 (L) 26.0 - 34.0 pg   MCHC 30.8 30.0 - 36.0 g/dL   RDW 17.7 (H) 11.5 - 15.5 %   Platelets 214 150 - 400 K/uL   nRBC 0.0 0.0 - 0.2 %   Neutrophils Relative % 77 %   Neutro Abs 1.7 1.7 - 7.7 K/uL   Lymphocytes Relative 5 %   Lymphs Abs 0.1 (L) 0.7 - 4.0 K/uL   Monocytes Relative 16 %   Monocytes Absolute 0.4 0.1 - 1.0  K/uL   Eosinophils Relative 1 %   Eosinophils Absolute 0.0 0.0 - 0.5 K/uL   Basophils Relative 1 %   Basophils Absolute 0.0 0.0 - 0.1 K/uL   Immature Granulocytes 0 %   Abs Immature Granulocytes 0.01 0.00 - 0.07 K/uL    Comment: Performed at [facility], [place] [address], [place], [place] [address]  CK     Status: None   Collection Time: 07/13/22 12:03 AM  Result Value Ref Range   Total CK 127 49 - 397 U/L    Comment: Performed at [facility], [place] [address] [place], [place] [address]  Lactic acid, plasma     Status: None   Collection Time: 07/13/22 12:03 AM  Result Value Ref Range   Lactic Acid, Venous 0.8 0.5 - 1.9 mmol/L    Comment: Performed at [facility], [place] [address], [place], [place] [address]  Resp Panel by RT-PCR (Flu A&B, Covid) Anterior Nasal Swab     Status: None   Collection Time: 07/13/22 12:03 AM   Specimen: Anterior Nasal Swab  Result Value Ref Range   SARS Coronavirus 2 by RT PCR NEGATIVE NEGATIVE    Comment: (NOTE) SARS-CoV-2 target nucleic acids are NOT DETECTED.  The SARS-CoV-2 RNA is generally detectable in upper respiratory specimens during the acute phase of infection. The lowest concentration of SARS-CoV-2 viral copies this assay can detect is 138 copies/mL. A negative result does not preclude SARS-Cov-2 infection and should not be used as the sole basis for treatment or other patient management decisions. A negative result may occur with  improper specimen collection/handling, submission of specimen other than nasopharyngeal swab, presence of viral mutation(s) within the areas targeted by this assay, and inadequate number of viral copies(<138 copies/mL). A negative result must be combined with  clinical observations, patient history, and epidemiological information. The expected result is Negative.  Fact Sheet for Patients:  EntrepreneurPulse.com.au  Fact Sheet for  Healthcare Providers:  IncredibleEmployment.be  This test is no t yet approved or cleared by the [place] FDA and  has been authorized for detection and/or diagnosis of SARS-CoV-2 by FDA under an Emergency Use Authorization (EUA). This EUA will remain  in effect (meaning this test can be used) for the duration of the COVID-19 declaration under Section 564(b)(1) of the Act, 21 U.S.C.section 360bbb-3(b)(1), unless the authorization is terminated  or revoked sooner.       Influenza A by PCR NEGATIVE NEGATIVE   Influenza B by PCR NEGATIVE NEGATIVE    Comment: (NOTE) The Xpert Xpress SARS-CoV-2/FLU/RSV plus assay is intended as an aid in the diagnosis of influenza from Nasopharyngeal swab specimens and should not be used as a sole basis for treatment. Nasal washings and aspirates are unacceptable for Xpert Xpress SARS-CoV-2/FLU/RSV testing.  Fact Sheet for Patients: EntrepreneurPulse.com.au  Fact Sheet for Healthcare Providers: IncredibleEmployment.be  This test is not yet approved or cleared by the [place] FDA and has been authorized for detection and/or diagnosis of SARS-CoV-2 by FDA under an Emergency Use Authorization (EUA). This EUA will remain in effect (meaning this test can be used) for the duration of the COVID-19 declaration under Section 564(b)(1) of the Act, 21 U.S.C. section 360bbb-3(b)(1), unless the authorization is terminated or revoked.  Performed at [facility], [place] [address], [place], [place] [address]   Urinalysis, Routine w reflex microscopic Urine, Clean Catch     Status: Abnormal   Collection Time: 07/13/22 12:57 AM  Result Value Ref Range   Color, Urine YELLOW YELLOW   APPearance CLEAR CLEAR   Specific Gravity, Urine 1.014 1.005 - 1.030   pH 5.0 5.0 - 8.0   Glucose, UA 150 (A) NEGATIVE mg/dL   Hgb urine dipstick NEGATIVE NEGATIVE   Bilirubin Urine NEGATIVE NEGATIVE    Ketones, ur 5 (A) NEGATIVE mg/dL   Protein, ur NEGATIVE NEGATIVE mg/dL   Nitrite NEGATIVE NEGATIVE   Leukocytes,Ua NEGATIVE NEGATIVE    Comment: Performed at [facility], [place] [address], [place], [place] [address]  CBC with Differential/Platelet     Status: Abnormal   Collection Time: 07/13/22  5:00 AM  Result Value Ref Range   WBC 1.8 (L) 4.0 - 10.5 K/uL   RBC 3.95 (L) 4.22 - 5.81 MIL/uL   Hemoglobin 10.2 (L) 13.0 - 17.0 g/dL   HCT 32.4 (L) 39.0 - 52.0 %   MCV 82.0 80.0 - 100.0 fL   MCH 25.8 (L) 26.0 - 34.0 pg   MCHC 31.5 30.0 - 36.0 g/dL   RDW 18.1 (H) 11.5 - 15.5 %   Platelets 156 150 - 400 K/uL   nRBC 0.0 0.0 - 0.2 %   Neutrophils Relative % 77 %   Neutro Abs 1.4 (L) 1.7 - 7.7 K/uL   Lymphocytes Relative 5 %   Lymphs Abs 0.1 (L) 0.7 - 4.0 K/uL   Monocytes Relative 15 %   Monocytes Absolute 0.3 0.1 - 1.0 K/uL   Eosinophils Relative 1 %   Eosinophils Absolute 0.0 0.0 - 0.5 K/uL   Basophils Relative 1 %   Basophils Absolute 0.0 0.0 - 0.1 K/uL   Immature Granulocytes 1 %   Abs Immature Granulocytes 0.02 0.00 - 0.07 K/uL    Comment: Performed at [facility], [place] [address] [place], [place]  [address]  Hemoglobin A1c     Status: Abnormal   Collection Time: 07/13/22  5:00 AM  Result Value Ref Range   Hgb A1c MFr Bld 9.4 (H) 4.8 - 5.6 %    Comment: (NOTE) Pre diabetes:          5.7%-6.4%  Diabetes:              >6.4%  Glycemic control for   <7.0% adults with diabetes    Mean Plasma Glucose 223.08 mg/dL    Comment: Performed at [place] [address] [place], [place] [address]  Comprehensive metabolic panel     Status: Abnormal   Collection Time: 07/13/22  6:11 AM  Result Value Ref Range   Sodium 133 (L) 135 - 145 mmol/L   Potassium 5.4 (H) 3.5 - 5.1 mmol/L   Chloride 99 98 - 111 mmol/L   CO2 26 22 - 32 mmol/L   Glucose, Bld 202 (H) 70 - 99 mg/dL    Comment: Glucose reference range applies only to samples taken  after fasting for at least 8 hours.   BUN 40 (H) 8 - 23 mg/dL   Creatinine, Ser 1.73 (H) 0.61 - 1.24 mg/dL   Calcium 8.9 8.9 - 10.3 mg/dL   Total Protein 6.8 6.5 - 8.1 g/dL   Albumin 3.2 (L) 3.5 - 5.0 g/dL   AST 21 15 - 41 U/L   ALT 14 0 - 44 U/L   Alkaline Phosphatase 63 38 - 126 U/L   Total Bilirubin 0.6 0.3 - 1.2 mg/dL   GFR, Estimated 41 (L) >60 mL/min    Comment: (NOTE) Calculated using the CKD-EPI Creatinine Equation (2021)    Anion gap 8 5 - 15    Comment: Performed at [facility], [place] [address], [place], [place] [address]  Magnesium     Status: None   Collection Time: 07/13/22  6:11 AM  Result Value Ref Range   Magnesium 1.9 1.7 - 2.4 mg/dL    Comment: Performed at [facility], [place] [address], [place], [place] [address]  CBG monitoring, ED     Status: Abnormal   Collection Time: 07/13/22  1:11 PM  Result Value Ref Range   Glucose-Capillary 201 (H) 70 - 99 mg/dL    Comment: Glucose reference range applies only to samples taken after fasting for at least 8 hours.   MR Brain W and Wo Contrast  Result Date: 07/13/2022 CLINICAL DATA:  Provided history: Seizure, new onset, no history of trauma. Additional history provided: History of squamous cell cancer of jaw, non-small cell lung cancer. EXAM: MRI HEAD WITHOUT AND WITH CONTRAST TECHNIQUE: Multiplanar, multiecho pulse sequences of the brain and surrounding structures were obtained without and with intravenous contrast. CONTRAST:  8 mL Gadavist intravenous contrast. COMPARISON:  Head CT 07/12/2022.  Brain MRI 05/14/2022. FINDINGS: Mild intermittent motion degradation. Brain: Mild generalized cerebral atrophy. No cortical encephalomalacia is identified. No significant cerebral white matter disease for age. No appreciable hippocampal size or signal asymmetry. There is no acute infarct. No evidence of an intracranial mass. No chronic intracranial blood products. No extra-axial fluid collection. No  midline shift. No pathologic intracranial enhancement identified. Vascular: Redemonstrated developmental venous anomaly within the anterolateral left frontal lobe. Somewhat bulbous appearance of the proximal basilar artery (instance as seen on series 17, image 15) (series 8, image 5). Skull and upper cervical spine: No focal suspicious marrow lesion. Sinuses/Orbits: No mass or acute finding within the imaged  orbits. Chronic medially displaced fracture deformity of the right lamina papyracea. Mild-to-moderate mucosal thickening within the left maxillary sinus. Mucosal thickening within the bilateral ethmoid air cells (mild-to-moderate right, minimal left). Other: Large left middle ear/mastoid effusion. Incompletely assessed postsurgical changes to the left mandible. IMPRESSION: 1. Mildly motion degraded exam. 2. No evidence of acute intracranial abnormality. 3. No evidence of intracranial metastatic disease. 4. No specific seizure focus is identified. 5. Mild generalized cerebral atrophy. 6. Somewhat bulbous appearance of the proximal basilar artery. MR or CT angiography of the head is recommended to exclude a fusiform aneurysm at this site. 7. Paranasal sinus disease, as described. 8. Large left middle ear/mastoid effusion. Electronically Signed   By: Kellie Simmering D.O.   On: 07/13/2022 08:56   CT Head Wo Contrast  Result Date: 07/12/2022 CLINICAL DATA:  Seizure, new-onset, no history of trauma EXAM: CT HEAD WITHOUT CONTRAST TECHNIQUE: Contiguous axial images were obtained from the base of the skull through the vertex without intravenous contrast. RADIATION DOSE REDUCTION: This exam was performed according to the departmental dose-optimization program which includes automated exposure control, adjustment of the mA and/or kV according to patient size and/or use of iterative reconstruction technique. COMPARISON:  MRI head 05/14/2022 FINDINGS: Brain: Cerebral ventricle sizes are concordant with the degree of  cerebral volume loss. No evidence of large-territorial acute infarction. No parenchymal hemorrhage. No mass lesion. No extra-axial collection. No mass effect or midline shift. No hydrocephalus. Basilar cisterns are patent. Vascular: No hyperdense vessel. Skull: No acute fracture or focal lesion. Sinuses/Orbits: Paranasal sinuses and mastoid air cells are clear. The orbits are unremarkable. Other: None. IMPRESSION: No acute intracranial abnormality. Electronically Signed   By: Iven Finn M.D.   On: 07/12/2022 23:51   DG Abdomen Acute W/Chest  Result Date: 07/12/2022 CLINICAL DATA:  Distended abdomen, concern for bowel obstruction. EXAM: DG ABDOMEN ACUTE WITH 1 VIEW CHEST COMPARISON:  07/10/2022. FINDINGS: There is no evidence of dilated bowel loops or free intraperitoneal air. No radiopaque calculi or other acute radiographic abnormality is seen. Heart size and mediastinal contours are stable. There is redemonstration of a masslike consolidation in the right upper lobe at the paramediastinal border. No consolidation, effusion, or pneumothorax. A stable chest port is noted on the left. Surgical clips are present in the cervical soft tissues and left axilla. IMPRESSION: 1. No evidence of bowel obstruction or free air. 2. Stable masslike consolidation in the right upper lobe. . Electronically Signed   By: Brett Fairy M.D.   On: 07/12/2022 23:29    Pending Labs Unresulted Labs (From admission, onward)    None       Vitals/Pain Today's Vitals   07/13/22 0648 07/13/22 0700 07/13/22 1020 07/13/22 1441  BP:   (!) 170/86 101/66  Pulse:  (!) 114 (!) 109 94  Resp:  (!) 22 17 18   Temp: 97.9 F (36.6 C)  98.3 F (36.8 C) 98.2 F (36.8 C)  TempSrc: Oral  Oral Oral  SpO2:  92% 96% 98%  Weight:      Height:      PainSc:        Isolation Precautions No active isolations  Medications Medications  acetaminophen (TYLENOL) tablet 650 mg (has no administration in time range)    Or   acetaminophen (TYLENOL) suppository 650 mg (has no administration in time range)  melatonin tablet 3 mg (has no administration in time range)  lactated ringers infusion ( Intravenous New Bag/Given 07/13/22 0501)  insulin aspart (novoLOG) injection 0-9  Units (3 Units Subcutaneous Given 07/13/22 1326)  umeclidinium-vilanterol (ANORO ELLIPTA) 62.5-25 MCG/ACT 1 puff (1 puff Inhalation Not Given 07/13/22 1000)  aspirin EC tablet 81 mg (has no administration in time range)  amLODipine (NORVASC) tablet 5 mg (has no administration in time range)  glipiZIDE (GLUCOTROL XL) 24 hr tablet 2.5 mg (has no administration in time range)  HYDROcodone-acetaminophen (NORCO) 7.5-325 MG per tablet 1 tablet (has no administration in time range)  pantoprazole (PROTONIX) EC tablet 40 mg (has no administration in time range)  metFORMIN (GLUCOPHAGE) tablet 500 mg (has no administration in time range)  LORazepam (ATIVAN) tablet 1 mg (has no administration in time range)  sodium chloride 0.9 % bolus 1,000 mL (0 mLs Intravenous Stopped 07/13/22 0050)  HYDROmorphone (DILAUDID) injection 1 mg (1 mg Intravenous Given 07/13/22 0000)  ondansetron (ZOFRAN) injection 4 mg (4 mg Intravenous Given 07/12/22 2358)  alum & mag hydroxide-simeth (MAALOX/MYLANTA) 200-200-20 MG/5ML suspension 30 mL (30 mLs Oral Given 07/13/22 0044)    And  lidocaine (XYLOCAINE) 2 % viscous mouth solution 15 mL (15 mLs Oral Given 07/13/22 0044)  LORazepam (ATIVAN) injection 0.25 mg (0.25 mg Intravenous Given 07/13/22 0045)  levETIRAcetam (KEPPRA) 2,000 mg in sodium chloride 0.9 % 250 mL IVPB (0 mg Intravenous Stopped 07/13/22 0256)  HYDROmorphone (DILAUDID) injection 0.5 mg (0.5 mg Intravenous Given 07/13/22 0250)  gadobutrol (GADAVIST) 1 MMOL/ML injection 8 mL (8 mLs Intravenous Contrast Given 07/13/22 0837)  sodium zirconium cyclosilicate (LOKELMA) packet 10 g (10 g Oral Given 07/13/22 1300)    Mobility walks     Focused Assessments      R Recommendations: See Admitting Provider Note  Report given to:   Additional Notes:

## 2022-07-13 NOTE — Consult Note (Signed)
NEUROLOGY CONSULTATION NOTE   Date of service: July 13, 2022 Patient Name: Colin Wagner MRN:  [id number] DOB:  1950/11/01 Reason for consult: "Seizures x 6" Requesting Provider: Rhetta Mura, DO _ _ _   _ __   _ __ _ _  __ __   _ __   __ _  History of Present Illness  Colin Wagner is a 71 y.o. 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 Patient has poor recollection of these episodes. History provided by patient's fiance who is also his care taker. She reports that on Wednesday, patient just sat up in the bed and then went rigid and felt back in the bed. He was stiff for 1-2 mins and arms were out and eyes closed. He was able to talk to her afterwards but all he wanted to do was just sleep. He slept from 1030 PM all th way to 930 in the morning and had breakfast and then wanted to sleep again. He had 2 more idential episodes on Thursday, 2 more on Friday and 3 on Saturday. No prior hx of seizures before this. Used to drink EtOH but not anymore.  Workup in the ED with [facility] with no acute abnormalities. Most recent MRI Brain concerning for a potential left frontal lobe venous anomaly but significantly motion degraded.  Patient and wife report poor appetite and patient not feeling like he wants to eat or drink. Labs concerning for dehydration with mild AKI.   ROS   Constitutional Denies weight loss, fever and chills.   HEENT Denies changes in vision and hearing.   Respiratory Denies SOB and cough.   CV Denies palpitations and CP   GI Denies abdominal pain, nausea, vomiting and diarrhea.   GU Denies dysuria and urinary frequency.   MSK Denies myalgia and joint pain.   Skin Denies rash and pruritus.   Neurological Denies headache and syncope.   Psychiatric Denies recent changes in mood. Denies anxiety and depression.    Past History   Past  Medical History:  Diagnosis Date   Alcohol abuse    Alcoholic pancreatitis 8242   admission   Chronic back pain    Chronic pancreatitis ([place])    based on ct findings 2016   COPD (chronic obstructive pulmonary disease) ([place])    Diabetes mellitus    type 2   Diverticulosis    Gastritis    GERD (gastroesophageal reflux disease)    Headache    History of radiation therapy 05/12/17- 06/22/17   Left cheek and bilateral neck/ 60 Gy in 30 fractions to gross disease   Hyperlipidemia    Hypertension    Jaw cancer ([place])    left jaw part of jaw bone removed   Peptic ulcer disease 1999   Per medical reports, no H pylori   Pneumonia    Renal cancer, left ([place]) 2012   he tells me that he has been released, ?and that he is free of cancer, and never had it to begin with.    Right shoulder pain    Testicular hypofunction    Past Surgical History:  Procedure Laterality Date   BALLOON DILATION  12/31/2021   Procedure: BALLOON DILATION;  Surgeon: [name], MD;  [facility];  Service: Endoscopy;;   BIOPSY  04/30/2022   Procedure: BIOPSY;  Surgeon: Maryjane Hurter,  MD;  [facility]: [facility];  Service: Pulmonary;;   CAROTID STENT  06/2018   COLONOSCOPY  2003   Dr. [name], polyps   COLONOSCOPY  2005   Dr. [name], multiple diverticula   COLONOSCOPY  2008   Dr. [name], diverticulosis   COLONOSCOPY WITH PROPOFOL N/A 10/21/2016   Dr. [name]: Diverticulosis, two 5-7 mm polyps removed. path-tubular adenomas.  Next colonoscopy in 5 years.   COLONOSCOPY WITH PROPOFOL N/A 12/31/2021   Procedure: COLONOSCOPY WITH PROPOFOL;  Surgeon: [name], MD;  [facility];  Service: Endoscopy;  Laterality: N/A;  8:15am   ENDOBRONCHIAL ULTRASOUND Right 04/30/2022   Procedure: ENDOBRONCHIAL ULTRASOUND;  Surgeon: Maryjane Hurter, MD;  [facility];  Service: Pulmonary;  Laterality: Right;   ESOPHAGOGASTRODUODENOSCOPY     Multiple EGDs. 1999 EGD showed gastric  ulcers, no H pylori and benign biopsies performed by Dr. [name]. 2001 gastric ulcer healed. Last EGD 2005 had gastritis.   ESOPHAGOGASTRODUODENOSCOPY (EGD) WITH PROPOFOL N/A 12/31/2021   Procedure: ESOPHAGOGASTRODUODENOSCOPY (EGD) WITH PROPOFOL;  Surgeon: [name], MD;  [facility];  Service: Endoscopy;  Laterality: N/A;   FINE NEEDLE ASPIRATION  04/30/2022   Procedure: FINE NEEDLE ASPIRATION (FNA) LINEAR;  Surgeon: Maryjane Hurter, MD;  [facility];  Service: Pulmonary;;   IR GASTROSTOMY TUBE REMOVAL  02/08/2020   IR IMAGING GUIDED PORT INSERTION  05/26/2022   IR REPLACE G-TUBE SIMPLE WO FLUORO  03/20/2019   IR REPLACE G-TUBE SIMPLE WO FLUORO  09/21/2019   IR REPLC GASTRO/COLONIC TUBE PERCUT W/FLUORO  05/16/2018   Left partial mandibulectomy, Scapular free flap reconstruction, selective neck dissection, tracheotomy, and resection of intraoral palate cancer. Left 03/01/2017   [facility]   LUNG BIOPSY     MOUTH SURGERY     PARTIAL NEPHRECTOMY Left 2012   POLYPECTOMY  10/21/2016   Procedure: POLYPECTOMY;  Surgeon: [name], MD;  [facility];  Service: Endoscopy;;  hepatic flexure x2   POLYPECTOMY  12/31/2021   Procedure: POLYPECTOMY INTESTINAL;  Surgeon: [name], MD;  [facility];  Service: Endoscopy;;   SURGERY OF LIP  06/2019   lip revision   TRANSURETHRAL RESECTION OF PROSTATE N/A 03/01/2019   Procedure: TRANSURETHRAL RESECTION OF THE PROSTATE (TURP)WITH CYSTOSCOPY;  Surgeon: Raynelle Bring, MD;  [facility];  Service: Urology;  Laterality: N/A;   VIDEO BRONCHOSCOPY Right 04/30/2022   Procedure: VIDEO BRONCHOSCOPY WITHOUT FLUORO;  Surgeon: Maryjane Hurter, MD;  [facility];  Service: Pulmonary;  Laterality: Right;   Family History  Problem Relation Age of Onset   Hypertension Mother    Colon cancer Neg Hx    Social History   Socioeconomic History   Marital status: Divorced     Spouse name: Not on file   Number of children: 0   Years of education: Not on file   Highest education level: Not on file  Occupational History   Not on file  Tobacco Use   Smoking status: Former    Packs/day: 0.50    Years: 35.00    Total pack years: 17.50    Types: Cigarettes    Quit date: 03/01/2017    Years since quitting: 5.3   Smokeless tobacco: Never   Tobacco comments:    quit 02-2017  Vaping Use   Vaping Use: Never used  Substance and Sexual Activity   Alcohol use: No    Comment: quit 2000 but  relapse in 2016. no etoh since hospitalized 2016.    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   Patient with a history of smoking half pack a day for 35+ years. Patient currently only smoking a few cigarettes per day.   The patient has history of alcohol abuse. Patient quit in 2000 and then relapsed in 2016. Patient has not had any alcohol since 2016.   Social Determinants of Health   Financial Resource Strain: High Risk (06/03/2022)   Overall Financial Resource Strain (CARDIA)    Difficulty of Paying Living Expenses: Very hard  Food Insecurity: No Food Insecurity (07/27/2019)   Hunger Vital Sign    Worried About Running Out of Food in the Last Year: Never true    Ran Out of Food in the Last Year: Never true  Transportation Needs: No Transportation Needs (09/08/2018)   PRAPARE - Hydrologist (Medical): No    Lack of Transportation (Non-Medical): No  Physical Activity: Inactive (07/27/2019)   Exercise Vital Sign    Days of Exercise per Week: 0 days    Minutes of Exercise per Session: 0 min  Stress: No Stress Concern Present (07/27/2019)   [place]    Feeling of Stress : Not at all  Social Connections: Unknown (07/27/2019)   Social Connection and  Isolation Panel [NHANES]    Frequency of Communication with Friends and Family: More than three times a week    Frequency of Social Gatherings with Friends and Family: More than three times a week    Attends Religious Services: More than 4 times per year    Active Member of Clubs or Organizations: Yes    Attends Archivist Meetings: More than 4 times per year    Marital Status: Not on file   No Known Allergies  Medications  (Not in a hospital admission)    Vitals   Vitals:   07/13/22 0200 07/13/22 0244 07/13/22 0330 07/13/22 0400  BP: (!) 163/95  138/73 (!) 144/64  Pulse: 100  100 (!) 102  Resp: 17  20 (!) 21  Temp:  97.8 F (36.6 C)    TempSrc:  Oral    SpO2: 94%  92% 95%  Weight:      Height:         Body mass index is 25.52 kg/m.  Physical Exam   General: Laying comfortably in bed; in no acute distress.  HENT: Normal oropharynx and mucosa. Normal external appearance of ears and nose.  Neck: Supple, no pain or tenderness  CV: No JVD. No peripheral edema.  Pulmonary: Symmetric Chest rise. Normal respiratory effort.  Abdomen: Soft to touch, non-tender.  Ext: No cyanosis, edema, or deformity  Skin: No rash. Normal palpation of skin.   Musculoskeletal: Normal digits and nails by inspection. No clubbing.   Neurologic Examination  Mental status/Cognition: Alert, oriented to self, place, month and year, good attention.  Speech/language: significantly dysarthric speech, fluent, comprehension intact, object naming intact, repetition intact.  Cranial nerves:   CN II Pupils equal and reactive to light, no VF deficits    CN III,IV,VI EOM intact, no gaze preference or deviation, no nystagmus    CN V normal sensation in V1, V2, and V3 segments bilaterally    CN VII Significant  left upper and lower facial droop   CN VIII normal hearing to speech    CN IX & X normal palatal elevation, no uvular deviation    CN XI 5/5 head turn and 5/5 shoulder shrug bilaterally     CN XII midline tongue protrusion    Motor:  Muscle bulk: normal, tone normal, pronator drift none tremor none Mvmt Root Nerve  Muscle Right Left Comments  SA C5/6 Ax Deltoid 5 5   EF C5/6 Mc Biceps 5 5   EE C6/7/8 Rad Triceps 5 5   WF C6/7 Med FCR     WE C7/8 PIN ECU     F Ab C8/T1 U ADM/FDI 5 5   HF L1/2/3 Fem Illopsoas 5 5   KE L2/3/4 Fem Quad 5 5   DF L4/5 D Peron Tib Ant 5 5   PF S1/2 Tibial Grc/Sol 5 5    Reflexes:  Right Left Comments  Pectoralis      Biceps (C5/6) 2 2   Brachioradialis (C5/6) 2 2    Triceps (C6/7) 2 2    Patellar (L3/4) 2 2    Achilles (S1)      Hoffman      Plantar     Jaw jerk    Sensation:  Light touch Intact throughout   Pin prick    Temperature    Vibration   Proprioception    Coordination/Complex Motor:  - Finger to Nose intact BL - Heel to shin intact BL - Rapid alternating movement are normal - Gait: deferred for patient safety.  Labs   CBC:  Recent Labs  Lab 07/12/22 2200 07/13/22 0500  WBC 2.3* 1.8*  NEUTROABS 1.7 1.4*  HGB 11.2* 10.2*  HCT 36.4* 32.4*  MCV 83.3 82.0  PLT 214 259    Basic Metabolic Panel:  Lab Results  Component Value Date   NA 131 (L) 07/12/2022   K 5.6 (H) 07/12/2022   CO2 24 07/12/2022   GLUCOSE 234 (H) 07/12/2022   BUN 46 (H) 07/12/2022   CREATININE 2.20 (H) 07/12/2022   CALCIUM 9.5 07/12/2022   GFRNONAA 31 (L) 07/12/2022   GFRAA 50 (L) 11/05/2019    CT Head without contrast(Personally reviewed): CTH was negative for a large hypodensity concerning for a large territory infarct or hyperdensity concerning for an ICH  MRI Brain: pending  rEEG:  pending  Impression   Colin Wagner is a 71 y.o. 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 No prior hx of similar  episodes. In the setting of known cancer, concerning for potential mets causing seizures.  Would recommend MRI Brain with and without contrast along with routine EEG. Hopefully, these would be able to provide sufficient answers. However, if these are non revealing, may consider transferring to [facility] for spell characterization.  Did consider potential orthostasis causing these episodes, specially given poor po intake. However, would expect him to not have prolonged post ictal period and have good recollection of the epsiode.   Recommendations  - MRI Brain with and without contrast. - routine EEG. - Keppra 2000mg  IV once. Hold off on maintenance Keppra at this time. - seizure precautions. - orthostatic vitals x 1. - if above is non revealing, may need to consider transfer to [facility] for spell capture.  ______________________________________________________________________   Thank you for the opportunity to take part in the care of this patient. If you have any further questions, please contact the neurology consultation attending.  Signed,  [place] Pager Number [phone] _ _ _   _ __   _ __ _ _  __ __   _ __   __ _

## 2022-07-14 ENCOUNTER — Other Ambulatory Visit (HOSPITAL_COMMUNITY): Payer: Self-pay

## 2022-07-14 ENCOUNTER — Encounter: Payer: Self-pay | Admitting: Internal Medicine

## 2022-07-14 DIAGNOSIS — K219 Gastro-esophageal reflux disease without esophagitis: Secondary | ICD-10-CM

## 2022-07-14 DIAGNOSIS — D72819 Decreased white blood cell count, unspecified: Secondary | ICD-10-CM

## 2022-07-14 DIAGNOSIS — E875 Hyperkalemia: Secondary | ICD-10-CM | POA: Diagnosis not present

## 2022-07-14 DIAGNOSIS — Z794 Long term (current) use of insulin: Secondary | ICD-10-CM

## 2022-07-14 DIAGNOSIS — I10 Essential (primary) hypertension: Secondary | ICD-10-CM | POA: Diagnosis not present

## 2022-07-14 DIAGNOSIS — R569 Unspecified convulsions: Secondary | ICD-10-CM | POA: Diagnosis not present

## 2022-07-14 DIAGNOSIS — E441 Mild protein-calorie malnutrition: Secondary | ICD-10-CM

## 2022-07-14 DIAGNOSIS — E785 Hyperlipidemia, unspecified: Secondary | ICD-10-CM

## 2022-07-14 DIAGNOSIS — E11319 Type 2 diabetes mellitus with unspecified diabetic retinopathy without macular edema: Secondary | ICD-10-CM

## 2022-07-14 LAB — GLUCOSE, CAPILLARY
Glucose-Capillary: 143 mg/dL — ABNORMAL HIGH (ref 70–99)
Glucose-Capillary: 152 mg/dL — ABNORMAL HIGH (ref 70–99)
Glucose-Capillary: 152 mg/dL — ABNORMAL HIGH (ref 70–99)

## 2022-07-14 MED ORDER — LEVETIRACETAM 500 MG PO TABS
500.0000 mg | ORAL_TABLET | Freq: Two times a day (BID) | ORAL | 1 refills | Status: DC
  Filled 2022-07-14: qty 60, 30d supply, fill #0
  Filled 2022-08-18 (×2): qty 60, 30d supply, fill #1

## 2022-07-14 MED ORDER — METOCLOPRAMIDE HCL 10 MG PO TABS: 10.0000 mg | ORAL_TABLET | Freq: Four times a day (QID) | ORAL | Status: DC | PRN

## 2022-07-14 MED ORDER — SODIUM CHLORIDE 0.9 % IV SOLN: 12.5000 mg | Freq: Four times a day (QID) | INTRAVENOUS | Status: DC | PRN

## 2022-07-14 MED ORDER — CHLORHEXIDINE GLUCONATE CLOTH 2 % EX PADS
6.0000 | MEDICATED_PAD | Freq: Every day | CUTANEOUS | Status: DC
  Administered 2022-07-14: 6 via TOPICAL

## 2022-07-14 MED ORDER — LEVEMIR FLEXTOUCH 100 UNIT/ML ~~LOC~~ SOPN: 32.0000 [IU] | PEN_INJECTOR | Freq: Every evening | SUBCUTANEOUS | Status: DC

## 2022-07-14 MED ORDER — HEPARIN SOD (PORK) LOCK FLUSH 100 UNIT/ML IV SOLN
500.0000 [IU] | INTRAVENOUS | Status: AC | PRN
  Administered 2022-07-14: 500 [IU]
  Filled 2022-07-14: qty 5

## 2022-07-14 MED ORDER — FLUTICASONE PROPIONATE 50 MCG/ACT NA SUSP
2.0000 | Freq: Every day | NASAL | Status: DC
  Administered 2022-07-14: 2 via NASAL
  Filled 2022-07-14: qty 16

## 2022-07-14 MED ORDER — AMLODIPINE BESYLATE 10 MG PO TABS
10.0000 mg | ORAL_TABLET | Freq: Every day | ORAL | 1 refills | Status: DC
  Filled 2022-07-14: qty 30, 30d supply, fill #0

## 2022-07-14 MED ORDER — ORAL CARE MOUTH RINSE: 15.0000 mL | OROMUCOSAL | Status: DC | PRN

## 2022-07-14 MED ORDER — VITAMIN D 25 MCG (1000 UNIT) PO TABS
1000.0000 [IU] | ORAL_TABLET | Freq: Every day | ORAL | Status: DC
  Administered 2022-07-14: 1000 [IU] via ORAL
  Filled 2022-07-14: qty 1

## 2022-07-14 MED ORDER — LEVETIRACETAM 500 MG PO TABS
500.0000 mg | ORAL_TABLET | Freq: Two times a day (BID) | ORAL | Status: DC
  Administered 2022-07-14: 500 mg via ORAL
  Filled 2022-07-14: qty 1

## 2022-07-14 MED ORDER — PROCHLORPERAZINE MALEATE 10 MG PO TABS: 10.0000 mg | ORAL_TABLET | Freq: Four times a day (QID) | ORAL | Status: DC | PRN

## 2022-07-14 MED ORDER — SODIUM CHLORIDE 0.9 % IV SOLN: INTRAVENOUS | Status: DC

## 2022-07-14 MED ORDER — METOCLOPRAMIDE HCL 5 MG/ML IJ SOLN: 5.0000 mg | Freq: Four times a day (QID) | INTRAMUSCULAR | Status: DC | PRN

## 2022-07-14 MED ORDER — METOCLOPRAMIDE HCL 10 MG PO TABS
10.0000 mg | ORAL_TABLET | Freq: Four times a day (QID) | ORAL | 1 refills | Status: DC | PRN
  Filled 2022-07-14: qty 20, 5d supply, fill #0

## 2022-07-14 MED ORDER — AMLODIPINE BESYLATE 10 MG PO TABS: 10.0000 mg | ORAL_TABLET | Freq: Every day | ORAL | Status: DC

## 2022-07-14 NOTE — Progress Notes (Signed)
Mobility Specialist - Progress Note   07/14/22 1513  Mobility  Activity Ambulated with assistance in hallway  Level of Assistance Independent after set-up  Assistive Device  (IV Pole)  Distance Ambulated (ft) 250 ft  Activity Response Tolerated well  Mobility Referral Yes  $Mobility charge 1 Mobility   Pt received in bed and agreeable to mobility. No complaints during mobility. Mobility session cut short due to high HR up tp 121bpm. Pt to bed after session with all needs met w/ family in room.  Pre-mobility: 117bpm HR  During mobility: 121bpm HR Post-mobility: 107bpm HR  Set designer

## 2022-07-14 NOTE — Progress Notes (Signed)
Subjective: No further events.   Exam: Vitals:   07/13/22 2357 07/14/22 0555  BP: (!) 157/86 (!) 154/86  Pulse: (!) 101 (!) 105  Resp: 18 15  Temp: 97.8 F (36.6 C) 98.4 F (36.9 C)  SpO2: 97% 94%    Neuro: Patient is awake, oriented and conversant, moves all extremities well, visual fields are full  Impression: 71 year old male with recurrent episodes of generalized stiffening lasting for a minute to 2 minutes followed by postictal fatigue.  These have completely stopped since arrival to the hospital, interestingly corresponding to the time when he was loaded with Keppra.  I discussed with him that by description I did suspect that these were seizures, but cannot be definite.  I discussed the options of transfer to [facility] for continuous LTM monitoring, but given that he has not had any further episodes since admission, not sure if this would really be high yield.  The other option would be to start him on Keppra and if the episodes no longer occurred, then I would have a high suspicion that it was treatment effect and therefore they were seizures.  He and his wife discussed it, and would rather be discharged with trial of treatment and I think that this is a reasonable option.  If he were to continue to have episodes despite treatment, however, we may need to can reconsider further spell characterization.  Recommendations: 1) Keppra 500 mg twice daily, would give dose prior to discharge 2) follow-up with outpatient neurology.  Roland Rack, MD [facility] [phone]  If 7pm- 7am, please page neurology on call as listed in [place].

## 2022-07-14 NOTE — Discharge Summary (Signed)
Physician Discharge Summary  MANUAL NAVARRA ZJI:[id number] DOB: October 28, 1950 DOA: 07/12/2022  PCP: [name], MD  Admit date: 07/12/2022 Discharge date: 07/14/2022  Time spent: 55 minutes  Recommendations for Outpatient Follow-up:  Follow-up with [facility].  Office will call with appointment time.  Follow-up on seizure-like activity. Follow-up with [name], MD in 2 weeks.  On follow-up patient will need a basic metabolic profile done to follow-up on electrolytes and renal function.  Patient's blood pressure also need to be followed up on, as well as patient's diabetes.   Discharge Diagnoses:  Principal Problem:   Seizure-like activity ([place]) Active Problems:   DM type 2 (diabetes mellitus, type 2) (HCC)   Hyperlipidemia   Essential hypertension   Hyperkalemia   GERD (gastroesophageal reflux disease)   Primary malignant neoplasm of right upper lobe of lung (HCC)   Mild protein malnutrition (HCC)   Leukocytopenia   Normocytic anemia   Abnormal MRI of the head   Discharge Condition: Stable and improved.  Diet recommendation: Carb modified  Filed Weights   07/12/22 1830  Weight: 83 kg    History of present illness:  HPI per Dr. Jhonnie Garner is a 71 y.o. 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  The patient does not remember much of what happened. He denied fever, chills, rhinorrhea, sore throat, wheezing or hemoptysis.  No chest pain, palpitations, diaphoresis, PND, orthopnea or pitting edema of the lower extremities.  No abdominal pain, nausea, emesis,  diarrhea, constipation, melena or hematochezia.  No flank pain, dysuria, frequency or hematuria.  No polyuria, polydipsia or polyphagia.   ED course: Initial vital signs were temperature 98.5 F, pulse 113, respiration 20, BP 150/80 mmHg O2 sat 96% on room air.  The patient received 30 mL of Maalox along with 50 mL of viscous lidocaine, hydromorphone 1 mg IVP, hydromorphone 0.5 mg IVP, lorazepam 0.25 mg IVP, ondansetron 4 mg IVP, normal saline 1000 mL bolus and 2000 mg of Keppra.  I added Lokelma 10 g p.o. x1.   Lab work: CBC showed a white count of 2.3, hemoglobin 11.2 g/dL platelets 214.  He is urinalysis showed glucosuria of 150 and ketonuria 5 mg/dL.  Total CK and lactic acid were normal.  Coronavirus and influenza PCR negative.   Imaging: Three-way abdomen with no evidence of bowel obstruction or free air.  Stable masslike consolidation in the right upper lobe.  CT head with no acute intracranial normalities.  MRI brain with no acute intracranial abnormality.  No evidence of intracranial metastasis.  Mild generalized atrophy.  No specific seizure focus identified.  Some 1 bulbous appearance of the proximal basilar artery MRI or CT angiography of the head is recommended to exclude a Flutiform aneurysm at the site.  Paranasal sinus disease.  Large left middle ear/mastoid effusion.  Hospital Course:  #1 seizure-like activity -Patient admitted with concerns for seizure-like activity. -CT head done negative for any acute abnormalities noted. -Patient with no signs or symptoms of infection. -MRI brain done showed no evidence of acute intracranial abnormality, no evidence of intracranial metastatic disease, no specific seizure focus identified, mild generalized cerebral atrophy, some bulbous appearance of the proximal basilar artery.  MRI or CT  angiogram of the head recommended to exclude a fusiform aneurysm at the site.  Paranasal sinus disease.  Large left middle ear/mastoid effusion. -MRA of the brain  done with no signal noted in the proximal left vertebral artery, some signal seen in what may be a diminutive distal left V4 which may reflect retrograde flow.  Mild stenosis at the origins of the bilateral posterior communicating arteries, with fetal origin of right PCA near fetal origin left PCA.  No other significant stenosis or large vessel occlusion in the anterior circulation noted. -Patient received a Keppra loading dose on admission and had no further seizures during the hospitalization. -Patient seen in consultation by neurology who had recommended a EEG for further evaluation. -EEG obtained was negative for any seizure-like activity. -Patient remained seizure-free throughout the hospitalization, reassessed by neurology on day of discharge and recommended to start patient on Keppra 500 mg twice daily with a high suspicion that patient may have had seizures.  Neurology recommended outpatient neurological evaluation and no further inpatient evaluation needed at this time. -Patient be discharged in stable and improved condition.  2.  Diabetes mellitus type 2 -Remained stable.  3.  Hyperlipidemia -Patient maintained on home regimen statin.  4.  Hypertension -Patient maintained on HCTZ 12.5 mg daily, home dose Norvasc increased to 10 mg daily and lisinopril discontinued as patient had presented with hyperkalemia. -Outpatient follow-up with PCP.  5.  Hyperkalemia -Patient received dose of Lokelma and hyperkalemia had resolved by day of discharge. -Patient's ACE inhibitor was held and will be discontinued on discharge.  6.  GERD -Patient maintained on PPI.  7.  Primary malignant neoplasm right upper lobe -Outpatient follow-up with primary oncologist as previously scheduled.  8.  Mild protein calorie malnutrition -Patient maintained on nutritional supplementation.  58.  Leukopenia/anemia -In the setting of malignancy. -Outpatient follow-up.  Procedures: EEG 07/13/2022 CT head  07/12/2022 Acute abdominal series 07/12/2022 MRI brain 07/13/2022 MRA head 07/13/2022  Consultations: Neurology: Dr.Khaliqdina 07/13/2022  Discharge Exam: Vitals:   07/14/22 0555 07/14/22 1442  BP: (!) 154/86 125/71  Pulse: (!) 105 (!) 106  Resp: 15 18  Temp: 98.4 F (36.9 C) 98.1 F (36.7 C)  SpO2: 94% 97%    General: NAD. Cardiovascular: RRR no murmurs rubs or gallops.  No JVD.  No lower extremity edema. Respiratory: Clear to auscultation bilaterally.  No wheezes, no crackles, no rhonchi.  Fair air movement.  Speaking in full sentences  Discharge Instructions   Discharge Instructions     Ambulatory referral to Neurology   Complete by: As directed    An appointment is requested in approximately: 2 weeks   Diet Carb Modified   Complete by: As directed    Increase activity slowly   Complete by: As directed       Allergies as of 07/14/2022   No Known Allergies      Medication List     STOP taking these medications    fluconazole 150 MG tablet Commonly known as: Diflucan   lidocaine 2 % solution Commonly known as: XYLOCAINE   lisinopril 5 MG tablet Commonly known as: ZESTRIL       TAKE these medications    acetaminophen 500 MG tablet Commonly known as: TYLENOL Take 1,000 mg by mouth every 6 (six) hours as needed for mild pain or moderate pain.   albuterol 108 (90 Base) MCG/ACT inhaler Commonly known as: VENTOLIN HFA Inhale 2 puffs into the lungs every 4 (four) hours as needed for wheezing or shortness  of breath.   amLODipine 10 MG tablet Commonly known as: NORVASC Take 1 tablet (10 mg total) by mouth daily. What changed:  medication strength how much to take   Anoro Ellipta 62.5-25 MCG/ACT Aepb Generic drug: umeclidinium-vilanterol Inhale 1 puff into the lungs daily.   aspirin EC 81 MG tablet Take 81 mg by mouth daily.   atorvastatin 40 MG tablet Commonly known as: LIPITOR Take 40 mg by mouth daily.   cholecalciferol 25 MCG (1000  UNIT) tablet Commonly known as: VITAMIN D3 Take 1,000 Units by mouth daily.   clopidogrel 75 MG tablet Commonly known as: PLAVIX Take 75 mg by mouth daily.   cyclobenzaprine 10 MG tablet Commonly known as: FLEXERIL Take 10 mg by mouth 4 (four) times daily as needed for muscle spasms.   ferrous sulfate 325 (65 FE) MG tablet Take 325 mg by mouth daily with breakfast.   fluticasone 50 MCG/ACT nasal spray Commonly known as: FLONASE Place 2 sprays into both nostrils daily.   GARLIC PO Take 1 tablet by mouth daily.   glipiZIDE 2.5 MG 24 hr tablet Commonly known as: GLUCOTROL XL Take 2.5 mg by mouth every morning.   hydrochlorothiazide 12.5 MG tablet Commonly known as: HYDRODIURIL Take 12.5 mg by mouth daily.   HYDROcodone-acetaminophen 7.5-325 MG tablet Commonly known as: NORCO Take 1 tablet by mouth every 6 (six) hours as needed for moderate pain.   Levemir FlexTouch 100 UNIT/ML FlexPen Generic drug: insulin detemir Inject 32 Units into the skin every evening.   levETIRAcetam 500 MG tablet Commonly known as: KEPPRA Take 1 tablet (500 mg total) by mouth 2 (two) times daily.   lidocaine-prilocaine cream Commonly known as: EMLA Apply to the Port-A-Cath site 30-60 minutes before treatment. What changed:  how much to take how to take this when to take this   LORazepam 1 MG tablet Commonly known as: ATIVAN Take 1 mg by mouth every 6 (six) hours as needed for anxiety or sleep.   metoCLOPramide 10 MG tablet Commonly known as: REGLAN Take 1 tablet (10 mg total) by mouth every 6 (six) hours as needed (Hiccups.).   nystatin cream Commonly known as: MYCOSTATIN Apply to affected area 2 times daily   pantoprazole 40 MG tablet Commonly known as: PROTONIX Take 1 tablet (40 mg total) by mouth 2 (two) times daily before a meal.   prochlorperazine 10 MG tablet Commonly known as: COMPAZINE Take 1 tablet (10 mg total) by mouth every 6 (six) hours as needed for nausea or  vomiting.   senna 8.6 MG Tabs tablet Commonly known as: SENOKOT Take 1 tablet (8.6 mg total) by mouth at bedtime as needed for mild constipation. May be needed as pain medication can constipate.   sucralfate 1 g tablet Commonly known as: Carafate Take 1 tablet (1 g total) by mouth 3 (three) times daily as needed. May dissolve 1 tab and glass of water and drink as needed   tadalafil 20 MG tablet Commonly known as: CIALIS Take 1 tablet (20 mg total) by mouth daily as needed for erectile dysfunction. Take 30-60 minutes prior to intercourse   testosterone cypionate 200 MG/ML injection Commonly known as: DEPOTESTOSTERONE CYPIONATE Inject 1.5 mLs into the muscle every 14 (fourteen) days.   tiZANidine 4 MG tablet Commonly known as: ZANAFLEX Take 4 mg by mouth every 6 (six) hours as needed for muscle spasms.   traZODone 50 MG tablet Commonly known as: DESYREL Take 50 mg by mouth at bedtime.  No Known Allergies  Follow-up Information     [name], MD. Schedule an appointment as soon as possible for a visit in 2 week(s).   Specialty: Family Medicine Contact information: [place] [address] [phone]         [facility] Follow up.   Why: Outpatient follow-up with neurology.  Office will call with appoint meantime. Contact information: [address]     [place] [id number] [phone]                 The results of significant diagnostics from this hospitalization (including imaging, microbiology, ancillary and laboratory) are listed below for reference.    Significant Diagnostic Studies: MR ANGIO HEAD WO CONTRAST  Result Date: 07/14/2022 CLINICAL DATA:  MRA recommended due to bulbous appearance of the proximal basilar artery EXAM: MRA HEAD WITHOUT CONTRAST TECHNIQUE: Angiographic images of the Circle of Willis were acquired using MRA technique without intravenous contrast.  COMPARISON:  No prior MRA, correlation is made with MRI head 07/13/2022 FINDINGS: Evaluation is somewhat limited by susceptibility artifact affecting the left suboccipital region. No restricted diffusion to suggest acute or subacute infarct. Anterior circulation: Both internal carotid arteries are patent to the termini, without significant stenosis. A1 segments patent. Normal anterior communicating artery. Anterior cerebral arteries are patent to their distal aspects. No M1 stenosis or occlusion. Normal MCA bifurcations. Distal MCA branches perfused and symmetric. Posterior circulation: No signal is seen in the proximal left vertebral artery, with some signal seen in what may be a diminutive distal left V4 (series 7, image 56), which may reflect minimal retrograde flow. The right vertebral artery is dominant and patent the vertebrobasilar junction without stenosis. Basilar patent to its distal aspect. Superior cerebellar arteries patent bilaterally. Fetal origin of the right posterior cerebral artery from the right posterior communicating artery, with mild stenosis at the posterior communicating artery origin (series 7, image 122 and near the junction with P2 (series 7, image 129). Patent and diminutive left P1, with near fetal origin of the left PCA from a prominent left posterior communicating artery. There is also likely mild stenosis at the origin of the left posterior communicating artery (series 7, image 123). Multifocal mild stenosis in the bilateral PCAs, which are otherwise patent to their distal aspects. Anatomic variants: Fetal origin of the right PCA and near fetal origin of the left PCA IMPRESSION: 1. No signal is seen in the proximal left vertebral artery, with some signal seen in what may be a diminutive distal left V4, which may reflect retrograde flow. This could be further evaluated with CTA head and neck, if clinically indicated. 2. Mild stenosis at the origins of the bilateral posterior  communicating arteries, with fetal origin of the right PCA and near fetal origin the left PCA. 3. No other significant stenosis or large vessel occlusion in the anterior circulation. These results will be called to the ordering clinician or representative by the Radiologist Assistant, and communication documented in the PACS or [employer]. Electronically Signed   By: Merilyn Baba M.D.   On: 07/14/2022 00:02   EEG adult  Result Date: 07/13/2022 Lora Havens, MD     07/13/2022  7:57 PM Patient Name: KORDELL JAFRI MRN: [id number] Epilepsy Attending: Lora Havens Referring Physician/Provider: Reubin Milan, MD Date: 07/13/2022 Duration: 22.36 mins Patient history: 71 y.o. 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 EEG to evaluate for seizure Level of alertness: Awake AEDs during EEG study: None Technical aspects: This EEG study was done with scalp electrodes positioned according to the 10-20 International system of electrode placement. Electrical activity was reviewed with band pass filter of 1-70Hz , sensitivity of 7 uV/mm, display speed of 65mm/sec with a 60Hz  notched filter applied as appropriate. EEG data were recorded continuously and digitally stored.  Video monitoring was available and reviewed as appropriate. Description: The posterior dominant rhythm consists of 8-9Hz  activity of moderate voltage (25-35 uV) seen predominantly in posterior head regions, symmetric and reactive to eye opening and eye closing.  Hyperventilation and photic stimulation were not performed.   IMPRESSION: This study is within normal limits. No seizures or epileptiform discharges were seen throughout the recording. A normal interictal EEG does not exclude the diagnosis of epilepsy. Lora Havens   MR Brain W and Wo Contrast  Result Date: 07/13/2022 CLINICAL DATA:  Provided history: Seizure, new onset, no history of trauma. Additional history provided: History of squamous cell cancer of jaw, non-small cell lung cancer. EXAM: MRI HEAD WITHOUT AND WITH CONTRAST TECHNIQUE: Multiplanar, multiecho pulse sequences of the brain and surrounding structures were obtained without and with intravenous contrast. CONTRAST:  8 mL Gadavist intravenous contrast. COMPARISON:  Head CT 07/12/2022.  Brain MRI 05/14/2022. FINDINGS: Mild intermittent motion degradation. Brain: Mild generalized cerebral atrophy. No cortical encephalomalacia is identified. No significant cerebral white matter disease for age. No appreciable hippocampal size or signal asymmetry. There is no acute infarct. No evidence of an intracranial mass. No chronic intracranial blood products. No extra-axial fluid collection. No midline shift. No pathologic intracranial enhancement identified. Vascular: Redemonstrated developmental venous anomaly within the anterolateral left frontal lobe. Somewhat bulbous appearance of the proximal basilar artery (instance as seen on series 17, image 15) (series 8, image 5). Skull and upper cervical spine: No focal suspicious marrow lesion. Sinuses/Orbits: No mass or acute finding within the imaged orbits. Chronic medially displaced fracture deformity of the right lamina papyracea. Mild-to-moderate mucosal thickening within the left maxillary sinus. Mucosal thickening within the bilateral ethmoid air cells (mild-to-moderate right, minimal left). Other: Large left middle ear/mastoid effusion. Incompletely assessed postsurgical changes to the left mandible. IMPRESSION: 1. Mildly motion degraded exam. 2. No evidence of acute intracranial abnormality. 3. No evidence of intracranial metastatic disease. 4. No specific seizure focus is identified. 5. Mild generalized cerebral atrophy. 6. Somewhat bulbous appearance of the  proximal basilar artery. MR or CT angiography of the head is recommended to exclude a fusiform aneurysm at this site. 7. Paranasal sinus disease, as described. 8. Large left middle ear/mastoid effusion. Electronically Signed   By: Kellie Simmering D.O.   On: 07/13/2022 08:56   CT Head Wo Contrast  Result Date: 07/12/2022 CLINICAL DATA:  Seizure, new-onset, no history of trauma EXAM: CT HEAD WITHOUT CONTRAST TECHNIQUE: Contiguous axial images were obtained from the base of the skull through the vertex without intravenous contrast. RADIATION DOSE REDUCTION: This exam was performed according to the departmental dose-optimization program which includes automated exposure control, adjustment of the mA and/or kV according to patient size and/or use of iterative reconstruction technique. COMPARISON:  MRI head 05/14/2022 FINDINGS: Brain: Cerebral ventricle sizes are concordant with the degree of cerebral volume loss. No evidence of large-territorial acute infarction. No parenchymal hemorrhage. No mass lesion. No extra-axial  collection. No mass effect or midline shift. No hydrocephalus. Basilar cisterns are patent. Vascular: No hyperdense vessel. Skull: No acute fracture or focal lesion. Sinuses/Orbits: Paranasal sinuses and mastoid air cells are clear. The orbits are unremarkable. Other: None. IMPRESSION: No acute intracranial abnormality. Electronically Signed   By: Iven Finn M.D.   On: 07/12/2022 23:51   DG Abdomen Acute W/Chest  Result Date: 07/12/2022 CLINICAL DATA:  Distended abdomen, concern for bowel obstruction. EXAM: DG ABDOMEN ACUTE WITH 1 VIEW CHEST COMPARISON:  07/10/2022. FINDINGS: There is no evidence of dilated bowel loops or free intraperitoneal air. No radiopaque calculi or other acute radiographic abnormality is seen. Heart size and mediastinal contours are stable. There is redemonstration of a masslike consolidation in the right upper lobe at the paramediastinal border. No consolidation,  effusion, or pneumothorax. A stable chest port is noted on the left. Surgical clips are present in the cervical soft tissues and left axilla. IMPRESSION: 1. No evidence of bowel obstruction or free air. 2. Stable masslike consolidation in the right upper lobe. . Electronically Signed   By: Brett Fairy M.D.   On: 07/12/2022 23:29   CT Soft Tissue Neck Wo Contrast  Result Date: 07/11/2022 CLINICAL DATA:  Head and neck cancer. Sore throat. Recent chemotherapy and radiation. EXAM: CT NECK WITHOUT CONTRAST TECHNIQUE: Multidetector CT imaging of the neck was performed following the standard protocol without intravenous contrast. RADIATION DOSE REDUCTION: This exam was performed according to the departmental dose-optimization program which includes automated exposure control, adjustment of the mA and/or kV according to patient size and/or use of iterative reconstruction technique. COMPARISON:  04/19/2017 FINDINGS: Pharynx and larynx: Prior left maxillectomy with reconstruction of the left mandible and a left upper neck flap. There is no local residual or recurrent tumor. Pharyngeal structures are normal. Mild thickening of the epiglottis and circumferential thickening at the larynx is likely a sequela of radiation therapy. No retropharyngeal abnormality. Salivary glands: Normal right parotid gland and residual left parotid gland. Submandibular glands are absent. Thyroid: Normal Lymph nodes: Remote modified left neck dissection. Vascular: Stent in the right internal carotid artery. Atherosclerotic calcification of the proximal arch vessels and the aortic arch. Limited intracranial: Negative Visualized orbits: Normal Mastoids and visualized paranasal sinuses: Left mastoid and middle ear effusion. Paranasal sinuses are clear. Skeleton: Left mandible reconstruction and partial left maxillectomy. Upper chest: Incompletely visualized medial right upper effusion. Other: None. IMPRESSION: 1. Prior left maxillectomy with  reconstruction of the left mandible and a left upper neck flap. No local residual or recurrent tumor. 2. Mild thickening of the epiglottis and circumferential thickening of the larynx is likely a sequela of radiation therapy. 3. Left mastoid and middle ear effusion, also possibly due to radiation. 4. Incompletely visualized right pleural effusion. Aortic Atherosclerosis (ICD10-I70.0). Electronically Signed   By: Ulyses Jarred M.D.   On: 07/11/2022 00:23   CT Chest Wo Contrast  Result Date: 07/11/2022 CLINICAL DATA:  Non-small cell lung cancer.  Pneumonia. EXAM: CT CHEST WITHOUT CONTRAST TECHNIQUE: Multidetector CT imaging of the chest was performed following the standard protocol without IV contrast. RADIATION DOSE REDUCTION: This exam was performed according to the departmental dose-optimization program which includes automated exposure control, adjustment of the mA and/or kV according to patient size and/or use of iterative reconstruction technique. COMPARISON:  Chest x-ray same day. PET-CT 04/12/2022. Chest CT 03/2022. FINDINGS: Cardiovascular: No significant vascular findings. Normal heart size. No pericardial effusion. There are atherosclerotic calcifications of the aorta. Left chest port catheter tip ends  at the cavoatrial junction. Mediastinum/Nodes: There is new mild-to-moderate fluid distention of the entire esophagus with some diffuse esophageal wall thickening. The visualized thyroid gland is within normal limits. No enlarged lymph nodes are identified in the mediastinum or hilar regions allowing for lack of intravenous contrast. Lungs/Pleura: Again seen is right upper lobe bronchus cutoff with complete collapse of the right upper lobe. Masslike density throughout the right upper lobe is not significantly changed. Left lower lobe fissural nodule measuring 5 mm image 4/90 appears unchanged. There is some atelectasis in the lingula, unchanged. No new pulmonary nodules. No pleural effusion or  pneumothorax. Upper Abdomen: Left renal cyst is partially visualized. There are calcifications in the tail of the pancreas compatible with chronic pancreatitis, unchanged. Musculoskeletal: There are surgical changes in the left mandible. No acute fracture. No focal osseous lesion. IMPRESSION: 1. New fluid distention of the esophagus with diffuse esophageal wall thickening. Findings are worrisome for esophagitis. 2. Stable right upper lobe bronchus cutoff with complete collapse of the right upper lobe. 3. Stable 5 mm left lower lobe fissural nodule. No new pulmonary nodules. Aortic Atherosclerosis (ICD10-I70.0). Electronically Signed   By: Ronney Asters M.D.   On: 07/11/2022 00:08   DG Chest Port 1 View  Result Date: 07/10/2022 CLINICAL DATA:  Cough. History of cancer. EXAM: PORTABLE CHEST 1 VIEW COMPARISON:  Radiograph 04/30/2022. CT 03/30/2022 FINDINGS: Left chest port remains in place. Partial right upper lobe collapse with slightly improved aeration from prior radiograph. Background emphysema. There may be minimal ill-defined opacity in the left infrahilar region. No pneumothorax or pleural effusion. No pulmonary edema. The heart is normal in size. Stable mediastinal contours. Left neck and axillary surgical clips. IMPRESSION: 1. Questionable ill-defined opacity in the left infrahilar region, atelectasis versus pneumonia. 2. Partial right upper lobe collapse with slightly improved aeration from prior radiograph, known central right upper lobe mass. 3. Emphysema. Electronically Signed   By: Keith Rake M.D.   On: 07/10/2022 21:57    Microbiology: Recent Results (from the past 240 hour(s))  Resp Panel by RT-PCR (Flu A&B, Covid) Anterior Nasal Swab     Status: None   Collection Time: 07/10/22 10:15 PM   Specimen: Anterior Nasal Swab  Result Value Ref Range Status   SARS Coronavirus 2 by RT PCR NEGATIVE NEGATIVE Final    Comment: (NOTE) SARS-CoV-2 target nucleic acids are NOT DETECTED.  The  SARS-CoV-2 RNA is generally detectable in upper respiratory specimens during the acute phase of infection. The lowest concentration of SARS-CoV-2 viral copies this assay can detect is 138 copies/mL. A negative result does not preclude SARS-Cov-2 infection and should not be used as the sole basis for treatment or other patient management decisions. A negative result may occur with  improper specimen collection/handling, submission of specimen other than nasopharyngeal swab, presence of viral mutation(s) within the areas targeted by this assay, and inadequate number of viral copies(<138 copies/mL). A negative result must be combined with clinical observations, patient history, and epidemiological information. The expected result is Negative.  Fact Sheet for Patients:  EntrepreneurPulse.com.au  Fact Sheet for Healthcare Providers:  IncredibleEmployment.be  This test is no t yet approved or cleared by the [place] FDA and  has been authorized for detection and/or diagnosis of SARS-CoV-2 by FDA under an Emergency Use Authorization (EUA). This EUA will remain  in effect (meaning this test can be used) for the duration of the COVID-19 declaration under Section 564(b)(1) of the Act, 21 U.S.C.section 360bbb-3(b)(1), unless the authorization is  terminated  or revoked sooner.       Influenza A by PCR NEGATIVE NEGATIVE Final   Influenza B by PCR NEGATIVE NEGATIVE Final    Comment: (NOTE) The Xpert Xpress SARS-CoV-2/FLU/RSV plus assay is intended as an aid in the diagnosis of influenza from Nasopharyngeal swab specimens and should not be used as a sole basis for treatment. Nasal washings and aspirates are unacceptable for Xpert Xpress SARS-CoV-2/FLU/RSV testing.  Fact Sheet for Patients: EntrepreneurPulse.com.au  Fact Sheet for Healthcare Providers: IncredibleEmployment.be  This test is not yet approved or  cleared by the [place] FDA and has been authorized for detection and/or diagnosis of SARS-CoV-2 by FDA under an Emergency Use Authorization (EUA). This EUA will remain in effect (meaning this test can be used) for the duration of the COVID-19 declaration under Section 564(b)(1) of the Act, 21 U.S.C. section 360bbb-3(b)(1), unless the authorization is terminated or revoked.  Performed at [facility], [address], [place], [place] [address]   Resp Panel by RT-PCR (Flu A&B, Covid) Anterior Nasal Swab     Status: None   Collection Time: 07/13/22 12:03 AM   Specimen: Anterior Nasal Swab  Result Value Ref Range Status   SARS Coronavirus 2 by RT PCR NEGATIVE NEGATIVE Final    Comment: (NOTE) SARS-CoV-2 target nucleic acids are NOT DETECTED.  The SARS-CoV-2 RNA is generally detectable in upper respiratory specimens during the acute phase of infection. The lowest concentration of SARS-CoV-2 viral copies this assay can detect is 138 copies/mL. A negative result does not preclude SARS-Cov-2 infection and should not be used as the sole basis for treatment or other patient management decisions. A negative result may occur with  improper specimen collection/handling, submission of specimen other than nasopharyngeal swab, presence of viral mutation(s) within the areas targeted by this assay, and inadequate number of viral copies(<138 copies/mL). A negative result must be combined with clinical observations, patient history, and epidemiological information. The expected result is Negative.  Fact Sheet for Patients:  EntrepreneurPulse.com.au  Fact Sheet for Healthcare Providers:  IncredibleEmployment.be  This test is no t yet approved or cleared by the [place] FDA and  has been authorized for detection and/or diagnosis of SARS-CoV-2 by FDA under an Emergency Use Authorization (EUA). This EUA will remain  in effect (meaning this test can be  used) for the duration of the COVID-19 declaration under Section 564(b)(1) of the Act, 21 U.S.C.section 360bbb-3(b)(1), unless the authorization is terminated  or revoked sooner.       Influenza A by PCR NEGATIVE NEGATIVE Final   Influenza B by PCR NEGATIVE NEGATIVE Final    Comment: (NOTE) The Xpert Xpress SARS-CoV-2/FLU/RSV plus assay is intended as an aid in the diagnosis of influenza from Nasopharyngeal swab specimens and should not be used as a sole basis for treatment. Nasal washings and aspirates are unacceptable for Xpert Xpress SARS-CoV-2/FLU/RSV testing.  Fact Sheet for Patients: EntrepreneurPulse.com.au  Fact Sheet for Healthcare Providers: IncredibleEmployment.be  This test is not yet approved or cleared by the [place] FDA and has been authorized for detection and/or diagnosis of SARS-CoV-2 by FDA under an Emergency Use Authorization (EUA). This EUA will remain in effect (meaning this test can be used) for the duration of the COVID-19 declaration under Section 564(b)(1) of the Act, 21 U.S.C. section 360bbb-3(b)(1), unless the authorization is terminated or revoked.  Performed at [facility], [place] [address], [place],  [address]      Labs: Basic Metabolic Panel: Recent Labs  Lab 07/10/22 2224 07/12/22 2200 07/13/22 0611  NA 133* 131* 133*  K 4.5 5.6* 5.4*  CL 98 96* 99  CO2 26 24 26   GLUCOSE 215* 234* 202*  BUN 42* 46* 40*  CREATININE 1.77* 2.20* 1.73*  CALCIUM 9.2 9.5 8.9  MG  --   --  1.9   Liver Function Tests: Recent Labs  Lab 07/10/22 2224 07/12/22 2200 07/13/22 0611  AST 17 23 21   ALT 17 15 14   ALKPHOS 72 69 63  BILITOT 0.3 0.9 0.6  PROT 7.2 7.6 6.8  ALBUMIN 3.3* 3.5 3.2*   Recent Labs  Lab 07/10/22 2224  LIPASE 24   No results for input(s): "AMMONIA" in the last 168 hours. CBC: Recent Labs  Lab 07/10/22 2224 07/12/22 2200 07/13/22 0500  WBC 1.4* 2.3* 1.8*   NEUTROABS 1.1* 1.7 1.4*  HGB 11.0* 11.2* 10.2*  HCT 34.5* 36.4* 32.4*  MCV 81.0 83.3 82.0  PLT 153 214 156   Cardiac Enzymes: Recent Labs  Lab 07/13/22 0003  CKTOTAL 127   BNP: BNP (last 3 results) No results for input(s): "BNP" in the last 8760 hours.  ProBNP (last 3 results) No results for input(s): "PROBNP" in the last 8760 hours.  CBG: Recent Labs  Lab 07/13/22 1803 07/13/22 2058 07/14/22 0727 07/14/22 1122 07/14/22 1630  GLUCAP 172* 218* 143* 152* 152*       Signed:  Irine Seal MD.  [facility] 07/14/2022, 5:04 PM

## 2022-07-15 ENCOUNTER — Other Ambulatory Visit: Payer: Self-pay | Admitting: Physician Assistant

## 2022-07-15 DIAGNOSIS — Z5111 Encounter for antineoplastic chemotherapy: Secondary | ICD-10-CM

## 2022-07-15 DIAGNOSIS — C3411 Malignant neoplasm of upper lobe, right bronchus or lung: Secondary | ICD-10-CM

## 2022-07-19 ENCOUNTER — Encounter: Payer: Self-pay | Admitting: Internal Medicine

## 2022-07-19 ENCOUNTER — Encounter: Payer: Self-pay | Admitting: Neurology

## 2022-07-19 NOTE — Progress Notes (Signed)
                                                                                                                                                             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[place]) Staging form: Oral Cavity, AJCC 8th Edition - Clinical: Stage Unknown (cT2, cNX, cM0) - Signed by [name], MD on 01/05/2017  Primary malignant neoplasm of right upper lobe of lung [facility]) Staging form: Lung, AJCC 8th Edition - Clinical stage from 05/17/2022: Stage IIIC (cT4, cN3, cM0) - Signed by [name], MD on 05/17/2022   Intent: Curative  Radiation Treatment Dates: 05/26/2022 through 07/06/2022 Site Technique Total Dose (Gy) Dose per Fx (Gy) Completed Fx Beam Energies  Lung, Right: Lung_R_upper IMRT 60/60 2 30/30 6X   Narrative: The patient tolerated radiation therapy relatively well.   Plan: The patient will follow-up with radiation oncology in 21mo. -----------------------------------  [name], MD

## 2022-07-20 ENCOUNTER — Encounter: Payer: Self-pay | Admitting: Internal Medicine

## 2022-07-20 ENCOUNTER — Ambulatory Visit (INDEPENDENT_AMBULATORY_CARE_PROVIDER_SITE_OTHER): Payer: Medicare Other | Admitting: Internal Medicine

## 2022-07-20 ENCOUNTER — Telehealth: Payer: Self-pay | Admitting: *Deleted

## 2022-07-20 ENCOUNTER — Encounter: Payer: Self-pay | Admitting: *Deleted

## 2022-07-20 VITALS — BP 158/81 | HR 114 | Temp 98.1°F | Ht 71.0 in | Wt 171.6 lb

## 2022-07-20 DIAGNOSIS — K219 Gastro-esophageal reflux disease without esophagitis: Secondary | ICD-10-CM

## 2022-07-20 DIAGNOSIS — R1310 Dysphagia, unspecified: Secondary | ICD-10-CM | POA: Diagnosis not present

## 2022-07-20 NOTE — Telephone Encounter (Signed)
NEED ASAP!! Request for patient to stop medication prior to procedure or is needing cleareance  07/20/22  Colin Wagner August 30, 1950  What type of surgery is being performed?  Esophagogastroduodenoscopy (EGD) with esophageal dilation When is surgery scheduled? 07/26/22, Monday  What type of clearance is required (medical or pharmacy to hold medication or both? medication  Are there any medications that need to be held prior to surgery and how long? Plavix x 5 days  Name of physician performing surgery?  [facility] [employer]: [phone] Fax: [phone]  Anethesia type (none, local, MAC, general)? MAC

## 2022-07-20 NOTE — Telephone Encounter (Signed)
Faxed clearance to [facility] and [facility] to hold plavix x 5 days

## 2022-07-20 NOTE — Telephone Encounter (Signed)
[facility] to get clearance to hold Plavix for 5 days before EGD scheduled for 07/26/22 with Dr. [name]. Spoke to [place] and she took a message to send to clinical staff regarding this issues. Told her to have them fax over a letter stating ok to hold Plavix for 5 days with providers signature. She informed me that she would.

## 2022-07-20 NOTE — Patient Instructions (Signed)
It was good to see you again today!  It sounds like dilating her esophagus earlier in the year helped.  You now have a new problem with chest pain with swallowing as described.  Your recent chest CT shows your distal esophagus is abnormal.  Is not clear whether this is due to radiation and or chemotherapy.  You need to have a repeat upper endoscopy new  As discussed, we will schedule diagnostic EGD in the near future.  You should come off of Plavix 5 days prior to the procedure.  We will get permission from the prescribing physician.  We will arrange to have this done soon as possible.  ASA 3.  For now, continue Protonix 40 mg daily.  Hold Glucotrol day of procedure  Decrease Levemir to 20 units the night before your procedure

## 2022-07-20 NOTE — Telephone Encounter (Signed)
Spoke with [place] (friend of pt) and advised her that clearance to hold Plavix was approved. Pt needs to start holding Plavix tomorrow 07/21/22, also hold iron medication, do half dose of Levemir night prior to procedure and no diabetic medications morning of procedure. Pt can have regular food and drink up until 6 pm on Sunday 07/25/22, then needs to start clear liquid diet. Day of procedure can have clear liquids up until 10 am. Instructions printed and placed up front to come by and pick up. Verbalized understanding

## 2022-07-20 NOTE — Progress Notes (Signed)
Primary Care Physician:  [name], MD Primary Gastroenterologist:  Dr. [name]  Pre-Procedure History & Physical: HPI:  Colin Wagner is a 71 y.o. male with hx of head and neck cancer causing dysphagia earlier in the year.  He underwent a EGD and TTS balloon dilation of a stenotic area/bar in the area of the upper esophageal sphincter.  This was associated with marked improvement in his swallowing ability.  However, since and he is undergoing chemotherapy and radiation therapy for right upper lobe squamous cell carcinoma.  Over the past 8 weeks he has developed significant odynophagia and says he is not able to eat.  He describes epigastric pain which starts immediately after he swallows a bolus.  He stops eating and symptoms resolve with in  wn/ 10 to 15 minutes.  Recent ED visit resulted in a CT of the chest which revealed a markedly thickened, fluid-filled esophagus which was a new finding compared to prior cross-sectional imaging.  He has not had any melena or rectal bleeding or nausea and vomiting.  He has lost 16 pounds since he was weighed and here in the spring.  He continues taking Protonix 40 mg daily for GERD.  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    Right shoulder pain    Testicular hypofunction     Past Surgical History:  Procedure Laterality Date   BALLOON DILATION  12/31/2021   Procedure: BALLOON DILATION;  Surgeon: [name], MD;  [facility];  Service: Endoscopy;;   BIOPSY  04/30/2022   Procedure: BIOPSY;  Surgeon: Maryjane Hurter, MD;  [facility];  Service: Pulmonary;;   CAROTID STENT  06/2018   COLONOSCOPY  2003   Dr. [name], polyps   COLONOSCOPY  2005   Dr. [name], multiple diverticula   COLONOSCOPY  2008   Dr. [name], diverticulosis   COLONOSCOPY WITH PROPOFOL N/A 10/21/2016   Dr. [name]: Diverticulosis, two 5-7 mm polyps removed. path-tubular adenomas.  Next colonoscopy in 5 years.   COLONOSCOPY WITH PROPOFOL N/A 12/31/2021   Procedure: COLONOSCOPY WITH PROPOFOL;  Surgeon: [name], MD;  [facility];  Service: Endoscopy;  Laterality: N/A;  8:15am   ENDOBRONCHIAL ULTRASOUND Right 04/30/2022   Procedure: ENDOBRONCHIAL ULTRASOUND;  Surgeon: Maryjane Hurter, MD;  [facility];  Service: Pulmonary;  Laterality: Right;   ESOPHAGOGASTRODUODENOSCOPY     Multiple EGDs. 1999 EGD showed gastric ulcers, no H pylori and benign biopsies performed by Dr. [name]. 2001 gastric ulcer healed. Last  EGD 2005 had gastritis.   ESOPHAGOGASTRODUODENOSCOPY (EGD) WITH PROPOFOL N/A 12/31/2021   Procedure: ESOPHAGOGASTRODUODENOSCOPY (EGD) WITH PROPOFOL;  Surgeon: [name], MD;  [facility];  Service: Endoscopy;  Laterality: N/A;   FINE NEEDLE ASPIRATION  04/30/2022   Procedure: FINE NEEDLE ASPIRATION (FNA) LINEAR;  Surgeon: Maryjane Hurter, MD;  [facility];  Service: Pulmonary;;   IR GASTROSTOMY TUBE REMOVAL  02/08/2020   IR IMAGING GUIDED PORT INSERTION  05/26/2022   IR REPLACE G-TUBE SIMPLE WO FLUORO  03/20/2019   IR REPLACE G-TUBE SIMPLE WO FLUORO  09/21/2019   IR REPLC GASTRO/COLONIC TUBE PERCUT W/FLUORO  05/16/2018   Left partial  mandibulectomy, Scapular free flap reconstruction, selective neck dissection, tracheotomy, and resection of intraoral palate cancer. Left 03/01/2017   [facility]   LUNG BIOPSY     MOUTH SURGERY     PARTIAL NEPHRECTOMY Left 2012   POLYPECTOMY  10/21/2016   Procedure: POLYPECTOMY;  Surgeon: [name], MD;  [facility];  Service: Endoscopy;;  hepatic flexure x2   POLYPECTOMY  12/31/2021   Procedure: POLYPECTOMY INTESTINAL;  Surgeon: [name], MD;  [facility];  Service: Endoscopy;;   SURGERY OF LIP  06/2019   lip revision   TRANSURETHRAL RESECTION OF PROSTATE N/A 03/01/2019   Procedure: TRANSURETHRAL RESECTION OF THE PROSTATE (TURP)WITH CYSTOSCOPY;  Surgeon: Raynelle Bring, MD;  [facility];  Service: Urology;  Laterality: N/A;   VIDEO BRONCHOSCOPY Right 04/30/2022   Procedure: VIDEO BRONCHOSCOPY WITHOUT FLUORO;  Surgeon: Maryjane Hurter, MD;  [facility];  Service: Pulmonary;  Laterality: Right;    Prior to Admission medications   Medication Sig Start Date End Date Taking? Authorizing Provider  acetaminophen (TYLENOL) 500 MG tablet Take 1,000 mg by mouth every 6 (six) hours as needed for mild pain or moderate pain.   Yes [provider]  albuterol (VENTOLIN HFA) 108 (90 Base) MCG/ACT inhaler Inhale 2 puffs into the lungs every 4 (four) hours as needed for wheezing or shortness of breath. 03/29/22  Yes [provider]  amLODipine (NORVASC) 10 MG tablet Take 1 tablet (10 mg total) by mouth daily. 07/14/22  Yes Eugenie Filler, MD  aspirin EC 81 MG tablet Take 81 mg by mouth daily.   Yes [provider]  atorvastatin (LIPITOR) 40 MG tablet Take 40 mg by mouth daily. 02/14/17  Yes [provider]  cholecalciferol (VITAMIN D) 25 MCG (1000 UNIT) tablet Take 1,000 Units by mouth daily.   Yes [provider]  clopidogrel (PLAVIX) 75 MG tablet Take 75 mg by mouth daily. 05/27/19  Yes  [provider]  ferrous sulfate 325 (65 FE) MG tablet Take 325 mg by mouth daily with breakfast.   Yes [provider]  fluticasone (FLONASE) 50 MCG/ACT nasal spray Place 2 sprays into both nostrils daily.   Yes [provider]  GARLIC PO Take 1 tablet by mouth daily.   Yes [provider]  glipiZIDE (GLUCOTROL XL) 2.5 MG 24 hr tablet Take 2.5 mg by mouth every morning. 08/11/18  Yes [provider]  hydrochlorothiazide (HYDRODIURIL) 12.5 MG tablet Take 12.5 mg by mouth daily. 04/24/19  Yes [provider]  HYDROcodone-acetaminophen (NORCO) 7.5-325 MG tablet Take 1 tablet by mouth every 6 (six) hours as needed for moderate pain. 06/30/22  Yes [provider]  LEVEMIR FLEXTOUCH 100 UNIT/ML FlexTouch Pen Inject 32 Units into the skin every evening. 07/14/22  Yes Eugenie Filler, MD  levETIRAcetam (KEPPRA) 500 MG tablet Take 1 tablet (500 mg total) by mouth 2 (two) times daily. 07/14/22  Yes Eugenie Filler, MD  lidocaine-prilocaine (EMLA) cream Apply to the Port-A-Cath site 30-60 minutes before treatment. Patient taking differently: Apply 1 Application topically See admin instructions. Apply to the Port-A-Cath site 30-60 minutes before treatment. 05/20/22  Yes Curt Bears, MD  LORazepam (ATIVAN) 1 MG tablet Take 1 mg by mouth every 6 (six) hours as needed for anxiety or sleep. 03/31/15  Yes [provider]  metoCLOPramide (REGLAN) 10 MG tablet Take 1 tablet (10 mg total) by mouth every 6 (six) hours as needed (Hiccups.). 07/14/22 07/14/23 Yes Eugenie Filler, MD  nystatin cream (MYCOSTATIN) Apply to affected area 2 times daily 06/13/22  Yes Volney American, PA-C  pantoprazole (PROTONIX) 40 MG tablet Take 1 tablet (40 mg total) by mouth 2 (two) times daily before a meal. 04/06/22  Yes Lyrick Lagrand, Cristopher Estimable, MD  prochlorperazine (COMPAZINE) 10 MG tablet TAKE 1 TABLET(10 MG) BY MOUTH EVERY 6 HOURS AS NEEDED FOR NAUSEA OR  VOMITING 07/15/22  Yes Heilingoetter, Cassandra L, PA-C  senna (SENOKOT) 8.6 MG TABS tablet Take 1 tablet (8.6 mg total) by mouth at bedtime as needed for mild constipation. May be needed as pain medication can constipate. 01/12/17  Yes [name], MD  sucralfate (CARAFATE) 1 g tablet Take 1 tablet (1 g total) by mouth 3 (three) times daily as needed. May dissolve 1 tab and glass of water and drink as needed 06/13/22  Yes Volney American, PA-C  tadalafil (CIALIS) 20 MG tablet Take 1 tablet (20 mg total) by mouth daily as needed for erectile dysfunction. Take 30-60 minutes prior to intercourse 11/19/21  Yes Stoneking, Reece Leader., MD  testosterone cypionate (DEPOTESTOSTERONE CYPIONATE) 200 MG/ML injection Inject 1.5 mLs into the muscle every 14 (fourteen) days. 05/22/22  Yes [provider]  tiZANidine (ZANAFLEX) 4 MG tablet Take 4 mg by mouth every 6 (six) hours as needed for muscle spasms. 06/30/22  Yes [provider]  umeclidinium-vilanterol (ANORO ELLIPTA) 62.5-25 MCG/ACT AEPB Inhale 1 puff into the lungs daily. 05/19/22  Yes Spero Geralds, MD    Allergies as of 07/20/2022   (No Known Allergies)    Family History  Problem Relation Age of Onset   Hypertension Mother    Colon cancer Neg Hx     Social History   Socioeconomic History   Marital status: Divorced    Spouse name: Not on file   Number of children: 0   Years of education: Not on file   Highest education level: Not on file  Occupational History   Not on file  Tobacco Use   Smoking status: Former    Packs/day: 0.50    Years: 35.00    Total pack years: 17.50    Types: Cigarettes    Quit date: 03/01/2017    Years since quitting: 5.3   Smokeless tobacco: Never   Tobacco comments:    quit 02-2017  Vaping Use   Vaping Use: Never used  Substance and Sexual Activity   Alcohol use: No    Comment: quit 2000 but relapse in 2016. no etoh since hospitalized 2016.    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   Patient with a history  of smoking half pack a day for 35+ years. Patient currently only smoking a few cigarettes per day.   The patient has history of alcohol abuse. Patient quit in 2000 and then relapsed in 2016. Patient has not had any alcohol since 2016.   Social Determinants of Health   Financial Resource Strain: High Risk (06/03/2022)   Overall Financial Resource Strain (CARDIA)    Difficulty of Paying Living Expenses: Very hard  Food Insecurity: No Food Insecurity (07/13/2022)   Hunger Vital Sign    Worried About Running Out of Food in the Last Year: Never true    Ran Out of Food in the Last Year: Never true  Transportation Needs: No Transportation Needs (07/13/2022)   PRAPARE - Hydrologist (Medical): No    Lack of Transportation (Non-Medical): No  Physical Activity: Inactive (07/27/2019)   Exercise Vital Sign    Days of Exercise per Week: 0 days    Minutes of Exercise per Session: 0 min  Stress: No Stress Concern Present (07/27/2019)   [place]    Feeling of Stress : Not at all  Social Connections: Unknown (07/27/2019)   Social Connection and Isolation Panel [NHANES]    Frequency of Communication with Friends and Family: More than three times a week    Frequency of Social Gatherings with Friends and Family: More than three times a week    Attends Religious Services: More than 4 times per year    Active Member of [employer] or Organizations: Yes    Attends Archivist Meetings: More than 4 times per year    Marital Status: Not on file  Intimate Partner Violence: Not At Risk (07/13/2022)   Humiliation, Afraid, Rape, and Kick questionnaire    Fear of Current or Ex-Partner: No    Emotionally Abused: No    Physically Abused: No    Sexually  Abused: No    Review of Systems: See HPI, otherwise negative ROS  Physical Exam: BP (!) 158/81 (BP Location: Right Arm, Patient Position: Sitting, Cuff Size: Large)   Pulse (!) 114   Temp 98.1 F (36.7 C) (Oral)   Ht 5\' 11"  (1.803 m)   Wt 171 lb 9.6 oz (77.8 kg)   SpO2 95%   BMI 23.93 kg/m  General:   Alert, deformity in the oral musculature.  He has a speech impediment.  Pleasant and cooperative in NAD; companied by his fiance. Lungs:  Clear throughout to auscultation.   No wheezes, crackles, or rhonchi. No acute distress. Heart:  Regular rate and rhythm; no murmurs, clicks, rubs,  or gallops. Abdomen: Nondistended.  Prior PEG scar well-healed positive bowel sounds soft nontender.  In particular, no epigastric tenderness.   Pulses:  Normal pulses noted. Extremities:  Without clubbing or edema.  Impression/Plan: Pleasant 71 year old gentleman with a history of head and neck carcinoma status post XRT resulting in a proximal esophageal stenosis.  He underwent TTS balloon dilation earlier in the year with marked improvement in his ability to swallow.  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  Over the past 8 weeks he has developed pain on swallowing.  Basically, sounds like odynophagia.  He has pain to the point he is afraid to eat.  Symptoms occur almost immediately after swallowing a bolus.  If he stops, discomfort goes away in about 10 minutes.  Recent CT showed markedly abnormal tubular  esophagus as described. Symptoms evolve and resolve fairly rapidly-too fast for gut ischemia.  In this setting, need to be concerned about radiation induced injury.  Also, Candida esophagitis would remain in the differential.  Cannot rule out superimposed chemical/pill induced esophageal injury.  Needs an urgent EGD to this to sort out further.  Recommendations:  I have offered the patient a diagnostic EGD in the near future.  ASA  3. The risks, benefits, limitations, alternatives and imponderables have been reviewed with the patient. Potential for esophageal dilation, biopsy, etc. have also been reviewed.  Questions have been answered. All parties agreeable.   I would prefer for the patient to come off his Plavix 5 days prior to the procedure.  Will will seek out approval for this maneuver from the prescribing physician.  Continuing aspirin is okay.  Further recommendations to follow.   As discussed, we will schedule diagnostic EGD in the near future.  You should come off of Plavix 5 days prior to the procedure.  We will get permission from the prescribing physician.  We will arrange to have this done soon as possible.  ASA 3.  For now, continue Protonix 40 mg daily.  Hold Glucotrol day of procedure  Decrease Levemir to 20 units the night before your procedure  Notice: This dictation was prepared with Dragon dictation along with smaller phrase technology. Any transcriptional errors that result from this process are unintentional and may not be corrected upon review.

## 2022-07-20 NOTE — Telephone Encounter (Signed)
Received clearance for pt to hold Plavix for 5 days prior to procedure. Clearance scanned into pt's chart.

## 2022-07-21 ENCOUNTER — Other Ambulatory Visit: Payer: Self-pay | Admitting: *Deleted

## 2022-07-21 DIAGNOSIS — R1310 Dysphagia, unspecified: Secondary | ICD-10-CM

## 2022-07-22 ENCOUNTER — Telehealth: Payer: Self-pay | Admitting: *Deleted

## 2022-07-22 NOTE — Telephone Encounter (Signed)
Returned patient's sgo- Colin Wagner's phone call, spoke with Ms,. Horutz

## 2022-07-23 ENCOUNTER — Other Ambulatory Visit (HOSPITAL_COMMUNITY)
Admission: RE | Admit: 2022-07-23 | Discharge: 2022-07-23 | Disposition: A | Discharge status: Home / Self Care | Payer: Medicare Other | Source: Ambulatory Visit | Attending: Internal Medicine | Admitting: Internal Medicine

## 2022-07-23 DIAGNOSIS — I6502 Occlusion and stenosis of left vertebral artery: Secondary | ICD-10-CM | POA: Diagnosis not present

## 2022-07-23 DIAGNOSIS — R1314 Dysphagia, pharyngoesophageal phase: Secondary | ICD-10-CM | POA: Diagnosis not present

## 2022-07-23 DIAGNOSIS — N1832 Chronic kidney disease, stage 3b: Secondary | ICD-10-CM | POA: Diagnosis not present

## 2022-07-23 DIAGNOSIS — E162 Hypoglycemia, unspecified: Secondary | ICD-10-CM | POA: Diagnosis not present

## 2022-07-23 DIAGNOSIS — C060 Malignant neoplasm of cheek mucosa: Secondary | ICD-10-CM | POA: Diagnosis not present

## 2022-07-23 DIAGNOSIS — Z923 Personal history of irradiation: Secondary | ICD-10-CM | POA: Diagnosis not present

## 2022-07-23 DIAGNOSIS — Z794 Long term (current) use of insulin: Secondary | ICD-10-CM | POA: Diagnosis not present

## 2022-07-23 DIAGNOSIS — E11649 Type 2 diabetes mellitus with hypoglycemia without coma: Secondary | ICD-10-CM | POA: Diagnosis not present

## 2022-07-23 DIAGNOSIS — Z87891 Personal history of nicotine dependence: Secondary | ICD-10-CM | POA: Diagnosis not present

## 2022-07-23 DIAGNOSIS — K2101 Gastro-esophageal reflux disease with esophagitis, with bleeding: Secondary | ICD-10-CM | POA: Diagnosis not present

## 2022-07-23 DIAGNOSIS — K860 Alcohol-induced chronic pancreatitis: Secondary | ICD-10-CM | POA: Diagnosis not present

## 2022-07-23 DIAGNOSIS — Z905 Acquired absence of kidney: Secondary | ICD-10-CM | POA: Diagnosis not present

## 2022-07-23 DIAGNOSIS — E785 Hyperlipidemia, unspecified: Secondary | ICD-10-CM | POA: Diagnosis not present

## 2022-07-23 DIAGNOSIS — R627 Adult failure to thrive: Secondary | ICD-10-CM | POA: Diagnosis not present

## 2022-07-23 DIAGNOSIS — R1310 Dysphagia, unspecified: Secondary | ICD-10-CM | POA: Insufficient documentation

## 2022-07-23 DIAGNOSIS — C411 Malignant neoplasm of mandible: Secondary | ICD-10-CM | POA: Diagnosis not present

## 2022-07-23 DIAGNOSIS — D6489 Other specified anemias: Secondary | ICD-10-CM | POA: Diagnosis not present

## 2022-07-23 DIAGNOSIS — I129 Hypertensive chronic kidney disease with stage 1 through stage 4 chronic kidney disease, or unspecified chronic kidney disease: Secondary | ICD-10-CM | POA: Diagnosis not present

## 2022-07-23 DIAGNOSIS — E1122 Type 2 diabetes mellitus with diabetic chronic kidney disease: Secondary | ICD-10-CM | POA: Diagnosis not present

## 2022-07-23 DIAGNOSIS — G9341 Metabolic encephalopathy: Secondary | ICD-10-CM | POA: Diagnosis not present

## 2022-07-23 DIAGNOSIS — R569 Unspecified convulsions: Secondary | ICD-10-CM | POA: Diagnosis not present

## 2022-07-23 DIAGNOSIS — J449 Chronic obstructive pulmonary disease, unspecified: Secondary | ICD-10-CM | POA: Diagnosis not present

## 2022-07-23 DIAGNOSIS — Z7902 Long term (current) use of antithrombotics/antiplatelets: Secondary | ICD-10-CM | POA: Diagnosis not present

## 2022-07-23 DIAGNOSIS — C3411 Malignant neoplasm of upper lobe, right bronchus or lung: Secondary | ICD-10-CM | POA: Diagnosis not present

## 2022-07-23 DIAGNOSIS — Z1152 Encounter for screening for COVID-19: Secondary | ICD-10-CM | POA: Diagnosis not present

## 2022-07-23 DIAGNOSIS — Y842 Radiological procedure and radiotherapy as the cause of abnormal reaction of the patient, or of later complication, without mention of misadventure at the time of the procedure: Secondary | ICD-10-CM | POA: Diagnosis not present

## 2022-07-23 DIAGNOSIS — Z8249 Family history of ischemic heart disease and other diseases of the circulatory system: Secondary | ICD-10-CM | POA: Diagnosis not present

## 2022-07-23 LAB — BASIC METABOLIC PANEL
Anion gap: 12 (ref 5–15)
BUN: 33 mg/dL — ABNORMAL HIGH (ref 8–23)
CO2: 23 mmol/L (ref 22–32)
Calcium: 9.7 mg/dL (ref 8.9–10.3)
Chloride: 96 mmol/L — ABNORMAL LOW (ref 98–111)
Creatinine, Ser: 1.92 mg/dL — ABNORMAL HIGH (ref 0.61–1.24)
GFR, Estimated: 37 mL/min — ABNORMAL LOW (ref >60)
Glucose, Bld: 55 mg/dL — ABNORMAL LOW (ref 70–99)
Potassium: 4.4 mmol/L (ref 3.5–5.1)
Sodium: 131 mmol/L — ABNORMAL LOW (ref 135–145)

## 2022-07-25 ENCOUNTER — Inpatient Hospital Stay (HOSPITAL_COMMUNITY)
Admission: EM | Admit: 2022-07-25 | Discharge: 2022-07-27 | DRG: 637 | Disposition: A | Discharge status: Home / Self Care | Payer: Medicare Other | Attending: Family Medicine | Admitting: Family Medicine

## 2022-07-25 ENCOUNTER — Other Ambulatory Visit: Payer: Self-pay

## 2022-07-25 ENCOUNTER — Emergency Department (HOSPITAL_COMMUNITY): Payer: Medicare Other

## 2022-07-25 ENCOUNTER — Encounter (HOSPITAL_COMMUNITY): Payer: Self-pay | Admitting: Anesthesiology

## 2022-07-25 ENCOUNTER — Encounter (HOSPITAL_COMMUNITY): Payer: Self-pay

## 2022-07-25 DIAGNOSIS — E785 Hyperlipidemia, unspecified: Secondary | ICD-10-CM | POA: Diagnosis present

## 2022-07-25 DIAGNOSIS — Z87891 Personal history of nicotine dependence: Secondary | ICD-10-CM

## 2022-07-25 DIAGNOSIS — Z794 Long term (current) use of insulin: Secondary | ICD-10-CM

## 2022-07-25 DIAGNOSIS — J449 Chronic obstructive pulmonary disease, unspecified: Secondary | ICD-10-CM | POA: Diagnosis present

## 2022-07-25 DIAGNOSIS — T383X5A Adverse effect of insulin and oral hypoglycemic [antidiabetic] drugs, initial encounter: Secondary | ICD-10-CM | POA: Diagnosis present

## 2022-07-25 DIAGNOSIS — Z7984 Long term (current) use of oral hypoglycemic drugs: Secondary | ICD-10-CM

## 2022-07-25 DIAGNOSIS — I129 Hypertensive chronic kidney disease with stage 1 through stage 4 chronic kidney disease, or unspecified chronic kidney disease: Secondary | ICD-10-CM | POA: Diagnosis present

## 2022-07-25 DIAGNOSIS — Z8701 Personal history of pneumonia (recurrent): Secondary | ICD-10-CM

## 2022-07-25 DIAGNOSIS — I6502 Occlusion and stenosis of left vertebral artery: Secondary | ICD-10-CM | POA: Diagnosis present

## 2022-07-25 DIAGNOSIS — R1314 Dysphagia, pharyngoesophageal phase: Secondary | ICD-10-CM | POA: Diagnosis present

## 2022-07-25 DIAGNOSIS — G8929 Other chronic pain: Secondary | ICD-10-CM | POA: Diagnosis present

## 2022-07-25 DIAGNOSIS — R2990 Unspecified symptoms and signs involving the nervous system: Secondary | ICD-10-CM

## 2022-07-25 DIAGNOSIS — I10 Essential (primary) hypertension: Secondary | ICD-10-CM | POA: Diagnosis present

## 2022-07-25 DIAGNOSIS — K2101 Gastro-esophageal reflux disease with esophagitis, with bleeding: Secondary | ICD-10-CM | POA: Diagnosis present

## 2022-07-25 DIAGNOSIS — Z85528 Personal history of other malignant neoplasm of kidney: Secondary | ICD-10-CM

## 2022-07-25 DIAGNOSIS — Z9079 Acquired absence of other genital organ(s): Secondary | ICD-10-CM

## 2022-07-25 DIAGNOSIS — R634 Abnormal weight loss: Secondary | ICD-10-CM

## 2022-07-25 DIAGNOSIS — Y842 Radiological procedure and radiotherapy as the cause of abnormal reaction of the patient, or of later complication, without mention of misadventure at the time of the procedure: Secondary | ICD-10-CM | POA: Diagnosis present

## 2022-07-25 DIAGNOSIS — E162 Hypoglycemia, unspecified: Secondary | ICD-10-CM | POA: Diagnosis not present

## 2022-07-25 DIAGNOSIS — Z79899 Other long term (current) drug therapy: Secondary | ICD-10-CM

## 2022-07-25 DIAGNOSIS — N1832 Chronic kidney disease, stage 3b: Secondary | ICD-10-CM | POA: Diagnosis present

## 2022-07-25 DIAGNOSIS — E1122 Type 2 diabetes mellitus with diabetic chronic kidney disease: Secondary | ICD-10-CM | POA: Diagnosis present

## 2022-07-25 DIAGNOSIS — Z923 Personal history of irradiation: Secondary | ICD-10-CM

## 2022-07-25 DIAGNOSIS — Z905 Acquired absence of kidney: Secondary | ICD-10-CM

## 2022-07-25 DIAGNOSIS — Z9009 Acquired absence of other part of head and neck: Secondary | ICD-10-CM

## 2022-07-25 DIAGNOSIS — E11649 Type 2 diabetes mellitus with hypoglycemia without coma: Principal | ICD-10-CM | POA: Diagnosis present

## 2022-07-25 DIAGNOSIS — R1319 Other dysphagia: Secondary | ICD-10-CM

## 2022-07-25 DIAGNOSIS — Z8249 Family history of ischemic heart disease and other diseases of the circulatory system: Secondary | ICD-10-CM

## 2022-07-25 DIAGNOSIS — C060 Malignant neoplasm of cheek mucosa: Secondary | ICD-10-CM | POA: Diagnosis present

## 2022-07-25 DIAGNOSIS — K922 Gastrointestinal hemorrhage, unspecified: Secondary | ICD-10-CM | POA: Diagnosis present

## 2022-07-25 DIAGNOSIS — K5790 Diverticulosis of intestine, part unspecified, without perforation or abscess without bleeding: Secondary | ICD-10-CM | POA: Diagnosis present

## 2022-07-25 DIAGNOSIS — E119 Type 2 diabetes mellitus without complications: Secondary | ICD-10-CM

## 2022-07-25 DIAGNOSIS — Z1152 Encounter for screening for COVID-19: Secondary | ICD-10-CM

## 2022-07-25 DIAGNOSIS — Z7902 Long term (current) use of antithrombotics/antiplatelets: Secondary | ICD-10-CM

## 2022-07-25 DIAGNOSIS — Z7951 Long term (current) use of inhaled steroids: Secondary | ICD-10-CM

## 2022-07-25 DIAGNOSIS — C411 Malignant neoplasm of mandible: Secondary | ICD-10-CM | POA: Diagnosis present

## 2022-07-25 DIAGNOSIS — Z888 Allergy status to other drugs, medicaments and biological substances status: Secondary | ICD-10-CM

## 2022-07-25 DIAGNOSIS — M549 Dorsalgia, unspecified: Secondary | ICD-10-CM | POA: Diagnosis present

## 2022-07-25 DIAGNOSIS — K860 Alcohol-induced chronic pancreatitis: Secondary | ICD-10-CM | POA: Diagnosis present

## 2022-07-25 DIAGNOSIS — R569 Unspecified convulsions: Secondary | ICD-10-CM | POA: Diagnosis present

## 2022-07-25 DIAGNOSIS — D6489 Other specified anemias: Secondary | ICD-10-CM | POA: Diagnosis present

## 2022-07-25 DIAGNOSIS — Z7982 Long term (current) use of aspirin: Secondary | ICD-10-CM

## 2022-07-25 DIAGNOSIS — R627 Adult failure to thrive: Secondary | ICD-10-CM

## 2022-07-25 DIAGNOSIS — G9341 Metabolic encephalopathy: Secondary | ICD-10-CM | POA: Diagnosis present

## 2022-07-25 DIAGNOSIS — R000 Tachycardia, unspecified: Secondary | ICD-10-CM

## 2022-07-25 DIAGNOSIS — C3411 Malignant neoplasm of upper lobe, right bronchus or lung: Secondary | ICD-10-CM | POA: Diagnosis present

## 2022-07-25 LAB — URINALYSIS, ROUTINE W REFLEX MICROSCOPIC
Bacteria, UA: NONE SEEN
Bilirubin Urine: NEGATIVE
Glucose, UA: 150 mg/dL — AB
Ketones, ur: NEGATIVE mg/dL
Leukocytes,Ua: NEGATIVE
Nitrite: NEGATIVE
Protein, ur: 30 mg/dL — AB
Specific Gravity, Urine: 1.019 (ref 1.005–1.030)
pH: 6 (ref 5.0–8.0)

## 2022-07-25 LAB — CBC
HCT: 33.8 % — ABNORMAL LOW (ref 39.0–52.0)
Hemoglobin: 11.1 g/dL — ABNORMAL LOW (ref 13.0–17.0)
MCH: 26.7 pg (ref 26.0–34.0)
MCHC: 32.8 g/dL (ref 30.0–36.0)
MCV: 81.3 fL (ref 80.0–100.0)
Platelets: 398 10*3/uL (ref 150–400)
RBC: 4.16 MIL/uL — ABNORMAL LOW (ref 4.22–5.81)
RDW: 20 % — ABNORMAL HIGH (ref 11.5–15.5)
WBC: 15.6 10*3/uL — ABNORMAL HIGH (ref 4.0–10.5)
nRBC: 0 % (ref 0.0–0.2)

## 2022-07-25 LAB — CBG MONITORING, ED
Glucose-Capillary: 161 mg/dL — ABNORMAL HIGH (ref 70–99)
Glucose-Capillary: 50 mg/dL — ABNORMAL LOW (ref 70–99)
Glucose-Capillary: 53 mg/dL — ABNORMAL LOW (ref 70–99)
Glucose-Capillary: 103 mg/dL — ABNORMAL HIGH (ref 70–99)
Glucose-Capillary: 73 mg/dL (ref 70–99)
Glucose-Capillary: 45 mg/dL — ABNORMAL LOW (ref 70–99)
Glucose-Capillary: 181 mg/dL — ABNORMAL HIGH (ref 70–99)
Glucose-Capillary: 327 mg/dL — ABNORMAL HIGH (ref 70–99)

## 2022-07-25 LAB — APTT: aPTT: 29 seconds (ref 24–36)

## 2022-07-25 LAB — I-STAT CHEM 8, ED
BUN: 34 mg/dL — ABNORMAL HIGH (ref 8–23)
Calcium, Ion: 1.23 mmol/L (ref 1.15–1.40)
Chloride: 97 mmol/L — ABNORMAL LOW (ref 98–111)
Creatinine, Ser: 2.1 mg/dL — ABNORMAL HIGH (ref 0.61–1.24)
Glucose, Bld: 28 mg/dL — CL (ref 70–99)
HCT: 37 % — ABNORMAL LOW (ref 39.0–52.0)
Hemoglobin: 12.6 g/dL — ABNORMAL LOW (ref 13.0–17.0)
Potassium: 3.8 mmol/L (ref 3.5–5.1)
Sodium: 134 mmol/L — ABNORMAL LOW (ref 135–145)
TCO2: 27 mmol/L (ref 22–32)

## 2022-07-25 LAB — COMPREHENSIVE METABOLIC PANEL
ALT: 23 U/L (ref 0–44)
AST: 55 U/L — ABNORMAL HIGH (ref 15–41)
Albumin: 3.8 g/dL (ref 3.5–5.0)
Alkaline Phosphatase: 87 U/L (ref 38–126)
Anion gap: 11 (ref 5–15)
BUN: 40 mg/dL — ABNORMAL HIGH (ref 8–23)
CO2: 27 mmol/L (ref 22–32)
Calcium: 9.9 mg/dL (ref 8.9–10.3)
Chloride: 97 mmol/L — ABNORMAL LOW (ref 98–111)
Creatinine, Ser: 2.17 mg/dL — ABNORMAL HIGH (ref 0.61–1.24)
GFR, Estimated: 32 mL/min — ABNORMAL LOW (ref >60)
Glucose, Bld: 33 mg/dL — CL (ref 70–99)
Potassium: 4.1 mmol/L (ref 3.5–5.1)
Sodium: 135 mmol/L (ref 135–145)
Total Bilirubin: 0.1 mg/dL — ABNORMAL LOW (ref 0.3–1.2)
Total Protein: 8.2 g/dL — ABNORMAL HIGH (ref 6.5–8.1)

## 2022-07-25 LAB — DIFFERENTIAL
Abs Immature Granulocytes: 0.07 10*3/uL (ref 0.00–0.07)
Basophils Absolute: 0 10*3/uL (ref 0.0–0.1)
Basophils Relative: 0 %
Eosinophils Absolute: 0 10*3/uL (ref 0.0–0.5)
Eosinophils Relative: 0 %
Immature Granulocytes: 0 %
Lymphocytes Relative: 3 %
Lymphs Abs: 0.4 10*3/uL — ABNORMAL LOW (ref 0.7–4.0)
Monocytes Absolute: 1.2 10*3/uL — ABNORMAL HIGH (ref 0.1–1.0)
Monocytes Relative: 8 %
Neutro Abs: 13.8 10*3/uL — ABNORMAL HIGH (ref 1.7–7.7)
Neutrophils Relative %: 89 %

## 2022-07-25 LAB — RAPID URINE DRUG SCREEN, HOSP PERFORMED
Amphetamines: NOT DETECTED
Barbiturates: NOT DETECTED
Benzodiazepines: NOT DETECTED
Cocaine: NOT DETECTED
Opiates: POSITIVE — AB
Tetrahydrocannabinol: NOT DETECTED

## 2022-07-25 LAB — PROTIME-INR
INR: 1.2 (ref 0.8–1.2)
Prothrombin Time: 14.9 seconds (ref 11.4–15.2)

## 2022-07-25 LAB — GLUCOSE, CAPILLARY: Glucose-Capillary: 329 mg/dL — ABNORMAL HIGH (ref 70–99)

## 2022-07-25 LAB — RESP PANEL BY RT-PCR (FLU A&B, COVID) ARPGX2
Influenza A by PCR: NEGATIVE
Influenza B by PCR: NEGATIVE
SARS Coronavirus 2 by RT PCR: NEGATIVE

## 2022-07-25 LAB — ETHANOL: Alcohol, Ethyl (B): <10 mg/dL (ref <10)

## 2022-07-25 LAB — LACTIC ACID, PLASMA
Lactic Acid, Venous: 2.3 mmol/L (ref 0.5–1.9)
Lactic Acid, Venous: 0.9 mmol/L (ref 0.5–1.9)

## 2022-07-25 MED ORDER — DEXTROSE 50 % IV SOLN
1.0000 | Freq: Once | INTRAVENOUS | Status: AC
  Administered 2022-07-25: 50 mL via INTRAVENOUS
  Filled 2022-07-25: qty 50

## 2022-07-25 MED ORDER — FLUTICASONE PROPIONATE 50 MCG/ACT NA SUSP
2.0000 | Freq: Every day | NASAL | Status: DC
  Administered 2022-07-25: 2 via NASAL
  Filled 2022-07-25: qty 16

## 2022-07-25 MED ORDER — LACTATED RINGERS IV SOLN: INTRAVENOUS | Status: DC

## 2022-07-25 MED ORDER — DEXTROSE 50 % IV SOLN
50.0000 mL | Freq: Once | INTRAVENOUS | Status: AC
  Administered 2022-07-25: 50 mL via INTRAVENOUS

## 2022-07-25 MED ORDER — THIAMINE MONONITRATE 100 MG PO TABS
100.0000 mg | ORAL_TABLET | Freq: Every day | ORAL | Status: DC
  Administered 2022-07-25 – 2022-07-27 (×2): 100 mg via ORAL
  Filled 2022-07-25 (×7): qty 1

## 2022-07-25 MED ORDER — SODIUM CHLORIDE 0.9 % IV SOLN: 2.0000 g | Freq: Two times a day (BID) | INTRAVENOUS | Status: DC

## 2022-07-25 MED ORDER — DEXTROSE 50 % IV SOLN: 1.0000 | Freq: Once | INTRAVENOUS | Status: AC

## 2022-07-25 MED ORDER — ENSURE ENLIVE PO LIQD
237.0000 mL | Freq: Three times a day (TID) | ORAL | Status: DC
  Administered 2022-07-25 (×2): 237 mL via ORAL
  Filled 2022-07-25: qty 237

## 2022-07-25 MED ORDER — POLYETHYLENE GLYCOL 3350 17 G PO PACK: 17.0000 g | PACK | Freq: Every day | ORAL | Status: DC | PRN

## 2022-07-25 MED ORDER — VANCOMYCIN HCL IN DEXTROSE 1-5 GM/200ML-% IV SOLN: 1000.0000 mg | INTRAVENOUS | Status: DC

## 2022-07-25 MED ORDER — IPRATROPIUM-ALBUTEROL 0.5-2.5 (3) MG/3ML IN SOLN
3.0000 mL | Freq: Four times a day (QID) | RESPIRATORY_TRACT | Status: DC
  Administered 2022-07-25: 3 mL via RESPIRATORY_TRACT
  Filled 2022-07-25: qty 3

## 2022-07-25 MED ORDER — LORAZEPAM 1 MG PO TABS
1.0000 mg | ORAL_TABLET | Freq: Four times a day (QID) | ORAL | Status: DC | PRN
  Administered 2022-07-25: 1 mg via ORAL
  Filled 2022-07-25: qty 1

## 2022-07-25 MED ORDER — DEXTROSE 10 % IV SOLN: INTRAVENOUS | Status: DC

## 2022-07-25 MED ORDER — ONDANSETRON HCL 4 MG/2ML IJ SOLN: 4.0000 mg | Freq: Four times a day (QID) | INTRAMUSCULAR | Status: DC | PRN

## 2022-07-25 MED ORDER — PANTOPRAZOLE SODIUM 40 MG PO TBEC
40.0000 mg | DELAYED_RELEASE_TABLET | Freq: Two times a day (BID) | ORAL | Status: DC
  Administered 2022-07-25 – 2022-07-27 (×3): 40 mg via ORAL
  Filled 2022-07-25 (×4): qty 1

## 2022-07-25 MED ORDER — TRAZODONE HCL 50 MG PO TABS: 50.0000 mg | ORAL_TABLET | Freq: Every evening | ORAL | Status: DC | PRN

## 2022-07-25 MED ORDER — FLUCONAZOLE 100 MG PO TABS
100.0000 mg | ORAL_TABLET | Freq: Every day | ORAL | Status: DC
  Administered 2022-07-25 – 2022-07-27 (×2): 100 mg via ORAL
  Filled 2022-07-25 (×3): qty 1

## 2022-07-25 MED ORDER — HEPARIN SODIUM (PORCINE) 5000 UNIT/ML IJ SOLN
5000.0000 [IU] | Freq: Three times a day (TID) | INTRAMUSCULAR | Status: DC
  Administered 2022-07-26 – 2022-07-27 (×2): 5000 [IU] via SUBCUTANEOUS
  Filled 2022-07-25 (×2): qty 1

## 2022-07-25 MED ORDER — MEGESTROL ACETATE 40 MG/ML PO SUSP
200.0000 mg | Freq: Every day | ORAL | Status: DC
  Administered 2022-07-27: 200 mg via ORAL
  Filled 2022-07-25 (×3): qty 5

## 2022-07-25 MED ORDER — IOHEXOL 350 MG/ML SOLN
60.0000 mL | Freq: Once | INTRAVENOUS | Status: AC | PRN
  Administered 2022-07-25: 60 mL via INTRAVENOUS

## 2022-07-25 MED ORDER — DEXTROSE 50 % IV SOLN
INTRAVENOUS | Status: AC
  Administered 2022-07-25: 50 mL via INTRAVENOUS
  Filled 2022-07-25: qty 50

## 2022-07-25 MED ORDER — SENNA 8.6 MG PO TABS
2.0000 | ORAL_TABLET | Freq: Every day | ORAL | Status: DC
  Administered 2022-07-25 – 2022-07-26 (×2): 17.2 mg via ORAL
  Filled 2022-07-25 (×2): qty 2

## 2022-07-25 MED ORDER — SODIUM CHLORIDE 0.9 % IV SOLN
2.0000 g | Freq: Once | INTRAVENOUS | Status: AC
  Administered 2022-07-25: 2 g via INTRAVENOUS
  Filled 2022-07-25: qty 12.5

## 2022-07-25 MED ORDER — ACETAMINOPHEN 325 MG PO TABS: 650.0000 mg | ORAL_TABLET | Freq: Four times a day (QID) | ORAL | Status: DC | PRN

## 2022-07-25 MED ORDER — HYDROCODONE-ACETAMINOPHEN 5-325 MG PO TABS
1.5000 | ORAL_TABLET | Freq: Four times a day (QID) | ORAL | Status: DC | PRN
  Administered 2022-07-25: 1.5 via ORAL
  Filled 2022-07-25: qty 2

## 2022-07-25 MED ORDER — DEXTROSE 50 % IV SOLN
1.0000 | Freq: Once | INTRAVENOUS | Status: DC
  Filled 2022-07-25: qty 50

## 2022-07-25 MED ORDER — FOLIC ACID 1 MG PO TABS
1.0000 mg | ORAL_TABLET | Freq: Every day | ORAL | Status: DC
  Administered 2022-07-25 – 2022-07-27 (×2): 1 mg via ORAL
  Filled 2022-07-25 (×3): qty 1

## 2022-07-25 MED ORDER — ONDANSETRON HCL 4 MG PO TABS: 4.0000 mg | ORAL_TABLET | Freq: Four times a day (QID) | ORAL | Status: DC | PRN

## 2022-07-25 MED ORDER — OCTREOTIDE ACETATE 50 MCG/ML IJ SOLN
100.0000 ug | Freq: Once | INTRAMUSCULAR | Status: AC
  Administered 2022-07-25: 100 ug via SUBCUTANEOUS
  Filled 2022-07-25: qty 2

## 2022-07-25 MED ORDER — ACETAMINOPHEN 650 MG RE SUPP: 650.0000 mg | Freq: Four times a day (QID) | RECTAL | Status: DC | PRN

## 2022-07-25 MED ORDER — FENTANYL CITRATE PF 50 MCG/ML IJ SOSY
25.0000 ug | PREFILLED_SYRINGE | INTRAMUSCULAR | Status: DC | PRN
  Administered 2022-07-25 – 2022-07-26 (×2): 25 ug via INTRAVENOUS
  Filled 2022-07-25 (×2): qty 1

## 2022-07-25 MED ORDER — BISACODYL 10 MG RE SUPP: 10.0000 mg | Freq: Every day | RECTAL | Status: DC | PRN

## 2022-07-25 MED ORDER — ALUM & MAG HYDROXIDE-SIMETH 200-200-20 MG/5ML PO SUSP
15.0000 mL | Freq: Four times a day (QID) | ORAL | Status: DC | PRN
  Administered 2022-07-25: 15 mL via ORAL
  Filled 2022-07-25: qty 30

## 2022-07-25 MED ORDER — DEXTROSE-NACL 5-0.45 % IV SOLN: INTRAVENOUS | Status: DC

## 2022-07-25 MED ORDER — TIZANIDINE HCL 4 MG PO TABS: 4.0000 mg | ORAL_TABLET | Freq: Four times a day (QID) | ORAL | Status: DC | PRN

## 2022-07-25 MED ORDER — ATORVASTATIN CALCIUM 40 MG PO TABS
40.0000 mg | ORAL_TABLET | Freq: Every day | ORAL | Status: DC
  Administered 2022-07-25 – 2022-07-26 (×2): 40 mg via ORAL
  Filled 2022-07-25 (×2): qty 1

## 2022-07-25 MED ORDER — LEVETIRACETAM 500 MG PO TABS
500.0000 mg | ORAL_TABLET | Freq: Two times a day (BID) | ORAL | Status: DC
  Administered 2022-07-25 – 2022-07-27 (×3): 500 mg via ORAL
  Filled 2022-07-25 (×4): qty 1

## 2022-07-25 MED ORDER — SODIUM CHLORIDE 0.9% FLUSH: 3.0000 mL | INTRAVENOUS | Status: DC | PRN

## 2022-07-25 MED ORDER — SUCRALFATE 1 G PO TABS
1.0000 g | ORAL_TABLET | Freq: Three times a day (TID) | ORAL | Status: DC
  Administered 2022-07-25 – 2022-07-27 (×5): 1 g via ORAL
  Filled 2022-07-25 (×7): qty 1

## 2022-07-25 MED ORDER — AMLODIPINE BESYLATE 5 MG PO TABS
10.0000 mg | ORAL_TABLET | Freq: Every day | ORAL | Status: DC
  Administered 2022-07-27: 10 mg via ORAL
  Filled 2022-07-25 (×2): qty 2

## 2022-07-25 MED ORDER — LABETALOL HCL 5 MG/ML IV SOLN: 10.0000 mg | INTRAVENOUS | Status: DC | PRN

## 2022-07-25 MED ORDER — METRONIDAZOLE 500 MG/100ML IV SOLN
500.0000 mg | Freq: Once | INTRAVENOUS | Status: AC
  Administered 2022-07-25: 500 mg via INTRAVENOUS
  Filled 2022-07-25: qty 100

## 2022-07-25 MED ORDER — SODIUM CHLORIDE 0.9 % IV SOLN: INTRAVENOUS | Status: DC | PRN

## 2022-07-25 MED ORDER — ASPIRIN 81 MG PO TBEC
81.0000 mg | DELAYED_RELEASE_TABLET | Freq: Every day | ORAL | Status: DC
  Administered 2022-07-25 – 2022-07-27 (×2): 81 mg via ORAL
  Filled 2022-07-25 (×3): qty 1

## 2022-07-25 MED ORDER — ADULT MULTIVITAMIN LIQUID CH
15.0000 mL | Freq: Every day | ORAL | Status: DC
  Administered 2022-07-27: 15 mL via ORAL
  Filled 2022-07-25 (×4): qty 15

## 2022-07-25 MED ORDER — UMECLIDINIUM-VILANTEROL 62.5-25 MCG/ACT IN AEPB
1.0000 | INHALATION_SPRAY | Freq: Every day | RESPIRATORY_TRACT | Status: DC
  Administered 2022-07-26 – 2022-07-27 (×2): 1 via RESPIRATORY_TRACT
  Filled 2022-07-25: qty 14

## 2022-07-25 MED ORDER — DEXTROSE-NACL 10-0.45 % IV SOLN
INTRAVENOUS | Status: DC
  Filled 2022-07-25 (×10): qty 1000

## 2022-07-25 MED ORDER — VANCOMYCIN HCL IN DEXTROSE 1-5 GM/200ML-% IV SOLN: 1000.0000 mg | Freq: Once | INTRAVENOUS | Status: DC

## 2022-07-25 MED ORDER — DEXTROSE-NACL 5-0.9 % IV SOLN: INTRAVENOUS | Status: DC

## 2022-07-25 MED ORDER — SODIUM CHLORIDE 0.9% FLUSH
3.0000 mL | Freq: Two times a day (BID) | INTRAVENOUS | Status: DC
  Administered 2022-07-25 – 2022-07-26 (×3): 3 mL via INTRAVENOUS

## 2022-07-25 MED ORDER — VANCOMYCIN HCL 1500 MG/300ML IV SOLN
1500.0000 mg | INTRAVENOUS | Status: AC
  Administered 2022-07-25: 1500 mg via INTRAVENOUS
  Filled 2022-07-25: qty 300

## 2022-07-25 MED ORDER — SODIUM CHLORIDE 0.9% FLUSH
3.0000 mL | Freq: Two times a day (BID) | INTRAVENOUS | Status: DC
  Administered 2022-07-26: 3 mL via INTRAVENOUS

## 2022-07-25 MED ORDER — LACTATED RINGERS IV BOLUS (SEPSIS)
1000.0000 mL | Freq: Once | INTRAVENOUS | Status: AC
  Administered 2022-07-25: 1000 mL via INTRAVENOUS

## 2022-07-25 MED ORDER — HYDROCODONE-ACETAMINOPHEN 7.5-325 MG PO TABS: 1.0000 | ORAL_TABLET | Freq: Four times a day (QID) | ORAL | Status: DC | PRN

## 2022-07-25 NOTE — Progress Notes (Signed)
Code Stroke Time Documentation  136 Call time 724 Beeper time 737 Exam Started 438 Exam finished 739 Images sent to [facility] 741 Exam completed in [facility] called

## 2022-07-25 NOTE — Progress Notes (Addendum)
Pharmacy Antibiotic Note  Colin Wagner is a 71 y.o. male admitted on 07/25/2022 with sepsis.  Pharmacy has been consulted for vanc and cefepime dosing.  Plan: Vancomycin 1500mg  IV x1, then 1gm IV q24h Cefepime 2gm IV q 12 Monitor renal function, cultures and de-escalation of therapy  Height: 5\' 11"  (180.3 cm) Weight: 78 kg (171 lb 15.3 oz) IBW/kg (Calculated) : 75.3 pAUC 547, Vd 0.72, ke 0.033  Temp (24hrs), Avg:97 F (36.1 C), Min:97 F (36.1 C), Max:97 F (36.1 C)  Recent Labs  Lab 07/23/22 1247 07/25/22 0738 07/25/22 0743  WBC  --  15.6*  --   CREATININE 1.92* 2.17* 2.10*    Estimated Creatinine Clearance: 33.9 mL/min (A) (by C-G formula based on SCr of 2.1 mg/dL (H)).    No Known Allergies  Antimicrobials this admission: 12/3 Flagy x1  Microbiology results: 12/3 BCx: P  Thank you for allowing pharmacy to be a part of this patient's care.  Lorenso Courier [facility] 07/25/2022 9:58 AM

## 2022-07-25 NOTE — ED Notes (Signed)
Date and time results received: 07/25/22 0749 (use smartphrase ".now" to insert current time)  Test: I-stat chem 8 Critical Value: glucose 28  Name of Provider Notified: Dr. Kathrynn Humble  Orders Received? Or Actions Taken?: RN notified.

## 2022-07-25 NOTE — H&P (Signed)
Patient Demographics:    Colin Wagner, is a 71 y.o. male  MRN: [id number]   DOB - March 25, 1951  Admit Date - 07/25/2022  Outpatient Primary MD for the patient is [name], MD   Assessment & Plan:   Assessment and Plan: 1)Recurrent hypoglycemia--- -Please note that EMS was called to patient's residence around 9 PM on 07/24/22 for an episode of low sugar patient received D50 IV at the time and patient refused transportation to the hospital-- -woke up am of 07/25/22 with another episode of low blood sugar -ound to have Blood glucose of 28, repeat glucose is 27 -Patient took Levemir insulin 32 units in pm of 07/24/22, and took Glipizide 2.5 mg in am of 07/25/22--- patient has not been eating or drinking well due to dysphagia -Give boost/Ensure -Hypoglycemia persisted with D5 solution after initially giving D50 IV -Switch to D10 continuous infusion   2)DM2-A1c was 9.4 on 07/13/2022 reflecting uncontrolled diabetes with hyperglycemia -Hold insulin regimen due to #1 above  3)H/o  dysphagia and failure to thrive --due to radiation therapy 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 Abbey Chatters GI service  4) history of Lt renal cancer--- states was treated and told that cancer is in remission  5)Primary malignant neoplasm right upper lobe - --Chest x-ray today showed Stable masslike opacity is identified in the medial right upper lobe/right paratracheal region unchanged. Lungs are otherwise clear.  Patient will need to  follow-up with pulmonology and oncology as outpatient as advised  6)GERD/PUD--- history of EtOH abuse and chronic pancreatitis -Continue Protonix -EGD planned for 07/26/2022 as above  7)COPD--- no acute exacerbation, continue bronchodilators  8)HTN--continue amlodipine -IV labetalol as needed elevated BP  9)PAD--- CTA head shows chronic occlusion of the left vertebral artery at the level of a severe left subclavian plaque with stenosis,  Stented right cervical carotid which is widely patent. And  Moderate generalized atheromatous irregularity of intracranial vessels. 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 Barriers: Not Clinically Stable-    With History of - Reviewed by me  Past Medical History:  Diagnosis Date   Alcohol abuse    Alcoholic pancreatitis 0160   admission   Chronic back pain    Chronic pancreatitis ([place])    based on ct findings 2016   COPD (chronic obstructive pulmonary disease) ([place])     Diabetes mellitus    type 2   Diverticulosis    Gastritis    GERD (gastroesophageal reflux disease)    Headache    History of radiation therapy 05/12/17- 06/22/17   Left cheek and bilateral neck/ 60 Gy in 30 fractions to gross disease   Hyperlipidemia    Hypertension    Jaw cancer ([place])    left jaw part of jaw bone removed   Peptic ulcer disease 1999   Per medical reports, no H pylori   Pneumonia    Renal cancer, left ([place]) 2012   he tells me that he has been released, ?and that he is free of cancer, and never had it to begin with.    Right shoulder pain    Testicular hypofunction       Past Surgical History:  Procedure Laterality Date   BALLOON DILATION  12/31/2021   Procedure: BALLOON DILATION;  Surgeon: [name], MD;  [facility];  Service: Endoscopy;;   BIOPSY  04/30/2022   Procedure: BIOPSY;  Surgeon: Maryjane Hurter, MD;  [facility];  Service: Pulmonary;;   CAROTID STENT  06/2018   COLONOSCOPY  2003   Dr. [name], polyps   COLONOSCOPY  2005   Dr. [name], multiple diverticula   COLONOSCOPY  2008   Dr. [name], diverticulosis   COLONOSCOPY WITH PROPOFOL N/A 10/21/2016   Dr. [name]: Diverticulosis, two 5-7 mm polyps removed. path-tubular adenomas.  Next colonoscopy in 5 years.   COLONOSCOPY WITH PROPOFOL N/A 12/31/2021   Procedure: COLONOSCOPY WITH PROPOFOL;  Surgeon: [name], MD;  [facility];  Service: Endoscopy;  Laterality: N/A;  8:15am   ENDOBRONCHIAL ULTRASOUND Right 04/30/2022   Procedure: ENDOBRONCHIAL ULTRASOUND;  Surgeon: Maryjane Hurter, MD;  [facility];  Service: Pulmonary;  Laterality: Right;   ESOPHAGOGASTRODUODENOSCOPY     Multiple EGDs. 1999 EGD showed gastric ulcers, no H pylori and benign biopsies performed by Dr. [name]. 2001 gastric ulcer healed. Last EGD 2005 had gastritis.   ESOPHAGOGASTRODUODENOSCOPY (EGD) WITH PROPOFOL N/A 12/31/2021   Procedure: ESOPHAGOGASTRODUODENOSCOPY  (EGD) WITH PROPOFOL;  Surgeon: [name], MD;  [facility];  Service: Endoscopy;  Laterality: N/A;   FINE NEEDLE ASPIRATION  04/30/2022   Procedure: FINE NEEDLE ASPIRATION (FNA) LINEAR;  Surgeon: Maryjane Hurter, MD;  [facility];  Service: Pulmonary;;   IR GASTROSTOMY TUBE REMOVAL  02/08/2020   IR IMAGING GUIDED PORT INSERTION  05/26/2022   IR REPLACE G-TUBE SIMPLE WO FLUORO  03/20/2019   IR REPLACE G-TUBE SIMPLE WO FLUORO  09/21/2019   IR REPLC GASTRO/COLONIC TUBE PERCUT W/FLUORO  05/16/2018   Left partial mandibulectomy, Scapular free flap reconstruction, selective neck dissection, tracheotomy, and resection of intraoral palate cancer. Left 03/01/2017   [facility]   LUNG BIOPSY  MOUTH SURGERY     PARTIAL NEPHRECTOMY Left 2012   POLYPECTOMY  10/21/2016   Procedure: POLYPECTOMY;  Surgeon: [name], MD;  [facility];  Service: Endoscopy;;  hepatic flexure x2   POLYPECTOMY  12/31/2021   Procedure: POLYPECTOMY INTESTINAL;  Surgeon: [name], MD;  [facility];  Service: Endoscopy;;   SURGERY OF LIP  06/2019   lip revision   TRANSURETHRAL RESECTION OF PROSTATE N/A 03/01/2019   Procedure: TRANSURETHRAL RESECTION OF THE PROSTATE (TURP)WITH CYSTOSCOPY;  Surgeon: Raynelle Bring, MD;  [facility];  Service: Urology;  Laterality: N/A;   VIDEO BRONCHOSCOPY Right 04/30/2022   Procedure: VIDEO BRONCHOSCOPY WITHOUT FLUORO;  Surgeon: Maryjane Hurter, MD;  [facility];  Service: Pulmonary;  Laterality: Right;      Chief Complaint  Patient presents with   Code Stroke      HPI:    Colin Wagner  is a 71 y.o. male with medical history significant for alcohol abuse,  chronic alcoholic pancreatitis, chronic back pain, COPD, type 2 diabetes, GERD, PUD, history of radiation therapy due to jaw cancer, hyperlipidemia, hypertension,  left renal cancer, dysphagia and failure to thrive presents to the ED with  unresponsiveness and found to have Blood glucose of 28, repeat glucose is 27 -Patient took Levemir insulin 32 units in pm of 07/24/22, and took Glipizide 2.5 mg in am of 07/25/22--- patient has not been eating or drinking well due to dysphagia -He was actually scheduled to have EGD with dilatation on 07/24/2022 No fever  Or chills  -Chest pains no palpitations no dizziness no shortness of breath no new focal deficits no Nausea, Vomiting or Diarrhea Please note that EMS was called to patient's residence around 9 PM on 12 10/12/2018 for episode of low sugar patient received D50 IV at the time and patient refused transportation to the hospital-- -woke up this morning with another episode of low blood sugar -Neurology was consulted in the ED initially due to concerns about possible stroke--- in retrospect altered mentation was hypoglycemia driven----CTA head showed chronic occlusion of the left vertebral artery at the level of a severe left subclavian plaque with stenosis, Stented right cervical carotid which is widely patent, Moderate generalized atheromatous irregularity of intracranial vessels.  -Chest x-ray today showed Stable masslike opacity is identified in the medial right upper lobe/right paratracheal region unchanged. Lungs are otherwise clear.  Review of systems:    In addition to the HPI above,   A full Review of  Systems was done, all other systems reviewed are negative except as noted above in HPI , .    Social History:  Reviewed by me    Social History   Tobacco Use   Smoking status: Former    Packs/day: 0.50    Years: 35.00    Total pack years: 17.50    Types: Cigarettes    Quit date: 03/01/2017    Years since quitting: 5.4   Smokeless tobacco: Never   Tobacco comments:    quit 02-2017  Substance Use Topics   Alcohol use: No    Comment: quit 2000 but relapse in 2016. no etoh since hospitalized 2016.        Family History :  Reviewed by me    Family History   Problem Relation Age of Onset   Hypertension Mother    Colon cancer Neg Hx     Home Medications:   Prior to Admission medications   Medication Sig Start  Date End Date Taking? Authorizing Provider  acetaminophen (TYLENOL) 500 MG tablet Take 1,000 mg by mouth every 6 (six) hours as needed for mild pain or moderate pain.   Yes [provider]  albuterol (VENTOLIN HFA) 108 (90 Base) MCG/ACT inhaler Inhale 2 puffs into the lungs every 4 (four) hours as needed for wheezing or shortness of breath. 03/29/22  Yes [provider]  amLODipine (NORVASC) 10 MG tablet Take 1 tablet (10 mg total) by mouth daily. 07/14/22  Yes Eugenie Filler, MD  aspirin EC 81 MG tablet Take 81 mg by mouth daily.   Yes [provider]  atorvastatin (LIPITOR) 40 MG tablet Take 40 mg by mouth daily. 02/14/17  Yes [provider]  cholecalciferol (VITAMIN D) 25 MCG (1000 UNIT) tablet Take 1,000 Units by mouth daily.   Yes [provider]  fluconazole (DIFLUCAN) 100 MG tablet Take 100 mg by mouth daily. 07/20/22  Yes [provider]  fluticasone (FLONASE) 50 MCG/ACT nasal spray Place 2 sprays into both nostrils daily.   Yes [provider]  GARLIC PO Take 1 tablet by mouth daily.   Yes [provider]  HYDROcodone-acetaminophen (NORCO) 7.5-325 MG tablet Take 1 tablet by mouth every 6 (six) hours as needed for moderate pain. 06/30/22  Yes [provider]  LEVEMIR FLEXTOUCH 100 UNIT/ML FlexTouch Pen Inject 32 Units into the skin every evening. 07/14/22  Yes Eugenie Filler, MD  levETIRAcetam (KEPPRA) 500 MG tablet Take 1 tablet (500 mg total) by mouth 2 (two) times daily. 07/14/22  Yes Eugenie Filler, MD  LORazepam (ATIVAN) 1 MG tablet Take 1 mg by mouth every 6 (six) hours as needed for anxiety or sleep. 03/31/15  Yes [provider]  megestrol (MEGACE) 40 MG/ML suspension Take 200 mg by mouth daily.   Yes [provider]   metoCLOPramide (REGLAN) 10 MG tablet Take 1 tablet (10 mg total) by mouth every 6 (six) hours as needed (Hiccups.). 07/14/22 07/14/23 Yes Eugenie Filler, MD  nystatin cream (MYCOSTATIN) Apply to affected area 2 times daily 06/13/22  Yes Volney American, PA-C  OVER THE COUNTER MEDICATION Take 10 mLs by mouth 2 (two) times daily. maalox   Yes [provider]  pantoprazole (PROTONIX) 40 MG tablet Take 1 tablet (40 mg total) by mouth 2 (two) times daily before a meal. 04/06/22  Yes Rourk, Cristopher Estimable, MD  prochlorperazine (COMPAZINE) 10 MG tablet TAKE 1 TABLET(10 MG) BY MOUTH EVERY 6 HOURS AS NEEDED FOR NAUSEA OR VOMITING Patient taking differently: Take 10 mg by mouth every 6 (six) hours as needed for vomiting or nausea. 07/15/22  Yes Heilingoetter, Cassandra L, PA-C  senna (SENOKOT) 8.6 MG TABS tablet Take 1 tablet (8.6 mg total) by mouth at bedtime as needed for mild constipation. May be needed as pain medication can constipate. 01/12/17  Yes [name], MD  sildenafil (VIAGRA) 100 MG tablet Take 100 mg by mouth daily as needed for erectile dysfunction.   Yes [provider]  sucralfate (CARAFATE) 1 g tablet Take 1 tablet (1 g total) by mouth 3 (three) times daily as needed. May dissolve 1 tab and glass of water and drink as needed 06/13/22  Yes Volney American, PA-C  testosterone cypionate (DEPOTESTOSTERONE CYPIONATE) 200 MG/ML injection Inject 1.5 mLs into the muscle every 14 (fourteen) days. 05/22/22  Yes [provider]  tiZANidine (ZANAFLEX) 4 MG tablet Take 4 mg by mouth every 6 (six) hours as needed for  muscle spasms. 06/30/22  Yes [provider]  umeclidinium-vilanterol (ANORO ELLIPTA) 62.5-25 MCG/ACT AEPB Inhale 1 puff into the lungs daily. 05/19/22  Yes Spero Geralds, MD  clopidogrel (PLAVIX) 75 MG tablet Take 75 mg by mouth daily. 05/27/19   [provider]  ferrous sulfate 325 (65 FE) MG tablet Take 325 mg by mouth daily with  breakfast.    [provider]  glipiZIDE (GLUCOTROL XL) 2.5 MG 24 hr tablet Take 2.5 mg by mouth every morning. 08/11/18   [provider]  tadalafil (CIALIS) 20 MG tablet Take 1 tablet (20 mg total) by mouth daily as needed for erectile dysfunction. Take 30-60 minutes prior to intercourse Patient not taking: Reported on 07/25/2022 11/19/21   Primus Bravo., MD     Allergies:     Allergies  Allergen Reactions   Trazodone Other (See Comments)    Seizure     Physical Exam:   Vitals  Blood pressure 123/65, pulse 98, temperature 97.8 F (36.6 C), temperature source Oral, resp. rate (!) 21, height 5\' 11"  (1.803 m), weight 78 kg, SpO2 99 %.  Physical Examination: General appearance -more awake, more interactive, no acute distress Mental status - alert, oriented to person, place, and time,  HEENT-chronic facial asymmetry due to prior jaw surgery and radiation Eyes - sclera anicteric Neck - supple, no JVD elevation , Chest -somewhat diminished breath sounds, no wheezing  heart - S1 and S2 normal, regular  Abdomen - soft, nontender, nondistended, +BS Neurological - screening mental status exam normal, neck supple without rigidity, cranial nerves II through XII intact, DTR's normal and symmetric Extremities - no pedal edema noted, intact peripheral pulses  Skin - warm, dry     Data Review:    CBC Recent Labs  Lab 07/25/22 0738 07/25/22 0743  WBC 15.6*  --   HGB 11.1* 12.6*  HCT 33.8* 37.0*  PLT 398  --   MCV 81.3  --   MCH 26.7  --   MCHC 32.8  --   RDW 20.0*  --   LYMPHSABS 0.4*  --   MONOABS 1.2*  --   EOSABS 0.0  --   BASOSABS 0.0  --    ------------------------------------------------------------------------------------------------------------------  Chemistries  Recent Labs  Lab 07/23/22 1247 07/25/22 0738 07/25/22 0743  NA 131* 135 134*  K 4.4 4.1 3.8  CL 96* 97* 97*  CO2 23 27  --   GLUCOSE 55* 33* 28*  BUN 33* 40* 34*   CREATININE 1.92* 2.17* 2.10*  CALCIUM 9.7 9.9  --   AST  --  55*  --   ALT  --  23  --   ALKPHOS  --  87  --   BILITOT  --  0.1*  --    ------------------------------------------------------------------------------------------------------------------ estimated creatinine clearance is 33.9 mL/min (A) (by C-G formula based on SCr of 2.1 mg/dL (H)). ------------------------------------------------------------------------------------------------------------------ No results for input(s): "TSH", "T4TOTAL", "T3FREE", "THYROIDAB" in the last 72 hours.  Invalid input(s): "FREET3"   Coagulation profile Recent Labs  Lab 07/25/22 0738  INR 1.2   ------------------------------------------------------------------------------------------------------------------- No results for input(s): "DDIMER" in the last 72 hours. -------------------------------------------------------------------------------------------------------------------  Cardiac Enzymes No results for input(s): "CKMB", "TROPONINI", "MYOGLOBIN" in the last 168 hours.  Invalid input(s): "CK" ------------------------------------------------------------------------------------------------------------------ No results found for: "BNP"   ---------------------------------------------------------------------------------------------------------------  Urinalysis    Component Value Date/Time   COLORURINE YELLOW 07/25/2022 0829   APPEARANCEUR CLEAR 07/25/2022 0829   APPEARANCEUR Clear 11/19/2021 1155   LABSPEC 1.019 07/25/2022  [place] 6.0 07/25/2022 0829   GLUCOSEU 150 (A) 07/25/2022 0829   HGBUR MODERATE (A) 07/25/2022 0829   BILIRUBINUR NEGATIVE 07/25/2022 0829   BILIRUBINUR Negative 11/19/2021 [place] 07/25/2022 0829   PROTEINUR 30 (A) 07/25/2022 0829   UROBILINOGEN 0.2 04/27/2015 1916   NITRITE NEGATIVE 07/25/2022 0829   LEUKOCYTESUR NEGATIVE 07/25/2022 0829     ----------------------------------------------------------------------------------------------------------------   Imaging Results:    DG Chest Port 1 View  Result Date: 07/25/2022 CLINICAL DATA:  Low blood sugar last night. EXAM: PORTABLE CHEST 1 VIEW COMPARISON:  July 12 2022, chest CT July 10, 2022 FINDINGS: The heart size and mediastinal contours are stable. Stable masslike opacity is identified in the medial right upper lobe/right paratracheal region unchanged. Left central venous lines tendons at distal tip in the superior vena cava unchanged. Both lungs are otherwise clear. The visualized skeletal structures are unremarkable. IMPRESSION: Stable masslike opacity is identified in the medial right upper lobe/right paratracheal region unchanged. Lungs are otherwise clear. Electronically Signed   By: Abelardo Diesel M.D.   On: 07/25/2022 08:57   CT ANGIO HEAD NECK W WO CM (CODE STROKE)  Result Date: 07/25/2022 CLINICAL DATA:  Right-sided weakness with facial droop. Stroke suspected EXAM: CT ANGIOGRAPHY HEAD AND NECK TECHNIQUE: Multidetector CT imaging of the head and neck was performed using the standard protocol during bolus administration of intravenous contrast. Multiplanar CT image reconstructions and MIPs were obtained to evaluate the vascular anatomy. Carotid stenosis measurements (when applicable) are obtained utilizing NASCET criteria, using the distal internal carotid diameter as the denominator. RADIATION DOSE REDUCTION: This exam was performed according to the departmental dose-optimization program which includes automated exposure control, adjustment of the mA and/or kV according to patient size and/or use of iterative reconstruction technique. CONTRAST:  39mL OMNIPAQUE IOHEXOL 350 MG/ML SOLN COMPARISON:  Head CT from today.  Intracranial MRA 07/12/2022 FINDINGS: CTA NECK FINDINGS Aortic arch: Atherosclerosis with irregular plaque. Two vessel branching. Right carotid system:  Atheromatous plaque with stent spanning the distal common carotid to proximal ICA. No in stent stenosis. Left carotid system: Atheromatous wall thickening diffusely with intermittent calcification. No stenosis or ulceration. Vertebral arteries: Proximal subclavian atherosclerosis with advanced low-density plaque at the left vertebral origin. The left vertebral artery is nonenhancing essentially throughout its course. The right vertebral artery is patent with mild atheromatous narrowing at its origin. Distal right vertebral fenestration at the level of C1. Skeleton: Left mandibular fracture/osteotomy fixation without bony bridging but also no sign of mandibular hardware loosening. Generalized cervical spine degeneration. Other neck: Postoperative left lateral neck and face with flap no aggressive finding. Left mastoid and middle ear opacification, chronic and possibly from prior radiotherapy. Upper chest: Known right upper lobe mass. Review of the MIP images confirms the above findings CTA HEAD FINDINGS Anterior circulation: Atheromatous plaque along the carotid siphons. Moderate diffuse atheromatous irregularity of MCA branches. No branch occlusion or aneurysm. Posterior circulation: Diseased left vertebral artery. The basilar is primarily filled from the right vertebral artery. Proximal basilar fenestration. Atheromatous irregularity of the basilar which is diminutive due to fetal type bilateral PCA flow. Moderate atheromatous irregularity of the posterior cerebral arteries. Venous sinuses: Negative Anatomic variants: As above Review of the MIP images confirms the above findings IMPRESSION: 1. No emergent finding. 2. 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 Stented right cervical carotid which is widely patent. 4. Moderate generalized atheromatous irregularity of intracranial  vessels. Electronically Signed   By: Jorje Guild M.D.   On:  07/25/2022 08:06   CT HEAD CODE STROKE WO CONTRAST  Result Date: 07/25/2022 CLINICAL DATA:  Code stroke.  Right-sided weakness and facial droop EXAM: CT HEAD WITHOUT CONTRAST TECHNIQUE: Contiguous axial images were obtained from the base of the skull through the vertex without intravenous contrast. RADIATION DOSE REDUCTION: This exam was performed according to the departmental dose-optimization program which includes automated exposure control, adjustment of the mA and/or kV according to patient size and/or use of iterative reconstruction technique. COMPARISON:  07/12/2022 FINDINGS: Brain: No evidence of acut generalized cerebral volume loss e infarction, hemorrhage, hydrocephalus, extra-axial collection or mass lesion/mass effect. Vascular: No hyperdense vessel or unexpected calcification. Skull: Normal. Negative for fracture or focal lesion. Sinuses/Orbits: No acute finding. Other: These results were by epic chat at the time of interpretation on 07/25/2022 at 7:46 am to provider ANKIT NANAVATI. ASPECTS [facility] Stroke Program Early CT Score) - Ganglionic level infarction (caudate, lentiform nuclei, internal capsule, insula, M1-M3 cortex): 7 - Supraganglionic infarction (M4-M6 cortex): 3 Total score (0-10 with 10 being normal): 10 IMPRESSION: 1. Stable head CT.  No acute finding. 2. ASPECTS is 10. Electronically Signed   By: Jorje Guild M.D.   On: 07/25/2022 07:46    Radiological Exams on Admission: DG Chest Port 1 View  Result Date: 07/25/2022 CLINICAL DATA:  Low blood sugar last night. EXAM: PORTABLE CHEST 1 VIEW COMPARISON:  July 12 2022, chest CT July 10, 2022 FINDINGS: The heart size and mediastinal contours are stable. Stable masslike opacity is identified in the medial right upper lobe/right paratracheal region unchanged. Left central venous lines tendons at distal tip in the superior vena cava unchanged. Both lungs are otherwise clear. The visualized skeletal structures are unremarkable.  IMPRESSION: Stable masslike opacity is identified in the medial right upper lobe/right paratracheal region unchanged. Lungs are otherwise clear. Electronically Signed   By: Abelardo Diesel M.D.   On: 07/25/2022 08:57   CT ANGIO HEAD NECK W WO CM (CODE STROKE)  Result Date: 07/25/2022 CLINICAL DATA:  Right-sided weakness with facial droop. Stroke suspected EXAM: CT ANGIOGRAPHY HEAD AND NECK TECHNIQUE: Multidetector CT imaging of the head and neck was performed using the standard protocol during bolus administration of intravenous contrast. Multiplanar CT image reconstructions and MIPs were obtained to evaluate the vascular anatomy. Carotid stenosis measurements (when applicable) are obtained utilizing NASCET criteria, using the distal internal carotid diameter as the denominator. RADIATION DOSE REDUCTION: This exam was performed according to the departmental dose-optimization program which includes automated exposure control, adjustment of the mA and/or kV according to patient size and/or use of iterative reconstruction technique. CONTRAST:  48mL OMNIPAQUE IOHEXOL 350 MG/ML SOLN COMPARISON:  Head CT from today.  Intracranial MRA 07/12/2022 FINDINGS: CTA NECK FINDINGS Aortic arch: Atherosclerosis with irregular plaque. Two vessel branching. Right carotid system: Atheromatous plaque with stent spanning the distal common carotid to proximal ICA. No in stent stenosis. Left carotid system: Atheromatous wall thickening diffusely with intermittent calcification. No stenosis or ulceration. Vertebral arteries: Proximal subclavian atherosclerosis with advanced low-density plaque at the left vertebral origin. The left vertebral artery is nonenhancing essentially throughout its course. The right vertebral artery is patent with mild atheromatous narrowing at its origin. Distal right vertebral fenestration at the level of C1. Skeleton: Left mandibular fracture/osteotomy fixation without bony bridging but also no sign of  mandibular hardware loosening. Generalized cervical spine degeneration. Other neck: Postoperative left lateral neck and face with flap  no aggressive finding. Left mastoid and middle ear opacification, chronic and possibly from prior radiotherapy. Upper chest: Known right upper lobe mass. Review of the MIP images confirms the above findings CTA HEAD FINDINGS Anterior circulation: Atheromatous plaque along the carotid siphons. Moderate diffuse atheromatous irregularity of MCA branches. No branch occlusion or aneurysm. Posterior circulation: Diseased left vertebral artery. The basilar is primarily filled from the right vertebral artery. Proximal basilar fenestration. Atheromatous irregularity of the basilar which is diminutive due to fetal type bilateral PCA flow. Moderate atheromatous irregularity of the posterior cerebral arteries. Venous sinuses: Negative Anatomic variants: As above Review of the MIP images confirms the above findings IMPRESSION: 1. No emergent finding. 2. 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 Stented right cervical carotid which is widely patent. 4. Moderate generalized atheromatous irregularity of intracranial vessels. Electronically Signed   By: Jorje Guild M.D.   On: 07/25/2022 08:06   CT HEAD CODE STROKE WO CONTRAST  Result Date: 07/25/2022 CLINICAL DATA:  Code stroke.  Right-sided weakness and facial droop EXAM: CT HEAD WITHOUT CONTRAST TECHNIQUE: Contiguous axial images were obtained from the base of the skull through the vertex without intravenous contrast. RADIATION DOSE REDUCTION: This exam was performed according to the departmental dose-optimization program which includes automated exposure control, adjustment of the mA and/or kV according to patient size and/or use of iterative reconstruction technique. COMPARISON:  07/12/2022 FINDINGS: Brain: No evidence of acut generalized cerebral volume loss e  infarction, hemorrhage, hydrocephalus, extra-axial collection or mass lesion/mass effect. Vascular: No hyperdense vessel or unexpected calcification. Skull: Normal. Negative for fracture or focal lesion. Sinuses/Orbits: No acute finding. Other: These results were by epic chat at the time of interpretation on 07/25/2022 at 7:46 am to provider ANKIT NANAVATI. ASPECTS [facility] Stroke Program Early CT Score) - Ganglionic level infarction (caudate, lentiform nuclei, internal capsule, insula, M1-M3 cortex): 7 - Supraganglionic infarction (M4-M6 cortex): 3 Total score (0-10 with 10 being normal): 10 IMPRESSION: 1. Stable head CT.  No acute finding. 2. ASPECTS is 10. Electronically Signed   By: Jorje Guild M.D.   On: 07/25/2022 07:46    DVT Prophylaxis -SCD /heparin AM Labs Ordered, also please review Full Orders  Family Communication: Admission, patients condition and plan of care including tests being ordered have been discussed with the patient and wife who indicate understanding and agree with the plan   Condition   stable  Roxan Hockey M.D on 07/25/2022 at 5:01 PM Go to [email or web address] -  for contact info  [facility] - Office  [phone]

## 2022-07-25 NOTE — ED Notes (Signed)
Code Stroke paged out prior to EMS arrival.

## 2022-07-25 NOTE — ED Triage Notes (Signed)
"  EMS was called out last night around 2100 for low blood sugar. Patient got 1 amp of D50 and returned to baseline (A&O x 4 and ambulatory per wife) and refused transport. Went to bed around midnight. This morning he woke wife up by throwing pillows and having slurred speech. While on phone with dispatch wife checked blood sugar and it was 75. On our arrival patient was noted to have right sided droop and slurred speech. CBG was 92. While in route patient became more altered" per EMS  Code stroke called at 0724 On arrival patient's speech not understandable. Unable to follow commands. CBG 27.

## 2022-07-25 NOTE — Consult Note (Addendum)
Code stroke activated at [place] by EMS pre elert. EDP saw pt on arrival at 82. Blood sugar reported to [facility] was 90. Pt returned from [place] at 0752. RN reported blood sugar was 27 and received amp of D 50 with symptoms now improving. Neuro paged at [phone]. Dr Wolfgang Phoenix on camera for neuro exam at 0800. Report and image results given at this time. Off camera at 0815. mRS 1.

## 2022-07-25 NOTE — ED Notes (Signed)
CBG 27. Pt's CBG rechecked per Dr. Andree Elk verbal order and repeat CBG read 27 again.

## 2022-07-25 NOTE — Sepsis Progress Note (Signed)
Sepsis protocol is being followed by [facility]. 

## 2022-07-25 NOTE — ED Triage Notes (Signed)
"  Wife states patient did fall out of bed 3 days ago. Complained of pain to right side of head and right hip" per EMS

## 2022-07-25 NOTE — ED Notes (Signed)
Pt taken to CT on EMS stretcher with RN at bedside.

## 2022-07-25 NOTE — ED Provider Notes (Addendum)
[facility] EMERGENCY DEPARTMENT Provider Note   CSN: [id number] Arrival date & time: 07/25/22  0725     History  Chief Complaint  Patient presents with   Code Stroke    Colin Wagner is a 71 y.o. male.  HPI    71 y.o. male with medical history significant of alcohol abuse, chronic pancreatitis, chronic back COPD, type 2 diabetes, pud, renal/lung CA recent admission for seizure-like activity comes in with chief complaint of possible stroke.  According to the EMS, patient was last seen normal at midnight when family went to bed.  This morning patient woke family up by throwing pillow at them.  He was found to have slurred speech, right-sided weakness.  Blood sugar for EMS was normal.  Blood sugar in the ER is noted to be 27. Patient unable to communicate.  Level 5 caveat for patient was unable to communicate.    Home Medications Prior to Admission medications   Medication Sig Start Date End Date Taking? Authorizing Provider  acetaminophen (TYLENOL) 500 MG tablet Take 1,000 mg by mouth every 6 (six) hours as needed for mild pain or moderate pain.    [provider]  albuterol (VENTOLIN HFA) 108 (90 Base) MCG/ACT inhaler Inhale 2 puffs into the lungs every 4 (four) hours as needed for wheezing or shortness of breath. 03/29/22   [provider]  amLODipine (NORVASC) 10 MG tablet Take 1 tablet (10 mg total) by mouth daily. 07/14/22   Eugenie Filler, MD  aspirin EC 81 MG tablet Take 81 mg by mouth daily.    [provider]  atorvastatin (LIPITOR) 40 MG tablet Take 40 mg by mouth daily. 02/14/17   [provider]  cholecalciferol (VITAMIN D) 25 MCG (1000 UNIT) tablet Take 1,000 Units by mouth daily.    [provider]  clopidogrel (PLAVIX) 75 MG tablet Take 75 mg by mouth daily. 05/27/19   [provider]  ferrous sulfate 325 (65 FE) MG tablet Take 325 mg by mouth daily with breakfast.    [provider]  fluticasone  (FLONASE) 50 MCG/ACT nasal spray Place 2 sprays into both nostrils daily.    [provider]  GARLIC PO Take 1 tablet by mouth daily.    [provider]  glipiZIDE (GLUCOTROL XL) 2.5 MG 24 hr tablet Take 2.5 mg by mouth every morning. 08/11/18   [provider]  hydrochlorothiazide (HYDRODIURIL) 12.5 MG tablet Take 12.5 mg by mouth daily. 04/24/19   [provider]  HYDROcodone-acetaminophen (NORCO) 7.5-325 MG tablet Take 1 tablet by mouth every 6 (six) hours as needed for moderate pain. 06/30/22   [provider]  LEVEMIR FLEXTOUCH 100 UNIT/ML FlexTouch Pen Inject 32 Units into the skin every evening. 07/14/22   Eugenie Filler, MD  levETIRAcetam (KEPPRA) 500 MG tablet Take 1 tablet (500 mg total) by mouth 2 (two) times daily. 07/14/22   Eugenie Filler, MD  lidocaine-prilocaine (EMLA) cream Apply to the Port-A-Cath site 30-60 minutes before treatment. Patient taking differently: Apply 1 Application topically See admin instructions. Apply to the Port-A-Cath site 30-60 minutes before treatment. 05/20/22   Curt Bears, MD  LORazepam (ATIVAN) 1 MG tablet Take 1 mg by mouth every 6 (six) hours as needed for anxiety or sleep. 03/31/15   [provider]  metoCLOPramide (REGLAN) 10 MG tablet Take 1 tablet (10 mg total) by mouth every 6 (six) hours as needed (Hiccups.). 07/14/22 07/14/23  Eugenie Filler, MD  nystatin cream (  MYCOSTATIN) Apply to affected area 2 times daily 06/13/22   Volney American, PA-C  pantoprazole (PROTONIX) 40 MG tablet Take 1 tablet (40 mg total) by mouth 2 (two) times daily before a meal. 04/06/22   Rourk, Cristopher Estimable, MD  prochlorperazine (COMPAZINE) 10 MG tablet TAKE 1 TABLET(10 MG) BY MOUTH EVERY 6 HOURS AS NEEDED FOR NAUSEA OR VOMITING 07/15/22   Heilingoetter, Cassandra L, PA-C  senna (SENOKOT) 8.6 MG TABS tablet Take 1 tablet (8.6 mg total) by mouth at bedtime as needed for mild constipation. May be needed as pain  medication can constipate. 01/12/17   [name], MD  sucralfate (CARAFATE) 1 g tablet Take 1 tablet (1 g total) by mouth 3 (three) times daily as needed. May dissolve 1 tab and glass of water and drink as needed 06/13/22   Volney American, PA-C  tadalafil (CIALIS) 20 MG tablet Take 1 tablet (20 mg total) by mouth daily as needed for erectile dysfunction. Take 30-60 minutes prior to intercourse 11/19/21   Stoneking, Reece Leader., MD  testosterone cypionate (DEPOTESTOSTERONE CYPIONATE) 200 MG/ML injection Inject 1.5 mLs into the muscle every 14 (fourteen) days. 05/22/22   [provider]  tiZANidine (ZANAFLEX) 4 MG tablet Take 4 mg by mouth every 6 (six) hours as needed for muscle spasms. 06/30/22   [provider]  umeclidinium-vilanterol (ANORO ELLIPTA) 62.5-25 MCG/ACT AEPB Inhale 1 puff into the lungs daily. 05/19/22   Spero Geralds, MD      Allergies    Patient has no known allergies.    Review of Systems   Review of Systems  Physical Exam Updated Vital Signs BP (!) 150/63   Pulse (!) 118   Temp (!) 97 F (36.1 C) (Oral)   Resp (!) 24   Ht 5\' 11"  (1.803 m)   Wt 78 kg   SpO2 98%   BMI 23.98 kg/m  Physical Exam Vitals and nursing note reviewed.  Constitutional:      Appearance: He is well-developed.  HENT:     Head: Atraumatic.  Cardiovascular:     Rate and Rhythm: Normal rate.  Pulmonary:     Effort: Pulmonary effort is normal.  Musculoskeletal:     Cervical back: Neck supple.  Skin:    General: Skin is warm.  Neurological:     Mental Status: He is alert.     ED Results / Procedures / Treatments   Labs (all labs ordered are listed, but only abnormal results are displayed) Labs Reviewed  CBC - Abnormal; Notable for the following components:      Result Value   WBC 15.6 (*)    RBC 4.16 (*)    Hemoglobin 11.1 (*)    HCT 33.8 (*)    RDW 20.0 (*)    All other components within normal limits  DIFFERENTIAL - Abnormal; Notable for the  following components:   Neutro Abs 13.8 (*)    Lymphs Abs 0.4 (*)    Monocytes Absolute 1.2 (*)    All other components within normal limits  COMPREHENSIVE METABOLIC PANEL - Abnormal; Notable for the following components:   Chloride 97 (*)    Glucose, Bld 33 (*)    BUN 40 (*)    Creatinine, Ser 2.17 (*)    Total Protein 8.2 (*)    AST 55 (*)    Total Bilirubin 0.1 (*)    GFR, Estimated 32 (*)    All other components within normal limits  RAPID URINE DRUG SCREEN,  HOSP PERFORMED - Abnormal; Notable for the following components:   Opiates POSITIVE (*)    All other components within normal limits  URINALYSIS, ROUTINE W REFLEX MICROSCOPIC - Abnormal; Notable for the following components:   Glucose, UA 150 (*)    Hgb urine dipstick MODERATE (*)    Protein, ur 30 (*)    All other components within normal limits  I-STAT CHEM 8, ED - Abnormal; Notable for the following components:   Sodium 134 (*)    Chloride 97 (*)    BUN 34 (*)    Creatinine, Ser 2.10 (*)    Glucose, Bld 28 (*)    Hemoglobin 12.6 (*)    HCT 37.0 (*)    All other components within normal limits  CBG MONITORING, ED - Abnormal; Notable for the following components:   Glucose-Capillary 161 (*)    All other components within normal limits  CBG MONITORING, ED - Abnormal; Notable for the following components:   Glucose-Capillary 53 (*)    All other components within normal limits  CBG MONITORING, ED - Abnormal; Notable for the following components:   Glucose-Capillary 50 (*)    All other components within normal limits  RESP PANEL BY RT-PCR (FLU A&B, COVID) ARPGX2  CULTURE, BLOOD (ROUTINE X 2)  CULTURE, BLOOD (ROUTINE X 2)  URINE CULTURE  ETHANOL  PROTIME-INR  APTT  LACTIC ACID, PLASMA  LACTIC ACID, PLASMA    EKG EKG Interpretation  Date/Time:  Sunday July 25 2022 08:02:51 EST Ventricular Rate:  124 PR Interval:  158 QRS Duration: 74 QT Interval:  294 QTC Calculation: 423 R Axis:   80 Text  Interpretation: Sinus tachycardia LAE, consider biatrial enlargement Anteroseptal infarct, age indeterminate No acute changes Confirmed by Varney Biles ([id number]) on 07/25/2022 1:03:17 PM  Radiology DG Chest Port 1 View  Result Date: 07/25/2022 CLINICAL DATA:  Low blood sugar last night. EXAM: PORTABLE CHEST 1 VIEW COMPARISON:  July 12 2022, chest CT July 10, 2022 FINDINGS: The heart size and mediastinal contours are stable. Stable masslike opacity is identified in the medial right upper lobe/right paratracheal region unchanged. Left central venous lines tendons at distal tip in the superior vena cava unchanged. Both lungs are otherwise clear. The visualized skeletal structures are unremarkable. IMPRESSION: Stable masslike opacity is identified in the medial right upper lobe/right paratracheal region unchanged. Lungs are otherwise clear. Electronically Signed   By: Abelardo Diesel M.D.   On: 07/25/2022 08:57   CT ANGIO HEAD NECK W WO CM (CODE STROKE)  Result Date: 07/25/2022 CLINICAL DATA:  Right-sided weakness with facial droop. Stroke suspected EXAM: CT ANGIOGRAPHY HEAD AND NECK TECHNIQUE: Multidetector CT imaging of the head and neck was performed using the standard protocol during bolus administration of intravenous contrast. Multiplanar CT image reconstructions and MIPs were obtained to evaluate the vascular anatomy. Carotid stenosis measurements (when applicable) are obtained utilizing NASCET criteria, using the distal internal carotid diameter as the denominator. RADIATION DOSE REDUCTION: This exam was performed according to the departmental dose-optimization program which includes automated exposure control, adjustment of the mA and/or kV according to patient size and/or use of iterative reconstruction technique. CONTRAST:  31mL OMNIPAQUE IOHEXOL 350 MG/ML SOLN COMPARISON:  Head CT from today.  Intracranial MRA 07/12/2022 FINDINGS: CTA NECK FINDINGS Aortic arch: Atherosclerosis with irregular  plaque. Two vessel branching. Right carotid system: Atheromatous plaque with stent spanning the distal common carotid to proximal ICA. No in stent stenosis. Left carotid system: Atheromatous wall thickening diffusely with intermittent calcification.  No stenosis or ulceration. Vertebral arteries: Proximal subclavian atherosclerosis with advanced low-density plaque at the left vertebral origin. The left vertebral artery is nonenhancing essentially throughout its course. The right vertebral artery is patent with mild atheromatous narrowing at its origin. Distal right vertebral fenestration at the level of C1. Skeleton: Left mandibular fracture/osteotomy fixation without bony bridging but also no sign of mandibular hardware loosening. Generalized cervical spine degeneration. Other neck: Postoperative left lateral neck and face with flap no aggressive finding. Left mastoid and middle ear opacification, chronic and possibly from prior radiotherapy. Upper chest: Known right upper lobe mass. Review of the MIP images confirms the above findings CTA HEAD FINDINGS Anterior circulation: Atheromatous plaque along the carotid siphons. Moderate diffuse atheromatous irregularity of MCA branches. No branch occlusion or aneurysm. Posterior circulation: Diseased left vertebral artery. The basilar is primarily filled from the right vertebral artery. Proximal basilar fenestration. Atheromatous irregularity of the basilar which is diminutive due to fetal type bilateral PCA flow. Moderate atheromatous irregularity of the posterior cerebral arteries. Venous sinuses: Negative Anatomic variants: As above Review of the MIP images confirms the above findings IMPRESSION: 1. No emergent finding. 2. 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 Stented right cervical carotid which is widely patent. 4. Moderate generalized atheromatous irregularity of intracranial vessels.  Electronically Signed   By: Jorje Guild M.D.   On: 07/25/2022 08:06   CT HEAD CODE STROKE WO CONTRAST  Result Date: 07/25/2022 CLINICAL DATA:  Code stroke.  Right-sided weakness and facial droop EXAM: CT HEAD WITHOUT CONTRAST TECHNIQUE: Contiguous axial images were obtained from the base of the skull through the vertex without intravenous contrast. RADIATION DOSE REDUCTION: This exam was performed according to the departmental dose-optimization program which includes automated exposure control, adjustment of the mA and/or kV according to patient size and/or use of iterative reconstruction technique. COMPARISON:  07/12/2022 FINDINGS: Brain: No evidence of acut generalized cerebral volume loss e infarction, hemorrhage, hydrocephalus, extra-axial collection or mass lesion/mass effect. Vascular: No hyperdense vessel or unexpected calcification. Skull: Normal. Negative for fracture or focal lesion. Sinuses/Orbits: No acute finding. Other: These results were by epic chat at the time of interpretation on 07/25/2022 at 7:46 am to provider Jentri Aye. ASPECTS [facility] Stroke Program Early CT Score) - Ganglionic level infarction (caudate, lentiform nuclei, internal capsule, insula, M1-M3 cortex): 7 - Supraganglionic infarction (M4-M6 cortex): 3 Total score (0-10 with 10 being normal): 10 IMPRESSION: 1. Stable head CT.  No acute finding. 2. ASPECTS is 10. Electronically Signed   By: Jorje Guild M.D.   On: 07/25/2022 07:46    Procedures .Critical Care  Performed by: Varney Biles, MD Authorized by: Varney Biles, MD   Critical care provider statement:    Critical care time (minutes):  80   Critical care was necessary to treat or prevent imminent or life-threatening deterioration of the following conditions:  CNS failure or compromise, endocrine crisis and metabolic crisis   Critical care was time spent personally by me on the following activities:  Development of treatment plan with patient or  surrogate, discussions with consultants, evaluation of patient's response to treatment, examination of patient, ordering and review of laboratory studies, ordering and review of radiographic studies, ordering and performing treatments and interventions, pulse oximetry, re-evaluation of patient's condition, review of old charts and obtaining history from patient or surrogate     Medications Ordered in ED Medications  lactated ringers bolus 1,000 mL (1,000 mLs  Intravenous New Bag/Given 07/25/22 0839)  ceFEPIme (MAXIPIME) 2 g in sodium chloride 0.9 % 100 mL IVPB (has no administration in time range)  metroNIDAZOLE (FLAGYL) IVPB 500 mg (has no administration in time range)  vancomycin (VANCOCIN) IVPB 1000 mg/200 mL premix (has no administration in time range)  dextrose 5 %-0.45 % sodium chloride infusion (has no administration in time range)  octreotide (SANDOSTATIN) injection 100 mcg (has no administration in time range)  dextrose 50 % solution 50 mL (50 mLs Intravenous Given by Other 07/25/22 0733)  iohexol (OMNIPAQUE) 350 MG/ML injection 60 mL (60 mLs Intravenous Contrast Given 07/25/22 0745)  dextrose 50 % solution 50 mL (50 mLs Intravenous Given 07/25/22 0908)    ED Course/ Medical Decision Making/ A&P Clinical Course as of 07/25/22 0913  Sun Jul 25, 2022  0806 Glucose(!!): 28 Patient's blood sugar confirmed 28 on i-STAT.  His CBG now is 160.  He is AOx3, following commands and able to communicate.  He is moving all 4 extremities, unlikely to be acute ischemic stroke, likely hypoglycemia is the culprit. [AN]  0910 Glucose-Capillary(!): 50 CBG dropped to 50 again.  We will give him octreotide subcu.  D5 maintenance fluid ordered. [AN]  0910 CT HEAD CODE STROKE WO CONTRAST CT head of the brain independently interpreted.  No evidence of brain bleed. Post D50, patient's mental status has improved.  I spoke with patient's fianc.  She states that patient has not been eating or drinking well for  the last several weeks and is supposed to get upper endoscopy tomorrow.  Hypoglycemia problem however is unique starting yesterday.  He had an episode of hypoglycemia yesterday, for which paramedics were called as well.  She states that patient has been having cough that is increased but no other systemic symptoms of fevers, chills. [AN]  0911 Given the ambiguity for hypoglycemia, persistent tachycardia -we have activated code sepsis.  We will give patient vancomycin and cefepime.  He is having cough, the medication should cover him for pneumonia. [AN]  [id number] Telemetry assessment evaluation is normal after sugar improved.  They recommend admission for hypoglycemia. [AN]    Clinical Course User Index [AN] Varney Biles, MD                           Medical Decision Making 71 year old male comes in with chief complaint of right-sided weakness.  He is unable to communicate, aphasia?  Severe dysarthria?  He is able to follow commands.  Patient clearly has right-sided weakness.  It appears that he has right-sided neglect and with the question of possible expressive aphasia -LVO is a possibility.   Code stroke activated.  CT angiogram head and neck along with CT scan of the brain without contrast ordered.  I requested nursing staff to hold off on the CT perfusion, as they are having difficulty with 18-gauge IV and I do not see benefit in delaying CTA for LVO screen.  Patient's blood sugar in the ER is 27.  Amp of D50 given.  Hypoglycemia likely also contributing.  Additionally, patient was recently admitted for seizure-like activity.  Differential diagnosis for this patient includes acute stroke, intracranial hemorrhage, seizure with Todd's paralysis, hypoglycemia.  Amount and/or Complexity of Data Reviewed Labs: ordered. Decision-making details documented in ED Course. Radiology: ordered. Decision-making details documented in ED Course.  Risk Prescription drug management. Decision regarding  hospitalization.    Final Clinical Impression(s) / ED Diagnoses Final diagnoses:  Hypoglycemia  Stroke-like  symptoms  Tachycardia    Rx / DC Orders ED Discharge Orders     None         Varney Biles, MD 07/25/22 5790    Varney Biles, MD 07/25/22 1303

## 2022-07-25 NOTE — ED Notes (Signed)
Blood sugar was 27mg /dl. Rechecked and resulted 27mg /dl

## 2022-07-25 NOTE — ED Notes (Signed)
CBG 27 mg/dl. Armband was not available.

## 2022-07-25 NOTE — Consult Note (Signed)
[facility] Tele[facility]urology Consult Services   Patient Name:   Colin Wagner, Colin Wagner Date of Birth:   13-Jan-1951 Identification Number:   MRN - [id number] Date of Service:   07/25/2022 07:49:37  Diagnosis:       E16.2 - Hypoglycemia, unspecified       G45.9 - Transient cerebral ischemic attack, unspecified  Impression:      71 yo man presenting for left weakness and aphasia in the setting of significant hypoglycemia. Improved with treatment of her low glucose. I have an overall low suspicion of stroke and think symptoms are rather due to the hypoglycemia itself. Agree with admission. At this time, likely does not need MRI as he is at baseline but will defer this to admitting team. Continue home antiplatelets.  Our recommendations are outlined below.  Recommendations:        Stroke/Telemetry Floor       Neuro Checks       Bedside Swallow Eval       DVT Prophylaxis       IV Fluids, Normal Saline       Head of Bed 30 Degrees       Euglycemia and Avoid Hyperthermia (PRN Acetaminophen)  Sign Out:       Discussed with Emergency Department Provider    ------------------------------------------------------------------------------  Advanced Imaging: CTA Head and Neck Completed.  LVO:No  Patient in not a candidate for NIR   Metrics: Last Known Well: 07/25/2022 00:00:00 [facility] Notification Time: 07/25/2022 07:49:37 Arrival Time: 07/25/2022 07:25:00 Stamp Time: 07/25/2022 07:49:37 Initial Response Time: 07/25/2022 07:57:12 Symptoms: weakness, aphasia. Initial patient interaction: 07/25/2022 08:04:49 NIHSS Assessment Completed: 07/25/2022 08:09:48 Patient is not a candidate for Thrombolytic. Thrombolytic Medical Decision: 07/25/2022 08:09:49 Patient was not deemed candidate for Thrombolytic because of following reasons: Last Well Known Above 4.5 Hours.  CT head showed no acute hemorrhage or acute core infarct.  Primary Provider Notified of Diagnostic Impression and  Management Plan on: 07/25/2022 08:16:35    ------------------------------------------------------------------------------  History of Present Illness: Patient is a 71 year old Male.  Patient was brought by EMS for symptoms of weakness, aphasia. This is a 71 yo man who presents for evaluation of left weakness and aphasia. He was last normal around midnight per report though this is unverified.  He apparently had an episode around 9pm last night with similar symptoms, found to be hypoglycemic, and given d50 by EMS. He improved as well as glucose level. This morning, he woke up with his symptoms, EMS called again and brought to the ER, where his blood sugar was in the 20s. He was given more d50 with improvement in symptoms. He currently says he feels baseline other than being cold.  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 I am not sure of his baseline overall functioning.    Past Medical History:      Hypertension O[place] PMH:  right carotid stenosis  lung, jaw, renal cancers    Medications:  No Anticoagulant use  Antiplatelet use: Yes aspirin, plavix Reviewed EMR for current medications  Allergies:  NKDA  Social History: Alcohol Use: Yes  Family History:  There is no family history of premature cerebrovascular disease pertinent to this consultation  ROS : 14 Points Review of Systems was performed and was negative except mentioned in HPI.  Past Surgical History: There Is No Surgical History Contributory To Today's Visit    Examination: BP(185/88), EGBTD(176),  1A: Level of Consciousness - Alert; keenly responsive + 0 1B: Ask Month and Age - Both Questions Right + 0 1C: Blink Eyes & Squeeze Hands - Performs Both Tasks + 0 2: Test Horizontal Extraocular Movements - Normal + 0 3: Test Visual Fields - No Visual Loss + 0 4: Test Facial Palsy (Use Grimace if  Obtunded) - Minor paralysis (flat nasolabial fold, smile asymmetry) + 1 5A: Test Left Arm Motor Drift - No Drift for 10 Seconds + 0 5B: Test Right Arm Motor Drift - No Drift for 10 Seconds + 0 6A: Test Left Leg Motor Drift - No Drift for 5 Seconds + 0 6B: Test Right Leg Motor Drift - No Drift for 5 Seconds + 0 7: Test Limb Ataxia (FNF/Heel-Shin) - No Ataxia + 0 8: Test Sensation - Normal; No sensory loss + 0 9: Test Language/Aphasia - Normal; No aphasia + 0 10: Test Dysarthria - Mild-Moderate Dysarthria: Slurring but can be understood + 1 11: Test Extinction/Inattention - No abnormality + 0  NIHSS Score: 2   Pre-Morbid Modified Rankin Scale: Unable to assess  Spoke with : Dr. Kathrynn Humble  Patient/Family was informed the Neurology Consult would occur via TeleHealth consult by way of interactive audio and video telecommunications and consented to receiving care in this manner.   Patient is being evaluated for possible acute neurologic impairment and high probability of imminent or life-threatening deterioration. I spent total of 30 minutes providing care to this patient, including time for face to face visit via telemedicine, review of medical records, imaging studies and discussion of findings with providers, the patient and/or family.   Dr Precious Gilding   [facility] For Inpatient follow-up with [facility] physician please call [facility] [phone]. This is not an outpatient service. Post hospital discharge, please contact hospital directly.

## 2022-07-25 NOTE — ED Notes (Signed)
Pt bed soiled Pt cleaned new gown and bed linen Pt needs  oral suction, so pt given urinal

## 2022-07-26 ENCOUNTER — Encounter (HOSPITAL_COMMUNITY)
Admission: EM | Disposition: A | Discharge status: Home / Self Care | Payer: Self-pay | Source: Home / Self Care | Attending: Family Medicine

## 2022-07-26 ENCOUNTER — Observation Stay (HOSPITAL_BASED_OUTPATIENT_CLINIC_OR_DEPARTMENT_OTHER): Payer: Medicare Other | Admitting: Anesthesiology

## 2022-07-26 ENCOUNTER — Ambulatory Visit (HOSPITAL_COMMUNITY): Admission: RE | Admit: 2022-07-26 | Payer: Medicare Other | Source: Home / Self Care | Admitting: Internal Medicine

## 2022-07-26 ENCOUNTER — Telehealth: Payer: Self-pay | Admitting: Gastroenterology

## 2022-07-26 ENCOUNTER — Encounter (HOSPITAL_COMMUNITY): Payer: Self-pay | Admitting: Family Medicine

## 2022-07-26 ENCOUNTER — Observation Stay (HOSPITAL_COMMUNITY): Payer: Medicare Other | Admitting: Anesthesiology

## 2022-07-26 DIAGNOSIS — Z794 Long term (current) use of insulin: Secondary | ICD-10-CM | POA: Diagnosis not present

## 2022-07-26 DIAGNOSIS — I6502 Occlusion and stenosis of left vertebral artery: Secondary | ICD-10-CM | POA: Diagnosis present

## 2022-07-26 DIAGNOSIS — I10 Essential (primary) hypertension: Secondary | ICD-10-CM

## 2022-07-26 DIAGNOSIS — E1151 Type 2 diabetes mellitus with diabetic peripheral angiopathy without gangrene: Secondary | ICD-10-CM | POA: Diagnosis not present

## 2022-07-26 DIAGNOSIS — G9341 Metabolic encephalopathy: Secondary | ICD-10-CM | POA: Diagnosis present

## 2022-07-26 DIAGNOSIS — K922 Gastrointestinal hemorrhage, unspecified: Secondary | ICD-10-CM | POA: Diagnosis present

## 2022-07-26 DIAGNOSIS — Z905 Acquired absence of kidney: Secondary | ICD-10-CM | POA: Diagnosis not present

## 2022-07-26 DIAGNOSIS — R1319 Other dysphagia: Secondary | ICD-10-CM

## 2022-07-26 DIAGNOSIS — Z1152 Encounter for screening for COVID-19: Secondary | ICD-10-CM | POA: Diagnosis not present

## 2022-07-26 DIAGNOSIS — E11649 Type 2 diabetes mellitus with hypoglycemia without coma: Secondary | ICD-10-CM | POA: Diagnosis present

## 2022-07-26 DIAGNOSIS — C411 Malignant neoplasm of mandible: Secondary | ICD-10-CM | POA: Diagnosis present

## 2022-07-26 DIAGNOSIS — D6489 Other specified anemias: Secondary | ICD-10-CM | POA: Diagnosis present

## 2022-07-26 DIAGNOSIS — R634 Abnormal weight loss: Secondary | ICD-10-CM

## 2022-07-26 DIAGNOSIS — R000 Tachycardia, unspecified: Secondary | ICD-10-CM | POA: Diagnosis not present

## 2022-07-26 DIAGNOSIS — E162 Hypoglycemia, unspecified: Secondary | ICD-10-CM | POA: Diagnosis present

## 2022-07-26 DIAGNOSIS — K2081 Other esophagitis with bleeding: Secondary | ICD-10-CM

## 2022-07-26 DIAGNOSIS — R627 Adult failure to thrive: Secondary | ICD-10-CM

## 2022-07-26 DIAGNOSIS — Z87891 Personal history of nicotine dependence: Secondary | ICD-10-CM

## 2022-07-26 DIAGNOSIS — C3411 Malignant neoplasm of upper lobe, right bronchus or lung: Secondary | ICD-10-CM | POA: Diagnosis present

## 2022-07-26 DIAGNOSIS — K860 Alcohol-induced chronic pancreatitis: Secondary | ICD-10-CM | POA: Diagnosis present

## 2022-07-26 DIAGNOSIS — I129 Hypertensive chronic kidney disease with stage 1 through stage 4 chronic kidney disease, or unspecified chronic kidney disease: Secondary | ICD-10-CM | POA: Diagnosis present

## 2022-07-26 DIAGNOSIS — C060 Malignant neoplasm of cheek mucosa: Secondary | ICD-10-CM | POA: Diagnosis present

## 2022-07-26 DIAGNOSIS — E1122 Type 2 diabetes mellitus with diabetic chronic kidney disease: Secondary | ICD-10-CM | POA: Diagnosis present

## 2022-07-26 DIAGNOSIS — R1314 Dysphagia, pharyngoesophageal phase: Secondary | ICD-10-CM | POA: Diagnosis present

## 2022-07-26 DIAGNOSIS — R569 Unspecified convulsions: Secondary | ICD-10-CM | POA: Diagnosis present

## 2022-07-26 DIAGNOSIS — E785 Hyperlipidemia, unspecified: Secondary | ICD-10-CM | POA: Diagnosis present

## 2022-07-26 DIAGNOSIS — Z8249 Family history of ischemic heart disease and other diseases of the circulatory system: Secondary | ICD-10-CM | POA: Diagnosis not present

## 2022-07-26 DIAGNOSIS — Z7902 Long term (current) use of antithrombotics/antiplatelets: Secondary | ICD-10-CM | POA: Diagnosis not present

## 2022-07-26 DIAGNOSIS — Z923 Personal history of irradiation: Secondary | ICD-10-CM | POA: Diagnosis not present

## 2022-07-26 DIAGNOSIS — Y842 Radiological procedure and radiotherapy as the cause of abnormal reaction of the patient, or of later complication, without mention of misadventure at the time of the procedure: Secondary | ICD-10-CM | POA: Diagnosis present

## 2022-07-26 DIAGNOSIS — J449 Chronic obstructive pulmonary disease, unspecified: Secondary | ICD-10-CM | POA: Diagnosis present

## 2022-07-26 DIAGNOSIS — R2990 Unspecified symptoms and signs involving the nervous system: Secondary | ICD-10-CM | POA: Diagnosis not present

## 2022-07-26 DIAGNOSIS — N1832 Chronic kidney disease, stage 3b: Secondary | ICD-10-CM | POA: Diagnosis present

## 2022-07-26 DIAGNOSIS — K2101 Gastro-esophageal reflux disease with esophagitis, with bleeding: Secondary | ICD-10-CM | POA: Diagnosis present

## 2022-07-26 HISTORY — PX: ESOPHAGEAL DILATION: SHX303

## 2022-07-26 HISTORY — PX: ESOPHAGEAL BRUSHING: SHX6842

## 2022-07-26 HISTORY — PX: ESOPHAGOGASTRODUODENOSCOPY (EGD) WITH PROPOFOL: SHX5813

## 2022-07-26 LAB — GLUCOSE, CAPILLARY
Glucose-Capillary: 27 mg/dL — CL (ref 70–99)
Glucose-Capillary: 232 mg/dL — ABNORMAL HIGH (ref 70–99)
Glucose-Capillary: 205 mg/dL — ABNORMAL HIGH (ref 70–99)
Glucose-Capillary: 244 mg/dL — ABNORMAL HIGH (ref 70–99)
Glucose-Capillary: 212 mg/dL — ABNORMAL HIGH (ref 70–99)
Glucose-Capillary: 253 mg/dL — ABNORMAL HIGH (ref 70–99)
Glucose-Capillary: 282 mg/dL — ABNORMAL HIGH (ref 70–99)
Glucose-Capillary: 275 mg/dL — ABNORMAL HIGH (ref 70–99)
Glucose-Capillary: 208 mg/dL — ABNORMAL HIGH (ref 70–99)

## 2022-07-26 LAB — URINE CULTURE

## 2022-07-26 LAB — KOH PREP: KOH Prep: NONE SEEN

## 2022-07-26 SURGERY — ESOPHAGOGASTRODUODENOSCOPY (EGD) WITH PROPOFOL
Anesthesia: General

## 2022-07-26 SURGERY — ESOPHAGOGASTRODUODENOSCOPY (EGD) WITH PROPOFOL
Anesthesia: Monitor Anesthesia Care

## 2022-07-26 MED ORDER — INSULIN ASPART 100 UNIT/ML IJ SOLN
0.0000 [IU] | Freq: Three times a day (TID) | INTRAMUSCULAR | Status: DC
  Administered 2022-07-27: 5 [IU] via SUBCUTANEOUS

## 2022-07-26 MED ORDER — ALBUTEROL SULFATE (2.5 MG/3ML) 0.083% IN NEBU: 2.5000 mg | INHALATION_SOLUTION | Freq: Four times a day (QID) | RESPIRATORY_TRACT | Status: DC | PRN

## 2022-07-26 MED ORDER — INSULIN ASPART 100 UNIT/ML IJ SOLN
0.0000 [IU] | Freq: Every day | INTRAMUSCULAR | Status: DC
  Administered 2022-07-26: 2 [IU] via SUBCUTANEOUS

## 2022-07-26 MED ORDER — METOPROLOL TARTRATE 5 MG/5ML IV SOLN
INTRAVENOUS | Status: AC
  Filled 2022-07-26: qty 5

## 2022-07-26 MED ORDER — PROPOFOL 10 MG/ML IV BOLUS
INTRAVENOUS | Status: DC | PRN
  Administered 2022-07-26: 20 mg via INTRAVENOUS
  Administered 2022-07-26: 80 mg via INTRAVENOUS

## 2022-07-26 MED ORDER — METOCLOPRAMIDE HCL 5 MG/ML IJ SOLN
INTRAMUSCULAR | Status: AC
  Filled 2022-07-26: qty 2

## 2022-07-26 MED ORDER — LIDOCAINE HCL (CARDIAC) PF 100 MG/5ML IV SOSY
PREFILLED_SYRINGE | INTRAVENOUS | Status: DC | PRN
  Administered 2022-07-26: 50 mg via INTRATRACHEAL

## 2022-07-26 MED ORDER — METOPROLOL TARTRATE 5 MG/5ML IV SOLN
2.5000 mg | Freq: Once | INTRAVENOUS | Status: AC
  Administered 2022-07-26: 2.5 mg via INTRAVENOUS

## 2022-07-26 MED ORDER — IPRATROPIUM-ALBUTEROL 0.5-2.5 (3) MG/3ML IN SOLN
3.0000 mL | Freq: Three times a day (TID) | RESPIRATORY_TRACT | Status: DC
  Administered 2022-07-26 – 2022-07-27 (×4): 3 mL via RESPIRATORY_TRACT
  Filled 2022-07-26 (×4): qty 3

## 2022-07-26 MED ORDER — METOPROLOL TARTRATE 50 MG PO TABS
50.0000 mg | ORAL_TABLET | Freq: Two times a day (BID) | ORAL | Status: DC
  Administered 2022-07-26 – 2022-07-27 (×2): 50 mg via ORAL
  Filled 2022-07-26 (×2): qty 1

## 2022-07-26 MED ORDER — DEXTROSE-NACL 10-0.45 % IV SOLN
INTRAVENOUS | Status: DC
  Filled 2022-07-26: qty 1000

## 2022-07-26 MED ORDER — LACTATED RINGERS IV SOLN: INTRAVENOUS | Status: DC

## 2022-07-26 MED ORDER — ENSURE ENLIVE PO LIQD
237.0000 mL | Freq: Three times a day (TID) | ORAL | Status: DC
  Filled 2022-07-26: qty 237

## 2022-07-26 MED ORDER — METOCLOPRAMIDE HCL 5 MG/ML IJ SOLN
10.0000 mg | Freq: Once | INTRAMUSCULAR | Status: AC
  Administered 2022-07-26: 10 mg via INTRAVENOUS

## 2022-07-26 NOTE — Transfer of Care (Signed)
Immediate Anesthesia Transfer of Care Note  Patient: Colin Wagner  Procedure(s) Performed: ESOPHAGOGASTRODUODENOSCOPY (EGD) WITH PROPOFOL ESOPHAGEAL DILATION ESOPHAGEAL BRUSHING  Patient Location: PACU  Anesthesia Type:General  Level of Consciousness: awake, alert , oriented, and patient cooperative  Airway & Oxygen Therapy: Patient Spontanous Breathing  Post-op Assessment: Report given to RN, Post -op Vital signs reviewed and stable, and Patient moving all extremities  Post vital signs: Reviewed and stable  Last Vitals:  Vitals Value Taken Time  BP 77/44 07/26/22 1200  Temp    Pulse    Resp 18 07/26/22 1201  SpO2    Vitals shown include unvalidated device data.  Last Pain:  Vitals:   07/26/22 1136  TempSrc:   PainSc: 0-No pain         Complications: No notable events documented.

## 2022-07-26 NOTE — Anesthesia Preprocedure Evaluation (Signed)
Anesthesia Evaluation  Patient identified by MRN, date of birth, ID band Patient awake    Reviewed: Allergy & Precautions, H&P , NPO status , Patient's Chart, lab work & pertinent test results  History of Anesthesia Complications (+) DIFFICULT AIRWAY and history of anesthetic complications  Airway Mallampati: III  TM Distance: >3 FB Neck ROM: Limited  Mouth opening: Limited Mouth Opening Comment: Squamous cell carcinoma of mandible, s/p radiation Tracheostomy?  Dental  (+) Dental Advisory Given, Missing, Poor Dentition, Chipped   Pulmonary pneumonia, resolved, COPD,  COPD inhaler, former smoker   Pulmonary exam normal breath sounds clear to auscultation       Cardiovascular hypertension, + Peripheral Vascular Disease (carotid stent)  Normal cardiovascular exam Rhythm:Regular Rate:Normal     Neuro/Psych  Headaches  negative psych ROS   GI/Hepatic PUD,GERD  Medicated,,(+)     substance abuse  alcohol useChronic alcoholic hepatitis    Endo/Other  diabetes, Poorly Controlled, Type 2, Oral Hypoglycemic Agents    Renal/GU Renal InsufficiencyRenal disease (left renal cancer)  negative genitourinary   Musculoskeletal negative musculoskeletal ROS (+)    Abdominal   Peds negative pediatric ROS (+)  Hematology  (+) Blood dyscrasia, anemia   Anesthesia Other Findings Squamous cell carcinoma of mandible  Reproductive/Obstetrics negative OB ROS                             Anesthesia Physical Anesthesia Plan  ASA: 4  Anesthesia Plan: General   Post-op Pain Management: Minimal or no pain anticipated   Induction: Intravenous  PONV Risk Score and Plan: Propofol infusion  Airway Management Planned: Nasal Cannula and Natural Airway  Additional Equipment:   Intra-op Plan:   Post-operative Plan:   Informed Consent: I have reviewed the patients History and Physical, chart, labs and discussed  the procedure including the risks, benefits and alternatives for the proposed anesthesia with the patient or authorized representative who has indicated his/her understanding and acceptance.     Dental advisory given  Plan Discussed with: CRNA and Surgeon  Anesthesia Plan Comments:         Anesthesia Quick Evaluation

## 2022-07-26 NOTE — Progress Notes (Signed)
SLP Cancellation Note  Patient Details Name: Colin Wagner MRN: [id number] DOB: 05-31-1951   Cancelled treatment:       Reason Eval/Treat Not Completed: Patient at procedure or test/unavailable (Pt NPO for EGD with potential dilation later today.) Pt known to this SLP from previous dysphagia therapy with last visit in May 2022 with failure of Pt to return to clinic. SLP will follow after EGD completed if SLP services still needed. See summary below.  Brief SLP summary: <<L partial mandibulectomy 02/2017 radiation 04/2017 MBSS with Thomasene Mohair 03/15/19 OP with DP 09/2018 Demmi Sindt saw in acute 07/31/19, saw ENT 08/07/19 Vertell Limber (last) peg removed 02/08/2020 BaSw 12/05/20 aspiration MBSS 12/15/20 with DP and OP tx 12/24/20 but did not return 12/31/21 Dr. [name] dilation EGD (noted improvement in ability to tolerate meats and pills) 01/28/22 saw ENT Dr. 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 07/20/22 [facility] OP visit and Planned for EGD 07/26/22>>  Thank you,  Genene Churn, [place]  [place] 07/26/2022, 10:00 AM

## 2022-07-26 NOTE — H&P (View-Only) (Signed)
Gastroenterology Consult   Referring Provider: Dr. Roxan Hockey  Primary Care Physician:  [name], MD Primary Gastroenterologist:  Dr. [name]   Patient ID: Jeannine Boga; [id number]; [birth year]   Admit date: 07/25/2022  LOS: 0 days   Date of Consultation: 07/26/2022  Reason for Consultation:  Dysphagia/odynophagia   History of Present Illness   Colin DITOMMASO is a 71 y.o. 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 GI has been consulted due to dysphagia, as outpatient EGD had already been scheduled for today.   In ED: glucose 27. Creatinine 2.17, lactic acid 2.3, blood cultures done with no growth thus far, WBC count 15.6, Hgb 11.1. CT head code stroke stable. CTA head/neck without acute findings (further findings below), CXR with stable masslike opacity in medial right upper lobe unchanged.   Patient had been seen as outpatient by Dr. [name] on 07/20/22 with progressive odynophagia and epigastric pain worsened postprandially. Able to tolerate only small amounts of mashed potatoes/gravy. Pantoprazole 40 mg daily. He had dilation of upper esophageal sphincter in May 2023 with improvement at that time. Concern for radiation-induced injury, possible candida, other differentials including pill-induced esophagitis. He has held his Plavix with last dose 11/28.    CT chest without contrast in Nov 2023 with fluid distention of esophagus with diffuse esophageal wall thickening.     Past Medical History:  Diagnosis Date   Alcohol abuse    Alcoholic pancreatitis 9323    admission   Chronic back pain    Chronic pancreatitis ([place])    based on ct findings 2016   COPD (chronic obstructive pulmonary disease) ([place])    Diabetes mellitus    type 2   Diverticulosis    Gastritis    GERD (gastroesophageal reflux disease)    Headache    History of radiation therapy 05/12/17- 06/22/17   Left cheek and bilateral neck/ 60 Gy in 30 fractions to gross disease   Hyperlipidemia    Hypertension    Jaw cancer ([place])    left jaw part of jaw bone removed   Peptic ulcer disease 1999   Per medical reports, no H pylori   Pneumonia    Renal cancer, left ([place]) 2012   he tells me that he has been released, ?and that he is free of cancer, and never had it to begin with.    Right shoulder pain    Testicular hypofunction     Past Surgical History:  Procedure Laterality Date   BALLOON DILATION  12/31/2021   Procedure: BALLOON DILATION;  Surgeon: [name], MD;  [facility];  Service: Endoscopy;;   BIOPSY  04/30/2022   Procedure: BIOPSY;  Surgeon: Maryjane Hurter, MD;  [facility]: [facility];  Service: Pulmonary;;   CAROTID STENT  06/2018   COLONOSCOPY  2003   Dr. [name], polyps   COLONOSCOPY  2005   Dr. [name], multiple diverticula   COLONOSCOPY  2008   Dr. [name], diverticulosis   COLONOSCOPY WITH PROPOFOL N/A 10/21/2016   Dr. [name]: Diverticulosis, two 5-7 mm polyps removed. path-tubular adenomas.  Next colonoscopy in 5 years.   COLONOSCOPY WITH  PROPOFOL N/A 12/31/2021   Procedure: COLONOSCOPY WITH PROPOFOL;  Surgeon: [name], MD;  [facility];  Service: Endoscopy;  Laterality: N/A;  8:15am   ENDOBRONCHIAL ULTRASOUND Right 04/30/2022   Procedure: ENDOBRONCHIAL ULTRASOUND;  Surgeon: Maryjane Hurter, MD;  [facility];  Service: Pulmonary;  Laterality: Right;   ESOPHAGOGASTRODUODENOSCOPY     Multiple EGDs. 1999 EGD showed gastric ulcers, no H pylori and benign biopsies performed by Dr. [name]. 2001  gastric ulcer healed. Last EGD 2005 had gastritis.   ESOPHAGOGASTRODUODENOSCOPY (EGD) WITH PROPOFOL N/A 12/31/2021   narrowing/deformity at UES s/p baloon dilation   FINE NEEDLE ASPIRATION  04/30/2022   Procedure: FINE NEEDLE ASPIRATION (FNA) LINEAR;  Surgeon: Maryjane Hurter, MD;  [facility];  Service: Pulmonary;;   IR GASTROSTOMY TUBE REMOVAL  02/08/2020   IR IMAGING GUIDED PORT INSERTION  05/26/2022   IR REPLACE G-TUBE SIMPLE WO FLUORO  03/20/2019   IR REPLACE G-TUBE SIMPLE WO FLUORO  09/21/2019   IR REPLC GASTRO/COLONIC TUBE PERCUT W/FLUORO  05/16/2018   Left partial mandibulectomy, Scapular free flap reconstruction, selective neck dissection, tracheotomy, and resection of intraoral palate cancer. Left 03/01/2017   [facility]   LUNG BIOPSY     MOUTH SURGERY     PARTIAL NEPHRECTOMY Left 2012   POLYPECTOMY  10/21/2016   Procedure: POLYPECTOMY;  Surgeon: [name], MD;  [facility];  Service: Endoscopy;;  hepatic flexure x2   POLYPECTOMY  12/31/2021   Procedure: POLYPECTOMY INTESTINAL;  Surgeon: [name], MD;  [facility];  Service: Endoscopy;;   SURGERY OF LIP  06/2019   lip revision   TRANSURETHRAL RESECTION OF PROSTATE N/A 03/01/2019   Procedure: TRANSURETHRAL RESECTION OF THE PROSTATE (TURP)WITH CYSTOSCOPY;  Surgeon: Raynelle Bring, MD;  [facility]: [facility];  Service: Urology;  Laterality: N/A;   VIDEO BRONCHOSCOPY Right 04/30/2022   Procedure: VIDEO BRONCHOSCOPY WITHOUT FLUORO;  Surgeon: Maryjane Hurter, MD;  [facility];  Service: Pulmonary;  Laterality: Right;    Prior to Admission medications   Medication Sig Start Date End Date Taking? Authorizing Provider  acetaminophen (TYLENOL) 500 MG tablet Take 1,000 mg by mouth every 6 (six) hours as needed for mild pain or moderate pain.   Yes [provider]  albuterol (VENTOLIN HFA) 108 (90 Base) MCG/ACT inhaler Inhale 2 puffs into the  lungs every 4 (four) hours as needed for wheezing or shortness of breath. 03/29/22  Yes [provider]  amLODipine (NORVASC) 10 MG tablet Take 1 tablet (10 mg total) by mouth daily. 07/14/22  Yes Eugenie Filler, MD  aspirin EC 81 MG tablet Take 81 mg by mouth daily.   Yes [provider]  atorvastatin (LIPITOR) 40 MG tablet Take 40 mg by mouth daily. 02/14/17  Yes [provider]  cholecalciferol (VITAMIN D) 25 MCG (1000 UNIT) tablet Take 1,000 Units by mouth daily.   Yes [provider]  fluconazole (DIFLUCAN) 100 MG tablet Take 100 mg by mouth daily. 07/20/22  Yes [provider]  fluticasone (FLONASE) 50 MCG/ACT nasal spray Place 2 sprays into both nostrils daily.   Yes [provider]  GARLIC PO Take 1 tablet by mouth daily.   Yes [provider]  HYDROcodone-acetaminophen (NORCO) 7.5-325 MG tablet Take 1 tablet by mouth every 6 (six) hours as needed for moderate pain. 06/30/22  Yes [provider]  LEVEMIR FLEXTOUCH 100 UNIT/ML FlexTouch Pen Inject 32 Units  into the skin every evening. 07/14/22  Yes Eugenie Filler, MD  levETIRAcetam (KEPPRA) 500 MG tablet Take 1 tablet (500 mg total) by mouth 2 (two) times daily. 07/14/22  Yes Eugenie Filler, MD  LORazepam (ATIVAN) 1 MG tablet Take 1 mg by mouth every 6 (six) hours as needed for anxiety or sleep. 03/31/15  Yes [provider]  megestrol (MEGACE) 40 MG/ML suspension Take 200 mg by mouth daily.   Yes [provider]  metoCLOPramide (REGLAN) 10 MG tablet Take 1 tablet (10 mg total) by mouth every 6 (six) hours as needed (Hiccups.). 07/14/22 07/14/23 Yes Eugenie Filler, MD  nystatin cream (MYCOSTATIN) Apply to affected area 2 times daily 06/13/22  Yes Volney American, PA-C  OVER THE COUNTER MEDICATION Take 10 mLs by mouth 2 (two) times daily. maalox   Yes [provider]  pantoprazole (PROTONIX) 40 MG tablet Take 1 tablet (40 mg  total) by mouth 2 (two) times daily before a meal. 04/06/22  Yes Rourk, Cristopher Estimable, MD  prochlorperazine (COMPAZINE) 10 MG tablet TAKE 1 TABLET(10 MG) BY MOUTH EVERY 6 HOURS AS NEEDED FOR NAUSEA OR VOMITING Patient taking differently: Take 10 mg by mouth every 6 (six) hours as needed for vomiting or nausea. 07/15/22  Yes Heilingoetter, Cassandra L, PA-C  senna (SENOKOT) 8.6 MG TABS tablet Take 1 tablet (8.6 mg total) by mouth at bedtime as needed for mild constipation. May be needed as pain medication can constipate. 01/12/17  Yes [name], MD  sildenafil (VIAGRA) 100 MG tablet Take 100 mg by mouth daily as needed for erectile dysfunction.   Yes [provider]  sucralfate (CARAFATE) 1 g tablet Take 1 tablet (1 g total) by mouth 3 (three) times daily as needed. May dissolve 1 tab and glass of water and drink as needed 06/13/22  Yes Volney American, PA-C  testosterone cypionate (DEPOTESTOSTERONE CYPIONATE) 200 MG/ML injection Inject 1.5 mLs into the muscle every 14 (fourteen) days. 05/22/22  Yes [provider]  tiZANidine (ZANAFLEX) 4 MG tablet Take 4 mg by mouth every 6 (six) hours as needed for muscle spasms. 06/30/22  Yes [provider]  umeclidinium-vilanterol (ANORO ELLIPTA) 62.5-25 MCG/ACT AEPB Inhale 1 puff into the lungs daily. 05/19/22  Yes Spero Geralds, MD  clopidogrel (PLAVIX) 75 MG tablet Take 75 mg by mouth daily. 05/27/19   [provider]  ferrous sulfate 325 (65 FE) MG tablet Take 325 mg by mouth daily with breakfast.    [provider]  glipiZIDE (GLUCOTROL XL) 2.5 MG 24 hr tablet Take 2.5 mg by mouth every morning. 08/11/18   [provider]  tadalafil (CIALIS) 20 MG tablet Take 1 tablet (20 mg total) by mouth daily as needed for erectile dysfunction. Take 30-60 minutes prior to intercourse Patient not taking: Reported on 07/25/2022 11/19/21   Primus Bravo., MD    Current Facility-Administered Medications   Medication Dose Route Frequency Provider Last Rate Last Admin   0.9 %  sodium chloride infusion   Intravenous PRN Denton Brick, Courage, MD       acetaminophen (TYLENOL) tablet 650 mg  650 mg Oral Q6H PRN Emokpae, Courage, MD       Or   acetaminophen (TYLENOL) suppository 650 mg  650 mg Rectal Q6H PRN Emokpae, Courage, MD       albuterol (PROVENTIL) (2.5 MG/3ML) 0.083% nebulizer solution 2.5 mg  2.5 mg Nebulization Q6H PRN Roxan Hockey, MD       alum &  mag hydroxide-simeth (MAALOX/MYLANTA) 200-200-20 MG/5ML suspension 15 mL  15 mL Oral Q6H PRN Emokpae, Courage, MD   15 mL at 07/25/22 2345   amLODipine (NORVASC) tablet 10 mg  10 mg Oral Daily Emokpae, Courage, MD       aspirin EC tablet 81 mg  81 mg Oral Daily Emokpae, Courage, MD   81 mg at 07/25/22 1754   atorvastatin (LIPITOR) tablet 40 mg  40 mg Oral QHS Emokpae, Courage, MD   40 mg at 07/25/22 2343   bisacodyl (DULCOLAX) suppository 10 mg  10 mg Rectal Daily PRN Emokpae, Courage, MD       dextrose 10 % and 0.45 % NaCl infusion   Intravenous Continuous Emokpae, Courage, MD 125 mL/hr at 07/26/22 0844 New Bag at 07/26/22 0844   dextrose 50 % solution 50 mL  1 ampule Intravenous Once Emokpae, Courage, MD       feeding supplement (ENSURE ENLIVE / ENSURE PLUS) liquid 237 mL  237 mL Oral TID Denton Brick, Courage, MD   237 mL at 07/25/22 2238   fentaNYL (SUBLIMAZE) injection 25 mcg  25 mcg Intravenous Q2H PRN Roxan Hockey, MD   25 mcg at 07/25/22 1241   fluconazole (DIFLUCAN) tablet 100 mg  100 mg Oral Daily Emokpae, Courage, MD   100 mg at 07/25/22 1755   fluticasone (FLONASE) 50 MCG/ACT nasal spray 2 spray  2 spray Each Nare Daily Emokpae, Courage, MD   2 spray at 16/10/96 0454   folic acid (FOLVITE) tablet 1 mg  1 mg Oral Daily Emokpae, Courage, MD   1 mg at 07/25/22 1755   heparin injection 5,000 Units  5,000 Units Subcutaneous Q8H Emokpae, Courage, MD       HYDROcodone-acetaminophen (NORCO/VICODIN) 5-325 MG per tablet 1.5 tablet  1.5 tablet Oral  Q6H PRN Denton Brick, Courage, MD   1.5 tablet at 07/25/22 1811   ipratropium-albuterol (DUONEB) 0.5-2.5 (3) MG/3ML nebulizer solution 3 mL  3 mL Nebulization TID Roxan Hockey, MD   3 mL at 07/26/22 0907   labetalol (NORMODYNE) injection 10 mg  10 mg Intravenous Q4H PRN Roxan Hockey, MD       levETIRAcetam (KEPPRA) tablet 500 mg  500 mg Oral BID Denton Brick, Courage, MD   500 mg at 07/25/22 2344   LORazepam (ATIVAN) tablet 1 mg  1 mg Oral Q6H PRN Roxan Hockey, MD   1 mg at 07/25/22 2343   megestrol (MEGACE) 40 MG/ML suspension 200 mg  200 mg Oral Daily Emokpae, Courage, MD       multivitamin liquid 15 mL  15 mL Oral Daily Emokpae, Courage, MD       ondansetron (ZOFRAN) tablet 4 mg  4 mg Oral Q6H PRN Emokpae, Courage, MD       Or   ondansetron (ZOFRAN) injection 4 mg  4 mg Intravenous Q6H PRN Emokpae, Courage, MD       pantoprazole (PROTONIX) EC tablet 40 mg  40 mg Oral BID AC Emokpae, Courage, MD   40 mg at 07/25/22 1755   polyethylene glycol (MIRALAX / GLYCOLAX) packet 17 g  17 g Oral Daily PRN Emokpae, Courage, MD       senna (SENOKOT) tablet 17.2 mg  2 tablet Oral QHS Emokpae, Courage, MD   17.2 mg at 07/25/22 2343   sodium chloride flush (NS) 0.9 % injection 3 mL  3 mL Intravenous Q12H Emokpae, Courage, MD       sodium chloride flush (NS) 0.9 % injection 3 mL  3 mL Intravenous  Q12H Roxan Hockey, MD   3 mL at 07/25/22 2344   sodium chloride flush (NS) 0.9 % injection 3 mL  3 mL Intravenous PRN Emokpae, Courage, MD       sucralfate (CARAFATE) tablet 1 g  1 g Oral TID WC & HS Emokpae, Courage, MD   1 g at 07/25/22 2344   thiamine (VITAMIN B1) tablet 100 mg  100 mg Oral Daily Emokpae, Courage, MD   100 mg at 07/25/22 1755   tiZANidine (ZANAFLEX) tablet 4 mg  4 mg Oral Q6H PRN Emokpae, Courage, MD       traZODone (DESYREL) tablet 50 mg  50 mg Oral QHS PRN Emokpae, Courage, MD       umeclidinium-vilanterol (ANORO ELLIPTA) 62.5-25 MCG/ACT 1 puff  1 puff Inhalation Daily Emokpae, Courage, MD    1 puff at 07/26/22 0915    Allergies as of 07/25/2022 - Review Complete 07/25/2022  Allergen Reaction Noted   Trazodone Other (See Comments) 07/25/2022    Family History  Problem Relation Age of Onset   Hypertension Mother    Colon cancer Neg Hx     Social History   Socioeconomic History   Marital status: Divorced    Spouse name: Not on file   Number of children: 0   Years of education: Not on file   Highest education level: Not on file  Occupational History   Not on file  Tobacco Use   Smoking status: Former    Packs/day: 0.50    Years: 35.00    Total pack years: 17.50    Types: Cigarettes    Quit date: 03/01/2017    Years since quitting: 5.4   Smokeless tobacco: Never   Tobacco comments:    quit 02-2017  Vaping Use   Vaping Use: Never used  Substance and Sexual Activity   Alcohol use: No    Comment: quit 2000 but relapse in 2016. no etoh since hospitalized 2016.    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   Patient with a history of smoking half pack a day for 35+ years. Patient currently only smoking a few cigarettes per day.   The patient has history of alcohol abuse. Patient quit in 2000 and then relapsed in 2016. Patient has not had any alcohol since 2016.   Social Determinants of Health   Financial Resource Strain: High Risk (06/03/2022)   Overall Financial Resource Strain (CARDIA)    Difficulty of Paying Living Expenses: Very hard  Food Insecurity: No Food Insecurity (07/26/2022)   Hunger Vital Sign    Worried About Running Out of Food in the Last Year: Never true    Ran Out of Food in the Last Year: Never true  Transportation Needs: No Transportation Needs (07/26/2022)   PRAPARE - Hydrologist (Medical): No    Lack of Transportation (Non-Medical): No  Physical Activity: Inactive (07/27/2019)    Exercise Vital Sign    Days of Exercise per Week: 0 days    Minutes of Exercise per Session: 0 min  Stress: No Stress Concern Present (07/27/2019)   [place]    Feeling of Stress : Not at all  Social Connections: Unknown (07/27/2019)   Social Connection and Isolation Panel [NHANES]    Frequency of Communication  with Friends and Family: More than three times a week    Frequency of Social Gatherings with Friends and Family: More than three times a week    Attends Religious Services: More than 4 times per year    Active Member of [employer] or Organizations: Yes    Attends Music therapist: More than 4 times per year    Marital Status: Not on file  Intimate Partner Violence: Not At Risk (07/26/2022)   Humiliation, Afraid, Rape, and Kick questionnaire    Fear of Current or Ex-Partner: No    Emotionally Abused: No    Physically Abused: No    Sexually Abused: No     Review of Systems   Gen: see HPI CV: Denies chest pain, heart palpitations, syncope, edema  Resp: Denies shortness of breath with rest, cough, wheezing, coughing up blood, and pleurisy. GI: see HPI GU : Denies urinary burning, blood in urine, urinary frequency, and urinary incontinence. MS: Denies joint pain, limitation of movement, swelling, cramps, and atrophy.  Derm: Denies rash, itching, dry skin, hives. Psych: Denies depression, anxiety, memory loss, hallucinations, and confusion. Heme: Denies bruising or bleeding Neuro:  Denies any headaches, dizziness, paresthesias, shaking  Physical Exam   Vital Signs in last 24 hours: Temp:  [97.6 F (36.4 C)-98.2 F (36.8 C)] 98.2 F (36.8 C) (12/04 0620) Pulse Rate:  [96-109] 101 (12/04 0620) Resp:  [16-22] 16 (12/03 2313) BP: (104-156)/(48-86) 111/64 (12/04 0620) SpO2:  [93 %-100 %] 99 % (12/04 0915)    General:   Alert,  Well-developed, well-nourished, pleasant and cooperative in NAD Head:   Normocephalic and atraumatic. Eyes:  Sclera clear, no icterus.  Ears:  Normal auditory acuity. Lungs:  Clear throughout to auscultation.    Heart:  S1 S2 present  Abdomen:  Soft, mild TTP epigastric and nondistended. Normal bowel sounds, without guarding, and without rebound.   Rectal: deferred   Msk:  Symmetrical without gross deformities. Normal posture. Extremities:  Without  edema. Neurologic:  Alert and  oriented x4. Skin:  Intact without significant lesions or rashes. Psych:  Alert and cooperative. Normal mood and affect.  Intake/Output from previous day: 12/03 0701 - 12/04 0700 In: 2528.7 [I.V.:1054.3; IV Piggyback:1474.4] Out: 1325 [Urine:1325] Intake/Output this shift: Total I/O In: 0  Out: 200 [Urine:200]   Labs/Studies   Recent Labs Recent Labs    07/25/22 0738 07/25/22 0743  WBC 15.6*  --   HGB 11.1* 12.6*  HCT 33.8* 37.0*  PLT 398  --    BMET Recent Labs    07/23/22 1247 07/25/22 0738 07/25/22 0743  NA 131* 135 134*  K 4.4 4.1 3.8  CL 96* 97* 97*  CO2 23 27  --   GLUCOSE 55* 33* 28*  BUN 33* 40* 34*  CREATININE 1.92* 2.17* 2.10*  CALCIUM 9.7 9.9  --    LFT Recent Labs    07/25/22 0738  PROT 8.2*  ALBUMIN 3.8  AST 55*  ALT 23  ALKPHOS 87  BILITOT 0.1*   PT/INR Recent Labs    07/25/22 0738  LABPROT 14.9  INR 1.2     Radiology/Studies DG Chest Port 1 View  Result Date: 07/25/2022 CLINICAL DATA:  Low blood sugar last night. EXAM: PORTABLE CHEST 1 VIEW COMPARISON:  July 12 2022, chest CT July 10, 2022 FINDINGS: The heart size and mediastinal contours are stable. Stable masslike opacity is identified in the medial right upper lobe/right paratracheal region unchanged. Left central venous lines tendons at distal  tip in the superior vena cava unchanged. Both lungs are otherwise clear. The visualized skeletal structures are unremarkable. IMPRESSION: Stable masslike opacity is identified in the medial right upper lobe/right  paratracheal region unchanged. Lungs are otherwise clear. Electronically Signed   By: Abelardo Diesel M.D.   On: 07/25/2022 08:57   CT ANGIO HEAD NECK W WO CM (CODE STROKE)  Result Date: 07/25/2022 CLINICAL DATA:  Right-sided weakness with facial droop. Stroke suspected EXAM: CT ANGIOGRAPHY HEAD AND NECK TECHNIQUE: Multidetector CT imaging of the head and neck was performed using the standard protocol during bolus administration of intravenous contrast. Multiplanar CT image reconstructions and MIPs were obtained to evaluate the vascular anatomy. Carotid stenosis measurements (when applicable) are obtained utilizing NASCET criteria, using the distal internal carotid diameter as the denominator. RADIATION DOSE REDUCTION: This exam was performed according to the departmental dose-optimization program which includes automated exposure control, adjustment of the mA and/or kV according to patient size and/or use of iterative reconstruction technique. CONTRAST:  23mL OMNIPAQUE IOHEXOL 350 MG/ML SOLN COMPARISON:  Head CT from today.  Intracranial MRA 07/12/2022 FINDINGS: CTA NECK FINDINGS Aortic arch: Atherosclerosis with irregular plaque. Two vessel branching. Right carotid system: Atheromatous plaque with stent spanning the distal common carotid to proximal ICA. No in stent stenosis. Left carotid system: Atheromatous wall thickening diffusely with intermittent calcification. No stenosis or ulceration. Vertebral arteries: Proximal subclavian atherosclerosis with advanced low-density plaque at the left vertebral origin. The left vertebral artery is nonenhancing essentially throughout its course. The right vertebral artery is patent with mild atheromatous narrowing at its origin. Distal right vertebral fenestration at the level of C1. Skeleton: Left mandibular fracture/osteotomy fixation without bony bridging but also no sign of mandibular hardware loosening. Generalized cervical spine degeneration. Other neck:  Postoperative left lateral neck and face with flap no aggressive finding. Left mastoid and middle ear opacification, chronic and possibly from prior radiotherapy. Upper chest: Known right upper lobe mass. Review of the MIP images confirms the above findings CTA HEAD FINDINGS Anterior circulation: Atheromatous plaque along the carotid siphons. Moderate diffuse atheromatous irregularity of MCA branches. No branch occlusion or aneurysm. Posterior circulation: Diseased left vertebral artery. The basilar is primarily filled from the right vertebral artery. Proximal basilar fenestration. Atheromatous irregularity of the basilar which is diminutive due to fetal type bilateral PCA flow. Moderate atheromatous irregularity of the posterior cerebral arteries. Venous sinuses: Negative Anatomic variants: As above Review of the MIP images confirms the above findings IMPRESSION: 1. No emergent finding. 2. 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 Stented right cervical carotid which is widely patent. 4. Moderate generalized atheromatous irregularity of intracranial vessels. Electronically Signed   By: Jorje Guild M.D.   On: 07/25/2022 08:06   CT HEAD CODE STROKE WO CONTRAST  Result Date: 07/25/2022 CLINICAL DATA:  Code stroke.  Right-sided weakness and facial droop EXAM: CT HEAD WITHOUT CONTRAST TECHNIQUE: Contiguous axial images were obtained from the base of the skull through the vertex without intravenous contrast. RADIATION DOSE REDUCTION: This exam was performed according to the departmental dose-optimization program which includes automated exposure control, adjustment of the mA and/or kV according to patient size and/or use of iterative reconstruction technique. COMPARISON:  07/12/2022 FINDINGS: Brain: No evidence of acut generalized cerebral volume loss e infarction, hemorrhage, hydrocephalus, extra-axial collection or mass lesion/mass effect.  Vascular: No hyperdense vessel or unexpected calcification. Skull: Normal. Negative for fracture or focal lesion. Sinuses/Orbits: No acute  finding. Other: These results were by epic chat at the time of interpretation on 07/25/2022 at 7:46 am to provider ANKIT NANAVATI. ASPECTS [facility] Stroke Program Early CT Score) - Ganglionic level infarction (caudate, lentiform nuclei, internal capsule, insula, M1-M3 cortex): 7 - Supraganglionic infarction (M4-M6 cortex): 3 Total score (0-10 with 10 being normal): 10 IMPRESSION: 1. Stable head CT.  No acute finding. 2. ASPECTS is 10. Electronically Signed   By: Jorje Guild M.D.   On: 07/25/2022 07:46     Assessment   COURT Wagner is a 71 y.o. 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 GI has been consulted due to dysphagia, as outpatient EGD had already been scheduled for today.   Dysphagia/odynophagia: previously responding well to dilation of upper esophageal sphincter in May 2023. Now with progressive dysphagia/odynophagia with differentials including radiation-induced injury, Candida, pill-induced esophagitis. Also noting epigastric pain postprandially. If epigastric pain continues, may need abdominal imaging. He has held Plavix with last dose 11/28. Plans had been for outpatient EGD/dilation actually today; however, as he is now inpatient, will still pursue today by Dr Abbey Chatters.     Plan / Recommendations    Remain NPO PPI BID Proceed with upper endoscopy/dilation by Dr. Abbey Chatters today: the risks, benefits, and alternatives have been discussed with  the patient in detail. The patient states understanding and desires to proceed.  Plavix remains on hold. Last dose was 11/28 Further recommendations to follow      07/26/2022, 9:49 AM  Annitta Needs, PhD, ANP-BC [facility]

## 2022-07-26 NOTE — TOC Progression Note (Signed)
   Transition of Care [facility]) Screening Note   Patient Details  Name: Colin Wagner Date of Birth: January 31, 1951   Transition of Care [facility]) CM/SW Contact:    Boneta Lucks, RN Phone Number: 07/26/2022, 10:22 AM  Continue work up - other consults pending  Transition of Care Department [facility]) has reviewed patient and no TOC needs have been identified at this time. We will continue to monitor patient advancement through interdisciplinary progression rounds. If new patient transition needs arise, please place a TOC consult.     Barriers to Discharge: Continued Medical Work up

## 2022-07-26 NOTE — Progress Notes (Signed)
PROGRESS NOTE     Colin Wagner, is a 71 y.o. male, DOB - 1951/03/07, [name]:[id number]  Admit date - 07/25/2022   Admitting Physician Haseeb Fiallos Denton Brick, MD  Outpatient Primary MD for the patient is [name], MD  LOS - 0  Chief Complaint  Patient presents with   Code Stroke        Brief Narrative:  71 y.o. 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 Cells for cytology obtained, - Normal stomach. Normal duodenal bulb, first portion of the duodenum and second portion of the duodenum ---as of 07/26/22---EGD was done w/o dilation bcos of esophageal bleeding... -c/n Protonix and Carafate  2)Recurrent Hypoglycemia--- -Please note that EMS was called to patient's residence around 9 PM on 07/24/22 for an episode of low sugar patient received D50 IV at the time and patient refused transportation to the hospital-- -woke up am of 07/25/22 with another episode of low blood sugar -ound to have Blood glucose of 28, repeat glucose is 27 -Patient took Levemir insulin 32 units in pm of 07/24/22, and took Glipizide 2.5 mg in am of 07/25/22--- patient has not been eating or drinking well due to dysphagia -Give boost/Ensure -Hypoglycemia persisted with D5 solution after initially giving D50 IV -Switched to D10 continuous infusion on 07/25/22 -A1C 9.4 -07/13/2022 reflecting uncontrolled diabetes with hyperglycemia --as of 07/26/22---EGD was done w/o dilation bcos of esophageal bleeding... 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   4) history of Lt renal cancer--- states was treated and told that cancer is in remission   5)Primary malignant neoplasm right upper lobe - --Chest x-ray today showed Stable masslike opacity is identified in the medial right upper lobe/right paratracheal region unchanged. Lungs are otherwise clear.  Patient will need to follow-up with pulmonology and oncology as outpatient as advised   6)GERD/PUD--- history of EtOH abuse and chronic pancreatitis -Continue Protonix and carafate --as of 07/26/22---EGD was done w/o dilation bcos of esophageal bleeding...   7)COPD--- no acute exacerbation, continue bronchodilators   8)HTN--continue amlodipine -IV labetalol as needed elevated BP   9)PAD--- CTA head shows chronic occlusion of the left vertebral artery at the level of a severe left subclavian plaque with stenosis,  Stented right cervical carotid which is widely patent. And  Moderate generalized atheromatous irregularity of intracranial vessels. -Hold Plavix to allow for EGD dissection -Continue aspirin and Lipitor   10)History of seizure disorders--- continue Keppra, lorazepam as needed   11)Altered mentation/acute metabolic encephalopathy----Neurology was consulted in the ED initially due to concerns about possible stroke--- in retrospect altered mentation was hypoglycemia driven -CT head and CTA head as noted above without acute stroke -Presentation not consistent with sepsis okay to discontinue antibiotics Mentation is back to baseline according to girlfriend at bedside   12)CKD stage -  3B -Renal function is close to  baseline -- renally adjust medications, avoid nephrotoxic agents / dehydration  / hypotension   13) chronic anemia--- multi- factorial, Hgb close to baseline continue to monitor no bleeding concerns at this time   14)Social/Ethics--patient with multiple comorbidities -Prognosis is overall guarded - palliative care consult requested   Dispo: The patient is from: Home              Anticipated d/c is to: Home              Anticipated d/c date is: 1 day              Patient currently is not medically stable to d/c. Barriers: Not Clinically Stable-   Code Status :  -  Code Status: Full Code   Family Communication:   Discussed with Girlfriend at bedside  DVT Prophylaxis  :   - SCDs  heparin injection 5,000 Units Start: 07/26/22 2200 SCDs Start: 07/25/22 1657 Place TED hose Start: 07/25/22 1657   Lab Results  Component Value Date   PLT 398 07/25/2022   Inpatient Medications  Scheduled Meds:  amLODipine  10 mg Oral Daily   aspirin EC  81 mg Oral Daily   atorvastatin  40 mg Oral QHS   dextrose  1 ampule Intravenous Once   feeding supplement  237 mL Oral TID   fluconazole  100 mg Oral Daily   fluticasone  2 spray Each Nare Daily   folic acid  1 mg Oral Daily   heparin  5,000 Units Subcutaneous Q8H   [START ON 07/27/2022] insulin aspart  0-15 Units Subcutaneous TID WC   insulin aspart  0-5 Units Subcutaneous QHS   ipratropium-albuterol  3 mL Nebulization TID   levETIRAcetam  500 mg Oral BID   megestrol  200 mg Oral Daily   metoprolol tartrate       metoprolol tartrate  50 mg Oral BID   multivitamin  15 mL Oral Daily   pantoprazole  40 mg Oral BID AC   senna  2 tablet Oral QHS   sodium chloride flush  3 mL Intravenous Q12H   sodium chloride flush  3 mL Intravenous Q12H   sucralfate  1 g Oral TID WC & HS   thiamine  100 mg Oral Daily   umeclidinium-vilanterol  1 puff Inhalation Daily   Continuous Infusions:  sodium chloride     dextrose 10 % and 0.45 % NaCl     PRN  Meds:.sodium chloride, acetaminophen **OR** acetaminophen, albuterol, alum & mag hydroxide-simeth, bisacodyl, fentaNYL (SUBLIMAZE) injection, HYDROcodone-acetaminophen, labetalol, LORazepam, metoprolol tartrate, ondansetron **OR** ondansetron (ZOFRAN) IV, polyethylene glycol, sodium chloride flush, tiZANidine, traZODone   Anti-infectives (From admission, onward)    Start     Dose/Rate Route Frequency Ordered Stop   07/26/22 1000  vancomycin (VANCOCIN) IVPB 1000 mg/200 mL premix  Status:  Discontinued        1,000 mg 200 mL/hr over 60 Minutes Intravenous Every 24 hours 07/25/22 0957 07/25/22 1740   07/25/22 2200  ceFEPIme (MAXIPIME) 2 g in sodium chloride 0.9 % 100 mL IVPB  Status:  Discontinued        2 g 200 mL/hr over 30 Minutes Intravenous Every 12 hours 07/25/22 0957 07/25/22 1740   07/25/22 1700  fluconazole (DIFLUCAN) tablet 100 mg        100 mg Oral Daily 07/25/22 1658     07/25/22 1000  vancomycin (VANCOREADY) IVPB 1500 mg/300 mL  1,500 mg 150 mL/hr over 120 Minutes Intravenous STAT 07/25/22 0956 07/25/22 1332   07/25/22 0900  ceFEPIme (MAXIPIME) 2 g in sodium chloride 0.9 % 100 mL IVPB        2 g 200 mL/hr over 30 Minutes Intravenous  Once 07/25/22 0848 07/25/22 0939   07/25/22 0900  metroNIDAZOLE (FLAGYL) IVPB 500 mg        500 mg 100 mL/hr over 60 Minutes Intravenous  Once 07/25/22 0848 07/25/22 1055   07/25/22 0900  vancomycin (VANCOCIN) IVPB 1000 mg/200 mL premix  Status:  Discontinued        1,000 mg 200 mL/hr over 60 Minutes Intravenous  Once 07/25/22 0848 07/25/22 1950       Subjective: Ileana Roup today has no fevers, no emesis,  No chest pain,    Tolerating soft diet... -- Girlfriend at bedside - Objective: Vitals:   07/26/22 1253 07/26/22 1339 07/26/22 1446 07/26/22 1810  BP: (!) 160/86 (!) 155/86  (!) 158/73  Pulse: (!) 102 (!) 105  (!) 115  Resp: 20 16  18   Temp:  (!) 97.4 F (36.3 C)  98.2 F (36.8 C)  TempSrc:  Oral    SpO2: 98%  96% 98%   Weight:      Height:        Intake/Output Summary (Last 24 hours) at 07/26/2022 1822 Last data filed at 07/26/2022 1419 Gross per 24 hour  Intake 1655.54 ml  Output 1675 ml  Net -19.46 ml   Filed Weights   07/25/22 0804 07/25/22 0809  Weight: 77.1 kg 78 kg    Physical Exam General appearance -more awake, more interactive, no acute distress Mental status - alert, oriented to person, place, and time,  HEENT-chronic facial asymmetry due to prior jaw surgery and radiation Eyes - sclera anicteric Neck - supple, no JVD elevation , Chest --fair air movement, no wheezing  heart - S1 and S2 normal, regular  Abdomen - soft, nontender, nondistended, +BS Neurological - screening mental status exam normal, neck supple without rigidity, cranial nerves II through XII intact, DTR's normal and symmetric Extremities - no pedal edema noted, intact peripheral pulses  Skin - warm, dry  Data Reviewed: I have personally reviewed following labs and imaging studies  CBC: Recent Labs  Lab 07/25/22 0738 07/25/22 0743  WBC 15.6*  --   NEUTROABS 13.8*  --   HGB 11.1* 12.6*  HCT 33.8* 37.0*  MCV 81.3  --   PLT 398  --    Basic Metabolic Panel: Recent Labs  Lab 07/23/22 1247 07/25/22 0738 07/25/22 0743  NA 131* 135 134*  K 4.4 4.1 3.8  CL 96* 97* 97*  CO2 23 27  --   GLUCOSE 55* 33* 28*  BUN 33* 40* 34*  CREATININE 1.92* 2.17* 2.10*  CALCIUM 9.7 9.9  --    GFR: Estimated Creatinine Clearance: 33.9 mL/min (A) (by C-G formula based on SCr of 2.1 mg/dL (H)). Liver Function Tests: Recent Labs  Lab 07/25/22 0738  AST 55*  ALT 23  ALKPHOS 87  BILITOT 0.1*  PROT 8.2*  ALBUMIN 3.8   Recent Results (from the past 240 hour(s))  Blood Culture (routine x 2)     Status: None (Preliminary result)   Collection Time: 07/25/22  8:08 AM   Specimen: Right Antecubital; Blood  Result Value Ref Range Status   Specimen Description   Final    RIGHT ANTECUBITAL BOTTLES DRAWN AEROBIC AND  ANAEROBIC   Special Requests Blood Culture  adequate volume  Final   Culture   Final    NO GROWTH < 24 HOURS Performed at [facility], [address], [place], [place] [address]    Report Status PENDING  Incomplete  Blood Culture (routine x 2)     Status: None (Preliminary result)   Collection Time: 07/25/22  8:13 AM   Specimen: BLOOD RIGHT HAND  Result Value Ref Range Status   Specimen Description   Final    BLOOD RIGHT HAND BOTTLES DRAWN AEROBIC AND ANAEROBIC   Special Requests   Final    Blood Culture results may not be optimal due to an excessive volume of blood received in culture bottles   Culture   Final    NO GROWTH < 24 HOURS Performed at [facility], [address] [place], [place] [address]    Report Status PENDING  Incomplete  Resp Panel by RT-PCR (Flu A&B, Covid) Urine, Clean Catch     Status: None   Collection Time: 07/25/22  8:29 AM   Specimen: Urine, Clean Catch; Nasal Swab  Result Value Ref Range Status   SARS Coronavirus 2 by RT PCR NEGATIVE NEGATIVE Final    Comment: (NOTE) SARS-CoV-2 target nucleic acids are NOT DETECTED.  The SARS-CoV-2 RNA is generally detectable in upper respiratory specimens during the acute phase of infection. The lowest concentration of SARS-CoV-2 viral copies this assay can detect is 138 copies/mL. A negative result does not preclude SARS-Cov-2 infection and should not be used as the sole basis for treatment or other patient management decisions. A negative result may occur with  improper specimen collection/handling, submission of specimen other than nasopharyngeal swab, presence of viral mutation(s) within the areas targeted by this assay, and inadequate number of viral copies(<138 copies/mL). A negative result must be combined with clinical observations, patient history, and epidemiological information. The expected result is Negative.  Fact Sheet for Patients:  EntrepreneurPulse.com.au  Fact Sheet for  Healthcare Providers:  IncredibleEmployment.be  This test is no t yet approved or cleared by the [place] FDA and  has been authorized for detection and/or diagnosis of SARS-CoV-2 by FDA under an Emergency Use Authorization (EUA). This EUA will remain  in effect (meaning this test can be used) for the duration of the COVID-19 declaration under Section 564(b)(1) of the Act, 21 U.S.C.section 360bbb-3(b)(1), unless the authorization is terminated  or revoked sooner.       Influenza A by PCR NEGATIVE NEGATIVE Final   Influenza B by PCR NEGATIVE NEGATIVE Final    Comment: (NOTE) The Xpert Xpress SARS-CoV-2/FLU/RSV plus assay is intended as an aid in the diagnosis of influenza from Nasopharyngeal swab specimens and should not be used as a sole basis for treatment. Nasal washings and aspirates are unacceptable for Xpert Xpress SARS-CoV-2/FLU/RSV testing.  Fact Sheet for Patients: EntrepreneurPulse.com.au  Fact Sheet for Healthcare Providers: IncredibleEmployment.be  This test is not yet approved or cleared by the [place] FDA and has been authorized for detection and/or diagnosis of SARS-CoV-2 by FDA under an Emergency Use Authorization (EUA). This EUA will remain in effect (meaning this test can be used) for the duration of the COVID-19 declaration under Section 564(b)(1) of the Act, 21 U.S.C. section 360bbb-3(b)(1), unless the authorization is terminated or revoked.  Performed at [facility], [address] [place],  [address]   Urine Culture     Status: Abnormal   Collection Time: 07/25/22  8:29 AM   Specimen: Urine, Catheterized  Result Value Ref Range Status  Specimen Description   Final    URINE, CATHETERIZED Performed at [facility], [address] [place], [place] [address]    Special Requests   Final    NONE Performed at [facility], [address] [place], [place] [address]    Culture  MULTIPLE SPECIES PRESENT, SUGGEST RECOLLECTION (A)  Final   Report Status 07/26/2022 FINAL  Final  KOH prep     Status: None   Collection Time: 07/26/22 11:53 AM   Specimen: PATH Cytology brushing; Body Fluid  Result Value Ref Range Status   Specimen Description ESOPHAGUS  Final   Special Requests NONE  Final   KOH Prep   Final    NO YEAST OR FUNGAL ELEMENTS SEEN Performed at [facility], [address], [place], [place] [address]    Report Status 07/26/2022 FINAL  Final    Radiology Studies: [facility] Chest Port 1 View  Result Date: 07/25/2022 CLINICAL DATA:  Low blood sugar last night. EXAM: PORTABLE CHEST 1 VIEW COMPARISON:  July 12 2022, chest CT July 10, 2022 FINDINGS: The heart size and mediastinal contours are stable. Stable masslike opacity is identified in the medial right upper lobe/right paratracheal region unchanged. Left central venous lines tendons at distal tip in the superior vena cava unchanged. Both lungs are otherwise clear. The visualized skeletal structures are unremarkable. IMPRESSION: Stable masslike opacity is identified in the medial right upper lobe/right paratracheal region unchanged. Lungs are otherwise clear. Electronically Signed   By: Abelardo Diesel M.D.   On: 07/25/2022 08:57   CT ANGIO HEAD NECK W WO CM (CODE STROKE)  Result Date: 07/25/2022 CLINICAL DATA:  Right-sided weakness with facial droop. Stroke suspected EXAM: CT ANGIOGRAPHY HEAD AND NECK TECHNIQUE: Multidetector CT imaging of the head and neck was performed using the standard protocol during bolus administration of intravenous contrast. Multiplanar CT image reconstructions and MIPs were obtained to evaluate the vascular anatomy. Carotid stenosis measurements (when applicable) are obtained utilizing NASCET criteria, using the distal internal carotid diameter as the denominator. RADIATION DOSE REDUCTION: This exam was performed according to the departmental dose-optimization program which includes  automated exposure control, adjustment of the mA and/or kV according to patient size and/or use of iterative reconstruction technique. CONTRAST:  40mL OMNIPAQUE IOHEXOL 350 MG/ML SOLN COMPARISON:  Head CT from today.  Intracranial MRA 07/12/2022 FINDINGS: CTA NECK FINDINGS Aortic arch: Atherosclerosis with irregular plaque. Two vessel branching. Right carotid system: Atheromatous plaque with stent spanning the distal common carotid to proximal ICA. No in stent stenosis. Left carotid system: Atheromatous wall thickening diffusely with intermittent calcification. No stenosis or ulceration. Vertebral arteries: Proximal subclavian atherosclerosis with advanced low-density plaque at the left vertebral origin. The left vertebral artery is nonenhancing essentially throughout its course. The right vertebral artery is patent with mild atheromatous narrowing at its origin. Distal right vertebral fenestration at the level of C1. Skeleton: Left mandibular fracture/osteotomy fixation without bony bridging but also no sign of mandibular hardware loosening. Generalized cervical spine degeneration. Other neck: Postoperative left lateral neck and face with flap no aggressive finding. Left mastoid and middle ear opacification, chronic and possibly from prior radiotherapy. Upper chest: Known right upper lobe mass. Review of the MIP images confirms the above findings CTA HEAD FINDINGS Anterior circulation: Atheromatous plaque along the carotid siphons. Moderate diffuse atheromatous irregularity of MCA branches. No branch occlusion or aneurysm. Posterior circulation: Diseased left vertebral artery. The basilar is primarily filled from the right vertebral artery. Proximal basilar fenestration. Atheromatous irregularity of the basilar  which is diminutive due to fetal type bilateral PCA flow. Moderate atheromatous irregularity of the posterior cerebral arteries. Venous sinuses: Negative Anatomic variants: As above Review of the MIP images  confirms the above findings IMPRESSION: 1. No emergent finding. 2. 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 Stented right cervical carotid which is widely patent. 4. Moderate generalized atheromatous irregularity of intracranial vessels. Electronically Signed   By: Jorje Guild M.D.   On: 07/25/2022 08:06   CT HEAD CODE STROKE WO CONTRAST  Result Date: 07/25/2022 CLINICAL DATA:  Code stroke.  Right-sided weakness and facial droop EXAM: CT HEAD WITHOUT CONTRAST TECHNIQUE: Contiguous axial images were obtained from the base of the skull through the vertex without intravenous contrast. RADIATION DOSE REDUCTION: This exam was performed according to the departmental dose-optimization program which includes automated exposure control, adjustment of the mA and/or kV according to patient size and/or use of iterative reconstruction technique. COMPARISON:  07/12/2022 FINDINGS: Brain: No evidence of acut generalized cerebral volume loss e infarction, hemorrhage, hydrocephalus, extra-axial collection or mass lesion/mass effect. Vascular: No hyperdense vessel or unexpected calcification. Skull: Normal. Negative for fracture or focal lesion. Sinuses/Orbits: No acute finding. Other: These results were by epic chat at the time of interpretation on 07/25/2022 at 7:46 am to provider ANKIT NANAVATI. ASPECTS [facility] Stroke Program Early CT Score) - Ganglionic level infarction (caudate, lentiform nuclei, internal capsule, insula, M1-M3 cortex): 7 - Supraganglionic infarction (M4-M6 cortex): 3 Total score (0-10 with 10 being normal): 10 IMPRESSION: 1. Stable head CT.  No acute finding. 2. ASPECTS is 10. Electronically Signed   By: Jorje Guild M.D.   On: 07/25/2022 07:46    Scheduled Meds:  amLODipine  10 mg Oral Daily   aspirin EC  81 mg Oral Daily   atorvastatin  40 mg Oral QHS   dextrose  1 ampule Intravenous Once   feeding supplement  237 mL Oral  TID   fluconazole  100 mg Oral Daily   fluticasone  2 spray Each Nare Daily   folic acid  1 mg Oral Daily   heparin  5,000 Units Subcutaneous Q8H   [START ON 07/27/2022] insulin aspart  0-15 Units Subcutaneous TID WC   insulin aspart  0-5 Units Subcutaneous QHS   ipratropium-albuterol  3 mL Nebulization TID   levETIRAcetam  500 mg Oral BID   megestrol  200 mg Oral Daily   metoprolol tartrate       metoprolol tartrate  50 mg Oral BID   multivitamin  15 mL Oral Daily   pantoprazole  40 mg Oral BID AC   senna  2 tablet Oral QHS   sodium chloride flush  3 mL Intravenous Q12H   sodium chloride flush  3 mL Intravenous Q12H   sucralfate  1 g Oral TID WC & HS   thiamine  100 mg Oral Daily   umeclidinium-vilanterol  1 puff Inhalation Daily   Continuous Infusions:  sodium chloride     dextrose 10 % and 0.45 % NaCl       LOS: 0 days   Roxan Hockey M.D on 07/26/2022 at 6:22 PM  Go to [email or web address] - for contact info  [facility] - Office  [phone]  If 7PM-7AM, please contact night-coverage [email or web address] 07/26/2022, 6:22 PM

## 2022-07-26 NOTE — Anesthesia Postprocedure Evaluation (Signed)
Anesthesia Post Note  Patient: Colin Wagner  Procedure(s) Performed: ESOPHAGOGASTRODUODENOSCOPY (EGD) WITH PROPOFOL ESOPHAGEAL DILATION ESOPHAGEAL BRUSHING  Patient location during evaluation: Phase II Anesthesia Type: General Level of consciousness: awake and alert and oriented Pain management: pain level controlled Vital Signs Assessment: post-procedure vital signs reviewed and stable Respiratory status: spontaneous breathing, nonlabored ventilation and respiratory function stable Cardiovascular status: blood pressure returned to baseline and stable Postop Assessment: no apparent nausea or vomiting Anesthetic complications: no  No notable events documented.   Last Vitals:  Vitals:   07/26/22 1339 07/26/22 1446  BP: (!) 155/86   Pulse: (!) 105   Resp: 16   Temp: (!) 36.3 C   SpO2:  96%    Last Pain:  Vitals:   07/26/22 1339  TempSrc: Oral  PainSc:                  Almina Schul C Jermine Bibbee

## 2022-07-26 NOTE — Telephone Encounter (Signed)
Mandy: needs hospital follow-up with Dr. [name], myself, or any APP in 3-4 weeks. Thanks!

## 2022-07-26 NOTE — Progress Notes (Signed)
Consent for EGD placed in patients chart.

## 2022-07-26 NOTE — Progress Notes (Signed)
   07/26/22 1810  Vitals  Temp 98.2 F (36.8 C)  BP (!) 158/73  MAP (mmHg) 94  BP Location Right Arm  BP Method Automatic  Patient Position (if appropriate) Lying  Pulse Rate (!) 115  Pulse Rate Source Monitor  Resp 18  MEWS COLOR  MEWS Score Color Yellow  Oxygen Therapy  SpO2 98 %  O2 Device Room Air  MEWS Score  MEWS Temp 0  MEWS Systolic 0  MEWS Pulse 2  MEWS RR 0  MEWS LOC 0  MEWS Score 2  Provider Notification  Provider Name/Title Roxan Hockey MD  Date Provider Notified 07/26/22  Time Provider Notified [phone]  Notification Reason Other (Comment) (Yellow MEWS)  Date Critical Result Received 07/26/22  Time Critical Result Received 1822  Provider response No new orders  Date of Provider Response 07/26/22  Time of Provider Response [phone]

## 2022-07-26 NOTE — Interval H&P Note (Signed)
History and Physical Interval Note:  07/26/2022 11:35 AM  Colin Wagner  has presented today for surgery, with the diagnosis of odynophagia.  The various methods of treatment have been discussed with the patient and family. After consideration of risks, benefits and other options for treatment, the patient has consented to  Procedure(s): ESOPHAGOGASTRODUODENOSCOPY (EGD) WITH PROPOFOL (N/A) ESOPHAGEAL DILATION (N/A) as a surgical intervention.  The patient's history has been reviewed, patient examined, no change in status, stable for surgery.  I have reviewed the patient's chart and labs.  Questions were answered to the patient's satisfaction.     [name]

## 2022-07-26 NOTE — Op Note (Signed)
[facility] Patient Name: Colin Wagner Procedure Date: 07/26/2022 10:15 AM MRN: [id number] Date of Birth: 04-24-1951 Attending MD: Elon Alas. Abbey Chatters , [place], [id number] CSN: [id number] Age: 71 Admit Type: Inpatient Procedure:                Upper GI endoscopy Indications:              Dysphagia, Odynophagia Providers:                Elon Alas. Abbey Chatters, DO, Hughie Closs RN, RN,                            Suzan Garibaldi. Risa Grill, Technician Referring MD:              Medicines:                See the Anesthesia note for documentation of the                            administered medications Complications:            No immediate complications. Estimated Blood Loss:     Estimated blood loss was minimal. Procedure:                Pre-Anesthesia Assessment:                           - The anesthesia plan was to use monitored                            anesthesia care (MAC).                           After obtaining informed consent, the endoscope was                            passed under direct vision. Throughout the                            procedure, the patient's blood pressure, pulse, and                            oxygen saturations were monitored continuously. The                            [id number]-H190 ([id number]) scope was introduced through the                            mouth, and advanced to the second part of duodenum.                            The upper GI endoscopy was accomplished without                            difficulty. The patient tolerated the procedure                            well. Scope  In: 11:43:06 AM Scope Out: 11:50:01 AM Total Procedure Duration: 0 hours 6 minutes 55 seconds  Findings:      Moderately severe esophagitis with bleeding was found 25 to 30 cm from       the incisors. Cells for cytology were obtained by brushing. Likely       radiation induced.      The entire examined stomach was normal.      The duodenal bulb, first portion of the  duodenum and second portion of       the duodenum were normal. Impression:               - Moderately severe erosive esophagitis with                            bleeding. Cells for cytology obtained.                           - Normal stomach.                           - Normal duodenal bulb, first portion of the                            duodenum and second portion of the duodenum. Moderate Sedation:      Per Anesthesia Care Recommendation:           - Return patient to hospital ward for ongoing care.                           - Soft diet.                           - Use a proton pump inhibitor PO BID.                           - Use sucralfate suspension 1 gram PO QID.                           - Consider repeat EGD with dilation after                            esophagitis has healed if still having issues with                            dysphagia. Procedure Code(s):        --- Professional ---                           [id number], Esophagogastroduodenoscopy, flexible,                            transoral; diagnostic, including collection of                            specimen(s) by brushing or washing, when performed                            (  separate procedure) Diagnosis Code(s):        --- Professional ---                           K20.81, Other esophagitis with bleeding                           R13.10, Dysphagia, unspecified CPT copyright 2022 [employer]. All rights reserved. The codes documented in this report are preliminary and upon coder review may  be revised to meet current compliance requirements. Elon Alas. Abbey Chatters, DO [place] Abbey Chatters, DO 07/26/2022 12:06:37 PM This report has been signed electronically. Number of Addenda: 0

## 2022-07-26 NOTE — Care Management Obs Status (Signed)
[facility] NOTIFICATION   Patient Details  Name: Colin Wagner MRN: [id number] Date of Birth: 1951/01/26   Medicare Observation Status Notification Given:  Yes    Tommy Medal 07/26/2022, 3:02 PM

## 2022-07-26 NOTE — Inpatient Diabetes Management (Signed)
Inpatient Diabetes Program Recommendations  AACE/ADA: New Consensus Statement on Inpatient Glycemic Control (2015)  Target Ranges:  Prepandial:   less than 140 mg/dL      Peak postprandial:   less than 180 mg/dL (1-2 hours)      Critically ill patients:  140 - 180 mg/dL   Lab Results  Component Value Date   GLUCAP 244 (H) 07/26/2022   HGBA1C 9.4 (H) 07/13/2022    Review of Glycemic Control  Latest Reference Range & Units 07/25/22 23:17 07/26/22 03:14 07/26/22 07:18 07/26/22 10:40  Glucose-Capillary 70 - 99 mg/dL 329 (H) 232 (H) 205 (H) 244 (H)  (H): Data is abnormally high Diabetes history: Type 2 DM Outpatient Diabetes medications: Glipizide 2.5 mg QA, Levemir 32 units QPM Current orders for Inpatient glycemic control: CBGs Q4h  Inpatient Diabetes Program Recommendations:    Noted significant hypoglycemia on presentation along with poor oral intake.   Would consider: - Levemir 8 units QD - Novolog 0-6 units Q4H - Discontinue Glipizide given recent baseline creatinine/GFR?  Thanks, Bronson Curb, MSN, RNC-OB Diabetes Coordinator [phone] (8a-5p)

## 2022-07-26 NOTE — Consult Note (Signed)
Gastroenterology Consult   Referring Provider: Dr. Roxan Hockey  Primary Care Physician:  [name], MD Primary Gastroenterologist:  Dr. [name]   Patient ID: Jeannine Boga; [id number]; 10/23/1950   Admit date: 07/25/2022  LOS: 0 days   Date of Consultation: 07/26/2022  Reason for Consultation:  Dysphagia/odynophagia   History of Present Illness   EMITT MAGLIONE is a 71 y.o. 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 GI has been consulted due to dysphagia, as outpatient EGD had already been scheduled for today.   In ED: glucose 27. Creatinine 2.17, lactic acid 2.3, blood cultures done with no growth thus far, WBC count 15.6, Hgb 11.1. CT head code stroke stable. CTA head/neck without acute findings (further findings below), CXR with stable masslike opacity in medial right upper lobe unchanged.   Patient had been seen as outpatient by Dr. [name] on 07/20/22 with progressive odynophagia and epigastric pain worsened postprandially. Able to tolerate only small amounts of mashed potatoes/gravy. Pantoprazole 40 mg daily. He had dilation of upper esophageal sphincter in May 2023 with improvement at that time. Concern for radiation-induced injury, possible candida, other differentials including pill-induced esophagitis. He has held his Plavix with last dose 11/28.    CT chest without contrast in Nov 2023 with fluid distention of esophagus with diffuse esophageal wall thickening.     Past Medical History:  Diagnosis Date   Alcohol abuse    Alcoholic pancreatitis 0865    admission   Chronic back pain    Chronic pancreatitis ([place])    based on ct findings 2016   COPD (chronic obstructive pulmonary disease) ([place])    Diabetes mellitus    type 2   Diverticulosis    Gastritis    GERD (gastroesophageal reflux disease)    Headache    History of radiation therapy 05/12/17- 06/22/17   Left cheek and bilateral neck/ 60 Gy in 30 fractions to gross disease   Hyperlipidemia    Hypertension    Jaw cancer ([place])    left jaw part of jaw bone removed   Peptic ulcer disease 1999   Per medical reports, no H pylori   Pneumonia    Renal cancer, left ([place]) 2012   he tells me that he has been released, ?and that he is free of cancer, and never had it to begin with.    Right shoulder pain    Testicular hypofunction     Past Surgical History:  Procedure Laterality Date   BALLOON DILATION  12/31/2021   Procedure: BALLOON DILATION;  Surgeon: [name], MD;  [facility];  Service: Endoscopy;;   BIOPSY  04/30/2022   Procedure: BIOPSY;  Surgeon: Maryjane Hurter, MD;  [facility]: [facility];  Service: Pulmonary;;   CAROTID STENT  06/2018   COLONOSCOPY  2003   Dr. [name], polyps   COLONOSCOPY  2005   Dr. [name], multiple diverticula   COLONOSCOPY  2008   Dr. [name], diverticulosis   COLONOSCOPY WITH PROPOFOL N/A 10/21/2016   Dr. [name]: Diverticulosis, two 5-7 mm polyps removed. path-tubular adenomas.  Next colonoscopy in 5 years.   COLONOSCOPY WITH  PROPOFOL N/A 12/31/2021   Procedure: COLONOSCOPY WITH PROPOFOL;  Surgeon: [name], MD;  [facility];  Service: Endoscopy;  Laterality: N/A;  8:15am   ENDOBRONCHIAL ULTRASOUND Right 04/30/2022   Procedure: ENDOBRONCHIAL ULTRASOUND;  Surgeon: Maryjane Hurter, MD;  [facility];  Service: Pulmonary;  Laterality: Right;   ESOPHAGOGASTRODUODENOSCOPY     Multiple EGDs. 1999 EGD showed gastric ulcers, no H pylori and benign biopsies performed by Dr. [name]. 2001  gastric ulcer healed. Last EGD 2005 had gastritis.   ESOPHAGOGASTRODUODENOSCOPY (EGD) WITH PROPOFOL N/A 12/31/2021   narrowing/deformity at UES s/p baloon dilation   FINE NEEDLE ASPIRATION  04/30/2022   Procedure: FINE NEEDLE ASPIRATION (FNA) LINEAR;  Surgeon: Maryjane Hurter, MD;  [facility];  Service: Pulmonary;;   IR GASTROSTOMY TUBE REMOVAL  02/08/2020   IR IMAGING GUIDED PORT INSERTION  05/26/2022   IR REPLACE G-TUBE SIMPLE WO FLUORO  03/20/2019   IR REPLACE G-TUBE SIMPLE WO FLUORO  09/21/2019   IR REPLC GASTRO/COLONIC TUBE PERCUT W/FLUORO  05/16/2018   Left partial mandibulectomy, Scapular free flap reconstruction, selective neck dissection, tracheotomy, and resection of intraoral palate cancer. Left 03/01/2017   [facility]   LUNG BIOPSY     MOUTH SURGERY     PARTIAL NEPHRECTOMY Left 2012   POLYPECTOMY  10/21/2016   Procedure: POLYPECTOMY;  Surgeon: [name], MD;  [facility];  Service: Endoscopy;;  hepatic flexure x2   POLYPECTOMY  12/31/2021   Procedure: POLYPECTOMY INTESTINAL;  Surgeon: [name], MD;  [facility];  Service: Endoscopy;;   SURGERY OF LIP  06/2019   lip revision   TRANSURETHRAL RESECTION OF PROSTATE N/A 03/01/2019   Procedure: TRANSURETHRAL RESECTION OF THE PROSTATE (TURP)WITH CYSTOSCOPY;  Surgeon: Raynelle Bring, MD;  [facility]: [facility];  Service: Urology;  Laterality: N/A;   VIDEO BRONCHOSCOPY Right 04/30/2022   Procedure: VIDEO BRONCHOSCOPY WITHOUT FLUORO;  Surgeon: Maryjane Hurter, MD;  [facility];  Service: Pulmonary;  Laterality: Right;    Prior to Admission medications   Medication Sig Start Date End Date Taking? Authorizing Provider  acetaminophen (TYLENOL) 500 MG tablet Take 1,000 mg by mouth every 6 (six) hours as needed for mild pain or moderate pain.   Yes [provider]  albuterol (VENTOLIN HFA) 108 (90 Base) MCG/ACT inhaler Inhale 2 puffs into the  lungs every 4 (four) hours as needed for wheezing or shortness of breath. 03/29/22  Yes [provider]  amLODipine (NORVASC) 10 MG tablet Take 1 tablet (10 mg total) by mouth daily. 07/14/22  Yes Eugenie Filler, MD  aspirin EC 81 MG tablet Take 81 mg by mouth daily.   Yes [provider]  atorvastatin (LIPITOR) 40 MG tablet Take 40 mg by mouth daily. 02/14/17  Yes [provider]  cholecalciferol (VITAMIN D) 25 MCG (1000 UNIT) tablet Take 1,000 Units by mouth daily.   Yes [provider]  fluconazole (DIFLUCAN) 100 MG tablet Take 100 mg by mouth daily. 07/20/22  Yes [provider]  fluticasone (FLONASE) 50 MCG/ACT nasal spray Place 2 sprays into both nostrils daily.   Yes [provider]  GARLIC PO Take 1 tablet by mouth daily.   Yes [provider]  HYDROcodone-acetaminophen (NORCO) 7.5-325 MG tablet Take 1 tablet by mouth every 6 (six) hours as needed for moderate pain. 06/30/22  Yes [provider]  LEVEMIR FLEXTOUCH 100 UNIT/ML FlexTouch Pen Inject 32 Units  into the skin every evening. 07/14/22  Yes Eugenie Filler, MD  levETIRAcetam (KEPPRA) 500 MG tablet Take 1 tablet (500 mg total) by mouth 2 (two) times daily. 07/14/22  Yes Eugenie Filler, MD  LORazepam (ATIVAN) 1 MG tablet Take 1 mg by mouth every 6 (six) hours as needed for anxiety or sleep. 03/31/15  Yes [provider]  megestrol (MEGACE) 40 MG/ML suspension Take 200 mg by mouth daily.   Yes [provider]  metoCLOPramide (REGLAN) 10 MG tablet Take 1 tablet (10 mg total) by mouth every 6 (six) hours as needed (Hiccups.). 07/14/22 07/14/23 Yes Eugenie Filler, MD  nystatin cream (MYCOSTATIN) Apply to affected area 2 times daily 06/13/22  Yes Volney American, PA-C  OVER THE COUNTER MEDICATION Take 10 mLs by mouth 2 (two) times daily. maalox   Yes [provider]  pantoprazole (PROTONIX) 40 MG tablet Take 1 tablet (40 mg  total) by mouth 2 (two) times daily before a meal. 04/06/22  Yes Rourk, Cristopher Estimable, MD  prochlorperazine (COMPAZINE) 10 MG tablet TAKE 1 TABLET(10 MG) BY MOUTH EVERY 6 HOURS AS NEEDED FOR NAUSEA OR VOMITING Patient taking differently: Take 10 mg by mouth every 6 (six) hours as needed for vomiting or nausea. 07/15/22  Yes Heilingoetter, Cassandra L, PA-C  senna (SENOKOT) 8.6 MG TABS tablet Take 1 tablet (8.6 mg total) by mouth at bedtime as needed for mild constipation. May be needed as pain medication can constipate. 01/12/17  Yes [name], MD  sildenafil (VIAGRA) 100 MG tablet Take 100 mg by mouth daily as needed for erectile dysfunction.   Yes [provider]  sucralfate (CARAFATE) 1 g tablet Take 1 tablet (1 g total) by mouth 3 (three) times daily as needed. May dissolve 1 tab and glass of water and drink as needed 06/13/22  Yes Volney American, PA-C  testosterone cypionate (DEPOTESTOSTERONE CYPIONATE) 200 MG/ML injection Inject 1.5 mLs into the muscle every 14 (fourteen) days. 05/22/22  Yes [provider]  tiZANidine (ZANAFLEX) 4 MG tablet Take 4 mg by mouth every 6 (six) hours as needed for muscle spasms. 06/30/22  Yes [provider]  umeclidinium-vilanterol (ANORO ELLIPTA) 62.5-25 MCG/ACT AEPB Inhale 1 puff into the lungs daily. 05/19/22  Yes Spero Geralds, MD  clopidogrel (PLAVIX) 75 MG tablet Take 75 mg by mouth daily. 05/27/19   [provider]  ferrous sulfate 325 (65 FE) MG tablet Take 325 mg by mouth daily with breakfast.    [provider]  glipiZIDE (GLUCOTROL XL) 2.5 MG 24 hr tablet Take 2.5 mg by mouth every morning. 08/11/18   [provider]  tadalafil (CIALIS) 20 MG tablet Take 1 tablet (20 mg total) by mouth daily as needed for erectile dysfunction. Take 30-60 minutes prior to intercourse Patient not taking: Reported on 07/25/2022 11/19/21   Primus Bravo., MD    Current Facility-Administered Medications   Medication Dose Route Frequency Provider Last Rate Last Admin   0.9 %  sodium chloride infusion   Intravenous PRN Denton Brick, Courage, MD       acetaminophen (TYLENOL) tablet 650 mg  650 mg Oral Q6H PRN Emokpae, Courage, MD       Or   acetaminophen (TYLENOL) suppository 650 mg  650 mg Rectal Q6H PRN Emokpae, Courage, MD       albuterol (PROVENTIL) (2.5 MG/3ML) 0.083% nebulizer solution 2.5 mg  2.5 mg Nebulization Q6H PRN Roxan Hockey, MD       alum &  mag hydroxide-simeth (MAALOX/MYLANTA) 200-200-20 MG/5ML suspension 15 mL  15 mL Oral Q6H PRN Emokpae, Courage, MD   15 mL at 07/25/22 2345   amLODipine (NORVASC) tablet 10 mg  10 mg Oral Daily Emokpae, Courage, MD       aspirin EC tablet 81 mg  81 mg Oral Daily Emokpae, Courage, MD   81 mg at 07/25/22 1754   atorvastatin (LIPITOR) tablet 40 mg  40 mg Oral QHS Emokpae, Courage, MD   40 mg at 07/25/22 2343   bisacodyl (DULCOLAX) suppository 10 mg  10 mg Rectal Daily PRN Emokpae, Courage, MD       dextrose 10 % and 0.45 % NaCl infusion   Intravenous Continuous Emokpae, Courage, MD 125 mL/hr at 07/26/22 0844 New Bag at 07/26/22 0844   dextrose 50 % solution 50 mL  1 ampule Intravenous Once Emokpae, Courage, MD       feeding supplement (ENSURE ENLIVE / ENSURE PLUS) liquid 237 mL  237 mL Oral TID Denton Brick, Courage, MD   237 mL at 07/25/22 2238   fentaNYL (SUBLIMAZE) injection 25 mcg  25 mcg Intravenous Q2H PRN Roxan Hockey, MD   25 mcg at 07/25/22 1241   fluconazole (DIFLUCAN) tablet 100 mg  100 mg Oral Daily Emokpae, Courage, MD   100 mg at 07/25/22 1755   fluticasone (FLONASE) 50 MCG/ACT nasal spray 2 spray  2 spray Each Nare Daily Emokpae, Courage, MD   2 spray at 02/72/53 6644   folic acid (FOLVITE) tablet 1 mg  1 mg Oral Daily Emokpae, Courage, MD   1 mg at 07/25/22 1755   heparin injection 5,000 Units  5,000 Units Subcutaneous Q8H Emokpae, Courage, MD       HYDROcodone-acetaminophen (NORCO/VICODIN) 5-325 MG per tablet 1.5 tablet  1.5 tablet Oral  Q6H PRN Denton Brick, Courage, MD   1.5 tablet at 07/25/22 1811   ipratropium-albuterol (DUONEB) 0.5-2.5 (3) MG/3ML nebulizer solution 3 mL  3 mL Nebulization TID Roxan Hockey, MD   3 mL at 07/26/22 0907   labetalol (NORMODYNE) injection 10 mg  10 mg Intravenous Q4H PRN Roxan Hockey, MD       levETIRAcetam (KEPPRA) tablet 500 mg  500 mg Oral BID Denton Brick, Courage, MD   500 mg at 07/25/22 2344   LORazepam (ATIVAN) tablet 1 mg  1 mg Oral Q6H PRN Roxan Hockey, MD   1 mg at 07/25/22 2343   megestrol (MEGACE) 40 MG/ML suspension 200 mg  200 mg Oral Daily Emokpae, Courage, MD       multivitamin liquid 15 mL  15 mL Oral Daily Emokpae, Courage, MD       ondansetron (ZOFRAN) tablet 4 mg  4 mg Oral Q6H PRN Emokpae, Courage, MD       Or   ondansetron (ZOFRAN) injection 4 mg  4 mg Intravenous Q6H PRN Emokpae, Courage, MD       pantoprazole (PROTONIX) EC tablet 40 mg  40 mg Oral BID AC Emokpae, Courage, MD   40 mg at 07/25/22 1755   polyethylene glycol (MIRALAX / GLYCOLAX) packet 17 g  17 g Oral Daily PRN Emokpae, Courage, MD       senna (SENOKOT) tablet 17.2 mg  2 tablet Oral QHS Emokpae, Courage, MD   17.2 mg at 07/25/22 2343   sodium chloride flush (NS) 0.9 % injection 3 mL  3 mL Intravenous Q12H Emokpae, Courage, MD       sodium chloride flush (NS) 0.9 % injection 3 mL  3 mL Intravenous  Q12H Roxan Hockey, MD   3 mL at 07/25/22 2344   sodium chloride flush (NS) 0.9 % injection 3 mL  3 mL Intravenous PRN Emokpae, Courage, MD       sucralfate (CARAFATE) tablet 1 g  1 g Oral TID WC & HS Emokpae, Courage, MD   1 g at 07/25/22 2344   thiamine (VITAMIN B1) tablet 100 mg  100 mg Oral Daily Emokpae, Courage, MD   100 mg at 07/25/22 1755   tiZANidine (ZANAFLEX) tablet 4 mg  4 mg Oral Q6H PRN Emokpae, Courage, MD       traZODone (DESYREL) tablet 50 mg  50 mg Oral QHS PRN Emokpae, Courage, MD       umeclidinium-vilanterol (ANORO ELLIPTA) 62.5-25 MCG/ACT 1 puff  1 puff Inhalation Daily Emokpae, Courage, MD    1 puff at 07/26/22 0915    Allergies as of 07/25/2022 - Review Complete 07/25/2022  Allergen Reaction Noted   Trazodone Other (See Comments) 07/25/2022    Family History  Problem Relation Age of Onset   Hypertension Mother    Colon cancer Neg Hx     Social History   Socioeconomic History   Marital status: Divorced    Spouse name: Not on file   Number of children: 0   Years of education: Not on file   Highest education level: Not on file  Occupational History   Not on file  Tobacco Use   Smoking status: Former    Packs/day: 0.50    Years: 35.00    Total pack years: 17.50    Types: Cigarettes    Quit date: 03/01/2017    Years since quitting: 5.4   Smokeless tobacco: Never   Tobacco comments:    quit 02-2017  Vaping Use   Vaping Use: Never used  Substance and Sexual Activity   Alcohol use: No    Comment: quit 2000 but relapse in 2016. no etoh since hospitalized 2016.    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   Patient with a history of smoking half pack a day for 35+ years. Patient currently only smoking a few cigarettes per day.   The patient has history of alcohol abuse. Patient quit in 2000 and then relapsed in 2016. Patient has not had any alcohol since 2016.   Social Determinants of Health   Financial Resource Strain: High Risk (06/03/2022)   Overall Financial Resource Strain (CARDIA)    Difficulty of Paying Living Expenses: Very hard  Food Insecurity: No Food Insecurity (07/26/2022)   Hunger Vital Sign    Worried About Running Out of Food in the Last Year: Never true    Ran Out of Food in the Last Year: Never true  Transportation Needs: No Transportation Needs (07/26/2022)   PRAPARE - Hydrologist (Medical): No    Lack of Transportation (Non-Medical): No  Physical Activity: Inactive (07/27/2019)    Exercise Vital Sign    Days of Exercise per Week: 0 days    Minutes of Exercise per Session: 0 min  Stress: No Stress Concern Present (07/27/2019)   [place]    Feeling of Stress : Not at all  Social Connections: Unknown (07/27/2019)   Social Connection and Isolation Panel [NHANES]    Frequency of Communication  with Friends and Family: More than three times a week    Frequency of Social Gatherings with Friends and Family: More than three times a week    Attends Religious Services: More than 4 times per year    Active Member of [employer] or Organizations: Yes    Attends Music therapist: More than 4 times per year    Marital Status: Not on file  Intimate Partner Violence: Not At Risk (07/26/2022)   Humiliation, Afraid, Rape, and Kick questionnaire    Fear of Current or Ex-Partner: No    Emotionally Abused: No    Physically Abused: No    Sexually Abused: No     Review of Systems   Gen: see HPI CV: Denies chest pain, heart palpitations, syncope, edema  Resp: Denies shortness of breath with rest, cough, wheezing, coughing up blood, and pleurisy. GI: see HPI GU : Denies urinary burning, blood in urine, urinary frequency, and urinary incontinence. MS: Denies joint pain, limitation of movement, swelling, cramps, and atrophy.  Derm: Denies rash, itching, dry skin, hives. Psych: Denies depression, anxiety, memory loss, hallucinations, and confusion. Heme: Denies bruising or bleeding Neuro:  Denies any headaches, dizziness, paresthesias, shaking  Physical Exam   Vital Signs in last 24 hours: Temp:  [97.6 F (36.4 C)-98.2 F (36.8 C)] 98.2 F (36.8 C) (12/04 0620) Pulse Rate:  [96-109] 101 (12/04 0620) Resp:  [16-22] 16 (12/03 2313) BP: (104-156)/(48-86) 111/64 (12/04 0620) SpO2:  [93 %-100 %] 99 % (12/04 0915)    General:   Alert,  Well-developed, well-nourished, pleasant and cooperative in NAD Head:   Normocephalic and atraumatic. Eyes:  Sclera clear, no icterus.  Ears:  Normal auditory acuity. Lungs:  Clear throughout to auscultation.    Heart:  S1 S2 present  Abdomen:  Soft, mild TTP epigastric and nondistended. Normal bowel sounds, without guarding, and without rebound.   Rectal: deferred   Msk:  Symmetrical without gross deformities. Normal posture. Extremities:  Without  edema. Neurologic:  Alert and  oriented x4. Skin:  Intact without significant lesions or rashes. Psych:  Alert and cooperative. Normal mood and affect.  Intake/Output from previous day: 12/03 0701 - 12/04 0700 In: 2528.7 [I.V.:1054.3; IV Piggyback:1474.4] Out: 1325 [Urine:1325] Intake/Output this shift: Total I/O In: 0  Out: 200 [Urine:200]   Labs/Studies   Recent Labs Recent Labs    07/25/22 0738 07/25/22 0743  WBC 15.6*  --   HGB 11.1* 12.6*  HCT 33.8* 37.0*  PLT 398  --    BMET Recent Labs    07/23/22 1247 07/25/22 0738 07/25/22 0743  NA 131* 135 134*  K 4.4 4.1 3.8  CL 96* 97* 97*  CO2 23 27  --   GLUCOSE 55* 33* 28*  BUN 33* 40* 34*  CREATININE 1.92* 2.17* 2.10*  CALCIUM 9.7 9.9  --    LFT Recent Labs    07/25/22 0738  PROT 8.2*  ALBUMIN 3.8  AST 55*  ALT 23  ALKPHOS 87  BILITOT 0.1*   PT/INR Recent Labs    07/25/22 0738  LABPROT 14.9  INR 1.2     Radiology/Studies DG Chest Port 1 View  Result Date: 07/25/2022 CLINICAL DATA:  Low blood sugar last night. EXAM: PORTABLE CHEST 1 VIEW COMPARISON:  July 12 2022, chest CT July 10, 2022 FINDINGS: The heart size and mediastinal contours are stable. Stable masslike opacity is identified in the medial right upper lobe/right paratracheal region unchanged. Left central venous lines tendons at distal  tip in the superior vena cava unchanged. Both lungs are otherwise clear. The visualized skeletal structures are unremarkable. IMPRESSION: Stable masslike opacity is identified in the medial right upper lobe/right  paratracheal region unchanged. Lungs are otherwise clear. Electronically Signed   By: Abelardo Diesel M.D.   On: 07/25/2022 08:57   CT ANGIO HEAD NECK W WO CM (CODE STROKE)  Result Date: 07/25/2022 CLINICAL DATA:  Right-sided weakness with facial droop. Stroke suspected EXAM: CT ANGIOGRAPHY HEAD AND NECK TECHNIQUE: Multidetector CT imaging of the head and neck was performed using the standard protocol during bolus administration of intravenous contrast. Multiplanar CT image reconstructions and MIPs were obtained to evaluate the vascular anatomy. Carotid stenosis measurements (when applicable) are obtained utilizing NASCET criteria, using the distal internal carotid diameter as the denominator. RADIATION DOSE REDUCTION: This exam was performed according to the departmental dose-optimization program which includes automated exposure control, adjustment of the mA and/or kV according to patient size and/or use of iterative reconstruction technique. CONTRAST:  7mL OMNIPAQUE IOHEXOL 350 MG/ML SOLN COMPARISON:  Head CT from today.  Intracranial MRA 07/12/2022 FINDINGS: CTA NECK FINDINGS Aortic arch: Atherosclerosis with irregular plaque. Two vessel branching. Right carotid system: Atheromatous plaque with stent spanning the distal common carotid to proximal ICA. No in stent stenosis. Left carotid system: Atheromatous wall thickening diffusely with intermittent calcification. No stenosis or ulceration. Vertebral arteries: Proximal subclavian atherosclerosis with advanced low-density plaque at the left vertebral origin. The left vertebral artery is nonenhancing essentially throughout its course. The right vertebral artery is patent with mild atheromatous narrowing at its origin. Distal right vertebral fenestration at the level of C1. Skeleton: Left mandibular fracture/osteotomy fixation without bony bridging but also no sign of mandibular hardware loosening. Generalized cervical spine degeneration. Other neck:  Postoperative left lateral neck and face with flap no aggressive finding. Left mastoid and middle ear opacification, chronic and possibly from prior radiotherapy. Upper chest: Known right upper lobe mass. Review of the MIP images confirms the above findings CTA HEAD FINDINGS Anterior circulation: Atheromatous plaque along the carotid siphons. Moderate diffuse atheromatous irregularity of MCA branches. No branch occlusion or aneurysm. Posterior circulation: Diseased left vertebral artery. The basilar is primarily filled from the right vertebral artery. Proximal basilar fenestration. Atheromatous irregularity of the basilar which is diminutive due to fetal type bilateral PCA flow. Moderate atheromatous irregularity of the posterior cerebral arteries. Venous sinuses: Negative Anatomic variants: As above Review of the MIP images confirms the above findings IMPRESSION: 1. No emergent finding. 2. 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 Stented right cervical carotid which is widely patent. 4. Moderate generalized atheromatous irregularity of intracranial vessels. Electronically Signed   By: Jorje Guild M.D.   On: 07/25/2022 08:06   CT HEAD CODE STROKE WO CONTRAST  Result Date: 07/25/2022 CLINICAL DATA:  Code stroke.  Right-sided weakness and facial droop EXAM: CT HEAD WITHOUT CONTRAST TECHNIQUE: Contiguous axial images were obtained from the base of the skull through the vertex without intravenous contrast. RADIATION DOSE REDUCTION: This exam was performed according to the departmental dose-optimization program which includes automated exposure control, adjustment of the mA and/or kV according to patient size and/or use of iterative reconstruction technique. COMPARISON:  07/12/2022 FINDINGS: Brain: No evidence of acut generalized cerebral volume loss e infarction, hemorrhage, hydrocephalus, extra-axial collection or mass lesion/mass effect.  Vascular: No hyperdense vessel or unexpected calcification. Skull: Normal. Negative for fracture or focal lesion. Sinuses/Orbits: No acute  finding. Other: These results were by epic chat at the time of interpretation on 07/25/2022 at 7:46 am to provider ANKIT NANAVATI. ASPECTS [facility] Stroke Program Early CT Score) - Ganglionic level infarction (caudate, lentiform nuclei, internal capsule, insula, M1-M3 cortex): 7 - Supraganglionic infarction (M4-M6 cortex): 3 Total score (0-10 with 10 being normal): 10 IMPRESSION: 1. Stable head CT.  No acute finding. 2. ASPECTS is 10. Electronically Signed   By: Jorje Guild M.D.   On: 07/25/2022 07:46     Assessment   KYLAN LIBERATI is a 71 y.o. 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 GI has been consulted due to dysphagia, as outpatient EGD had already been scheduled for today.   Dysphagia/odynophagia: previously responding well to dilation of upper esophageal sphincter in May 2023. Now with progressive dysphagia/odynophagia with differentials including radiation-induced injury, Candida, pill-induced esophagitis. Also noting epigastric pain postprandially. If epigastric pain continues, may need abdominal imaging. He has held Plavix with last dose 11/28. Plans had been for outpatient EGD/dilation actually today; however, as he is now inpatient, will still pursue today by Dr Abbey Chatters.     Plan / Recommendations    Remain NPO PPI BID Proceed with upper endoscopy/dilation by Dr. Abbey Chatters today: the risks, benefits, and alternatives have been discussed with  the patient in detail. The patient states understanding and desires to proceed.  Plavix remains on hold. Last dose was 11/28 Further recommendations to follow      07/26/2022, 9:49 AM  Annitta Needs, PhD, ANP-BC [facility]

## 2022-07-26 NOTE — Progress Notes (Signed)
Patient blood glucose 232. Per MD Adefeso start D5-0.9 NS @ 50 when blood sugar is less than 250. Fluids started. Will continue to monitor.

## 2022-07-27 ENCOUNTER — Other Ambulatory Visit: Payer: Self-pay

## 2022-07-27 DIAGNOSIS — C3411 Malignant neoplasm of upper lobe, right bronchus or lung: Secondary | ICD-10-CM

## 2022-07-27 DIAGNOSIS — R2990 Unspecified symptoms and signs involving the nervous system: Secondary | ICD-10-CM

## 2022-07-27 DIAGNOSIS — R000 Tachycardia, unspecified: Secondary | ICD-10-CM | POA: Diagnosis not present

## 2022-07-27 DIAGNOSIS — E162 Hypoglycemia, unspecified: Secondary | ICD-10-CM | POA: Diagnosis not present

## 2022-07-27 DIAGNOSIS — E11649 Type 2 diabetes mellitus with hypoglycemia without coma: Secondary | ICD-10-CM | POA: Diagnosis not present

## 2022-07-27 LAB — BASIC METABOLIC PANEL
Anion gap: 10 (ref 5–15)
BUN: 20 mg/dL (ref 8–23)
CO2: 26 mmol/L (ref 22–32)
Calcium: 9.6 mg/dL (ref 8.9–10.3)
Chloride: 100 mmol/L (ref 98–111)
Creatinine, Ser: 1.45 mg/dL — ABNORMAL HIGH (ref 0.61–1.24)
GFR, Estimated: 51 mL/min — ABNORMAL LOW (ref >60)
Glucose, Bld: 191 mg/dL — ABNORMAL HIGH (ref 70–99)
Potassium: 4.4 mmol/L (ref 3.5–5.1)
Sodium: 136 mmol/L (ref 135–145)

## 2022-07-27 LAB — GLUCOSE, CAPILLARY
Glucose-Capillary: 180 mg/dL — ABNORMAL HIGH (ref 70–99)
Glucose-Capillary: 201 mg/dL — ABNORMAL HIGH (ref 70–99)
Glucose-Capillary: 222 mg/dL — ABNORMAL HIGH (ref 70–99)

## 2022-07-27 MED ORDER — METOPROLOL SUCCINATE ER 100 MG PO TB24: 100.0000 mg | ORAL_TABLET | Freq: Every day | ORAL | 5 refills | Status: DC

## 2022-07-27 MED ORDER — FERROUS SULFATE 325 (65 FE) MG PO TABS: 325.0000 mg | ORAL_TABLET | Freq: Every day | ORAL | 3 refills | Status: DC

## 2022-07-27 MED ORDER — MEGESTROL ACETATE 40 MG/ML PO SUSP: 400.0000 mg | Freq: Every day | ORAL | 3 refills | Status: DC

## 2022-07-27 MED ORDER — AMLODIPINE BESYLATE 10 MG PO TABS: 10.0000 mg | ORAL_TABLET | Freq: Every day | ORAL | 5 refills | Status: DC

## 2022-07-27 MED ORDER — PANTOPRAZOLE SODIUM 40 MG PO TBEC: 40.0000 mg | DELAYED_RELEASE_TABLET | Freq: Two times a day (BID) | ORAL | 3 refills | Status: DC

## 2022-07-27 MED ORDER — LEVEMIR FLEXTOUCH 100 UNIT/ML ~~LOC~~ SOPN: 18.0000 [IU] | PEN_INJECTOR | Freq: Every day | SUBCUTANEOUS | 11 refills | Status: DC

## 2022-07-27 MED ORDER — ATORVASTATIN CALCIUM 40 MG PO TABS: 40.0000 mg | ORAL_TABLET | Freq: Every day | ORAL | 5 refills | Status: DC

## 2022-07-27 MED ORDER — CLOPIDOGREL BISULFATE 75 MG PO TABS: 75.0000 mg | ORAL_TABLET | Freq: Every day | ORAL | 5 refills | Status: DC

## 2022-07-27 MED ORDER — ANORO ELLIPTA 62.5-25 MCG/ACT IN AEPB: 1.0000 | INHALATION_SPRAY | Freq: Every day | RESPIRATORY_TRACT | 5 refills | Status: DC

## 2022-07-27 MED ORDER — SUCRALFATE 1 G PO TABS: 1.0000 g | ORAL_TABLET | Freq: Three times a day (TID) | ORAL | 1 refills | Status: DC

## 2022-07-27 MED ORDER — ASPIRIN EC 81 MG PO TBEC: 81.0000 mg | DELAYED_RELEASE_TABLET | Freq: Every day | ORAL | 11 refills | Status: DC

## 2022-07-27 NOTE — Discharge Instructions (Signed)
1)Stop Glipizide 2)Reduce Levemir insulin to 18 units every evening from 32 units--- 3)Follow up with Dr [name]----  Gastroenterologist  with [facility]---in 2 to 3 weeks  for evaluation  -address: [address], [place], [place] [address], Phone: [phone] -You will need repeat EGD test in about 3 to 4 weeks 4)Repeat CBC and BMP blood tests in 1 week 5)Restart Plavix/clopidogrel

## 2022-07-27 NOTE — Care Management Important Message (Signed)
Important Message  Patient Details  Name: Colin Wagner MRN: [id number] Date of Birth: 09-28-1950   Medicare Important Message Given:  N/A - LOS <3 / Initial given by admissions     Tommy Medal 07/27/2022, 10:37 AM

## 2022-07-27 NOTE — Progress Notes (Signed)
Patient has rested most of the night during this shift. No prn medications given. Patient uses the yanker suction to clear mucus and secretions independently. Most recent vitals signs are T 97.7 P 101 RR 20 B/P 104/62 O2 sat 97% on RA. Blood glucose @ 0453 is 180. Patient family member at bedside throughout the night.

## 2022-07-27 NOTE — Inpatient Diabetes Management (Signed)
Inpatient Diabetes Program Recommendations  AACE/ADA: New Consensus Statement on Inpatient Glycemic Control (2015)  Target Ranges:  Prepandial:   less than 140 mg/dL      Peak postprandial:   less than 180 mg/dL (1-2 hours)      Critically ill patients:  140 - 180 mg/dL   Lab Results  Component Value Date   GLUCAP 201 (H) 07/27/2022   HGBA1C 9.4 (H) 07/13/2022    Review of Glycemic Control  Latest Reference Range & Units 07/26/22 17:28 07/26/22 19:49 07/26/22 22:37 07/27/22 00:05 07/27/22 04:53 07/27/22 07:21  Glucose-Capillary 70 - 99 mg/dL 282 (H) 275 (H) 208 (H) 222 (H) 180 (H) 201 (H)  (H): Data is abnormally high Diabetes history: Type 2 DM Outpatient Diabetes medications: Glipizide 2.5 mg QA, Levemir 32 units QPM Current orders for Inpatient glycemic control: Novolog 0-15 units TID & HS   Inpatient Diabetes Program Recommendations:     Noted significant hypoglycemia on presentation along with poor oral intake.    Would consider: - Levemir 8 units QD  At discharge: - Discontinue Glipizide given recent baseline creatinine/GFR? - Consider reducing home dose of Levemir?   Thanks, Bronson Curb, MSN, RNC-OB Diabetes Coordinator [phone] (8a-5p)

## 2022-07-27 NOTE — Discharge Summary (Signed)
Colin Wagner, is a 71 y.o. male  DOB 11-24-1950  MRN [id number].  Admission date:  07/25/2022  Admitting Physician  Roxan Hockey, MD  Discharge Date:  07/27/2022   Primary MD  [name], MD  Recommendations for primary care physician for things to follow:   1)Stop Glipizide 2)Reduce Levemir insulin to 18 units every evening from 32 units--- 3)Follow up with Dr [name]----  Gastroenterologist  with [facility]---in 2 to 3 weeks  for evaluation  -address: [address] [place], [place] [address], Phone: [phone] -You will need repeat EGD test in about 3 to 4 weeks 4)Repeat CBC and BMP blood tests in 1 week 5)Restart Plavix/clopidogrel  Admission Diagnosis  Hypoglycemia [E16.2] Tachycardia [R00.0] Stroke-like symptoms [R29.90] Acute GI bleeding [K92.2]   Discharge Diagnosis  Hypoglycemia [E16.2] Tachycardia [R00.0] Stroke-like symptoms [R29.90] Acute GI bleeding [K92.2]  ***  Principal Problem:   Hypoglycemia Active Problems:   Chronic alcoholic pancreatitis ([place])   DM type 2 (diabetes mellitus, type 2) (HCC)   Carcinoma of buccal mucosa (HCC)   Essential hypertension   Squamous cell carcinoma of mandible (HCC)   Primary malignant neoplasm of right upper lobe of lung (HCC)   Failure to thrive in adult   Loss of weight   Esophageal dysphagia   Acute GI bleeding      Past Medical History:  Diagnosis Date   Alcohol abuse    Alcoholic pancreatitis 1761   admission   Chronic back pain    Chronic pancreatitis ([place])    based on ct findings 2016   COPD (chronic obstructive pulmonary disease) ([place])    Diabetes mellitus    type 2   Diverticulosis    Gastritis    GERD (gastroesophageal reflux disease)    Headache    History of radiation therapy 05/12/17- 06/22/17   Left cheek and bilateral neck/ 60 Gy in 30 fractions to gross disease   Hyperlipidemia     Hypertension    Jaw cancer ([place])    left jaw part of jaw bone removed   Peptic ulcer disease 1999   Per medical reports, no H pylori   Pneumonia    Renal cancer, left ([place]) 2012   he tells me that he has been released, ?and that he is free of cancer, and never had it to begin with.    Right shoulder pain    Testicular hypofunction     Past Surgical History:  Procedure Laterality Date   BALLOON DILATION  12/31/2021   Procedure: BALLOON DILATION;  Surgeon: [name], MD;  [facility];  Service: Endoscopy;;   BIOPSY  04/30/2022   Procedure: BIOPSY;  Surgeon: Maryjane Hurter, MD;  Location: [facility];  Service: Pulmonary;;   CAROTID STENT  06/2018   COLONOSCOPY  2003   Dr. [name], polyps   COLONOSCOPY  2005   Dr. [name], multiple diverticula   COLONOSCOPY  2008   Dr. [name], diverticulosis   COLONOSCOPY WITH PROPOFOL N/A 10/21/2016  Dr. [name]: Diverticulosis, two 5-7 mm polyps removed. path-tubular adenomas.  Next colonoscopy in 5 years.   COLONOSCOPY WITH PROPOFOL N/A 12/31/2021   Procedure: COLONOSCOPY WITH PROPOFOL;  Surgeon: [name], MD;  [facility];  Service: Endoscopy;  Laterality: N/A;  8:15am   ENDOBRONCHIAL ULTRASOUND Right 04/30/2022   Procedure: ENDOBRONCHIAL ULTRASOUND;  Surgeon: Maryjane Hurter, MD;  Location: [facility];  Service: Pulmonary;  Laterality: Right;   ESOPHAGOGASTRODUODENOSCOPY     Multiple EGDs. 1999 EGD showed gastric ulcers, no H pylori and benign biopsies performed by Dr. [name]. 2001 gastric ulcer healed. Last EGD 2005 had gastritis.   ESOPHAGOGASTRODUODENOSCOPY (EGD) WITH PROPOFOL N/A 12/31/2021   narrowing/deformity at UES s/p baloon dilation   FINE NEEDLE ASPIRATION  04/30/2022   Procedure: FINE NEEDLE ASPIRATION (FNA) LINEAR;  Surgeon: Maryjane Hurter, MD;  [facility]: [facility];  Service: Pulmonary;;   IR GASTROSTOMY TUBE REMOVAL  02/08/2020   IR IMAGING GUIDED PORT  INSERTION  05/26/2022   IR REPLACE G-TUBE SIMPLE WO FLUORO  03/20/2019   IR REPLACE G-TUBE SIMPLE WO FLUORO  09/21/2019   IR REPLC GASTRO/COLONIC TUBE PERCUT W/FLUORO  05/16/2018   Left partial mandibulectomy, Scapular free flap reconstruction, selective neck dissection, tracheotomy, and resection of intraoral palate cancer. Left 03/01/2017   [facility]   LUNG BIOPSY     MOUTH SURGERY     PARTIAL NEPHRECTOMY Left 2012   POLYPECTOMY  10/21/2016   Procedure: POLYPECTOMY;  Surgeon: [name], MD;  [facility];  Service: Endoscopy;;  hepatic flexure x2   POLYPECTOMY  12/31/2021   Procedure: POLYPECTOMY INTESTINAL;  Surgeon: [name], MD;  [facility];  Service: Endoscopy;;   SURGERY OF LIP  06/2019   lip revision   TRANSURETHRAL RESECTION OF PROSTATE N/A 03/01/2019   Procedure: TRANSURETHRAL RESECTION OF THE PROSTATE (TURP)WITH CYSTOSCOPY;  Surgeon: Raynelle Bring, MD;  Location: [facility];  Service: Urology;  Laterality: N/A;   VIDEO BRONCHOSCOPY Right 04/30/2022   Procedure: VIDEO BRONCHOSCOPY WITHOUT FLUORO;  Surgeon: Maryjane Hurter, MD;  [facility];  Service: Pulmonary;  Laterality: Right;       HPI  from the history and physical done on the day of admission:     ***  ****     Hospital Course:     No notes on file  ***** Assessment and Plan: No notes have been filed under this hospital service. Service: Hospitalist       Discharge Condition: ***  Follow UP     Consults obtained - ***  Diet and Activity recommendation:  As advised  Discharge Instructions    **** Discharge Instructions     Call MD for:  difficulty breathing, headache or visual disturbances   Complete by: As directed    Call MD for:  persistant dizziness or light-headedness   Complete by: As directed    Call MD for:  persistant nausea and vomiting   Complete by: As directed    Call MD for:  temperature >100.4    Complete by: As directed    Diet - low sodium heart healthy   Complete by: As directed    Diet Carb Modified   Complete by: As directed    Discharge instructions   Complete by: As directed    1)Stop Glipizide 2)Reduce Levemir insulin to 18 units every evening from 32 units--- 3)Follow up with Dr [name]----  Gastroenterologist  with [facility]---in  2 to 3 weeks  for evaluation  -address: [address], [place], [place] [address], Phone: [phone] -You will need repeat EGD test in about 3 to 4 weeks 4)Repeat CBC and BMP blood tests in 1 week 5)Restart Plavix/clopidogrel   Increase activity slowly   Complete by: As directed          Discharge Medications     Allergies as of 07/27/2022       Reactions   Trazodone Other (See Comments)   Seizure        Medication List     STOP taking these medications    glipiZIDE 2.5 MG 24 hr tablet Commonly known as: GLUCOTROL XL   tadalafil 20 MG tablet Commonly known as: CIALIS       TAKE these medications    acetaminophen 500 MG tablet Commonly known as: TYLENOL Take 1,000 mg by mouth every 6 (six) hours as needed for mild pain or moderate pain.   albuterol 108 (90 Base) MCG/ACT inhaler Commonly known as: VENTOLIN HFA Inhale 2 puffs into the lungs every 4 (four) hours as needed for wheezing or shortness of breath.   amLODipine 10 MG tablet Commonly known as: NORVASC Take 1 tablet (10 mg total) by mouth daily.   Anoro Ellipta 62.5-25 MCG/ACT Aepb Generic drug: umeclidinium-vilanterol Inhale 1 puff into the lungs daily.   aspirin EC 81 MG tablet Take 1 tablet (81 mg total) by mouth daily with breakfast. What changed: when to take this   atorvastatin 40 MG tablet Commonly known as: LIPITOR Take 1 tablet (40 mg total) by mouth daily.   cholecalciferol 25 MCG (1000 UNIT) tablet Commonly known as: VITAMIN D3 Take 1,000 Units by mouth daily.   clopidogrel 75 MG tablet Commonly known as:  PLAVIX Take 1 tablet (75 mg total) by mouth daily.   ferrous sulfate 325 (65 FE) MG tablet Take 1 tablet (325 mg total) by mouth daily with breakfast.   fluconazole 100 MG tablet Commonly known as: DIFLUCAN Take 100 mg by mouth daily.   fluticasone 50 MCG/ACT nasal spray Commonly known as: FLONASE Place 2 sprays into both nostrils daily.   GARLIC PO Take 1 tablet by mouth daily.   HYDROcodone-acetaminophen 7.5-325 MG tablet Commonly known as: NORCO Take 1 tablet by mouth every 6 (six) hours as needed for moderate pain.   Levemir FlexTouch 100 UNIT/ML FlexPen Generic drug: insulin detemir Inject 18 Units into the skin at bedtime. What changed:  how much to take when to take this   levETIRAcetam 500 MG tablet Commonly known as: KEPPRA Take 1 tablet (500 mg total) by mouth 2 (two) times daily.   LORazepam 1 MG tablet Commonly known as: ATIVAN Take 1 mg by mouth every 6 (six) hours as needed for anxiety or sleep.   megestrol 40 MG/ML suspension Commonly known as: MEGACE Take 10 mLs (400 mg total) by mouth daily. What changed: how much to take   metoCLOPramide 10 MG tablet Commonly known as: REGLAN Take 1 tablet (10 mg total) by mouth every 6 (six) hours as needed (Hiccups.).   metoprolol succinate 100 MG 24 hr tablet Commonly known as: Toprol XL Take 1 tablet (100 mg total) by mouth daily. Take with or immediately following a meal.   nystatin cream Commonly known as: MYCOSTATIN Apply to affected area 2 times daily   OVER THE COUNTER MEDICATION Take 10 mLs by mouth 2 (two) times daily. maalox   pantoprazole 40 MG tablet Commonly known as: PROTONIX  Take 1 tablet (40 mg total) by mouth 2 (two) times daily. What changed: when to take this   prochlorperazine 10 MG tablet Commonly known as: COMPAZINE TAKE 1 TABLET(10 MG) BY MOUTH EVERY 6 HOURS AS NEEDED FOR NAUSEA OR VOMITING What changed: See the new instructions.   senna 8.6 MG Tabs tablet Commonly known as:  SENOKOT Take 1 tablet (8.6 mg total) by mouth at bedtime as needed for mild constipation. May be needed as pain medication can constipate.   sildenafil 100 MG tablet Commonly known as: VIAGRA Take 100 mg by mouth daily as needed for erectile dysfunction.   sucralfate 1 g tablet Commonly known as: Carafate Take 1 tablet (1 g total) by mouth 4 (four) times daily -  with meals and at bedtime. May dissolve 1 tab and glass of water and drink as needed What changed:  when to take this reasons to take this   testosterone cypionate 200 MG/ML injection Commonly known as: DEPOTESTOSTERONE CYPIONATE Inject 1.5 mLs into the muscle every 14 (fourteen) days.   tiZANidine 4 MG tablet Commonly known as: ZANAFLEX Take 4 mg by mouth every 6 (six) hours as needed for muscle spasms.        Major procedures and Radiology Reports - PLEASE review detailed and final reports for all details, in brief -   ***  DG Chest Port 1 View  Result Date: 07/25/2022 CLINICAL DATA:  Low blood sugar last night. EXAM: PORTABLE CHEST 1 VIEW COMPARISON:  July 12 2022, chest CT July 10, 2022 FINDINGS: The heart size and mediastinal contours are stable. Stable masslike opacity is identified in the medial right upper lobe/right paratracheal region unchanged. Left central venous lines tendons at distal tip in the superior vena cava unchanged. Both lungs are otherwise clear. The visualized skeletal structures are unremarkable. IMPRESSION: Stable masslike opacity is identified in the medial right upper lobe/right paratracheal region unchanged. Lungs are otherwise clear. Electronically Signed   By: Abelardo Diesel M.D.   On: 07/25/2022 08:57   CT ANGIO HEAD NECK W WO CM (CODE STROKE)  Result Date: 07/25/2022 CLINICAL DATA:  Right-sided weakness with facial droop. Stroke suspected EXAM: CT ANGIOGRAPHY HEAD AND NECK TECHNIQUE: Multidetector CT imaging of the head and neck was performed using the standard protocol during  bolus administration of intravenous contrast. Multiplanar CT image reconstructions and MIPs were obtained to evaluate the vascular anatomy. Carotid stenosis measurements (when applicable) are obtained utilizing NASCET criteria, using the distal internal carotid diameter as the denominator. RADIATION DOSE REDUCTION: This exam was performed according to the departmental dose-optimization program which includes automated exposure control, adjustment of the mA and/or kV according to patient size and/or use of iterative reconstruction technique. CONTRAST:  29mL OMNIPAQUE IOHEXOL 350 MG/ML SOLN COMPARISON:  Head CT from today.  Intracranial MRA 07/12/2022 FINDINGS: CTA NECK FINDINGS Aortic arch: Atherosclerosis with irregular plaque. Two vessel branching. Right carotid system: Atheromatous plaque with stent spanning the distal common carotid to proximal ICA. No in stent stenosis. Left carotid system: Atheromatous wall thickening diffusely with intermittent calcification. No stenosis or ulceration. Vertebral arteries: Proximal subclavian atherosclerosis with advanced low-density plaque at the left vertebral origin. The left vertebral artery is nonenhancing essentially throughout its course. The right vertebral artery is patent with mild atheromatous narrowing at its origin. Distal right vertebral fenestration at the level of C1. Skeleton: Left mandibular fracture/osteotomy fixation without bony bridging but also no sign of mandibular hardware loosening. Generalized cervical spine degeneration. Other neck: Postoperative left lateral neck  and face with flap no aggressive finding. Left mastoid and middle ear opacification, chronic and possibly from prior radiotherapy. Upper chest: Known right upper lobe mass. Review of the MIP images confirms the above findings CTA HEAD FINDINGS Anterior circulation: Atheromatous plaque along the carotid siphons. Moderate diffuse atheromatous irregularity of MCA branches. No branch occlusion  or aneurysm. Posterior circulation: Diseased left vertebral artery. The basilar is primarily filled from the right vertebral artery. Proximal basilar fenestration. Atheromatous irregularity of the basilar which is diminutive due to fetal type bilateral PCA flow. Moderate atheromatous irregularity of the posterior cerebral arteries. Venous sinuses: Negative Anatomic variants: As above Review of the MIP images confirms the above findings IMPRESSION: 1. No emergent finding. 2. 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 Stented right cervical carotid which is widely patent. 4. Moderate generalized atheromatous irregularity of intracranial vessels. Electronically Signed   By: Jorje Guild M.D.   On: 07/25/2022 08:06   CT HEAD CODE STROKE WO CONTRAST  Result Date: 07/25/2022 CLINICAL DATA:  Code stroke.  Right-sided weakness and facial droop EXAM: CT HEAD WITHOUT CONTRAST TECHNIQUE: Contiguous axial images were obtained from the base of the skull through the vertex without intravenous contrast. RADIATION DOSE REDUCTION: This exam was performed according to the departmental dose-optimization program which includes automated exposure control, adjustment of the mA and/or kV according to patient size and/or use of iterative reconstruction technique. COMPARISON:  07/12/2022 FINDINGS: Brain: No evidence of acut generalized cerebral volume loss e infarction, hemorrhage, hydrocephalus, extra-axial collection or mass lesion/mass effect. Vascular: No hyperdense vessel or unexpected calcification. Skull: Normal. Negative for fracture or focal lesion. Sinuses/Orbits: No acute finding. Other: These results were by epic chat at the time of interpretation on 07/25/2022 at 7:46 am to provider ANKIT NANAVATI. ASPECTS [employer] Stroke Program Early CT Score) - Ganglionic level infarction (caudate, lentiform nuclei, internal capsule, insula, M1-M3 cortex): 7 -  Supraganglionic infarction (M4-M6 cortex): 3 Total score (0-10 with 10 being normal): 10 IMPRESSION: 1. Stable head CT.  No acute finding. 2. ASPECTS is 10. Electronically Signed   By: Jorje Guild M.D.   On: 07/25/2022 07:46   MR ANGIO HEAD WO CONTRAST  Result Date: 07/14/2022 CLINICAL DATA:  MRA recommended due to bulbous appearance of the proximal basilar artery EXAM: MRA HEAD WITHOUT CONTRAST TECHNIQUE: Angiographic images of the Circle of Willis were acquired using MRA technique without intravenous contrast. COMPARISON:  No prior MRA, correlation is made with MRI head 07/13/2022 FINDINGS: Evaluation is somewhat limited by susceptibility artifact affecting the left suboccipital region. No restricted diffusion to suggest acute or subacute infarct. Anterior circulation: Both internal carotid arteries are patent to the termini, without significant stenosis. A1 segments patent. Normal anterior communicating artery. Anterior cerebral arteries are patent to their distal aspects. No M1 stenosis or occlusion. Normal MCA bifurcations. Distal MCA branches perfused and symmetric. Posterior circulation: No signal is seen in the proximal left vertebral artery, with some signal seen in what may be a diminutive distal left V4 (series 7, image 56), which may reflect minimal retrograde flow. The right vertebral artery is dominant and patent the vertebrobasilar junction without stenosis. Basilar patent to its distal aspect. Superior cerebellar arteries patent bilaterally. Fetal origin of the right posterior cerebral artery from the right posterior communicating artery, with mild stenosis at the posterior communicating artery origin (series 7, image 122 and near the junction with P2 (series 7, image 129). Patent and diminutive  left P1, with near fetal origin of the left PCA from a prominent left posterior communicating artery. There is also likely mild stenosis at the origin of the left posterior communicating artery  (series 7, image 123). Multifocal mild stenosis in the bilateral PCAs, which are otherwise patent to their distal aspects. Anatomic variants: Fetal origin of the right PCA and near fetal origin of the left PCA IMPRESSION: 1. No signal is seen in the proximal left vertebral artery, with some signal seen in what may be a diminutive distal left V4, which may reflect retrograde flow. This could be further evaluated with CTA head and neck, if clinically indicated. 2. Mild stenosis at the origins of the bilateral posterior communicating arteries, with fetal origin of the right PCA and near fetal origin the left PCA. 3. No other significant stenosis or large vessel occlusion in the anterior circulation. These results will be called to the ordering clinician or representative by the Radiologist Assistant, and communication documented in the PACS or [employer]. Electronically Signed   By: Merilyn Baba M.D.   On: 07/14/2022 00:02   EEG adult  Result Date: 07/13/2022 Lora Havens, MD     07/13/2022  7:57 PM Patient Name: JAYEL INKS MRN: [id number] Epilepsy Attending: Lora Havens Referring Physician/Provider: Reubin Milan, MD Date: 07/13/2022 Duration: 22.36 mins Patient history: 71 y.o. 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 EEG to evaluate for seizure Level of alertness: Awake AEDs during EEG study: None Technical aspects: This EEG study was done with scalp electrodes positioned according to the 10-20 International system of electrode placement. Electrical activity was reviewed with band pass filter of 1-70Hz , sensitivity of 7 uV/mm, display speed of 29mm/sec with a 60Hz  notched filter applied as appropriate. EEG data were recorded continuously and digitally stored.   Video monitoring was available and reviewed as appropriate. Description: The posterior dominant rhythm consists of 8-9Hz  activity of moderate voltage (25-35 uV) seen predominantly in posterior head regions, symmetric and reactive to eye opening and eye closing.  Hyperventilation and photic stimulation were not performed.   IMPRESSION: This study is within normal limits. No seizures or epileptiform discharges were seen throughout the recording. A normal interictal EEG does not exclude the diagnosis of epilepsy. Lora Havens   MR Brain W and Wo Contrast  Result Date: 07/13/2022 CLINICAL DATA:  Provided history: Seizure, new onset, no history of trauma. Additional history provided: History of squamous cell cancer of jaw, non-small cell lung cancer. EXAM: MRI HEAD WITHOUT AND WITH CONTRAST TECHNIQUE: Multiplanar, multiecho pulse sequences of the brain and surrounding structures were obtained without and with intravenous contrast. CONTRAST:  8 mL Gadavist intravenous contrast. COMPARISON:  Head CT 07/12/2022.  Brain MRI 05/14/2022. FINDINGS: Mild intermittent motion degradation. Brain: Mild generalized cerebral atrophy. No cortical encephalomalacia is identified. No significant cerebral white matter disease for age. No appreciable hippocampal size or signal asymmetry. There is no acute infarct. No evidence of an intracranial mass. No chronic intracranial blood products. No extra-axial fluid collection. No midline shift. No pathologic intracranial enhancement identified. Vascular: Redemonstrated developmental venous anomaly within the anterolateral left frontal lobe. Somewhat bulbous appearance of the proximal basilar artery (instance as seen on series 17, image 15) (series 8, image 5). Skull and upper cervical spine:  No focal suspicious marrow lesion. Sinuses/Orbits: No mass or acute finding within the imaged orbits. Chronic medially displaced fracture deformity of the right lamina papyracea. Mild-to-moderate  mucosal thickening within the left maxillary sinus. Mucosal thickening within the bilateral ethmoid air cells (mild-to-moderate right, minimal left). Other: Large left middle ear/mastoid effusion. Incompletely assessed postsurgical changes to the left mandible. IMPRESSION: 1. Mildly motion degraded exam. 2. No evidence of acute intracranial abnormality. 3. No evidence of intracranial metastatic disease. 4. No specific seizure focus is identified. 5. Mild generalized cerebral atrophy. 6. Somewhat bulbous appearance of the proximal basilar artery. MR or CT angiography of the head is recommended to exclude a fusiform aneurysm at this site. 7. Paranasal sinus disease, as described. 8. Large left middle ear/mastoid effusion. Electronically Signed   By: Kellie Simmering D.O.   On: 07/13/2022 08:56   CT Head Wo Contrast  Result Date: 07/12/2022 CLINICAL DATA:  Seizure, new-onset, no history of trauma EXAM: CT HEAD WITHOUT CONTRAST TECHNIQUE: Contiguous axial images were obtained from the base of the skull through the vertex without intravenous contrast. RADIATION DOSE REDUCTION: This exam was performed according to the departmental dose-optimization program which includes automated exposure control, adjustment of the mA and/or kV according to patient size and/or use of iterative reconstruction technique. COMPARISON:  MRI head 05/14/2022 FINDINGS: Brain: Cerebral ventricle sizes are concordant with the degree of cerebral volume loss. No evidence of large-territorial acute infarction. No parenchymal hemorrhage. No mass lesion. No extra-axial collection. No mass effect or midline shift. No hydrocephalus. Basilar cisterns are patent. Vascular: No hyperdense vessel. Skull: No acute fracture or focal lesion. Sinuses/Orbits: Paranasal sinuses and mastoid air cells are clear. The orbits are unremarkable. Other: None. IMPRESSION: No acute intracranial abnormality. Electronically Signed   By: Iven Finn M.D.   On: 07/12/2022  23:51   DG Abdomen Acute W/Chest  Result Date: 07/12/2022 CLINICAL DATA:  Distended abdomen, concern for bowel obstruction. EXAM: DG ABDOMEN ACUTE WITH 1 VIEW CHEST COMPARISON:  07/10/2022. FINDINGS: There is no evidence of dilated bowel loops or free intraperitoneal air. No radiopaque calculi or other acute radiographic abnormality is seen. Heart size and mediastinal contours are stable. There is redemonstration of a masslike consolidation in the right upper lobe at the paramediastinal border. No consolidation, effusion, or pneumothorax. A stable chest port is noted on the left. Surgical clips are present in the cervical soft tissues and left axilla. IMPRESSION: 1. No evidence of bowel obstruction or free air. 2. Stable masslike consolidation in the right upper lobe. . Electronically Signed   By: Brett Fairy M.D.   On: 07/12/2022 23:29   CT Soft Tissue Neck Wo Contrast  Result Date: 07/11/2022 CLINICAL DATA:  Head and neck cancer. Sore throat. Recent chemotherapy and radiation. EXAM: CT NECK WITHOUT CONTRAST TECHNIQUE: Multidetector CT imaging of the neck was performed following the standard protocol without intravenous contrast. RADIATION DOSE REDUCTION: This exam was performed according to the departmental dose-optimization program which includes automated exposure control, adjustment of the mA and/or kV according to patient size and/or use of iterative reconstruction technique. COMPARISON:  04/19/2017 FINDINGS: Pharynx and larynx: Prior left maxillectomy with reconstruction of the left mandible and a left upper neck flap. There is no local residual or recurrent tumor. Pharyngeal structures are normal. Mild thickening of the epiglottis and circumferential thickening at the larynx is likely a sequela of radiation therapy. No retropharyngeal abnormality. Salivary glands: Normal right parotid gland and residual left parotid gland. Submandibular glands are absent. Thyroid: Normal  Lymph nodes: Remote  modified left neck dissection. Vascular: Stent in the right internal carotid artery. Atherosclerotic calcification of the proximal arch vessels and the aortic arch. Limited intracranial: Negative Visualized orbits: Normal Mastoids and visualized paranasal sinuses: Left mastoid and middle ear effusion. Paranasal sinuses are clear. Skeleton: Left mandible reconstruction and partial left maxillectomy. Upper chest: Incompletely visualized medial right upper effusion. Other: None. IMPRESSION: 1. Prior left maxillectomy with reconstruction of the left mandible and a left upper neck flap. No local residual or recurrent tumor. 2. Mild thickening of the epiglottis and circumferential thickening of the larynx is likely a sequela of radiation therapy. 3. Left mastoid and middle ear effusion, also possibly due to radiation. 4. Incompletely visualized right pleural effusion. Aortic Atherosclerosis (ICD10-I70.0). Electronically Signed   By: Ulyses Jarred M.D.   On: 07/11/2022 00:23   CT Chest Wo Contrast  Result Date: 07/11/2022 CLINICAL DATA:  Non-small cell lung cancer.  Pneumonia. EXAM: CT CHEST WITHOUT CONTRAST TECHNIQUE: Multidetector CT imaging of the chest was performed following the standard protocol without IV contrast. RADIATION DOSE REDUCTION: This exam was performed according to the departmental dose-optimization program which includes automated exposure control, adjustment of the mA and/or kV according to patient size and/or use of iterative reconstruction technique. COMPARISON:  Chest x-ray same day. PET-CT 04/12/2022. Chest CT 03/2022. FINDINGS: Cardiovascular: No significant vascular findings. Normal heart size. No pericardial effusion. There are atherosclerotic calcifications of the aorta. Left chest port catheter tip ends at the cavoatrial junction. Mediastinum/Nodes: There is new mild-to-moderate fluid distention of the entire esophagus with some diffuse esophageal wall thickening. The visualized thyroid  gland is within normal limits. No enlarged lymph nodes are identified in the mediastinum or hilar regions allowing for lack of intravenous contrast. Lungs/Pleura: Again seen is right upper lobe bronchus cutoff with complete collapse of the right upper lobe. Masslike density throughout the right upper lobe is not significantly changed. Left lower lobe fissural nodule measuring 5 mm image 4/90 appears unchanged. There is some atelectasis in the lingula, unchanged. No new pulmonary nodules. No pleural effusion or pneumothorax. Upper Abdomen: Left renal cyst is partially visualized. There are calcifications in the tail of the pancreas compatible with chronic pancreatitis, unchanged. Musculoskeletal: There are surgical changes in the left mandible. No acute fracture. No focal osseous lesion. IMPRESSION: 1. New fluid distention of the esophagus with diffuse esophageal wall thickening. Findings are worrisome for esophagitis. 2. Stable right upper lobe bronchus cutoff with complete collapse of the right upper lobe. 3. Stable 5 mm left lower lobe fissural nodule. No new pulmonary nodules. Aortic Atherosclerosis (ICD10-I70.0). Electronically Signed   By: Ronney Asters M.D.   On: 07/11/2022 00:08   DG Chest Port 1 View  Result Date: 07/10/2022 CLINICAL DATA:  Cough. History of cancer. EXAM: PORTABLE CHEST 1 VIEW COMPARISON:  Radiograph 04/30/2022. CT 03/30/2022 FINDINGS: Left chest port remains in place. Partial right upper lobe collapse with slightly improved aeration from prior radiograph. Background emphysema. There may be minimal ill-defined opacity in the left infrahilar region. No pneumothorax or pleural effusion. No pulmonary edema. The heart is normal in size. Stable mediastinal contours. Left neck and axillary surgical clips. IMPRESSION: 1. Questionable ill-defined opacity in the left infrahilar region, atelectasis versus pneumonia. 2. Partial right upper lobe collapse with slightly improved aeration from prior  radiograph, known central right upper lobe mass. 3. Emphysema. Electronically Signed   By: Keith Rake M.D.   On: 07/10/2022 21:57    Micro Results   ***  Recent Results (from the past 240 hour(s))  Blood Culture (routine x 2)     Status: None (Preliminary result)   Collection Time: 07/25/22  8:08 AM   Specimen: Right Antecubital; Blood  Result Value Ref Range Status   Specimen Description   Final    RIGHT ANTECUBITAL BOTTLES DRAWN AEROBIC AND ANAEROBIC   Special Requests Blood Culture adequate volume  Final   Culture   Final    NO GROWTH 2 DAYS Performed at [facility], [address], [place], [place] [address]    Report Status PENDING  Incomplete  Blood Culture (routine x 2)     Status: None (Preliminary result)   Collection Time: 07/25/22  8:13 AM   Specimen: BLOOD RIGHT HAND  Result Value Ref Range Status   Specimen Description   Final    BLOOD RIGHT HAND BOTTLES DRAWN AEROBIC AND ANAEROBIC   Special Requests   Final    Blood Culture results may not be optimal due to an excessive volume of blood received in culture bottles   Culture   Final    NO GROWTH 2 DAYS Performed at [facility], [address] [place], [place] [address]    Report Status PENDING  Incomplete  Resp Panel by RT-PCR (Flu A&B, Covid) Urine, Clean Catch     Status: None   Collection Time: 07/25/22  8:29 AM   Specimen: Urine, Clean Catch; Nasal Swab  Result Value Ref Range Status   SARS Coronavirus 2 by RT PCR NEGATIVE NEGATIVE Final    Comment: (NOTE) SARS-CoV-2 target nucleic acids are NOT DETECTED.  The SARS-CoV-2 RNA is generally detectable in upper respiratory specimens during the acute phase of infection. The lowest concentration of SARS-CoV-2 viral copies this assay can detect is 138 copies/mL. A negative result does not preclude SARS-Cov-2 infection and should not be used as the sole basis for treatment or other patient management decisions. A negative result may occur with  improper  specimen collection/handling, submission of specimen other than nasopharyngeal swab, presence of viral mutation(s) within the areas targeted by this assay, and inadequate number of viral copies(<138 copies/mL). A negative result must be combined with clinical observations, patient history, and epidemiological information. The expected result is Negative.  Fact Sheet for Patients:  EntrepreneurPulse.com.au  Fact Sheet for Healthcare Providers:  IncredibleEmployment.be  This test is no t yet approved or cleared by the [place] FDA and  has been authorized for detection and/or diagnosis of SARS-CoV-2 by FDA under an Emergency Use Authorization (EUA). This EUA will remain  in effect (meaning this test can be used) for the duration of the COVID-19 declaration under Section 564(b)(1) of the Act, 21 U.S.C.section 360bbb-3(b)(1), unless the authorization is terminated  or revoked sooner.       Influenza A by PCR NEGATIVE NEGATIVE Final   Influenza B by PCR NEGATIVE NEGATIVE Final    Comment: (NOTE) The Xpert Xpress SARS-CoV-2/FLU/RSV plus assay is intended as an aid in the diagnosis of influenza from Nasopharyngeal swab specimens and should not be used as a sole basis for treatment. Nasal washings and aspirates are unacceptable for Xpert Xpress SARS-CoV-2/FLU/RSV testing.  Fact Sheet for Patients: EntrepreneurPulse.com.au  Fact Sheet for Healthcare Providers: IncredibleEmployment.be  This test is not yet approved or cleared by the [place] FDA and has been authorized for detection and/or diagnosis of SARS-CoV-2 by FDA under an Emergency Use Authorization (EUA). This EUA will remain in effect (meaning this test can be used) for the duration  of the COVID-19 declaration under Section 564(b)(1) of the Act, 21 U.S.C. section 360bbb-3(b)(1), unless the authorization is terminated or revoked.  Performed at  [facility], [address], [place], [place] [address]   Urine Culture     Status: Abnormal   Collection Time: 07/25/22  8:29 AM   Specimen: Urine, Catheterized  Result Value Ref Range Status   Specimen Description   Final    URINE, CATHETERIZED Performed at [facility], [address], [place], [place] [address]    Special Requests   Final    NONE Performed at [facility], [address], [place], [place] [address]    Culture MULTIPLE SPECIES PRESENT, SUGGEST RECOLLECTION (A)  Final   Report Status 07/26/2022 FINAL  Final  KOH prep     Status: None   Collection Time: 07/26/22 11:53 AM   Specimen: PATH Cytology brushing; Body Fluid  Result Value Ref Range Status   Specimen Description ESOPHAGUS  Final   Special Requests NONE  Final   KOH Prep   Final    NO YEAST OR FUNGAL ELEMENTS SEEN Performed at [facility], [address] [place], [place] [address]    Report Status 07/26/2022 FINAL  Final    Today   Subjective    Ileana Roup today has no ***          Patient has been seen and examined prior to discharge   Objective   Blood pressure (!) 166/94, pulse (!) 110, temperature 98.5 F (36.9 C), resp. rate 14, height 5\' 11"  (1.803 m), weight 78 kg, SpO2 100 %.   Intake/Output Summary (Last 24 hours) at 07/27/2022 1026 Last data filed at 07/27/2022 0900 Gross per 24 hour  Intake 1560.17 ml  Output 650 ml  Net 910.17 ml    Exam Gen:- Awake Alert, no acute distress *** HEENT:- [place].AT, No sclera icterus Neck-Supple Neck,No JVD,.  Lungs-  CTAB , good air movement bilaterally CV- S1, S2 normal, regular Abd-  +ve B.Sounds, Abd Soft, No tenderness,    Extremity/Skin:- No  edema,   good pulses Psych-affect is appropriate, oriented x3 Neuro-no new focal deficits, no tremors ***   Data Review   CBC w Diff:  Lab Results  Component Value Date   WBC 11.3 (H) 07/27/2022   HGB 10.0 (L) 07/27/2022   HGB 10.6 (L) 07/05/2022   HCT 31.6 (L) 07/27/2022   PLT 352  07/27/2022   PLT 169 07/05/2022   LYMPHOPCT 3 07/25/2022   MONOPCT 8 07/25/2022   EOSPCT 0 07/25/2022   BASOPCT 0 07/25/2022    CMP:  Lab Results  Component Value Date   NA 136 07/27/2022   K 4.4 07/27/2022   CL 100 07/27/2022   CO2 26 07/27/2022   BUN 20 07/27/2022   CREATININE 1.45 (H) 07/27/2022   CREATININE 2.28 (H) 07/05/2022   PROT 8.2 (H) 07/25/2022   ALBUMIN 3.8 07/25/2022   BILITOT 0.1 (L) 07/25/2022   BILITOT 0.3 07/05/2022   ALKPHOS 87 07/25/2022   AST 55 (H) 07/25/2022   AST 16 07/05/2022   ALT 23 07/25/2022   ALT 13 07/05/2022  .  Total Discharge time is about 33 minutes  Roxan Hockey M.D on 07/27/2022 at 10:26 AM  Go to [email or web address] -  for contact info  [facility] - Office  [phone]

## 2022-07-27 NOTE — Progress Notes (Unsigned)
[place] OFFICE PROGRESS NOTE  [name], MD [place] [address]  DIAGNOSIS: Stage IIIc (T4, N3, M0) non-small cell lung cancer, squamous cell carcinoma presented with large right upper lobe lung mass in addition to mediastinal and bilateral hilar lymphadenopathy diagnosed in September 2023.    PRIOR THERAPY: A course of concurrent chemoradiation with weekly carboplatin for AUC of 2 and paclitaxel 45 Mg/M2.  Last dose on 07/05/22. Status post 6 cycles.    CURRENT THERAPY: Consolidation immunotherapy with Imfinzi 1500 mg IV every 4 weeks.  First dose on 08/04/22  INTERVAL HISTORY: Colin Wagner 71 y.o. male returns to the clinic today for a follow-up visit accompanied by his girlfriend.  The patient has a history of head neck cancer.  A couple months ago in September 2023, the patient was diagnosed with non-small cell lung cancer.  He completed a course of concurrent chemoradiation.  The patient had a challenging time with treatment secondary to radiation esophagitis.  His last dose of chemotherapy was on 07/05/2022.  In the interval since completing treatment, the patient was hospitalized on 2 encounters.  He presented to the emergency room on 07/13/2022 for suspicious seizures. His CT was negative for acute intracranial activity. MRI or CT was performed to exclude fusiform aneurysm. He received keppra. EEG negative for seizure like activity. He was discharged to follow up outpatient with neurology and he has an appointment on 08/24/21.   He follows with Dr. [name] from GI. He presented again to the ER on 12/3 for code stroke. However, his symptoms were found to be secondary to hypoglycemia.  He also had EGD on 07/26/22 which showed moderately severe erosive esophagitis with bleeding. He is to continue carafate and protonix. His Plavix is on hold.   Otherwise, he is feeling fine today.  He denies any fever, chills, or night sweats.  His swallowing is  improving and no longer hurts to swallow.  Reports his breathing is "good".  He reports his dyspnea on exertion is better compared to prior.  He still continues to have a mild cough but it is little bit better compared to prior.  Denies any hemoptysis.  Denies any nausea, vomiting, diarrhea, or constipation.  Denies any headache or visual changes.    He had a restaging CT of the chest he was in the hospital which was performed approximately 1 week after completing treatment. He is here for a more detailed discussion about his current condition and recommended treatment options.     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    Right shoulder pain    Testicular hypofunction     ALLERGIES:  is allergic to trazodone.  MEDICATIONS:  Current Outpatient Medications  Medication Sig Dispense Refill   acetaminophen (TYLENOL) 500 MG tablet Take 1,000 mg by mouth every 6 (six) hours as needed for mild pain or moderate pain.     albuterol (VENTOLIN HFA) 108 (90 Base) MCG/ACT inhaler Inhale 2 puffs into the lungs every 4 (four) hours as needed for wheezing or shortness of breath.     amLODipine (NORVASC) 10 MG tablet Take 1 tablet (10 mg total) by mouth  daily. 30 tablet 5   aspirin EC 81 MG tablet Take 1 tablet (81 mg total) by mouth daily with breakfast. 30 tablet 11   atorvastatin (LIPITOR) 40 MG tablet Take 1 tablet (40 mg total) by mouth daily. 30 tablet 5   cholecalciferol (VITAMIN D) 25 MCG (1000 UNIT) tablet Take 1,000 Units by mouth daily.     clopidogrel (PLAVIX) 75 MG tablet Take 1 tablet (75 mg total) by mouth daily. 30 tablet 5   ferrous sulfate 325 (65 FE) MG tablet Take 1 tablet (325 mg total) by mouth daily with breakfast. 30 tablet 3   fluconazole (DIFLUCAN) 100 MG tablet Take 100 mg by mouth daily.     fluticasone (FLONASE) 50 MCG/ACT nasal spray Place 2 sprays into both nostrils daily.     GARLIC PO Take 1 tablet by mouth daily.     HYDROcodone-acetaminophen (NORCO) 7.5-325 MG tablet Take 1 tablet by mouth every 6 (six) hours as needed for moderate pain.     LEVEMIR FLEXTOUCH 100 UNIT/ML FlexTouch Pen Inject 18 Units into the skin at bedtime. 15 mL 11   levETIRAcetam (KEPPRA) 500 MG tablet Take 1 tablet (500 mg total) by mouth 2 (two) times daily. 60 tablet 1   LORazepam (ATIVAN) 1 MG tablet Take 1 mg by mouth every 6 (six) hours as needed for anxiety or sleep.  5   megestrol (MEGACE) 40 MG/ML suspension Take 10 mLs (400 mg total) by mouth daily. 480 mL 3   metoCLOPramide (REGLAN) 10 MG tablet Take 1 tablet (10 mg total) by mouth every 6 (six) hours as needed (Hiccups.). 20 tablet 1   metoprolol succinate (TOPROL XL) 100 MG 24 hr tablet Take 1 tablet (100 mg total) by mouth daily. Take with or immediately following a meal. 30 tablet 5   nystatin cream (MYCOSTATIN) Apply to affected area 2 times daily 80 g 0   OVER THE COUNTER MEDICATION Take 10 mLs by mouth 2 (two) times daily. maalox     pantoprazole (PROTONIX) 40 MG tablet Take 1 tablet (40 mg total) by mouth 2 (two) times daily. 60 tablet 3   prochlorperazine (COMPAZINE) 10 MG tablet TAKE 1 TABLET(10 MG) BY MOUTH EVERY 6 HOURS AS NEEDED FOR NAUSEA OR VOMITING (Patient taking  differently: Take 10 mg by mouth every 6 (six) hours as needed for vomiting or nausea.) 30 tablet 2   senna (SENOKOT) 8.6 MG TABS tablet Take 1 tablet (8.6 mg total) by mouth at bedtime as needed for mild constipation. May be needed as pain medication can constipate. 120 each 1   sildenafil (VIAGRA) 100 MG tablet Take 100 mg by mouth daily as needed for erectile dysfunction.     sucralfate (CARAFATE) 1 g tablet Take 1 tablet (1 g total) by mouth 4 (four) times daily -  with meals and at bedtime.  May dissolve 1 tab and glass of water and drink as needed 120 tablet 1   testosterone cypionate (DEPOTESTOSTERONE CYPIONATE) 200 MG/ML injection Inject 1.5 mLs into the muscle every 14 (fourteen) days.     tiZANidine (ZANAFLEX) 4 MG tablet Take 4 mg by mouth every 6 (six) hours as needed for muscle spasms.     umeclidinium-vilanterol (ANORO ELLIPTA) 62.5-25 MCG/ACT AEPB Inhale 1 puff into the lungs daily. 60 each 5   No current facility-administered medications for this visit.    SURGICAL HISTORY:  Past Surgical History:  Procedure Laterality Date   BALLOON DILATION  12/31/2021   Procedure: BALLOON DILATION;  Surgeon: [name], MD;  [facility];  Service: Endoscopy;;   BIOPSY  04/30/2022   Procedure: BIOPSY;  Surgeon: Maryjane Hurter, MD;  [facility];  Service: Pulmonary;;   CAROTID STENT  06/2018   COLONOSCOPY  2003   Dr. [name], polyps   COLONOSCOPY  2005   Dr. [name], multiple diverticula   COLONOSCOPY  2008   Dr. [name], diverticulosis   COLONOSCOPY WITH PROPOFOL N/A 10/21/2016   Dr. [name]: Diverticulosis, two 5-7 mm polyps removed. path-tubular adenomas.  Next colonoscopy in 5 years.   COLONOSCOPY WITH PROPOFOL N/A 12/31/2021   Procedure: COLONOSCOPY WITH PROPOFOL;  Surgeon: [name], MD;  [facility];  Service: Endoscopy;  Laterality: N/A;  8:15am   ENDOBRONCHIAL ULTRASOUND Right 04/30/2022   Procedure: ENDOBRONCHIAL  ULTRASOUND;  Surgeon: Maryjane Hurter, MD;  [facility];  Service: Pulmonary;  Laterality: Right;   ESOPHAGEAL BRUSHING  07/26/2022   Procedure: ESOPHAGEAL BRUSHING;  Surgeon: [name], DO;  [facility];  Service: Endoscopy;;   ESOPHAGEAL DILATION N/A 07/26/2022   Procedure: ESOPHAGEAL DILATION;  Surgeon: [name], DO;  [facility];  Service: Endoscopy;  Laterality: N/A;   ESOPHAGOGASTRODUODENOSCOPY     Multiple EGDs. 1999 EGD showed gastric ulcers, no H pylori and benign biopsies performed by Dr. [name]. 2001 gastric ulcer healed. Last EGD 2005 had gastritis.   ESOPHAGOGASTRODUODENOSCOPY (EGD) WITH PROPOFOL N/A 12/31/2021   narrowing/deformity at UES s/p baloon dilation   ESOPHAGOGASTRODUODENOSCOPY (EGD) WITH PROPOFOL N/A 07/26/2022   Procedure: ESOPHAGOGASTRODUODENOSCOPY (EGD) WITH PROPOFOL;  Surgeon: [name], DO;  [facility];  Service: Endoscopy;  Laterality: N/A;   FINE NEEDLE ASPIRATION  04/30/2022   Procedure: FINE NEEDLE ASPIRATION (FNA) LINEAR;  Surgeon: Maryjane Hurter, MD;  [facility];  Service: Pulmonary;;   IR GASTROSTOMY TUBE REMOVAL  02/08/2020   IR IMAGING GUIDED PORT INSERTION  05/26/2022   IR REPLACE G-TUBE SIMPLE WO FLUORO  03/20/2019   IR REPLACE G-TUBE SIMPLE WO FLUORO  09/21/2019   IR REPLC GASTRO/COLONIC TUBE PERCUT W/FLUORO  05/16/2018   Left partial mandibulectomy, Scapular free flap reconstruction, selective neck dissection, tracheotomy, and resection of intraoral palate cancer. Left 03/01/2017   [facility]   LUNG BIOPSY     MOUTH SURGERY     PARTIAL NEPHRECTOMY Left 2012   POLYPECTOMY  10/21/2016   Procedure: POLYPECTOMY;  Surgeon: [name], MD;  [facility];  Service: Endoscopy;;  hepatic flexure x2   POLYPECTOMY  12/31/2021   Procedure: POLYPECTOMY INTESTINAL;  Surgeon: [name], MD;  [facility];  Service:  Endoscopy;;   SURGERY OF LIP  06/2019   lip revision   TRANSURETHRAL RESECTION OF PROSTATE N/A 03/01/2019   Procedure:  TRANSURETHRAL RESECTION OF THE PROSTATE (TURP)WITH CYSTOSCOPY;  Surgeon: Raynelle Bring, MD;  [facility];  Service: Urology;  Laterality: N/A;   VIDEO BRONCHOSCOPY Right 04/30/2022   Procedure: VIDEO BRONCHOSCOPY WITHOUT FLUORO;  Surgeon: Maryjane Hurter, MD;  [facility];  Service: Pulmonary;  Laterality: Right;    REVIEW OF SYSTEMS:   Review of Systems  Constitutional: Negative for appetite change, chills, fatigue, fever and unexpected weight change.  HENT: Negative for mouth sores, nosebleeds, sore throat and trouble swallowing.   Eyes: Negative for eye problems and icterus.  Respiratory: Positive for stable to slightly improved dyspnea on exertion and cough.  Negative for hemoptysis and wheezing.   Cardiovascular: Negative for chest pain and leg swelling.  Gastrointestinal: Negative for abdominal pain, constipation, diarrhea, nausea and vomiting.  Genitourinary: Negative for bladder incontinence, difficulty urinating, dysuria, frequency and hematuria.   Musculoskeletal: Negative for back pain, gait problem, neck pain and neck stiffness.  Skin: Negative for itching and rash.  Neurological: Negative for dizziness, extremity weakness, gait problem, headaches, light-headedness and seizures.  Hematological: Negative for adenopathy. Does not bruise/bleed easily.  Psychiatric/Behavioral: Negative for confusion, depression and sleep disturbance. The patient is not nervous/anxious.     PHYSICAL EXAMINATION:  Blood pressure (!) 171/85, pulse (!) 113, temperature 98.4 F (36.9 C), temperature source Oral, resp. rate 18, weight 167 lb 11.2 oz (76.1 kg), SpO2 97 %.  ECOG PERFORMANCE STATUS: 1  Physical Exam  Constitutional: Oriented to person, place, and time and well-developed, well-nourished, and in no distress. No distress.  HENT:  Head: Normocephalic  and atraumatic. Left sided swelling and drooping- s/p radiation changes hx head/neck cancer  Mouth/Throat: Oropharynx is clear and moist. No oropharyngeal exudate.  Eyes: Conjunctivae are normal. Right eye exhibits no discharge. Left eye exhibits no discharge. No scleral icterus.  Neck: Normal range of motion. Neck supple.  Cardiovascular: Normal rate, regular rhythm, normal heart sounds and intact distal pulses.   Pulmonary/Chest: Effort normal and breath sounds normal. No respiratory distress. No wheezes. No rales.  Abdominal: Soft. Bowel sounds are normal. Exhibits no distension and no mass. There is no tenderness.  Musculoskeletal: Normal range of motion. Exhibits no edema.  Lymphadenopathy:    No cervical adenopathy.  Neurological: Alert and oriented to person, place, and time. Exhibits normal muscle tone. Gait normal. Coordination normal.  Skin: Skin is warm and dry. No rash noted. Not diaphoretic. No erythema. No pallor.  Psychiatric: Mood, memory and judgment normal.  Vitals reviewed.  LABORATORY DATA: Lab Results  Component Value Date   WBC 7.3 07/28/2022   HGB 10.1 (L) 07/28/2022   HCT 31.0 (L) 07/28/2022   MCV 81.8 07/28/2022   PLT 395 07/28/2022      Chemistry      Component Value Date/Time   NA 133 (L) 07/28/2022 1442   K 4.7 07/28/2022 1442   CL 98 07/28/2022 1442   CO2 25 07/28/2022 1442   BUN 28 (H) 07/28/2022 1442   CREATININE 2.12 (H) 07/28/2022 1442      Component Value Date/Time   CALCIUM 9.7 07/28/2022 1442   ALKPHOS 86 07/28/2022 1442   AST 22 07/28/2022 1442   ALT 15 07/28/2022 1442   BILITOT 0.4 07/28/2022 1442       RADIOGRAPHIC STUDIES:  DG Chest Port 1 View  Result Date: 07/25/2022 CLINICAL DATA:  Low blood sugar last night. EXAM: PORTABLE CHEST 1 VIEW COMPARISON:  July 12 2022, chest CT July 10, 2022 FINDINGS: The heart size and  mediastinal contours are stable. Stable masslike opacity is identified in the medial right upper  lobe/right paratracheal region unchanged. Left central venous lines tendons at distal tip in the superior vena cava unchanged. Both lungs are otherwise clear. The visualized skeletal structures are unremarkable. IMPRESSION: Stable masslike opacity is identified in the medial right upper lobe/right paratracheal region unchanged. Lungs are otherwise clear. Electronically Signed   By: Abelardo Diesel M.D.   On: 07/25/2022 08:57   CT ANGIO HEAD NECK W WO CM (CODE STROKE)  Result Date: 07/25/2022 CLINICAL DATA:  Right-sided weakness with facial droop. Stroke suspected EXAM: CT ANGIOGRAPHY HEAD AND NECK TECHNIQUE: Multidetector CT imaging of the head and neck was performed using the standard protocol during bolus administration of intravenous contrast. Multiplanar CT image reconstructions and MIPs were obtained to evaluate the vascular anatomy. Carotid stenosis measurements (when applicable) are obtained utilizing NASCET criteria, using the distal internal carotid diameter as the denominator. RADIATION DOSE REDUCTION: This exam was performed according to the departmental dose-optimization program which includes automated exposure control, adjustment of the mA and/or kV according to patient size and/or use of iterative reconstruction technique. CONTRAST:  61mL OMNIPAQUE IOHEXOL 350 MG/ML SOLN COMPARISON:  Head CT from today.  Intracranial MRA 07/12/2022 FINDINGS: CTA NECK FINDINGS Aortic arch: Atherosclerosis with irregular plaque. Two vessel branching. Right carotid system: Atheromatous plaque with stent spanning the distal common carotid to proximal ICA. No in stent stenosis. Left carotid system: Atheromatous wall thickening diffusely with intermittent calcification. No stenosis or ulceration. Vertebral arteries: Proximal subclavian atherosclerosis with advanced low-density plaque at the left vertebral origin. The left vertebral artery is nonenhancing essentially throughout its course. The right vertebral artery is  patent with mild atheromatous narrowing at its origin. Distal right vertebral fenestration at the level of C1. Skeleton: Left mandibular fracture/osteotomy fixation without bony bridging but also no sign of mandibular hardware loosening. Generalized cervical spine degeneration. Other neck: Postoperative left lateral neck and face with flap no aggressive finding. Left mastoid and middle ear opacification, chronic and possibly from prior radiotherapy. Upper chest: Known right upper lobe mass. Review of the MIP images confirms the above findings CTA HEAD FINDINGS Anterior circulation: Atheromatous plaque along the carotid siphons. Moderate diffuse atheromatous irregularity of MCA branches. No branch occlusion or aneurysm. Posterior circulation: Diseased left vertebral artery. The basilar is primarily filled from the right vertebral artery. Proximal basilar fenestration. Atheromatous irregularity of the basilar which is diminutive due to fetal type bilateral PCA flow. Moderate atheromatous irregularity of the posterior cerebral arteries. Venous sinuses: Negative Anatomic variants: As above Review of the MIP images confirms the above findings IMPRESSION: 1. No emergent finding. 2. 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 Stented right cervical carotid which is widely patent. 4. Moderate generalized atheromatous irregularity of intracranial vessels. Electronically Signed   By: Jorje Guild M.D.   On: 07/25/2022 08:06   CT HEAD CODE STROKE WO CONTRAST  Result Date: 07/25/2022 CLINICAL DATA:  Code stroke.  Right-sided weakness and facial droop EXAM: CT HEAD WITHOUT CONTRAST TECHNIQUE: Contiguous axial images were obtained from the base of the skull through the vertex without intravenous contrast. RADIATION DOSE REDUCTION: This exam was performed according to the departmental dose-optimization program which includes automated exposure control,  adjustment of the mA and/or kV according to patient size and/or use of iterative reconstruction technique. COMPARISON:  07/12/2022 FINDINGS: Brain: No evidence of acut generalized cerebral volume loss e infarction, hemorrhage,  hydrocephalus, extra-axial collection or mass lesion/mass effect. Vascular: No hyperdense vessel or unexpected calcification. Skull: Normal. Negative for fracture or focal lesion. Sinuses/Orbits: No acute finding. Other: These results were by epic chat at the time of interpretation on 07/25/2022 at 7:46 am to provider ANKIT NANAVATI. ASPECTS [facility] Stroke Program Early CT Score) - Ganglionic level infarction (caudate, lentiform nuclei, internal capsule, insula, M1-M3 cortex): 7 - Supraganglionic infarction (M4-M6 cortex): 3 Total score (0-10 with 10 being normal): 10 IMPRESSION: 1. Stable head CT.  No acute finding. 2. ASPECTS is 10. Electronically Signed   By: Jorje Guild M.D.   On: 07/25/2022 07:46   MR ANGIO HEAD WO CONTRAST  Result Date: 07/14/2022 CLINICAL DATA:  MRA recommended due to bulbous appearance of the proximal basilar artery EXAM: MRA HEAD WITHOUT CONTRAST TECHNIQUE: Angiographic images of the Circle of Willis were acquired using MRA technique without intravenous contrast. COMPARISON:  No prior MRA, correlation is made with MRI head 07/13/2022 FINDINGS: Evaluation is somewhat limited by susceptibility artifact affecting the left suboccipital region. No restricted diffusion to suggest acute or subacute infarct. Anterior circulation: Both internal carotid arteries are patent to the termini, without significant stenosis. A1 segments patent. Normal anterior communicating artery. Anterior cerebral arteries are patent to their distal aspects. No M1 stenosis or occlusion. Normal MCA bifurcations. Distal MCA branches perfused and symmetric. Posterior circulation: No signal is seen in the proximal left vertebral artery, with some signal seen in what may be a diminutive distal  left V4 (series 7, image 56), which may reflect minimal retrograde flow. The right vertebral artery is dominant and patent the vertebrobasilar junction without stenosis. Basilar patent to its distal aspect. Superior cerebellar arteries patent bilaterally. Fetal origin of the right posterior cerebral artery from the right posterior communicating artery, with mild stenosis at the posterior communicating artery origin (series 7, image 122 and near the junction with P2 (series 7, image 129). Patent and diminutive left P1, with near fetal origin of the left PCA from a prominent left posterior communicating artery. There is also likely mild stenosis at the origin of the left posterior communicating artery (series 7, image 123). Multifocal mild stenosis in the bilateral PCAs, which are otherwise patent to their distal aspects. Anatomic variants: Fetal origin of the right PCA and near fetal origin of the left PCA IMPRESSION: 1. No signal is seen in the proximal left vertebral artery, with some signal seen in what may be a diminutive distal left V4, which may reflect retrograde flow. This could be further evaluated with CTA head and neck, if clinically indicated. 2. Mild stenosis at the origins of the bilateral posterior communicating arteries, with fetal origin of the right PCA and near fetal origin the left PCA. 3. No other significant stenosis or large vessel occlusion in the anterior circulation. These results will be called to the ordering clinician or representative by the Radiologist Assistant, and communication documented in the PACS or [employer]. Electronically Signed   By: Merilyn Baba M.D.   On: 07/14/2022 00:02   EEG adult  Result Date: 07/13/2022 Lora Havens, MD     07/13/2022  7:57 PM Patient Name: ONYX SCHIRMER MRN: [id number] Epilepsy Attending: Lora Havens Referring Physician/Provider: Reubin Milan, MD Date: 07/13/2022 Duration: 22.36 mins Patient history: 71 y.o. 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 EEG to evaluate for seizure Level of alertness: Awake AEDs during EEG study: None Technical aspects: This EEG study was done with scalp electrodes positioned according to the 10-20 International system of electrode placement. Electrical activity was reviewed with band pass filter of 1-70Hz , sensitivity of 7 uV/mm, display speed of 11mm/sec with a 60Hz  notched filter applied as appropriate. EEG data were recorded continuously and digitally stored.  Video monitoring was available and reviewed as appropriate. Description: The posterior dominant rhythm consists of 8-9Hz  activity of moderate voltage (25-35 uV) seen predominantly in posterior head regions, symmetric and reactive to eye opening and eye closing.  Hyperventilation and photic stimulation were not performed.   IMPRESSION: This study is within normal limits. No seizures or epileptiform discharges were seen throughout the recording. A normal interictal EEG does not exclude the diagnosis of epilepsy. Lora Havens   MR Brain W and Wo Contrast  Result Date: 07/13/2022 CLINICAL DATA:  Provided history: Seizure, new onset, no history of trauma. Additional history provided: History of squamous cell cancer of jaw, non-small cell lung cancer. EXAM: MRI HEAD WITHOUT AND WITH CONTRAST TECHNIQUE: Multiplanar, multiecho pulse sequences of the brain and surrounding structures were obtained without and with intravenous contrast. CONTRAST:  8 mL Gadavist intravenous contrast. COMPARISON:  Head CT 07/12/2022.  Brain MRI 05/14/2022. FINDINGS: Mild intermittent motion degradation. Brain: Mild generalized cerebral atrophy. No cortical encephalomalacia is identified. No significant cerebral white matter disease for age. No  appreciable hippocampal size or signal asymmetry. There is no acute infarct. No evidence of an intracranial mass. No chronic intracranial blood products. No extra-axial fluid collection. No midline shift. No pathologic intracranial enhancement identified. Vascular: Redemonstrated developmental venous anomaly within the anterolateral left frontal lobe. Somewhat bulbous appearance of the proximal basilar artery (instance as seen on series 17, image 15) (series 8, image 5). Skull and upper cervical spine: No focal suspicious marrow lesion. Sinuses/Orbits: No mass or acute finding within the imaged orbits. Chronic medially displaced fracture deformity of the right lamina papyracea. Mild-to-moderate mucosal thickening within the left maxillary sinus. Mucosal thickening within the bilateral ethmoid air cells (mild-to-moderate right, minimal left). Other: Large left middle ear/mastoid effusion. Incompletely assessed postsurgical changes to the left mandible. IMPRESSION: 1. Mildly motion degraded exam. 2. No evidence of acute intracranial abnormality. 3. No evidence of intracranial metastatic disease. 4. No specific seizure focus is identified. 5. Mild generalized cerebral atrophy. 6. Somewhat bulbous appearance of the proximal basilar artery. MR or CT angiography of the head is recommended to exclude a fusiform aneurysm at this site. 7. Paranasal sinus disease, as described. 8. Large left middle ear/mastoid effusion. Electronically Signed   By: Kellie Simmering D.O.   On: 07/13/2022 08:56   CT Head Wo Contrast  Result Date: 07/12/2022 CLINICAL DATA:  Seizure, new-onset, no history of trauma EXAM: CT HEAD WITHOUT CONTRAST TECHNIQUE: Contiguous axial images were obtained from the base of the skull through the vertex without intravenous contrast. RADIATION DOSE REDUCTION: This exam was performed according to the departmental dose-optimization program which includes automated exposure control, adjustment of the mA and/or kV  according to patient size and/or use of iterative reconstruction technique. COMPARISON:  MRI head 05/14/2022 FINDINGS: Brain: Cerebral ventricle sizes are concordant with the degree of cerebral volume loss. No evidence of large-territorial acute infarction. No parenchymal hemorrhage. No mass lesion. No extra-axial collection. No mass effect or midline shift. No hydrocephalus. Basilar cisterns are patent. Vascular: No hyperdense  vessel. Skull: No acute fracture or focal lesion. Sinuses/Orbits: Paranasal sinuses and mastoid air cells are clear. The orbits are unremarkable. Other: None. IMPRESSION: No acute intracranial abnormality. Electronically Signed   By: Iven Finn M.D.   On: 07/12/2022 23:51   DG Abdomen Acute W/Chest  Result Date: 07/12/2022 CLINICAL DATA:  Distended abdomen, concern for bowel obstruction. EXAM: DG ABDOMEN ACUTE WITH 1 VIEW CHEST COMPARISON:  07/10/2022. FINDINGS: There is no evidence of dilated bowel loops or free intraperitoneal air. No radiopaque calculi or other acute radiographic abnormality is seen. Heart size and mediastinal contours are stable. There is redemonstration of a masslike consolidation in the right upper lobe at the paramediastinal border. No consolidation, effusion, or pneumothorax. A stable chest port is noted on the left. Surgical clips are present in the cervical soft tissues and left axilla. IMPRESSION: 1. No evidence of bowel obstruction or free air. 2. Stable masslike consolidation in the right upper lobe. . Electronically Signed   By: Brett Fairy M.D.   On: 07/12/2022 23:29   CT Soft Tissue Neck Wo Contrast  Result Date: 07/11/2022 CLINICAL DATA:  Head and neck cancer. Sore throat. Recent chemotherapy and radiation. EXAM: CT NECK WITHOUT CONTRAST TECHNIQUE: Multidetector CT imaging of the neck was performed following the standard protocol without intravenous contrast. RADIATION DOSE REDUCTION: This exam was performed according to the departmental  dose-optimization program which includes automated exposure control, adjustment of the mA and/or kV according to patient size and/or use of iterative reconstruction technique. COMPARISON:  04/19/2017 FINDINGS: Pharynx and larynx: Prior left maxillectomy with reconstruction of the left mandible and a left upper neck flap. There is no local residual or recurrent tumor. Pharyngeal structures are normal. Mild thickening of the epiglottis and circumferential thickening at the larynx is likely a sequela of radiation therapy. No retropharyngeal abnormality. Salivary glands: Normal right parotid gland and residual left parotid gland. Submandibular glands are absent. Thyroid: Normal Lymph nodes: Remote modified left neck dissection. Vascular: Stent in the right internal carotid artery. Atherosclerotic calcification of the proximal arch vessels and the aortic arch. Limited intracranial: Negative Visualized orbits: Normal Mastoids and visualized paranasal sinuses: Left mastoid and middle ear effusion. Paranasal sinuses are clear. Skeleton: Left mandible reconstruction and partial left maxillectomy. Upper chest: Incompletely visualized medial right upper effusion. Other: None. IMPRESSION: 1. Prior left maxillectomy with reconstruction of the left mandible and a left upper neck flap. No local residual or recurrent tumor. 2. Mild thickening of the epiglottis and circumferential thickening of the larynx is likely a sequela of radiation therapy. 3. Left mastoid and middle ear effusion, also possibly due to radiation. 4. Incompletely visualized right pleural effusion. Aortic Atherosclerosis (ICD10-I70.0). Electronically Signed   By: Ulyses Jarred M.D.   On: 07/11/2022 00:23   CT Chest Wo Contrast  Result Date: 07/11/2022 CLINICAL DATA:  Non-small cell lung cancer.  Pneumonia. EXAM: CT CHEST WITHOUT CONTRAST TECHNIQUE: Multidetector CT imaging of the chest was performed following the standard protocol without IV contrast.  RADIATION DOSE REDUCTION: This exam was performed according to the departmental dose-optimization program which includes automated exposure control, adjustment of the mA and/or kV according to patient size and/or use of iterative reconstruction technique. COMPARISON:  Chest x-ray same day. PET-CT 04/12/2022. Chest CT 03/2022. FINDINGS: Cardiovascular: No significant vascular findings. Normal heart size. No pericardial effusion. There are atherosclerotic calcifications of the aorta. Left chest port catheter tip ends at the cavoatrial junction. Mediastinum/Nodes: There is new mild-to-moderate fluid distention of the entire esophagus with  some diffuse esophageal wall thickening. The visualized thyroid gland is within normal limits. No enlarged lymph nodes are identified in the mediastinum or hilar regions allowing for lack of intravenous contrast. Lungs/Pleura: Again seen is right upper lobe bronchus cutoff with complete collapse of the right upper lobe. Masslike density throughout the right upper lobe is not significantly changed. Left lower lobe fissural nodule measuring 5 mm image 4/90 appears unchanged. There is some atelectasis in the lingula, unchanged. No new pulmonary nodules. No pleural effusion or pneumothorax. Upper Abdomen: Left renal cyst is partially visualized. There are calcifications in the tail of the pancreas compatible with chronic pancreatitis, unchanged. Musculoskeletal: There are surgical changes in the left mandible. No acute fracture. No focal osseous lesion. IMPRESSION: 1. New fluid distention of the esophagus with diffuse esophageal wall thickening. Findings are worrisome for esophagitis. 2. Stable right upper lobe bronchus cutoff with complete collapse of the right upper lobe. 3. Stable 5 mm left lower lobe fissural nodule. No new pulmonary nodules. Aortic Atherosclerosis (ICD10-I70.0). Electronically Signed   By: Ronney Asters M.D.   On: 07/11/2022 00:08   DG Chest Port 1 View  Result  Date: 07/10/2022 CLINICAL DATA:  Cough. History of cancer. EXAM: PORTABLE CHEST 1 VIEW COMPARISON:  Radiograph 04/30/2022. CT 03/30/2022 FINDINGS: Left chest port remains in place. Partial right upper lobe collapse with slightly improved aeration from prior radiograph. Background emphysema. There may be minimal ill-defined opacity in the left infrahilar region. No pneumothorax or pleural effusion. No pulmonary edema. The heart is normal in size. Stable mediastinal contours. Left neck and axillary surgical clips. IMPRESSION: 1. Questionable ill-defined opacity in the left infrahilar region, atelectasis versus pneumonia. 2. Partial right upper lobe collapse with slightly improved aeration from prior radiograph, known central right upper lobe mass. 3. Emphysema. Electronically Signed   By: Keith Rake M.D.   On: 07/10/2022 21:57     ASSESSMENT/PLAN:  This is a very pleasant 71 year old African-American male diagnosed with stage IIIc (T4, N3, M0) non-small cell lung cancer, squamous cell carcinoma.  The patient presented with a large right upper lobe lung mass in addition to mediastinal and bilateral lymphadenopathy.  The patient was diagnosed in September 2023.   He completed a course of concurrent chemoradiation with weekly carboplatin for an AUC of 2 paclitaxel 45 mg per metered square.  The patient is status post 6 cycles.     The patient recently had a restaging CT scan performed.  Dr. Julien Nordmann personally independently reviewed the scan discussed results with the patient today.  The scan showed stable disease to treatment.  There was not significant movement in his disease; this may be accounted for due to the fact that his scan was performed shortly after completing treatment.  Dr. Julien Nordmann recommended consolidation immunotherapy with Imfinzi 1500 mg IV every 4 weeks.  The patient is interested in this option and he is expected to receive his first cycle of treatment next week on 08/04/22.  I  discussed with him the adverse effect of the immunotherapy including but not limited to immunotherapy mediated skin rash, diarrhea, inflammation of the lung, kidney, liver, thyroid or other endocrine dysfunction  We will see the patient back for follow-up visit in 5 weeks for evaluation repeat blood work before starting cycle #2.  In the meantime, he will continue to follow with GI and neurology due to his recent hospitalizations.  He will continue to take Carafate and Protonix.  The patient has CKD at baseline but he is a  little dehydrated today with tachycardia and some elevated creatinine on his labs.  I offered 1 L fluid over 2 hours to be given IV today.  The patient declined due to his transportation waiting for him outside.  He assured me he would hydrate with water at home.  Will recheck his CMP next week  The patient was advised to call immediately if he has any concerning symptoms in the interval. The patient voices understanding of current disease status and treatment options and is in agreement with the current care plan. All questions were answered. The patient knows to call the clinic with any problems, questions or concerns. We can certainly see the patient much sooner if necessary     Orders Placed This Encounter  Procedures   CBC with Differential ([place] Only)    Standing Status:   Future    Standing Expiration Date:   08/05/2023   CMP ([place] only)    Standing Status:   Future    Standing Expiration Date:   08/05/2023   T4    Standing Status:   Future    Standing Expiration Date:   08/05/2023   TSH    Standing Status:   Future    Standing Expiration Date:   08/05/2023   CBC with Differential ([place] Only)    Standing Status:   Future    Standing Expiration Date:   09/02/2023   CMP ([place] only)    Standing Status:   Future    Standing Expiration Date:   09/02/2023   CBC with Differential ([place] Only)    Standing Status:   Future     Standing Expiration Date:   09/30/2023   CMP ([place] only)    Standing Status:   Future    Standing Expiration Date:   09/30/2023   T4    Standing Status:   Future    Standing Expiration Date:   09/30/2023   TSH    Standing Status:   Future    Standing Expiration Date:   09/30/2023   CBC with Differential ([place] Only)    Standing Status:   Future    Standing Expiration Date:   10/28/2023   CMP ([place] only)    Standing Status:   Future    Standing Expiration Date:   10/28/2023   CBC with Differential ([facility] Only)    Standing Status:   Future    Standing Expiration Date:   11/25/2023   CMP ([place] only)    Standing Status:   Future    Standing Expiration Date:   11/25/2023   CBC with Differential ([facility] Only)    Standing Status:   Future    Standing Expiration Date:   12/23/2023   CMP ([place] only)    Standing Status:   Future    Standing Expiration Date:   12/23/2023   T4    Standing Status:   Future    Standing Expiration Date:   12/23/2023   TSH    Standing Status:   Future    Standing Expiration Date:   12/23/2023      Amyri Frenz L Kaityln Kallstrom, PA-C 07/28/22  ADDENDUM: Hematology/Oncology Attending: I had a face-to-face encounter with the patient today.  I reviewed his records, lab, scan and recommended his care plan.  This is a very pleasant 71 years old African-American male with a stage IIIc non-small cell lung cancer, squamous cell carcinoma status post a course  of concurrent chemoradiation with weekly carboplatin and paclitaxel.  The patient also has a history of head and neck cancer in the past. He was seen in the hospital few times recently with suspicious seizure and he has imaging studies that showed no concerning abnormalities.  His EEG was negative.  He is also followed by gastroenterology in [facility] and he underwent upper endoscopy that showed moderately severe erosive esophagitis with bleeding.  He is  currently on Carafate and Protonix. He had imaging studies 1 week after the completion of his course of concurrent chemoradiation that showed stable disease. I had a lengthy discussion with the patient and his wife today about his current condition and treatment options. I recommended for the patient consideration of consolidation immunotherapy with Imfinzi 1500 Mg IV every 4 weeks.  I discussed with him the adverse effect of this treatment including but not limited to immunotherapy mediated skin rash, diarrhea, inflammation of the lung, kidney, liver, thyroid or other endocrine dysfunction including type 1 diabetes mellitus.  They also understand the benefit of consolidation immunotherapy versus continuous observation and monitoring. The patient is interested in proceeding with this treatment and he is expected to start the first cycle of this treatment next week. He was advised to call immediately if he has any other concerning symptoms in the interval. The total time spent in the appointment was 30 minutes. Disclaimer: This note was dictated with voice recognition software. Similar sounding words can inadvertently be transcribed and may be missed upon review. Eilleen Kempf, MD

## 2022-07-28 ENCOUNTER — Inpatient Hospital Stay: Payer: Medicare Other

## 2022-07-28 ENCOUNTER — Inpatient Hospital Stay: Payer: Medicare Other | Attending: Internal Medicine | Admitting: Physician Assistant

## 2022-07-28 ENCOUNTER — Encounter (HOSPITAL_COMMUNITY): Payer: Self-pay | Admitting: Internal Medicine

## 2022-07-28 ENCOUNTER — Other Ambulatory Visit: Payer: Self-pay

## 2022-07-28 VITALS — BP 171/85 | HR 113 | Temp 98.4°F | Resp 18 | Wt 167.7 lb

## 2022-07-28 DIAGNOSIS — Z95828 Presence of other vascular implants and grafts: Secondary | ICD-10-CM

## 2022-07-28 DIAGNOSIS — N189 Chronic kidney disease, unspecified: Secondary | ICD-10-CM | POA: Diagnosis not present

## 2022-07-28 DIAGNOSIS — Z7189 Other specified counseling: Secondary | ICD-10-CM | POA: Diagnosis not present

## 2022-07-28 DIAGNOSIS — Z5112 Encounter for antineoplastic immunotherapy: Secondary | ICD-10-CM | POA: Diagnosis present

## 2022-07-28 DIAGNOSIS — C3411 Malignant neoplasm of upper lobe, right bronchus or lung: Secondary | ICD-10-CM | POA: Insufficient documentation

## 2022-07-28 DIAGNOSIS — R000 Tachycardia, unspecified: Secondary | ICD-10-CM | POA: Insufficient documentation

## 2022-07-28 DIAGNOSIS — E860 Dehydration: Secondary | ICD-10-CM | POA: Diagnosis not present

## 2022-07-28 DIAGNOSIS — C411 Malignant neoplasm of mandible: Secondary | ICD-10-CM | POA: Diagnosis not present

## 2022-07-28 DIAGNOSIS — Z79899 Other long term (current) drug therapy: Secondary | ICD-10-CM | POA: Insufficient documentation

## 2022-07-28 LAB — CBC WITH DIFFERENTIAL (CANCER CENTER ONLY)
Abs Immature Granulocytes: 0.03 10*3/uL (ref 0.00–0.07)
Basophils Absolute: 0 10*3/uL (ref 0.0–0.1)
Basophils Relative: 0 %
Eosinophils Absolute: 0.1 10*3/uL (ref 0.0–0.5)
Eosinophils Relative: 1 %
HCT: 31 % — ABNORMAL LOW (ref 39.0–52.0)
Hemoglobin: 10.1 g/dL — ABNORMAL LOW (ref 13.0–17.0)
Immature Granulocytes: 0 %
Lymphocytes Relative: 4 %
Lymphs Abs: 0.3 10*3/uL — ABNORMAL LOW (ref 0.7–4.0)
MCH: 26.6 pg (ref 26.0–34.0)
MCHC: 32.6 g/dL (ref 30.0–36.0)
MCV: 81.8 fL (ref 80.0–100.0)
Monocytes Absolute: 0.8 10*3/uL (ref 0.1–1.0)
Monocytes Relative: 11 %
Neutro Abs: 6.1 10*3/uL (ref 1.7–7.7)
Neutrophils Relative %: 84 %
Platelet Count: 395 10*3/uL (ref 150–400)
RBC: 3.79 MIL/uL — ABNORMAL LOW (ref 4.22–5.81)
RDW: 19.8 % — ABNORMAL HIGH (ref 11.5–15.5)
WBC Count: 7.3 10*3/uL (ref 4.0–10.5)
nRBC: 0 % (ref 0.0–0.2)

## 2022-07-28 LAB — CMP (CANCER CENTER ONLY)
ALT: 15 U/L (ref 0–44)
AST: 22 U/L (ref 15–41)
Albumin: 3.2 g/dL — ABNORMAL LOW (ref 3.5–5.0)
Alkaline Phosphatase: 86 U/L (ref 38–126)
Anion gap: 10 (ref 5–15)
BUN: 28 mg/dL — ABNORMAL HIGH (ref 8–23)
CO2: 25 mmol/L (ref 22–32)
Calcium: 9.7 mg/dL (ref 8.9–10.3)
Chloride: 98 mmol/L (ref 98–111)
Creatinine: 2.12 mg/dL — ABNORMAL HIGH (ref 0.61–1.24)
GFR, Estimated: 32 mL/min — ABNORMAL LOW (ref >60)
Glucose, Bld: 198 mg/dL — ABNORMAL HIGH (ref 70–99)
Potassium: 4.7 mmol/L (ref 3.5–5.1)
Sodium: 133 mmol/L — ABNORMAL LOW (ref 135–145)
Total Bilirubin: 0.4 mg/dL (ref 0.3–1.2)
Total Protein: 7.7 g/dL (ref 6.5–8.1)

## 2022-07-28 LAB — CBC
HCT: 31.6 % — ABNORMAL LOW (ref 39.0–52.0)
Hemoglobin: 10 g/dL — ABNORMAL LOW (ref 13.0–17.0)
MCH: 26.3 pg (ref 26.0–34.0)
MCHC: 31.6 g/dL (ref 30.0–36.0)
MCV: 83.2 fL (ref 80.0–100.0)
Platelets: 352 10*3/uL (ref 150–400)
RBC: 3.8 MIL/uL — ABNORMAL LOW (ref 4.22–5.81)
RDW: 19.9 % — ABNORMAL HIGH (ref 11.5–15.5)
WBC: 11.3 10*3/uL — ABNORMAL HIGH (ref 4.0–10.5)
nRBC: 0 % (ref 0.0–0.2)

## 2022-07-28 MED ORDER — HEPARIN SOD (PORK) LOCK FLUSH 100 UNIT/ML IV SOLN
500.0000 [IU] | Freq: Once | INTRAVENOUS | Status: AC
  Administered 2022-07-28: 500 [IU] via INTRAVENOUS

## 2022-07-28 MED ORDER — SODIUM CHLORIDE 0.9% FLUSH
10.0000 mL | INTRAVENOUS | Status: DC | PRN
  Administered 2022-07-28: 10 mL via INTRAVENOUS

## 2022-07-28 NOTE — Patient Instructions (Signed)
-  We covered a lot of important information at your appointment today regarding what the treatment plan is moving forward. Here are the the main points that were discussed at your office visit with [place] today:  -The treatment will consist of a new medication. This is not chemotherapy. This new drug is a type of Immunotherapy called Imfinzi (Durvalumab).  -We are planning on starting your treatment next week on 08/04/22  -Your treatment will be given once every 2 weeks. You will receive this treatment every four weeks for a total of 1 year (13 total treatments) unless you experience unacceptable toxicity or if there is evidence on your routine CT scans that the cancer is growing  -We will get a CT scan after every 3 treatments to check on the progress of treatment -The possible adverse effect of the immunotherapy including but not limited to immunotherapy mediated skin rash, diarrhea, inflammation of the lung, kidney, liver, thyroid or other endocrine dysfunction   Side Effects:  -The adverse effect of the immunotherapy including but not limited to immunotherapy mediated skin rash, diarrhea, inflammation of the lung, kidney, liver, thyroid or other endocrine dysfunction  Follow up:  -We will see you back for a follow up visit in 5 weeks with your second treatment.

## 2022-07-28 NOTE — Progress Notes (Signed)
DISCONTINUE ON PATHWAY REGIMEN - Non-Small Cell Lung     A cycle is every 7 days, concurrent with RT:     Paclitaxel      Carboplatin   **Always confirm dose/schedule in your pharmacy ordering system**  REASON: Continuation Of Treatment PRIOR TREATMENT: [id number]: Carboplatin AUC=2 + Paclitaxel 45 mg/m2 Weekly During Radiation TREATMENT RESPONSE: Stable Disease (SD)  START ON PATHWAY REGIMEN - Non-Small Cell Lung     A cycle is every 28 days:     Durvalumab   **Always confirm dose/schedule in your pharmacy ordering system**  Patient Characteristics: Preoperative or Nonsurgical Candidate (Clinical Staging), Stage III - Nonsurgical Candidate (Nonsquamous and Squamous), PS = 0, 1 Therapeutic Status: Preoperative or Nonsurgical Candidate (Clinical Staging) AJCC T Category: cT3 AJCC N Category: cN3 AJCC M Category: cM0 AJCC 8 Stage Grouping: IIIC ECOG Performance Status: 1 Intent of Therapy: Curative Intent, Discussed with Patient

## 2022-07-30 LAB — CULTURE, BLOOD (ROUTINE X 2)
Culture: NO GROWTH
Culture: NO GROWTH
Special Requests: ADEQUATE

## 2022-08-03 ENCOUNTER — Ambulatory Visit
Admission: EM | Admit: 2022-08-03 | Discharge: 2022-08-03 | Disposition: A | Discharge status: Home / Self Care | Payer: Medicare Other | Attending: Nurse Practitioner | Admitting: Nurse Practitioner

## 2022-08-03 DIAGNOSIS — R1110 Vomiting, unspecified: Secondary | ICD-10-CM | POA: Diagnosis not present

## 2022-08-03 MED ORDER — ALUM & MAG HYDROXIDE-SIMETH 200-200-20 MG/5ML PO SUSP
30.0000 mL | Freq: Once | ORAL | Status: AC
  Administered 2022-08-03: 30 mL via ORAL

## 2022-08-03 NOTE — ED Triage Notes (Signed)
Pt reports acid reflux x 1 week. Worse after eating.

## 2022-08-03 NOTE — Discharge Instructions (Signed)
We gave you Maalox/Mylanta today.  You can continue taking this at home.  Continue Carafate 4 times daily and Protonix daily for Singh the morning and empty stomach.  Please follow-up with your gastroenterologist or primary care provider if your symptoms persist despite treatment.  If your symptoms worsen, go to the emergency room.

## 2022-08-03 NOTE — ED Provider Notes (Signed)
[facility]    CSN: [id number] Arrival date & time: 08/03/22  1423      History   Chief Complaint Chief Complaint  Patient presents with   Gastroesophageal Reflux    HPI Colin Wagner is a 71 y.o. male.   Patient presents today for 1 week of regurgitation of food.  Reports whenever he eats or drinks, "it comes back up."  He denies vomiting or nausea.  No abdominal pain, fever, bodies, or chills.  No sore throat or cough.  Patient reports he is passing flatus normally and having normal bowel movements.  Reports he is taking Protonix and Carafate as prescribed.  Also reports he has hiccups.  No blood in the regurgitated food or in the stool.  Medical history significant for head and neck cancer.  He is following closely with oncology and is set to see them tomorrow to start new immunotherapy for the head and neck cancer.     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    Right shoulder pain    Testicular hypofunction     Patient Active Problem List   Diagnosis Date Noted   Goals of care, counseling/discussion 07/28/2022   Failure to thrive in adult 07/26/2022   Loss of weight 07/26/2022   Esophageal dysphagia 07/26/2022   Acute  GI bleeding 07/26/2022   Hypoglycemia 07/25/2022   Seizure-like activity ([place]) 07/13/2022   Mild protein malnutrition ([place]) 07/13/2022   Leukocytopenia 07/13/2022   Normocytic anemia 07/13/2022   Abnormal MRI of the head 07/13/2022   Encounter for antineoplastic chemotherapy 05/20/2022   Primary malignant neoplasm of right upper lobe of lung ([place]) 05/17/2022   GERD (gastroesophageal reflux disease)    IDA (iron deficiency anemia)    GI bleed 12/04/2019   Anemia    Rectal bleed 11/05/2019   Acute respiratory failure with hypoxia ([place]) 07/29/2019   Pneumonia due to COVID-19 virus 07/28/2019   AKI (acute kidney injury) ([place]) 07/27/2019   Hyperkalemia 07/27/2019   COVID-19 virus infection 07/27/2019   Urinary retention due to benign prostatic hyperplasia 03/01/2019   Attention to G-tube ([place]) 02/08/2018   UTI (urinary tract infection) 04/20/2017   UTI due to Klebsiella species 04/20/2017   Essential hypertension 04/20/2017   Chronic pancreatitis ([place]) 04/20/2017   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   Malnutrition of moderate degree 80/99/8338   Complicated UTI (urinary tract infection)    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   Rectal bleeding 09/28/2016   Hyponatremia 04/28/2015   Abdominal pain 04/27/2015  Acute pancreatitis 04/27/2015   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  [name], polyps   COLONOSCOPY  2005   Dr. [name], multiple diverticula   COLONOSCOPY  2008   Dr. [name], diverticulosis   COLONOSCOPY WITH PROPOFOL N/A 10/21/2016   Dr. [name]: Diverticulosis, two 5-7 mm polyps removed. path-tubular adenomas.  Next colonoscopy in 5 years.   COLONOSCOPY WITH PROPOFOL N/A 12/31/2021   Procedure: COLONOSCOPY WITH PROPOFOL;  Surgeon: [name], MD;  [facility];  Service: Endoscopy;  Laterality: N/A;  8:15am   ENDOBRONCHIAL ULTRASOUND Right 04/30/2022   Procedure: ENDOBRONCHIAL ULTRASOUND;  Surgeon: Maryjane Hurter, MD;  [facility];  Service: Pulmonary;  Laterality: Right;   ESOPHAGEAL BRUSHING  07/26/2022   Procedure: ESOPHAGEAL BRUSHING;  Surgeon: [name], DO;  [facility];  Service: Endoscopy;;   ESOPHAGEAL DILATION N/A 07/26/2022   Procedure: ESOPHAGEAL DILATION;  Surgeon: [name], DO;  [facility];  Service: Endoscopy;  Laterality: N/A;   ESOPHAGOGASTRODUODENOSCOPY     Multiple EGDs. 1999 EGD showed gastric ulcers, no H pylori and benign biopsies performed by Dr. [name]. 2001 gastric ulcer healed. Last EGD 2005 had gastritis.   ESOPHAGOGASTRODUODENOSCOPY (EGD) WITH PROPOFOL N/A 12/31/2021   narrowing/deformity at UES s/p baloon dilation   ESOPHAGOGASTRODUODENOSCOPY (EGD) WITH PROPOFOL N/A 07/26/2022   Procedure: ESOPHAGOGASTRODUODENOSCOPY (EGD) WITH PROPOFOL;  Surgeon: [name], DO;  [facility];  Service: Endoscopy;  Laterality: N/A;   FINE NEEDLE ASPIRATION  04/30/2022   Procedure: FINE NEEDLE ASPIRATION (FNA) LINEAR;  Surgeon: Maryjane Hurter, MD;  [facility];  Service: Pulmonary;;   IR GASTROSTOMY TUBE REMOVAL  02/08/2020   IR IMAGING GUIDED PORT INSERTION  05/26/2022   IR REPLACE G-TUBE SIMPLE WO FLUORO  03/20/2019   IR REPLACE G-TUBE SIMPLE WO FLUORO  09/21/2019   IR REPLC GASTRO/COLONIC TUBE PERCUT W/FLUORO  05/16/2018   Left  partial mandibulectomy, Scapular free flap reconstruction, selective neck dissection, tracheotomy, and resection of intraoral palate cancer. Left 03/01/2017   [facility]   LUNG BIOPSY     MOUTH SURGERY     PARTIAL NEPHRECTOMY Left 2012   POLYPECTOMY  10/21/2016   Procedure: POLYPECTOMY;  Surgeon: [name], MD;  [facility];  Service: Endoscopy;;  hepatic flexure x2   POLYPECTOMY  12/31/2021   Procedure: POLYPECTOMY INTESTINAL;  Surgeon: [name], MD;  [facility];  Service: Endoscopy;;   SURGERY OF LIP  06/2019   lip revision   TRANSURETHRAL RESECTION OF PROSTATE N/A 03/01/2019   Procedure: TRANSURETHRAL RESECTION OF THE PROSTATE (TURP)WITH CYSTOSCOPY;  Surgeon: Raynelle Bring, MD;  [facility];  Service: Urology;  Laterality: N/A;   VIDEO BRONCHOSCOPY Right 04/30/2022   Procedure: VIDEO BRONCHOSCOPY WITHOUT FLUORO;  Surgeon: Maryjane Hurter, MD;  [facility];  Service: Pulmonary;  Laterality: Right;       Home Medications    Prior to Admission medications   Medication Sig Start Date End Date Taking? Authorizing Provider  acetaminophen (TYLENOL) 500 MG tablet Take 1,000 mg by mouth every 6 (six) hours as needed for mild pain or moderate pain.    [provider]  albuterol (VENTOLIN HFA) 108 (90 Base) MCG/ACT inhaler Inhale 2 puffs into the lungs every 4 (four) hours as needed for wheezing or shortness of breath. 03/29/22   [provider]  amLODipine (NORVASC) 10 MG tablet Take 1 tablet (10 mg total) by mouth daily. 07/27/22   Roxan Hockey, MD  aspirin EC 81 MG tablet Take 1 tablet (81 mg total) by mouth daily with breakfast. 07/27/22   Denton Brick, Courage, MD  atorvastatin (LIPITOR) 40 MG tablet Take 1 tablet (40 mg total) by mouth daily. 07/27/22   Roxan Hockey, MD  cholecalciferol (VITAMIN D) 25 MCG (1000 UNIT) tablet Take 1,000 Units by mouth daily.    [provider]   clopidogrel (PLAVIX) 75 MG tablet Take 1 tablet (75 mg total) by mouth daily. 07/27/22   Roxan Hockey, MD  ferrous sulfate 325 (65 FE) MG tablet Take 1 tablet (325 mg total) by mouth daily with breakfast. 07/27/22   Denton Brick, Courage, MD  fluconazole (DIFLUCAN) 100 MG tablet Take 100 mg by mouth daily. 07/20/22   [provider]  fluticasone (FLONASE) 50 MCG/ACT nasal spray Place 2 sprays into both nostrils daily.    [provider]  GARLIC PO Take 1 tablet by mouth daily.    [provider]  HYDROcodone-acetaminophen (NORCO) 7.5-325 MG tablet Take 1 tablet by mouth every 6 (six) hours as needed for moderate pain. 06/30/22   [provider]  LEVEMIR FLEXTOUCH 100 UNIT/ML FlexTouch Pen Inject 18 Units into the skin at bedtime. 07/27/22   Roxan Hockey, MD  levETIRAcetam (KEPPRA) 500 MG tablet Take 1 tablet (500 mg total) by mouth 2 (two) times daily. 07/14/22   Eugenie Filler, MD  LORazepam (ATIVAN) 1 MG tablet Take 1 mg by mouth every 6 (six) hours as needed for anxiety or sleep. 03/31/15   [provider]  megestrol (MEGACE) 40 MG/ML suspension Take 10 mLs (400 mg total) by mouth daily. 07/27/22   Roxan Hockey, MD  metoCLOPramide (REGLAN) 10 MG tablet Take 1 tablet (10 mg total) by mouth every 6 (six) hours as needed (Hiccups.). 07/14/22 07/14/23  Eugenie Filler, MD  metoprolol succinate (TOPROL XL) 100 MG 24 hr tablet Take 1 tablet (100 mg total) by mouth daily. Take with or immediately following a meal. 07/27/22 07/27/23  Roxan Hockey, MD  nystatin cream (MYCOSTATIN) Apply to affected area 2 times daily 06/13/22   Volney American, PA-C  OVER THE COUNTER MEDICATION Take 10 mLs by mouth 2 (two) times daily. maalox    [provider]  pantoprazole (PROTONIX) 40 MG tablet Take 1 tablet (40 mg total) by mouth 2 (two) times daily. 07/27/22   Roxan Hockey, MD  prochlorperazine (COMPAZINE) 10 MG tablet TAKE 1 TABLET(10 MG) BY  MOUTH EVERY 6 HOURS AS NEEDED FOR NAUSEA OR VOMITING Patient taking differently: Take 10 mg by mouth every 6 (six) hours as needed for vomiting or nausea. 07/15/22   Heilingoetter, Cassandra L, PA-C  senna (SENOKOT) 8.6 MG TABS tablet Take 1 tablet (8.6 mg total) by mouth at bedtime as needed for mild constipation. May be needed as pain medication can  constipate. 01/12/17   [name], MD  sildenafil (VIAGRA) 100 MG tablet Take 100 mg by mouth daily as needed for erectile dysfunction.    [provider]  sucralfate (CARAFATE) 1 g tablet Take 1 tablet (1 g total) by mouth 4 (four) times daily -  with meals and at bedtime. May dissolve 1 tab and glass of water and drink as needed 07/27/22   Roxan Hockey, MD  testosterone cypionate (DEPOTESTOSTERONE CYPIONATE) 200 MG/ML injection Inject 1.5 mLs into the muscle every 14 (fourteen) days. 05/22/22   [provider]  tiZANidine (ZANAFLEX) 4 MG tablet Take 4 mg by mouth every 6 (six) hours as needed for muscle spasms. 06/30/22   [provider]  umeclidinium-vilanterol (ANORO ELLIPTA) 62.5-25 MCG/ACT AEPB Inhale 1 puff into the lungs daily. 07/27/22   Roxan Hockey, MD    Family History Family History  Problem Relation Age of Onset   Hypertension Mother    Colon cancer Neg Hx     Social History Social History   Tobacco Use   Smoking status: Former    Packs/day: 0.50    Years: 35.00    Total pack years: 17.50    Types: Cigarettes    Quit date: 03/01/2017    Years since quitting: 5.4   Smokeless tobacco: Never   Tobacco comments:    quit 02-2017  Vaping Use   Vaping Use: Never used  Substance Use Topics   Alcohol use: No    Comment: quit 2000 but relapse in 2016. no etoh since hospitalized 2016.    Drug use: No     Allergies   Trazodone   Review of Systems Review of Systems Per HPI  Physical Exam Triage Vital Signs ED Triage Vitals [08/03/22 1544]  Enc Vitals Group     BP 139/68     Pulse Rate  92     Resp 18     Temp 97.8 F (36.6 C)     Temp Source Oral     SpO2 94 %     Weight      Height      Head Circumference      Peak Flow      Pain Score 0     Pain Loc      Pain Edu?      Excl. in [place]?    No data found.  Updated Vital Signs BP 139/68 (BP Location: Right Arm)   Pulse 92   Temp 97.8 F (36.6 C) (Oral)   Resp 18   SpO2 94%   Visual Acuity Right Eye Distance:   Left Eye Distance:   Bilateral Distance:    Right Eye Near:   Left Eye Near:    Bilateral Near:     Physical Exam Vitals and nursing note reviewed.  Constitutional:      General: He is not in acute distress.    Appearance: Normal appearance. He is not toxic-appearing.  HENT:     Head: Normocephalic and atraumatic.     Comments: Left sided facial droop and swelling; baseline per patient Eyes:     General: No scleral icterus.    Extraocular Movements: Extraocular movements intact.     Pupils: Pupils are equal, round, and reactive to light.  Pulmonary:     Effort: Pulmonary effort is normal. No respiratory distress.     Breath sounds: No wheezing, rhonchi or rales.  Abdominal:     General: Abdomen is flat. Bowel sounds are normal.  Palpations: Abdomen is soft.     Tenderness: There is no abdominal tenderness. There is no right CVA tenderness, left CVA tenderness or guarding. Negative signs include Murphy's sign and McBurney's sign.  Skin:    General: Skin is warm and dry.     Capillary Refill: Capillary refill takes less than 2 seconds.     Coloration: Skin is not jaundiced or pale.     Findings: No erythema.  Neurological:     Mental Status: He is alert and oriented to person, place, and time.  Psychiatric:        Behavior: Behavior is cooperative.      UC Treatments / Results  Labs (all labs ordered are listed, but only abnormal results are displayed) Labs Reviewed - No data to display  EKG   Radiology No results found.  Procedures Procedures (including critical care  time)  Medications Ordered in UC Medications  alum & mag hydroxide-simeth (MAALOX/MYLANTA) 200-200-20 MG/5ML suspension 30 mL (30 mLs Oral Given 08/03/22 1611)    Initial Impression / Assessment and Plan / UC Course  I have reviewed the triage vital signs and the nursing notes.  Pertinent labs & imaging results that were available during my care of the patient were reviewed by me and considered in my medical decision making (see chart for details).   Patient is well-appearing, normotensive, afebrile, not tachycardic, not tachypneic, oxygenating well on room air.    Regurgitation of food No red flags in history or on exam today Suspect secondary to ongoing esophagitis GI cocktail given in urgent care today which patient reports helped with indigestion, burning and abdomen, and regurgitation sensation Continue Maalox/Mylanta at home, continue previously prescribed medications pantoprazole and still Carafate 4 times daily Recommended close follow-up with gastroenterologist or primary care provider if symptoms persist despite treatment Strict ER precautions discussed with patient  The patient was given the opportunity to ask questions.  All questions answered to their satisfaction.  The patient is in agreement to this plan.    Final Clinical Impressions(s) / UC Diagnoses   Final diagnoses:  Regurgitation of food     Discharge Instructions      We gave you Maalox/Mylanta today.  You can continue taking this at home.  Continue Carafate 4 times daily and Protonix daily for [place] the morning and empty stomach.  Please follow-up with your gastroenterologist or primary care provider if your symptoms persist despite treatment.  If your symptoms worsen, go to the emergency room.     ED Prescriptions   None    PDMP not reviewed this encounter.   Eulogio Bear, NP 08/03/22 [phone]

## 2022-08-04 ENCOUNTER — Inpatient Hospital Stay: Payer: Medicare Other

## 2022-08-04 ENCOUNTER — Other Ambulatory Visit: Payer: Medicaid Other

## 2022-08-04 ENCOUNTER — Inpatient Hospital Stay: Payer: Medicare Other | Admitting: Dietician

## 2022-08-04 NOTE — Progress Notes (Signed)
Pharmacist Chemotherapy Monitoring - Initial Assessment    Anticipated start date: 12/20/ 23  The following has been reviewed per standard work regarding the patient's treatment regimen: The patient's diagnosis, treatment plan and drug doses, and organ/hematologic function Lab orders and baseline tests specific to treatment regimen  The treatment plan start date, drug sequencing, and pre-medications Prior authorization status  Patient's documented medication list, including drug-drug interaction screen and prescriptions for anti-emetics and supportive care specific to the treatment regimen The drug concentrations, fluid compatibility, administration routes, and timing of the medications to be used The patient's access for treatment and lifetime cumulative dose history, if applicable  The patient's medication allergies and previous infusion related reactions, if applicable   Changes made to treatment plan:  N/A  Follow up needed:  N/A   Larene Beach, RPH, 08/04/2022  10:41 AM

## 2022-08-06 ENCOUNTER — Ambulatory Visit: Payer: Self-pay | Admitting: Radiation Oncology

## 2022-08-06 ENCOUNTER — Telehealth: Payer: Self-pay | Admitting: Internal Medicine

## 2022-08-06 NOTE — Telephone Encounter (Signed)
Called patient regarding upcoming December, January and February appointments. Patient is notified.

## 2022-08-09 ENCOUNTER — Encounter (HOSPITAL_COMMUNITY): Payer: Self-pay

## 2022-08-09 ENCOUNTER — Telehealth: Payer: Self-pay

## 2022-08-09 ENCOUNTER — Other Ambulatory Visit: Payer: Self-pay

## 2022-08-09 ENCOUNTER — Emergency Department (HOSPITAL_COMMUNITY)
Admission: EM | Admit: 2022-08-09 | Discharge: 2022-08-10 | Disposition: A | Discharge status: Home / Self Care | Payer: Medicare Other | Attending: Emergency Medicine | Admitting: Emergency Medicine

## 2022-08-09 DIAGNOSIS — R569 Unspecified convulsions: Secondary | ICD-10-CM | POA: Insufficient documentation

## 2022-08-09 DIAGNOSIS — Z85118 Personal history of other malignant neoplasm of bronchus and lung: Secondary | ICD-10-CM | POA: Insufficient documentation

## 2022-08-09 DIAGNOSIS — J449 Chronic obstructive pulmonary disease, unspecified: Secondary | ICD-10-CM | POA: Diagnosis not present

## 2022-08-09 DIAGNOSIS — I10 Essential (primary) hypertension: Secondary | ICD-10-CM | POA: Insufficient documentation

## 2022-08-09 DIAGNOSIS — Z7982 Long term (current) use of aspirin: Secondary | ICD-10-CM | POA: Insufficient documentation

## 2022-08-09 MED ORDER — SODIUM CHLORIDE 0.9 % IV BOLUS
500.0000 mL | Freq: Once | INTRAVENOUS | Status: AC
  Administered 2022-08-10: 500 mL via INTRAVENOUS

## 2022-08-09 NOTE — ED Provider Notes (Signed)
[facility] EMERGENCY DEPARTMENT Provider Note   CSN: [id number] Arrival date & time: 08/09/22  2304     History  Chief Complaint  Patient presents with   Seizures    Colin Wagner is a 71 y.o. male.  Patient is a 71 year old male with past medical history of lung cancer status post chemo and radiation awaiting initiation of immunotherapy, hypertension, GERD, COPD, chronic pancreatitis.  Patient presenting today for evaluation of seizure-like activity.  According to the fianc at bedside, patient had 4 episodes this evening of falling back in bed and shaking all over.  Each episode lasted approximately 30 seconds.  He would not respond to her during this episode, but had no confusion afterward.  Patient has been in his normal state of health and these episodes came on suddenly this evening.  Patient had similar episodes in November and was worked up for this.  He underwent CT scans, MRI, EEG, all of which were unremarkable.  He was started on Keppra and reports being compliant with this..  The history is provided by the patient, the EMS personnel and a relative (Patient's fianc).       Home Medications Prior to Admission medications   Medication Sig Start Date End Date Taking? Authorizing Provider  acetaminophen (TYLENOL) 500 MG tablet Take 1,000 mg by mouth every 6 (six) hours as needed for mild pain or moderate pain.    [provider]  albuterol (VENTOLIN HFA) 108 (90 Base) MCG/ACT inhaler Inhale 2 puffs into the lungs every 4 (four) hours as needed for wheezing or shortness of breath. 03/29/22   [provider]  amLODipine (NORVASC) 10 MG tablet Take 1 tablet (10 mg total) by mouth daily. 07/27/22   Shon Hale, MD  aspirin EC 81 MG tablet Take 1 tablet (81 mg total) by mouth daily with breakfast. 07/27/22   Mariea Clonts, Courage, MD  atorvastatin (LIPITOR) 40 MG tablet Take 1 tablet (40 mg total) by mouth daily. 07/27/22   Shon Hale, MD  cholecalciferol  (VITAMIN D) 25 MCG (1000 UNIT) tablet Take 1,000 Units by mouth daily.    [provider]  clopidogrel (PLAVIX) 75 MG tablet Take 1 tablet (75 mg total) by mouth daily. 07/27/22   Shon Hale, MD  ferrous sulfate 325 (65 FE) MG tablet Take 1 tablet (325 mg total) by mouth daily with breakfast. 07/27/22   Mariea Clonts, Courage, MD  fluconazole (DIFLUCAN) 100 MG tablet Take 100 mg by mouth daily. 07/20/22   [provider]  fluticasone (FLONASE) 50 MCG/ACT nasal spray Place 2 sprays into both nostrils daily.    [provider]  GARLIC PO Take 1 tablet by mouth daily.    [provider]  HYDROcodone-acetaminophen (NORCO) 7.5-325 MG tablet Take 1 tablet by mouth every 6 (six) hours as needed for moderate pain. 06/30/22   [provider]  LEVEMIR FLEXTOUCH 100 UNIT/ML FlexTouch Pen Inject 18 Units into the skin at bedtime. 07/27/22   Shon Hale, MD  levETIRAcetam (KEPPRA) 500 MG tablet Take 1 tablet (500 mg total) by mouth 2 (two) times daily. 07/14/22   Rodolph Bong, MD  LORazepam (ATIVAN) 1 MG tablet Take 1 mg by mouth every 6 (six) hours as needed for anxiety or sleep. 03/31/15   [provider]  megestrol (MEGACE) 40 MG/ML suspension Take 10 mLs (400 mg total) by mouth daily. 07/27/22   Shon Hale, MD  metoCLOPramide (REGLAN) 10 MG tablet Take 1 tablet (10 mg total) by mouth every  6 (six) hours as needed (Hiccups.). 07/14/22 07/14/23  Rodolph Bong, MD  metoprolol succinate (TOPROL XL) 100 MG 24 hr tablet Take 1 tablet (100 mg total) by mouth daily. Take with or immediately following a meal. 07/27/22 07/27/23  Shon Hale, MD  nystatin cream (MYCOSTATIN) Apply to affected area 2 times daily 06/13/22   Particia Nearing, PA-C  OVER THE COUNTER MEDICATION Take 10 mLs by mouth 2 (two) times daily. maalox    [provider]  pantoprazole (PROTONIX) 40 MG tablet Take 1 tablet (40 mg total) by mouth 2 (two) times daily.  07/27/22   Shon Hale, MD  prochlorperazine (COMPAZINE) 10 MG tablet TAKE 1 TABLET(10 MG) BY MOUTH EVERY 6 HOURS AS NEEDED FOR NAUSEA OR VOMITING Patient taking differently: Take 10 mg by mouth every 6 (six) hours as needed for vomiting or nausea. 07/15/22   Heilingoetter, Cassandra L, PA-C  senna (SENOKOT) 8.6 MG TABS tablet Take 1 tablet (8.6 mg total) by mouth at bedtime as needed for mild constipation. May be needed as pain medication can constipate. 01/12/17   Lonie Peak, MD  sildenafil (VIAGRA) 100 MG tablet Take 100 mg by mouth daily as needed for erectile dysfunction.    [provider]  sucralfate (CARAFATE) 1 g tablet Take 1 tablet (1 g total) by mouth 4 (four) times daily -  with meals and at bedtime. May dissolve 1 tab and glass of water and drink as needed 07/27/22   Shon Hale, MD  testosterone cypionate (DEPOTESTOSTERONE CYPIONATE) 200 MG/ML injection Inject 1.5 mLs into the muscle every 14 (fourteen) days. 05/22/22   [provider]  tiZANidine (ZANAFLEX) 4 MG tablet Take 4 mg by mouth every 6 (six) hours as needed for muscle spasms. 06/30/22   [provider]  umeclidinium-vilanterol (ANORO ELLIPTA) 62.5-25 MCG/ACT AEPB Inhale 1 puff into the lungs daily. 07/27/22   Shon Hale, MD      Allergies    Trazodone    Review of Systems   Review of Systems  All other systems reviewed and are negative.   Physical Exam Updated Vital Signs BP 129/74   Pulse 64   Temp 97.8 F (36.6 C)   Resp 19   Ht 5\' 11"  (1.803 m)   Wt 83 kg   SpO2 97%   BMI 25.52 kg/m  Physical Exam Vitals and nursing note reviewed.  Constitutional:      General: He is not in acute distress.    Appearance: He is well-developed. He is not diaphoretic.  HENT:     Head: Normocephalic and atraumatic.  Cardiovascular:     Rate and Rhythm: Normal rate and regular rhythm.     Heart sounds: No murmur heard.    No friction rub.  Pulmonary:     Effort: Pulmonary  effort is normal. No respiratory distress.     Breath sounds: Normal breath sounds. No wheezing or rales.  Abdominal:     General: Bowel sounds are normal. There is no distension.     Palpations: Abdomen is soft.     Tenderness: There is no abdominal tenderness.  Musculoskeletal:        General: Normal range of motion.     Cervical back: Normal range of motion and neck supple.  Skin:    General: Skin is warm and dry.  Neurological:     Mental Status: He is alert and oriented to person, place, and time.     Coordination: Coordination normal.  Comments: Patient has a left-sided facial droop which appears chronic.  Otherwise he moves all 4 extremities, strength is intact, and cranial nerves are otherwise intact.     ED Results / Procedures / Treatments   Labs (all labs ordered are listed, but only abnormal results are displayed) Labs Reviewed  COMPREHENSIVE METABOLIC PANEL  CBC WITH DIFFERENTIAL/PLATELET    EKG EKG Interpretation  Date/Time:  Tuesday August 10 2022 00:27:54 EST Ventricular Rate:  79 PR Interval:  137 QRS Duration: 85 QT Interval:  380 QTC Calculation: 436 R Axis:   81 Text Interpretation: Sinus rhythm Right atrial enlargement No significant change since 07/25/2022 Confirmed by Geoffery Lyons ([id number]) on 08/10/2022 1:15:26 AM  Radiology No results found.  Procedures Procedures    Medications Ordered in ED Medications  sodium chloride 0.9 % bolus 500 mL (has no administration in time range)    ED Course/ Medical Decision Making/ A&P  Patient is a 71 year old male brought by EMS for evaluation of seizure-like activity.  He had similar episode 1 month ago and was started on Keppra.  His fiance reports 4 episodes of shaking this evening, each lasting approximately 30 seconds.  He arrives here awake, alert, and neurologically intact.  Vital signs are stable with no fever or hypoxia.  Workup initiated including CBC and metabolic panel.  CBC reveals a  mild anemia with hemoglobin of 10.1, but consistent with baseline.  He does have an elevation of BUN and a mild bump in his creatinine consistent with perhaps mild dehydration.  Patient was given 500 cc of saline by EMS and an additional 500 cc here in the ER.  He has been observed for several hours and has had no additional seizure activity.  Upon my reexamination, he is back to normal and has no complaints.  Upon reviewing his chart from previous visit, he had full seizure workup performed and is awaiting consultation with neurology as an outpatient.  I feel as though patient can be discharged safely with as needed return and continuation of his Keppra.  Final Clinical Impression(s) / ED Diagnoses Final diagnoses:  None    Rx / DC Orders ED Discharge Orders     None         Geoffery Lyons, MD 08/10/22 [phone]

## 2022-08-09 NOTE — Telephone Encounter (Signed)
This nurse received a message from Dr. Newt Minion requesting a call back from one of the providers about this patient stating they saw him in the office for an acute visit and noticed an extreme weight loss and would like to know what the provider may think is causing his weight loss. Also provided email address of [email or web address] if email is preferred.  No further concerns noted.

## 2022-08-09 NOTE — Progress Notes (Unsigned)
[facility] OFFICE PROGRESS NOTE  [name], MD [place] [address]  DIAGNOSIS: Stage IIIc (T4, N3, M0) non-small cell lung cancer, squamous cell carcinoma presented with large right upper lobe lung mass in addition to mediastinal and bilateral hilar lymphadenopathy diagnosed in September 2023.   PRIOR THERAPY:  A course of concurrent chemoradiation with weekly carboplatin for AUC of 2 and paclitaxel 45 Mg/M2.  Last dose on 07/05/22. Status post 6 cycles.   CURRENT THERAPY: Consolidation immunotherapy with Imfinzi 1500 mg IV every 4 weeks.  First dose on 08/11/22.   INTERVAL HISTORY: Colin Wagner 71 y.o. male returns to the clinic today for a follow-up visit accompanied by his girlfriend.  The patient was last seen by me Dr. Julien Nordmann on 07/28/2022.  At that point time, we discussed the next steps in the patient's care for his lung cancer.  Of note the patient also has a history head and neck cancer.  The patient had a challenging time with his course of concurrent chemoradiation secondary to radiation-induced esophagitis.  He was in the hospital following treatment for the esophagitis and had an EGD on 07/26/2022 which showed moderate least severe erosive esophagitis with bleeding for which she is on Carafate and Protonix.  His Plavix is on hold.  He follows with Dr. [name] from gastroenterology.  Patient is scheduled to follow-up with him on 08/26/2022.  They are going to reassess him and see if they need to arrange for repeat EGD.   In the interval since last being seen, the patient was seen twice at urgent cares and family practice for reflux and regurgitating food.  The patient's weight has continued to drown trend significantly.  The patient denies any odynophagia. He is taking Megace.  His girlfriend also got him packets of NeutraCaine.  He is scheduled to see member the nutritionist team today.  He is lost over 10 pounds since last being seen.  They would  like to wait until they hear from GI before considering possible PEG tube.  Reportedly his appetite has improved over the last few days.  They would be interested in extra IV fluids today.  He is also supposed to see neurologist in 08/24/2022 for possible seizures.  He was recently seen in the emergency room earlier this week again for seizure-like activity at home.  Otherwise he is feeling fine today.  He denies any fever, chills, or night sweats. He reports his breathing is stable. Denies any hemoptysis or chest pain.  Denies any nausea. Denies any diarrhea or constipation. Denies any headache or visual changes.  He is supposed to undergo his first cycle of treatment last week but it was canceled due to not feeling well.  He is here today for evaluation and repeat blood work before undergoing cycle #1.   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    Right shoulder pain    Testicular hypofunction     ALLERGIES:  is allergic to trazodone.  MEDICATIONS:  Current Outpatient Medications  Medication Sig Dispense Refill   acetaminophen (TYLENOL) 500 MG tablet Take 1,000 mg by mouth every 6 (six) hours as needed for mild pain or moderate pain.      albuterol (VENTOLIN HFA) 108 (90 Base) MCG/ACT inhaler Inhale 2 puffs into the lungs every 4 (four) hours as needed for wheezing or shortness of breath.     amLODipine (NORVASC) 10 MG tablet Take 1 tablet (10 mg total) by mouth daily. 30 tablet 5   aspirin EC 81 MG tablet Take 1 tablet (81 mg total) by mouth daily with breakfast. 30 tablet 11   atorvastatin (LIPITOR) 40 MG tablet Take 1 tablet (40 mg total) by mouth daily. 30 tablet 5   cholecalciferol (VITAMIN D) 25 MCG (1000 UNIT) tablet Take 1,000 Units by mouth daily.     clopidogrel (PLAVIX) 75 MG tablet Take 1 tablet (75 mg total) by mouth daily. 30 tablet 5   ferrous sulfate 325 (65 FE) MG tablet Take 1 tablet (325 mg total) by mouth daily with breakfast. 30 tablet 3   fluconazole (DIFLUCAN) 100 MG tablet Take 100 mg by mouth daily.     fluticasone (FLONASE) 50 MCG/ACT nasal spray Place 2 sprays into both nostrils daily.     GARLIC PO Take 1 tablet by mouth daily.     HYDROcodone-acetaminophen (NORCO) 7.5-325 MG tablet Take 1 tablet by mouth every 6 (six) hours as needed for moderate pain.     LEVEMIR FLEXTOUCH 100 UNIT/ML FlexTouch Pen Inject 18 Units into the skin at bedtime. 15 mL 11   levETIRAcetam (KEPPRA) 500 MG tablet Take 1 tablet (500 mg total) by mouth 2 (two) times daily. 60 tablet 1   LORazepam (ATIVAN) 1 MG tablet Take 1 mg by mouth every 6 (six) hours as needed for anxiety or sleep.  5   megestrol (MEGACE) 40 MG/ML suspension Take 10 mLs (400 mg total) by mouth daily. 480 mL 3   metoCLOPramide (REGLAN) 10 MG tablet Take 1 tablet (10 mg total) by mouth every 6 (six) hours as needed (Hiccups.). 20 tablet 1   metoprolol succinate (TOPROL XL) 100 MG 24 hr tablet Take 1 tablet (100 mg total) by mouth daily. Take with or immediately following a meal. 30 tablet 5   nystatin cream (MYCOSTATIN) Apply to affected area 2 times daily 80 g 0   OVER THE COUNTER MEDICATION Take 10 mLs by mouth 2 (two) times daily. maalox     pantoprazole  (PROTONIX) 40 MG tablet Take 1 tablet (40 mg total) by mouth 2 (two) times daily. 60 tablet 3   prochlorperazine (COMPAZINE) 10 MG tablet TAKE 1 TABLET(10 MG) BY MOUTH EVERY 6 HOURS AS NEEDED FOR NAUSEA OR VOMITING (Patient taking differently: Take 10 mg by mouth every 6 (six) hours as needed for vomiting or nausea.) 30 tablet 2   senna (SENOKOT) 8.6 MG TABS tablet Take 1 tablet (8.6 mg total) by mouth at bedtime as needed for mild constipation. May be needed as pain medication can constipate.  120 each 1   sildenafil (VIAGRA) 100 MG tablet Take 100 mg by mouth daily as needed for erectile dysfunction.     sucralfate (CARAFATE) 1 g tablet Take 1 tablet (1 g total) by mouth 4 (four) times daily -  with meals and at bedtime. May dissolve 1 tab and glass of water and drink as needed 120 tablet 1   testosterone cypionate (DEPOTESTOSTERONE CYPIONATE) 200 MG/ML injection Inject 1.5 mLs into the muscle every 14 (fourteen) days.     tiZANidine (ZANAFLEX) 4 MG tablet Take 4 mg by mouth every 6 (six) hours as needed for muscle spasms.     umeclidinium-vilanterol (ANORO ELLIPTA) 62.5-25 MCG/ACT AEPB Inhale 1 puff into the lungs daily. 60 each 5   No current facility-administered medications for this visit.    SURGICAL HISTORY:  Past Surgical History:  Procedure Laterality Date   BALLOON DILATION  12/31/2021   Procedure: BALLOON DILATION;  Surgeon: [name], MD;  [facility];  Service: Endoscopy;;   BIOPSY  04/30/2022   Procedure: BIOPSY;  Surgeon: Maryjane Hurter, MD;  [facility]: [facility];  Service: Pulmonary;;   CAROTID STENT  06/2018   COLONOSCOPY  2003   Dr. [name], polyps   COLONOSCOPY  2005   Dr. [name], multiple diverticula   COLONOSCOPY  2008   Dr. [name], diverticulosis   COLONOSCOPY WITH PROPOFOL N/A 10/21/2016   Dr. [name]: Diverticulosis, two 5-7 mm polyps removed. path-tubular adenomas.  Next colonoscopy in 5 years.   COLONOSCOPY WITH PROPOFOL N/A  12/31/2021   Procedure: COLONOSCOPY WITH PROPOFOL;  Surgeon: [name], MD;  [facility];  Service: Endoscopy;  Laterality: N/A;  8:15am   ENDOBRONCHIAL ULTRASOUND Right 04/30/2022   Procedure: ENDOBRONCHIAL ULTRASOUND;  Surgeon: Maryjane Hurter, MD;  [facility];  Service: Pulmonary;  Laterality: Right;   ESOPHAGEAL BRUSHING  07/26/2022   Procedure: ESOPHAGEAL BRUSHING;  Surgeon: [name], DO;  [facility];  Service: Endoscopy;;   ESOPHAGEAL DILATION N/A 07/26/2022   Procedure: ESOPHAGEAL DILATION;  Surgeon: [name], DO;  [facility];  Service: Endoscopy;  Laterality: N/A;   ESOPHAGOGASTRODUODENOSCOPY     Multiple EGDs. 1999 EGD showed gastric ulcers, no H pylori and benign biopsies performed by Dr. [name]. 2001 gastric ulcer healed. Last EGD 2005 had gastritis.   ESOPHAGOGASTRODUODENOSCOPY (EGD) WITH PROPOFOL N/A 12/31/2021   narrowing/deformity at UES s/p baloon dilation   ESOPHAGOGASTRODUODENOSCOPY (EGD) WITH PROPOFOL N/A 07/26/2022   Procedure: ESOPHAGOGASTRODUODENOSCOPY (EGD) WITH PROPOFOL;  Surgeon: [name], DO;  [facility];  Service: Endoscopy;  Laterality: N/A;   FINE NEEDLE ASPIRATION  04/30/2022   Procedure: FINE NEEDLE ASPIRATION (FNA) LINEAR;  Surgeon: Maryjane Hurter, MD;  [facility];  Service: Pulmonary;;   IR GASTROSTOMY TUBE REMOVAL  02/08/2020   IR IMAGING GUIDED PORT INSERTION  05/26/2022   IR REPLACE G-TUBE SIMPLE WO FLUORO  03/20/2019   IR REPLACE G-TUBE SIMPLE WO FLUORO  09/21/2019   IR REPLC GASTRO/COLONIC TUBE PERCUT W/FLUORO  05/16/2018   Left partial mandibulectomy, Scapular free flap reconstruction, selective neck dissection, tracheotomy, and resection of intraoral palate cancer. Left 03/01/2017   [facility]   LUNG BIOPSY     MOUTH SURGERY     PARTIAL NEPHRECTOMY Left 2012   POLYPECTOMY  10/21/2016   Procedure: POLYPECTOMY;   Surgeon: [name], MD;  [facility];  Service: Endoscopy;;  hepatic flexure x2   POLYPECTOMY  12/31/2021   Procedure: POLYPECTOMY INTESTINAL;  Surgeon: [name], MD;  [facility];  Service: Endoscopy;;   SURGERY OF LIP  06/2019   lip revision   TRANSURETHRAL RESECTION OF PROSTATE N/A 03/01/2019   Procedure: TRANSURETHRAL RESECTION OF THE PROSTATE (TURP)WITH CYSTOSCOPY;  Surgeon: Raynelle Bring, MD;  [facility];  Service: Urology;  Laterality: N/A;   VIDEO BRONCHOSCOPY Right 04/30/2022   Procedure: VIDEO BRONCHOSCOPY WITHOUT FLUORO;  Surgeon: Maryjane Hurter, MD;  [facility];  Service: Pulmonary;  Laterality: Right;    REVIEW OF SYSTEMS:   Review of Systems  Constitutional: Positive for weight loss and appetite change.  Negative for chills, fatigue, and fever.  HENT: Negative for mouth sores, nosebleeds, sore throat and trouble swallowing.   Eyes: Negative for eye problems and icterus.  Respiratory: Positive for stable dyspnea on exertion and cough.  Negative for hemoptysis and wheezing.   Cardiovascular: Negative for chest pain and leg swelling.  Gastrointestinal: Negative for abdominal pain, constipation, diarrhea, nausea and vomiting.  Genitourinary: Negative for bladder incontinence, difficulty urinating, dysuria, frequency and hematuria.   Musculoskeletal: Negative for back pain, gait problem, neck pain and neck stiffness.  Skin: Negative for itching and rash.  Neurological: Negative for dizziness, extremity weakness, gait problem, headaches, light-headedness and seizures.  Hematological: Negative for adenopathy. Does not bruise/bleed easily.  Psychiatric/Behavioral: Negative for confusion, depression and sleep disturbance. The patient is not nervous/anxious.     PHYSICAL EXAMINATION:  Blood pressure 115/60, pulse 87, temperature 98.3 F (36.8 C), temperature source Oral, resp. rate 16, weight 153 lb 1.6 oz (69.4 kg), SpO2 97  %.  ECOG PERFORMANCE STATUS: 1  Physical Exam  Constitutional: Oriented to person, place, and time and well-developed, well-nourished, and in no distress. No distress.  HENT:  Head: Normocephalic and atraumatic. Left sided swelling and drooping- s/p radiation changes hx head/neck cancer  Mouth/Throat: Oropharynx is clear and moist. No oropharyngeal exudate.  Eyes: Conjunctivae are normal. Right eye exhibits no discharge. Left eye exhibits no discharge. No scleral icterus.  Neck: Normal range of motion. Neck supple.  Cardiovascular: Normal rate, regular rhythm, quiet breath sounds bilaterally and intact distal pulses.   Pulmonary/Chest: Effort normal and breath sounds normal. No respiratory distress. No wheezes. No rales.  Abdominal: Soft. Bowel sounds are normal. Exhibits no distension and no mass. There is no tenderness.  Musculoskeletal: Normal range of motion. Exhibits no edema.  Lymphadenopathy:    No cervical adenopathy.  Neurological: Alert and oriented to person, place, and time. Exhibits normal muscle tone. Gait normal. Coordination normal.  Skin: Skin is warm and dry. No rash noted. Not diaphoretic. No erythema. No pallor.  Psychiatric: Mood, memory and judgment normal.  Vitals reviewed.Marland Kitchen  LABORATORY DATA: Lab Results  Component Value Date   WBC 8.0 08/11/2022   HGB 10.5 (L) 08/11/2022   HCT 32.5 (L) 08/11/2022   MCV 83.5 08/11/2022   PLT 390 08/11/2022      Chemistry      Component Value Date/Time   NA 141 08/11/2022 0819   K 4.8 08/11/2022 0819   CL 104 08/11/2022 0819   CO2 28 08/11/2022 0819   BUN 52 (H) 08/11/2022 0819   CREATININE 1.96 (H) 08/11/2022 0819      Component Value Date/Time   CALCIUM 10.3 08/11/2022 0819   ALKPHOS 79 08/11/2022 0819   AST 16 08/11/2022 0819   ALT 13 08/11/2022 0819   BILITOT 0.6 08/11/2022  9937       RADIOGRAPHIC STUDIES:  CT Head Wo Contrast  Result Date: 08/10/2022 CLINICAL DATA:  Mental status change. EXAM: CT  HEAD WITHOUT CONTRAST TECHNIQUE: Contiguous axial images were obtained from the base of the skull through the vertex without intravenous contrast. RADIATION DOSE REDUCTION: This exam was performed according to the departmental dose-optimization program which includes automated exposure control, adjustment of the mA and/or kV according to patient size and/or use of iterative reconstruction technique. COMPARISON:  Head CT 07/25/2022.  MRI brain 07/13/2022. FINDINGS: Brain: No evidence of acute infarction, hemorrhage, hydrocephalus, extra-axial collection or mass lesion/mass effect. Vascular: No hyperdense vessel or unexpected calcification. Skull: Normal. Negative for fracture or focal lesion. Sinuses/Orbits: Left mastoid effusion is unchanged. Paranasal sinuses are clear. Orbits are within normal limits. Other: None. IMPRESSION: No acute intracranial abnormality. Stable left mastoid effusion. Electronically Signed   By: Ronney Asters M.D.   On: 08/10/2022 00:27   DG Chest Port 1 View  Result Date: 07/25/2022 CLINICAL DATA:  Low blood sugar last night. EXAM: PORTABLE CHEST 1 VIEW COMPARISON:  July 12 2022, chest CT July 10, 2022 FINDINGS: The heart size and mediastinal contours are stable. Stable masslike opacity is identified in the medial right upper lobe/right paratracheal region unchanged. Left central venous lines tendons at distal tip in the superior vena cava unchanged. Both lungs are otherwise clear. The visualized skeletal structures are unremarkable. IMPRESSION: Stable masslike opacity is identified in the medial right upper lobe/right paratracheal region unchanged. Lungs are otherwise clear. Electronically Signed   By: Abelardo Diesel M.D.   On: 07/25/2022 08:57   CT ANGIO HEAD NECK W WO CM (CODE STROKE)  Result Date: 07/25/2022 CLINICAL DATA:  Right-sided weakness with facial droop. Stroke suspected EXAM: CT ANGIOGRAPHY HEAD AND NECK TECHNIQUE: Multidetector CT imaging of the head and neck  was performed using the standard protocol during bolus administration of intravenous contrast. Multiplanar CT image reconstructions and MIPs were obtained to evaluate the vascular anatomy. Carotid stenosis measurements (when applicable) are obtained utilizing NASCET criteria, using the distal internal carotid diameter as the denominator. RADIATION DOSE REDUCTION: This exam was performed according to the departmental dose-optimization program which includes automated exposure control, adjustment of the mA and/or kV according to patient size and/or use of iterative reconstruction technique. CONTRAST:  56mL OMNIPAQUE IOHEXOL 350 MG/ML SOLN COMPARISON:  Head CT from today.  Intracranial MRA 07/12/2022 FINDINGS: CTA NECK FINDINGS Aortic arch: Atherosclerosis with irregular plaque. Two vessel branching. Right carotid system: Atheromatous plaque with stent spanning the distal common carotid to proximal ICA. No in stent stenosis. Left carotid system: Atheromatous wall thickening diffusely with intermittent calcification. No stenosis or ulceration. Vertebral arteries: Proximal subclavian atherosclerosis with advanced low-density plaque at the left vertebral origin. The left vertebral artery is nonenhancing essentially throughout its course. The right vertebral artery is patent with mild atheromatous narrowing at its origin. Distal right vertebral fenestration at the level of C1. Skeleton: Left mandibular fracture/osteotomy fixation without bony bridging but also no sign of mandibular hardware loosening. Generalized cervical spine degeneration. Other neck: Postoperative left lateral neck and face with flap no aggressive finding. Left mastoid and middle ear opacification, chronic and possibly from prior radiotherapy. Upper chest: Known right upper lobe mass. Review of the MIP images confirms the above findings CTA HEAD FINDINGS Anterior circulation: Atheromatous plaque along the carotid siphons. Moderate diffuse atheromatous  irregularity of MCA branches. No branch occlusion or aneurysm. Posterior circulation: Diseased left vertebral artery. The basilar is primarily  filled from the right vertebral artery. Proximal basilar fenestration. Atheromatous irregularity of the basilar which is diminutive due to fetal type bilateral PCA flow. Moderate atheromatous irregularity of the posterior cerebral arteries. Venous sinuses: Negative Anatomic variants: As above Review of the MIP images confirms the above findings IMPRESSION: 1. No emergent finding. 2. 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 Stented right cervical carotid which is widely patent. 4. Moderate generalized atheromatous irregularity of intracranial vessels. Electronically Signed   By: Jorje Guild M.D.   On: 07/25/2022 08:06   CT HEAD CODE STROKE WO CONTRAST  Result Date: 07/25/2022 CLINICAL DATA:  Code stroke.  Right-sided weakness and facial droop EXAM: CT HEAD WITHOUT CONTRAST TECHNIQUE: Contiguous axial images were obtained from the base of the skull through the vertex without intravenous contrast. RADIATION DOSE REDUCTION: This exam was performed according to the departmental dose-optimization program which includes automated exposure control, adjustment of the mA and/or kV according to patient size and/or use of iterative reconstruction technique. COMPARISON:  07/12/2022 FINDINGS: Brain: No evidence of acut generalized cerebral volume loss e infarction, hemorrhage, hydrocephalus, extra-axial collection or mass lesion/mass effect. Vascular: No hyperdense vessel or unexpected calcification. Skull: Normal. Negative for fracture or focal lesion. Sinuses/Orbits: No acute finding. Other: These results were by epic chat at the time of interpretation on 07/25/2022 at 7:46 am to provider ANKIT NANAVATI. ASPECTS [facility] Stroke Program Early CT Score) - Ganglionic level infarction (caudate, lentiform nuclei,  internal capsule, insula, M1-M3 cortex): 7 - Supraganglionic infarction (M4-M6 cortex): 3 Total score (0-10 with 10 being normal): 10 IMPRESSION: 1. Stable head CT.  No acute finding. 2. ASPECTS is 10. Electronically Signed   By: Jorje Guild M.D.   On: 07/25/2022 07:46   MR ANGIO HEAD WO CONTRAST  Result Date: 07/14/2022 CLINICAL DATA:  MRA recommended due to bulbous appearance of the proximal basilar artery EXAM: MRA HEAD WITHOUT CONTRAST TECHNIQUE: Angiographic images of the Circle of Willis were acquired using MRA technique without intravenous contrast. COMPARISON:  No prior MRA, correlation is made with MRI head 07/13/2022 FINDINGS: Evaluation is somewhat limited by susceptibility artifact affecting the left suboccipital region. No restricted diffusion to suggest acute or subacute infarct. Anterior circulation: Both internal carotid arteries are patent to the termini, without significant stenosis. A1 segments patent. Normal anterior communicating artery. Anterior cerebral arteries are patent to their distal aspects. No M1 stenosis or occlusion. Normal MCA bifurcations. Distal MCA branches perfused and symmetric. Posterior circulation: No signal is seen in the proximal left vertebral artery, with some signal seen in what may be a diminutive distal left V4 (series 7, image 56), which may reflect minimal retrograde flow. The right vertebral artery is dominant and patent the vertebrobasilar junction without stenosis. Basilar patent to its distal aspect. Superior cerebellar arteries patent bilaterally. Fetal origin of the right posterior cerebral artery from the right posterior communicating artery, with mild stenosis at the posterior communicating artery origin (series 7, image 122 and near the junction with P2 (series 7, image 129). Patent and diminutive left P1, with near fetal origin of the left PCA from a prominent left posterior communicating artery. There is also likely mild stenosis at the origin of  the left posterior communicating artery (series 7, image 123). Multifocal mild stenosis in the bilateral PCAs, which are otherwise patent to their distal aspects. Anatomic variants: Fetal origin of the right PCA and near fetal origin of the left PCA IMPRESSION:  1. No signal is seen in the proximal left vertebral artery, with some signal seen in what may be a diminutive distal left V4, which may reflect retrograde flow. This could be further evaluated with CTA head and neck, if clinically indicated. 2. Mild stenosis at the origins of the bilateral posterior communicating arteries, with fetal origin of the right PCA and near fetal origin the left PCA. 3. No other significant stenosis or large vessel occlusion in the anterior circulation. These results will be called to the ordering clinician or representative by the Radiologist Assistant, and communication documented in the PACS or [employer]. Electronically Signed   By: Merilyn Baba M.D.   On: 07/14/2022 00:02   EEG adult  Result Date: 07/13/2022 Lora Havens, MD     07/13/2022  7:57 PM Patient Name: Colin Wagner MRN: [id number] Epilepsy Attending: Lora Havens Referring Physician/Provider: Reubin Milan, MD Date: 07/13/2022 Duration: 22.36 mins Patient history: 71 y.o. 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 EEG to evaluate for seizure Level of alertness: Awake AEDs during EEG study: None Technical aspects: This EEG study was done with scalp electrodes positioned according to the 10-20 International system of electrode placement. Electrical activity was reviewed with band pass filter of 1-70Hz , sensitivity of 7 uV/mm, display speed of 26mm/sec with a 60Hz  notched filter applied as appropriate. EEG data were recorded  continuously and digitally stored.  Video monitoring was available and reviewed as appropriate. Description: The posterior dominant rhythm consists of 8-9Hz  activity of moderate voltage (25-35 uV) seen predominantly in posterior head regions, symmetric and reactive to eye opening and eye closing.  Hyperventilation and photic stimulation were not performed.   IMPRESSION: This study is within normal limits. No seizures or epileptiform discharges were seen throughout the recording. A normal interictal EEG does not exclude the diagnosis of epilepsy. Lora Havens   MR Brain W and Wo Contrast  Result Date: 07/13/2022 CLINICAL DATA:  Provided history: Seizure, new onset, no history of trauma. Additional history provided: History of squamous cell cancer of jaw, non-small cell lung cancer. EXAM: MRI HEAD WITHOUT AND WITH CONTRAST TECHNIQUE: Multiplanar, multiecho pulse sequences of the brain and surrounding structures were obtained without and with intravenous contrast. CONTRAST:  8 mL Gadavist intravenous contrast. COMPARISON:  Head CT 07/12/2022.  Brain MRI 05/14/2022. FINDINGS: Mild intermittent motion degradation. Brain: Mild generalized cerebral atrophy. No cortical encephalomalacia is identified. No significant cerebral white matter disease for age. No appreciable hippocampal size or signal asymmetry. There is no acute infarct. No evidence of an intracranial mass. No chronic intracranial blood products. No extra-axial fluid collection. No midline shift. No pathologic intracranial enhancement identified. Vascular: Redemonstrated developmental venous anomaly within the anterolateral left frontal lobe. Somewhat bulbous appearance of the proximal basilar artery (instance as seen on series 17, image 15) (series 8, image 5). Skull and upper cervical spine: No focal suspicious marrow lesion. Sinuses/Orbits: No mass or acute finding within the imaged orbits. Chronic medially displaced fracture deformity of the right  lamina papyracea. Mild-to-moderate mucosal thickening within the left maxillary sinus. Mucosal thickening within the bilateral ethmoid air cells (mild-to-moderate right, minimal left). Other: Large left middle ear/mastoid effusion. Incompletely assessed postsurgical changes to the left mandible. IMPRESSION: 1. Mildly motion degraded exam. 2. No evidence  of acute intracranial abnormality. 3. No evidence of intracranial metastatic disease. 4. No specific seizure focus is identified. 5. Mild generalized cerebral atrophy. 6. Somewhat bulbous appearance of the proximal basilar artery. MR or CT angiography of the head is recommended to exclude a fusiform aneurysm at this site. 7. Paranasal sinus disease, as described. 8. Large left middle ear/mastoid effusion. Electronically Signed   By: Kellie Simmering D.O.   On: 07/13/2022 08:56   CT Head Wo Contrast  Result Date: 07/12/2022 CLINICAL DATA:  Seizure, new-onset, no history of trauma EXAM: CT HEAD WITHOUT CONTRAST TECHNIQUE: Contiguous axial images were obtained from the base of the skull through the vertex without intravenous contrast. RADIATION DOSE REDUCTION: This exam was performed according to the departmental dose-optimization program which includes automated exposure control, adjustment of the mA and/or kV according to patient size and/or use of iterative reconstruction technique. COMPARISON:  MRI head 05/14/2022 FINDINGS: Brain: Cerebral ventricle sizes are concordant with the degree of cerebral volume loss. No evidence of large-territorial acute infarction. No parenchymal hemorrhage. No mass lesion. No extra-axial collection. No mass effect or midline shift. No hydrocephalus. Basilar cisterns are patent. Vascular: No hyperdense vessel. Skull: No acute fracture or focal lesion. Sinuses/Orbits: Paranasal sinuses and mastoid air cells are clear. The orbits are unremarkable. Other: None. IMPRESSION: No acute intracranial abnormality. Electronically Signed   By:  Iven Finn M.D.   On: 07/12/2022 23:51   DG Abdomen Acute W/Chest  Result Date: 07/12/2022 CLINICAL DATA:  Distended abdomen, concern for bowel obstruction. EXAM: DG ABDOMEN ACUTE WITH 1 VIEW CHEST COMPARISON:  07/10/2022. FINDINGS: There is no evidence of dilated bowel loops or free intraperitoneal air. No radiopaque calculi or other acute radiographic abnormality is seen. Heart size and mediastinal contours are stable. There is redemonstration of a masslike consolidation in the right upper lobe at the paramediastinal border. No consolidation, effusion, or pneumothorax. A stable chest port is noted on the left. Surgical clips are present in the cervical soft tissues and left axilla. IMPRESSION: 1. No evidence of bowel obstruction or free air. 2. Stable masslike consolidation in the right upper lobe. . Electronically Signed   By: Brett Fairy M.D.   On: 07/12/2022 23:29     ASSESSMENT/PLAN:  This is a very pleasant 71 year old African-American male diagnosed with stage IIIc (T4, N3, M0) non-small cell lung cancer, squamous cell carcinoma.  The patient presented with a large right upper lobe lung mass in addition to mediastinal and bilateral lymphadenopathy.  The patient was diagnosed in September 2023.    He completed a course of concurrent chemoradiation with weekly carboplatin for an AUC of 2 paclitaxel 45 mg per metered square.  The patient is status post 6 cycles.    The plan is to undergo consolidation immunotherapy with Imfinzi 1500 mg IV every 4 weeks.  His first cycle of treatment is scheduled for today.   Labs were reviewed.  Recommend that he proceed with cycle #1 today scheduled.  Will arrange for additional IV fluids to be given in the infusion room today.  Discussed with the patient and his girlfriend if he ever feels like he needs additional IV fluids to please call the clinic so we can help facilitate arranging this.  We will see him back for follow-up visit in 4 weeks for  evaluation and repeat blood work before undergoing cycle #2.  We talked about his weight loss and swallowing today.  The patient no longer has odynophagia.  He has a poor appetite  and has lost a significant amount of weight recently.  He is needing a refill of his Megace.  His girlfriend has been giving him some protein packets from [employer] which she thinks has helped his appetite the last couple days.  They are hopeful that when they see gastroenterology on 08/26/2022 that they will be able to arrange for EGD and possible dilation to avoid having to have a PEG tube placed.   The patient was advised to call immediately if she has any concerning symptoms in the interval. The patient voices understanding of current disease status and treatment options and is in agreement with the current care plan. All questions were answered. The patient knows to call the clinic with any problems, questions or concerns. We can certainly see the patient much sooner if necessary      No orders of the defined types were placed in this encounter.     The total time spent in the appointment was 20-29 minutes.   Aldine Grainger L Sahvanna Mcmanigal, PA-C 08/11/22

## 2022-08-09 NOTE — ED Triage Notes (Signed)
Pt presented via [facility] c/o seizure like episode tonight. Pt on seizure meds and seen here for same but nothing was found. Pt A& o x4. No complaints's. IV started and 500 bolus.

## 2022-08-10 ENCOUNTER — Other Ambulatory Visit: Payer: Self-pay

## 2022-08-10 ENCOUNTER — Telehealth: Payer: Self-pay

## 2022-08-10 ENCOUNTER — Emergency Department (HOSPITAL_COMMUNITY): Payer: Medicare Other

## 2022-08-10 DIAGNOSIS — R569 Unspecified convulsions: Secondary | ICD-10-CM | POA: Diagnosis not present

## 2022-08-10 LAB — CBC WITH DIFFERENTIAL/PLATELET
Abs Immature Granulocytes: 0.03 10*3/uL (ref 0.00–0.07)
Basophils Absolute: 0 10*3/uL (ref 0.0–0.1)
Basophils Relative: 0 %
Eosinophils Absolute: 0.1 10*3/uL (ref 0.0–0.5)
Eosinophils Relative: 1 %
HCT: 31.8 % — ABNORMAL LOW (ref 39.0–52.0)
Hemoglobin: 10.1 g/dL — ABNORMAL LOW (ref 13.0–17.0)
Immature Granulocytes: 0 %
Lymphocytes Relative: 3 %
Lymphs Abs: 0.3 10*3/uL — ABNORMAL LOW (ref 0.7–4.0)
MCH: 26.9 pg (ref 26.0–34.0)
MCHC: 31.8 g/dL (ref 30.0–36.0)
MCV: 84.6 fL (ref 80.0–100.0)
Monocytes Absolute: 0.6 10*3/uL (ref 0.1–1.0)
Monocytes Relative: 6 %
Neutro Abs: 9 10*3/uL — ABNORMAL HIGH (ref 1.7–7.7)
Neutrophils Relative %: 90 %
Platelets: 312 10*3/uL (ref 150–400)
RBC: 3.76 MIL/uL — ABNORMAL LOW (ref 4.22–5.81)
RDW: 21.4 % — ABNORMAL HIGH (ref 11.5–15.5)
WBC: 10.1 10*3/uL (ref 4.0–10.5)
nRBC: 0 % (ref 0.0–0.2)

## 2022-08-10 LAB — COMPREHENSIVE METABOLIC PANEL
ALT: 12 U/L (ref 0–44)
AST: 14 U/L — ABNORMAL LOW (ref 15–41)
Albumin: 3.9 g/dL (ref 3.5–5.0)
Alkaline Phosphatase: 84 U/L (ref 38–126)
Anion gap: 9 (ref 5–15)
BUN: 51 mg/dL — ABNORMAL HIGH (ref 8–23)
CO2: 28 mmol/L (ref 22–32)
Calcium: 10 mg/dL (ref 8.9–10.3)
Chloride: 104 mmol/L (ref 98–111)
Creatinine, Ser: 1.97 mg/dL — ABNORMAL HIGH (ref 0.61–1.24)
GFR, Estimated: 35 mL/min — ABNORMAL LOW (ref >60)
Glucose, Bld: 180 mg/dL — ABNORMAL HIGH (ref 70–99)
Potassium: 5.2 mmol/L — ABNORMAL HIGH (ref 3.5–5.1)
Sodium: 141 mmol/L (ref 135–145)
Total Bilirubin: 0.7 mg/dL (ref 0.3–1.2)
Total Protein: 8 g/dL (ref 6.5–8.1)

## 2022-08-10 NOTE — Discharge Instructions (Signed)
Continue medications as previously prescribed.  Follow-up with your neurology appointment as previously scheduled, and return to the ER if symptoms significantly worsen or change.

## 2022-08-10 NOTE — ED Notes (Signed)
Pt to ct 

## 2022-08-10 NOTE — Telephone Encounter (Signed)
This nurse returned call to Dr. Burnard Hawthorne office related to patient's weight loss. Per this providers request spoke with practice nurse and made her aware that patient has received radiation treatment to his neck.  He is also being followed by GI for an acute GI bleed that led to an EGD being completed on 12/4 that showed moderately severe erosive esophagitis with bleeding.  All of theses causes make it difficult for the patient to swallow.  Advised that patient may benefit from gastric tube.  Nurse acknowledged understanding and stated that information will be relayed.  No further questions or concerns noted at this time.

## 2022-08-10 NOTE — ED Notes (Signed)
Went over [employer].  Wheeled out to lobby. Ambulatory to car with s/o.

## 2022-08-11 ENCOUNTER — Inpatient Hospital Stay: Payer: Medicare Other

## 2022-08-11 ENCOUNTER — Ambulatory Visit: Payer: Medicare Other

## 2022-08-11 ENCOUNTER — Other Ambulatory Visit: Payer: Medicare Other

## 2022-08-11 ENCOUNTER — Other Ambulatory Visit: Payer: Self-pay | Admitting: Physician Assistant

## 2022-08-11 ENCOUNTER — Other Ambulatory Visit: Payer: Self-pay

## 2022-08-11 ENCOUNTER — Inpatient Hospital Stay (HOSPITAL_BASED_OUTPATIENT_CLINIC_OR_DEPARTMENT_OTHER): Payer: Medicare Other | Admitting: Physician Assistant

## 2022-08-11 ENCOUNTER — Inpatient Hospital Stay: Payer: Medicare Other | Admitting: Dietician

## 2022-08-11 VITALS — BP 115/60 | HR 87 | Temp 98.3°F | Resp 16 | Wt 153.1 lb

## 2022-08-11 DIAGNOSIS — C3411 Malignant neoplasm of upper lobe, right bronchus or lung: Secondary | ICD-10-CM

## 2022-08-11 DIAGNOSIS — R627 Adult failure to thrive: Secondary | ICD-10-CM | POA: Diagnosis not present

## 2022-08-11 DIAGNOSIS — E860 Dehydration: Secondary | ICD-10-CM

## 2022-08-11 DIAGNOSIS — R634 Abnormal weight loss: Secondary | ICD-10-CM | POA: Diagnosis not present

## 2022-08-11 DIAGNOSIS — Z5112 Encounter for antineoplastic immunotherapy: Secondary | ICD-10-CM | POA: Diagnosis not present

## 2022-08-11 DIAGNOSIS — Z95828 Presence of other vascular implants and grafts: Secondary | ICD-10-CM

## 2022-08-11 DIAGNOSIS — T17320A Food in larynx causing asphyxiation, initial encounter: Secondary | ICD-10-CM

## 2022-08-11 LAB — CBC WITH DIFFERENTIAL (CANCER CENTER ONLY)
Abs Immature Granulocytes: 0.01 10*3/uL (ref 0.00–0.07)
Basophils Absolute: 0 10*3/uL (ref 0.0–0.1)
Basophils Relative: 1 %
Eosinophils Absolute: 0.2 10*3/uL (ref 0.0–0.5)
Eosinophils Relative: 3 %
HCT: 32.5 % — ABNORMAL LOW (ref 39.0–52.0)
Hemoglobin: 10.5 g/dL — ABNORMAL LOW (ref 13.0–17.0)
Immature Granulocytes: 0 %
Lymphocytes Relative: 3 %
Lymphs Abs: 0.2 10*3/uL — ABNORMAL LOW (ref 0.7–4.0)
MCH: 27 pg (ref 26.0–34.0)
MCHC: 32.3 g/dL (ref 30.0–36.0)
MCV: 83.5 fL (ref 80.0–100.0)
Monocytes Absolute: 0.6 10*3/uL (ref 0.1–1.0)
Monocytes Relative: 7 %
Neutro Abs: 6.9 10*3/uL (ref 1.7–7.7)
Neutrophils Relative %: 86 %
Platelet Count: 390 10*3/uL (ref 150–400)
RBC: 3.89 MIL/uL — ABNORMAL LOW (ref 4.22–5.81)
RDW: 21.2 % — ABNORMAL HIGH (ref 11.5–15.5)
WBC Count: 8 10*3/uL (ref 4.0–10.5)
nRBC: 0 % (ref 0.0–0.2)

## 2022-08-11 LAB — CMP (CANCER CENTER ONLY)
ALT: 13 U/L (ref 0–44)
AST: 16 U/L (ref 15–41)
Albumin: 3.5 g/dL (ref 3.5–5.0)
Alkaline Phosphatase: 79 U/L (ref 38–126)
Anion gap: 9 (ref 5–15)
BUN: 52 mg/dL — ABNORMAL HIGH (ref 8–23)
CO2: 28 mmol/L (ref 22–32)
Calcium: 10.3 mg/dL (ref 8.9–10.3)
Chloride: 104 mmol/L (ref 98–111)
Creatinine: 1.96 mg/dL — ABNORMAL HIGH (ref 0.61–1.24)
GFR, Estimated: 36 mL/min — ABNORMAL LOW (ref >60)
Glucose, Bld: 203 mg/dL — ABNORMAL HIGH (ref 70–99)
Potassium: 4.8 mmol/L (ref 3.5–5.1)
Sodium: 141 mmol/L (ref 135–145)
Total Bilirubin: 0.6 mg/dL (ref 0.3–1.2)
Total Protein: 8.2 g/dL — ABNORMAL HIGH (ref 6.5–8.1)

## 2022-08-11 LAB — TSH: TSH: 2.257 u[IU]/mL (ref 0.350–4.500)

## 2022-08-11 MED ORDER — SODIUM CHLORIDE 0.9 % IV SOLN
1500.0000 mg | Freq: Once | INTRAVENOUS | Status: AC
  Administered 2022-08-11: 1500 mg via INTRAVENOUS
  Filled 2022-08-11: qty 30

## 2022-08-11 MED ORDER — SODIUM CHLORIDE 0.9 % IV SOLN: Freq: Once | INTRAVENOUS | Status: AC

## 2022-08-11 MED ORDER — HEPARIN SOD (PORK) LOCK FLUSH 100 UNIT/ML IV SOLN: 500.0000 [IU] | Freq: Once | INTRAVENOUS | Status: DC | PRN

## 2022-08-11 MED ORDER — SODIUM CHLORIDE 0.9% FLUSH: 10.0000 mL | INTRAVENOUS | Status: DC | PRN

## 2022-08-11 MED ORDER — SODIUM CHLORIDE 0.9% FLUSH
10.0000 mL | INTRAVENOUS | Status: AC | PRN
  Administered 2022-08-11: 10 mL

## 2022-08-11 NOTE — Progress Notes (Signed)
Nutrition Follow-up:  Patient with non-small cell lung cancer. He is receiving consolidation therapy with durvalumab q28d. Patient completed concurrent chemoradiation therapy (final radiation 11/14) under the care of Dr. Isidore Moos. He is followed by Dr. Julien Nordmann.   Noted recent ED visits 12/4 EGD - severe esophagitis with bleeding   Met with patient in infusion. His fiance is present for visit. Notified by infusion RN pt with significant dysphagia with Ensure. Patient using suction machine and reported this has become normal for him. Patient is taking carafate Patient reports appetite is getting better and weights are trending up per fiance. He is taking megace twice daily for appetite. Fiance recalls patient tolerating soft moist smooth textures. Yesterday he had pudding, couple bowls of ice cream, and 2 glucerna. She is adding protein powder to foods which she feels is helping with wt gain. Patient reports he tolerates thin liquids better. He is agreeable to pouring Ensure over ice to water down the consistency. Fiance reports blood sugars have been more stable. He is staying ~200s. She denies further episodes of hypoglycemia.   Discussed concerns regarding severe wt loss over the last month and patients ability to maintain oral intake given significant dysphagia requiring frequent suctioning. Patient is agreeable to having another PEG for nutrition support. He reports upcoming appointment with GI on 1/4 to discuss additional esophageal dilation.   Medications: Megace, norco, reglan, protonix, compazine, carafate  Labs: glucose 203, BUN 52, Cr 1.96  Anthropometrics: Wt 153 lb 1.6 oz today decreased 16% (30 lbs) in 4 weeks - this is severe  12/3 - 171 lb 15.7 oz  11/20 - 182 lb 15.7 oz  Estimated Energy Needs  Kcals: 2082-2290 Protein: 104-118 Fluid: >2 L  NUTRITION DIAGNOSIS: Unintended weight loss continues    INTERVENTION:  Discussed with PA-C - IR referral placed  Educated on watering  down supplements (pouring over ice, adding milk to thin) Encouraged small sips/bites of soft smooth textures frequently throughout the day Would favor pt drinking Ensure Plus HP/equivalent vs glucerna to offer more calories - recommend 3 daily. Pt is monitoring blood sugars at home  If PEG is placed, recommend  -6 cartons Glucerna 1.5 split over 3 feedings/day. Flush tube with 180 ml water 5x/day. Provides 2136 kcal, 117.6 g protein, 1080 ml free water (1980 ml total water with flushes) Given severe wt loss, pt is at risk for refeeding. Recommend obtaining baseline labs (Mg, K, Phos) and monitoring twice weekly until stable    MONITORING, EVALUATION, GOAL: weight trends, intake, PEG?   NEXT VISIT: Wednesday January 17 during infusion (will monitor for PEG placement and see pt sooner as needed)

## 2022-08-11 NOTE — Patient Instructions (Signed)
[facility]  Discharge Instructions: Thank you for choosing [facility] to provide your oncology and hematology care.   If you have a lab appointment with the Cancer Center, please go directly to the Cancer Center and check in at the registration area.   Wear comfortable clothing and clothing appropriate for easy access to any Portacath or PICC line.   We strive to give you quality time with your provider. You may need to reschedule your appointment if you arrive late (15 or more minutes).  Arriving late affects you and other patients whose appointments are after yours.  Also, if you miss three or more appointments without notifying the office, you may be dismissed from the clinic at the provider's discretion.      For prescription refill requests, have your pharmacy contact our office and allow 72 hours for refills to be completed.    Today you received the following chemotherapy and/or immunotherapy agents; Imfinzi      To help prevent nausea and vomiting after your treatment, we encourage you to take your nausea medication as directed.  BELOW ARE SYMPTOMS THAT SHOULD BE REPORTED IMMEDIATELY: *FEVER GREATER THAN 100.4 F (38 C) OR HIGHER *CHILLS OR SWEATING *NAUSEA AND VOMITING THAT IS NOT CONTROLLED WITH YOUR NAUSEA MEDICATION *UNUSUAL SHORTNESS OF BREATH *UNUSUAL BRUISING OR BLEEDING *URINARY PROBLEMS (pain or burning when urinating, or frequent urination) *BOWEL PROBLEMS (unusual diarrhea, constipation, pain near the anus) TENDERNESS IN MOUTH AND THROAT WITH OR WITHOUT PRESENCE OF ULCERS (sore throat, sores in mouth, or a toothache) UNUSUAL RASH, SWELLING OR PAIN  UNUSUAL VAGINAL DISCHARGE OR ITCHING   Items with * indicate a potential emergency and should be followed up as soon as possible or go to the Emergency Department if any problems should occur.  Please show the CHEMOTHERAPY ALERT CARD or IMMUNOTHERAPY ALERT CARD at check-in to  the Emergency Department and triage nurse.  Should you have questions after your visit or need to cancel or reschedule your appointment, please contact [facility]  Dept: [phone]  and follow the prompts.  Office hours are 8:00 a.m. to 4:30 p.m. Monday - Friday. Please note that voicemails left after 4:00 p.m. may not be returned until the following business day.  We are closed weekends and major holidays. You have access to a nurse at all times for urgent questions. Please call the main number to the clinic Dept: [phone] and follow the prompts.   For any non-urgent questions, you may also contact your provider using MyChart. We now offer e-Visits for anyone 18 and older to request care online for non-urgent symptoms. For details visit [email or web address].   Also download the MyChart app! Go to the app store, search "MyChart", open the app, select [facility], and log in with your MyChart username and password.  Masks are optional in the cancer centers. If you would like for your care team to wear a mask while they are taking care of you, please let them know. You may have one support person who is at least 72 years old accompany you for your appointments. 

## 2022-08-11 NOTE — Progress Notes (Signed)
Per Cassie, PA ok to proceed with Scr. IVF added to tx.

## 2022-08-12 ENCOUNTER — Other Ambulatory Visit: Payer: Self-pay

## 2022-08-12 ENCOUNTER — Encounter (HOSPITAL_COMMUNITY): Payer: Self-pay

## 2022-08-12 ENCOUNTER — Encounter (HOSPITAL_COMMUNITY): Payer: Self-pay | Admitting: Interventional Radiology

## 2022-08-12 LAB — T4: T4, Total: 5 ug/dL (ref 4.5–12.0)

## 2022-08-12 NOTE — Progress Notes (Signed)
RE: G-TUBE Received: Today Mugweru, Wille Glaser, MD  Anell Barr; [place] [facility] Procedure Requests PROCEDURE REVIEW Date: 08/12/22  Requested Procedure: Gastrostomy Reason for request: Dysphagia  Imaging review: I reviewed all pertinent diagnostic studies, including; CT chest, 07/10/22 Recommended imaging modality to perform biopsy: IR / Fluoroscopy  Additional comments: Anatomy is amenable for procedure. @[facility]IR . Pt with prior G-tube, removed 02/08/20, tract apparent on CT @Scheduler . No need for Barium. Pt on DAPT. Will need 5 day hold.  Please contact me with questions, concerns, or if issue pertaining to this request arise.  Michaelle Birks, MD [facility]  Pager. [place]

## 2022-08-15 ENCOUNTER — Other Ambulatory Visit: Payer: Self-pay

## 2022-08-18 ENCOUNTER — Other Ambulatory Visit (HOSPITAL_COMMUNITY): Payer: Self-pay

## 2022-08-20 ENCOUNTER — Other Ambulatory Visit: Payer: Self-pay

## 2022-08-24 ENCOUNTER — Ambulatory Visit (INDEPENDENT_AMBULATORY_CARE_PROVIDER_SITE_OTHER): Payer: Medicare Other | Admitting: Neurology

## 2022-08-24 ENCOUNTER — Ambulatory Visit: Payer: Medicaid Other | Admitting: Neurology

## 2022-08-24 ENCOUNTER — Encounter: Payer: Self-pay | Admitting: Neurology

## 2022-08-24 VITALS — BP 75/44 | HR 101 | Ht 71.0 in | Wt 151.0 lb

## 2022-08-24 DIAGNOSIS — R569 Unspecified convulsions: Secondary | ICD-10-CM

## 2022-08-24 MED ORDER — LEVETIRACETAM 100 MG/ML PO SOLN: ORAL | 12 refills | Status: DC

## 2022-08-24 NOTE — Progress Notes (Unsigned)
NEUROLOGY CONSULTATION NOTE  Colin Wagner MRN: [id number] DOB: 07/14/1951  Referring provider: Dr. Roland Rack Primary care provider: Dr. [name]  Reason for consult:  seizure-like activity  Dear Dr Leonel Ramsay:  Thank you for your kind referral of Colin Wagner for consultation of the above symptoms. Although his history is well known to you, please allow me to reiterate it for the purpose of our medical record. The patient was accompanied to the clinic by Vivia Birmingham who also provides collateral information. Records and images were personally reviewed where available.   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 Records on EPIC were reviewed. His fiancee Jeanett Schlein provides most of the history. He was in his usual state of health until November 2023 when he started having episodes that would only occur at night, he would sit up in bed then go rigid and fall back in bed, stiff with shaking for 1-2 minutes, eyes closed. He would be able to talk after but want to sleep again. He initially had one, then started having 2-3, he would sit up each time then fall backward. Jeanett Schlein notes they were not happening when asleep, they would always happen when he sits up then he falls back. He had urinary incontinence with some of them. She brought him to the ER on 07/13/22 due to recurrent episodes. 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 EEG was within normal limits. He had no further episodes during his admission, and after discussing options, they agreed to start [place] instead of doing inpatient vEEG monitoring since there were no  further events. He was back in the ER on 07/25/22 after an episode of left-sided weakness and aphasia. Per notes, he had an episode the night prior and was hypoglycemic, given d50 by EMS. That morning, he woke up with similar symptoms and when EMS arrived, glucose was 25 per [place]. He was given more d50 with improvement in symptoms and was back to baseline in the ER. He had a CTA head/neck which showed chronic occlusion of the left vertebral artery at the level of a severe left subclavian plaque with stenosis, widely patent right cervical carotid artery, moderate generalized atheromatous irregularity of intracranial vessels. He was back in the ER on 08/09/22 after having 4 episodes of falling back in bed and shaking all over lasting around 30 seconds each. He would not ret respond during them, then start coming to. No changes were made to medication. Jeanett Schlein reports that he continued to have episodes every night except for last night. Initially she thought Trazodone was making symptoms worse, he has been off it since November. She feels the Keppra has helped, he is not having shaking, but still would fall backward after sitting up, talking a little after. He is unaware they are happening, no prior warning symptoms. Jeanett Schlein denies any daytime episodes, no significant staring/unresponsive episodes during the day. She has known him for 8 years and has seen him go into a "thinking daze." He denies any olfactory/gustatory hallucinations, deja vu, rising epigastric sensation, focal numbness/tingling/weakness, myoclonic jerks.   Jeannette RH No warning, unaware of them when he comes to Urinary incont some of them "Sz-like" falls back, no shaking,  talking a little (2 nights ago); last night was first time no episode at all At worst with the Trazodone, went back home and restarted trazodone 2 tabs then had them so she stopped it Still having but not as harsh Lip fell back down, speech slurred Sz look the  same Sz sx: - Occl HA on the frontal; no n/v; sometimes has dizziness Putting a feeding tube on 1/8, meds are crushed Needs something for sleep; not related to sleep dep Wakes up, goes to sit up to get something to drink, then falls back; initially there was shaking and stiffness Since starting keppra, just the falling back with no shaking; fals back iniitlaly not responding then starts responding Having them every inght except when he is in the hospital Glucometer running 200s Speech a little more slurred, had surgery to lift lip up after surgery but now drooped more on left side No focal sx RF: mother stroke; sz from drinking uncle maternal;  No side effects Fiancee preps them and he takes by himself Lorazepam four times a day for anxiety; no missed doses Not when asleep, not during the day   Poor eye contact Not diaphoretic; at one point was pale Has BP monitor at home, normal; wrist one; has not been able to check pressure during them  No clear staring; just go into a thinking daze, he used to do that before (known for 77yr)   Lower mouth asymmetric, food coming out, new from before Nasolabial fold symmetric +1 throughout, tongue to left Gait ok He was driving before Nov;  Mood so-so; situational   Seizure symptoms: The patient denies any  Epilepsy Risk Factors:  *** had a normal birth and early development.  There is no history of febrile convulsions, CNS infections such as meningitis/encephalitis, significant traumatic brain injury, neurosurgical procedures, or family history of seizures.  Prior AEDs: Laboratory Data:  EEGs: MRI:   PAST MEDICAL HISTORY: Past Medical History:  Diagnosis Date   Alcohol abuse    Alcoholic pancreatitis 22878  admission   Chronic back pain    Chronic pancreatitis ([facility]    based on ct findings 2016   COPD (chronic obstructive pulmonary disease) ([facility]    Diabetes mellitus    type 2   Diverticulosis    Gastritis    GERD  (gastroesophageal reflux disease)    Headache    History of radiation therapy 05/12/17- 06/22/17   Left cheek and bilateral neck/ 60 Gy in 30 fractions to gross disease   Hyperlipidemia    Hypertension    [place]aw cancer [place]    left jaw part of jaw bone removed   Peptic ulcer disease 1999   Per medical reports, no H pylori   Pneumonia    Renal cancer, left ([facility] 2012   he tells me that he has been released, ?and that he is free of cancer, and never had it to begin with.    Right shoulder pain    Testicular hypofunction     PAST SURGICAL HISTORY: Past Surgical History:  Procedure Laterality Date   BALLOON DILATION  12/31/2021   Procedure: BALLOON DILATION;  Surgeon: RDaneil [name] MD;  [facility];  Service: Endoscopy;;   BIOPSY  04/30/2022   Procedure: BIOPSY;  Surgeon: MMaryjane Hurter MD;  [facility];  Service: Pulmonary;;   CAROTID STENT  06/2018   COLONOSCOPY  2003   Dr. LIrving Shows polyps   COLONOSCOPY  2005   Dr. LLeane Para  Tamala Julian, multiple diverticula   COLONOSCOPY  2008   Dr. [name], diverticulosis   COLONOSCOPY WITH PROPOFOL N/A 10/21/2016   Dr. [name]: Diverticulosis, two 5-7 mm polyps removed. path-tubular adenomas.  Next colonoscopy in 5 years.   COLONOSCOPY WITH PROPOFOL N/A 12/31/2021   Procedure: COLONOSCOPY WITH PROPOFOL;  Surgeon: [name], MD;  [facility];  Service: Endoscopy;  Laterality: N/A;  8:15am   ENDOBRONCHIAL ULTRASOUND Right 04/30/2022   Procedure: ENDOBRONCHIAL ULTRASOUND;  Surgeon: Maryjane Hurter, MD;  [facility];  Service: Pulmonary;  Laterality: Right;   ESOPHAGEAL BRUSHING  07/26/2022   Procedure: ESOPHAGEAL BRUSHING;  Surgeon: [name], DO;  [facility];  Service: Endoscopy;;   ESOPHAGEAL DILATION N/A 07/26/2022   Procedure: ESOPHAGEAL DILATION;  Surgeon: [name], DO;  [facility];  Service: Endoscopy;  Laterality: N/A;    ESOPHAGOGASTRODUODENOSCOPY     Multiple EGDs. 1999 EGD showed gastric ulcers, no H pylori and benign biopsies performed by Dr. [name]. 2001 gastric ulcer healed. Last EGD 2005 had gastritis.   ESOPHAGOGASTRODUODENOSCOPY (EGD) WITH PROPOFOL N/A 12/31/2021   narrowing/deformity at UES s/p baloon dilation   ESOPHAGOGASTRODUODENOSCOPY (EGD) WITH PROPOFOL N/A 07/26/2022   Procedure: ESOPHAGOGASTRODUODENOSCOPY (EGD) WITH PROPOFOL;  Surgeon: [name], DO;  [facility];  Service: Endoscopy;  Laterality: N/A;   FINE NEEDLE ASPIRATION  04/30/2022   Procedure: FINE NEEDLE ASPIRATION (FNA) LINEAR;  Surgeon: Maryjane Hurter, MD;  [facility];  Service: Pulmonary;;   IR GASTROSTOMY TUBE REMOVAL  02/08/2020   IR IMAGING GUIDED PORT INSERTION  05/26/2022   IR REPLACE G-TUBE SIMPLE WO FLUORO  03/20/2019   IR REPLACE G-TUBE SIMPLE WO FLUORO  09/21/2019   IR REPLC GASTRO/COLONIC TUBE PERCUT W/FLUORO  05/16/2018   Left partial mandibulectomy, Scapular free flap reconstruction, selective neck dissection, tracheotomy, and resection of intraoral palate cancer. Left 03/01/2017   [facility]   LUNG BIOPSY     MOUTH SURGERY     PARTIAL NEPHRECTOMY Left 2012   POLYPECTOMY  10/21/2016   Procedure: POLYPECTOMY;  Surgeon: [name], MD;  [facility];  Service: Endoscopy;;  hepatic flexure x2   POLYPECTOMY  12/31/2021   Procedure: POLYPECTOMY INTESTINAL;  Surgeon: [name], MD;  [facility];  Service: Endoscopy;;   SURGERY OF LIP  06/2019   lip revision   TRANSURETHRAL RESECTION OF PROSTATE N/A 03/01/2019   Procedure: TRANSURETHRAL RESECTION OF THE PROSTATE (TURP)WITH CYSTOSCOPY;  Surgeon: Raynelle Bring, MD;  [facility];  Service: Urology;  Laterality: N/A;   VIDEO BRONCHOSCOPY Right 04/30/2022   Procedure: VIDEO BRONCHOSCOPY WITHOUT FLUORO;  Surgeon: Maryjane Hurter, MD;  [facility];  Service:  Pulmonary;  Laterality: Right;    MEDICATIONS: Current Outpatient Medications on File Prior to Visit  Medication Sig Dispense Refill   acetaminophen (TYLENOL) 500 MG tablet Take 1,000 mg by mouth every 6 (six) hours as needed for mild pain or moderate pain.     albuterol (VENTOLIN HFA) 108 (90 Base) MCG/ACT inhaler Inhale 2 puffs into the lungs every 4 (four) hours as needed for wheezing or shortness of breath.     amLODipine (NORVASC) 10 MG tablet Take 1 tablet (10 mg total) by mouth daily. 30 tablet 5   aspirin EC 81 MG tablet Take 1 tablet (81 mg total) by mouth daily with breakfast. 30 tablet 11   atorvastatin (LIPITOR) 40 MG tablet Take  1 tablet (40 mg total) by mouth daily. 30 tablet 5   cholecalciferol (VITAMIN D) 25 MCG (1000 UNIT) tablet Take 1,000 Units by mouth daily.     clopidogrel (PLAVIX) 75 MG tablet Take 1 tablet (75 mg total) by mouth daily. 30 tablet 5   ferrous sulfate 325 (65 FE) MG tablet Take 1 tablet (325 mg total) by mouth daily with breakfast. 30 tablet 3   fluticasone (FLONASE) 50 MCG/ACT nasal spray Place 2 sprays into both nostrils daily.     HYDROcodone-acetaminophen (NORCO) 7.5-325 MG tablet Take 1 tablet by mouth every 6 (six) hours as needed for moderate pain.     LEVEMIR FLEXTOUCH 100 UNIT/ML FlexTouch Pen Inject 18 Units into the skin at bedtime. (Patient taking differently: Inject 15 Units into the skin at bedtime.) 15 mL 11   levETIRAcetam (KEPPRA) 500 MG tablet Take 1 tablet (500 mg total) by mouth 2 (two) times daily. 60 tablet 1   LORazepam (ATIVAN) 1 MG tablet Take 1 mg by mouth every 6 (six) hours as needed for anxiety or sleep.  5   megestrol (MEGACE) 40 MG/ML suspension Take 10 mLs (400 mg total) by mouth daily. 480 mL 3   metoCLOPramide (REGLAN) 10 MG tablet Take 1 tablet (10 mg total) by mouth every 6 (six) hours as needed (Hiccups.). 20 tablet 1   metoprolol succinate (TOPROL XL) 100 MG 24 hr tablet Take 1 tablet (100 mg total) by mouth daily. Take  with or immediately following a meal. 30 tablet 5   nystatin cream (MYCOSTATIN) Apply to affected area 2 times daily 80 g 0   OVER THE COUNTER MEDICATION Take 10 mLs by mouth 2 (two) times daily. maalox     pantoprazole (PROTONIX) 40 MG tablet Take 1 tablet (40 mg total) by mouth 2 (two) times daily. 60 tablet 3   prochlorperazine (COMPAZINE) 10 MG tablet TAKE 1 TABLET(10 MG) BY MOUTH EVERY 6 HOURS AS NEEDED FOR NAUSEA OR VOMITING (Patient taking differently: Take 10 mg by mouth every 6 (six) hours as needed for vomiting or nausea.) 30 tablet 2   senna (SENOKOT) 8.6 MG TABS tablet Take 1 tablet (8.6 mg total) by mouth at bedtime as needed for mild constipation. May be needed as pain medication can constipate. 120 each 1   sildenafil (VIAGRA) 100 MG tablet Take 100 mg by mouth daily as needed for erectile dysfunction.     sucralfate (CARAFATE) 1 g tablet Take 1 tablet (1 g total) by mouth 4 (four) times daily -  with meals and at bedtime. May dissolve 1 tab and glass of water and drink as needed 120 tablet 1   testosterone cypionate (DEPOTESTOSTERONE CYPIONATE) 200 MG/ML injection Inject 1.5 mLs into the muscle every 14 (fourteen) days.     tiZANidine (ZANAFLEX) 4 MG tablet Take 4 mg by mouth every 6 (six) hours as needed for muscle spasms.     umeclidinium-vilanterol (ANORO ELLIPTA) 62.5-25 MCG/ACT AEPB Inhale 1 puff into the lungs daily. 60 each 5   fluconazole (DIFLUCAN) 100 MG tablet Take 100 mg by mouth daily. (Patient not taking: Reported on 04/28/1883)     GARLIC PO Take 1 tablet by mouth daily. (Patient not taking: Reported on 08/24/2022)     No current facility-administered medications on file prior to visit.    ALLERGIES: Allergies  Allergen Reactions   Trazodone Other (See Comments)    Seizure    FAMILY HISTORY: Family History  Problem Relation Age of Onset  Hypertension Mother    Colon cancer Neg Hx     SOCIAL HISTORY: Social History   Socioeconomic History   Marital  status: Divorced    Spouse name: Not on file   Number of children: 0   Years of education: Not on file   Highest education level: Not on file  Occupational History   Not on file  Tobacco Use   Smoking status: Former    Packs/day: 0.50    Years: 35.00    Total pack years: 17.50    Types: Cigarettes    Quit date: 03/01/2017    Years since quitting: 5.4   Smokeless tobacco: Never   Tobacco comments:    quit 02-2017  Vaping Use   Vaping Use: Never used  Substance and Sexual Activity   Alcohol use: No    Comment: quit 2000 but relapse in 2016. no etoh since hospitalized 2016.    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   Patient with a history of smoking half pack a day for 35+ years. Patient currently only smoking a few cigarettes per day.   The patient has history of alcohol abuse. Patient quit in 2000 and then relapsed in 2016. Patient has not had any alcohol since 2016.   Right handed    Lives at home with fiancee    Social Determinants of Health   Financial Resource Strain: High Risk (06/03/2022)   Overall Financial Resource Strain (CARDIA)    Difficulty of Paying Living Expenses: Very hard  Food Insecurity: No Food Insecurity (07/26/2022)   Hunger Vital Sign    Worried About Running Out of Food in the Last Year: Never true    Ran Out of Food in the Last Year: Never true  Transportation Needs: No Transportation Needs (07/26/2022)   PRAPARE - Hydrologist (Medical): No    Lack of Transportation (Non-Medical): No  Physical Activity: Inactive (07/27/2019)   Exercise Vital Sign    Days of Exercise per Week: 0 days    Minutes of Exercise per Session: 0 min  Stress: No Stress Concern Present (07/27/2019)   [place]    Feeling of Stress : Not at all   Social Connections: Unknown (07/27/2019)   Social Connection and Isolation Panel [NHANES]    Frequency of Communication with Friends and Family: More than three times a week    Frequency of Social Gatherings with Friends and Family: More than three times a week    Attends Religious Services: More than 4 times per year    Active Member of [employer] or Organizations: Yes    Attends Music therapist: More than 4 times per year    Marital Status: Not on file  Intimate Partner Violence: Not At Risk (07/26/2022)   Humiliation, Afraid, Rape, and Kick questionnaire    Fear of Current or Ex-Partner: No    Emotionally Abused: No    Physically Abused: No    Sexually Abused: No     PHYSICAL EXAM: Vitals:   08/24/22 1238  BP: (!) 75/44  Pulse: (!) 101  SpO2: 92%   General: No acute distress Head:  Normocephalic/atraumatic Skin/Extremities: No rash, no edema Neurological Exam: Mental status: alert and oriented to person, place, and time, no dysarthria or aphasia,  Fund of knowledge is appropriate.  Recent and remote memory are intact.  Attention and concentration are normal.    Able to name objects and repeat phrases. Cranial nerves: CN I: not tested CN II: pupils equal, round and reactive to light, visual fields intact CN III, IV, VI:  full range of motion, no nystagmus, no ptosis CN V: facial sensation intact CN VII: upper and lower face symmetric CN VIII: hearing intact to conversation Bulk & Tone: normal, no fasciculations. Motor: 5/5 throughout with no pronator drift. Sensation: intact to light touch, cold, pin, vibration and joint position sense.  No extinction to double simultaneous stimulation.  Romberg test *** Deep Tendon Reflexes: +2 throughout, no ankle clonus Plantar responses: downgoing bilaterally Cerebellar: no incoordination on finger to nose, heel to shin. No dysdiadochokinesia Gait: narrow-based and steady, able to tandem walk adequately. Tremor:  ***   IMPRESSION: This is a *** year old ***-handed *** with a history of ***.  [place] driving laws were discussed with the patient, and *** knows to stop driving after a seizure, until 6 months seizure-free.    The duration of this appointment visit was *** minutes of face-to-face time with the patient.  Greater than 50% of this time was spent in counseling, explanation of diagnosis, planning of further management, and coordination of care.  Thank you for allowing me to participate in the care of this patient. Please do not hesitate to call for any questions or concerns.   Ellouise Newer, M.D.  CC: ***

## 2022-08-24 NOTE — Patient Instructions (Signed)
Good to meet you.  Schedule MRI brain with and without contrast  2. Schedule 3-day home EEG  3. We will switch Keppra to liquid formulation and increase dose: Take Keppra (Levetiracetam) 100mg /ml: 7.81mL twice a day   4. Follow-up after EEG, call for any changes   Seizure Precautions: 1. If medication has been prescribed for you to prevent seizures, take it exactly as directed.  Do not stop taking the medicine without talking to your doctor first, even if you have not had a seizure in a long time.   2. Avoid activities in which a seizure would cause danger to yourself or to others.  Don't operate dangerous machinery, swim alone, or climb in high or dangerous places, such as on ladders, roofs, or girders.  Do not drive unless your doctor says you may.  3. If you have any warning that you may have a seizure, lay down in a safe place where you can't hurt yourself.    4.  No driving for 6 months from last seizure, as per [facility].   Please refer to the following link on the [place] website for more information: http://[email or web address]/[email or web address]/drivingu.cfm   5.  Maintain good sleep hygiene. Avoid alcohol.  6.  Contact your doctor if you have any problems that may be related to the medicine you are taking.  7.  Call 911 and bring the patient back to the ED if:        A.  The seizure lasts longer than 5 minutes.       B.  The patient doesn't awaken shortly after the seizure  C.  The patient has new problems such as difficulty seeing, speaking or moving  D.  The patient was injured during the seizure  E.  The patient has a temperature over 102 F (39C)  F.  The patient vomited and now is having trouble breathing

## 2022-08-26 ENCOUNTER — Ambulatory Visit (INDEPENDENT_AMBULATORY_CARE_PROVIDER_SITE_OTHER): Payer: Medicare Other | Admitting: Gastroenterology

## 2022-08-26 ENCOUNTER — Encounter: Payer: Self-pay | Admitting: *Deleted

## 2022-08-26 ENCOUNTER — Encounter: Payer: Self-pay | Admitting: Gastroenterology

## 2022-08-26 VITALS — BP 155/84 | HR 98 | Temp 97.1°F | Ht 67.0 in | Wt 150.2 lb

## 2022-08-26 DIAGNOSIS — R1319 Other dysphagia: Secondary | ICD-10-CM

## 2022-08-26 NOTE — H&P (View-Only) (Signed)
Gastroenterology Office Note     Primary Care Physician:  Gareth Morgan, MD  Primary Gastroenterologist: Dr. Jena Gauss    Chief Complaint   Chief Complaint  Patient presents with   Follow-up    Still having issues with dysphagia, unable to eat any solid foods.     History of Present Illness   Colin Wagner is a 72 y.o. 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 EGD completed during admission with moderately severe erosive esophagitis with bleeding, s/p biopsy negative for yeast. Here for follow-up to discuss EGD with dilation.     Mylanta as needed. No abdominal pain. Taking PPI BID. No N/V. Only able to tolerate apple sauce, pudding, protein, yogurt. Dysphagia with anything more texturized. Has to puree foods. NO overt GI bleeding.    Past Medical History:  Diagnosis Date   Alcohol abuse    Alcoholic pancreatitis 2016   admission   Chronic back pain    Chronic pancreatitis (HCC)    based on ct findings 2016   COPD (chronic obstructive pulmonary disease) (HCC)    Diabetes mellitus    type 2   Diverticulosis    Gastritis    GERD (gastroesophageal reflux disease)    Headache    History of radiation therapy 05/12/17- 06/22/17   Left cheek and bilateral neck/ 60 Gy in 30 fractions to gross disease   Hyperlipidemia    Hypertension    Jaw cancer (HCC)    left jaw part of jaw bone removed   Peptic ulcer disease 1999   Per medical reports, no H pylori   Pneumonia    Renal cancer, left (HCC) 2012   he tells me that he has been released, ?and that he is free of cancer, and never had it  to begin with.    Right shoulder pain    Testicular hypofunction     Past Surgical History:  Procedure Laterality Date   BALLOON DILATION  12/31/2021   Procedure: BALLOON DILATION;  Surgeon: Corbin Ade, MD;  [facility];  Service: Endoscopy;;   BIOPSY  04/30/2022   Procedure: BIOPSY;  Surgeon: Omar Person, MD;  Location: [facility];  Service: Pulmonary;;   CAROTID STENT  06/2018   COLONOSCOPY  2003   Dr. Elpidio Anis, polyps   COLONOSCOPY  2005   Dr. Elpidio Anis, multiple diverticula   COLONOSCOPY  2008   Dr. Lovell Sheehan, diverticulosis   COLONOSCOPY WITH PROPOFOL N/A 10/21/2016   Dr. Jena Gauss: Diverticulosis, two 5-7 mm polyps removed. path-tubular adenomas.  Next colonoscopy in 5 years.   COLONOSCOPY WITH PROPOFOL N/A 12/31/2021   Procedure: COLONOSCOPY WITH PROPOFOL;  Surgeon: Corbin Ade, MD;  [facility];  Service: Endoscopy;  Laterality: N/A;  8:15am   ENDOBRONCHIAL ULTRASOUND Right 04/30/2022   Procedure: ENDOBRONCHIAL ULTRASOUND;  Surgeon: Omar Person, MD;  [facility];  Service: Pulmonary;  Laterality: Right;   ESOPHAGEAL BRUSHING  07/26/2022   Procedure: ESOPHAGEAL BRUSHING;  Surgeon: Lanelle Bal, DO;  [facility];  Service: Endoscopy;;   ESOPHAGEAL DILATION N/A 07/26/2022  Procedure: ESOPHAGEAL DILATION;  Surgeon: Lanelle Bal, DO;  [facility];  Service: Endoscopy;  Laterality: N/A;   ESOPHAGOGASTRODUODENOSCOPY     Multiple EGDs. 1999 EGD showed gastric ulcers, no H pylori and benign biopsies performed by Dr. Elpidio Anis. 2001 gastric ulcer healed. Last EGD 2005 had gastritis.   ESOPHAGOGASTRODUODENOSCOPY (EGD) WITH PROPOFOL N/A 12/31/2021   narrowing/deformity at [facility] s/p baloon dilation   ESOPHAGOGASTRODUODENOSCOPY (EGD) WITH PROPOFOL N/A 07/26/2022   Procedure: ESOPHAGOGASTRODUODENOSCOPY (EGD) WITH PROPOFOL;  Surgeon: Lanelle Bal, DO;  [facility];  Service: Endoscopy;   Laterality: N/A;   FINE NEEDLE ASPIRATION  04/30/2022   Procedure: FINE NEEDLE ASPIRATION (FNA) LINEAR;  Surgeon: Omar Person, MD;  [facility];  Service: Pulmonary;;   IR GASTROSTOMY TUBE REMOVAL  02/08/2020   IR IMAGING GUIDED PORT INSERTION  05/26/2022   IR REPLACE G-TUBE SIMPLE WO FLUORO  03/20/2019   IR REPLACE G-TUBE SIMPLE WO FLUORO  09/21/2019   IR REPLC GASTRO/COLONIC TUBE PERCUT W/FLUORO  05/16/2018   Left partial mandibulectomy, Scapular free flap reconstruction, selective neck dissection, tracheotomy, and resection of intraoral palate cancer. Left 03/01/2017   [facility]   LUNG BIOPSY     MOUTH SURGERY     PARTIAL NEPHRECTOMY Left 2012   POLYPECTOMY  10/21/2016   Procedure: POLYPECTOMY;  Surgeon: Corbin Ade, MD;  [facility];  Service: Endoscopy;;  hepatic flexure x2   POLYPECTOMY  12/31/2021   Procedure: POLYPECTOMY INTESTINAL;  Surgeon: Corbin Ade, MD;  [facility];  Service: Endoscopy;;   SURGERY OF LIP  06/2019   lip revision   TRANSURETHRAL RESECTION OF PROSTATE N/A 03/01/2019   Procedure: TRANSURETHRAL RESECTION OF THE PROSTATE (TURP)WITH CYSTOSCOPY;  Surgeon: Heloise Purpura, MD;  Location: [facility];  Service: Urology;  Laterality: N/A;   VIDEO BRONCHOSCOPY Right 04/30/2022   Procedure: VIDEO BRONCHOSCOPY WITHOUT FLUORO;  Surgeon: Omar Person, MD;  [facility];  Service: Pulmonary;  Laterality: Right;    Current Outpatient Medications  Medication Sig Dispense Refill   acetaminophen (TYLENOL) 500 MG tablet Take 1,000 mg by mouth every 6 (six) hours as needed for mild pain or moderate pain.     albuterol (VENTOLIN HFA) 108 (90 Base) MCG/ACT inhaler Inhale 2 puffs into the lungs every 4 (four) hours as needed for wheezing or shortness of breath.     amLODipine (NORVASC) 10 MG tablet Take 1 tablet (10 mg total) by mouth daily. 30 tablet 5   aspirin EC 81 MG tablet Take 1 tablet (81  mg total) by mouth daily with breakfast. 30 tablet 11   atorvastatin (LIPITOR) 40 MG tablet Take 1 tablet (40 mg total) by mouth daily. 30 tablet 5   cholecalciferol (VITAMIN D) 25 MCG (1000 UNIT) tablet Take 1,000 Units by mouth daily.     clopidogrel (PLAVIX) 75 MG tablet Take 1 tablet (75 mg total) by mouth daily. 30 tablet 5   ferrous sulfate 325 (65 FE) MG tablet Take 1 tablet (325 mg total) by mouth daily with breakfast. 30 tablet 3   fluticasone (FLONASE) 50 MCG/ACT nasal spray Place 2 sprays into both nostrils daily.     HYDROcodone-acetaminophen (NORCO) 7.5-325 MG tablet Take 1 tablet by mouth every 6 (six) hours as needed for moderate pain.     LEVEMIR FLEXTOUCH 100 UNIT/ML FlexTouch Pen Inject 18 Units into the skin at bedtime. (Patient taking differently: Inject 15 Units into the skin  at bedtime.) 15 mL 11   levETIRAcetam (KEPPRA) 100 MG/ML solution Take 7.5 mL (750mg ) twice a day 450 mL 12   LORazepam (ATIVAN) 1 MG tablet Take 1 mg by mouth every 6 (six) hours as needed for anxiety or sleep.  5   megestrol (MEGACE) 40 MG/ML suspension Take 10 mLs (400 mg total) by mouth daily. 480 mL 3   metoCLOPramide (REGLAN) 10 MG tablet Take 1 tablet (10 mg total) by mouth every 6 (six) hours as needed (Hiccups.). 20 tablet 1   metoprolol succinate (TOPROL XL) 100 MG 24 hr tablet Take 1 tablet (100 mg total) by mouth daily. Take with or immediately following a meal. 30 tablet 5   nystatin cream (MYCOSTATIN) Apply to affected area 2 times daily 80 g 0   OVER THE COUNTER MEDICATION Take 10 mLs by mouth 2 (two) times daily. maalox     pantoprazole (PROTONIX) 40 MG tablet Take 1 tablet (40 mg total) by mouth 2 (two) times daily. 60 tablet 3   prochlorperazine (COMPAZINE) 10 MG tablet TAKE 1 TABLET(10 MG) BY MOUTH EVERY 6 HOURS AS NEEDED FOR NAUSEA OR VOMITING (Patient taking differently: Take 10 mg by mouth every 6 (six) hours as needed for vomiting or nausea.) 30 tablet 2   senna (SENOKOT) 8.6 MG TABS  tablet Take 1 tablet (8.6 mg total) by mouth at bedtime as needed for mild constipation. May be needed as pain medication can constipate. 120 each 1   sildenafil (VIAGRA) 100 MG tablet Take 100 mg by mouth daily as needed for erectile dysfunction.     sucralfate (CARAFATE) 1 g tablet Take 1 tablet (1 g total) by mouth 4 (four) times daily -  with meals and at bedtime. May dissolve 1 tab and glass of water and drink as needed 120 tablet 1   testosterone cypionate (DEPOTESTOSTERONE CYPIONATE) 200 MG/ML injection Inject 1.5 mLs into the muscle every 14 (fourteen) days.     tiZANidine (ZANAFLEX) 4 MG tablet Take 4 mg by mouth every 6 (six) hours as needed for muscle spasms.     umeclidinium-vilanterol (ANORO ELLIPTA) 62.5-25 MCG/ACT AEPB Inhale 1 puff into the lungs daily. 60 each 5   No current facility-administered medications for this visit.    Allergies as of 08/26/2022 - Review Complete 08/26/2022  Allergen Reaction Noted   Trazodone Other (See Comments) 07/25/2022    Family History  Problem Relation Age of Onset   Hypertension Mother    Colon cancer Neg Hx     Social History   Socioeconomic History   Marital status: Divorced    Spouse name: Not on file   Number of children: 0   Years of education: Not on file   Highest education level: Not on file  Occupational History   Not on file  Tobacco Use   Smoking status: Former    Packs/day: 0.50    Years: 35.00    Total pack years: 17.50    Types: Cigarettes    Quit date: 03/01/2017    Years since quitting: 5.4   Smokeless tobacco: Never   Tobacco comments:    quit 02-2017  Vaping Use   Vaping Use: Never used  Substance and Sexual Activity   Alcohol use: No    Comment: quit 2000 but relapse in 2016. no etoh since hospitalized 2016.    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   Patient with  a history of smoking half pack a day for 35+ years. Patient currently only smoking a few cigarettes per day.   The patient has history of alcohol abuse. Patient quit in 2000 and then relapsed in 2016. Patient has not had any alcohol since 2016.   Right handed    Lives at home with fiancee    Social Determinants of Health   Financial Resource Strain: High Risk (06/03/2022)   Overall Financial Resource Strain (CARDIA)    Difficulty of Paying Living Expenses: Very hard  Food Insecurity: No Food Insecurity (07/26/2022)   Hunger Vital Sign    Worried About Running Out of Food in the Last Year: Never true    Ran Out of Food in the Last Year: Never true  Transportation Needs: No Transportation Needs (07/26/2022)   PRAPARE - Administrator, Civil Service (Medical): No    Lack of Transportation (Non-Medical): No  Physical Activity: Inactive (07/27/2019)   Exercise Vital Sign    Days of Exercise per Week: 0 days    Minutes of Exercise per Session: 0 min  Stress: No Stress Concern Present (07/27/2019)   [employer] of Occupational Health - Occupational Stress Questionnaire    Feeling of Stress : Not at all  Social Connections: Unknown (07/27/2019)   Social Connection and Isolation Panel [NHANES]    Frequency of Communication with Friends and Family: More than three times a week    Frequency of Social Gatherings with Friends and Family: More than three times a week    Attends Religious Services: More than 4 times per year    Active Member of [employer] or Organizations: Yes    Attends Banker Meetings: More than 4 times per year    Marital Status: Not on file  Intimate Partner Violence: Not At Risk (07/26/2022)   Humiliation, Afraid, Rape, and Kick questionnaire    Fear of Current or Ex-Partner: No    Emotionally Abused: No    Physically Abused: No    Sexually Abused: No     Review of Systems   Gen: Denies any fever, chills, fatigue, weight loss, lack of appetite.   CV: Denies chest pain, heart palpitations, peripheral edema, syncope.  Resp: Denies shortness of breath at rest or with exertion. Denies wheezing or cough.  GI: see HPI GU : Denies urinary burning, urinary frequency, urinary hesitancy MS: Denies joint pain, muscle weakness, cramps, or limitation of movement.  Derm: Denies rash, itching, dry skin Psych: Denies depression, anxiety, memory loss, and confusion Heme: Denies bruising, bleeding, and enlarged lymph nodes.   Physical Exam   BP (!) 155/84   Pulse 98   Temp (!) 97.1 F (36.2 C) (Oral)   Ht 5\' 7"  (1.702 m)   Wt 150 lb 3.2 oz (68.1 kg)   SpO2 91%   BMI 23.52 kg/m  General:   Alert and oriented. Pleasant and cooperative. Well-nourished and well-developed.  Head:  Normocephalic and atraumatic. Eyes:  Without icterus Cardiac: possible soft systolic murmur Abdomen:  +BS, soft, non-tender and non-distended. No HSM noted. No guarding or rebound. No masses appreciated.  Rectal:  Deferred  Msk:  Symmetrical without gross deformities. Normal posture. Extremities:  Without edema. Neurologic:  Alert and  oriented x4;  grossly normal neurologically. Skin:  Intact without significant lesions or rashes. Psych:  Alert and cooperative. Normal mood and affect.   Assessment  Colin Wagner is a 72 y.o. 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 EGD completed during admission with moderately severe erosive esophagitis with bleeding, s/p biopsy negative for yeast. Here for follow-up to discuss EGD with dilation.   Dysphagia: unable to advance diet beyond  liquids/full liquids. Dysphagia has been noted with anything of more texture. No odynophagia. Continues PPI BID.   Will hold Plavix X 5 days as previously and pursue EGD/dilation expeditiously as per prior outlined plan.   The patient was found to have elevated blood pressure when vital signs were checked in the office. The blood pressure was rechecked by the nursing staff, and it was found to be persistently elevated >140/90 mmHg. I personally advised the patient to follow up closely with the PCP for hypertension control.    PLAN    Proceed with upper endoscopy/dilation in near future: the risks, benefits, and alternatives have been discussed with the patient in detail. The patient states understanding and desires to proceed.  Hold Plavix 5 days prior Continue PPI BID   Gelene Mink, PhD, [facility], [facility]

## 2022-08-26 NOTE — Patient Instructions (Signed)
We are arranging an upper endoscopy with dilation as soon as possible.  Continue pantoprazole twice a day.   Please stop Plavix 5 days prior!  Please follow-up with primary care regarding blood pressure.   I enjoyed seeing you again today! As you know, I value our relationship and want to provide genuine, compassionate, and quality care. I welcome your feedback. If you receive a survey regarding your visit,  I greatly appreciate you taking time to fill this out. See you next time!  Annitta Needs, PhD, ANP-BC [facility]

## 2022-08-26 NOTE — Progress Notes (Signed)
Gastroenterology Office Note     Primary Care Physician:  [name], MD  Primary Gastroenterologist: Dr. [name]    Chief Complaint   Chief Complaint  Patient presents with   Follow-up    Still having issues with dysphagia, unable to eat any solid foods.     History of Present Illness   Colin Wagner is a 72 y.o. 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 EGD completed during admission with moderately severe erosive esophagitis with bleeding, s/p biopsy negative for yeast. Here for follow-up to discuss EGD with dilation.     Mylanta as needed. No abdominal pain. Taking PPI BID. No N/V. Only able to tolerate apple sauce, pudding, protein, yogurt. Dysphagia with anything more texturized. Has to puree foods. NO overt GI bleeding.    Past Medical History:  Diagnosis Date   Alcohol abuse    Alcoholic pancreatitis 2035   admission   Chronic back pain    Chronic pancreatitis ([place])    based on ct findings 2016   COPD (chronic obstructive pulmonary disease) ([place])    Diabetes mellitus    type 2   Diverticulosis    Gastritis    GERD (gastroesophageal reflux disease)    Headache    History of radiation therapy 05/12/17- 06/22/17   Left cheek and bilateral neck/ 60 Gy in 30 fractions to gross disease   Hyperlipidemia    Hypertension    Jaw cancer ([place])    left jaw part of jaw bone removed   Peptic ulcer disease 1999   Per medical reports, no H pylori   Pneumonia    Renal cancer, left ([place]) 2012   he tells me that he has been released, ?and that he is free of cancer, and never had it  to begin with.    Right shoulder pain    Testicular hypofunction     Past Surgical History:  Procedure Laterality Date   BALLOON DILATION  12/31/2021   Procedure: BALLOON DILATION;  Surgeon: [name], MD;  [facility];  Service: Endoscopy;;   BIOPSY  04/30/2022   Procedure: BIOPSY;  Surgeon: Maryjane Hurter, MD;  [facility];  Service: Pulmonary;;   CAROTID STENT  06/2018   COLONOSCOPY  2003   Dr. [name], polyps   COLONOSCOPY  2005   Dr. [name], multiple diverticula   COLONOSCOPY  2008   Dr. [name], diverticulosis   COLONOSCOPY WITH PROPOFOL N/A 10/21/2016   Dr. [name]: Diverticulosis, two 5-7 mm polyps removed. path-tubular adenomas.  Next colonoscopy in 5 years.   COLONOSCOPY WITH PROPOFOL N/A 12/31/2021   Procedure: COLONOSCOPY WITH PROPOFOL;  Surgeon: [name], MD;  [facility];  Service: Endoscopy;  Laterality: N/A;  8:15am   ENDOBRONCHIAL ULTRASOUND Right 04/30/2022   Procedure: ENDOBRONCHIAL ULTRASOUND;  Surgeon: Maryjane Hurter, MD;  [facility];  Service: Pulmonary;  Laterality: Right;   ESOPHAGEAL BRUSHING  07/26/2022   Procedure: ESOPHAGEAL BRUSHING;  Surgeon: [name], DO;  [facility];  Service: Endoscopy;;   ESOPHAGEAL DILATION N/A 07/26/2022  Procedure: ESOPHAGEAL DILATION;  Surgeon: [name], DO;  [facility];  Service: Endoscopy;  Laterality: N/A;   ESOPHAGOGASTRODUODENOSCOPY     Multiple EGDs. 1999 EGD showed gastric ulcers, no H pylori and benign biopsies performed by Dr. [name]. 2001 gastric ulcer healed. Last EGD 2005 had gastritis.   ESOPHAGOGASTRODUODENOSCOPY (EGD) WITH PROPOFOL N/A 12/31/2021   narrowing/deformity at [facility] s/p baloon dilation   ESOPHAGOGASTRODUODENOSCOPY (EGD) WITH PROPOFOL N/A 07/26/2022   Procedure: ESOPHAGOGASTRODUODENOSCOPY (EGD) WITH PROPOFOL;  Surgeon: [name], DO;  [facility];  Service: Endoscopy;   Laterality: N/A;   FINE NEEDLE ASPIRATION  04/30/2022   Procedure: FINE NEEDLE ASPIRATION (FNA) LINEAR;  Surgeon: Maryjane Hurter, MD;  [facility];  Service: Pulmonary;;   IR GASTROSTOMY TUBE REMOVAL  02/08/2020   IR IMAGING GUIDED PORT INSERTION  05/26/2022   IR REPLACE G-TUBE SIMPLE WO FLUORO  03/20/2019   IR REPLACE G-TUBE SIMPLE WO FLUORO  09/21/2019   IR REPLC GASTRO/COLONIC TUBE PERCUT W/FLUORO  05/16/2018   Left partial mandibulectomy, Scapular free flap reconstruction, selective neck dissection, tracheotomy, and resection of intraoral palate cancer. Left 03/01/2017   [facility]   LUNG BIOPSY     MOUTH SURGERY     PARTIAL NEPHRECTOMY Left 2012   POLYPECTOMY  10/21/2016   Procedure: POLYPECTOMY;  Surgeon: [name], MD;  [facility];  Service: Endoscopy;;  hepatic flexure x2   POLYPECTOMY  12/31/2021   Procedure: POLYPECTOMY INTESTINAL;  Surgeon: [name], MD;  [facility];  Service: Endoscopy;;   SURGERY OF LIP  06/2019   lip revision   TRANSURETHRAL RESECTION OF PROSTATE N/A 03/01/2019   Procedure: TRANSURETHRAL RESECTION OF THE PROSTATE (TURP)WITH CYSTOSCOPY;  Surgeon: Raynelle Bring, MD;  [facility];  Service: Urology;  Laterality: N/A;   VIDEO BRONCHOSCOPY Right 04/30/2022   Procedure: VIDEO BRONCHOSCOPY WITHOUT FLUORO;  Surgeon: Maryjane Hurter, MD;  [facility];  Service: Pulmonary;  Laterality: Right;    Current Outpatient Medications  Medication Sig Dispense Refill   acetaminophen (TYLENOL) 500 MG tablet Take 1,000 mg by mouth every 6 (six) hours as needed for mild pain or moderate pain.     albuterol (VENTOLIN HFA) 108 (90 Base) MCG/ACT inhaler Inhale 2 puffs into the lungs every 4 (four) hours as needed for wheezing or shortness of breath.     amLODipine (NORVASC) 10 MG tablet Take 1 tablet (10 mg total) by mouth daily. 30 tablet 5   aspirin EC 81 MG tablet Take 1 tablet (81  mg total) by mouth daily with breakfast. 30 tablet 11   atorvastatin (LIPITOR) 40 MG tablet Take 1 tablet (40 mg total) by mouth daily. 30 tablet 5   cholecalciferol (VITAMIN D) 25 MCG (1000 UNIT) tablet Take 1,000 Units by mouth daily.     clopidogrel (PLAVIX) 75 MG tablet Take 1 tablet (75 mg total) by mouth daily. 30 tablet 5   ferrous sulfate 325 (65 FE) MG tablet Take 1 tablet (325 mg total) by mouth daily with breakfast. 30 tablet 3   fluticasone (FLONASE) 50 MCG/ACT nasal spray Place 2 sprays into both nostrils daily.     HYDROcodone-acetaminophen (NORCO) 7.5-325 MG tablet Take 1 tablet by mouth every 6 (six) hours as needed for moderate pain.     LEVEMIR FLEXTOUCH 100 UNIT/ML FlexTouch Pen Inject 18 Units into the skin at bedtime. (Patient taking differently: Inject 15 Units into the skin  at bedtime.) 15 mL 11   levETIRAcetam (KEPPRA) 100 MG/ML solution Take 7.5 mL (750mg ) twice a day 450 mL 12   LORazepam (ATIVAN) 1 MG tablet Take 1 mg by mouth every 6 (six) hours as needed for anxiety or sleep.  5   megestrol (MEGACE) 40 MG/ML suspension Take 10 mLs (400 mg total) by mouth daily. 480 mL 3   metoCLOPramide (REGLAN) 10 MG tablet Take 1 tablet (10 mg total) by mouth every 6 (six) hours as needed (Hiccups.). 20 tablet 1   metoprolol succinate (TOPROL XL) 100 MG 24 hr tablet Take 1 tablet (100 mg total) by mouth daily. Take with or immediately following a meal. 30 tablet 5   nystatin cream (MYCOSTATIN) Apply to affected area 2 times daily 80 g 0   OVER THE COUNTER MEDICATION Take 10 mLs by mouth 2 (two) times daily. maalox     pantoprazole (PROTONIX) 40 MG tablet Take 1 tablet (40 mg total) by mouth 2 (two) times daily. 60 tablet 3   prochlorperazine (COMPAZINE) 10 MG tablet TAKE 1 TABLET(10 MG) BY MOUTH EVERY 6 HOURS AS NEEDED FOR NAUSEA OR VOMITING (Patient taking differently: Take 10 mg by mouth every 6 (six) hours as needed for vomiting or nausea.) 30 tablet 2   senna (SENOKOT) 8.6 MG TABS  tablet Take 1 tablet (8.6 mg total) by mouth at bedtime as needed for mild constipation. May be needed as pain medication can constipate. 120 each 1   sildenafil (VIAGRA) 100 MG tablet Take 100 mg by mouth daily as needed for erectile dysfunction.     sucralfate (CARAFATE) 1 g tablet Take 1 tablet (1 g total) by mouth 4 (four) times daily -  with meals and at bedtime. May dissolve 1 tab and glass of water and drink as needed 120 tablet 1   testosterone cypionate (DEPOTESTOSTERONE CYPIONATE) 200 MG/ML injection Inject 1.5 mLs into the muscle every 14 (fourteen) days.     tiZANidine (ZANAFLEX) 4 MG tablet Take 4 mg by mouth every 6 (six) hours as needed for muscle spasms.     umeclidinium-vilanterol (ANORO ELLIPTA) 62.5-25 MCG/ACT AEPB Inhale 1 puff into the lungs daily. 60 each 5   No current facility-administered medications for this visit.    Allergies as of 08/26/2022 - Review Complete 08/26/2022  Allergen Reaction Noted   Trazodone Other (See Comments) 07/25/2022    Family History  Problem Relation Age of Onset   Hypertension Mother    Colon cancer Neg Hx     Social History   Socioeconomic History   Marital status: Divorced    Spouse name: Not on file   Number of children: 0   Years of education: Not on file   Highest education level: Not on file  Occupational History   Not on file  Tobacco Use   Smoking status: Former    Packs/day: 0.50    Years: 35.00    Total pack years: 17.50    Types: Cigarettes    Quit date: 03/01/2017    Years since quitting: 5.4   Smokeless tobacco: Never   Tobacco comments:    quit 02-2017  Vaping Use   Vaping Use: Never used  Substance and Sexual Activity   Alcohol use: No    Comment: quit 2000 but relapse in 2016. no etoh since hospitalized 2016.    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   Patient with  a history of smoking half pack a day for 35+ years. Patient currently only smoking a few cigarettes per day.   The patient has history of alcohol abuse. Patient quit in 2000 and then relapsed in 2016. Patient has not had any alcohol since 2016.   Right handed    Lives at home with fiancee    Social Determinants of Health   Financial Resource Strain: High Risk (06/03/2022)   Overall Financial Resource Strain (CARDIA)    Difficulty of Paying Living Expenses: Very hard  Food Insecurity: No Food Insecurity (07/26/2022)   Hunger Vital Sign    Worried About Running Out of Food in the Last Year: Never true    Ran Out of Food in the Last Year: Never true  Transportation Needs: No Transportation Needs (07/26/2022)   PRAPARE - Hydrologist (Medical): No    Lack of Transportation (Non-Medical): No  Physical Activity: Inactive (07/27/2019)   Exercise Vital Sign    Days of Exercise per Week: 0 days    Minutes of Exercise per Session: 0 min  Stress: No Stress Concern Present (07/27/2019)   [place]    Feeling of Stress : Not at all  Social Connections: Unknown (07/27/2019)   Social Connection and Isolation Panel [NHANES]    Frequency of Communication with Friends and Family: More than three times a week    Frequency of Social Gatherings with Friends and Family: More than three times a week    Attends Religious Services: More than 4 times per year    Active Member of [employer] or Organizations: Yes    Attends Archivist Meetings: More than 4 times per year    Marital Status: Not on file  Intimate Partner Violence: Not At Risk (07/26/2022)   Humiliation, Afraid, Rape, and Kick questionnaire    Fear of Current or Ex-Partner: No    Emotionally Abused: No    Physically Abused: No    Sexually Abused: No     Review of Systems   Gen: Denies any fever, chills, fatigue, weight loss, lack of appetite.   CV: Denies chest pain, heart palpitations, peripheral edema, syncope.  Resp: Denies shortness of breath at rest or with exertion. Denies wheezing or cough.  GI: see HPI GU : Denies urinary burning, urinary frequency, urinary hesitancy MS: Denies joint pain, muscle weakness, cramps, or limitation of movement.  Derm: Denies rash, itching, dry skin Psych: Denies depression, anxiety, memory loss, and confusion Heme: Denies bruising, bleeding, and enlarged lymph nodes.   Physical Exam   BP (!) 155/84   Pulse 98   Temp (!) 97.1 F (36.2 C) (Oral)   Ht 5\' 7"  (1.702 m)   Wt 150 lb 3.2 oz (68.1 kg)   SpO2 91%   BMI 23.52 kg/m  General:   Alert and oriented. Pleasant and cooperative. Well-nourished and well-developed.  Head:  Normocephalic and atraumatic. Eyes:  Without icterus Cardiac: possible soft systolic murmur Abdomen:  +BS, soft, non-tender and non-distended. No HSM noted. No guarding or rebound. No masses appreciated.  Rectal:  Deferred  Msk:  Symmetrical without gross deformities. Normal posture. Extremities:  Without edema. Neurologic:  Alert and  oriented x4;  grossly normal neurologically. Skin:  Intact without significant lesions or rashes. Psych:  Alert and cooperative. Normal mood and affect.   Assessment  Colin Wagner is a 72 y.o. 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 EGD completed during admission with moderately severe erosive esophagitis with bleeding, s/p biopsy negative for yeast. Here for follow-up to discuss EGD with dilation.   Dysphagia: unable to advance diet beyond  liquids/full liquids. Dysphagia has been noted with anything of more texture. No odynophagia. Continues PPI BID.   Will hold Plavix X 5 days as previously and pursue EGD/dilation expeditiously as per prior outlined plan.   The patient was found to have elevated blood pressure when vital signs were checked in the office. The blood pressure was rechecked by the nursing staff, and it was found to be persistently elevated >140/90 mmHg. I personally advised the patient to follow up closely with the PCP for hypertension control.    PLAN    Proceed with upper endoscopy/dilation in near future: the risks, benefits, and alternatives have been discussed with the patient in detail. The patient states understanding and desires to proceed.  Hold Plavix 5 days prior Continue PPI BID   Annitta Needs, PhD, [facility]

## 2022-08-27 ENCOUNTER — Other Ambulatory Visit: Payer: Self-pay | Admitting: Radiology

## 2022-08-27 DIAGNOSIS — R1310 Dysphagia, unspecified: Secondary | ICD-10-CM

## 2022-08-30 ENCOUNTER — Encounter (HOSPITAL_COMMUNITY): Payer: Self-pay

## 2022-08-30 ENCOUNTER — Telehealth: Payer: Self-pay | Admitting: Internal Medicine

## 2022-08-30 ENCOUNTER — Ambulatory Visit (HOSPITAL_COMMUNITY)
Admission: RE | Admit: 2022-08-30 | Discharge: 2022-08-30 | Disposition: A | Discharge status: Home / Self Care | Payer: Medicare Other | Source: Ambulatory Visit | Attending: Physician Assistant

## 2022-08-30 ENCOUNTER — Ambulatory Visit: Payer: Medicare Other

## 2022-08-30 ENCOUNTER — Other Ambulatory Visit: Payer: Self-pay | Admitting: Physician Assistant

## 2022-08-30 ENCOUNTER — Other Ambulatory Visit: Payer: Self-pay

## 2022-08-30 DIAGNOSIS — E119 Type 2 diabetes mellitus without complications: Secondary | ICD-10-CM | POA: Diagnosis not present

## 2022-08-30 DIAGNOSIS — R569 Unspecified convulsions: Secondary | ICD-10-CM | POA: Diagnosis not present

## 2022-08-30 DIAGNOSIS — K219 Gastro-esophageal reflux disease without esophagitis: Secondary | ICD-10-CM | POA: Diagnosis not present

## 2022-08-30 DIAGNOSIS — C3490 Malignant neoplasm of unspecified part of unspecified bronchus or lung: Secondary | ICD-10-CM | POA: Insufficient documentation

## 2022-08-30 DIAGNOSIS — I10 Essential (primary) hypertension: Secondary | ICD-10-CM | POA: Insufficient documentation

## 2022-08-30 DIAGNOSIS — R634 Abnormal weight loss: Secondary | ICD-10-CM | POA: Diagnosis not present

## 2022-08-30 DIAGNOSIS — T17320A Food in larynx causing asphyxiation, initial encounter: Secondary | ICD-10-CM | POA: Diagnosis present

## 2022-08-30 DIAGNOSIS — K209 Esophagitis, unspecified without bleeding: Secondary | ICD-10-CM | POA: Insufficient documentation

## 2022-08-30 DIAGNOSIS — R627 Adult failure to thrive: Secondary | ICD-10-CM | POA: Insufficient documentation

## 2022-08-30 DIAGNOSIS — E785 Hyperlipidemia, unspecified: Secondary | ICD-10-CM | POA: Diagnosis not present

## 2022-08-30 DIAGNOSIS — R1310 Dysphagia, unspecified: Secondary | ICD-10-CM | POA: Diagnosis not present

## 2022-08-30 DIAGNOSIS — Z85830 Personal history of malignant neoplasm of bone: Secondary | ICD-10-CM | POA: Diagnosis not present

## 2022-08-30 DIAGNOSIS — Z794 Long term (current) use of insulin: Secondary | ICD-10-CM | POA: Insufficient documentation

## 2022-08-30 DIAGNOSIS — J449 Chronic obstructive pulmonary disease, unspecified: Secondary | ICD-10-CM | POA: Diagnosis not present

## 2022-08-30 DIAGNOSIS — W44F3XA Food entering into or through a natural orifice, initial encounter: Secondary | ICD-10-CM | POA: Insufficient documentation

## 2022-08-30 HISTORY — PX: IR GASTROSTOMY TUBE MOD SED: IMG625

## 2022-08-30 LAB — CBC WITH DIFFERENTIAL/PLATELET
Abs Immature Granulocytes: 0.03 10*3/uL (ref 0.00–0.07)
Basophils Absolute: 0 10*3/uL (ref 0.0–0.1)
Basophils Relative: 0 %
Eosinophils Absolute: 0.1 10*3/uL (ref 0.0–0.5)
Eosinophils Relative: 1 %
HCT: 34 % — ABNORMAL LOW (ref 39.0–52.0)
Hemoglobin: 10.6 g/dL — ABNORMAL LOW (ref 13.0–17.0)
Immature Granulocytes: 0 %
Lymphocytes Relative: 2 %
Lymphs Abs: 0.3 10*3/uL — ABNORMAL LOW (ref 0.7–4.0)
MCH: 27.3 pg (ref 26.0–34.0)
MCHC: 31.2 g/dL (ref 30.0–36.0)
MCV: 87.6 fL (ref 80.0–100.0)
Monocytes Absolute: 0.6 10*3/uL (ref 0.1–1.0)
Monocytes Relative: 6 %
Neutro Abs: 9.7 10*3/uL — ABNORMAL HIGH (ref 1.7–7.7)
Neutrophils Relative %: 91 %
Platelets: 291 10*3/uL (ref 150–400)
RBC: 3.88 MIL/uL — ABNORMAL LOW (ref 4.22–5.81)
RDW: 21.3 % — ABNORMAL HIGH (ref 11.5–15.5)
WBC: 10.7 10*3/uL — ABNORMAL HIGH (ref 4.0–10.5)
nRBC: 0 % (ref 0.0–0.2)

## 2022-08-30 LAB — BASIC METABOLIC PANEL
Anion gap: 14 (ref 5–15)
BUN: 63 mg/dL — ABNORMAL HIGH (ref 8–23)
CO2: 27 mmol/L (ref 22–32)
Calcium: 9.9 mg/dL (ref 8.9–10.3)
Chloride: 91 mmol/L — ABNORMAL LOW (ref 98–111)
Creatinine, Ser: 2.39 mg/dL — ABNORMAL HIGH (ref 0.61–1.24)
GFR, Estimated: 28 mL/min — ABNORMAL LOW (ref >60)
Glucose, Bld: 315 mg/dL — ABNORMAL HIGH (ref 70–99)
Potassium: 4.9 mmol/L (ref 3.5–5.1)
Sodium: 132 mmol/L — ABNORMAL LOW (ref 135–145)

## 2022-08-30 LAB — PROTIME-INR
INR: 1.4 — ABNORMAL HIGH (ref 0.8–1.2)
Prothrombin Time: 17 seconds — ABNORMAL HIGH (ref 11.4–15.2)

## 2022-08-30 LAB — GLUCOSE, CAPILLARY
Glucose-Capillary: 294 mg/dL — ABNORMAL HIGH (ref 70–99)
Glucose-Capillary: 307 mg/dL — ABNORMAL HIGH (ref 70–99)

## 2022-08-30 MED ORDER — LIDOCAINE HCL 1 % IJ SOLN
INTRAMUSCULAR | Status: AC
  Administered 2022-08-30: 9 mL via INTRADERMAL
  Filled 2022-08-30: qty 20

## 2022-08-30 MED ORDER — HYDROMORPHONE HCL 1 MG/ML IJ SOLN: 1.0000 mg | INTRAMUSCULAR | Status: DC | PRN

## 2022-08-30 MED ORDER — ONDANSETRON HCL 4 MG/2ML IJ SOLN: 4.0000 mg | INTRAMUSCULAR | Status: DC | PRN

## 2022-08-30 MED ORDER — GLUCAGON HCL RDNA (DIAGNOSTIC) 1 MG IJ SOLR
INTRAMUSCULAR | Status: AC
  Filled 2022-08-30: qty 1

## 2022-08-30 MED ORDER — HEPARIN SOD (PORK) LOCK FLUSH 100 UNIT/ML IV SOLN
500.0000 [IU] | Freq: Once | INTRAVENOUS | Status: AC
  Administered 2022-08-30: 500 [IU] via INTRAVENOUS
  Filled 2022-08-30: qty 5

## 2022-08-30 MED ORDER — GLUCERNA 1.5 CAL PO LIQD: ORAL | 2 refills | Status: DC

## 2022-08-30 MED ORDER — MIDAZOLAM HCL 2 MG/2ML IJ SOLN
INTRAMUSCULAR | Status: AC
  Filled 2022-08-30: qty 2

## 2022-08-30 MED ORDER — FENTANYL CITRATE (PF) 100 MCG/2ML IJ SOLN
INTRAMUSCULAR | Status: AC | PRN
  Administered 2022-08-30 (×4): 25 ug via INTRAVENOUS

## 2022-08-30 MED ORDER — FENTANYL CITRATE (PF) 100 MCG/2ML IJ SOLN
INTRAMUSCULAR | Status: AC
  Filled 2022-08-30: qty 2

## 2022-08-30 MED ORDER — CEFAZOLIN SODIUM-DEXTROSE 2-4 GM/100ML-% IV SOLN
2.0000 g | INTRAVENOUS | Status: AC
  Administered 2022-08-30: 2 g via INTRAVENOUS
  Filled 2022-08-30: qty 100

## 2022-08-30 MED ORDER — GLUCAGON HCL RDNA (DIAGNOSTIC) 1 MG IJ SOLR
INTRAMUSCULAR | Status: AC | PRN
  Administered 2022-08-30: 1 mg via INTRAVENOUS

## 2022-08-30 MED ORDER — MIDAZOLAM HCL 2 MG/2ML IJ SOLN
INTRAMUSCULAR | Status: AC | PRN
  Administered 2022-08-30 (×2): .5 mg via INTRAVENOUS
  Administered 2022-08-30: 1 mg via INTRAVENOUS
  Administered 2022-08-30 (×2): .5 mg via INTRAVENOUS

## 2022-08-30 MED ORDER — LIDOCAINE VISCOUS HCL 2 % MT SOLN
OROMUCOSAL | Status: AC
  Administered 2022-08-30: 2 mL via ORAL
  Filled 2022-08-30: qty 15

## 2022-08-30 MED ORDER — GELATIN ABSORBABLE 12-7 MM EX MISC
CUTANEOUS | Status: AC
  Filled 2022-08-30: qty 1

## 2022-08-30 MED ORDER — HYDROCODONE-ACETAMINOPHEN 5-325 MG PO TABS: 1.0000 | ORAL_TABLET | ORAL | Status: DC | PRN

## 2022-08-30 MED ORDER — IOHEXOL 300 MG/ML  SOLN
50.0000 mL | Freq: Once | INTRAMUSCULAR | Status: AC | PRN
  Administered 2022-08-30: 10 mL

## 2022-08-30 MED ORDER — ONDANSETRON HCL 4 MG/2ML IJ SOLN
INTRAMUSCULAR | Status: AC
  Filled 2022-08-30: qty 2

## 2022-08-30 MED ORDER — SODIUM CHLORIDE 0.9 % IV SOLN: INTRAVENOUS | Status: DC

## 2022-08-30 NOTE — Procedures (Signed)
Interventional Radiology Procedure Note  Procedure: Placement of percutaneous 75F pull-through gastrostomy tube. Complications: None Recommendations: - NPO except for sips and chips remainder of today and overnight - Maintain G-tube to LWS until tomorrow morning  - May advance diet as tolerated and begin using tube tomorrow morning  Signed,  Criselda Peaches, MD

## 2022-08-30 NOTE — H&P (Signed)
Referring Physician(s): Mohamed,M  Supervising Physician: Jacqulynn Cadet  Patient Status:  [facility]  Chief Complaint:  "I'm getting a feeding tube"  Subjective: Patient familiar to IR service from gastrostomy tube exchanges in 2019, 2000 and 2021 with tube removal on 02/08/2020 as well as Port-A-Cath placement on 05/26/2022.  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  Additional past medical history pertinent for alcohol abuse ,chronic alcoholic pancreatitis, chronic back pain, carotid artery disease, COPD, diabetes, seizures, GERD, PUD, hyperlipidemia, hypertension, ? left renal cancer.  He presents now with esophagitis, weight loss, diminished appetite, dysphagia, failure to thrive and is scheduled today for placement of gastrostomy tube.  He currently denies fever, headache, chest pain, worsening dyspnea, cough, nausea, vomiting or bleeding.  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    Right shoulder pain    Testicular hypofunction    Past Surgical History:  Procedure Laterality Date   BALLOON DILATION  12/31/2021   Procedure: BALLOON  DILATION;  Surgeon: [name], MD;  [facility];  Service: Endoscopy;;   BIOPSY  04/30/2022   Procedure: BIOPSY;  Surgeon: Maryjane Hurter, MD;  [facility];  Service: Pulmonary;;   CAROTID STENT  06/2018   COLONOSCOPY  2003   Dr. [name], polyps   COLONOSCOPY  2005   Dr. [name], multiple diverticula   COLONOSCOPY  2008   Dr. [name], diverticulosis   COLONOSCOPY WITH PROPOFOL N/A 10/21/2016   Dr. [name]: Diverticulosis, two 5-7 mm polyps removed. path-tubular adenomas.  Next colonoscopy in 5 years.   COLONOSCOPY WITH PROPOFOL N/A 12/31/2021   Procedure: COLONOSCOPY WITH PROPOFOL;  Surgeon: [name], MD;  [facility];  Service: Endoscopy;  Laterality: N/A;  8:15am   ENDOBRONCHIAL ULTRASOUND Right 04/30/2022   Procedure: ENDOBRONCHIAL ULTRASOUND;  Surgeon: Maryjane Hurter, MD;  [facility];  Service: Pulmonary;  Laterality: Right;   ESOPHAGEAL BRUSHING  07/26/2022   Procedure: ESOPHAGEAL BRUSHING;  Surgeon: [name], DO;  [facility];  Service: Endoscopy;;   ESOPHAGEAL DILATION N/A 07/26/2022   Procedure: ESOPHAGEAL DILATION;  Surgeon: [name], DO;  [facility];  Service: Endoscopy;  Laterality: N/A;   ESOPHAGOGASTRODUODENOSCOPY     Multiple EGDs. 1999 EGD showed gastric ulcers, no H pylori and benign biopsies performed by Dr. [name]. 2001 gastric ulcer healed. Last EGD 2005 had gastritis.  ESOPHAGOGASTRODUODENOSCOPY (EGD) WITH PROPOFOL N/A 12/31/2021   narrowing/deformity at UES s/p baloon dilation   ESOPHAGOGASTRODUODENOSCOPY (EGD) WITH PROPOFOL N/A 07/26/2022   Procedure: ESOPHAGOGASTRODUODENOSCOPY (EGD) WITH PROPOFOL;  Surgeon: [name], DO;  [facility];  Service: Endoscopy;  Laterality: N/A;   FINE NEEDLE ASPIRATION  04/30/2022   Procedure: FINE NEEDLE ASPIRATION (FNA) LINEAR;  Surgeon: Maryjane Hurter, MD;  [facility];  Service:  Pulmonary;;   IR GASTROSTOMY TUBE REMOVAL  02/08/2020   IR IMAGING GUIDED PORT INSERTION  05/26/2022   IR REPLACE G-TUBE SIMPLE WO FLUORO  03/20/2019   IR REPLACE G-TUBE SIMPLE WO FLUORO  09/21/2019   IR REPLC GASTRO/COLONIC TUBE PERCUT W/FLUORO  05/16/2018   Left partial mandibulectomy, Scapular free flap reconstruction, selective neck dissection, tracheotomy, and resection of intraoral palate cancer. Left 03/01/2017   [facility]   LUNG BIOPSY     MOUTH SURGERY     PARTIAL NEPHRECTOMY Left 2012   POLYPECTOMY  10/21/2016   Procedure: POLYPECTOMY;  Surgeon: [name], MD;  [facility];  Service: Endoscopy;;  hepatic flexure x2   POLYPECTOMY  12/31/2021   Procedure: POLYPECTOMY INTESTINAL;  Surgeon: [name], MD;  [facility];  Service: Endoscopy;;   SURGERY OF LIP  06/2019   lip revision   TRANSURETHRAL RESECTION OF PROSTATE N/A 03/01/2019   Procedure: TRANSURETHRAL RESECTION OF THE PROSTATE (TURP)WITH CYSTOSCOPY;  Surgeon: Raynelle Bring, MD;  [facility];  Service: Urology;  Laterality: N/A;   VIDEO BRONCHOSCOPY Right 04/30/2022   Procedure: VIDEO BRONCHOSCOPY WITHOUT FLUORO;  Surgeon: Maryjane Hurter, MD;  [facility];  Service: Pulmonary;  Laterality: Right;      Allergies: Trazodone  Medications: Prior to Admission medications   Medication Sig Start Date End Date Taking? Authorizing Provider  acetaminophen (TYLENOL) 500 MG tablet Take 1,000 mg by mouth every 6 (six) hours as needed for mild pain or moderate pain.    [provider]  albuterol (VENTOLIN HFA) 108 (90 Base) MCG/ACT inhaler Inhale 2 puffs into the lungs every 4 (four) hours as needed for wheezing or shortness of breath. 03/29/22   [provider]  amLODipine (NORVASC) 10 MG tablet Take 1 tablet (10 mg total) by mouth daily. 07/27/22   Roxan Hockey, MD  aspirin EC 81 MG tablet Take 1 tablet (81 mg total) by mouth daily  with breakfast. 07/27/22   Denton Brick, Courage, MD  atorvastatin (LIPITOR) 40 MG tablet Take 1 tablet (40 mg total) by mouth daily. 07/27/22   Roxan Hockey, MD  cholecalciferol (VITAMIN D) 25 MCG (1000 UNIT) tablet Take 1,000 Units by mouth daily.    [provider]  clopidogrel (PLAVIX) 75 MG tablet Take 1 tablet (75 mg total) by mouth daily. 07/27/22   Roxan Hockey, MD  ferrous sulfate 325 (65 FE) MG tablet Take 1 tablet (325 mg total) by mouth daily with breakfast. 07/27/22   Emokpae, Courage, MD  fluticasone (FLONASE) 50 MCG/ACT nasal spray Place 2 sprays into both nostrils daily.    [provider]  HYDROcodone-acetaminophen (NORCO) 7.5-325 MG tablet Take 1 tablet by mouth every 6 (six) hours as needed for moderate pain. 06/30/22   [provider]  LEVEMIR FLEXTOUCH 100 UNIT/ML FlexTouch Pen Inject 18 Units into the skin at bedtime. Patient taking differently: Inject 15 Units into the skin at bedtime. 07/27/22   Roxan Hockey, MD  levETIRAcetam (KEPPRA) 100 MG/ML solution Take 7.5 mL (750mg ) twice  a day 08/24/22   Cameron Sprang, MD  LORazepam (ATIVAN) 1 MG tablet Take 1 mg by mouth every 6 (six) hours as needed for anxiety or sleep. 03/31/15   [provider]  megestrol (MEGACE) 40 MG/ML suspension Take 10 mLs (400 mg total) by mouth daily. 07/27/22   Roxan Hockey, MD  metoCLOPramide (REGLAN) 10 MG tablet Take 1 tablet (10 mg total) by mouth every 6 (six) hours as needed (Hiccups.). 07/14/22 07/14/23  Eugenie Filler, MD  metoprolol succinate (TOPROL XL) 100 MG 24 hr tablet Take 1 tablet (100 mg total) by mouth daily. Take with or immediately following a meal. 07/27/22 07/27/23  Roxan Hockey, MD  nystatin cream (MYCOSTATIN) Apply to affected area 2 times daily 06/13/22   Volney American, PA-C  OVER THE COUNTER MEDICATION Take 10 mLs by mouth 2 (two) times daily. maalox    [provider]  pantoprazole (PROTONIX) 40 MG tablet Take 1  tablet (40 mg total) by mouth 2 (two) times daily. 07/27/22   Roxan Hockey, MD  prochlorperazine (COMPAZINE) 10 MG tablet TAKE 1 TABLET(10 MG) BY MOUTH EVERY 6 HOURS AS NEEDED FOR NAUSEA OR VOMITING Patient taking differently: Take 10 mg by mouth every 6 (six) hours as needed for vomiting or nausea. 07/15/22   Heilingoetter, Cassandra L, PA-C  senna (SENOKOT) 8.6 MG TABS tablet Take 1 tablet (8.6 mg total) by mouth at bedtime as needed for mild constipation. May be needed as pain medication can constipate. 01/12/17   [name], MD  sildenafil (VIAGRA) 100 MG tablet Take 100 mg by mouth daily as needed for erectile dysfunction.    [provider]  sucralfate (CARAFATE) 1 g tablet Take 1 tablet (1 g total) by mouth 4 (four) times daily -  with meals and at bedtime. May dissolve 1 tab and glass of water and drink as needed 07/27/22   Roxan Hockey, MD  testosterone cypionate (DEPOTESTOSTERONE CYPIONATE) 200 MG/ML injection Inject 1.5 mLs into the muscle every 14 (fourteen) days. 05/22/22   [provider]  tiZANidine (ZANAFLEX) 4 MG tablet Take 4 mg by mouth every 6 (six) hours as needed for muscle spasms. 06/30/22   [provider]  umeclidinium-vilanterol (ANORO ELLIPTA) 62.5-25 MCG/ACT AEPB Inhale 1 puff into the lungs daily. 07/27/22   Roxan Hockey, MD     Vital Signs: BP 105/66   Pulse 99   Temp 97.6 F (36.4 C) (Oral)   Resp 18   Ht 5\' 7"  (1.702 m)   Wt 150 lb 2.1 oz (68.1 kg)   SpO2 92%   BMI 23.51 kg/m   Physical Exam awake, answers simple questions ok, dysarthric, drooping of left lower side of mouth/chin; chest with distant breath sounds bilaterally.  Clean, intact left chest wall Port-A-Cath.  Heart with regular rate and rhythm.  Abdomen soft, positive bowel sounds, nontender.  No significant lower extremity edema.  Imaging: No results found.  Labs:  CBC: Recent Labs    07/27/22 0431 07/28/22 1442 08/10/22 0044 08/11/22 0819  WBC 11.3*  7.3 10.1 8.0  HGB 10.0* 10.1* 10.1* 10.5*  HCT 31.6* 31.0* 31.8* 32.5*  PLT 352 395 312 390    COAGS: Recent Labs    07/25/22 0738  INR 1.2  APTT 29    BMP: Recent Labs    07/27/22 0431 07/28/22 1442 08/10/22 0044 08/11/22 0819  NA 136 133* 141 141  K 4.4 4.7 5.2* 4.8  CL 100 98 104 104  CO2 26  25 28 28   GLUCOSE 191* 198* 180* 203*  BUN 20 28* 51* 52*  CALCIUM 9.6 9.7 10.0 10.3  CREATININE 1.45* 2.12* 1.97* 1.96*  GFRNONAA 51* 32* 35* 36*    LIVER FUNCTION TESTS: Recent Labs    07/25/22 0738 07/28/22 1442 08/10/22 0044 08/11/22 0819  BILITOT 0.1* 0.4 0.7 0.6  AST 55* 22 14* 16  ALT 23 15 12 13   ALKPHOS 87 86 84 79  PROT 8.2* 7.7 8.0 8.2*  ALBUMIN 3.8 3.2* 3.9 3.5    Assessment and Plan: Patient familiar to IR service from gastrostomy tube exchanges in 2019, 2000 and 2021 with tube removal on 02/08/2020 as well as Port-A-Cath placement on 05/26/2022.  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  Additional past medical history pertinent for alcohol abuse ,chronic alcoholic pancreatitis, chronic back pain, carotid artery disease, COPD, diabetes, seizures, GERD, PUD, hyperlipidemia, hypertension, ? left renal cancer.  He presents now with esophagitis, weight loss, diminished appetite, dysphagia, failure to thrive and is scheduled today for placement of gastrostomy tube. Risks and benefits image guided gastrostomy tube placement was discussed with the patient/significant other including, but not limited to the need for a barium enema during the procedure, bleeding, infection, peritonitis and/or damage to adjacent structures.  All of the patient's questions were answered, patient is agreeable to proceed.  Consent signed and in chart.  LABS PENDING  Electronically Signed: D. Rowe Robert, PA-C 08/30/2022, 8:07 AM   I spent a total of 20 minutes at the  the patient's bedside AND on the patient's hospital floor or unit, greater than 50% of which was counseling/coordinating care for percutaneous gastrostomy tube placement

## 2022-08-30 NOTE — Progress Notes (Signed)
Nutrition Follow-up:  Called to come to entrance of [facility] front door.  Patient needing tube feeding supplies and education.  New PEG placed today.  Patient and fiance waiting on transportation pick up.  Patient with non-small cell lung cancer. Receiving daurvalumab.    Noted 12/4 EGD with severe esophagitis with bleeding.  Patient with significant dysphagia.  Fiance reports that patient has been able to take bites of soft foods and drinking 2 shakes a day.  Weight has been decreasing.  Patient says that he had a feeding tube in 2021.  Fiance primarily did the feedings.      Medications: reviewed  Labs: Na 132, K 4.9, glucose 315, BUN 63, creatinine 2.39  Anthropometrics:   Weight 150 lb 2.1 oz today  153 lb 1.6 oz on 12/20 171 lb 15.7 oz on 12/3   Estimated Energy Needs  Kcals: 2082-2290 Protein: 104-118 g Fluid: > 2 L  NUTRITION DIAGNOSIS: Unintentional weight loss continues    INTERVENTION:  Patent is at refeeding risk.  Would recommend slow titration of formula and checking CMP, Magnesium and K.  Message sent to [place], [place]. Fiance says that she can't get patient back to cancer center until 1/12.  Recommend patient start with 3 cartons of glucerna 1.5 a day and not increase until labs checked and discussed further with RD.  Flush with 120 ml of water before and after each feeding.   Patient with new enfit tube.  Correct syringes given to patient and instructed on how to use syringes and give feeding.  Fiance familiar as she gave feedings in the past.   Written instructions given to patient on how to give feeding and water flush and clean around site.  Transportation waiting on patient.   Patient aware that he can't start feeding until 24 hrs after placement/per IR instructions. Patient given box of glucerna 1.5 and bag of supplies.   Ideally patient will need 6 cartons of glucerna 1.5 split over 3 feedings/day (2 cartons 3 times per day).  Flush tube with 116ml of  water 5x/day. Meets 100% of calorie needs. Provides 2136 calories, 117.6 g protein, 1080 ml free water (1968ml total water with flushes)  Patient receiving other DME equipment from [facility] and agreeable to receiving tube feeding supplies from them as well.  RD contacted Erasmo Downer, representative for [facility] with new referral.      MONITORING, EVALUATION, GOAL: tube feeding tolerance, labs, weight   NEXT VISIT: Friday, Jan 12 phone call, titrate feeding/refeeding labs  Martrice [address] Zenia Resides, [place], [place] Registered Dietitian [phone]

## 2022-08-30 NOTE — Discharge Instructions (Addendum)
Discharge Instructions:   Please call Interventional Radiology clinic [phone] with any questions or concerns.  You may remove your dressing and do a sponge bath tomorrow.  Keep the gastrostomy site covered for the first week then as instructed. Starting 08-31-22 Cleanse site gastrostomy with soap and water,dress with triple-antibiotic ointment, and cover with gauze for 7 days. Do not use scissors near the cathether    Cap the gastrostomy port. May start using gastrostomy tube tomorrow, 08-31-22 for feeding at 10 am Keep head of bed elevated 3 degree or higher. Flush gastrostomy tube with 20 cc of water after receiving medication or at least once a shift.   Moderate Conscious Sedation, Adult, Care After This sheet gives you information about how to care for yourself after your procedure. Your health care provider may also give you more specific instructions. If you have problems or questions, contact your health care provider. What can I expect after the procedure? After the procedure, it is common to have: Sleepiness for several hours. Impaired judgment for several hours. Difficulty with balance. Vomiting if you eat too soon. Follow these instructions at home: For the time period you were told by your health care provider: Rest. Do not participate in activities where you could fall or become injured. Do not drive or use machinery. Do not drink alcohol. Do not take sleeping pills or medicines that cause drowsiness. Do not make important decisions or sign legal documents. Do not take care of children on your own. Eating and drinking  Follow the diet recommended by your health care provider. Drink enough fluid to keep your urine pale yellow. If you vomit: Drink water, juice, or soup when you can drink without vomiting. Make sure you have little or no nausea before eating solid foods. General instructions Take over-the-counter and prescription medicines only as told by your  health care provider. Have a responsible adult stay with you for the time you are told. It is important to have someone help care for you until you are awake and alert. Do not smoke. Keep all follow-up visits as told by your health care provider. This is important. Contact a health care provider if: You are still sleepy or having trouble with balance after 24 hours. You feel light-headed. You keep feeling nauseous or you keep vomiting. You develop a rash. You have a fever. You have redness or swelling around the IV site. Get help right away if: You have trouble breathing. You have new-onset confusion at home. Summary After the procedure, it is common to feel sleepy, have impaired judgment, or feel nauseous if you eat too soon. Rest after you get home. Know the things you should not do after the procedure. Follow the diet recommended by your health care provider and drink enough fluid to keep your urine pale yellow. Get help right away if you have trouble breathing or new-onset confusion at home. This information is not intended to replace advice given to you by your health care provider. Make sure you discuss any questions you have with your health care provider. Document Revised: 12/07/2019 Document Reviewed: 07/05/2019 Elsevier Patient Education  [place].   Gastrostomy Tube Home Guide, Adult A gastrostomy tube, or G-tube, is a tube that is inserted through the abdomen into the stomach. The tube is used to give feedings and medicines when a person cannot eat and drink enough on his or her own or take medicines by mouth. How to care for the insertion site Washing hands with soap  and water.  Supplies needed: Saline solution or clean, warm water and soap. Saline solution is made of salt and water. Cotton swab or gauze. Pre-cut gauze bandage (dressing) and tape, if needed. Instructions Follow these steps daily to clean the insertion site: Wash your hands with soap and water  for at least 20 seconds. Remove the dressing (if there is one) that is between the person's skin and the tube. Check the area where the tube enters the skin. Check daily for problems such as: Redness, rash, or irritation. Swelling. Pus-like drainage. Extra skin growth. Moisten the cotton swab or gauze with the saline solution or with a soap-and-water mixture. Gently clean around the insertion site. Remove any drainage or crusted material. When the G-tube is first put in, a normal saline solution or water can be used to clean the skin. After the skin around the tube has healed, mild soap and water may be used. Apply a dressing (if there should be one) between the person's skin and the tube. How to flush a G-tube Flush the G-tube regularly to keep it from clogging. Flush it before and after feedings and as often as told by the health care provider. Supplies needed: Purified or germ-free (sterile) water, warmed. Container with lid for boiling water, if needed. 60 cc G-tube syringe. Instructions Before you begin, decide whether to use sterile water or purified drinking water. Use only sterile water if: The person has a weak disease-fighting (immune) system. The person has trouble fighting off infections (is immunocompromised). You are unsure about the amount of chemical contaminants in purified or drinking water. Use purified drinking water in all other cases. To purify drinking water by boiling: Boil water for at least 1 minute. Keep lid over water while it boils. Let water cool to room temperature before using. Follow these steps to flush the G-tube: Wash your hands with soap and water for at least 20 seconds. Bring out (draw up) 30 mL of warm water in a syringe. Connect the syringe to the tube. Slowly and gently push the water into the tube. General tips If the tube comes out: Cover the opening with a clean dressing and tape. Get help right away. If there is skin or scar tissue  growing where the tube enters the skin: Keep the area clean and dry. Secure the tube with tape so that the tube does not move around too much. If the tube gets clogged: Slowly push warm water into the tube with a large syringe. Do not force the fluid into the tube or push an object into the tube. Get help right away if you cannot unclog the tube. Follow these instructions at home: Feedings Give feedings at room temperature. If feedings are continuous: Do not put more than 4 hours' worth of feedings in the feeding bag. Stop the feedings when you need to give medicine or flush the tube. Be sure to restart the feedings. Make sure the person's head is above his or her stomach (upright position). This will prevent choking and discomfort. Make sure the person is in the right position during and after feedings. During feedings, have the person in the upright position. After a non-continuous feeding (bolus feeding), have the person stay in the upright position for 1 hour. Cover and place unused feedings in the refrigerator. Replace feeding bags and syringes as told. Good hygiene Make sure the person takes good care of his or her mouth and teeth (oral hygiene), such as by brushing his or her teeth.  Keep the area where the tube enters the skin clean and dry. General instructions Do not pull or put tension on the tube. Before you remove the tube cap or disconnect a syringe, close the tube by using a clamp (clamping) or bending (kinking) the tube. Measure the length of the G-tube every day from the insertion site to the end of the tube. If the person's G-tube has a balloon, check the fluid in the balloon every week. Check the manufacturer's specifications to find the amount of fluid that should be in the balloon. Remove excess air from the G-tube as told. This is called venting. Do not push feedings, medicines, or flushes fast. Use the feeding tube equipment, such as syringes and connectors, only as  told by your health care provider. Contact a health care provider if: The person with the tube has constipation or a fever. A large amount of fluid or mucus-like liquid is leaking from the tube. Skin or scar tissue appears to be growing where the tube enters the skin. The length of tube from the insertion site to the G-tube gets longer. Get help right away if: The person with the tube has any of these problems: Severe pain, tenderness, or bloating in the abdomen. Nausea or vomiting. Trouble breathing or shortness of breath. Any of these problems happen in the area where the tube enters the skin: Redness, irritation, swelling, or soreness. Pus-like discharge. A bad smell. The tube is clogged and cannot be flushed. The tube comes out. The tube will need to be put back in within 4 hours. Summary A gastrostomy tube, or G-tube, is a tube that is inserted through the abdomen into the stomach. The tube is used to give feedings and medicines when a person cannot eat and drink enough on his or her own or cannot take medicine by mouth. Check and clean the insertion site daily as told by the person's health care provider. Flush the G-tube regularly to keep it from clogging. Flush it before and after feedings and as often as told. Keep the area where the tube enters the skin clean and dry. This information is not intended to replace advice given to you by your health care provider. Make sure you discuss any questions you have with your health care provider. Document Revised: 07/30/2020 Document Reviewed: 12/27/2019 Elsevier Patient Education  [place].

## 2022-08-30 NOTE — Progress Notes (Signed)
1240 CBG [place] made aware no new order, did not take insulin today.

## 2022-08-30 NOTE — Telephone Encounter (Signed)
Called patient regarding rescheduled 01/17 appointments, patient's fiance is notified of new appointment.

## 2022-08-30 NOTE — Progress Notes (Signed)
[place] made aware of 294 CBG from 0737 no new order given.

## 2022-09-01 ENCOUNTER — Other Ambulatory Visit: Payer: Medicaid Other

## 2022-09-01 ENCOUNTER — Ambulatory Visit: Payer: Medicaid Other | Admitting: Internal Medicine

## 2022-09-01 ENCOUNTER — Ambulatory Visit: Payer: Medicaid Other

## 2022-09-01 ENCOUNTER — Encounter (HOSPITAL_COMMUNITY)
Admission: RE | Admit: 2022-09-01 | Discharge: 2022-09-01 | Disposition: A | Discharge status: Home / Self Care | Payer: Medicare Other | Source: Ambulatory Visit | Attending: Internal Medicine | Admitting: Internal Medicine

## 2022-09-02 ENCOUNTER — Other Ambulatory Visit: Payer: Self-pay

## 2022-09-02 ENCOUNTER — Inpatient Hospital Stay: Payer: Medicare Other | Attending: Physician Assistant

## 2022-09-02 DIAGNOSIS — R1310 Dysphagia, unspecified: Secondary | ICD-10-CM

## 2022-09-02 DIAGNOSIS — Z79899 Other long term (current) drug therapy: Secondary | ICD-10-CM | POA: Insufficient documentation

## 2022-09-02 DIAGNOSIS — C3411 Malignant neoplasm of upper lobe, right bronchus or lung: Secondary | ICD-10-CM | POA: Diagnosis present

## 2022-09-02 DIAGNOSIS — Z95828 Presence of other vascular implants and grafts: Secondary | ICD-10-CM

## 2022-09-02 DIAGNOSIS — Z5112 Encounter for antineoplastic immunotherapy: Secondary | ICD-10-CM | POA: Insufficient documentation

## 2022-09-02 LAB — CMP (CANCER CENTER ONLY)
ALT: 5 U/L (ref 0–44)
AST: 10 U/L — ABNORMAL LOW (ref 15–41)
Albumin: 3.6 g/dL (ref 3.5–5.0)
Alkaline Phosphatase: 83 U/L (ref 38–126)
Anion gap: 6 (ref 5–15)
BUN: 38 mg/dL — ABNORMAL HIGH (ref 8–23)
CO2: 35 mmol/L — ABNORMAL HIGH (ref 22–32)
Calcium: 9.8 mg/dL (ref 8.9–10.3)
Chloride: 93 mmol/L — ABNORMAL LOW (ref 98–111)
Creatinine: 1.4 mg/dL — ABNORMAL HIGH (ref 0.61–1.24)
GFR, Estimated: 53 mL/min — ABNORMAL LOW (ref >60)
Glucose, Bld: 160 mg/dL — ABNORMAL HIGH (ref 70–99)
Potassium: 4.3 mmol/L (ref 3.5–5.1)
Sodium: 134 mmol/L — ABNORMAL LOW (ref 135–145)
Total Bilirubin: 0.4 mg/dL (ref 0.3–1.2)
Total Protein: 7.6 g/dL (ref 6.5–8.1)

## 2022-09-02 LAB — MAGNESIUM: Magnesium: 2.2 mg/dL (ref 1.7–2.4)

## 2022-09-02 LAB — PHOSPHORUS: Phosphorus: 2.9 mg/dL (ref 2.5–4.6)

## 2022-09-02 MED ORDER — SODIUM CHLORIDE 0.9% FLUSH: 10.0000 mL | INTRAVENOUS | Status: DC | PRN

## 2022-09-02 MED ORDER — HEPARIN SOD (PORK) LOCK FLUSH 100 UNIT/ML IV SOLN
500.0000 [IU] | INTRAVENOUS | Status: AC | PRN
  Administered 2022-09-02: 500 [IU]

## 2022-09-02 MED ORDER — SODIUM CHLORIDE 0.9% FLUSH
10.0000 mL | INTRAVENOUS | Status: AC | PRN
  Administered 2022-09-02: 10 mL

## 2022-09-03 ENCOUNTER — Ambulatory Visit (HOSPITAL_COMMUNITY): Payer: Medicare Other | Admitting: Anesthesiology

## 2022-09-03 ENCOUNTER — Inpatient Hospital Stay: Payer: Medicare Other | Admitting: Dietician

## 2022-09-03 ENCOUNTER — Encounter (HOSPITAL_COMMUNITY)
Admission: RE | Disposition: A | Discharge status: Home / Self Care | Payer: Self-pay | Source: Home / Self Care | Attending: Internal Medicine

## 2022-09-03 ENCOUNTER — Telehealth: Payer: Self-pay | Admitting: Dietician

## 2022-09-03 ENCOUNTER — Ambulatory Visit (HOSPITAL_COMMUNITY)
Admission: RE | Admit: 2022-09-03 | Discharge: 2022-09-03 | Disposition: A | Discharge status: Home / Self Care | Payer: Medicare Other | Attending: Internal Medicine | Admitting: Internal Medicine

## 2022-09-03 ENCOUNTER — Encounter (HOSPITAL_COMMUNITY): Payer: Self-pay

## 2022-09-03 ENCOUNTER — Ambulatory Visit (HOSPITAL_BASED_OUTPATIENT_CLINIC_OR_DEPARTMENT_OTHER): Payer: Medicare Other | Admitting: Anesthesiology

## 2022-09-03 DIAGNOSIS — D649 Anemia, unspecified: Secondary | ICD-10-CM | POA: Insufficient documentation

## 2022-09-03 DIAGNOSIS — Z9221 Personal history of antineoplastic chemotherapy: Secondary | ICD-10-CM | POA: Insufficient documentation

## 2022-09-03 DIAGNOSIS — Z923 Personal history of irradiation: Secondary | ICD-10-CM | POA: Insufficient documentation

## 2022-09-03 DIAGNOSIS — Z931 Gastrostomy status: Secondary | ICD-10-CM | POA: Diagnosis not present

## 2022-09-03 DIAGNOSIS — Z7984 Long term (current) use of oral hypoglycemic drugs: Secondary | ICD-10-CM | POA: Insufficient documentation

## 2022-09-03 DIAGNOSIS — K143 Hypertrophy of tongue papillae: Secondary | ICD-10-CM | POA: Insufficient documentation

## 2022-09-03 DIAGNOSIS — Z87891 Personal history of nicotine dependence: Secondary | ICD-10-CM | POA: Insufficient documentation

## 2022-09-03 DIAGNOSIS — Z95828 Presence of other vascular implants and grafts: Secondary | ICD-10-CM | POA: Diagnosis not present

## 2022-09-03 DIAGNOSIS — R1310 Dysphagia, unspecified: Secondary | ICD-10-CM | POA: Diagnosis not present

## 2022-09-03 DIAGNOSIS — K2100 Gastro-esophageal reflux disease with esophagitis, without bleeding: Secondary | ICD-10-CM | POA: Insufficient documentation

## 2022-09-03 DIAGNOSIS — R634 Abnormal weight loss: Secondary | ICD-10-CM

## 2022-09-03 DIAGNOSIS — D759 Disease of blood and blood-forming organs, unspecified: Secondary | ICD-10-CM | POA: Diagnosis not present

## 2022-09-03 DIAGNOSIS — E1151 Type 2 diabetes mellitus with diabetic peripheral angiopathy without gangrene: Secondary | ICD-10-CM | POA: Insufficient documentation

## 2022-09-03 DIAGNOSIS — J449 Chronic obstructive pulmonary disease, unspecified: Secondary | ICD-10-CM | POA: Insufficient documentation

## 2022-09-03 DIAGNOSIS — Z85818 Personal history of malignant neoplasm of other sites of lip, oral cavity, and pharynx: Secondary | ICD-10-CM | POA: Insufficient documentation

## 2022-09-03 DIAGNOSIS — E1165 Type 2 diabetes mellitus with hyperglycemia: Secondary | ICD-10-CM | POA: Insufficient documentation

## 2022-09-03 DIAGNOSIS — G8929 Other chronic pain: Secondary | ICD-10-CM | POA: Insufficient documentation

## 2022-09-03 DIAGNOSIS — Z85118 Personal history of other malignant neoplasm of bronchus and lung: Secondary | ICD-10-CM | POA: Diagnosis not present

## 2022-09-03 DIAGNOSIS — B3781 Candidal esophagitis: Secondary | ICD-10-CM | POA: Diagnosis not present

## 2022-09-03 DIAGNOSIS — K7010 Alcoholic hepatitis without ascites: Secondary | ICD-10-CM | POA: Insufficient documentation

## 2022-09-03 DIAGNOSIS — Z85528 Personal history of other malignant neoplasm of kidney: Secondary | ICD-10-CM | POA: Insufficient documentation

## 2022-09-03 DIAGNOSIS — Z8711 Personal history of peptic ulcer disease: Secondary | ICD-10-CM | POA: Diagnosis not present

## 2022-09-03 HISTORY — PX: ESOPHAGOGASTRODUODENOSCOPY (EGD) WITH PROPOFOL: SHX5813

## 2022-09-03 LAB — GLUCOSE, CAPILLARY: Glucose-Capillary: 200 mg/dL — ABNORMAL HIGH (ref 70–99)

## 2022-09-03 SURGERY — ESOPHAGOGASTRODUODENOSCOPY (EGD) WITH PROPOFOL
Anesthesia: General

## 2022-09-03 MED ORDER — LACTATED RINGERS IV SOLN: INTRAVENOUS | Status: DC

## 2022-09-03 MED ORDER — LIDOCAINE HCL (CARDIAC) PF 100 MG/5ML IV SOSY
PREFILLED_SYRINGE | INTRAVENOUS | Status: DC | PRN
  Administered 2022-09-03: 60 mg via INTRAVENOUS

## 2022-09-03 MED ORDER — PROPOFOL 10 MG/ML IV BOLUS
INTRAVENOUS | Status: DC | PRN
  Administered 2022-09-03: 80 mg via INTRAVENOUS

## 2022-09-03 MED ORDER — PHENYLEPHRINE 80 MCG/ML (10ML) SYRINGE FOR IV PUSH (FOR BLOOD PRESSURE SUPPORT)
PREFILLED_SYRINGE | INTRAVENOUS | Status: AC
  Filled 2022-09-03: qty 10

## 2022-09-03 MED ORDER — LIDOCAINE HCL (PF) 2 % IJ SOLN
INTRAMUSCULAR | Status: AC
  Filled 2022-09-03: qty 5

## 2022-09-03 MED ORDER — FLUCONAZOLE 200 MG PO TABS: ORAL_TABLET | ORAL | 0 refills | Status: DC

## 2022-09-03 MED ORDER — PROPOFOL 500 MG/50ML IV EMUL
INTRAVENOUS | Status: AC
  Filled 2022-09-03: qty 50

## 2022-09-03 NOTE — Transfer of Care (Signed)
Immediate Anesthesia Transfer of Care Note  Patient: Colin Wagner  Procedure(s) Performed: ESOPHAGOGASTRODUODENOSCOPY (EGD) WITH PROPOFOL  Patient Location: Short Stay  Anesthesia Type:General  Level of Consciousness: awake, alert , and oriented  Airway & Oxygen Therapy: Patient Spontanous Breathing  Post-op Assessment: Report given to RN and Post -op Vital signs reviewed and stable  Post vital signs: Reviewed and stable  Last Vitals:  Vitals Value Taken Time  BP 97/56 09/03/22 1021  Temp    Pulse 96 09/03/22 1021  Resp 20 09/03/22 1021  SpO2 92 % 09/03/22 1021    Last Pain:  Vitals:   09/03/22 1021  PainSc: 0-No pain      Patients Stated Pain Goal: 7 (09/03/22 1021)  Complications: No notable events documented.

## 2022-09-03 NOTE — Op Note (Signed)
[facility] Patient Name: Colin Wagner Procedure Date: 09/03/2022 10:04 AM MRN: [id number] Date of Birth: 09-28-1950 Attending MD: Elon Alas. Abbey Chatters , [place], [id number] CSN: [id number] Age: 72 Admit Type: Outpatient Procedure:                Upper GI endoscopy Indications:              Dysphagia, Weight loss Providers:                Elon Alas. Abbey Chatters, DO, Caprice Kluver, Raphael Gibney,                            Technician Referring MD:              Medicines:                See the Anesthesia note for documentation of the                            administered medications Complications:            No immediate complications. Estimated Blood Loss:     Estimated blood loss: none. Procedure:                Pre-Anesthesia Assessment:                           - The anesthesia plan was to use monitored                            anesthesia care (MAC).                           After obtaining informed consent, the endoscope was                            passed under direct vision. Throughout the                            procedure, the patient's blood pressure, pulse, and                            oxygen saturations were monitored continuously. The                            [id number]-H190 ([id number]) scope was introduced through the                            mouth, and advanced to the second part of duodenum.                            The upper GI endoscopy was accomplished without                            difficulty. The patient tolerated the procedure                            well. Scope In: 10:13:54 AM Scope  Out: 10:16:19 AM Total Procedure Duration: 0 hours 2 minutes 25 seconds  Findings:      Candidal esophagitis with no bleeding was found in the upper third of       the esophagus. 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 2 mild esophageal       webs in the area that are nonobstructive.      There was  evidence of an intact gastrostomy with a patent G-tube present       in the gastric body. This was characterized by healthy appearing mucosa.      The duodenal bulb, first portion of the duodenum and second portion of       the duodenum were normal.      Black hairy tongue noted in oropharynx Impression:               - Candidiasis esophagitis with no bleeding.                           - Intact gastrostomy with a patent G-tube present                            characterized by healthy appearing mucosa.                           - Normal duodenal bulb, first portion of the                            duodenum and second portion of the duodenum.                           - No specimens collected. Moderate Sedation:      Per Anesthesia Care Recommendation:           - Patient has a contact number available for                            emergencies. The signs and symptoms of potential                            delayed complications were discussed with the                            patient. Return to normal activities tomorrow.                            Written discharge instructions were provided to the                            patient.                           - Mechanical soft diet.                           - Use a proton pump inhibitor PO BID.                           -  Diflucan (fluconazole) 200 mg PO daily for 2                            weeks. Take 467m on day 1.                           - Previously noted radiation-induced esophagitis in                            the middle third of the esophagus has completely                            healed. 2 mild esophageal webs in the area that are                            nonobstructive. If dysphagia not improved s/p                            treatment for candidal esphagitis, can consider                            repeat EGD with dilation though esophageal webs are                            mild.                            - Need better oral hygeine Procedure Code(s):        --- Professional ---                           [id number] Esophagogastroduodenoscopy, flexible,                            transoral; diagnostic, including collection of                            specimen(s) by brushing or washing, when performed                            (separate procedure) Diagnosis Code(s):        --- Professional ---                           B37.81, Candidal esophagitis                           Z93.1, Gastrostomy status                           R13.10, Dysphagia, unspecified                           R63.4, Abnormal weight loss CPT copyright 2022 [employer]. All rights reserved. The codes documented in this report are preliminary and upon coder review may  be revised to meet current compliance  requirements. Elon Alas. Abbey Chatters, DO [place] Abbey Chatters, DO 09/03/2022 10:24:39 AM This report has been signed electronically. Number of Addenda: 0

## 2022-09-03 NOTE — Telephone Encounter (Signed)
Nutrition Follow-up:  Patient with non-small cell lung cancer. He is receiving consolidation therapy with durvalumab q28d. Patient completed concurrent chemoradiation therapy (final radiation 11/14).  PEG placed 1/8  Spoke with patients wife via telephone. She reports pt is currently sleeping s/p EGD earlier today. Per wife, dilation unable to be completed due to esophageal thrush. She reports pt is tolerating Glucerna 1.5 well. Wife continues one carton TID, reports giving one 16.9 oz bottle of water each feeding (1500 ml/day).    Medications: reviewed   Labs: 1/11 labs reviewed - glucose 160, BUN 38, Cr 1.40, Na 134 Mg/Phos/K - WNL  Anthropometrics: Pt 150 lb 2.1 oz   12/20 - 153 lb 1.6 oz 12/3 - 171 lb 15.3 oz   Estimated Energy Needs  Kcals: 2080-2290 Protein: 104-118 Fluid: >2L  NUTRITION DIAGNOSIS: Unintended wt loss continues - advancing tube feedings to goal as tolerated   INTERVENTION:  Refeeding labs reviewed, recommend increasing by one carton day as tolerated to goal as provided below Wife reports [employer] has contacted - formula + supplies expected on 1/14. She has enough formula for the weekend (complimentary case of Glucerna 1.5 provided 1/8)  -6 cartons Glucerna 1.5 split over 3 feedings/day. Flush tube with 180 ml water 5x/day. Provides 2136 kcal, 117.6 g protein, 1080 ml free water (1980 ml total water with flushes)    MONITORING, EVALUATION, GOAL: weight trends, tube feeding    NEXT VISIT: Tuesday January 16 during infusion

## 2022-09-03 NOTE — Progress Notes (Signed)
See telephone note

## 2022-09-03 NOTE — Interval H&P Note (Signed)
History and Physical Interval Note:  09/03/2022 9:23 AM  Colin Wagner  has presented today for surgery, with the diagnosis of dysphagia.  The various methods of treatment have been discussed with the patient and family. After consideration of risks, benefits and other options for treatment, the patient has consented to  Procedure(s) with comments: ESOPHAGOGASTRODUODENOSCOPY (EGD) WITH PROPOFOL (N/A) - 10:15 am BALLOON DILATION (N/A) as a surgical intervention.  The patient's history has been reviewed, patient examined, no change in status, stable for surgery.  I have reviewed the patient's chart and labs.  Questions were answered to the patient's satisfaction.     Lanelle Bal

## 2022-09-03 NOTE — Anesthesia Preprocedure Evaluation (Signed)
Anesthesia Evaluation  Patient identified by MRN, date of birth, ID band Patient awake    Reviewed: Allergy & Precautions, H&P , NPO status , Patient's Chart, lab work & pertinent test results  History of Anesthesia Complications (+) DIFFICULT AIRWAY and history of anesthetic complications  Airway Mallampati: III  TM Distance: >3 FB Neck ROM: Limited  Mouth opening: Limited Mouth Opening Comment: Squamous cell carcinoma of mandible, s/p radiation Tracheostomy?  Dental  (+) Dental Advisory Given, Missing, Poor Dentition, Chipped   Pulmonary pneumonia, resolved, COPD,  COPD inhaler, former smoker   Pulmonary exam normal breath sounds clear to auscultation       Cardiovascular hypertension, + Peripheral Vascular Disease (carotid stent)  Normal cardiovascular exam Rhythm:Regular Rate:Normal     Neuro/Psych  Headaches  negative psych ROS   GI/Hepatic PUD,GERD  Medicated,,(+)     substance abuse  alcohol useChronic alcoholic hepatitis    Endo/Other  diabetes, Poorly Controlled, Type 2, Oral Hypoglycemic Agents    Renal/GU Renal InsufficiencyRenal disease (left renal cancer)  negative genitourinary   Musculoskeletal negative musculoskeletal ROS (+)    Abdominal   Peds negative pediatric ROS (+)  Hematology  (+) Blood dyscrasia, anemia   Anesthesia Other Findings Squamous cell carcinoma of mandible  Reproductive/Obstetrics negative OB ROS                             Anesthesia Physical Anesthesia Plan  ASA: 4  Anesthesia Plan: General   Post-op Pain Management: Minimal or no pain anticipated   Induction: Intravenous  PONV Risk Score and Plan: Propofol infusion and TIVA  Airway Management Planned: Nasal Cannula and Natural Airway  Additional Equipment:   Intra-op Plan:   Post-operative Plan:   Informed Consent: I have reviewed the patients History and Physical, chart, labs and  discussed the procedure including the risks, benefits and alternatives for the proposed anesthesia with the patient or authorized representative who has indicated his/her understanding and acceptance.       Plan Discussed with: CRNA  Anesthesia Plan Comments:         Anesthesia Quick Evaluation

## 2022-09-03 NOTE — Discharge Instructions (Signed)
EGD Discharge instructions Please read the instructions outlined below and refer to this sheet in the next few weeks. These discharge instructions provide you with general information on caring for yourself after you leave the hospital. Your doctor may also give you specific instructions. While your treatment has been planned according to the most current medical practices available, unavoidable complications occasionally occur. If you have any problems or questions after discharge, please call your doctor. ACTIVITY You may resume your regular activity but move at a slower pace for the next 24 hours.  Take frequent rest periods for the next 24 hours.  Walking will help expel (get rid of) the air and reduce the bloated feeling in your abdomen.  No driving for 24 hours (because of the anesthesia (medicine) used during the test).  You may shower.  Do not sign any important legal documents or operate any machinery for 24 hours (because of the anesthesia used during the test).  NUTRITION Drink plenty of fluids.  You may resume your normal diet.  Begin with a light meal and progress to your normal diet.  Avoid alcoholic beverages for 24 hours or as instructed by your caregiver.  MEDICATIONS You may resume your normal medications unless your caregiver tells you otherwise.  WHAT YOU CAN EXPECT TODAY You may experience abdominal discomfort such as a feeling of fullness or "gas" pains.  FOLLOW-UP Your doctor will discuss the results of your test with you.  SEEK IMMEDIATE MEDICAL ATTENTION IF ANY OF THE FOLLOWING OCCUR: Excessive nausea (feeling sick to your stomach) and/or vomiting.  Severe abdominal pain and distention (swelling).  Trouble swallowing.  Temperature over 101 F (37.8 C).  Rectal bleeding or vomiting of blood.   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  No tight stricture in the area.  You do have evidence however of a yeast infection in your esophagus  called candidal esophagitis.  I am going to send in fluconazole for this.  Take 400 mg day 1 and then 200 mg daily x 14 days total.  You also need to practice better oral hygiene with brushing your teeth regularly as well as your tongue.  Continue on PPI twice daily.  Stomach and small bowel appeared normal.  Follow-up with GI in 6 to 8 weeks.   I hope you have a great rest of your week!  Hennie Duos. Marletta Lor, D.O. Gastroenterology and Hepatology [facility]

## 2022-09-03 NOTE — Anesthesia Postprocedure Evaluation (Signed)
Anesthesia Post Note  Patient: EDWARDS MCKELVIE  Procedure(s) Performed: ESOPHAGOGASTRODUODENOSCOPY (EGD) WITH PROPOFOL  Patient location during evaluation: Endoscopy Anesthesia Type: General Level of consciousness: awake and alert Pain management: pain level controlled Vital Signs Assessment: post-procedure vital signs reviewed and stable Respiratory status: spontaneous breathing, nonlabored ventilation, respiratory function stable and patient connected to nasal cannula oxygen Cardiovascular status: blood pressure returned to baseline and stable Postop Assessment: no apparent nausea or vomiting Anesthetic complications: no   There were no known notable events for this encounter.   Last Vitals:  Vitals:   09/03/22 1021 09/03/22 1025  BP: (!) 97/56 120/72  Pulse: 96   Resp: 20   Temp:    SpO2: 92%     Last Pain:  Vitals:   09/03/22 1021  PainSc: 0-No pain                 Glynis Smiles

## 2022-09-05 NOTE — Progress Notes (Signed)
[facility] OFFICE PROGRESS NOTE  Gareth Morgan, MD [address] [place] [address]  DIAGNOSIS: Stage IIIc (T4, N3, M0) non-small cell lung cancer, squamous cell carcinoma presented with large right upper lobe lung mass in addition to mediastinal and bilateral hilar lymphadenopathy diagnosed in September 2023.    PRIOR THERAPY:  A course of concurrent chemoradiation with weekly carboplatin for AUC of 2 and paclitaxel 45 Mg/M2.  Last dose on 07/05/22. Status post 6 cycles.    CURRENT THERAPY:  Consolidation immunotherapy with Imfinzi 1500 mg IV every 4 weeks.  First dose on 08/11/22.  Status post 1 cycle.   INTERVAL HISTORY: Colin [facility] 72 y.o. male returns to the clinic today for a follow-up visit accompanied by his girlfriend.  The patient was last seen 1 month ago.  The patient is currently undergoing maintenance immunotherapy with Imfinzi and is tolerating well without any adverse side effects.  Unfortunately, the patient has had significant weight loss since he completed his course of concurrent chemoradiation.  He had esophagitis when he was hospitalized in December 2023 and had an EGD on 07/26/2022 which showed moderate to severe esophagitis with bleeding.  Due to the persistent weight loss, the patient ended up having a PEG tube placed on 08/30/2022.  The patient had a repeat EGD performed by Dr. Marletta Lor on 09/03/2022 which no longer showed esophagitis but he had candidiasis.  Which was treated with diflucan.   He establish care with a neurologist for possible seizures. He is scheduled for repeat brain MRI later this month.   The patient is following with member the nutritionist team for his PEG tube.  He is scheduled to see them today.  The patient denies any fever, chills, night sweats.  He lost about 5 more pounds since last seen last month.  He reports his breathing is stable.  Denies any chest pain or hemoptysis.  He denies any cough unless he swallows something  incorrectly.  He still has a hard time suctioning his secretions and has a suction machine at home.  Denies any headache or visual changes.  Denies any nausea, vomiting, diarrhea, or constipation.  Denies any rashes or skin changes.  He is here today for evaluation repeat blood work before undergoing cycle #2.  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    Right shoulder pain    Testicular hypofunction     ALLERGIES:  is allergic to trazodone.  MEDICATIONS:  Current Outpatient Medications  Medication Sig Dispense Refill   acetaminophen (TYLENOL)  500 MG tablet Take 1,000 mg by mouth every 6 (six) hours as needed for mild pain or moderate pain.     albuterol (VENTOLIN HFA) 108 (90 Base) MCG/ACT inhaler Inhale 2 puffs into the lungs every 4 (four) hours as needed for wheezing or shortness of breath.     amLODipine (NORVASC) 10 MG tablet Take 1 tablet (10 mg total) by mouth daily. 30 tablet 5   aspirin EC 81 MG tablet Take 1 tablet (81 mg total) by mouth daily with breakfast. 30 tablet 11   atorvastatin (LIPITOR) 40 MG tablet Take 1 tablet (40 mg total) by mouth daily. 30 tablet 5   cholecalciferol (VITAMIN D) 25 MCG (1000 UNIT) tablet Take 1,000  Units by mouth daily.     clopidogrel (PLAVIX) 75 MG tablet Take 1 tablet (75 mg total) by mouth daily. 30 tablet 5   ferrous sulfate 325 (65 FE) MG tablet Take 1 tablet (325 mg total) by mouth daily with breakfast. 30 tablet 3   fluconazole (DIFLUCAN) 200 MG tablet Take 2 tablets on day 1 then 1 tablet daily thereafter for a total of 14 days. 15 tablet 0   fluticasone (FLONASE) 50 MCG/ACT nasal spray Place 2 sprays into both nostrils daily.     HYDROcodone-acetaminophen (NORCO) 7.5-325 MG tablet Take 1 tablet by mouth every 6 (six) hours as needed for moderate pain.     LEVEMIR FLEXTOUCH 100 UNIT/ML FlexTouch Pen Inject 18 Units into the skin at bedtime. (Patient taking differently: Inject 15 Units into the skin at bedtime.) 15 mL 11   levETIRAcetam (KEPPRA) 100 MG/ML solution Take 7.5 mL (750mg ) twice a day 450 mL 12   LORazepam (ATIVAN) 1 MG tablet Take 1 mg by mouth every 6 (six) hours as needed for anxiety or sleep.  5   megestrol (MEGACE) 40 MG/ML suspension Take 10 mLs (400 mg total) by mouth daily. 480 mL 3   metoCLOPramide (REGLAN) 10 MG tablet Take 1 tablet (10 mg total) by mouth every 6 (six) hours as needed (Hiccups.). 20 tablet 1   metoprolol succinate (TOPROL XL) 100 MG 24 hr tablet Take 1 tablet (100 mg total) by mouth daily. Take with or immediately following a meal. 30 tablet 5   Nutritional Supplements (FEEDING SUPPLEMENT, GLUCERNA 1.5 CAL,) LIQD Give 2 cartons 3 times per day (8am. Noon, 8pm).  Flush with 29ml water before and after each feeding.  Give additional 157ml BID between feedings for additional hydration. Start with 1 carton 3 times per day. Flush with 120 ml before and after each feeding. 1422 mL 2   nystatin cream (MYCOSTATIN) Apply to affected area 2 times daily 80 g 0   OVER THE COUNTER MEDICATION Take 10 mLs by mouth 2 (two) times daily. maalox     pantoprazole (PROTONIX) 40 MG tablet Take 1 tablet (40 mg total) by mouth 2 (two) times daily. 60 tablet 3    prochlorperazine (COMPAZINE) 10 MG tablet TAKE 1 TABLET(10 MG) BY MOUTH EVERY 6 HOURS AS NEEDED FOR NAUSEA OR VOMITING (Patient taking differently: Take 10 mg by mouth every 6 (six) hours as needed for vomiting or nausea.) 30 tablet 2   senna (SENOKOT) 8.6 MG TABS tablet Take 1 tablet (8.6 mg total) by mouth at bedtime as needed for mild constipation. May be needed as pain medication can constipate. 120 each 1   sildenafil (VIAGRA) 100 MG tablet Take 100 mg by mouth daily as needed for erectile dysfunction.     sucralfate (  CARAFATE) 1 g tablet Take 1 tablet (1 g total) by mouth 4 (four) times daily -  with meals and at bedtime. May dissolve 1 tab and glass of water and drink as needed 120 tablet 1   testosterone cypionate (DEPOTESTOSTERONE CYPIONATE) 200 MG/ML injection Inject 1.5 mLs into the muscle every 14 (fourteen) days.     tiZANidine (ZANAFLEX) 4 MG tablet Take 4 mg by mouth every 6 (six) hours as needed for muscle spasms.     umeclidinium-vilanterol (ANORO ELLIPTA) 62.5-25 MCG/ACT AEPB Inhale 1 puff into the lungs daily. 60 each 5   No current facility-administered medications for this visit.   Facility-Administered Medications Ordered in Other Visits  Medication Dose Route Frequency Provider Last Rate Last Admin   0.9 %  sodium chloride infusion   Intravenous Once Si Gaul, MD       durvalumab (IMFINZI) 1,500 mg in sodium chloride 0.9 % 100 mL chemo infusion  1,500 mg Intravenous Once Si Gaul, MD       heparin lock flush 100 unit/mL  500 Units Intracatheter Once PRN Si Gaul, MD       sodium chloride flush (NS) 0.9 % injection 10 mL  10 mL Intracatheter PRN Si Gaul, MD        SURGICAL HISTORY:  Past Surgical History:  Procedure Laterality Date   BALLOON DILATION  12/31/2021   Procedure: BALLOON DILATION;  Surgeon: Corbin Ade, MD;  [facility];  Service: Endoscopy;;   BIOPSY  04/30/2022   Procedure: BIOPSY;  Surgeon: Omar Person, MD;  Location: [facility];  Service: Pulmonary;;   CAROTID STENT  06/2018   COLONOSCOPY  2003   Dr. Elpidio Anis, polyps   COLONOSCOPY  2005   Dr. Elpidio Anis, multiple diverticula   COLONOSCOPY  2008   Dr. Lovell Sheehan, diverticulosis   COLONOSCOPY WITH PROPOFOL N/A 10/21/2016   Dr. Jena Gauss: Diverticulosis, two 5-7 mm polyps removed. path-tubular adenomas.  Next colonoscopy in 5 years.   COLONOSCOPY WITH PROPOFOL N/A 12/31/2021   Procedure: COLONOSCOPY WITH PROPOFOL;  Surgeon: Corbin Ade, MD;  [facility];  Service: Endoscopy;  Laterality: N/A;  8:15am   ENDOBRONCHIAL ULTRASOUND Right 04/30/2022   Procedure: ENDOBRONCHIAL ULTRASOUND;  Surgeon: Omar Person, MD;  Location: [facility];  Service: Pulmonary;  Laterality: Right;   ESOPHAGEAL BRUSHING  07/26/2022   Procedure: ESOPHAGEAL BRUSHING;  Surgeon: Lanelle Bal, DO;  [facility];  Service: Endoscopy;;   ESOPHAGEAL DILATION N/A 07/26/2022   Procedure: ESOPHAGEAL DILATION;  Surgeon: Lanelle Bal, DO;  [facility];  Service: Endoscopy;  Laterality: N/A;   ESOPHAGOGASTRODUODENOSCOPY     Multiple EGDs. 1999 EGD showed gastric ulcers, no H pylori and benign biopsies performed by Dr. Elpidio Anis. 2001 gastric ulcer healed. Last EGD 2005 had gastritis.   ESOPHAGOGASTRODUODENOSCOPY (EGD) WITH PROPOFOL N/A 12/31/2021   narrowing/deformity at UES s/p baloon dilation   ESOPHAGOGASTRODUODENOSCOPY (EGD) WITH PROPOFOL N/A 07/26/2022   Procedure: ESOPHAGOGASTRODUODENOSCOPY (EGD) WITH PROPOFOL;  Surgeon: Lanelle Bal, DO;  [facility];  Service: Endoscopy;  Laterality: N/A;   FINE NEEDLE ASPIRATION  04/30/2022   Procedure: FINE NEEDLE ASPIRATION (FNA) LINEAR;  Surgeon: Omar Person, MD;  Location: [facility];  Service: Pulmonary;;   IR GASTROSTOMY TUBE MOD SED  08/30/2022   IR GASTROSTOMY TUBE REMOVAL  02/08/2020   IR IMAGING GUIDED PORT INSERTION  05/26/2022   IR REPLACE  G-TUBE SIMPLE WO FLUORO  03/20/2019  IR REPLACE G-TUBE SIMPLE WO FLUORO  09/21/2019   IR REPLC GASTRO/COLONIC TUBE PERCUT W/FLUORO  05/16/2018   Left partial mandibulectomy, Scapular free flap reconstruction, selective neck dissection, tracheotomy, and resection of intraoral palate cancer. Left 03/01/2017   [facility]   LUNG BIOPSY     MOUTH SURGERY     PARTIAL NEPHRECTOMY Left 2012   POLYPECTOMY  10/21/2016   Procedure: POLYPECTOMY;  Surgeon: Corbin Ade, MD;  [facility];  Service: Endoscopy;;  hepatic flexure x2   POLYPECTOMY  12/31/2021   Procedure: POLYPECTOMY INTESTINAL;  Surgeon: Corbin Ade, MD;  [facility];  Service: Endoscopy;;   SURGERY OF LIP  06/2019   lip revision   TRANSURETHRAL RESECTION OF PROSTATE N/A 03/01/2019   Procedure: TRANSURETHRAL RESECTION OF THE PROSTATE (TURP)WITH CYSTOSCOPY;  Surgeon: Heloise Purpura, MD;  Location: [facility];  Service: Urology;  Laterality: N/A;   VIDEO BRONCHOSCOPY Right 04/30/2022   Procedure: VIDEO BRONCHOSCOPY WITHOUT FLUORO;  Surgeon: Omar Person, MD;  Location[facility];  Service: Pulmonary;  Laterality: Right;    REVIEW OF SYSTEMS:   Constitutional: Positive for weight loss, fatigue, and appetite change.  Negative for chills and fever.  HENT: Negative for mouth sores, nosebleeds, sore throat and trouble swallowing.   Eyes: Negative for eye problems and icterus.  Respiratory: Positive for stable dyspnea on exertion and cough.  Negative for hemoptysis and wheezing.   Cardiovascular: Negative for chest pain and leg swelling.  Gastrointestinal: Negative for abdominal pain, constipation, diarrhea, nausea and vomiting.  Genitourinary: Negative for bladder incontinence, difficulty urinating, dysuria, frequency and hematuria.   Musculoskeletal: Negative for back pain, gait problem, neck pain and neck stiffness.  Skin: Negative for itching and rash.  Neurological: Negative for  dizziness, extremity weakness, gait problem, headaches, light-headedness and seizures.  Hematological: Negative for adenopathy. Does not bruise/bleed easily.  Psychiatric/Behavioral: Negative for confusion, depression and sleep disturbance. The patient is not nervous/anxious.  Positive for speech disturbance due to prior history of head/neck cancer.    PHYSICAL EXAMINATION:  Blood pressure 125/67, pulse 96, temperature 98.4 F (36.9 C), temperature source Oral, resp. rate 18, height 5\' 7"  (1.702 m), weight 148 lb 1.6 oz (67.2 kg), SpO2 91 %.  ECOG PERFORMANCE STATUS: 2  Physical Exam  Constitutional: Oriented to person, place, and time and thin appearing male and in no distress.  HENT:  Head: Normocephalic and atraumatic. Left sided swelling and drooping- s/p radiation changes hx head/neck cancer  Mouth/Throat: Oropharynx is clear and moist. No oropharyngeal exudate.  Eyes: Conjunctivae are normal. Right eye exhibits no discharge. Left eye exhibits no discharge. No scleral icterus.  Neck: Normal range of motion. Neck supple.  Cardiovascular: Normal rate, regular rhythm, quiet breath sounds bilaterally and intact distal pulses.   Pulmonary/Chest: Effort normal and breath sounds normal. No respiratory distress. No wheezes. No rales.  Abdominal: Soft. Bowel sounds are normal. Exhibits no distension and no mass. There is no tenderness.  Musculoskeletal: Normal range of motion. Exhibits no edema.  Lymphadenopathy:    No cervical adenopathy.  Neurological: Alert and oriented to person, place, and time. Exhibits normal muscle tone. Gait normal. Coordination normal.  Skin: Skin is warm and dry. No rash noted. Not diaphoretic. No erythema. No pallor.  Psychiatric: Mood, memory and judgment normal.  Vitals reviewed.  LABORATORY DATA: Lab Results  Component Value Date   WBC 8.7 09/07/2022   HGB 11.6 (L) 09/07/2022   HCT 36.7 (L) 09/07/2022  MCV 87.8 09/07/2022   PLT 301 09/07/2022       Chemistry      Component Value Date/Time   NA 134 (L) 09/07/2022 0837   K 4.7 09/07/2022 0837   CL 93 (L) 09/07/2022 0837   CO2 35 (H) 09/07/2022 0837   BUN 24 (H) 09/07/2022 0837   CREATININE 1.36 (H) 09/07/2022 0837      Component Value Date/Time   CALCIUM 9.8 09/07/2022 0837   ALKPHOS 91 09/07/2022 0837   AST 14 (L) 09/07/2022 0837   ALT 6 09/07/2022 0837   BILITOT 0.5 09/07/2022 0837       RADIOGRAPHIC STUDIES:  IR Gastrostomy Tube  Result Date: 08/30/2022 INDICATION: Recurrent dysphagia and esophagitis due to chemo radiation. Patient having issues with aspiration. Requires replacement of percutaneous gastrostomy tube. EXAM: Fluoroscopically guided placement of percutaneous pull-through gastrostomy tube Interventional Radiologist:  Sterling Big, MD MEDICATIONS: 2 g Ancef; Antibiotics were administered within 1 hour of the procedure. ANESTHESIA/SEDATION: Versed 3 mg IV; Fentanyl 100 mcg IV Moderate Sedation Time:  20 minutes The patient's vital signs and level of consciousness were continuously monitored during the procedure by the interventional radiology nurse under my direct supervision. CONTRAST:  10 mL Omnipaque 300 administered into the stomach FLUOROSCOPY: Radiation exposure index: 23 mGy reference air kerma COMPLICATIONS: None immediate. PROCEDURE: Informed written consent was obtained from the patient after a thorough discussion of the procedural risks, benefits and alternatives. All questions were addressed. Maximal Sterile Barrier Technique was utilized including caps, mask, sterile gowns, sterile gloves, sterile drape, hand hygiene and skin antiseptic. A timeout was performed prior to the initiation of the procedure. Maximal barrier sterile technique utilized including caps, mask, sterile gowns, sterile gloves, large sterile drape, hand hygiene, and chlorhexadine skin prep. An angled catheter was advanced over a wire under fluoroscopic guidance through the nose, down the  esophagus and into the body of the stomach. The stomach was then insufflated with several 100 ml of air. Fluoroscopy confirmed location of the gastric bubble, as well as inferior displacement of the barium stained colon. Under direct fluoroscopic guidance, a single T-tack was placed, and the anterior gastric wall drawn up against the anterior abdominal wall. Percutaneous access was then obtained into the mid gastric body with an 18 gauge sheath needle. Aspiration of air, and injection of contrast material under fluoroscopy confirmed needle placement. An Amplatz wire was advanced in the gastric body and the access needle exchanged for a 9-French vascular sheath. A snare device was advanced through the vascular sheath and an Amplatz wire advanced through the angled catheter. The Amplatz wire was successfully snared and this was pulled up through the esophagus and out the mouth. A 20-French Burnell Blanks MIC-PEG tube was then connected to the snare and pulled through the mouth, down the esophagus, into the stomach and out to the anterior abdominal wall. Hand injection of contrast material confirmed intragastric location. The T-tack retention suture was then cut. The pull through peg tube was then secured with the external bumper and capped. The patient will be observed for several hours with the newly placed tube on low wall suction to evaluate for any post procedure complication. The patient tolerated the procedure well, there is no immediate complication. IMPRESSION: Successful placement of a 20 French pull through gastrostomy tube. Electronically Signed   By: Malachy Moan M.D.   On: 08/30/2022 11:32   CT Head Wo Contrast  Result Date: 08/10/2022 CLINICAL DATA:  Mental status change. EXAM: CT HEAD  WITHOUT CONTRAST TECHNIQUE: Contiguous axial images were obtained from the base of the skull through the vertex without intravenous contrast. RADIATION DOSE REDUCTION: This exam was performed according to the  departmental dose-optimization program which includes automated exposure control, adjustment of the mA and/or kV according to patient size and/or use of iterative reconstruction technique. COMPARISON:  Head CT 07/25/2022.  MRI brain 07/13/2022. FINDINGS: Brain: No evidence of acute infarction, hemorrhage, hydrocephalus, extra-axial collection or mass lesion/mass effect. Vascular: No hyperdense vessel or unexpected calcification. Skull: Normal. Negative for fracture or focal lesion. Sinuses/Orbits: Left mastoid effusion is unchanged. Paranasal sinuses are clear. Orbits are within normal limits. Other: None. IMPRESSION: No acute intracranial abnormality. Stable left mastoid effusion. Electronically Signed   By: Darliss Cheney M.D.   On: 08/10/2022 00:27     ASSESSMENT/PLAN:  This is a very pleasant 72 year old African-American male diagnosed with stage IIIc (T4, N3, M0) non-small cell lung cancer, squamous cell carcinoma.  The patient presented with a large right upper lobe lung mass in addition to mediastinal and bilateral lymphadenopathy.  The patient was diagnosed in September 2023.    He completed a course of concurrent chemoradiation with weekly carboplatin for an AUC of 2 paclitaxel 45 mg per metered square.  The patient is status post 6 cycles.     The plan is to undergo consolidation immunotherapy with Imfinzi 1500 mg IV every 4 weeks.  He is status post 1 cycle   Labs were reviewed.  Recommend that he proceed with cycle #2 today scheduled.   We will see him back for follow-up visit in 4 weeks for evaluation and repeat blood work before undergoing cycle #3.   He will continue following with neurology for possible seizures. He is scheduled for brain MRI later this month.   The patient will follow with member the nutritionist team and gastroenterology for his weight loss and PEG tube management. He is scheduled to see nutrition today.   They will follow up with his PCP for additional suction  machine supplies.   The patient was advised to call immediately if he has any concerning symptoms in the interval. The patient voices understanding of current disease status and treatment options and is in agreement with the current care plan. All questions were answered. The patient knows to call the clinic with any problems, questions or concerns. We can certainly see the patient much sooner if necessary   No orders of the defined types were placed in this encounter.     The total time spent in the appointment was 20-29 minutes.  Kristine Tiley L Elery Cadenhead, PA-C 09/07/22

## 2022-09-07 ENCOUNTER — Inpatient Hospital Stay (HOSPITAL_BASED_OUTPATIENT_CLINIC_OR_DEPARTMENT_OTHER): Payer: Medicare Other | Admitting: Physician Assistant

## 2022-09-07 ENCOUNTER — Inpatient Hospital Stay: Payer: Medicare Other

## 2022-09-07 ENCOUNTER — Other Ambulatory Visit: Payer: Self-pay

## 2022-09-07 ENCOUNTER — Inpatient Hospital Stay: Payer: Medicare Other | Admitting: Dietician

## 2022-09-07 VITALS — BP 125/67 | HR 96 | Temp 98.4°F | Resp 18 | Ht 67.0 in | Wt 148.1 lb

## 2022-09-07 VITALS — BP 119/65 | HR 90 | Resp 18

## 2022-09-07 DIAGNOSIS — C3411 Malignant neoplasm of upper lobe, right bronchus or lung: Secondary | ICD-10-CM | POA: Diagnosis not present

## 2022-09-07 DIAGNOSIS — Z5112 Encounter for antineoplastic immunotherapy: Secondary | ICD-10-CM | POA: Diagnosis not present

## 2022-09-07 LAB — CMP (CANCER CENTER ONLY)
ALT: 6 U/L (ref 0–44)
AST: 14 U/L — ABNORMAL LOW (ref 15–41)
Albumin: 3.5 g/dL (ref 3.5–5.0)
Alkaline Phosphatase: 91 U/L (ref 38–126)
Anion gap: 6 (ref 5–15)
BUN: 24 mg/dL — ABNORMAL HIGH (ref 8–23)
CO2: 35 mmol/L — ABNORMAL HIGH (ref 22–32)
Calcium: 9.8 mg/dL (ref 8.9–10.3)
Chloride: 93 mmol/L — ABNORMAL LOW (ref 98–111)
Creatinine: 1.36 mg/dL — ABNORMAL HIGH (ref 0.61–1.24)
GFR, Estimated: 55 mL/min — ABNORMAL LOW (ref >60)
Glucose, Bld: 189 mg/dL — ABNORMAL HIGH (ref 70–99)
Potassium: 4.7 mmol/L (ref 3.5–5.1)
Sodium: 134 mmol/L — ABNORMAL LOW (ref 135–145)
Total Bilirubin: 0.5 mg/dL (ref 0.3–1.2)
Total Protein: 7.8 g/dL (ref 6.5–8.1)

## 2022-09-07 LAB — CBC WITH DIFFERENTIAL (CANCER CENTER ONLY)
Abs Immature Granulocytes: 0.02 10*3/uL (ref 0.00–0.07)
Basophils Absolute: 0 10*3/uL (ref 0.0–0.1)
Basophils Relative: 0 %
Eosinophils Absolute: 0.1 10*3/uL (ref 0.0–0.5)
Eosinophils Relative: 1 %
HCT: 36.7 % — ABNORMAL LOW (ref 39.0–52.0)
Hemoglobin: 11.6 g/dL — ABNORMAL LOW (ref 13.0–17.0)
Immature Granulocytes: 0 %
Lymphocytes Relative: 4 %
Lymphs Abs: 0.3 10*3/uL — ABNORMAL LOW (ref 0.7–4.0)
MCH: 27.8 pg (ref 26.0–34.0)
MCHC: 31.6 g/dL (ref 30.0–36.0)
MCV: 87.8 fL (ref 80.0–100.0)
Monocytes Absolute: 0.6 10*3/uL (ref 0.1–1.0)
Monocytes Relative: 6 %
Neutro Abs: 7.7 10*3/uL (ref 1.7–7.7)
Neutrophils Relative %: 89 %
Platelet Count: 301 10*3/uL (ref 150–400)
RBC: 4.18 MIL/uL — ABNORMAL LOW (ref 4.22–5.81)
RDW: 20.8 % — ABNORMAL HIGH (ref 11.5–15.5)
WBC Count: 8.7 10*3/uL (ref 4.0–10.5)
nRBC: 0 % (ref 0.0–0.2)

## 2022-09-07 MED ORDER — HEPARIN SOD (PORK) LOCK FLUSH 100 UNIT/ML IV SOLN
500.0000 [IU] | Freq: Once | INTRAVENOUS | Status: AC | PRN
  Administered 2022-09-07: 500 [IU]

## 2022-09-07 MED ORDER — SODIUM CHLORIDE 0.9 % IV SOLN: Freq: Once | INTRAVENOUS | Status: AC

## 2022-09-07 MED ORDER — SODIUM CHLORIDE 0.9% FLUSH
10.0000 mL | INTRAVENOUS | Status: DC | PRN
  Administered 2022-09-07: 10 mL

## 2022-09-07 MED ORDER — SODIUM CHLORIDE 0.9 % IV SOLN
1500.0000 mg | Freq: Once | INTRAVENOUS | Status: AC
  Administered 2022-09-07: 1500 mg via INTRAVENOUS
  Filled 2022-09-07: qty 30

## 2022-09-07 MED ORDER — ACETAMINOPHEN 160 MG/5ML PO SOLN
650.0000 mg | ORAL | Status: AC
  Administered 2022-09-07: 650 mg via ORAL
  Filled 2022-09-07: qty 20.3

## 2022-09-07 NOTE — Progress Notes (Signed)
Patient complaining of a headache in the infusion room. 6 out of 10 pain. Per Cassie, PA ok to order tylenol suspension 650mg . Patient is unable to swallow tablets at this time.

## 2022-09-07 NOTE — Patient Instructions (Signed)
[facility]  Discharge Instructions: Thank you for choosing [facility] to provide your oncology and hematology care.   If you have a lab appointment with the Cancer Center, please go directly to the Cancer Center and check in at the registration area.   Wear comfortable clothing and clothing appropriate for easy access to any Portacath or PICC line.   We strive to give you quality time with your provider. You may need to reschedule your appointment if you arrive late (15 or more minutes).  Arriving late affects you and other patients whose appointments are after yours.  Also, if you miss three or more appointments without notifying the office, you may be dismissed from the clinic at the provider's discretion.      For prescription refill requests, have your pharmacy contact our office and allow 72 hours for refills to be completed.    Today you received the following chemotherapy and/or immunotherapy agents; Imfinzi      To help prevent nausea and vomiting after your treatment, we encourage you to take your nausea medication as directed.  BELOW ARE SYMPTOMS THAT SHOULD BE REPORTED IMMEDIATELY: *FEVER GREATER THAN 100.4 F (38 C) OR HIGHER *CHILLS OR SWEATING *NAUSEA AND VOMITING THAT IS NOT CONTROLLED WITH YOUR NAUSEA MEDICATION *UNUSUAL SHORTNESS OF BREATH *UNUSUAL BRUISING OR BLEEDING *URINARY PROBLEMS (pain or burning when urinating, or frequent urination) *BOWEL PROBLEMS (unusual diarrhea, constipation, pain near the anus) TENDERNESS IN MOUTH AND THROAT WITH OR WITHOUT PRESENCE OF ULCERS (sore throat, sores in mouth, or a toothache) UNUSUAL RASH, SWELLING OR PAIN  UNUSUAL VAGINAL DISCHARGE OR ITCHING   Items with * indicate a potential emergency and should be followed up as soon as possible or go to the Emergency Department if any problems should occur.  Please show the CHEMOTHERAPY ALERT CARD or IMMUNOTHERAPY ALERT CARD at check-in to  the Emergency Department and triage nurse.  Should you have questions after your visit or need to cancel or reschedule your appointment, please contact [facility]  Dept: [phone]  and follow the prompts.  Office hours are 8:00 a.m. to 4:30 p.m. Monday - Friday. Please note that voicemails left after 4:00 p.m. may not be returned until the following business day.  We are closed weekends and major holidays. You have access to a nurse at all times for urgent questions. Please call the main number to the clinic Dept: [phone] and follow the prompts.   For any non-urgent questions, you may also contact your provider using MyChart. We now offer e-Visits for anyone 18 and older to request care online for non-urgent symptoms. For details visit [email or web address].   Also download the MyChart app! Go to the app store, search "MyChart", open the app, select [facility], and log in with your MyChart username and password.  Masks are optional in the cancer centers. If you would like for your care team to wear a mask while they are taking care of you, please let them know. You may have one support person who is at least 73 years old accompany you for your appointments. 

## 2022-09-07 NOTE — Progress Notes (Signed)
Nutrition Follow-up:  Patient with non-small cell lung cancer. He is receiving consolidation therapy with durvalumab q28d. Patient completed concurrent chemoradiation therapy (final radiation 11/14).   PEG placed 1/8  Met with patient during infusion. He reports advancing tube feedings towards goal. Patient says he needs more to eat. He is not eating/drinking orally secondary to esophageal candidiasis noted on 1/14 EGD. Currently, pt is giving 1 1/2 cartons Glucerna 1.5 TID. He is unsure if formula/supplies were delivered on 1/14. Wife not present at visit. Informed pt that additional case of Glucerna could be provided today if needed. He denies nausea, vomiting, diarrhea, constipation.    Medications: Diflucan  Labs: Na 134, BUN 24, Cr 1.36, glucose 186  Anthropometrics: Wt 148 lb 1.6 oz today decreased from 150 lb 3.2 oz on 1/4  Estimated Energy Needs  Kcals: 2080-2290 Protein: 104-118 Fluid: >2 L  NUTRITION DIAGNOSIS: Unintended weight loss continues - continue advancing tube feedings to goal    INTERVENTION:  Continue advancing tube feedings to goal 6 cartons Glucerna 1.5 split over 3 feedings/day. Flush tube with 180 ml water 5x/day. Provides 2136 kcal, 117.6 g protein, 1080 ml free water (1980 ml total water with flushes)   Provided contact information - instructed pt to have wife contact if additional formula/supplies are needed    MONITORING, EVALUATION, GOAL: weight trends, tube feedings   NEXT VISIT: Wednesday February 14 during infusion

## 2022-09-08 ENCOUNTER — Encounter: Payer: Medicaid Other | Admitting: Dietician

## 2022-09-08 ENCOUNTER — Other Ambulatory Visit: Payer: Medicaid Other

## 2022-09-08 ENCOUNTER — Ambulatory Visit: Payer: Medicaid Other | Admitting: Internal Medicine

## 2022-09-08 ENCOUNTER — Ambulatory Visit: Payer: Medicaid Other

## 2022-09-09 ENCOUNTER — Telehealth: Payer: Self-pay | Admitting: Internal Medicine

## 2022-09-09 NOTE — Telephone Encounter (Signed)
Scheduled per 01/16 los, called and spoke with patient's relative. Patient will be notified.

## 2022-09-10 ENCOUNTER — Encounter (HOSPITAL_COMMUNITY): Payer: Self-pay | Admitting: Internal Medicine

## 2022-09-13 ENCOUNTER — Other Ambulatory Visit: Payer: Self-pay | Admitting: Medical Oncology

## 2022-09-13 DIAGNOSIS — Z95828 Presence of other vascular implants and grafts: Secondary | ICD-10-CM

## 2022-09-13 NOTE — Progress Notes (Signed)
Portacath orders entered for radiology.

## 2022-09-17 ENCOUNTER — Other Ambulatory Visit: Payer: Medicaid Other

## 2022-09-17 ENCOUNTER — Inpatient Hospital Stay
Admission: RE | Admit: 2022-09-17 | Discharge: 2022-09-17 | Disposition: A | Discharge status: Home / Self Care | Payer: Medicaid Other | Source: Ambulatory Visit | Attending: Neurology | Admitting: Neurology

## 2022-09-24 ENCOUNTER — Telehealth: Payer: Self-pay

## 2022-09-24 NOTE — Telephone Encounter (Signed)
This nurse received a call from [facility] stating that patient is scheduled for a rgular lab appointment on 2/13 and it should be a port flush with lab.  This nurse made the changes. Also verified that patient was scheduled for infusion because it was not showing on My Chart.  Advised that infusion is scheduled for 915 am.   No further questions or concerns noted at this time.

## 2022-09-29 ENCOUNTER — Other Ambulatory Visit: Payer: Medicaid Other

## 2022-09-29 ENCOUNTER — Ambulatory Visit: Payer: Medicaid Other | Admitting: Physician Assistant

## 2022-09-29 ENCOUNTER — Ambulatory Visit: Payer: Medicaid Other

## 2022-10-02 ENCOUNTER — Other Ambulatory Visit: Payer: Self-pay

## 2022-10-02 ENCOUNTER — Ambulatory Visit
Admission: RE | Admit: 2022-10-02 | Discharge: 2022-10-02 | Disposition: A | Discharge status: Home / Self Care | Payer: Medicare Other | Source: Ambulatory Visit | Attending: Neurology

## 2022-10-02 DIAGNOSIS — R569 Unspecified convulsions: Secondary | ICD-10-CM

## 2022-10-02 MED ORDER — GADOPICLENOL 0.5 MMOL/ML IV SOLN
7.0000 mL | Freq: Once | INTRAVENOUS | Status: AC | PRN
  Administered 2022-10-02: 7 mL via INTRAVENOUS

## 2022-10-05 ENCOUNTER — Inpatient Hospital Stay: Payer: Medicare Other

## 2022-10-05 ENCOUNTER — Inpatient Hospital Stay: Payer: Medicare Other | Admitting: Internal Medicine

## 2022-10-05 ENCOUNTER — Inpatient Hospital Stay: Payer: Medicare Other | Attending: Physician Assistant | Admitting: Dietician

## 2022-10-05 ENCOUNTER — Other Ambulatory Visit: Payer: Medicaid Other

## 2022-10-06 ENCOUNTER — Other Ambulatory Visit: Payer: Medicaid Other

## 2022-10-06 ENCOUNTER — Emergency Department (HOSPITAL_COMMUNITY): Payer: Medicare Other

## 2022-10-06 ENCOUNTER — Ambulatory Visit: Payer: Medicaid Other | Admitting: Physician Assistant

## 2022-10-06 ENCOUNTER — Encounter: Payer: Medicaid Other | Admitting: Dietician

## 2022-10-06 ENCOUNTER — Inpatient Hospital Stay (HOSPITAL_COMMUNITY)
Admission: EM | Admit: 2022-10-06 | Discharge: 2022-10-09 | DRG: 871 | Disposition: A | Discharge status: Home / Self Care | Payer: Medicare Other | Attending: Internal Medicine | Admitting: Internal Medicine

## 2022-10-06 ENCOUNTER — Ambulatory Visit: Payer: Medicaid Other

## 2022-10-06 ENCOUNTER — Other Ambulatory Visit: Payer: Self-pay

## 2022-10-06 DIAGNOSIS — A419 Sepsis, unspecified organism: Principal | ICD-10-CM | POA: Diagnosis present

## 2022-10-06 DIAGNOSIS — I10 Essential (primary) hypertension: Secondary | ICD-10-CM

## 2022-10-06 DIAGNOSIS — Z931 Gastrostomy status: Secondary | ICD-10-CM

## 2022-10-06 DIAGNOSIS — C060 Malignant neoplasm of cheek mucosa: Secondary | ICD-10-CM | POA: Diagnosis present

## 2022-10-06 DIAGNOSIS — E440 Moderate protein-calorie malnutrition: Secondary | ICD-10-CM | POA: Diagnosis present

## 2022-10-06 DIAGNOSIS — N179 Acute kidney failure, unspecified: Secondary | ICD-10-CM | POA: Diagnosis present

## 2022-10-06 DIAGNOSIS — E119 Type 2 diabetes mellitus without complications: Secondary | ICD-10-CM

## 2022-10-06 DIAGNOSIS — Z7982 Long term (current) use of aspirin: Secondary | ICD-10-CM

## 2022-10-06 DIAGNOSIS — K219 Gastro-esophageal reflux disease without esophagitis: Secondary | ICD-10-CM | POA: Diagnosis present

## 2022-10-06 DIAGNOSIS — N1832 Chronic kidney disease, stage 3b: Secondary | ICD-10-CM | POA: Diagnosis present

## 2022-10-06 DIAGNOSIS — E1165 Type 2 diabetes mellitus with hyperglycemia: Secondary | ICD-10-CM | POA: Diagnosis present

## 2022-10-06 DIAGNOSIS — K861 Other chronic pancreatitis: Secondary | ICD-10-CM | POA: Diagnosis present

## 2022-10-06 DIAGNOSIS — Z7951 Long term (current) use of inhaled steroids: Secondary | ICD-10-CM

## 2022-10-06 DIAGNOSIS — E1122 Type 2 diabetes mellitus with diabetic chronic kidney disease: Secondary | ICD-10-CM | POA: Diagnosis present

## 2022-10-06 DIAGNOSIS — R569 Unspecified convulsions: Secondary | ICD-10-CM | POA: Diagnosis present

## 2022-10-06 DIAGNOSIS — Z7902 Long term (current) use of antithrombotics/antiplatelets: Secondary | ICD-10-CM

## 2022-10-06 DIAGNOSIS — E871 Hypo-osmolality and hyponatremia: Secondary | ICD-10-CM | POA: Diagnosis present

## 2022-10-06 DIAGNOSIS — Z923 Personal history of irradiation: Secondary | ICD-10-CM

## 2022-10-06 DIAGNOSIS — Z85528 Personal history of other malignant neoplasm of kidney: Secondary | ICD-10-CM

## 2022-10-06 DIAGNOSIS — J690 Pneumonitis due to inhalation of food and vomit: Secondary | ICD-10-CM | POA: Insufficient documentation

## 2022-10-06 DIAGNOSIS — J9621 Acute and chronic respiratory failure with hypoxia: Secondary | ICD-10-CM | POA: Diagnosis present

## 2022-10-06 DIAGNOSIS — Z905 Acquired absence of kidney: Secondary | ICD-10-CM

## 2022-10-06 DIAGNOSIS — Z9981 Dependence on supplemental oxygen: Secondary | ICD-10-CM | POA: Diagnosis not present

## 2022-10-06 DIAGNOSIS — Z79899 Other long term (current) drug therapy: Secondary | ICD-10-CM

## 2022-10-06 DIAGNOSIS — Z794 Long term (current) use of insulin: Secondary | ICD-10-CM

## 2022-10-06 DIAGNOSIS — Z87891 Personal history of nicotine dependence: Secondary | ICD-10-CM

## 2022-10-06 DIAGNOSIS — J440 Chronic obstructive pulmonary disease with acute lower respiratory infection: Secondary | ICD-10-CM | POA: Diagnosis present

## 2022-10-06 DIAGNOSIS — I129 Hypertensive chronic kidney disease with stage 1 through stage 4 chronic kidney disease, or unspecified chronic kidney disease: Secondary | ICD-10-CM | POA: Diagnosis present

## 2022-10-06 DIAGNOSIS — Z1152 Encounter for screening for COVID-19: Secondary | ICD-10-CM | POA: Diagnosis not present

## 2022-10-06 DIAGNOSIS — Z8249 Family history of ischemic heart disease and other diseases of the circulatory system: Secondary | ICD-10-CM

## 2022-10-06 DIAGNOSIS — Z681 Body mass index (BMI) 19 or less, adult: Secondary | ICD-10-CM | POA: Diagnosis not present

## 2022-10-06 DIAGNOSIS — R6520 Severe sepsis without septic shock: Secondary | ICD-10-CM | POA: Diagnosis present

## 2022-10-06 DIAGNOSIS — E785 Hyperlipidemia, unspecified: Secondary | ICD-10-CM | POA: Diagnosis present

## 2022-10-06 DIAGNOSIS — J189 Pneumonia, unspecified organism: Secondary | ICD-10-CM | POA: Diagnosis present

## 2022-10-06 DIAGNOSIS — C069 Malignant neoplasm of mouth, unspecified: Secondary | ICD-10-CM

## 2022-10-06 DIAGNOSIS — Z885 Allergy status to narcotic agent status: Secondary | ICD-10-CM

## 2022-10-06 LAB — CBC WITH DIFFERENTIAL/PLATELET
Abs Immature Granulocytes: 0.07 10*3/uL (ref 0.00–0.07)
Basophils Absolute: 0 10*3/uL (ref 0.0–0.1)
Basophils Relative: 0 %
Eosinophils Absolute: 0.1 10*3/uL (ref 0.0–0.5)
Eosinophils Relative: 1 %
HCT: 33.6 % — ABNORMAL LOW (ref 39.0–52.0)
Hemoglobin: 10.5 g/dL — ABNORMAL LOW (ref 13.0–17.0)
Immature Granulocytes: 0 %
Lymphocytes Relative: 1 %
Lymphs Abs: 0.2 10*3/uL — ABNORMAL LOW (ref 0.7–4.0)
MCH: 28.2 pg (ref 26.0–34.0)
MCHC: 31.3 g/dL (ref 30.0–36.0)
MCV: 90.1 fL (ref 80.0–100.0)
Monocytes Absolute: 0.7 10*3/uL (ref 0.1–1.0)
Monocytes Relative: 4 %
Neutro Abs: 15.4 10*3/uL — ABNORMAL HIGH (ref 1.7–7.7)
Neutrophils Relative %: 94 %
Platelets: 272 10*3/uL (ref 150–400)
RBC: 3.73 MIL/uL — ABNORMAL LOW (ref 4.22–5.81)
RDW: 16.6 % — ABNORMAL HIGH (ref 11.5–15.5)
WBC: 16.5 10*3/uL — ABNORMAL HIGH (ref 4.0–10.5)
nRBC: 0 % (ref 0.0–0.2)

## 2022-10-06 LAB — COMPREHENSIVE METABOLIC PANEL
ALT: 11 U/L (ref 0–44)
AST: 19 U/L (ref 15–41)
Albumin: 3.2 g/dL — ABNORMAL LOW (ref 3.5–5.0)
Alkaline Phosphatase: 91 U/L (ref 38–126)
Anion gap: 12 (ref 5–15)
BUN: 69 mg/dL — ABNORMAL HIGH (ref 8–23)
CO2: 31 mmol/L (ref 22–32)
Calcium: 9.1 mg/dL (ref 8.9–10.3)
Chloride: 90 mmol/L — ABNORMAL LOW (ref 98–111)
Creatinine, Ser: 2.44 mg/dL — ABNORMAL HIGH (ref 0.61–1.24)
GFR, Estimated: 27 mL/min — ABNORMAL LOW (ref >60)
Glucose, Bld: 312 mg/dL — ABNORMAL HIGH (ref 70–99)
Potassium: 5 mmol/L (ref 3.5–5.1)
Sodium: 133 mmol/L — ABNORMAL LOW (ref 135–145)
Total Bilirubin: 0.7 mg/dL (ref 0.3–1.2)
Total Protein: 7.2 g/dL (ref 6.5–8.1)

## 2022-10-06 LAB — CBG MONITORING, ED: Glucose-Capillary: 365 mg/dL — ABNORMAL HIGH (ref 70–99)

## 2022-10-06 LAB — GLUCOSE, CAPILLARY: Glucose-Capillary: 257 mg/dL — ABNORMAL HIGH (ref 70–99)

## 2022-10-06 LAB — HEMOGLOBIN A1C
Hgb A1c MFr Bld: 8.9 % — ABNORMAL HIGH (ref 4.8–5.6)
Mean Plasma Glucose: 208.73 mg/dL

## 2022-10-06 LAB — RESP PANEL BY RT-PCR (RSV, FLU A&B, COVID)  RVPGX2
Influenza A by PCR: NEGATIVE
Influenza B by PCR: NEGATIVE
Resp Syncytial Virus by PCR: NEGATIVE
SARS Coronavirus 2 by RT PCR: NEGATIVE

## 2022-10-06 LAB — SEDIMENTATION RATE: Sed Rate: 107 mm/hr — ABNORMAL HIGH (ref 0–16)

## 2022-10-06 LAB — PROTIME-INR
INR: 2 — ABNORMAL HIGH (ref 0.8–1.2)
Prothrombin Time: 22.8 seconds — ABNORMAL HIGH (ref 11.4–15.2)

## 2022-10-06 LAB — APTT: aPTT: 33 seconds (ref 24–36)

## 2022-10-06 LAB — LACTIC ACID, PLASMA
Lactic Acid, Venous: 1.9 mmol/L (ref 0.5–1.9)
Lactic Acid, Venous: 0.7 mmol/L (ref 0.5–1.9)

## 2022-10-06 MED ORDER — CLOPIDOGREL BISULFATE 75 MG PO TABS
75.0000 mg | ORAL_TABLET | Freq: Every day | ORAL | Status: DC
  Filled 2022-10-06: qty 1

## 2022-10-06 MED ORDER — OSMOLITE 1.5 CAL PO LIQD
1000.0000 mL | ORAL | Status: DC
  Administered 2022-10-06: 1000 mL
  Filled 2022-10-06: qty 1000

## 2022-10-06 MED ORDER — ENOXAPARIN SODIUM 30 MG/0.3ML IJ SOSY
30.0000 mg | PREFILLED_SYRINGE | INTRAMUSCULAR | Status: DC
  Administered 2022-10-06: 30 mg via SUBCUTANEOUS
  Filled 2022-10-06: qty 0.3

## 2022-10-06 MED ORDER — LEVETIRACETAM 100 MG/ML PO SOLN
750.0000 mg | Freq: Two times a day (BID) | ORAL | Status: DC
  Administered 2022-10-06 – 2022-10-09 (×7): 750 mg
  Filled 2022-10-06 (×10): qty 7.5

## 2022-10-06 MED ORDER — INSULIN GLARGINE-YFGN 100 UNIT/ML ~~LOC~~ SOLN
12.0000 [IU] | Freq: Every day | SUBCUTANEOUS | Status: DC
  Administered 2022-10-06: 12 [IU] via SUBCUTANEOUS
  Filled 2022-10-06 (×2): qty 0.12

## 2022-10-06 MED ORDER — ASPIRIN 81 MG PO TBEC
81.0000 mg | DELAYED_RELEASE_TABLET | Freq: Every day | ORAL | Status: DC
  Filled 2022-10-06: qty 1

## 2022-10-06 MED ORDER — SODIUM CHLORIDE 0.9 % IV BOLUS
1000.0000 mL | Freq: Once | INTRAVENOUS | Status: AC
  Administered 2022-10-06: 1000 mL via INTRAVENOUS

## 2022-10-06 MED ORDER — ATORVASTATIN CALCIUM 40 MG PO TABS
40.0000 mg | ORAL_TABLET | Freq: Every day | ORAL | Status: DC
  Administered 2022-10-06: 40 mg via ORAL
  Filled 2022-10-06: qty 1

## 2022-10-06 MED ORDER — SODIUM CHLORIDE 0.9 % IV SOLN
2.0000 g | Freq: Once | INTRAVENOUS | Status: AC
  Administered 2022-10-06: 2 g via INTRAVENOUS
  Filled 2022-10-06: qty 12.5

## 2022-10-06 MED ORDER — HYDROCODONE-ACETAMINOPHEN 7.5-325 MG PO TABS
1.0000 | ORAL_TABLET | Freq: Four times a day (QID) | ORAL | Status: DC | PRN
  Administered 2022-10-06: 1 via ORAL
  Filled 2022-10-06 (×2): qty 1

## 2022-10-06 MED ORDER — LORAZEPAM 2 MG/ML IJ SOLN: 1.0000 mg | INTRAMUSCULAR | Status: DC | PRN

## 2022-10-06 MED ORDER — PROCHLORPERAZINE EDISYLATE 10 MG/2ML IJ SOLN: 10.0000 mg | Freq: Four times a day (QID) | INTRAMUSCULAR | Status: DC | PRN

## 2022-10-06 MED ORDER — LACTATED RINGERS IV BOLUS (SEPSIS)
1000.0000 mL | Freq: Once | INTRAVENOUS | Status: AC
  Administered 2022-10-06: 1000 mL via INTRAVENOUS

## 2022-10-06 MED ORDER — ACETAMINOPHEN 650 MG RE SUPP: 650.0000 mg | Freq: Four times a day (QID) | RECTAL | Status: DC | PRN

## 2022-10-06 MED ORDER — INSULIN ASPART 100 UNIT/ML IJ SOLN
0.0000 [IU] | Freq: Three times a day (TID) | INTRAMUSCULAR | Status: DC
  Administered 2022-10-06: 5 [IU] via SUBCUTANEOUS
  Administered 2022-10-07: 9 [IU] via SUBCUTANEOUS

## 2022-10-06 MED ORDER — PANTOPRAZOLE SODIUM 40 MG IV SOLR
40.0000 mg | INTRAVENOUS | Status: DC
  Administered 2022-10-06 – 2022-10-09 (×4): 40 mg via INTRAVENOUS
  Filled 2022-10-06 (×4): qty 10

## 2022-10-06 MED ORDER — INSULIN ASPART 100 UNIT/ML IJ SOLN: 0.0000 [IU] | Freq: Every day | INTRAMUSCULAR | Status: DC

## 2022-10-06 MED ORDER — LACTATED RINGERS IV SOLN: INTRAVENOUS | Status: AC

## 2022-10-06 MED ORDER — FLUTICASONE PROPIONATE 50 MCG/ACT NA SUSP
2.0000 | Freq: Every day | NASAL | Status: DC
  Administered 2022-10-06 – 2022-10-09 (×4): 2 via NASAL
  Filled 2022-10-06 (×2): qty 16

## 2022-10-06 MED ORDER — SODIUM CHLORIDE 0.9 % IV SOLN
500.0000 mg | Freq: Once | INTRAVENOUS | Status: DC
  Administered 2022-10-06: 500 mg via INTRAVENOUS
  Filled 2022-10-06: qty 5

## 2022-10-06 MED ORDER — ONDANSETRON HCL 4 MG/2ML IJ SOLN
4.0000 mg | Freq: Four times a day (QID) | INTRAMUSCULAR | Status: DC | PRN
  Administered 2022-10-06 – 2022-10-07 (×2): 4 mg via INTRAVENOUS
  Filled 2022-10-06 (×2): qty 2

## 2022-10-06 MED ORDER — SENNA 8.6 MG PO TABS: 1.0000 | ORAL_TABLET | Freq: Every evening | ORAL | Status: DC | PRN

## 2022-10-06 MED ORDER — LACTATED RINGERS IV BOLUS (SEPSIS)
250.0000 mL | Freq: Once | INTRAVENOUS | Status: AC
  Administered 2022-10-06: 250 mL via INTRAVENOUS

## 2022-10-06 MED ORDER — VITAMIN D 25 MCG (1000 UNIT) PO TABS
1000.0000 [IU] | ORAL_TABLET | Freq: Every day | ORAL | Status: DC
  Filled 2022-10-06: qty 1

## 2022-10-06 MED ORDER — ACETAMINOPHEN 325 MG PO TABS
650.0000 mg | ORAL_TABLET | Freq: Four times a day (QID) | ORAL | Status: DC | PRN
  Administered 2022-10-06: 650 mg via ORAL
  Filled 2022-10-06 (×2): qty 2

## 2022-10-06 MED ORDER — SODIUM CHLORIDE 0.9 % IV SOLN
3.0000 g | Freq: Two times a day (BID) | INTRAVENOUS | Status: DC
  Administered 2022-10-06: 3 g via INTRAVENOUS
  Filled 2022-10-06 (×2): qty 8

## 2022-10-06 MED ORDER — IPRATROPIUM-ALBUTEROL 0.5-2.5 (3) MG/3ML IN SOLN: 3.0000 mL | Freq: Four times a day (QID) | RESPIRATORY_TRACT | Status: DC | PRN

## 2022-10-06 MED ORDER — ONDANSETRON HCL 4 MG PO TABS: 4.0000 mg | ORAL_TABLET | Freq: Four times a day (QID) | ORAL | Status: DC | PRN

## 2022-10-06 MED ORDER — UMECLIDINIUM-VILANTEROL 62.5-25 MCG/ACT IN AEPB
1.0000 | INHALATION_SPRAY | Freq: Every day | RESPIRATORY_TRACT | Status: DC
  Administered 2022-10-07 – 2022-10-09 (×3): 1 via RESPIRATORY_TRACT
  Filled 2022-10-06: qty 14

## 2022-10-06 NOTE — Progress Notes (Signed)
Elink is following Sepsis bundle

## 2022-10-06 NOTE — Assessment & Plan Note (Addendum)
-  patient with AKI on CKD stage 3b In the setting of prerenal azotemia and continue use of nephrotoxic agents. -Improved and pretty much back to baseline now. -Continue to minimize the use of nephrotoxic agent -Continue to maintain adequate hydration -Repeat basic metabolic panel to follow renal function trend and stability.

## 2022-10-06 NOTE — ED Notes (Signed)
Pt on 5 L [place], SpO2 99%

## 2022-10-06 NOTE — ED Notes (Signed)
MD at bedside. 

## 2022-10-06 NOTE — Assessment & Plan Note (Addendum)
-  Resume home hypoglycemic regimen -Close monitoring of patient's CBGs/A1c with further adjustment to his medications as required. -A1c checked during hospitalization 8.9

## 2022-10-06 NOTE — ED Provider Notes (Signed)
[facility] Provider Note   CSN: [id number] Arrival date & time: 10/06/22  1026     History  Chief Complaint  Patient presents with   Seizures    Colin Wagner is a 72 y.o. male.   Seizures Patient brought in with potential seizure activity.  For EMS reportedly had seizures.  Was hypotensive and hypoxic.  States blood pressures were in the 50s over 20s.  Heart rate was 105.  Was confused.  Is on 4 L oxygen at baseline.  States CBG was elevated.  History of seizure-like activity.  Also history of cancers.  Has PEG tube.  Patient cannot really provide much history at this time.    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    Right shoulder pain    Testicular hypofunction     Home Medications Prior to Admission medications   Medication Sig Start Date End Date Taking? Authorizing Provider  acetaminophen (TYLENOL) 500 MG tablet Take 1,000 mg by mouth every 6 (six) hours as needed for mild pain or moderate pain.    [provider]  albuterol (VENTOLIN HFA) 108 (90 Base) MCG/ACT inhaler Inhale 2 puffs into the lungs every 4 (four) hours as needed for wheezing or shortness of breath. 03/29/22    [provider]  amLODipine (NORVASC) 10 MG tablet Take 1 tablet (10 mg total) by mouth daily. 07/27/22   Roxan Hockey, MD  aspirin EC 81 MG tablet Take 1 tablet (81 mg total) by mouth daily with breakfast. 07/27/22   Denton Brick, Courage, MD  atorvastatin (LIPITOR) 40 MG tablet Take 1 tablet (40 mg total) by mouth daily. 07/27/22   Roxan Hockey, MD  cholecalciferol (VITAMIN D) 25 MCG (1000 UNIT) tablet Take 1,000 Units by mouth daily.    [provider]  clopidogrel (PLAVIX) 75 MG tablet Take 1 tablet (75 mg total) by mouth daily. 07/27/22   Roxan Hockey, MD  ferrous sulfate 325 (65 FE) MG tablet Take 1 tablet (325 mg total) by mouth daily with breakfast. 07/27/22   Denton Brick, Courage, MD  fluconazole (DIFLUCAN) 200 MG tablet Take 2 tablets on day 1 then 1 tablet daily thereafter for a total of 14 days. 09/03/22   [name], DO  fluticasone (FLONASE) 50 MCG/ACT nasal spray Place 2 sprays into both nostrils daily.    [provider]  HYDROcodone-acetaminophen (NORCO) 7.5-325 MG tablet Take 1 tablet by mouth every 6 (six) hours as needed for moderate pain. 06/30/22   [provider]  LEVEMIR FLEXTOUCH 100 UNIT/ML FlexTouch Pen Inject 18 Units into the skin at bedtime. Patient taking differently: Inject 15 Units into the skin at bedtime. 07/27/22   Roxan Hockey, MD  levETIRAcetam (KEPPRA) 100 MG/ML solution Take 7.5 mL (750mg ) twice a day 08/24/22   Cameron Sprang, MD  LORazepam (ATIVAN) 1 MG tablet Take 1 mg by mouth every 6 (six) hours as needed for anxiety or sleep. 03/31/15   [provider]  megestrol (MEGACE) 40 MG/ML suspension Take 10 mLs (400 mg total) by mouth daily. 07/27/22   Roxan Hockey, MD  metoCLOPramide (REGLAN) 10 MG tablet Take 1 tablet (10 mg total) by mouth every 6 (six) hours as needed (Hiccups.). 07/14/22 07/14/23  Eugenie Filler, MD  metoprolol succinate (TOPROL XL) 100 MG 24 hr tablet Take 1 tablet (100 mg total) by  mouth daily. Take with or immediately following a meal. 07/27/22 07/27/23  Roxan Hockey, MD  Nutritional Supplements (FEEDING SUPPLEMENT, GLUCERNA 1.5 CAL,) LIQD Give 2 cartons 3 times per day (8am. Noon, 8pm).  Flush with 70ml water before and after each feeding.  Give additional 189ml BID between feedings for additional hydration. Start with 1 carton 3 times per day. Flush with 120 ml before and after each feeding. 08/30/22   Heilingoetter, Cassandra L, PA-C  nystatin cream (MYCOSTATIN) Apply to affected area 2 times daily 06/13/22   Volney American, PA-C  OVER THE COUNTER MEDICATION Take 10 mLs by mouth 2 (two) times daily. maalox    [provider]  pantoprazole (PROTONIX) 40 MG tablet Take 1 tablet (40 mg total) by mouth 2 (two) times daily. 07/27/22   Roxan Hockey, MD  prochlorperazine (COMPAZINE) 10 MG tablet TAKE 1 TABLET(10 MG) BY MOUTH EVERY 6 HOURS AS NEEDED FOR NAUSEA OR VOMITING Patient taking differently: Take 10 mg by mouth every 6 (six) hours as needed for vomiting or nausea. 07/15/22   Heilingoetter, Cassandra L, PA-C  senna (SENOKOT) 8.6 MG TABS tablet Take 1 tablet (8.6 mg total) by mouth at bedtime as needed for mild constipation. May be needed as pain medication can constipate. 01/12/17   [name], MD  sildenafil (VIAGRA) 100 MG tablet Take 100 mg by mouth daily as needed for erectile dysfunction.    [provider]  sucralfate (CARAFATE) 1 g tablet Take 1 tablet (1 g total) by mouth 4 (four) times daily -  with meals and at bedtime. May dissolve 1 tab and glass of water and drink as needed 07/27/22   Roxan Hockey, MD  testosterone cypionate (DEPOTESTOSTERONE CYPIONATE) 200 MG/ML injection Inject 1.5 mLs into the muscle every 14 (fourteen) days. 05/22/22   [provider]  tiZANidine (ZANAFLEX) 4 MG tablet Take 4 mg by mouth every 6 (six) hours as needed for muscle spasms. 06/30/22   [provider]  umeclidinium-vilanterol (ANORO  ELLIPTA) 62.5-25 MCG/ACT AEPB Inhale 1 puff into the lungs daily. 07/27/22   Roxan Hockey, MD      Allergies    Trazodone    Review of Systems   Review of Systems  Neurological:  Positive for seizures.    Physical Exam Updated Vital Signs BP 114/79   Pulse 87   Temp 97.7 F (36.5 C) (Oral)   Resp (!) 22   Ht 5\' 7"  (1.702 m)   Wt 67.2 kg   SpO2 100%   BMI 23.20 kg/m  Physical Exam Vitals and nursing note reviewed.  Constitutional:      Appearance: Normal appearance.  Pulmonary:  Comments: Harsh breath sounds bilaterally. Abdominal:     Comments: PEG tube left abdomen.  Musculoskeletal:        General: No tenderness.  Neurological:     Comments: Chronic facial droop.  Appears somewhat confused.  Moving all extremities.  Per EMS this is his reported baseline.     ED Results / Procedures / Treatments   Labs (all labs ordered are listed, but only abnormal results are displayed) Labs Reviewed  COMPREHENSIVE METABOLIC PANEL - Abnormal; Notable for the following components:      Result Value   Sodium 133 (*)    Chloride 90 (*)    Glucose, Bld 312 (*)    BUN 69 (*)    Creatinine, Ser 2.44 (*)    Albumin 3.2 (*)    GFR, Estimated 27 (*)    All other components within normal limits  CBC WITH DIFFERENTIAL/PLATELET - Abnormal; Notable for the following components:   WBC 16.5 (*)    RBC 3.73 (*)    Hemoglobin 10.5 (*)    HCT 33.6 (*)    RDW 16.6 (*)    Neutro Abs 15.4 (*)    Lymphs Abs 0.2 (*)    All other components within normal limits  PROTIME-INR - Abnormal; Notable for the following components:   Prothrombin Time 22.8 (*)    INR 2.0 (*)    All other components within normal limits  CBG MONITORING, ED - Abnormal; Notable for the following components:   Glucose-Capillary 365 (*)    All other components within normal limits  RESP PANEL BY RT-PCR (RSV, FLU A&B, COVID)  RVPGX2  CULTURE, BLOOD (ROUTINE X 2)  CULTURE, BLOOD (ROUTINE X 2)  LACTIC ACID,  PLASMA  APTT  LEVETIRACETAM LEVEL  LACTIC ACID, PLASMA    EKG EKG Interpretation  Date/Time:  Wednesday October 06 2022 10:32:31 EST Ventricular Rate:  97 PR Interval:  140 QRS Duration: 81 QT Interval:  349 QTC Calculation: 444 R Axis:   78 Text Interpretation: Sinus rhythm Consider left atrial enlargement Low voltage, precordial leads No significant change since last tracing Confirmed by Davonna Belling [phone]) on 10/06/2022 12:03:05 PM  Radiology DG Chest Portable 1 View  Result Date: 10/06/2022 CLINICAL DATA:  Shortness of breath. EXAM: PORTABLE CHEST 1 VIEW COMPARISON:  July 25, 2022.  July 10, 2022. FINDINGS: Stable cardiomediastinal silhouette. Left-sided Port-A-Cath is unchanged in position. Stable right upper lobe opacity is noted concerning for atelectasis noted on prior CT scan. New right midlung opacity is noted concerning for possible pneumonia. Minimal bibasilar subsegmental atelectasis or scarring is noted. The visualized skeletal structures are unremarkable. IMPRESSION: Minimal bibasilar subsegmental atelectasis or scarring. New right midlung opacity is noted concerning for possible pneumonia. Followup PA and lateral chest X-ray is recommended in 3-4 weeks following trial of antibiotic therapy to ensure resolution and exclude underlying malignancy. Electronically Signed   By: Marijo Conception M.D.   On: 10/06/2022 10:53    Procedures Procedures    Medications Ordered in ED Medications  lactated ringers infusion (0 mLs Intravenous Hold 10/06/22 1119)  azithromycin (ZITHROMAX) 500 mg in sodium chloride 0.9 % 250 mL IVPB (500 mg Intravenous New Bag/Given 10/06/22 1153)  sodium chloride 0.9 % bolus 1,000 mL (0 mLs Intravenous Stopped 10/06/22 1129)  lactated ringers bolus 1,000 mL (1,000 mLs Intravenous New Bag/Given 10/06/22 1119)    And  lactated ringers bolus 250 mL (250 mLs Intravenous Bolus 10/06/22 1200)  ceFEPIme (MAXIPIME) 2 g in sodium chloride 0.9 %  100 mL  IVPB (0 g Intravenous Stopped 10/06/22 1200)    ED Course/ Medical Decision Making/ A&P                             Medical Decision Making Amount and/or Complexity of Data Reviewed Labs: ordered. Radiology: ordered.  Risk Prescription drug management.   Patient with hypertension seizure-like activity mental status change.  Initially hypoxic to.  On chronic oxygen.  Reviewed notes and has had recurrent seizure-like activity.  Thought potentially be due to hypotension but also on Keppra.  Patient was hypotensive with this episode.  Was also oxygen.  Reviewed neurology notes.  Oxygenation improved.  However x-ray shows potential pneumonia.  With pneumonia and hypoxia and hypotension will treat as a sepsis from pneumonia.  Will give antibiotics with Pseudomonas coverage due to lung issues.  Also fluid boluses to bring up blood pressure  Blood pressure improved.  As has mental status.  Back in apparent baseline.  Lactic acid normal.  However does have increased creatinine.  This could be due to some dehydration due to decreased oral intake and has PEG tube, however with potential infection with pneumonia on x-ray and hypoxia I think we have to treat as a was due to the sepsis/infection.  Reassuring lactic acid is a good marker in terms of severe disease, however did have pressures that were hypotensive.  Will discuss to hospital for admission.  Reviewed neurology notes about seizure-like activity and potentially could have been caused from the hypotension.  Have not increased Keppra from previous dose although level was sent.  CRITICAL CARE Performed by: Davonna Belling Total critical care time: 30 minutes Critical care time was exclusive of separately billable procedures and treating other patients. Critical care was necessary to treat or prevent imminent or life-threatening deterioration. Critical care was time spent personally by me on the following activities: development of treatment plan  with patient and/or surrogate as well as nursing, discussions with consultants, evaluation of patient's response to treatment, examination of patient, obtaining history from patient or surrogate, ordering and performing treatments and interventions, ordering and review of laboratory studies, ordering and review of radiographic studies, pulse oximetry and re-evaluation of patient's condition.        Final Clinical Impression(s) / ED Diagnoses Final diagnoses:  Seizure-like activity ([place])  Sepsis with acute organ dysfunction, due to unspecified organism, unspecified organ dysfunction type, unspecified whether septic shock present [facility])    Rx / DC Orders ED Discharge Orders     None         Davonna Belling, MD 10/06/22 1207

## 2022-10-06 NOTE — Assessment & Plan Note (Addendum)
-  Continue outpatient follow-up with oncology and radiation oncology service -Status post PEG tube placement. -Will minimize/avoid the use of oral route meds. -Okay for patient to have sips and some oral medications after good oral hygiene.

## 2022-10-06 NOTE — H&P (Signed)
History and Physical    Patient: Colin Wagner QMG:[id number] DOB: September 26, 1950 DOA: 10/06/2022 DOS: the patient was seen and examined on 10/06/2022 PCP: [name], MD  Patient coming from: Home  Chief Complaint:  Chief Complaint  Patient presents with   Seizures   HPI: Colin Wagner is a 72 y.o. 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  Patient reports ongoing symptoms of not feeling well along with productive coughing spells.  On the date of admission EMS was contacted as patient was having seizure-like activity and worsening breathing. Per reports soft blood pressure appreciated and saturation in the mid 80s while using chronic 4 L supplementation.  Patient was transported to the hospital for further evaluation and management; workup demonstrating elevated WBCs, tachycardia, positive chest x-ray for right middle lung opacity/bronchitic changes and acute on chronic renal failure.  Fluid resuscitation initiated, cultures taken, antibiotics provided, TRH consulted to place patient in the hospital for further evaluation and management of sepsis due to pneumonia.  Review of Systems: As mentioned in the history of present illness. All other systems reviewed and are negative. Past Medical History:  Diagnosis Date   Alcohol abuse    Alcoholic pancreatitis 3267   admission   Chronic back pain    Chronic pancreatitis ([place])    based on ct findings 2016   COPD (chronic obstructive pulmonary disease) ([place])    Diabetes mellitus    type 2   Diverticulosis    Gastritis    GERD (gastroesophageal  reflux disease)    Headache    History of radiation therapy 05/12/17- 06/22/17   Left cheek and bilateral neck/ 60 Gy in 30 fractions to gross disease   Hyperlipidemia    Hypertension    Jaw cancer ([place])    left jaw part of jaw bone removed   Peptic ulcer disease 1999   Per medical reports, no H pylori   Pneumonia    Renal cancer, left ([place]) 2012   he tells me that he has been released, ?and that he is free of cancer, and never had it to begin with.    Right shoulder pain    Testicular hypofunction    Past Surgical History:  Procedure Laterality Date   BALLOON DILATION  12/31/2021   Procedure: BALLOON DILATION;  Surgeon: [name], MD;  [facility];  Service: Endoscopy;;   BIOPSY  04/30/2022   Procedure: BIOPSY;  Surgeon: Maryjane Hurter, MD;  [facility];  Service: Pulmonary;;   CAROTID STENT  06/2018   COLONOSCOPY  2003   Dr. [name], polyps   COLONOSCOPY  2005   Dr. [name], multiple diverticula   COLONOSCOPY  2008   Dr. [name], diverticulosis   COLONOSCOPY WITH PROPOFOL N/A 10/21/2016   Dr. [name]: Diverticulosis, two 5-7 mm polyps removed. path-tubular adenomas.  Next colonoscopy in 5 years.   COLONOSCOPY WITH PROPOFOL N/A 12/31/2021   Procedure: COLONOSCOPY WITH PROPOFOL;  Surgeon: [name], MD;  [facility];  Service: Endoscopy;  Laterality: N/A;  8:15am   ENDOBRONCHIAL ULTRASOUND Right 04/30/2022   Procedure: ENDOBRONCHIAL ULTRASOUND;  Surgeon: Maryjane Hurter, MD;  [facility];  Service:  Pulmonary;  Laterality: Right;   ESOPHAGEAL BRUSHING  07/26/2022   Procedure: ESOPHAGEAL BRUSHING;  Surgeon: [name], DO;  [facility];  Service: Endoscopy;;   ESOPHAGEAL DILATION N/A 07/26/2022   Procedure: ESOPHAGEAL DILATION;  Surgeon: [name], DO;  [facility];  Service: Endoscopy;  Laterality: N/A;   ESOPHAGOGASTRODUODENOSCOPY     Multiple EGDs. 1999 EGD showed  gastric ulcers, no H pylori and benign biopsies performed by Dr. [name]. 2001 gastric ulcer healed. Last EGD 2005 had gastritis.   ESOPHAGOGASTRODUODENOSCOPY (EGD) WITH PROPOFOL N/A 12/31/2021   narrowing/deformity at UES s/p baloon dilation   ESOPHAGOGASTRODUODENOSCOPY (EGD) WITH PROPOFOL N/A 07/26/2022   Procedure: ESOPHAGOGASTRODUODENOSCOPY (EGD) WITH PROPOFOL;  Surgeon: [name], DO;  [facility];  Service: Endoscopy;  Laterality: N/A;   ESOPHAGOGASTRODUODENOSCOPY (EGD) WITH PROPOFOL N/A 09/03/2022   Procedure: ESOPHAGOGASTRODUODENOSCOPY (EGD) WITH PROPOFOL;  Surgeon: [name], DO;  [facility];  Service: Endoscopy;  Laterality: N/A;  10:15 am   FINE NEEDLE ASPIRATION  04/30/2022   Procedure: FINE NEEDLE ASPIRATION (FNA) LINEAR;  Surgeon: Maryjane Hurter, MD;  [facility];  Service: Pulmonary;;   IR GASTROSTOMY TUBE MOD SED  08/30/2022   IR GASTROSTOMY TUBE REMOVAL  02/08/2020   IR IMAGING GUIDED PORT INSERTION  05/26/2022   IR REPLACE G-TUBE SIMPLE WO FLUORO  03/20/2019   IR REPLACE G-TUBE SIMPLE WO FLUORO  09/21/2019   IR REPLC GASTRO/COLONIC TUBE PERCUT W/FLUORO  05/16/2018   Left partial mandibulectomy, Scapular free flap reconstruction, selective neck dissection, tracheotomy, and resection of intraoral palate cancer. Left 03/01/2017   [facility]   LUNG BIOPSY     MOUTH SURGERY     PARTIAL NEPHRECTOMY Left 2012   POLYPECTOMY  10/21/2016   Procedure: POLYPECTOMY;  Surgeon: [name], MD;  [facility];  Service: Endoscopy;;  hepatic flexure x2   POLYPECTOMY  12/31/2021   Procedure: POLYPECTOMY INTESTINAL;  Surgeon: [name], MD;  [facility];  Service: Endoscopy;;   SURGERY OF LIP  06/2019   lip revision   TRANSURETHRAL RESECTION OF PROSTATE N/A 03/01/2019   Procedure: TRANSURETHRAL RESECTION OF THE PROSTATE (TURP)WITH CYSTOSCOPY;  Surgeon: Raynelle Bring, MD;   [facility];  Service: Urology;  Laterality: N/A;   VIDEO BRONCHOSCOPY Right 04/30/2022   Procedure: VIDEO BRONCHOSCOPY WITHOUT FLUORO;  Surgeon: Maryjane Hurter, MD;  [facility];  Service: Pulmonary;  Laterality: Right;   Social History:  reports that he quit smoking about 5 years ago. His smoking use included cigarettes. He has a 17.50 pack-year smoking history. He has never used smokeless tobacco. He reports that he does not drink alcohol and does not use drugs.  Allergies  Allergen Reactions   Trazodone Other (See Comments)    Seizure    Family History  Problem Relation Age of Onset   Hypertension Mother    Colon cancer Neg Hx     Prior to Admission medications   Medication Sig Start Date End Date Taking? Authorizing Provider  acetaminophen (TYLENOL) 500 MG tablet Take 1,000 mg by mouth every 6 (six) hours as needed for mild pain or moderate pain.   Yes [provider]  albuterol (VENTOLIN HFA) 108 (90 Base) MCG/ACT inhaler Inhale 2 puffs into the lungs every 4 (four) hours as needed for wheezing or shortness of breath. 03/29/22  Yes [provider]  aspirin EC 81 MG tablet Take  1 tablet (81 mg total) by mouth daily with breakfast. 07/27/22  Yes Emokpae, Courage, MD  atorvastatin (LIPITOR) 40 MG tablet Take 1 tablet (40 mg total) by mouth daily. 07/27/22  Yes Emokpae, Courage, MD  cholecalciferol (VITAMIN D) 25 MCG (1000 UNIT) tablet Take 1,000 Units by mouth daily.   Yes [provider]  clopidogrel (PLAVIX) 75 MG tablet Take 1 tablet (75 mg total) by mouth daily. 07/27/22  Yes Roxan Hockey, MD  ferrous sulfate 325 (65 FE) MG tablet Take 1 tablet (325 mg total) by mouth daily with breakfast. 07/27/22  Yes Emokpae, Courage, MD  fluticasone (FLONASE) 50 MCG/ACT nasal spray Place 2 sprays into both nostrils daily.   Yes [provider]  HYDROcodone-acetaminophen (NORCO) 7.5-325 MG tablet Take 1 tablet by mouth every 6 (six) hours as  needed for moderate pain. 06/30/22  Yes [provider]  LEVEMIR FLEXTOUCH 100 UNIT/ML FlexTouch Pen Inject 18 Units into the skin at bedtime. Patient taking differently: Inject 15 Units into the skin at bedtime. 07/27/22  Yes Roxan Hockey, MD  levETIRAcetam (KEPPRA) 100 MG/ML solution Take 7.5 mL (750mg ) twice a day 08/24/22  Yes Cameron Sprang, MD  LORazepam (ATIVAN) 1 MG tablet Take 1 mg by mouth every 6 (six) hours as needed for anxiety or sleep. 03/31/15  Yes [provider]  megestrol (MEGACE) 40 MG/ML suspension Take 10 mLs (400 mg total) by mouth daily. 07/27/22  Yes Emokpae, Courage, MD  metoCLOPramide (REGLAN) 10 MG tablet Take 1 tablet (10 mg total) by mouth every 6 (six) hours as needed (Hiccups.). 07/14/22 07/14/23 Yes Eugenie Filler, MD  metoprolol succinate (TOPROL XL) 100 MG 24 hr tablet Take 1 tablet (100 mg total) by mouth daily. Take with or immediately following a meal. 07/27/22 07/27/23 Yes Emokpae, Courage, MD  ondansetron (ZOFRAN-ODT) 4 MG disintegrating tablet 4 mg every 8 (eight) hours as needed for nausea or vomiting. 09/22/22  Yes [provider]  pantoprazole (PROTONIX) 40 MG tablet Take 1 tablet (40 mg total) by mouth 2 (two) times daily. 07/27/22  Yes Roxan Hockey, MD  senna (SENOKOT) 8.6 MG TABS tablet Take 1 tablet (8.6 mg total) by mouth at bedtime as needed for mild constipation. May be needed as pain medication can constipate. 01/12/17  Yes [name], MD  sildenafil (VIAGRA) 100 MG tablet Take 100 mg by mouth daily as needed for erectile dysfunction.   Yes [provider]  sucralfate (CARAFATE) 1 g tablet Take 1 tablet (1 g total) by mouth 4 (four) times daily -  with meals and at bedtime. May dissolve 1 tab and glass of water and drink as needed 07/27/22  Yes Emokpae, Courage, MD  testosterone cypionate (DEPOTESTOSTERONE CYPIONATE) 200 MG/ML injection Inject 1.5 mLs into the muscle every 14 (fourteen) days. 05/22/22  Yes  [provider]  tiZANidine (ZANAFLEX) 4 MG tablet Take 4 mg by mouth every 6 (six) hours as needed for muscle spasms. 06/30/22  Yes [provider]  umeclidinium-vilanterol (ANORO ELLIPTA) 62.5-25 MCG/ACT AEPB Inhale 1 puff into the lungs daily. 07/27/22  Yes Emokpae, Courage, MD  zolpidem (AMBIEN) 5 MG tablet Take 5 mg by mouth at bedtime as needed for sleep. 10/01/22  Yes [provider]  amLODipine (NORVASC) 10 MG tablet Take 1 tablet (10 mg total) by mouth daily. 07/27/22   Roxan Hockey, MD  Nutritional Supplements (FEEDING SUPPLEMENT, GLUCERNA 1.5 CAL,) LIQD Give 2 cartons 3 times per day (8am. Noon, 8pm).  Flush with 47ml  water before and after each feeding.  Give additional 165ml BID between feedings for additional hydration. Start with 1 carton 3 times per day. Flush with 120 ml before and after each feeding. 08/30/22   Heilingoetter, Tobe Sos, PA-C    Physical Exam: Vitals:   10/06/22 1115 10/06/22 1130 10/06/22 1145 10/06/22 1159  BP: 92/64 97/66 114/79   Pulse: 84 84 87   Resp: (!) 25 (!) 23 (!) 22   Temp:      TempSrc:      SpO2: 90% 98% 100%   Weight:    67.2 kg  Height:    5\' 7"  (1.702 m)   General exam: Afebrile, in no acute distress and denying chest pain.  Chronically ill in appearance. Respiratory system: Positive tachycardia apnea and rhonchi appreciated ; No using accessory muscles. Cardiovascular system:RRR. No rubs or gallops. Gastrointestinal system: Abdomen is nondistended, soft and nontender.  Positive bowel sounds appreciated; PEG tube in place. Central nervous system: No focal neurological deficits.  Following commands appropriately. Extremities: No cyanosis or clubbing. Skin: No AKI. Psychiatry: Mood & affect appropriate.   Data Reviewed: Respiratory panel by PCR and negative for COVID, RSV and influenza Comprehensive metabolic panel: Sodium 295, potassium 5.0, chloride 90, bicarb 31, blood sugar 312, BUN 69, creatinine 2.44,  normal LFTs MWU:XLKGM blood cells 16.5, hemoglobin 10.5 and platelets count 272 K Lactic acid: 1.9 Chest x-ray: Acute bronchitic changes with right middle lobe infiltrate consistent with pneumonia.  Assessment and Plan: * Sepsis due to pneumonia ([place]) -In the setting of aspiration pneumonia -IV Unasyn will be provided -Maintain adequate hydration -Follow clinical response. -Patient COVID 19, RSV and influenza negative. -Follow WBCs trend, check CRP and ESR -Lactic acid reassuring.  Aspiration pneumonia ([place]) -Patient with underlying history of seizures/seizure-like activity with presentation of right middle on new opacity, bronchitic changes and worsening hypoxia. -Most likely in the setting of aspiration -Continue IV antibiotics and follow clinical response -Bronchodilator management will be provided. -Wean off oxygen supplementation back to baseline.  Acute on chronic respiratory failure with hypoxia ([facility]) -Patient chronically using around 4 L nasal cannula supplementation -Requiring higher level of oxygen supplementation due to hypoxia in the setting of pneumonia; appears to be aspiration in nature (chest x-ray and with right midlung opacity and bronchitic changes) -Provide bronchodilator management and flutter valve -Follow expectorated and blood culture results, checking Streptococcus and Legionella antigen in urine -IV Unasyn as patient's antibiotic of choice per pharmacy will be provided -Continue fluid resuscitation and supportive care  Seizure [facility]) -Continue the use of Keppra -Patient is actively following with neurology as an outpatient -Recent MRI images demonstrated no acute focal abnormality.  GERD (gastroesophageal reflux disease) -Continue PPI.  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 -Will minimize/avoid the use of oral route meds.  Hyponatremia -Appears to be chronic; but worsened in the setting of dehydration -Provide fluid resuscitation and follow electrolytes.  Hypertension, uncontrolled -Initial presentation of soft blood pressure appreciated; holding home antihypertensive agents at the moment -Will provide fluid resuscitation -Follow-up vital signs.  DM type 2 (diabetes mellitus, type 2) ([place]) -Holding oral hypoglycemic agents while inpatient -Update A1c -Sliding scale insulin and Semglee will be  provided -Modified carbohydrate diet ordered.      Advance Care Planning:   Code Status: Prior   Consults: None  Family Communication: No family at bedside.  Severity of Illness: The appropriate patient status for this patient is INPATIENT. Inpatient status is judged to be reasonable and necessary in order to provide the required intensity of service to ensure the patient's safety. The patient's presenting symptoms, physical exam findings, and initial radiographic and laboratory data in the context of their chronic comorbidities is felt to place them at high risk for further clinical deterioration. Furthermore, it is not anticipated that the patient will be medically stable for discharge from the hospital within 2 midnights of admission.   * I certify that at the point of admission it is my clinical judgment that the patient will require inpatient hospital care spanning beyond 2 midnights from the point of admission due to high intensity of service, high risk for further deterioration and high frequency of surveillance required.*  Author: Barton Dubois, MD 10/06/2022 12:34 PM  For on call review [email or web address].

## 2022-10-06 NOTE — ED Notes (Signed)
Both sets of blood cultures drawn before antibiotic administration

## 2022-10-06 NOTE — Assessment & Plan Note (Addendum)
-  Initial presentation of soft blood pressure appreciated, most like a associated with continued use of home antihypertensive agents and sepsis physiology. -Improved, stabilized and rising at time of discharge -Continue adjusted dose of home metoprolol -Patient advised to maintain adequate hydration -Reassess blood pressure at follow-up visit.

## 2022-10-06 NOTE — Assessment & Plan Note (Addendum)
-  Patient with underlying history of seizures/seizure-like activity with presentation of right middle lobe new opacity, bronchitic changes and worsening hypoxia. -Adequately treated using IV Unasyn and subsequently transition to Augmentin suspension through PEG tube to complete antibiotic therapy. -repeat CXR in 6 weeks to assure resolution of infiltrates.

## 2022-10-06 NOTE — ED Triage Notes (Signed)
Pt arrived via [facility] c/o seizure-like activity. Per EMS, pt was hypotensive and hypoxic, BP initially 50s/20s and HR 105, SpO2 mid 80s on 4L =--pt on 4 L at baseline. EMS olaced on 10 L and SpO2 went up to 90s, Hx of lung cancer cbg 330

## 2022-10-06 NOTE — Assessment & Plan Note (Addendum)
-  In the setting of aspiration pneumonia -Patient met sepsis criteria at time of admission; resolved at time of discharge. -Patient's COVID 19, RSV and influenza PCR were negative. -Continue to maintain adequate hydration; providing free water through PEG tube). -Complete antibiotic therapy at time of discharge using Augmentin suspension through PEG tube. -Patient's oxygen supplementation back to baseline (4 L through nasal cannula) saturation in the mid 90s to 100 range.

## 2022-10-06 NOTE — Assessment & Plan Note (Signed)
Continue PPI ?

## 2022-10-06 NOTE — Assessment & Plan Note (Addendum)
-  Appears to be chronic; but worsened in the setting of dehydration -Stable and back to baseline -Continue to follow electrolytes trend.

## 2022-10-06 NOTE — Assessment & Plan Note (Addendum)
-  Patient chronically using around 3-4 L nasal cannula supplementation -With acute hypoxemic process driven by aspiration pneumonia -Improved and with saturation back to baseline at discharge -Complete antibiotics as instructed and follow-up with PCP as recommended.

## 2022-10-06 NOTE — Assessment & Plan Note (Signed)
-  Continue tube feedings -Maintain adequate hydration

## 2022-10-06 NOTE — Assessment & Plan Note (Signed)
-  Continue the use of Keppra -Patient is actively following with neurology as an outpatient. -Recent MRI images demonstrated no acute focal abnormality.

## 2022-10-06 NOTE — Progress Notes (Signed)
Pharmacy Antibiotic Note  Colin Wagner is a 72 y.o. male admitted on 10/06/2022 with potential seizure activity. Pharmacy has been consulted on Unasyn dosing for sepsis.  Pt presented to ED morning of 2/14 with potential seizure activity. Pt was hypotensive and hypoxic with BP 50s/20s, and HR 105. Pt on 4 L at baseline. SpO2 in mid-80s on 4L but increased to 90s following placement on 10 L by EMS and remained at 99% upon arrival to ED.  Blood and urine cultures pending. Beginning empiric treatment with Unasyn for sepsis.  Plan: Initiate Unasyn 3 g IV every 12 hours. Follow-up on culture status. Follow-up on treatment duration and potential de-escalation.  Height: 5\' 7"  (170.2 cm) Weight: 67.2 kg (148 lb 2.4 oz) IBW/kg (Calculated) : 66.1  Temp (24hrs), Avg:97.7 F (36.5 C), Min:97.7 F (36.5 C), Max:97.7 F (36.5 C)  Recent Labs  Lab 10/06/22 1120  WBC 16.5*  CREATININE 2.44*  LATICACIDVEN 1.9    Estimated Creatinine Clearance: 25.6 mL/min (A) (by C-G formula based on SCr of 2.44 mg/dL (H)).    Allergies  Allergen Reactions   Trazodone Other (See Comments)    Seizure    Antimicrobials this admission: azithromycin 2/14 >> 2/14 cefepime 2/14 >> 2/14 Unasyn 2/14 >>  Microbiology results: 2/14 BCx: pending 2/14 influenza A by PCR: negative 2/14 influenza B by PCR: negative 2/14 RSV by PCR: negative SARS Coronavirus 2 by RT PCR: negative  Thank you for allowing pharmacy to be a part of this patient's care.  Charlett Lango 10/06/2022 1:22 PM

## 2022-10-07 DIAGNOSIS — A419 Sepsis, unspecified organism: Secondary | ICD-10-CM | POA: Diagnosis not present

## 2022-10-07 DIAGNOSIS — E1165 Type 2 diabetes mellitus with hyperglycemia: Secondary | ICD-10-CM | POA: Diagnosis not present

## 2022-10-07 DIAGNOSIS — R569 Unspecified convulsions: Secondary | ICD-10-CM | POA: Diagnosis not present

## 2022-10-07 DIAGNOSIS — K219 Gastro-esophageal reflux disease without esophagitis: Secondary | ICD-10-CM | POA: Diagnosis not present

## 2022-10-07 DIAGNOSIS — E871 Hypo-osmolality and hyponatremia: Secondary | ICD-10-CM

## 2022-10-07 DIAGNOSIS — Z794 Long term (current) use of insulin: Secondary | ICD-10-CM

## 2022-10-07 DIAGNOSIS — J189 Pneumonia, unspecified organism: Secondary | ICD-10-CM | POA: Diagnosis not present

## 2022-10-07 LAB — BASIC METABOLIC PANEL
Anion gap: 8 (ref 5–15)
BUN: 51 mg/dL — ABNORMAL HIGH (ref 8–23)
CO2: 31 mmol/L (ref 22–32)
Calcium: 9.1 mg/dL (ref 8.9–10.3)
Chloride: 95 mmol/L — ABNORMAL LOW (ref 98–111)
Creatinine, Ser: 1.75 mg/dL — ABNORMAL HIGH (ref 0.61–1.24)
GFR, Estimated: 41 mL/min — ABNORMAL LOW (ref >60)
Glucose, Bld: 369 mg/dL — ABNORMAL HIGH (ref 70–99)
Potassium: 4.7 mmol/L (ref 3.5–5.1)
Sodium: 134 mmol/L — ABNORMAL LOW (ref 135–145)

## 2022-10-07 LAB — CBC
HCT: 32.6 % — ABNORMAL LOW (ref 39.0–52.0)
Hemoglobin: 10.2 g/dL — ABNORMAL LOW (ref 13.0–17.0)
MCH: 28.1 pg (ref 26.0–34.0)
MCHC: 31.3 g/dL (ref 30.0–36.0)
MCV: 89.8 fL (ref 80.0–100.0)
Platelets: 255 10*3/uL (ref 150–400)
RBC: 3.63 MIL/uL — ABNORMAL LOW (ref 4.22–5.81)
RDW: 16.4 % — ABNORMAL HIGH (ref 11.5–15.5)
WBC: 11.8 10*3/uL — ABNORMAL HIGH (ref 4.0–10.5)
nRBC: 0 % (ref 0.0–0.2)

## 2022-10-07 LAB — C-REACTIVE PROTEIN: CRP: 15.9 mg/dL — ABNORMAL HIGH (ref <1.0)

## 2022-10-07 LAB — GLUCOSE, CAPILLARY
Glucose-Capillary: 109 mg/dL — ABNORMAL HIGH (ref 70–99)
Glucose-Capillary: 122 mg/dL — ABNORMAL HIGH (ref 70–99)
Glucose-Capillary: 149 mg/dL — ABNORMAL HIGH (ref 70–99)
Glucose-Capillary: 153 mg/dL — ABNORMAL HIGH (ref 70–99)
Glucose-Capillary: 286 mg/dL — ABNORMAL HIGH (ref 70–99)
Glucose-Capillary: 398 mg/dL — ABNORMAL HIGH (ref 70–99)
Glucose-Capillary: 68 mg/dL — ABNORMAL LOW (ref 70–99)

## 2022-10-07 LAB — MAGNESIUM: Magnesium: 2.4 mg/dL (ref 1.7–2.4)

## 2022-10-07 LAB — LEVETIRACETAM LEVEL: Levetiracetam Lvl: 11.6 ug/mL (ref 10.0–40.0)

## 2022-10-07 LAB — PHOSPHORUS: Phosphorus: 3.2 mg/dL (ref 2.5–4.6)

## 2022-10-07 MED ORDER — SODIUM CHLORIDE 0.9 % IV SOLN
3.0000 g | Freq: Three times a day (TID) | INTRAVENOUS | Status: DC
  Administered 2022-10-07 – 2022-10-09 (×8): 3 g via INTRAVENOUS
  Filled 2022-10-07 (×7): qty 8

## 2022-10-07 MED ORDER — LACTATED RINGERS IV SOLN: INTRAVENOUS | Status: AC

## 2022-10-07 MED ORDER — SENNA 8.6 MG PO TABS: 1.0000 | ORAL_TABLET | Freq: Every evening | ORAL | Status: DC | PRN

## 2022-10-07 MED ORDER — ACETAMINOPHEN 325 MG PO TABS
650.0000 mg | ORAL_TABLET | Freq: Four times a day (QID) | ORAL | Status: DC | PRN
  Administered 2022-10-07 – 2022-10-09 (×4): 650 mg
  Filled 2022-10-07 (×3): qty 2

## 2022-10-07 MED ORDER — ASPIRIN 81 MG PO CHEW
81.0000 mg | CHEWABLE_TABLET | Freq: Every day | ORAL | Status: DC
  Administered 2022-10-07 – 2022-10-09 (×3): 81 mg
  Filled 2022-10-07 (×3): qty 1

## 2022-10-07 MED ORDER — GLUCERNA 1.2 CAL PO LIQD
1000.0000 mL | ORAL | Status: DC
  Administered 2022-10-07: 1000 mL

## 2022-10-07 MED ORDER — PROSOURCE TF20 ENFIT COMPATIBL EN LIQD: 60.0000 mL | Freq: Every day | ENTERAL | Status: DC

## 2022-10-07 MED ORDER — ACETAMINOPHEN 650 MG RE SUPP: 650.0000 mg | Freq: Four times a day (QID) | RECTAL | Status: DC | PRN

## 2022-10-07 MED ORDER — FREE WATER
30.0000 mL | Status: DC
  Administered 2022-10-07 – 2022-10-09 (×13): 30 mL

## 2022-10-07 MED ORDER — INSULIN ASPART 100 UNIT/ML IJ SOLN
0.0000 [IU] | INTRAMUSCULAR | Status: DC
  Administered 2022-10-07: 1 [IU] via SUBCUTANEOUS
  Administered 2022-10-07: 5 [IU] via SUBCUTANEOUS
  Administered 2022-10-08 (×3): 1 [IU] via SUBCUTANEOUS
  Administered 2022-10-08 (×2): 2 [IU] via SUBCUTANEOUS
  Administered 2022-10-09: 1 [IU] via SUBCUTANEOUS
  Administered 2022-10-09: 2 [IU] via SUBCUTANEOUS
  Administered 2022-10-09 (×2): 1 [IU] via SUBCUTANEOUS

## 2022-10-07 MED ORDER — GLUCERNA 1.5 CAL PO LIQD
474.0000 mL | ORAL | Status: DC
  Filled 2022-10-07 (×4): qty 474

## 2022-10-07 MED ORDER — INSULIN GLARGINE-YFGN 100 UNIT/ML ~~LOC~~ SOLN
18.0000 [IU] | Freq: Every day | SUBCUTANEOUS | Status: DC
  Administered 2022-10-07 – 2022-10-09 (×3): 18 [IU] via SUBCUTANEOUS
  Filled 2022-10-07 (×5): qty 0.18

## 2022-10-07 MED ORDER — HYDROCODONE-ACETAMINOPHEN 7.5-325 MG/15ML PO SOLN
10.0000 mL | Freq: Four times a day (QID) | ORAL | Status: DC | PRN
  Administered 2022-10-07: 10 mL
  Filled 2022-10-07: qty 15

## 2022-10-07 MED ORDER — DEXTROSE 50 % IV SOLN
12.5000 g | Freq: Once | INTRAVENOUS | Status: AC
  Administered 2022-10-07: 12.5 g via INTRAVENOUS
  Filled 2022-10-07: qty 50

## 2022-10-07 MED ORDER — CLOPIDOGREL BISULFATE 75 MG PO TABS
75.0000 mg | ORAL_TABLET | Freq: Every day | ORAL | Status: DC
  Administered 2022-10-07 – 2022-10-09 (×3): 75 mg
  Filled 2022-10-07 (×3): qty 1

## 2022-10-07 MED ORDER — VITAMIN D 25 MCG (1000 UNIT) PO TABS
1000.0000 [IU] | ORAL_TABLET | Freq: Every day | ORAL | Status: DC
  Administered 2022-10-07 – 2022-10-09 (×3): 1000 [IU]
  Filled 2022-10-07 (×3): qty 1

## 2022-10-07 MED ORDER — ATORVASTATIN CALCIUM 40 MG PO TABS
40.0000 mg | ORAL_TABLET | Freq: Every day | ORAL | Status: DC
  Administered 2022-10-07 – 2022-10-09 (×3): 40 mg
  Filled 2022-10-07 (×3): qty 1

## 2022-10-07 MED ORDER — ENOXAPARIN SODIUM 40 MG/0.4ML IJ SOSY
40.0000 mg | PREFILLED_SYRINGE | INTRAMUSCULAR | Status: DC
  Administered 2022-10-07 – 2022-10-09 (×3): 40 mg via SUBCUTANEOUS
  Filled 2022-10-07 (×3): qty 0.4

## 2022-10-07 MED ORDER — FREE WATER: 150.0000 mL | Status: DC

## 2022-10-07 MED ORDER — INSULIN ASPART 100 UNIT/ML IJ SOLN
4.0000 [IU] | Freq: Three times a day (TID) | INTRAMUSCULAR | Status: DC
  Administered 2022-10-08 (×2): 4 [IU] via SUBCUTANEOUS

## 2022-10-07 NOTE — Progress Notes (Signed)
Hypoglycemic Event  CBG: 68 mg/mL  Treatment: D50 25 mL (12.5 gm)  Symptoms: None  Follow-up CBG: Time:1742 CBG Result:149 mg/dL  Possible Reasons for Event: Unknown  Comments/MD notified:Patients blood glucose 68 mg/dL. Patient reported no s/s of hypoglycemia. Patient given D50 25 mL via IV. Rechecked blood glucose was 149 mg/dL. MD Dyann Kief made aware.    Ginger Organ

## 2022-10-07 NOTE — Inpatient Diabetes Management (Addendum)
Inpatient Diabetes Program Recommendations  AACE/ADA: New Consensus Statement on Inpatient Glycemic Control   Target Ranges:  Prepandial:   less than 140 mg/dL      Peak postprandial:   less than 180 mg/dL (1-2 hours)      Critically ill patients:  140 - 180 mg/dL    Latest Reference Range & Units 10/06/22 10:32 10/06/22 16:33 10/07/22 07:29  Glucose-Capillary 70 - 99 mg/dL 365 (H) 257 (H) 398 (H)    Latest Reference Range & Units 10/06/22 11:20  Glucose 70 - 99 mg/dL 312 (H)   Review of Glycemic Control  Diabetes history: DM2 Outpatient Diabetes medications: Levemir 15 units QHS, Osmolite 2 cartons 3 times a day (8am, 12pm, 8pm) Current orders for Inpatient glycemic control: Semglee 18 units daily, Novolog 0-9 units Q4H; Glucerna 474 ml TID (8:00, 13:00, 18:00)  Inpatient Diabetes Program Recommendations:    Insulin: Noted Semglee increased from 12 to 18 units daily today. RD notes to change TF to Glucerna 2 cans TID (8:00, 13:00, 18:00). Please consider ordering Novolog 4 units TID scheduled along with TF bolus (8:00, 13:00, 18:00).   Addendum 10/07/22@13 :00-Spoke with patient at bedside. Patient is difficult to understand but was able to provide answers to questions with 1-4 words.  Patient confirms that he is taking Levemir 15 units QHS for DM. Patient reports that he checks glucose at home and glucose is usually in 100's mg/dl. Patient states he is not able to have PO intake and that he has been taking tube feeding at home 3 times a day.  Discussed glucose trends as an inpatient and noted that RD has made recommendation to do tube feeding bolus 3 times a day. Explained that Dr. Dyann Kief has already made some changes with insulin regimen today but it will be recommended that he receive Novolog coverage for tube feeding bolus. Patient verbalized understanding of information discussed and he states he has no questions at this time.  Thanks, Barnie Alderman, RN, MSN, [facility]  Coordinator Inpatient Diabetes Program [phone] (Team Pager from 8am to [place])

## 2022-10-07 NOTE — TOC Initial Note (Signed)
Transition of Care [facility]) - Initial/Assessment Note    Patient Details  Name: Colin Wagner MRN: [id number] Date of Birth: July 13, 1951  Transition of Care [facility]) CM/SW Contact:    Shade Flood, LCSW Phone Number: 10/07/2022, 2:15 PM  Clinical Narrative:                  Pt admitted from home. He has a high readmission risk score. Spoke with pt today to assess. Pt reports that he plans to return home at dc. He does not use any DME for ambulation. Pt does have home O2 and is on 3-4 L at baseline. Per pt, he is able to get himself to appointments and is able to obtain medications.  Pt only has Medicaid even though he is Medicare Wagner. Pt either did not apply or did not qualify.  Pt is not anticipating any new TOC needs for dc. Will follow and assist if needed.  Expected Discharge Plan: Home/Self Care Barriers to Discharge: Continued Medical Work up   Patient Goals and CMS Choice Patient states their goals for this hospitalization and ongoing recovery are:: go home          Expected Discharge Plan and Services In-house Referral: Clinical Social Work     Living arrangements for the past 2 months: Single Family Home                                      Prior Living Arrangements/Services Living arrangements for the past 2 months: Single Family Home Lives with:: Self Patient language and need for interpreter reviewed:: Yes Do you feel safe going back to the place where you live?: Yes      Need for Family Participation in Patient Care: No (Comment)   Current home services: DME Criminal Activity/Legal Involvement Pertinent to Current Situation/Hospitalization: No - Comment as needed  Activities of Daily Living Home Assistive Devices/Equipment: Bedside commode/3-in-1, Walker (specify type), Cane (specify quad or straight), Wheelchair, Oxygen ADL Screening (condition at time of admission) Patient's cognitive ability adequate to safely complete daily activities?:  Yes Is the patient deaf or have difficulty hearing?: No Does the patient have difficulty seeing, even when wearing glasses/contacts?: No Does the patient have difficulty concentrating, remembering, or making decisions?: No Patient able to express need for assistance with ADLs?: Yes Does the patient have difficulty dressing or bathing?: Yes Independently performs ADLs?: No Communication: Independent Dressing (OT): Needs assistance Is this a change from baseline?: Pre-admission baseline Grooming: Needs assistance Is this a change from baseline?: Pre-admission baseline Feeding: Needs assistance Is this a change from baseline?: Pre-admission baseline Bathing: Needs assistance Is this a change from baseline?: Pre-admission baseline Toileting: Needs assistance Is this a change from baseline?: Pre-admission baseline In/Out Bed: Needs assistance Is this a change from baseline?: Pre-admission baseline Does the patient have difficulty walking or climbing stairs?: Yes Weakness of Legs: Both Weakness of Arms/Hands: None  Permission Sought/Granted                  Emotional Assessment   Attitude/Demeanor/Rapport: Engaged Affect (typically observed): Pleasant Orientation: : Oriented to Self, Oriented to Place, Oriented to  Time, Oriented to Situation Alcohol / Substance Use: Not Applicable Psych Involvement: No (comment)  Admission diagnosis:  Seizure-like activity ([place]) [R56.9] Acute on chronic respiratory failure with hypoxia ([place]) [J96.21] Sepsis with acute organ dysfunction, due to unspecified organism, unspecified organ dysfunction type, unspecified whether  septic shock present ([place]) [A41.9, R65.20] Patient Active Problem List   Diagnosis Date Noted   Acute on chronic respiratory failure with hypoxia ([place]) 10/06/2022   Aspiration pneumonia ([place]) 10/06/2022   Sepsis due to pneumonia ([place]) 10/06/2022   Encounter for antineoplastic immunotherapy 07/28/2022   Failure to thrive in  adult 07/26/2022   Loss of weight 07/26/2022   Esophageal dysphagia 07/26/2022   Acute GI bleeding 07/26/2022   Hypoglycemia 07/25/2022   Seizure ([place]) 07/13/2022   Mild protein malnutrition ([place]) 07/13/2022   Leukocytopenia 07/13/2022   Normocytic anemia 07/13/2022   Abnormal MRI of the head 07/13/2022   Encounter for antineoplastic chemotherapy 05/20/2022   Primary malignant neoplasm of right upper lobe of lung ([place]) 05/17/2022   GERD (gastroesophageal reflux disease)    IDA (iron deficiency anemia)    GI bleed 12/04/2019   Anemia    Rectal bleed 11/05/2019   Acute respiratory failure with hypoxia ([place]) 07/29/2019   Pneumonia due to COVID-19 virus 07/28/2019   Acute renal failure superimposed on stage 3b chronic kidney disease ([place]) 07/27/2019   Hyperkalemia 07/27/2019   COVID-19 virus infection 07/27/2019   Urinary retention due to benign prostatic hyperplasia 03/01/2019   Attention to G-tube ([place]) 02/08/2018   UTI (urinary tract infection) 04/20/2017   UTI due to Klebsiella species 04/20/2017   Essential hypertension 04/20/2017   Chronic pancreatitis ([place]) 04/20/2017   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/2017   Malnutrition of moderate degree 50/04/3817   Complicated UTI (urinary tract infection)    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 [place] [address] Phone: [phone] Fax: [phone]     Social Determinants of  Health (SDOH) Social History: SDOH Screenings   Food Insecurity: No Food Insecurity (10/06/2022)  Housing: Low Risk  (10/06/2022)  Transportation Needs: No Transportation Needs (10/06/2022)  Utilities: Not At Risk (10/06/2022)  Financial Resource Strain: High Risk (06/03/2022)  Physical Activity: Inactive (07/27/2019)  Social Connections: Unknown (07/27/2019)  Stress: No Stress Concern Present (07/27/2019)  Tobacco Use: Medium Risk (09/10/2022)   SDOH Interventions:     Readmission Risk Interventions    10/07/2022    2:14 PM  Readmission Risk Prevention Plan  Transportation Screening Complete  Medication Review (RN Care Manager) Complete  HRI or [place] Complete  SW Recovery Care/Counseling Consult Complete  Palliative Care Screening Not [place] Not Applicable

## 2022-10-07 NOTE — Progress Notes (Signed)
Patient had uneventful night, requested PRN for headache at 2220, PRN effective , patient reported headache resolved. G-tube flushed and patent infusing Osmolite 1.5 @ 60 ml with 240cc H2O flush @ 6 hours.. Patient resting quietly with yankuer suction at bedside, patient suctions self PRN.

## 2022-10-07 NOTE — Progress Notes (Signed)
Progress Note   Patient: Colin Wagner [id number] DOB: 1951/03/10 DOA: 10/06/2022     1 DOS: the patient was seen and examined on 10/07/2022   Brief hospital course: ADIN LAKER is a 72 y.o. 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  Patient reports ongoing symptoms of not feeling well along with productive coughing spells.  On the date of admission EMS was contacted as patient was having seizure-like activity and worsening breathing. Per reports soft blood pressure appreciated and saturation in the mid 80s while using chronic 4 L supplementation.   Patient was transported to the hospital for further evaluation and management; workup demonstrating elevated WBCs, tachycardia, positive chest x-ray for right middle lung opacity/bronchitic changes and acute on chronic renal failure.   Fluid resuscitation initiated, cultures taken, antibiotics provided, TRH consulted to place patient in the hospital for further evaluation and management of sepsis due to pneumonia.  Assessment and Plan: * Sepsis due to pneumonia ([place]) -In the setting of aspiration pneumonia -Patient met sepsis criteria at time of admission -Patient COVID 54, RSV and influenza negative. -Continue to maintain adequate hydration -Continue current IV antibiotics using Unasyn -Continue to wean down oxygen supplementation to baseline as tolerated.  Aspiration pneumonia ([place]) -Patient with underlying history of seizures/seizure-like activity with presentation of right middle lobe new opacity, bronchitic changes and worsening hypoxia. -Most  likely in the setting of aspiration -Continue IV unasyn -repeat CXR in 6 weeks to assure resolution of infiltrates.   Acute on chronic respiratory failure with hypoxia (H[facility]) -Patient chronically using around 3-4 L nasal cannula supplementation -With acute hypoxemic process driven by aspiration pneumonia -Continue current IV antibiotics, bronchodilator management and the use of flutter valve/antitussive medications. -Wean down oxygen supplementation to baseline as tolerated. -Continue following clinical response.  Seizure ([place]) -Continue the use of Keppra -Patient is actively following with neurology as an outpatient. -Recent MRI images demonstrated no acute focal abnormality.  GERD (gastroesophageal reflux disease) -Continue PPI.  Acute renal failure superimposed on stage 3b chronic kidney disease ([place]) -In the setting of prerenal azotemia and continue use of nephrotoxic agents. -Improved and pretty much back to baseline currently -Continue to follow electrolytes and renal function trend -Continue minimizing nephrotoxic agent -Continue to maintain adequate hydration. -Will recommend institution of free water through tube feedings.   Malnutrition of moderate degree -Continue tube feedings -Appreciate assistance and recommendation by nutritional service. 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 -Will minimize/avoid the use of oral route meds. -Patient expressed sleeping and taking some of his medications orally without problems at home.  Hyponatremia -Appears to be chronic; but worsened in the setting of dehydration -Continue to follow electrolytes trend and further replete as needed.  Hypertension, uncontrolled -Initial presentation of soft blood pressure appreciated -Improving and rising after fluid resuscitation -Planning for  start antihypertensive agents resumption. -Continue to follow vital signs.  DM type 2 (diabetes mellitus, type 2) ([place]) -Holding oral hypoglycemic agents while inpatient -Hyperglycemia appreciated most likely in the use of tube feedings -Patient long-acting and coverage for tube feeding boluses will be provided -Continue to follow  CBGs and further adjust hypoglycemic regimen as needed. -A1c 8.9   Subjective:  No chest pain, no nausea, no vomiting.  Reports feeling slightly better.  Currently afebrile.  Still with intermittent coughing spells and short winded sensation with activity.  5 L nasal cannula supplementation in place.  Elevated CBGs while receiving tube feedings appreciated.  Physical Exam: Vitals:   10/06/22 2138 10/07/22 0517 10/07/22 0841 10/07/22 1233  BP: 131/83 (!) 150/95  136/73  Pulse: 91 94  90  Resp: 20 18  18   Temp: 98.4 F (36.9 C) 98.2 F (36.8 C)  98.7 F (37.1 C)  TempSrc: Oral Oral  Oral  SpO2: 100% 99% 100% 98%  Weight:      Height:       General exam: Alert, awake, oriented x 3; reports feeling better and breathing slightly easier.  Still reporting intermittent coughing spells and using 5 L nasal cannula supplementation at time of evaluation. Respiratory system: Positive rhonchi bilaterally; no expiratory wheezing.  No using accessory muscles.  Stable respiratory rate. Cardiovascular system:RRR. No rubs or gallops; no JVD. Gastrointestinal system: Abdomen is nondistended, soft and nontender. No organomegaly or masses felt. Normal bowel sounds heard. Central nervous system: No focal neurological deficits.  No seizure activity has been appreciated. Extremities: No cyanosis or clubbing. Skin: No petechiae. Psychiatry: Judgement and insight appear normal. Mood & affect appropriate.   Data Reviewed: CBGs: 365>> 257>> 398>> 286 Magnesium: 2.4 [id number]:5.3 Basic metabolic panel:  Sodium 005, potassium 4.7, chloride 95, bicarb 31, BUN 51 and creatinine  1.75 CBC: WBCs 11.8, hemoglobin 10.2 and platelet count 255 K.  Family Communication: No family at bedside at time of evaluation; patient's POA updated over the phone  Disposition: Status is: Inpatient Remains inpatient appropriate because: Continue IV antibiotics.   Planned Discharge Destination: Home  Time spent: 35 minutes  Author: Barton Dubois, MD 10/07/2022 4:29 PM  For on call review [email or web address].

## 2022-10-07 NOTE — Progress Notes (Addendum)
Initial Nutrition Assessment  DOCUMENTATION CODES:   Not applicable  INTERVENTION:   ADDENDUM: AP facility is currently out of Glucerna 1.5. Due to high amount of calories that the pt needs, RD recommends continuous feeds of Glucerna 1.2 until pt is ready for discharge or until Glucerna 1.5 cans are stocked.   -Initiate Glucerna 1.2 @ 20 mL/hr and advance by 10 mL/hr Q4H until goal rate of 75 mL/hr; This will provide 2160 kcals, 108 gm protein, and 1449 mL free water.  - FWF of 30 Q4H maintenance flush for now.   Recommend the following orders for discharge:  - Initiate bolus feeds of Glucerna 1.5 (6 cans per day) : Bolus 2 cans TID (0800; 1300; 1800).  - Flush tube with 150 mL free water 3x daily (75 mL before and after each feeding)  - This will provide the pt with 2133 kcals, 117 gm protein and 1529 mL free water.   NUTRITION DIAGNOSIS:   Inadequate oral intake related to chronic illness, inability to eat as evidenced by percent weight loss, meal completion < 25%.  GOAL:   Patient will meet greater than or equal to 90% of their needs  MONITOR:   PO intake, TF tolerance  REASON FOR ASSESSMENT:   Consult Enteral/tube feeding initiation and management  ASSESSMENT:   72 y.o. male admits related to seizures. PMH includes: GERD, HTN, HLD, hx of jaw cancer s/p PEG placement, T2DM, COPD, chronic resp failure, chronic pancreatitis. Pt is currently receiving medical management related to sepsis in setting of aspiration pneumonia.  Meds reviewed: lipitor, Vit D3, sliding scale insulin, semglee (18 units). Labs reviewed: Na low, Chloride low, BUN/Creatinine high. FS BG 257-398 mg/dL. HgA1c 8.9%.   MD consult related to EN management. RD spoke with RN who reports that the pt is currently receiving continuous feeds of Osmolite 1.5. RD called pt's room and spoke briefly with pt but he was difficult to understand and only able to provide yes or no answers. RD unable to gather detailed  nutrition hx. However, per record, pt has experienced a 17% wt loss in less than 3 months which is significant. Pt currently has a heart healthy carb mod diet ordered but RN reports that he is not eating anything at this point.   Pt's blood sugars are poorly controlled. RD will modify TF regimen to Glucerna 1.5. Per outpatient RD notes, pt was receiving Glucerna 1.5 at home. Will continue home regimen.   NUTRITION - FOCUSED PHYSICAL EXAM:  Unable to assess at this time due to remote assessment.   Diet Order:   Diet Order             Diet heart healthy/carb modified Room service appropriate? Yes; Fluid consistency: Thin  Diet effective now                   EDUCATION NEEDS:   Not appropriate for education at this time  Skin:  Skin Assessment: Reviewed RN Assessment  Last BM:  2/13  Height:   Ht Readings from Last 1 Encounters:  10/06/22 5\' 11"  (1.803 m)    Weight:   Wt Readings from Last 1 Encounters:  10/06/22 64.4 kg    Ideal Body Weight:     BMI:  Body mass index is 19.8 kg/m.  Estimated Nutritional Needs:   Kcal:  1930-2250 kcals  Protein:  95-115 gm  Fluid:  >/= 1.9 L  Thalia Bloodgood, RD, LDN, CNSC.

## 2022-10-08 DIAGNOSIS — E1165 Type 2 diabetes mellitus with hyperglycemia: Secondary | ICD-10-CM | POA: Diagnosis not present

## 2022-10-08 DIAGNOSIS — J189 Pneumonia, unspecified organism: Secondary | ICD-10-CM | POA: Diagnosis not present

## 2022-10-08 DIAGNOSIS — R569 Unspecified convulsions: Secondary | ICD-10-CM | POA: Diagnosis not present

## 2022-10-08 DIAGNOSIS — K219 Gastro-esophageal reflux disease without esophagitis: Secondary | ICD-10-CM | POA: Diagnosis not present

## 2022-10-08 LAB — GLUCOSE, CAPILLARY
Glucose-Capillary: 116 mg/dL — ABNORMAL HIGH (ref 70–99)
Glucose-Capillary: 122 mg/dL — ABNORMAL HIGH (ref 70–99)
Glucose-Capillary: 138 mg/dL — ABNORMAL HIGH (ref 70–99)
Glucose-Capillary: 140 mg/dL — ABNORMAL HIGH (ref 70–99)
Glucose-Capillary: 146 mg/dL — ABNORMAL HIGH (ref 70–99)
Glucose-Capillary: 173 mg/dL — ABNORMAL HIGH (ref 70–99)

## 2022-10-08 MED ORDER — METOPROLOL TARTRATE 25 MG PO TABS
12.5000 mg | ORAL_TABLET | Freq: Two times a day (BID) | ORAL | Status: DC
  Administered 2022-10-08 – 2022-10-09 (×3): 12.5 mg
  Filled 2022-10-08 (×3): qty 1

## 2022-10-08 NOTE — Progress Notes (Signed)
Progress Note   Patient: Colin Wagner:[id number] DOB: [birth year] DOA: 10/06/2022     2 DOS: the patient was seen and examined on 10/08/2022   Brief hospital course: Colin Wagner is a 72 y.o. 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  Patient reports ongoing symptoms of not feeling well along with productive coughing spells.  On the date of admission EMS was contacted as patient was having seizure-like activity and worsening breathing. Per reports soft blood pressure appreciated and saturation in the mid 80s while using chronic 4 L supplementation.   Patient was transported to the hospital for further evaluation and management; workup demonstrating elevated WBCs, tachycardia, positive chest x-ray for right middle lung opacity/bronchitic changes and acute on chronic renal failure.   Fluid resuscitation initiated, cultures taken, antibiotics provided, TRH consulted to place patient in the hospital for further evaluation and management of sepsis due to pneumonia.  Assessment and Plan: * Sepsis due to pneumonia ([place]) -In the setting of aspiration pneumonia -Patient met sepsis criteria at time of admission -Patient COVID 63, RSV and influenza negative. -Continue to maintain adequate hydration; providing free water through PEG tube). -Continue current IV antibiotics using Unasyn -Continue to wean down oxygen supplementation to baseline as tolerated.  Aspiration pneumonia ([place]) -Patient with underlying history of seizures/seizure-like activity with presentation of right middle lobe new opacity,  bronchitic changes and worsening hypoxia. -Most likely in the setting of aspiration -Continue IV unasyn -repeat CXR in 6 weeks to assure resolution of infiltrates.   Acute on chronic respiratory failure with hypoxia ([facility]) -Patient chronically using around 3-4 L nasal cannula supplementation -With acute hypoxemic process driven by aspiration pneumonia -Continue current IV antibiotics, bronchodilator management and the use of flutter valve/antitussive medications. -Wean down oxygen supplementation to baseline as tolerated. -Continue following clinical response.  Seizure ([place]) -Continue the use of Keppra -Patient is actively following with neurology as an outpatient. -Recent MRI images demonstrated no acute focal abnormality.  GERD (gastroesophageal reflux disease) -Continue PPI.  Acute renal failure superimposed on stage 3b chronic kidney disease ([place]) -patient with AKI on CKD stage 3b In the setting of prerenal azotemia and continue use of nephrotoxic agents. -Improved and pretty much back to baseline now. -Continue to follow electrolytes and renal function trend -Continue minimizing nephrotoxic agent -Continue to maintain adequate hydration.    Malnutrition of moderate degree -Continue tube feedings -Appreciate assistance and recommendation by nutritional service. 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 -Will minimize/avoid the use of oral route meds. -Patient expressed sleeping and taking some of his medications orally without problems at home.  Hyponatremia -Appears to be chronic; but worsened in the setting of dehydration -Continue to follow electrolytes trend and further replete as needed.  Hypertension, uncontrolled -Initial presentation of soft blood pressure appreciated -Improving and rising after fluid  resuscitation -Continue current antihypertensive agents. -Continue to follow vital signs.  DM type 2 (diabetes mellitus, type 2) ([place]) -Continue holding oral hypoglycemic agents while inpatient -Hyperglycemia appreciated most likely in the use of tube feedings -Continue adjusted insulin therapy and tube feedings coverage as recommended  by diabetes coordinator -Continue to follow CBGs fluctuation and further adjust management as required. -A1c 8.9   Subjective:  No fever, no chest pain, reporting no nausea or vomiting.  Breathing is slowly improving.  4.5 L nasal cannula supplementation in place.  Still feeling short winded with activity and having intermittent coughing spells.  Physical Exam: Vitals:   10/08/22 0432 10/08/22 0500 10/08/22 0816 10/08/22 1200  BP: 105/64   (!) 85/66  Pulse: (!) 103   92  Resp: 20   15  Temp: 98.5 F (36.9 C)   98.1 F (36.7 C)  TempSrc:    Oral  SpO2: 96%  90% 98%  Weight:  63.9 kg    Height:       General exam: Alert, awake, oriented x 3; reports feeling short winded and having intermittent coughing spells.  4.5 L nasal cannula supplementation in place. Respiratory system: Positive rhonchi bilaterally; no using accessory muscles. Cardiovascular system:RRR. No rubs or gallops. Gastrointestinal system: Abdomen is nondistended, soft and nontender.  Positive bowel sounds; PEG tube in place. Central nervous system: Alert and oriented. No focal neurological deficits. Extremities: No cyanosis or clubbing; no edema. Skin: No petechiae. Psychiatry: Judgement and insight appear normal. Mood & affect appropriate.   Data Reviewed: CBGs: 153>> 116>> 146>> 173   Family Communication: No family at bedside at time of evaluation; patient's POA updated over the phone  Disposition: Status is: Inpatient Remains inpatient appropriate because: Continue IV antibiotics.   Planned Discharge Destination: Home  Time spent: 35 minutes  Author: Barton Dubois,  MD 10/08/2022 4:08 PM  For on call review [email or web address].

## 2022-10-09 DIAGNOSIS — R569 Unspecified convulsions: Secondary | ICD-10-CM | POA: Diagnosis not present

## 2022-10-09 DIAGNOSIS — A419 Sepsis, unspecified organism: Secondary | ICD-10-CM | POA: Diagnosis not present

## 2022-10-09 DIAGNOSIS — E440 Moderate protein-calorie malnutrition: Secondary | ICD-10-CM | POA: Diagnosis not present

## 2022-10-09 DIAGNOSIS — E871 Hypo-osmolality and hyponatremia: Secondary | ICD-10-CM | POA: Diagnosis not present

## 2022-10-09 DIAGNOSIS — J189 Pneumonia, unspecified organism: Secondary | ICD-10-CM | POA: Diagnosis not present

## 2022-10-09 LAB — CBC
HCT: 33 % — ABNORMAL LOW (ref 39.0–52.0)
Hemoglobin: 10.1 g/dL — ABNORMAL LOW (ref 13.0–17.0)
MCH: 27.9 pg (ref 26.0–34.0)
MCHC: 30.6 g/dL (ref 30.0–36.0)
MCV: 91.2 fL (ref 80.0–100.0)
Platelets: 239 10*3/uL (ref 150–400)
RBC: 3.62 MIL/uL — ABNORMAL LOW (ref 4.22–5.81)
RDW: 16.3 % — ABNORMAL HIGH (ref 11.5–15.5)
WBC: 7 10*3/uL (ref 4.0–10.5)
nRBC: 0 % (ref 0.0–0.2)

## 2022-10-09 LAB — BASIC METABOLIC PANEL
Anion gap: 9 (ref 5–15)
BUN: 33 mg/dL — ABNORMAL HIGH (ref 8–23)
CO2: 33 mmol/L — ABNORMAL HIGH (ref 22–32)
Calcium: 9.5 mg/dL (ref 8.9–10.3)
Chloride: 100 mmol/L (ref 98–111)
Creatinine, Ser: 1.29 mg/dL — ABNORMAL HIGH (ref 0.61–1.24)
GFR, Estimated: 59 mL/min — ABNORMAL LOW (ref >60)
Glucose, Bld: 130 mg/dL — ABNORMAL HIGH (ref 70–99)
Potassium: 4.3 mmol/L (ref 3.5–5.1)
Sodium: 142 mmol/L (ref 135–145)

## 2022-10-09 LAB — GLUCOSE, CAPILLARY
Glucose-Capillary: 147 mg/dL — ABNORMAL HIGH (ref 70–99)
Glucose-Capillary: 147 mg/dL — ABNORMAL HIGH (ref 70–99)
Glucose-Capillary: 165 mg/dL — ABNORMAL HIGH (ref 70–99)
Glucose-Capillary: 100 mg/dL — ABNORMAL HIGH (ref 70–99)
Glucose-Capillary: 77 mg/dL (ref 70–99)

## 2022-10-09 MED ORDER — AMOXICILLIN-POT CLAVULANATE 250-62.5 MG/5ML PO SUSR: 500.0000 mg | Freq: Two times a day (BID) | ORAL | 0 refills | Status: AC

## 2022-10-09 MED ORDER — ASPIRIN 81 MG PO CHEW: 81.0000 mg | CHEWABLE_TABLET | Freq: Every day | ORAL | 2 refills | Status: DC

## 2022-10-09 MED ORDER — ATORVASTATIN CALCIUM 40 MG PO TABS: 40.0000 mg | ORAL_TABLET | Freq: Every day | ORAL | Status: DC

## 2022-10-09 MED ORDER — SENNA 8.6 MG PO TABS: 1.0000 | ORAL_TABLET | Freq: Every evening | ORAL | 1 refills | Status: DC | PRN

## 2022-10-09 MED ORDER — LORAZEPAM 1 MG PO TABS: 1.0000 mg | ORAL_TABLET | Freq: Four times a day (QID) | ORAL | Status: DC | PRN

## 2022-10-09 MED ORDER — ACETAMINOPHEN 500 MG PO TABS: 1000.0000 mg | ORAL_TABLET | Freq: Four times a day (QID) | ORAL | Status: DC | PRN

## 2022-10-09 MED ORDER — METOPROLOL TARTRATE 25 MG PO TABS: 12.5000 mg | ORAL_TABLET | Freq: Two times a day (BID) | ORAL | 3 refills | Status: DC

## 2022-10-09 MED ORDER — HYDROCODONE-ACETAMINOPHEN 7.5-325 MG/15ML PO SOLN: 10.0000 mL | Freq: Four times a day (QID) | ORAL | 0 refills | Status: DC | PRN

## 2022-10-09 NOTE — Progress Notes (Signed)
Pt discharged home with wife. Pt was adamant about leaving and did not want to wait on EMS any longer. PIVs removed and PM meds given.

## 2022-10-09 NOTE — Discharge Summary (Signed)
Physician Discharge Summary   Patient: Colin Wagner MRN: [id number] DOB: [birth year]  Admit date:     10/06/2022  Discharge date: 10/09/22  Discharge Physician: Barton Dubois   PCP: [name], MD   Recommendations at discharge:  Repeat chest x-ray in 6-8 weeks to assure resolution of infiltrate Repeat basic metabolic panel to follow electrolytes and renal function Repeat CBC to follow hemoglobin/WBCs trend. Recommending goals of care discussion and advance care planning. Reassess blood pressure and adjust antihypertensive treatment as needed See below for any further specific recommendations.  Discharge Diagnoses: Principal Problem:   Sepsis due to pneumonia [facility]) Active Problems:   DM type 2 (diabetes mellitus, type 2) ([place])   Hypertension, uncontrolled   Hyponatremia   Carcinoma of buccal mucosa (HCC)   Malnutrition of moderate degree   Acute renal failure superimposed on stage 3b chronic kidney disease (HCC)   GERD (gastroesophageal reflux disease)   Seizure (HCC)   Acute on chronic respiratory failure with hypoxia (HCC)   Aspiration pneumonia [facility])  Hospital Course: Colin Wagner is a 72 y.o. 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  Patient reports ongoing symptoms of not feeling well along with productive coughing spells.  On the date of admission EMS was contacted as patient was having seizure-like activity and worsening breathing. Per reports soft blood pressure appreciated and saturation in the mid 80s while using chronic 4 L supplementation.   Patient was transported to  the hospital for further evaluation and management; workup demonstrating elevated WBCs, tachycardia, positive chest x-ray for right middle lung opacity/bronchitic changes and acute on chronic renal failure.   Fluid resuscitation initiated, cultures taken, antibiotics provided, TRH consulted to place patient in the hospital for further evaluation and management of sepsis due to pneumonia.  Assessment and Plan: * Sepsis due to pneumonia ([place]) -In the setting of aspiration pneumonia -Patient met sepsis criteria at time of admission; resolved at time of discharge. -Patient's COVID 19, RSV and influenza PCR were negative. -Continue to maintain adequate hydration; providing free water through PEG tube). -Complete antibiotic therapy at time of discharge using Augmentin suspension through PEG tube. -Patient's oxygen supplementation back to baseline (4 L through nasal cannula) saturation in the mid 90s to 100 range.  Aspiration pneumonia ([place]) -Patient with underlying history of seizures/seizure-like activity with presentation of right middle lobe new opacity, bronchitic changes and worsening hypoxia. -Adequately treated using IV Unasyn and subsequently transition to Augmentin suspension through PEG tube to complete antibiotic therapy. -repeat CXR in 6 weeks to assure resolution of infiltrates.   Acute on chronic respiratory failure with hypoxia (HCC) -Patient chronically using around 3-4 L nasal cannula supplementation -With acute hypoxemic process driven by aspiration pneumonia -Improved and with saturation back to baseline at discharge -Complete antibiotics as instructed and follow-up with PCP as recommended.   Seizure ([place]) -Continue the use of Keppra -Patient is actively following with neurology as an outpatient. -Recent MRI images demonstrated no acute focal abnormality.  GERD (gastroesophageal reflux disease) -Continue PPI.  Acute renal failure superimposed on stage 3b chronic kidney  disease ([place]) -patient with AKI on CKD stage 3b In the setting of prerenal azotemia and continue use of nephrotoxic agents. -  Improved and pretty much back to baseline now. -Continue to minimize the use of nephrotoxic agent -Continue to maintain adequate hydration -Repeat basic metabolic panel to follow renal function trend and stability.    Malnutrition of moderate degree -Continue tube feedings -Appreciate assistance and recommendation by nutritional service. -Continue to maintain adequate hydration  Carcinoma of buccal mucosa ([place]) -Continue outpatient follow-up with oncology and radiation oncology service -Status post PEG tube placement. -Will minimize/avoid the use of oral route meds. -Okay for patient to have sips and some oral medications after good oral hygiene.  Hyponatremia -Appears to be chronic; but worsened in the setting of dehydration -Stable and back to baseline -Continue to follow electrolytes trend.  Hypertension, uncontrolled -Initial presentation of soft blood pressure appreciated, most like a associated with continued use of home antihypertensive agents and sepsis physiology. -Improved, stabilized and rising at time of discharge -Continue adjusted dose of home metoprolol -Patient advised to maintain adequate hydration -Reassess blood pressure at follow-up visit.  DM type 2 (diabetes mellitus, type 2) ([place]) -Resume home hypoglycemic regimen -Close monitoring of patient's CBGs/A1c with further adjustment to his medications as required. -A1c checked during hospitalization 8.9   Consultants: Nutritional and diabetes coordinator service Procedures performed: See below for x-ray reports Disposition: Home Diet recommendation: Continue PEG tube feedings  DISCHARGE MEDICATION: Allergies as of 10/09/2022       Reactions   Trazodone Other (See Comments)   Seizure        Medication List     STOP taking these medications    amLODipine 10 MG  tablet Commonly known as: NORVASC   aspirin EC 81 MG tablet Replaced by: aspirin 81 MG chewable tablet   ferrous sulfate 325 (65 FE) MG tablet   HYDROcodone-acetaminophen 7.5-325 MG tablet Commonly known as: NORCO Replaced by: HYDROcodone-acetaminophen 7.5-325 mg/15 ml solution   metoprolol succinate 100 MG 24 hr tablet Commonly known as: Toprol XL   sildenafil 100 MG tablet Commonly known as: VIAGRA   tiZANidine 4 MG tablet Commonly known as: ZANAFLEX   zolpidem 5 MG tablet Commonly known as: AMBIEN       TAKE these medications    acetaminophen 500 MG tablet Commonly known as: TYLENOL Place 2 tablets (1,000 mg total) into feeding tube every 6 (six) hours as needed for mild pain, fever or headache. What changed:  how to take this reasons to take this   albuterol 108 (90 Base) MCG/ACT inhaler Commonly known as: VENTOLIN HFA Inhale 2 puffs into the lungs every 4 (four) hours as needed for wheezing or shortness of breath.   amoxicillin-clavulanate 250-62.5 MG/5ML suspension Commonly known as: AUGMENTIN Take 10 mLs (500 mg total) by mouth 2 (two) times daily for 7 days.   Anoro Ellipta 62.5-25 MCG/ACT Aepb Generic drug: umeclidinium-vilanterol Inhale 1 puff into the lungs daily.   aspirin 81 MG chewable tablet Place 1 tablet (81 mg total) into feeding tube daily. Start taking on: October 10, 2022 Replaces: aspirin EC 81 MG tablet   atorvastatin 40 MG tablet Commonly known as: LIPITOR Place 1 tablet (40 mg total) into feeding tube daily. What changed: how to take this   cholecalciferol 25 MCG (1000 UT) tablet Generic drug: Cholecalciferol Take 1,000 Units by mouth daily.   clopidogrel 75 MG tablet Commonly known as: PLAVIX Take 1 tablet (75 mg total) by mouth daily.   feeding supplement (GLUCERNA 1.5 CAL) Liqd Give 2 cartons 3 times per day (8am. Noon, 8pm).  Flush with 51ml water before and  after each feeding.  Give additional 154ml BID between feedings  for additional hydration. Start with 1 carton 3 times per day. Flush with 120 ml before and after each feeding.   fluticasone 50 MCG/ACT nasal spray Commonly known as: FLONASE Place 2 sprays into both nostrils daily.   HYDROcodone-acetaminophen 7.5-325 mg/15 ml solution Commonly known as: HYCET Place 10 mLs into feeding tube every 6 (six) hours as needed for moderate pain. Replaces: HYDROcodone-acetaminophen 7.5-325 MG tablet   Levemir FlexTouch 100 UNIT/ML FlexPen Generic drug: insulin detemir Inject 18 Units into the skin at bedtime. What changed: how much to take   levETIRAcetam 100 MG/ML solution Commonly known as: KEPPRA Take 7.5 mL (750mg ) twice a day   LORazepam 1 MG tablet Commonly known as: ATIVAN Place 1 tablet (1 mg total) into feeding tube every 6 (six) hours as needed for anxiety or sleep. What changed: how to take this   megestrol 40 MG/ML suspension Commonly known as: MEGACE Take 10 mLs (400 mg total) by mouth daily.   metoCLOPramide 10 MG tablet Commonly known as: REGLAN Take 1 tablet (10 mg total) by mouth every 6 (six) hours as needed (Hiccups.).   metoprolol tartrate 25 MG tablet Commonly known as: LOPRESSOR Place 0.5 tablets (12.5 mg total) into feeding tube 2 (two) times daily.   ondansetron 4 MG disintegrating tablet Commonly known as: ZOFRAN-ODT 4 mg every 8 (eight) hours as needed for nausea or vomiting.   pantoprazole 40 MG tablet Commonly known as: PROTONIX Take 1 tablet (40 mg total) by mouth 2 (two) times daily.   senna 8.6 MG Tabs tablet Commonly known as: SENOKOT Place 1 tablet (8.6 mg total) into feeding tube at bedtime as needed for mild constipation. May be needed as pain medication can constipate. What changed: how to take this   sucralfate 1 g tablet Commonly known as: Carafate Take 1 tablet (1 g total) by mouth 4 (four) times daily -  with meals and at bedtime. May dissolve 1 tab and glass of water and drink as needed    testosterone cypionate 200 MG/ML injection Commonly known as: DEPOTESTOSTERONE CYPIONATE Inject 1.5 mLs into the muscle every 14 (fourteen) days.        Follow-up Information     [name], MD. Schedule an appointment as soon as possible for a visit in 10 day(s).   Specialty: Family Medicine Contact information: [place] [address] [phone]                Discharge Exam: Filed Weights   10/06/22 1437 10/08/22 0500 10/09/22 0351  Weight: 64.4 kg 63.9 kg 65.1 kg   General exam: Alert, awake, oriented x 3; reports feeling significantly improved, no overnight events, no fever and with good saturation on his chronic supplementation. Respiratory system: Positive rhonchi bilaterally; no using accessory muscles. Cardiovascular system:RRR. No rubs or gallops. Gastrointestinal system: Abdomen is nondistended, soft and nontender.  Positive bowel sounds; PEG tube in place. Central nervous system: Alert and oriented. No focal neurological deficits. Extremities: No cyanosis or clubbing; no edema. Skin: No petechiae. Psychiatry: Judgement and insight appear normal. Mood & affect appropriate.   Condition at discharge: Stable and improved.  The results of significant diagnostics from this hospitalization (including imaging, microbiology, ancillary and laboratory) are listed below for reference.   Imaging Studies: DG Chest Portable 1 View  Result Date: 10/06/2022 CLINICAL DATA:  Shortness of breath. EXAM: PORTABLE CHEST 1 VIEW COMPARISON:  July 25, 2022.  July 10, 2022. FINDINGS: Stable cardiomediastinal silhouette. Left-sided Port-A-Cath is unchanged in position. Stable right upper lobe opacity is noted concerning for atelectasis noted on prior CT scan. New right midlung opacity is noted concerning for possible pneumonia. Minimal bibasilar subsegmental atelectasis or scarring is noted. The visualized skeletal structures are unremarkable. IMPRESSION:  Minimal bibasilar subsegmental atelectasis or scarring. New right midlung opacity is noted concerning for possible pneumonia. Followup PA and lateral chest X-ray is recommended in 3-4 weeks following trial of antibiotic therapy to ensure resolution and exclude underlying malignancy. Electronically Signed   By: Marijo Conception M.D.   On: 10/06/2022 10:53   MR BRAIN W WO CONTRAST  Result Date: 10/04/2022 CLINICAL DATA:  Seizure. EXAM: MRI HEAD WITHOUT AND WITH CONTRAST TECHNIQUE: Multiplanar, multiecho pulse sequences of the brain and surrounding structures were obtained without and with intravenous contrast. CONTRAST:  7 mL Vueway. COMPARISON:  Head CT 08/10/2022. FINDINGS: Brain: No acute infarct or hemorrhage. No mass or midline shift. Generalized atrophy without lobar predominance. No hydrocephalus or extra-axial collection. Basilar cisterns are patent. Symmetric size and signal of the hippocampi. No evidence of cortical dysgenesis. No foci of abnormal susceptibility. Vascular: Normal flow voids. Skull and upper cervical spine: Normal marrow signal and enhancement. Sinuses/Orbits: Left mastoid effusion.  No nasopharyngeal mass. Other: None. IMPRESSION: 1. No acute intracranial abnormality. 2. Age advanced, generalized atrophy without lobar predominance. Electronically Signed   By: Emmit Alexanders M.D.   On: 10/04/2022 13:52    Microbiology: Results for orders placed or performed during the hospital encounter of 10/06/22  Resp panel by RT-PCR (RSV, Flu A&B, Covid) Anterior Nasal Swab     Status: None   Collection Time: 10/06/22 10:54 AM   Specimen: Anterior Nasal Swab  Result Value Ref Range Status   SARS Coronavirus 2 by RT PCR NEGATIVE NEGATIVE Final    Comment: (NOTE) SARS-CoV-2 target nucleic acids are NOT DETECTED.  The SARS-CoV-2 RNA is generally detectable in upper respiratory specimens during the acute phase of infection. The lowest concentration of SARS-CoV-2 viral copies this assay can  detect is 138 copies/mL. A negative result does not preclude SARS-Cov-2 infection and should not be used as the sole basis for treatment or other patient management decisions. A negative result may occur with  improper specimen collection/handling, submission of specimen other than nasopharyngeal swab, presence of viral mutation(s) within the areas targeted by this assay, and inadequate number of viral copies(<138 copies/mL). A negative result must be combined with clinical observations, patient history, and epidemiological information. The expected result is Negative.  Fact Sheet for Patients:  EntrepreneurPulse.com.au  Fact Sheet for Healthcare Providers:  IncredibleEmployment.be  This test is no t yet approved or cleared by the [place] FDA and  has been authorized for detection and/or diagnosis of SARS-CoV-2 by FDA under an Emergency Use Authorization (EUA). This EUA will remain  in effect (meaning this test can be used) for the duration of the COVID-19 declaration under Section 564(b)(1) of the Act, 21 U.S.C.section 360bbb-3(b)(1), unless the authorization is terminated  or revoked sooner.       Influenza A by PCR NEGATIVE NEGATIVE Final   Influenza B by PCR NEGATIVE NEGATIVE Final    Comment: (NOTE) The Xpert Xpress SARS-CoV-2/FLU/RSV plus assay is intended as an aid in the diagnosis of influenza from Nasopharyngeal swab specimens and should not be used as a sole basis for treatment. Nasal washings and aspirates are unacceptable for Xpert Xpress SARS-CoV-2/FLU/RSV testing.  Fact Sheet for Patients: EntrepreneurPulse.com.au  Fact Sheet for Healthcare Providers: IncredibleEmployment.be  This test is not yet approved or cleared by the [place] FDA and has been authorized for detection and/or diagnosis of SARS-CoV-2 by FDA under an Emergency Use Authorization (EUA). This EUA will remain in  effect (meaning this test can be used) for the duration of the COVID-19 declaration under Section 564(b)(1) of the Act, 21 U.S.C. section 360bbb-3(b)(1), unless the authorization is terminated or revoked.     Resp Syncytial Virus by PCR NEGATIVE NEGATIVE Final    Comment: (NOTE) Fact Sheet for Patients: EntrepreneurPulse.com.au  Fact Sheet for Healthcare Providers: IncredibleEmployment.be  This test is not yet approved or cleared by the [place] FDA and has been authorized for detection and/or diagnosis of SARS-CoV-2 by FDA under an Emergency Use Authorization (EUA). This EUA will remain in effect (meaning this test can be used) for the duration of the COVID-19 declaration under Section 564(b)(1) of the Act, 21 U.S.C. section 360bbb-3(b)(1), unless the authorization is terminated or revoked.  Performed at [facility], [address], [place], [place] [address]   Culture, blood (routine x 2)     Status: None (Preliminary result)   Collection Time: 10/06/22 11:20 AM   Specimen: BLOOD  Result Value Ref Range Status   Specimen Description BLOOD LEFT ARM  Final   Special Requests   Final    BOTTLES DRAWN AEROBIC AND ANAEROBIC Blood Culture results may not be optimal due to an inadequate volume of blood received in culture bottles   Culture   Final    NO GROWTH 3 DAYS Performed at [facility], [address] [place], [place] [address]    Report Status PENDING  Incomplete  Culture, blood (routine x 2)     Status: None (Preliminary result)   Collection Time: 10/06/22 11:20 AM   Specimen: BLOOD  Result Value Ref Range Status   Specimen Description BLOOD LEFT ARM  Final   Special Requests   Final    BOTTLES DRAWN AEROBIC AND ANAEROBIC Blood Culture adequate volume   Culture   Final    NO GROWTH 3 DAYS Performed at [facility], [address], [place],  [address]    Report Status PENDING  Incomplete    Labs: CBC: Recent Labs   Lab 10/06/22 1120 10/07/22 0412 10/09/22 0526  WBC 16.5* 11.8* 7.0  NEUTROABS 15.4*  --   --   HGB 10.5* 10.2* 10.1*  HCT 33.6* 32.6* 33.0*  MCV 90.1 89.8 91.2  PLT 272 255 350   Basic Metabolic Panel: Recent Labs  Lab 10/06/22 1120 10/07/22 0412 10/09/22 0526  NA 133* 134* 142  K 5.0 4.7 4.3  CL 90* 95* 100  CO2 31 31 33*  GLUCOSE 312* 369* 130*  BUN 69* 51* 33*  CREATININE 2.44* 1.75* 1.29*  CALCIUM 9.1 9.1 9.5  MG  --  2.4  --   PHOS  --  3.2  --    Liver Function Tests: Recent Labs  Lab 10/06/22 1120  AST 19  ALT 11  ALKPHOS 91  BILITOT 0.7  PROT 7.2  ALBUMIN 3.2*   CBG: Recent Labs  Lab 10/08/22 1945 10/08/22 2347 10/09/22 0305 10/09/22 0725 10/09/22 1106  GLUCAP 138* 122* 147* 147* 165*    Discharge time spent: greater than 30 minutes.  Signed: Barton Dubois, MD [facility] 10/09/2022

## 2022-10-09 NOTE — Plan of Care (Signed)
  Problem: Education: Goal: Ability to describe self-care measures that may prevent or decrease complications (Diabetes Survival Skills Education) will improve Outcome: Progressing Goal: Individualized Educational Video(s) Outcome: Progressing   Problem: Coping: Goal: Ability to adjust to condition or change in health will improve Outcome: Progressing   Problem: Fluid Volume: Goal: Ability to maintain a balanced intake and output will improve Outcome: Progressing   Problem: Health Behavior/Discharge Planning: Goal: Ability to identify and utilize available resources and services will improve Outcome: Progressing Goal: Ability to manage health-related needs will improve Outcome: Progressing   Problem: Metabolic: Goal: Ability to maintain appropriate glucose levels will improve Outcome: Progressing   Problem: Nutritional: Goal: Maintenance of adequate nutrition will improve Outcome: Progressing Goal: Progress toward achieving an optimal weight will improve Outcome: Progressing   Problem: Skin Integrity: Goal: Risk for impaired skin integrity will decrease Outcome: Progressing   Problem: Tissue Perfusion: Goal: Adequacy of tissue perfusion will improve Outcome: Progressing   Problem: Education: Goal: Knowledge of General Education information will improve Description: Including pain rating scale, medication(s)/side effects and non-pharmacologic comfort measures Outcome: Progressing   Problem: Health Behavior/Discharge Planning: Goal: Ability to manage health-related needs will improve Outcome: Progressing   Problem: Clinical Measurements: Goal: Ability to maintain clinical measurements within normal limits will improve Outcome: Progressing Goal: Will remain free from infection Outcome: Progressing Goal: Diagnostic test results will improve Outcome: Progressing Goal: Respiratory complications will improve Outcome: Progressing Goal: Cardiovascular complication will  be avoided Outcome: Progressing   Problem: Activity: Goal: Risk for activity intolerance will decrease Outcome: Progressing   Problem: Nutrition: Goal: Adequate nutrition will be maintained Outcome: Progressing   Problem: Coping: Goal: Level of anxiety will decrease Outcome: Progressing   Problem: Elimination: Goal: Will not experience complications related to bowel motility Outcome: Progressing Goal: Will not experience complications related to urinary retention Outcome: Progressing   Problem: Pain Managment: Goal: General experience of comfort will improve Outcome: Progressing   Problem: Safety: Goal: Ability to remain free from injury will improve Outcome: Progressing   Problem: Skin Integrity: Goal: Risk for impaired skin integrity will decrease Outcome: Progressing   Problem: Activity: Goal: Ability to tolerate increased activity will improve Outcome: Progressing   Problem: Clinical Measurements: Goal: Ability to maintain a body temperature in the normal range will improve Outcome: Progressing   Problem: Respiratory: Goal: Ability to maintain adequate ventilation will improve Outcome: Progressing Goal: Ability to maintain a clear airway will improve Outcome: Progressing

## 2022-10-09 NOTE — Progress Notes (Addendum)
Prior to discharge patient on 3-4 L home O2.    No current TOC needs  - 3:25pm This RNCM spoke with patient's POA Jeanett Schlein who was inquiring about home health services. This RNCM explained she would need to contact Medicaid to get additional services set up. Patient does not have Medicare, therefore Medicaid will need to approve and set up [facility] services. This RNCM advised private duty nursing can assist however there is an out of pocket expense. Jeanett Schlein reports she can not afford. This RNCM encouraged patient's POA Jeanntte to contact the Medicaid SW again to inquire about assistance.  - 3:42pm This RNCM spoke with Jeanett Schlein reports patient can be transported via EMS, she will go home to get the home set up to receive him. Jeanett Schlein reports the following DME: WC, walker, cane, bedside commode. Jeanett Schlein is requesting a feeding tube pump, this RNCM notified MD. This RNCM spoke with Erasmo Downer with [employer] who will follow patient for feeding pump on Monday. [employer] will need RD to document the need for feeding tube pump and feed rate so that [employer] can provide for the patient. This RNCM will send email/secure message for RD to follow up on Monday. MD unable to assist. Recommended getting assistance with PCP as well.   No additional TOC needs.

## 2022-10-11 LAB — CULTURE, BLOOD (ROUTINE X 2)
Culture: NO GROWTH
Culture: NO GROWTH
Special Requests: ADEQUATE

## 2022-10-12 ENCOUNTER — Telehealth: Payer: Self-pay

## 2022-10-12 ENCOUNTER — Ambulatory Visit: Payer: Medicaid Other | Admitting: Gastroenterology

## 2022-10-12 NOTE — Telephone Encounter (Signed)
-----   Message from Cameron Sprang, MD sent at 10/07/2022 11:48 AM EST ----- Pls let them know the brain MRI did not show any tumor, stroke, or bleed. It showed age-related changes. We had discussed doing a 3-day EEG, would proceed with prolonged EEG (pls order 72-hour EEG if not yet done), thanks!

## 2022-10-12 NOTE — Telephone Encounter (Signed)
Pt called an informed that the brain MRI did not show any tumor, stroke, or bleed. It showed age-related changes. We had discussed doing a 3-day EEG, would proceed with prolonged EEG

## 2022-10-14 ENCOUNTER — Other Ambulatory Visit: Payer: Self-pay

## 2022-10-14 ENCOUNTER — Telehealth: Payer: Self-pay | Admitting: *Deleted

## 2022-10-19 ENCOUNTER — Encounter: Payer: Self-pay | Admitting: Medical Oncology

## 2022-10-19 ENCOUNTER — Telehealth: Payer: Self-pay | Admitting: Medical Oncology

## 2022-10-19 NOTE — Telephone Encounter (Signed)
  He missed his Cycle 3 imfinzi  on 02/14  Next appt and tx is 3/13.   Does he need f/u cxr /CT scan before? Creatinine on 02/17=1.29   If so please order at [facility].

## 2022-10-20 ENCOUNTER — Encounter: Payer: Self-pay | Admitting: Internal Medicine

## 2022-10-21 ENCOUNTER — Telehealth: Payer: Self-pay | Admitting: *Deleted

## 2022-10-25 ENCOUNTER — Ambulatory Visit: Payer: Medicare Other | Admitting: Urology

## 2022-10-26 ENCOUNTER — Encounter: Payer: Self-pay | Admitting: Gastroenterology

## 2022-10-26 ENCOUNTER — Ambulatory Visit: Payer: Medicaid Other | Admitting: Gastroenterology

## 2022-10-27 ENCOUNTER — Ambulatory Visit: Payer: Medicare Other | Admitting: Urology

## 2022-10-29 ENCOUNTER — Other Ambulatory Visit: Payer: Medicaid Other

## 2022-11-01 ENCOUNTER — Other Ambulatory Visit: Payer: Medicaid Other

## 2022-11-02 ENCOUNTER — Encounter: Payer: Self-pay | Admitting: Internal Medicine

## 2022-11-03 ENCOUNTER — Inpatient Hospital Stay: Admit: 2022-11-03 | Payer: Medicaid Other | Admitting: Pulmonary Disease

## 2022-11-03 ENCOUNTER — Inpatient Hospital Stay: Payer: Medicare Other

## 2022-11-03 ENCOUNTER — Inpatient Hospital Stay: Payer: Medicare Other | Admitting: Internal Medicine

## 2022-11-03 ENCOUNTER — Emergency Department (HOSPITAL_COMMUNITY): Payer: Medicare Other

## 2022-11-03 ENCOUNTER — Encounter (HOSPITAL_COMMUNITY): Payer: Self-pay

## 2022-11-03 ENCOUNTER — Inpatient Hospital Stay: Payer: Medicare Other | Admitting: Dietician

## 2022-11-03 ENCOUNTER — Inpatient Hospital Stay (HOSPITAL_COMMUNITY)
Admission: EM | Admit: 2022-11-03 | Discharge: 2022-11-22 | DRG: 871 | Disposition: E | Payer: Medicare Other | Attending: Internal Medicine | Admitting: Internal Medicine

## 2022-11-03 DIAGNOSIS — A419 Sepsis, unspecified organism: Principal | ICD-10-CM | POA: Diagnosis present

## 2022-11-03 DIAGNOSIS — G9341 Metabolic encephalopathy: Secondary | ICD-10-CM | POA: Diagnosis present

## 2022-11-03 DIAGNOSIS — E1122 Type 2 diabetes mellitus with diabetic chronic kidney disease: Secondary | ICD-10-CM | POA: Diagnosis present

## 2022-11-03 DIAGNOSIS — J9690 Respiratory failure, unspecified, unspecified whether with hypoxia or hypercapnia: Secondary | ICD-10-CM | POA: Diagnosis present

## 2022-11-03 DIAGNOSIS — Z8249 Family history of ischemic heart disease and other diseases of the circulatory system: Secondary | ICD-10-CM | POA: Diagnosis not present

## 2022-11-03 DIAGNOSIS — Z85528 Personal history of other malignant neoplasm of kidney: Secondary | ICD-10-CM

## 2022-11-03 DIAGNOSIS — N170 Acute kidney failure with tubular necrosis: Secondary | ICD-10-CM | POA: Diagnosis present

## 2022-11-03 DIAGNOSIS — Z1152 Encounter for screening for COVID-19: Secondary | ICD-10-CM

## 2022-11-03 DIAGNOSIS — E874 Mixed disorder of acid-base balance: Secondary | ICD-10-CM | POA: Diagnosis present

## 2022-11-03 DIAGNOSIS — Z9981 Dependence on supplemental oxygen: Secondary | ICD-10-CM

## 2022-11-03 DIAGNOSIS — J80 Acute respiratory distress syndrome: Secondary | ICD-10-CM | POA: Diagnosis present

## 2022-11-03 DIAGNOSIS — Z931 Gastrostomy status: Secondary | ICD-10-CM

## 2022-11-03 DIAGNOSIS — Z794 Long term (current) use of insulin: Secondary | ICD-10-CM

## 2022-11-03 DIAGNOSIS — K7200 Acute and subacute hepatic failure without coma: Secondary | ICD-10-CM | POA: Diagnosis present

## 2022-11-03 DIAGNOSIS — I129 Hypertensive chronic kidney disease with stage 1 through stage 4 chronic kidney disease, or unspecified chronic kidney disease: Secondary | ICD-10-CM | POA: Diagnosis present

## 2022-11-03 DIAGNOSIS — T17890A Other foreign object in other parts of respiratory tract causing asphyxiation, initial encounter: Secondary | ICD-10-CM | POA: Diagnosis present

## 2022-11-03 DIAGNOSIS — Z7982 Long term (current) use of aspirin: Secondary | ICD-10-CM

## 2022-11-03 DIAGNOSIS — Z85118 Personal history of other malignant neoplasm of bronchus and lung: Secondary | ICD-10-CM

## 2022-11-03 DIAGNOSIS — J9601 Acute respiratory failure with hypoxia: Secondary | ICD-10-CM

## 2022-11-03 DIAGNOSIS — E785 Hyperlipidemia, unspecified: Secondary | ICD-10-CM | POA: Diagnosis present

## 2022-11-03 DIAGNOSIS — Z87891 Personal history of nicotine dependence: Secondary | ICD-10-CM

## 2022-11-03 DIAGNOSIS — J440 Chronic obstructive pulmonary disease with acute lower respiratory infection: Secondary | ICD-10-CM | POA: Diagnosis present

## 2022-11-03 DIAGNOSIS — Z66 Do not resuscitate: Secondary | ICD-10-CM | POA: Diagnosis present

## 2022-11-03 DIAGNOSIS — J189 Pneumonia, unspecified organism: Secondary | ICD-10-CM | POA: Diagnosis present

## 2022-11-03 DIAGNOSIS — R6521 Severe sepsis with septic shock: Secondary | ICD-10-CM | POA: Diagnosis not present

## 2022-11-03 DIAGNOSIS — E875 Hyperkalemia: Secondary | ICD-10-CM | POA: Diagnosis present

## 2022-11-03 DIAGNOSIS — J9602 Acute respiratory failure with hypercapnia: Secondary | ICD-10-CM | POA: Diagnosis not present

## 2022-11-03 DIAGNOSIS — Z9221 Personal history of antineoplastic chemotherapy: Secondary | ICD-10-CM | POA: Diagnosis not present

## 2022-11-03 DIAGNOSIS — I214 Non-ST elevation (NSTEMI) myocardial infarction: Secondary | ICD-10-CM

## 2022-11-03 DIAGNOSIS — N179 Acute kidney failure, unspecified: Secondary | ICD-10-CM

## 2022-11-03 DIAGNOSIS — I2489 Other forms of acute ischemic heart disease: Secondary | ICD-10-CM | POA: Diagnosis present

## 2022-11-03 DIAGNOSIS — D649 Anemia, unspecified: Secondary | ICD-10-CM

## 2022-11-03 DIAGNOSIS — E1165 Type 2 diabetes mellitus with hyperglycemia: Secondary | ICD-10-CM | POA: Diagnosis present

## 2022-11-03 DIAGNOSIS — Z885 Allergy status to narcotic agent status: Secondary | ICD-10-CM

## 2022-11-03 DIAGNOSIS — K219 Gastro-esophageal reflux disease without esophagitis: Secondary | ICD-10-CM | POA: Diagnosis present

## 2022-11-03 DIAGNOSIS — R739 Hyperglycemia, unspecified: Secondary | ICD-10-CM | POA: Diagnosis not present

## 2022-11-03 DIAGNOSIS — N1832 Chronic kidney disease, stage 3b: Secondary | ICD-10-CM | POA: Diagnosis present

## 2022-11-03 DIAGNOSIS — Z79899 Other long term (current) drug therapy: Secondary | ICD-10-CM

## 2022-11-03 DIAGNOSIS — Z905 Acquired absence of kidney: Secondary | ICD-10-CM

## 2022-11-03 DIAGNOSIS — Z7902 Long term (current) use of antithrombotics/antiplatelets: Secondary | ICD-10-CM

## 2022-11-03 DIAGNOSIS — Z923 Personal history of irradiation: Secondary | ICD-10-CM

## 2022-11-03 LAB — COMPREHENSIVE METABOLIC PANEL
ALT: 146 U/L — ABNORMAL HIGH (ref 0–44)
AST: 129 U/L — ABNORMAL HIGH (ref 15–41)
Albumin: 2.4 g/dL — ABNORMAL LOW (ref 3.5–5.0)
Alkaline Phosphatase: 128 U/L — ABNORMAL HIGH (ref 38–126)
Anion gap: 15 (ref 5–15)
BUN: 97 mg/dL — ABNORMAL HIGH (ref 8–23)
CO2: 31 mmol/L (ref 22–32)
Calcium: 8.9 mg/dL (ref 8.9–10.3)
Chloride: 85 mmol/L — ABNORMAL LOW (ref 98–111)
Creatinine, Ser: 3.29 mg/dL — ABNORMAL HIGH (ref 0.61–1.24)
GFR, Estimated: 19 mL/min — ABNORMAL LOW (ref >60)
Glucose, Bld: 580 mg/dL (ref 70–99)
Potassium: 7.4 mmol/L (ref 3.5–5.1)
Sodium: 131 mmol/L — ABNORMAL LOW (ref 135–145)
Total Bilirubin: 0.5 mg/dL (ref 0.3–1.2)
Total Protein: 7.1 g/dL (ref 6.5–8.1)

## 2022-11-03 LAB — BASIC METABOLIC PANEL
Anion gap: 15 (ref 5–15)
BUN: 87 mg/dL — ABNORMAL HIGH (ref 8–23)
CO2: 28 mmol/L (ref 22–32)
Calcium: 8.2 mg/dL — ABNORMAL LOW (ref 8.9–10.3)
Chloride: 92 mmol/L — ABNORMAL LOW (ref 98–111)
Creatinine, Ser: 2.75 mg/dL — ABNORMAL HIGH (ref 0.61–1.24)
GFR, Estimated: 24 mL/min — ABNORMAL LOW (ref >60)
Glucose, Bld: 401 mg/dL — ABNORMAL HIGH (ref 70–99)
Potassium: 4.6 mmol/L (ref 3.5–5.1)
Sodium: 135 mmol/L (ref 135–145)

## 2022-11-03 LAB — TROPONIN I (HIGH SENSITIVITY)
Troponin I (High Sensitivity): 201 ng/L (ref <18)
Troponin I (High Sensitivity): 241 ng/L (ref <18)

## 2022-11-03 LAB — BLOOD GAS, ARTERIAL
Acid-Base Excess: 8.4 mmol/L — ABNORMAL HIGH (ref 0.0–2.0)
Bicarbonate: 36.2 mmol/L — ABNORMAL HIGH (ref 20.0–28.0)
Drawn by: 27016
FIO2: 100 %
O2 Saturation: 97.6 %
Patient temperature: 38.8
pCO2 arterial: 69 mmHg (ref 32–48)
pH, Arterial: 7.33 — ABNORMAL LOW (ref 7.35–7.45)
pO2, Arterial: 85 mmHg (ref 83–108)

## 2022-11-03 LAB — POCT I-STAT 7, (LYTES, BLD GAS, ICA,H+H)
Acid-base deficit: 7 mmol/L — ABNORMAL HIGH (ref 0.0–2.0)
Bicarbonate: 20.9 mmol/L (ref 20.0–28.0)
Calcium, Ion: 1.01 mmol/L — ABNORMAL LOW (ref 1.15–1.40)
HCT: 21 % — ABNORMAL LOW (ref 39.0–52.0)
Hemoglobin: 7.1 g/dL — ABNORMAL LOW (ref 13.0–17.0)
O2 Saturation: 78 %
Potassium: 3.1 mmol/L — ABNORMAL LOW (ref 3.5–5.1)
Sodium: 141 mmol/L (ref 135–145)
TCO2: 22 mmol/L (ref 22–32)
pCO2 arterial: 53.8 mmHg — ABNORMAL HIGH (ref 32–48)
pH, Arterial: 7.196 — CL (ref 7.35–7.45)
pO2, Arterial: 53 mmHg — ABNORMAL LOW (ref 83–108)

## 2022-11-03 LAB — CBC WITH DIFFERENTIAL/PLATELET
Abs Immature Granulocytes: 1 10*3/uL — ABNORMAL HIGH (ref 0.00–0.07)
Band Neutrophils: 43 %
Basophils Absolute: 0 10*3/uL (ref 0.0–0.1)
Basophils Relative: 0 %
Eosinophils Absolute: 0 10*3/uL (ref 0.0–0.5)
Eosinophils Relative: 0 %
HCT: 32.7 % — ABNORMAL LOW (ref 39.0–52.0)
Hemoglobin: 9.7 g/dL — ABNORMAL LOW (ref 13.0–17.0)
Lymphocytes Relative: 8 %
Lymphs Abs: 0.6 10*3/uL — ABNORMAL LOW (ref 0.7–4.0)
MCH: 27.6 pg (ref 26.0–34.0)
MCHC: 29.7 g/dL — ABNORMAL LOW (ref 30.0–36.0)
MCV: 92.9 fL (ref 80.0–100.0)
Metamyelocytes Relative: 13 %
Monocytes Absolute: 0.2 10*3/uL (ref 0.1–1.0)
Monocytes Relative: 2 %
Neutro Abs: 5.8 10*3/uL (ref 1.7–7.7)
Neutrophils Relative %: 34 %
Platelets: 326 10*3/uL (ref 150–400)
RBC: 3.52 MIL/uL — ABNORMAL LOW (ref 4.22–5.81)
RDW: 15.6 % — ABNORMAL HIGH (ref 11.5–15.5)
WBC Morphology: INCREASED
WBC: 7.5 10*3/uL (ref 4.0–10.5)
nRBC: 0.4 % — ABNORMAL HIGH (ref 0.0–0.2)

## 2022-11-03 LAB — MRSA NEXT GEN BY PCR, NASAL
MRSA by PCR Next Gen: NOT DETECTED
MRSA by PCR Next Gen: NOT DETECTED

## 2022-11-03 LAB — GLUCOSE, CAPILLARY
Glucose-Capillary: 380 mg/dL — ABNORMAL HIGH (ref 70–99)
Glucose-Capillary: 384 mg/dL — ABNORMAL HIGH (ref 70–99)

## 2022-11-03 LAB — RESP PANEL BY RT-PCR (RSV, FLU A&B, COVID)  RVPGX2
Influenza A by PCR: NEGATIVE
Influenza B by PCR: NEGATIVE
Resp Syncytial Virus by PCR: NEGATIVE
SARS Coronavirus 2 by RT PCR: NEGATIVE

## 2022-11-03 LAB — MAGNESIUM: Magnesium: 3 mg/dL — ABNORMAL HIGH (ref 1.7–2.4)

## 2022-11-03 LAB — CBG MONITORING, ED
Glucose-Capillary: 561 mg/dL (ref 70–99)
Glucose-Capillary: 398 mg/dL — ABNORMAL HIGH (ref 70–99)
Glucose-Capillary: 394 mg/dL — ABNORMAL HIGH (ref 70–99)

## 2022-11-03 LAB — I-STAT CHEM 8, ED
BUN: 115 mg/dL — ABNORMAL HIGH (ref 8–23)
Calcium, Ion: 1.12 mmol/L — ABNORMAL LOW (ref 1.15–1.40)
Chloride: 88 mmol/L — ABNORMAL LOW (ref 98–111)
Creatinine, Ser: 3.1 mg/dL — ABNORMAL HIGH (ref 0.61–1.24)
Glucose, Bld: 580 mg/dL (ref 70–99)
HCT: 32 % — ABNORMAL LOW (ref 39.0–52.0)
Hemoglobin: 10.9 g/dL — ABNORMAL LOW (ref 13.0–17.0)
Potassium: 6.9 mmol/L (ref 3.5–5.1)
Sodium: 128 mmol/L — ABNORMAL LOW (ref 135–145)
TCO2: 35 mmol/L — ABNORMAL HIGH (ref 22–32)

## 2022-11-03 LAB — BRAIN NATRIURETIC PEPTIDE: B Natriuretic Peptide: 1847 pg/mL — ABNORMAL HIGH (ref 0.0–100.0)

## 2022-11-03 LAB — LACTIC ACID, PLASMA
Lactic Acid, Venous: 4.9 mmol/L (ref 0.5–1.9)
Lactic Acid, Venous: 4.8 mmol/L (ref 0.5–1.9)

## 2022-11-03 MED ORDER — VASOPRESSIN 20 UNITS/100 ML INFUSION FOR SHOCK
0.0000 [IU]/min | INTRAVENOUS | Status: DC
  Administered 2022-11-03: 0.03 [IU]/min via INTRAVENOUS
  Filled 2022-11-03: qty 100

## 2022-11-03 MED ORDER — ETOMIDATE 2 MG/ML IV SOLN
INTRAVENOUS | Status: DC | PRN
  Administered 2022-11-03: 20 mg via INTRAVENOUS

## 2022-11-03 MED ORDER — LINEZOLID 600 MG/300ML IV SOLN
600.0000 mg | Freq: Two times a day (BID) | INTRAVENOUS | Status: DC
  Administered 2022-11-03: 600 mg via INTRAVENOUS
  Filled 2022-11-03 (×3): qty 300

## 2022-11-03 MED ORDER — ENOXAPARIN SODIUM 30 MG/0.3ML IJ SOSY
30.0000 mg | PREFILLED_SYRINGE | INTRAMUSCULAR | Status: DC
  Filled 2022-11-03: qty 0.3

## 2022-11-03 MED ORDER — SODIUM BICARBONATE 8.4 % IV SOLN
INTRAVENOUS | Status: AC
  Administered 2022-11-03: 100 meq via INTRAVENOUS
  Filled 2022-11-03: qty 100

## 2022-11-03 MED ORDER — SODIUM ZIRCONIUM CYCLOSILICATE 10 G PO PACK
10.0000 g | PACK | Freq: Every day | ORAL | Status: DC
  Administered 2022-11-03: 10 g
  Filled 2022-11-03: qty 2

## 2022-11-03 MED ORDER — SODIUM CHLORIDE 0.9 % IV BOLUS
1000.0000 mL | Freq: Once | INTRAVENOUS | Status: AC
  Administered 2022-11-03: 1000 mL via INTRAVENOUS

## 2022-11-03 MED ORDER — EPINEPHRINE HCL 5 MG/250ML IV SOLN IN NS
INTRAVENOUS | Status: AC
  Administered 2022-11-03: 20 ug/min via INTRAVENOUS
  Filled 2022-11-03: qty 250

## 2022-11-03 MED ORDER — PIPERACILLIN-TAZOBACTAM 3.375 G IVPB 30 MIN
3.3750 g | Freq: Once | INTRAVENOUS | Status: AC
  Administered 2022-11-03: 3.375 g via INTRAVENOUS
  Filled 2022-11-03: qty 50

## 2022-11-03 MED ORDER — NOREPINEPHRINE 4 MG/250ML-% IV SOLN
INTRAVENOUS | Status: AC
  Filled 2022-11-03: qty 250

## 2022-11-03 MED ORDER — LACTATED RINGERS IV SOLN: INTRAVENOUS | Status: DC

## 2022-11-03 MED ORDER — SODIUM BICARBONATE 8.4 % IV SOLN: 100.0000 meq | Freq: Once | INTRAVENOUS | Status: AC

## 2022-11-03 MED ORDER — FENTANYL CITRATE PF 50 MCG/ML IJ SOSY
25.0000 ug | PREFILLED_SYRINGE | INTRAMUSCULAR | Status: DC | PRN
  Administered 2022-11-03: 100 ug via INTRAVENOUS
  Administered 2022-11-03 (×2): 50 ug via INTRAVENOUS
  Administered 2022-11-03: 100 ug via INTRAVENOUS
  Filled 2022-11-03: qty 1
  Filled 2022-11-03 (×2): qty 2

## 2022-11-03 MED ORDER — NOREPINEPHRINE 4 MG/250ML-% IV SOLN
INTRAVENOUS | Status: AC
  Administered 2022-11-03: 40 ug/min via INTRAVENOUS
  Filled 2022-11-03: qty 250

## 2022-11-03 MED ORDER — PROPOFOL 1000 MG/100ML IV EMUL
5.0000 ug/kg/min | INTRAVENOUS | Status: DC
  Administered 2022-11-03: 10 ug/kg/min via INTRAVENOUS
  Administered 2022-11-03: 20 ug/kg/min via INTRAVENOUS
  Filled 2022-11-03: qty 100

## 2022-11-03 MED ORDER — SODIUM CHLORIDE 0.9 % IV SOLN
500.0000 mg | INTRAVENOUS | Status: DC
  Administered 2022-11-03: 500 mg via INTRAVENOUS
  Filled 2022-11-03: qty 5

## 2022-11-03 MED ORDER — SODIUM CHLORIDE 0.9 % IV SOLN
2.0000 g | Freq: Once | INTRAVENOUS | Status: AC
  Administered 2022-11-03: 2 g via INTRAVENOUS
  Filled 2022-11-03: qty 20

## 2022-11-03 MED ORDER — FENTANYL CITRATE PF 50 MCG/ML IJ SOSY
25.0000 ug | PREFILLED_SYRINGE | INTRAMUSCULAR | Status: DC | PRN
  Filled 2022-11-03: qty 1

## 2022-11-03 MED ORDER — PIPERACILLIN-TAZOBACTAM 3.375 G IVPB
3.3750 g | Freq: Two times a day (BID) | INTRAVENOUS | Status: DC
  Filled 2022-11-03: qty 50

## 2022-11-03 MED ORDER — PIPERACILLIN-TAZOBACTAM 3.375 G IVPB
3.3750 g | Freq: Three times a day (TID) | INTRAVENOUS | Status: DC
  Filled 2022-11-03 (×2): qty 50

## 2022-11-03 MED ORDER — EPINEPHRINE HCL 5 MG/250ML IV SOLN IN NS: 0.5000 ug/min | INTRAVENOUS | Status: DC

## 2022-11-03 MED ORDER — PANTOPRAZOLE SODIUM 40 MG IV SOLR: 40.0000 mg | Freq: Every day | INTRAVENOUS | Status: DC

## 2022-11-03 MED ORDER — ROCURONIUM BROMIDE 10 MG/ML (PF) SYRINGE
PREFILLED_SYRINGE | INTRAVENOUS | Status: AC
  Administered 2022-11-03: 100 mg via INTRAVENOUS
  Filled 2022-11-03: qty 10

## 2022-11-03 MED ORDER — INSULIN ASPART 100 UNIT/ML IV SOLN
5.0000 [IU] | Freq: Once | INTRAVENOUS | Status: AC
  Administered 2022-11-03: 5 [IU] via INTRAVENOUS

## 2022-11-03 MED ORDER — INSULIN REGULAR(HUMAN) IN NACL 100-0.9 UT/100ML-% IV SOLN
INTRAVENOUS | Status: DC
  Administered 2022-11-03: 8 [IU]/h via INTRAVENOUS
  Filled 2022-11-03: qty 100

## 2022-11-03 MED ORDER — NOREPINEPHRINE 4 MG/250ML-% IV SOLN
0.0000 ug/min | INTRAVENOUS | Status: DC
  Filled 2022-11-03: qty 500

## 2022-11-03 MED ORDER — CALCIUM GLUCONATE-NACL 1-0.675 GM/50ML-% IV SOLN
INTRAVENOUS | Status: AC
  Administered 2022-11-03: 1000 mg via INTRAVENOUS
  Filled 2022-11-03: qty 50

## 2022-11-03 MED ORDER — SODIUM BICARBONATE 8.4 % IV SOLN
50.0000 meq | Freq: Once | INTRAVENOUS | Status: AC
  Administered 2022-11-03: 50 meq via INTRAVENOUS
  Filled 2022-11-03: qty 50

## 2022-11-03 MED ORDER — DEXTROSE 50 % IV SOLN: 0.0000 mL | INTRAVENOUS | Status: DC | PRN

## 2022-11-03 MED ORDER — VANCOMYCIN HCL 1500 MG/300ML IV SOLN: 1500.0000 mg | Freq: Once | INTRAVENOUS | Status: DC

## 2022-11-03 MED ORDER — ORAL CARE MOUTH RINSE: 15.0000 mL | OROMUCOSAL | Status: DC | PRN

## 2022-11-03 MED ORDER — ONDANSETRON HCL 4 MG/2ML IJ SOLN: 4.0000 mg | Freq: Four times a day (QID) | INTRAMUSCULAR | Status: DC | PRN

## 2022-11-03 MED ORDER — SUCCINYLCHOLINE CHLORIDE 20 MG/ML IJ SOLN
INTRAMUSCULAR | Status: DC | PRN
  Administered 2022-11-03: 100 mg via INTRAVENOUS

## 2022-11-03 MED ORDER — DOCUSATE SODIUM 50 MG/5ML PO LIQD: 100.0000 mg | Freq: Two times a day (BID) | ORAL | Status: DC | PRN

## 2022-11-03 MED ORDER — CALCIUM GLUCONATE-NACL 1-0.675 GM/50ML-% IV SOLN
1.0000 g | Freq: Once | INTRAVENOUS | Status: AC
  Filled 2022-11-03: qty 50

## 2022-11-03 MED ORDER — CHLORHEXIDINE GLUCONATE CLOTH 2 % EX PADS
6.0000 | MEDICATED_PAD | Freq: Every day | CUTANEOUS | Status: DC
  Administered 2022-11-03: 6 via TOPICAL

## 2022-11-03 MED ORDER — ORAL CARE MOUTH RINSE
15.0000 mL | OROMUCOSAL | Status: DC
  Administered 2022-11-03: 15 mL via OROMUCOSAL

## 2022-11-03 MED ORDER — INSULIN ASPART 100 UNIT/ML IJ SOLN
5.0000 [IU] | Freq: Once | INTRAMUSCULAR | Status: AC
  Administered 2022-11-03: 5 [IU] via INTRAVENOUS
  Filled 2022-11-03: qty 1

## 2022-11-03 MED ORDER — ACETAMINOPHEN 650 MG RE SUPP
650.0000 mg | Freq: Once | RECTAL | Status: AC
  Administered 2022-11-03: 650 mg via RECTAL

## 2022-11-03 MED ORDER — DEXTROSE IN LACTATED RINGERS 5 % IV SOLN: INTRAVENOUS | Status: DC

## 2022-11-03 MED ORDER — POLYETHYLENE GLYCOL 3350 17 G PO PACK: 17.0000 g | PACK | Freq: Every day | ORAL | Status: DC | PRN

## 2022-11-03 MED ORDER — ROCURONIUM BROMIDE 50 MG/5ML IV SOLN: 100.0000 mg | Freq: Once | INTRAVENOUS | Status: AC

## 2022-11-03 MED ORDER — FAMOTIDINE 20 MG PO TABS: 20.0000 mg | ORAL_TABLET | Freq: Every day | ORAL | Status: DC

## 2022-11-05 LAB — PATHOLOGIST SMEAR REVIEW

## 2022-11-07 LAB — CULTURE, BLOOD (ROUTINE X 2): Special Requests: ADEQUATE

## 2022-11-08 LAB — CULTURE, BLOOD (ROUTINE X 2)
Culture: NO GROWTH
Special Requests: ADEQUATE

## 2022-11-22 NOTE — IPAL (Signed)
Interdisciplinary Goals of Care Family Meeting   Date carried out:: 11/02/2022  Location of the meeting: Phone conference  Member's involved: Physician, Bedside Registered Nurse, Social Worker, and Family Member or next of kin  [place] of Attorney or acting medical decision maker: Colin Wagner    Discussion: We discussed goals of care for Colin Wagner .    The Clinical status was relayed to patient's significant other over the phone in detail.   Updated and notified of patients medical condition.     Patient remains unresponsive and will not open eyes to command.   Patient is having a weak cough and struggling to remove secretions.   Patient with increased WOB and using accessory muscles to breathe Explained to family course of therapy and the modalities  Evidence of kidney failure   Patient with Progressive multiorgan failure with a very high probablity of a very minimal chance of meaningful recovery despite all aggressive and optimal medical therapy.  Code status: Full DNR  Disposition: Continue current acute care    Family are satisfied with Plan of action and management. All questions answered   Jacky Kindle MD [facility] See Amion for pager If no response to pager, please call [phone] until 7pm After 7pm, Please call E-link [phone]

## 2022-11-22 NOTE — ED Triage Notes (Signed)
Pt bib ems with reports of respiratory distress from home. Pt with oxygen in the 60's on NRB. Pt arrives unresponsive. Systolic of 70. CBG reading high with ems.

## 2022-11-22 NOTE — ED Notes (Signed)
Dr. Gilford Raid aware of BP. Levophed increased to 43mg. 5th Liter of saline started.

## 2022-11-22 NOTE — ED Notes (Signed)
[facility] called for transport at this time . Safal Halderman 

## 2022-11-22 NOTE — H&P (Signed)
NAME:  Colin Wagner, MRN:  [id number], DOB:  [birth year], LOS: 0 ADMISSION DATE:  11/08/2022, CONSULTATION DATE:  3/13 REFERRING MD:  Dr. 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  More recently he has undergone chemoradiation for stage IIIc NSCLC diagnosed 04/2022. Now on consolidation immunotherapy with Imfinzi. Last dose 1/16. 1/12 diagnosed with candidiasis esophagitis on endoscopy done for dysphagia. Admitted for sepsis secondary to pneumonia on 2/14, felt to be secondary to aspiration. He was treated with antibiotics, improved, and was discharged to home 2/17.  He again presented to [facility] ED 3/13 via EMS for altered mental status. Upon EMS arrival he was noted to be minimally responsive, hypoxic with O2 sats in the 60s and, hypotensive with SBP in the 70s. He was urgently intubated upon arrival to the emergency department. Also treated with empiric antibiotics and IV fluids. CXR consistent with new RLL PNA. Remained hypotensive despite fluid resuscitation and was started on pressors, requiring high doses quickly. 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   Significant Hospital Events: Including procedures, antibiotic start and  stop dates in addition to other pertinent events   3/13 admit for septic shock  Interim History / Subjective:  Patient is on multiple vasopressors, his MAP less than 60 With his O2 sat is in the 70s  Objective   Blood pressure 91/66, pulse (!) 108, temperature (!) 102 F (38.9 C), temperature source Rectal, resp. rate (!) 21, height '5\' 11"'$  (1.803 m), weight 63.9 kg, SpO2 100 %.    Vent Mode: PRVC FiO2 (%):  [100 %] 100 % Set Rate:  [20 bmp] 20 bmp Vt Set:  [600 mL] 600 mL PEEP:  [5 cmH20] 5 cmH20 Plateau Pressure:  [23 cmH20] 23 cmH20  No intake or output data in the 24 hours ending 11/07/2022 1131 Filed Weights   10/29/2022 1000  Weight: 63.9 kg    Examination:   Physical exam: General: Crtitically ill-appearing male, orally intubated HEENT: [place]/AT, eyes anicteric.  ETT and OGT in place Neuro: Eyes open, not following commands Chest: Tachypneic, procalcitonin b/l crackles right more than left, no wheezes or rhonchi Heart: Regular rate and rhythm, no murmurs or gallops Abdomen: Soft, nondistended, bowel sounds present Skin: No rash  Labs and images were reviewed  Resolved Hospital Problem list     Assessment & Plan:  Severe sepsis with septic shock Bilateral multifocal pneumonia right more than left Patient received 6 L of IV fluid so far Currently on multiple vasopressors including epinephrine, norepinephrine and vasopressin Continue broad-spectrum antibiotics with linezolid and Zosyn Follow-up respiratory culture Planned lactate -   Acute on chronic hypoxemic/hypercarbic respiratory failure Severe ARDS Patient is on 4 L oxygen via nasal cannula at baseline She presented hypoxic and hypercapnic, requiring emergent endotracheal intubation Vent setting was adjusted to prevent hypercapnia Despite being on 100% FiO2  and PEEP of 15, his PaO2 is only 56 Will perform bronchoscopy to see if we can remove mucous plugging on the right Continue on protective ventilation VAP  prevention bundle in place PAD protocol with propofol and fentanyl  Acute metabolic/septic encephalopathy in the setting of hypercapnia and AKI with severe sepsis Minimize sedation with RASS goal -1  Demand cardiac ischemia anemia Serum troponins are elevated EKG nonischemic/T wave changes Echocardiogram is pending  AKI on CKD IIIb likely due to septic ATN Hyperkalemia Mixed metabolic and respiratory acidosis Patient baseline BUN/creatinine 33/1.29 Now he presented with serum creatinine of 3 Serum potassium on admission was 7.4 Received insulin, bicarbonate and Lokelma Continue calcium gluconate Received multiple bicarbonate pushes Repeat serum potassium is 4.6 Avoid nephrotoxic agents Monitor intake and output  Shock liver Trend LFTs Avoid hepatotoxic agents  Diabetes type 2 with hyperglycemia Started on insulin infusion He presented with blood sugar 580 Monitor fingerstick goal 140-180  Best Practice (right click and "Reselect all SmartList Selections" daily)   Diet/type: NPO DVT prophylaxis: LMWH GI prophylaxis: PPI Lines: Central line, Arterial Line, and yes and it is still needed Foley:  Yes, and it is still needed Code Status:  DNR Last date of multidisciplinary goals of care discussion [3/13: Patient significant other was updated over the phone, decided to keep him DNR, continue current care]  Labs   CBC: Recent Labs  Lab 10/23/2022 0925 11/16/2022 0930  WBC 7.5  --   NEUTROABS 5.8  --   HGB 9.7* 10.9*  HCT 32.7* 32.0*  MCV 92.9  --   PLT 326  --     Basic Metabolic Panel: Recent Labs  Lab 11/17/2022 0925 11/13/2022 0930  NA 131* 128*  K 7.4* 6.9*  CL 85* 88*  CO2 31  --   GLUCOSE 580* 580*  BUN 97* 115*  CREATININE 3.29* 3.10*  CALCIUM 8.9  --   MG 3.0*  --    GFR: Estimated Creatinine Clearance: 19.5 mL/min (A) (by C-G formula based on SCr of 3.1 mg/dL (H)). Recent Labs  Lab 10/29/2022 0925 10/30/2022 1101  WBC 7.5  --   LATICACIDVEN 4.9*  4.8*    Liver Function Tests: Recent Labs  Lab 10/25/2022 0925  AST 129*  ALT 146*  ALKPHOS 128*  BILITOT 0.5  PROT 7.1  ALBUMIN 2.4*   No results for input(s): "LIPASE", "AMYLASE" in the last 168 hours. No results for input(s): "AMMONIA" in the last 168 hours.  ABG    Component Value Date/Time   TCO2 35 (H) 11/02/2022 0930     Coagulation Profile: No results for input(s): "INR", "PROTIME" in the last 168 hours.  Cardiac Enzymes: No results for input(s): "CKTOTAL", "CKMB", "CKMBINDEX", "TROPONINI" in the last 168 hours.  HbA1C: Hgb A1c MFr Bld  Date/Time Value Ref Range Status  10/06/2022 01:26 PM 8.9 (H) 4.8 - 5.6 % Final    Comment:    (NOTE) Pre diabetes:          5.7%-6.4%  Diabetes:              >6.4%  Glycemic control for   <7.0% adults with diabetes   07/13/2022 05:00 AM 9.4 (H) 4.8 - 5.6 % Final    Comment:    (NOTE) Pre diabetes:          5.7%-6.4%  Diabetes:              >6.4%  Glycemic control for   <7.0% adults with diabetes  CBG: Recent Labs  Lab 11/20/2022 0920 11/02/2022 1104  GLUCAP 561* 398*    Review of Systems:   Unable to obtain as patient is encephalopathic  Past Medical History:  He,  has a past medical history of Alcohol abuse, 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   Surgical History:   Past Surgical History:  Procedure Laterality Date   BALLOON DILATION  12/31/2021   Procedure: BALLOON DILATION;  Surgeon: [name], MD;  [facility];  Service: Endoscopy;;   BIOPSY  04/30/2022   Procedure: BIOPSY;   Surgeon: Maryjane Hurter, MD;  Location: [facility];  Service: Pulmonary;;   CAROTID STENT  06/2018   COLONOSCOPY  2003   Dr. [name], polyps   COLONOSCOPY  2005   Dr. [name], multiple diverticula   COLONOSCOPY  2008   Dr. [name], diverticulosis   COLONOSCOPY WITH PROPOFOL N/A 10/21/2016   Dr. [name]: Diverticulosis, two 5-7 mm polyps removed. path-tubular adenomas.  Next colonoscopy in 5 years.   COLONOSCOPY WITH PROPOFOL N/A 12/31/2021   Procedure: COLONOSCOPY WITH PROPOFOL;  Surgeon: [name], MD;  [facility];  Service: Endoscopy;  Laterality: N/A;  8:15am   ENDOBRONCHIAL ULTRASOUND Right 04/30/2022   Procedure: ENDOBRONCHIAL ULTRASOUND;  Surgeon: Maryjane Hurter, MD;  [facility];  Service: Pulmonary;  Laterality: Right;   ESOPHAGEAL BRUSHING  07/26/2022   Procedure: ESOPHAGEAL BRUSHING;  Surgeon: [name], DO;  [facility];  Service: Endoscopy;;   ESOPHAGEAL DILATION N/A 07/26/2022   Procedure: ESOPHAGEAL DILATION;  Surgeon: [name], DO;  [facility];  Service: Endoscopy;  Laterality: N/A;   ESOPHAGOGASTRODUODENOSCOPY     Multiple EGDs. 1999 EGD showed gastric ulcers, no H pylori and benign biopsies performed by Dr. [name]. 2001 gastric ulcer healed. Last EGD 2005 had gastritis.   ESOPHAGOGASTRODUODENOSCOPY (EGD) WITH PROPOFOL N/A 12/31/2021   narrowing/deformity at UES s/p baloon dilation   ESOPHAGOGASTRODUODENOSCOPY (EGD) WITH PROPOFOL N/A 07/26/2022   Procedure: ESOPHAGOGASTRODUODENOSCOPY (EGD) WITH PROPOFOL;  Surgeon: [name], DO;  [facility];  Service: Endoscopy;  Laterality: N/A;   ESOPHAGOGASTRODUODENOSCOPY (EGD) WITH PROPOFOL N/A 09/03/2022   Procedure: ESOPHAGOGASTRODUODENOSCOPY (EGD) WITH PROPOFOL;  Surgeon: [name], DO;  [facility];  Service: Endoscopy;  Laterality: N/A;  10:15 am   FINE NEEDLE ASPIRATION  04/30/2022   Procedure: FINE  NEEDLE ASPIRATION (FNA) LINEAR;  Surgeon: Maryjane Hurter, MD;  [facility];  Service: Pulmonary;;   IR GASTROSTOMY TUBE MOD SED  08/30/2022   IR GASTROSTOMY TUBE REMOVAL  02/08/2020   IR IMAGING GUIDED PORT INSERTION  05/26/2022   IR REPLACE G-TUBE SIMPLE WO FLUORO  03/20/2019   IR REPLACE G-TUBE SIMPLE WO FLUORO  09/21/2019   IR REPLC GASTRO/COLONIC TUBE PERCUT W/FLUORO  05/16/2018   Left partial mandibulectomy, Scapular free flap reconstruction, selective neck dissection, tracheotomy, and resection of intraoral palate cancer. Left 03/01/2017   [facility]   LUNG BIOPSY     MOUTH SURGERY     PARTIAL NEPHRECTOMY Left 2012   POLYPECTOMY  10/21/2016   Procedure: POLYPECTOMY;  Surgeon: [name], MD;  [facility];  Service: Endoscopy;;  hepatic flexure x2   POLYPECTOMY  12/31/2021   Procedure: POLYPECTOMY INTESTINAL;  Surgeon: [name], MD;  [facility];  Service: Endoscopy;;   SURGERY OF LIP  06/2019   lip revision   TRANSURETHRAL RESECTION OF PROSTATE N/A 03/01/2019   Procedure: TRANSURETHRAL RESECTION OF THE PROSTATE (TURP)WITH CYSTOSCOPY;  Surgeon: Raynelle Bring, MD;  Location: [facility];  Service: Urology;  Laterality: N/A;   VIDEO BRONCHOSCOPY Right 04/30/2022   Procedure: VIDEO BRONCHOSCOPY WITHOUT FLUORO;  Surgeon: Maryjane Hurter, MD;  [facility];  Service: Pulmonary;  Laterality: Right;     Social History:   reports that he quit smoking about 5 years ago. His smoking use included cigarettes. He has a 17.50 pack-year smoking history. He has never used smokeless tobacco. He reports that he does not drink alcohol and does not use drugs.   Family History:  His family history includes Hypertension in his mother. There is no history of Colon cancer.   Allergies Allergies  Allergen Reactions   Trazodone Other (See Comments)    Seizure     Home Medications  Prior to Admission medications    Medication Sig Start Date End Date Taking? Authorizing Provider  acetaminophen (TYLENOL) 500 MG tablet Place 2 tablets (1,000 mg total) into feeding tube every 6 (six) hours as needed for mild pain, fever or headache. 10/09/22  Yes Barton Dubois, MD  albuterol (VENTOLIN HFA) 108 (90 Base) MCG/ACT inhaler Inhale 2 puffs into the lungs every 4 (four) hours as needed for wheezing or shortness of breath. 03/29/22  Yes [provider]  ALPRAZolam Duanne Moron) 1 MG tablet Take 1 mg by mouth in the morning, at noon, in the evening, and at bedtime. 10/22/22  Yes [provider]  aspirin 81 MG chewable tablet Place 1 tablet (81 mg total) into feeding tube daily. 10/10/22  Yes Barton Dubois, MD  atorvastatin (LIPITOR) 40 MG tablet Place 1 tablet (40 mg total) into feeding tube daily. 10/09/22  Yes Barton Dubois, MD  clopidogrel (PLAVIX) 75 MG tablet Take 1 tablet (75 mg total) by mouth daily. 07/27/22  Yes Emokpae, Courage, MD  fluticasone (FLONASE) 50 MCG/ACT nasal spray Place 2 sprays into both nostrils daily.   Yes [provider]  HYDROcodone-acetaminophen (NORCO) 10-325 MG tablet 1 tablet every 4 (four) hours as needed for moderate pain. 10/22/22  Yes [provider]  levETIRAcetam (KEPPRA) 100 MG/ML solution Take 7.5 mL ('750mg'$ ) twice a day 08/24/22  Yes Cameron Sprang, MD  metoCLOPramide (REGLAN) 10 MG tablet Take 1 tablet (10 mg total) by mouth every 6 (six) hours as needed (Hiccups.). 07/14/22 07/14/23 Yes Eugenie Filler, MD  metoprolol tartrate (LOPRESSOR) 25 MG tablet Place 0.5 tablets (12.5 mg total) into feeding tube 2 (two) times daily. 10/09/22  Yes Barton Dubois, MD  Nutritional Supplements (FEEDING SUPPLEMENT, GLUCERNA 1.5 CAL,) LIQD Give 2 cartons 3 times per day (8am. Noon, 8pm).  Flush with 37m water before and after each feeding.  Give additional 1850mBID between feedings for additional hydration. Start with 1 carton 3 times per day. Flush with 120 ml before and  after each feeding. 08/30/22  Yes Heilingoetter, Cassandra L, PA-C  ondansetron (ZOFRAN-ODT) 4 MG disintegrating tablet 4 mg every 8 (eight) hours as needed for nausea or vomiting. 09/22/22  Yes [provider]  pantoprazole (PROTONIX) 40 MG tablet Take 1 tablet (40 mg total) by mouth 2 (two) times daily. 07/27/22  Yes EmRoxan HockeyMD  senna (SENOKOT) 8.6 MG TABS tablet Place 1 tablet (8.6 mg total) into feeding tube at bedtime as needed for mild constipation. May be needed as pain medication can constipate. 10/09/22  Yes Barton Dubois, MD  sucralfate (CARAFATE) 1 g tablet Take 1 tablet (1 g total) by mouth 4 (four) times daily -  with meals and at bedtime. May dissolve 1 tab and glass of water and drink as needed 07/27/22  Yes Emokpae, Courage, MD  testosterone cypionate (DEPOTESTOSTERONE CYPIONATE) 200 MG/ML injection Inject 1.5 mLs into the muscle every 14 (fourteen) days. 05/22/22  Yes [provider]  umeclidinium-vilanterol (ANORO ELLIPTA) 62.5-25 MCG/ACT AEPB Inhale 1 puff into the lungs daily. 07/27/22  Yes Emokpae, Courage, MD  HYDROcodone-acetaminophen (HYCET) 7.5-325 mg/15 ml solution Place 10 mLs into feeding tube every 6 (six) hours as needed for moderate pain. Patient not taking: Reported on 10/31/2022 10/09/22   Barton Dubois, MD  LEVEMIR FLEXTOUCH 100 UNIT/ML FlexTouch Pen Inject 18 Units into the skin at bedtime. Patient not taking: Reported on 10/24/2022 07/27/22   Roxan Hockey, MD  LORazepam (ATIVAN) 1 MG tablet Place 1 tablet (1 mg total) into feeding tube every 6 (six) hours as needed for anxiety or sleep. Patient not taking: Reported on 10/30/2022 10/09/22   Barton Dubois, MD  megestrol (MEGACE) 40 MG/ML suspension Take 10 mLs (400 mg total) by mouth daily. Patient not taking: Reported on 11/12/2022 07/27/22   Roxan Hockey, MD     Critical care time:     This patient is critically ill with multiple organ system failure which requires frequent high  complexity decision making, assessment, support, evaluation, and titration of therapies. This was completed through the application of advanced monitoring technologies and extensive interpretation of multiple databases.  During this encounter critical care time was devoted to patient care services described in this note for 55 minutes.    Jacky Kindle, MD [facility] See Amion for pager If no response to pager, please call [phone] until 7pm After 7pm, Please call E-link [phone]

## 2022-11-22 NOTE — Procedures (Signed)
Bronchoscopy Procedure Note  Colin Wagner  [id number]  1950/10/21  Date:11/19/2022  Time:6:07 PM   Provider Performing:Leighanna Kirn   Procedure(s):  Flexible bronchoscopy with bronchial alveolar lavage [id number]) and Initial Therapeutic Aspiration of Tracheobronchial Tree [id number]  Indication(s) Mucous plugging  Consent Unable to obtain consent due to emergent nature of procedure.  Anesthesia Propofol and rocuronium   Time Out Verified patient identification, verified procedure, site/side was marked, verified correct patient position, special equipment/implants available, medications/allergies/relevant history reviewed, required imaging and test results available.   Sterile Technique Usual hand hygiene, masks, gowns, and gloves were used   Procedure Description Bronchoscope advanced through endotracheal tube and into airway.  Airways were examined down to subsegmental level with findings noted below.   Following diagnostic evaluation, BAL(s) performed in right lower lobe with normal saline and return of pus colored fluid and Therapeutic aspiration performed in total of the respiratory 3  Findings: Copious amount of tenacious secretions noted throughout the respiratory tree, and the noted to be narrow with a cobblestone appearance on mucosa   Complications/Tolerance None; patient tolerated the procedure well. Chest X-ray is not needed post procedure.   EBL Minimal   Specimen(s) BAL

## 2022-11-22 NOTE — ED Provider Notes (Signed)
[name] Provider Note   CSN: [id number] Arrival date & time: 11/08/2022  [id number]     History  Chief Complaint  Patient presents with   Respiratory Distress    Colin Wagner is a 72 y.o. male.  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  Pt admitted in February with aspiration pneumonia.  Pt went home and was doing ok until yesterday when he started feeling poorly.  EMS was called to pt's home due to decreased responsiveness.  Pt had an O2 sat in the 60s on NRB.  Systolic bp in the [id number].  Pt unable to give any hx.       Home Medications Prior to Admission medications   Medication Sig Start Date End Date Taking? Authorizing Provider  acetaminophen (TYLENOL) 500 MG tablet Place 2 tablets (1,000 mg total) into feeding tube every 6 (six) hours as needed for mild pain, fever or headache. 10/09/22  Yes Barton Dubois, MD  albuterol (VENTOLIN HFA) 108 (90 Base) MCG/ACT inhaler Inhale 2 puffs into the lungs every 4 (four) hours as needed for wheezing or shortness of breath. 03/29/22  Yes [provider]  ALPRAZolam Duanne Moron) 1 MG tablet Take 1 mg by mouth in the morning, at noon, in the evening, and at bedtime. 10/22/22  Yes [provider]  aspirin 81 MG chewable tablet Place 1 tablet (81 mg total) into feeding tube daily. 10/10/22  Yes Barton Dubois, MD  atorvastatin (LIPITOR) 40 MG tablet Place 1 tablet (40 mg total) into feeding tube daily. 10/09/22  Yes Barton Dubois, MD  clopidogrel (PLAVIX) 75 MG tablet Take 1 tablet (75 mg total) by mouth daily. 07/27/22  Yes Emokpae, Courage, MD  fluticasone (FLONASE) 50 MCG/ACT nasal spray Place 2 sprays  into both nostrils daily.   Yes [provider]  HYDROcodone-acetaminophen (NORCO) 10-325 MG tablet 1 tablet every 4 (four) hours as needed for moderate pain. 10/22/22  Yes [provider]  levETIRAcetam (KEPPRA) 100 MG/ML solution Take 7.5 mL ('750mg'$ ) twice a day 08/24/22  Yes Cameron Sprang, MD  metoCLOPramide (REGLAN) 10 MG tablet Take 1 tablet (10 mg total) by mouth every 6 (six) hours as needed (Hiccups.). 07/14/22 07/14/23 Yes Eugenie Filler, MD  metoprolol tartrate (LOPRESSOR) 25 MG tablet Place 0.5 tablets (12.5 mg total) into feeding tube 2 (two) times daily. 10/09/22  Yes Barton Dubois, MD  Nutritional Supplements (FEEDING SUPPLEMENT, GLUCERNA 1.5 CAL,) LIQD Give 2 cartons 3 times per day (8am. Noon, 8pm).  Flush with 89m water before and after each feeding.  Give additional 1851mBID between feedings for additional hydration. Start with 1 carton 3 times per day. Flush with 120 ml before and after each feeding. 08/30/22  Yes Heilingoetter, Cassandra L, PA-C  ondansetron (ZOFRAN-ODT) 4 MG disintegrating tablet 4 mg every 8 (eight) hours as needed for nausea or vomiting. 09/22/22  Yes [provider]  pantoprazole (PROTONIX) 40 MG tablet Take 1 tablet (40 mg total) by mouth 2 (two) times daily. 07/27/22  Yes Emokpae, Courage, MD  senna (SENOKOT) 8.6 MG TABS tablet Place 1 tablet (8.6 mg total) into feeding tube at bedtime as needed for  mild constipation. May be needed as pain medication can constipate. 10/09/22  Yes Barton Dubois, MD  sucralfate (CARAFATE) 1 g tablet Take 1 tablet (1 g total) by mouth 4 (four) times daily -  with meals and at bedtime. May dissolve 1 tab and glass of water and drink as needed 07/27/22  Yes Emokpae, Courage, MD  testosterone cypionate (DEPOTESTOSTERONE CYPIONATE) 200 MG/ML injection Inject 1.5 mLs into the muscle every 14 (fourteen) days. 05/22/22  Yes [provider]  umeclidinium-vilanterol (ANORO ELLIPTA) 62.5-25 MCG/ACT AEPB Inhale  1 puff into the lungs daily. 07/27/22  Yes Emokpae, Courage, MD  HYDROcodone-acetaminophen (HYCET) 7.5-325 mg/15 ml solution Place 10 mLs into feeding tube every 6 (six) hours as needed for moderate pain. Patient not taking: Reported on 11/01/2022 10/09/22   Barton Dubois, MD  LEVEMIR FLEXTOUCH 100 UNIT/ML FlexTouch Pen Inject 18 Units into the skin at bedtime. Patient not taking: Reported on 10/28/2022 07/27/22   Roxan Hockey, MD  LORazepam (ATIVAN) 1 MG tablet Place 1 tablet (1 mg total) into feeding tube every 6 (six) hours as needed for anxiety or sleep. Patient not taking: Reported on 11/14/2022 10/09/22   Barton Dubois, MD  megestrol (MEGACE) 40 MG/ML suspension Take 10 mLs (400 mg total) by mouth daily. Patient not taking: Reported on 10/24/2022 07/27/22   Roxan Hockey, MD      Allergies    Trazodone    Review of Systems   Review of Systems  Unable to perform ROS: Patient unresponsive    Physical Exam Updated Vital Signs BP 104/71   Pulse (!) 110   Temp 99.1 F (37.3 C) (Axillary)   Resp (!) 25   Ht '5\' 11"'$  (1.803 m)   Wt 63.9 kg   SpO2 97%   BMI 19.65 kg/m  Physical Exam Vitals and nursing note reviewed.  Constitutional:      General: He is in acute distress.     Appearance: He is ill-appearing and toxic-appearing.  HENT:     Head:     Comments: Left jaw hard and enlarged (per GF normal for pt)    Right Ear: External ear normal.     Left Ear: External ear normal.     Nose: Nose normal.     Mouth/Throat:     Mouth: Mucous membranes are dry.  Eyes:     Pupils: Pupils are equal, round, and reactive to light.  Neck:     Comments: No deformities Cardiovascular:     Rate and Rhythm: Regular rhythm. Tachycardia present.     Pulses: Normal pulses.     Heart sounds: Normal heart sounds.  Pulmonary:     Breath sounds: Rhonchi and rales present.  Abdominal:     General: Abdomen is flat. Bowel sounds are normal.     Palpations: Abdomen is soft.  Musculoskeletal:         General: No deformity.  Skin:    General: Skin is dry.     Capillary Refill: Capillary refill takes more than 3 seconds.  Neurological:     Mental Status: He is unresponsive.  Psychiatric:     Comments: Unable to assess     ED Results / Procedures / Treatments   Labs (all labs ordered are listed, but only abnormal results are displayed) Labs Reviewed  BRAIN NATRIURETIC PEPTIDE - Abnormal; Notable for the following components:      Result Value   B Natriuretic Peptide 1,847.0 (*)    All other components within normal limits  CBC WITH DIFFERENTIAL/PLATELET - Abnormal; Notable for the following components:   RBC 3.52 (*)    Hemoglobin 9.7 (*)    HCT 32.7 (*)    MCHC 29.7 (*)    RDW 15.6 (*)    nRBC 0.4 (*)    Lymphs Abs 0.6 (*)    Abs Immature Granulocytes 1.00 (*)    All other components within normal limits  COMPREHENSIVE METABOLIC PANEL - Abnormal; Notable for the following components:   Sodium 131 (*)    Potassium 7.4 (*)    Chloride 85 (*)    Glucose, Bld 580 (*)    BUN 97 (*)    Creatinine, Ser 3.29 (*)    Albumin 2.4 (*)    AST 129 (*)    ALT 146 (*)    Alkaline Phosphatase 128 (*)    GFR, Estimated 19 (*)    All other components within normal limits  LACTIC ACID, PLASMA - Abnormal; Notable for the following components:   Lactic Acid, Venous 4.9 (*)    All other components within normal limits  LACTIC ACID, PLASMA - Abnormal; Notable for the following components:   Lactic Acid, Venous 4.8 (*)    All other components within normal limits  MAGNESIUM - Abnormal; Notable for the following components:   Magnesium 3.0 (*)    All other components within normal limits  BLOOD GAS, ARTERIAL - Abnormal; Notable for the following components:   pH, Arterial 7.33 (*)    pCO2 arterial 69 (*)    Bicarbonate 36.2 (*)    Acid-Base Excess 8.4 (*)    All other components within normal limits  BASIC METABOLIC PANEL - Abnormal; Notable for the following components:    Chloride 92 (*)    Glucose, Bld 401 (*)    BUN 87 (*)    Creatinine, Ser 2.75 (*)    Calcium 8.2 (*)    GFR, Estimated 24 (*)    All other components within normal limits  CBG MONITORING, ED - Abnormal; Notable for the following components:   Glucose-Capillary 561 (*)    All other components within normal limits  I-STAT CHEM 8, ED - Abnormal; Notable for the following components:   Sodium 128 (*)    Potassium 6.9 (*)    Chloride 88 (*)    BUN 115 (*)    Creatinine, Ser 3.10 (*)    Glucose, Bld 580 (*)    Calcium, Ion 1.12 (*)    TCO2 35 (*)    Hemoglobin 10.9 (*)    HCT 32.0 (*)    All other components within normal limits  CBG MONITORING, ED - Abnormal; Notable for the following components:   Glucose-Capillary 398 (*)    All other components within normal limits  CBG MONITORING, ED - Abnormal; Notable for the following components:   Glucose-Capillary 394 (*)    All other components within normal limits  TROPONIN I (HIGH SENSITIVITY) - Abnormal; Notable for the following components:   Troponin I (High Sensitivity) 201 (*)    All other components within normal limits  TROPONIN I (HIGH SENSITIVITY) - Abnormal; Notable for the following components:   Troponin I (High Sensitivity) 241 (*)    All other components within normal limits  MRSA NEXT GEN BY PCR, NASAL  RESP PANEL BY RT-PCR (RSV, FLU A&B, COVID)  [id number]  CULTURE, BLOOD (ROUTINE X 2)  CULTURE, BLOOD (ROUTINE X 2)  PATHOLOGIST SMEAR REVIEW    EKG EKG Interpretation  Date/Time:  Wednesday  November 03 2022 09:34:36 EDT Ventricular Rate:  108 PR Interval:  120 QRS Duration: 99 QT Interval:  321 QTC Calculation: 431 R Axis:   136 Text Interpretation: Sinus tachycardia with irregular rate Anteroseptal infarct, age indeterminate Since last tracing rate faster Confirmed by Isla Pence [phone]) on 10/25/2022 9:37:21 AM  Radiology DG Chest Port 1 View  Result Date: 11/19/2022 CLINICAL DATA:  Intubation. EXAM: PORTABLE  CHEST 1 VIEW COMPARISON:  Chest x-ray dated October 06, 2022. FINDINGS: Endotracheal tube tip in good position 3.8 cm above the carina. Enteric tube entering the stomach with the tip below the field of view. Unchanged left chest wall port catheter. The heart size and mediastinal contours are within normal limits. Unchanged right upper lobe collapse. New dense patchy consolidation in the right lower lobe. The left lung is clear. No pneumothorax or large pleural effusion. No acute osseous abnormality. IMPRESSION: 1. Appropriately positioned endotracheal and enteric tubes. 2. New right lower lobe pneumonia. 3. Unchanged right upper lobe collapse. Electronically Signed   By: Titus Dubin M.D.   On: 11/02/2022 10:15    Procedures Procedure Name: Intubation Date/Time: 10/31/2022 12:09 PM  Performed by: Isla Pence, MDOxygen Delivery Method: Ambu bag Induction Type: Rapid sequence Ventilation: Mask ventilation with difficulty Laryngoscope Size: Glidescope and 3 Tube size: 7.5 mm Number of attempts: 1 Placement Confirmation: ETT inserted through vocal cords under direct vision, Positive ETCO2 and Breath sounds checked- equal and bilateral Secured at: 23 cm Tube secured with: ETT holder Dental Injury: Teeth and Oropharynx as per pre-operative assessment  Difficulty Due To: Difficulty was anticipated Future Recommendations: Recommend- induction with short-acting agent, and alternative techniques readily available    .Central Line  Date/Time: 11/11/2022 12:10 PM  Performed by: Isla Pence, MD Authorized by: Isla Pence, MD   Consent:    Consent obtained:  Emergent situation Universal protocol:    Patient identity confirmed:  Arm band Pre-procedure details:    Indication(s): central venous access and insufficient peripheral access     Hand hygiene: Hand hygiene performed prior to insertion     Sterile barrier technique: All elements of maximal sterile technique followed     Skin  preparation:  Chlorhexidine   Skin preparation agent: Skin preparation agent completely dried prior to procedure   Anesthesia:    Anesthesia method:  Local infiltration   Local anesthetic:  Lidocaine 2% WITH epi Procedure details:    Location:  R femoral   Procedural supplies:  Triple lumen   Catheter size:  7 Fr   Landmarks identified: yes     Ultrasound guidance: no     Number of attempts:  1   Successful placement: yes   Post-procedure details:    Post-procedure:  Dressing applied and line sutured   Assessment:  Blood return through all ports and free fluid flow   Procedure completion:  Tolerated well, no immediate complications     Medications Ordered in ED Medications  fentaNYL (SUBLIMAZE) injection 25 mcg (has no administration in time range)  fentaNYL (SUBLIMAZE) injection 25-100 mcg (50 mcg Intravenous Given 11/05/2022 1321)  propofol (DIPRIVAN) 1000 MG/100ML infusion (0 mcg/kg/min  63.9 kg Intravenous Stopped 11/20/2022 1245)  norepinephrine (LEVOPHED) 4-5 MG/250ML-% infusion SOLN (has no administration in time range)  azithromycin (ZITHROMAX) 500 mg in sodium chloride 0.9 % 250 mL IVPB (0 mg Intravenous Stopped 10/22/2022 1208)  vasopressin (PITRESSIN) 20 Units in sodium chloride 0.9 % 100 mL infusion-*FOR SHOCK* (0.03 Units/min Intravenous New Bag/Given 10/31/2022 1136)  sodium zirconium cyclosilicate (  LOKELMA) packet 10 g (10 g Per Tube Given 11/07/2022 1209)  linezolid (ZYVOX) IVPB 600 mg (600 mg Intravenous New Bag/Given 10/23/2022 1245)  piperacillin-tazobactam (ZOSYN) IVPB 3.375 g (has no administration in time range)  norepinephrine (LEVOPHED) 4-5 MG/250ML-% infusion SOLN (has no administration in time range)  norepinephrine (LEVOPHED) 4-5 MG/250ML-% infusion SOLN (has no administration in time range)  sodium chloride 0.9 % bolus 1,000 mL (0 mLs Intravenous Stopped 11/06/2022 0946)  sodium chloride 0.9 % bolus 1,000 mL (0 mLs Intravenous Stopped 10/29/2022 1208)  insulin aspart (novoLOG)  injection 5 Units (5 Units Intravenous Given 11/05/2022 0941)  acetaminophen (TYLENOL) suppository 650 mg (650 mg Rectal Given 10/25/2022 1005)  cefTRIAXone (ROCEPHIN) 2 g in sodium chloride 0.9 % 100 mL IVPB (0 g Intravenous Stopped 11/08/2022 1056)  sodium bicarbonate injection 50 mEq (50 mEq Intravenous Given 11/18/2022 1048)  piperacillin-tazobactam (ZOSYN) IVPB 3.375 g (0 g Intravenous Stopped 10/25/2022 1259)  insulin aspart (novoLOG) injection 5 Units (5 Units Intravenous Given 11/11/2022 1256)    ED Course/ Medical Decision Making/ A&P                             Medical Decision Making Amount and/or Complexity of Data Reviewed Labs: ordered. Radiology: ordered.  Risk OTC drugs. Prescription drug management. Decision regarding hospitalization.   This patient presents to the ED for concern of AMS, this involves an extensive number of treatment options, and is a complaint that carries with it a high risk of complications and morbidity.  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  The pertinent results include:  cbc with wbc nl 7.5, hgb 9.7 (chronic), na low at 131, k elevated at 7.4, glucose elevated at 580, bun 97 and cr 3.29 (bun 33 and cr .129 on 2/17); lactic elevated at 4.9, bnp 1847, troponin elevated at 201; abg (at 1130) with ph 7.33, pco2 69, p02 85   Imaging Studies ordered:  I ordered  imaging studies including cxr  I independently visualized and interpreted imaging which showed   Appropriately positioned endotracheal and enteric tubes.  2. New right lower lobe pneumonia.  3. Unchanged right upper lobe collapse.   I agree with the radiologist interpretation   Cardiac Monitoring:  The patient was maintained on a cardiac monitor.  I personally viewed and interpreted the cardiac monitored which showed an underlying rhythm of: st   Medicines ordered and prescription drug management:  I ordered medication including rocephin/zithromax  for pna  Reevaluation of the patient after these medicines showed that the patient improved I have reviewed the patients home medicines and have made adjustments as needed  Critical Interventions:  Intubation/pressors   Consultations Obtained:  I requested consultation with the intensivist (Dr. Erin Fulling),  and discussed lab and imaging findings as well as pertinent plan - [facility]  He recommends adding a vasopressin drip, zosyn, linezolid, insulin drip  unless sugar continues trending down   Problem List / ED Course:  Resp failure:  pt intubated.  CO2 elevated 2 hours after intubation, so likely extremely high prior to intubation.   PNA:  CCM recommending broader spectrum abx coverage, so zosyn and linezolid added after rocephin/zithromax initially given Septic shock:  pt given sepsis fluids which did not help bp.  Levophed started and then Vasopressin.  Lactic is increasing. Hyperkalemia:  no EKG changes.  Insulin, bicarb, lokelma given Hyperglycemia:  IVFs and IV insulin NSTEMI:  likely from strain.  No EKG ST changes. Code status:  I spoke with pt's girlfriend.  She agreed for no CPR if he loses his HR, but try everything else until then.   Reevaluation:  After the interventions noted above, I reevaluated the patient and found that they have :stayed the same   Social Determinants of Health:  Lives at  home   Dispostion:  After consideration of the diagnostic results and the patients response to treatment, I feel that the patent would benefit from admission.  CRITICAL CARE Performed by: Isla Pence   Total critical care time: 120 minutes  Critical care time was exclusive of separately billable procedures and treating other patients.  Critical care was necessary to treat or prevent imminent or life-threatening deterioration.  Critical care was time spent personally by me on the following activities: development of treatment plan with patient and/or surrogate as well as nursing, discussions with consultants, evaluation of patient's response to treatment, examination of patient, obtaining history from patient or surrogate, ordering and performing treatments and interventions, ordering and review of laboratory studies, ordering and review of radiographic studies, pulse oximetry and re-evaluation of patient's condition.           Final Clinical Impression(s) / ED Diagnoses Final diagnoses:  Sepsis with acute hypoxic respiratory failure and septic shock, due to unspecified organism ([place])  AKI (acute kidney injury) ([place])  Hyperkalemia  Chronic anemia  Hyperglycemia  NSTEMI (non-ST elevated myocardial infarction) [facility])    Rx / DC Orders ED Discharge Orders     None         Isla Pence, MD 11/21/2022 1549

## 2022-11-22 NOTE — Death Summary Note (Signed)
DEATH SUMMARY   Patient Details  Name: Colin Wagner MRN: [id number] DOB: [birth year]  Admission/Discharge Information   Admit Date:  November 06, 2022  Date of Death: Date of Death: 11-06-22  Time of Death: Time of Death: 09-Nov-1821  Length of Stay: 0  Referring Physician: [name], MD   Reason(s) for Hospitalization  Severe sepsis with septic shock Bilateral multifocal pneumonia right more than left Acute on chronic hypoxemic/hypercarbic respiratory failure Severe ARDS Acute metabolic/septic encephalopathy in the setting of hypercapnia and AKI with severe sepsis  Demand cardiac ischemia anemia  AKI on CKD IIIb likely due to septic ATN Hyperkalemia Mixed metabolic and respiratory acidosis Shock liver  Diabetes type 2 with hyperglycemia   Diagnoses  Preliminary cause of death: Septic shock with multisystem organ failure Secondary Diagnoses (including complications and co-morbidities):  Principal Problem:   Respiratory failure ([place]) Active Problems:   AKI (acute kidney injury) ([place])   Sepsis with acute hypoxic respiratory failure and septic shock ([place])   Septic shock [facility])   Brief Hospital Course (including significant findings, care, treatment, and services provided and events leading to death)  Colin Wagner is a 72 y.o. 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  More recently he has undergone chemoradiation for stage IIIc NSCLC diagnosed 04/2022. Now on consolidation immunotherapy with Imfinzi. Last dose 1/16. 1/12 diagnosed with candidiasis esophagitis on endoscopy done for dysphagia. Admitted for sepsis secondary to pneumonia on 2/14, felt to be secondary to aspiration. He was treated with antibiotics, improved, and was discharged to home 2/17.   He again presented to [facility] ED Nov 06, 2022 via EMS for  altered mental status. Upon EMS arrival he was noted to be minimally responsive, hypoxic with O2 sats in the 60s and, hypotensive with SBP in the 70s. He was urgently intubated upon arrival to the emergency department. Also treated with empiric antibiotics and IV fluids. CXR consistent with new RLL PNA. Remained hypotensive despite fluid resuscitation and was started on pressors, requiring high doses quickly. He was transferred to [facility] for [facility] admission   Upon arrival to [facility], ICU, patient was noted to be hypotensive with MAP in 70 on Levophed and vasopressin, epinephrine infusion was started, arterial line was placed, ABG was done which showed pH 7.13, pCO2 57, PaO2 56 and O2 sat 78% Patient remains on 100% oxygen, PEEP was increased to 15.  Emergent bronchoscopy was performed to remove mucous plugging, despite that patient oxygen saturation remained in the low 70s, he continued to get hypotensive despite high-dose vasopressor support.  He received multiple boluses of sodium bicarbonate and calcium gluconate without much improvement in blood pressure.  Goals of care discussions were carried with family, they made him DNR per his wishes, despite aggressive measures patient lost his pulse and was declared dead.  Patient family was informed  Pertinent Labs and Studies  Significant Diagnostic Studies DG Chest Port 1 View  Result Date: Nov 06, 2022 CLINICAL DATA:  Intubation. EXAM: PORTABLE CHEST 1 VIEW COMPARISON:  Chest x-ray dated October 06, 2022. FINDINGS: Endotracheal tube tip in good position 3.8 cm above the carina. Enteric tube entering the stomach with the tip below the field of view. Unchanged left chest wall port catheter. The heart size and mediastinal contours are within normal limits. Unchanged right upper lobe collapse. New dense patchy consolidation  in the right lower lobe. The left lung is clear. No pneumothorax or large pleural effusion. No acute osseous abnormality. IMPRESSION: 1.  Appropriately positioned endotracheal and enteric tubes. 2. New right lower lobe pneumonia. 3. Unchanged right upper lobe collapse. Electronically Signed   By: Titus Dubin M.D.   On: 11/06/2022 10:15   DG Chest Portable 1 View  Result Date: 10/06/2022 CLINICAL DATA:  Shortness of breath. EXAM: PORTABLE CHEST 1 VIEW COMPARISON:  July 25, 2022.  July 10, 2022. FINDINGS: Stable cardiomediastinal silhouette. Left-sided Port-A-Cath is unchanged in position. Stable right upper lobe opacity is noted concerning for atelectasis noted on prior CT scan. New right midlung opacity is noted concerning for possible pneumonia. Minimal bibasilar subsegmental atelectasis or scarring is noted. The visualized skeletal structures are unremarkable. IMPRESSION: Minimal bibasilar subsegmental atelectasis or scarring. New right midlung opacity is noted concerning for possible pneumonia. Followup PA and lateral chest X-ray is recommended in 3-4 weeks following trial of antibiotic therapy to ensure resolution and exclude underlying malignancy. Electronically Signed   By: Marijo Conception M.D.   On: 10/06/2022 10:53    Microbiology Recent Results (from the past 240 hour(s))  MRSA Next Gen by PCR, Nasal     Status: None   Collection Time: 10/30/2022 12:12 PM   Specimen: Nasal Mucosa; Nasal Swab  Result Value Ref Range Status   MRSA by PCR Next Gen NOT DETECTED NOT DETECTED Final    Comment: (NOTE) The GeneXpert MRSA Assay (FDA approved for NASAL specimens only), is one component of a comprehensive MRSA colonization surveillance program. It is not intended to diagnose MRSA infection nor to guide or monitor treatment for MRSA infections. Test performance is not FDA approved in patients less than 38 years old. Performed at [facility], [address], [place], [place] [address]   Resp panel by RT-PCR (RSV, Flu A&B, Covid) Nasal Mucosa     Status: None   Collection Time: 10/28/2022 12:12 PM   Specimen: Nasal Mucosa;  Nasal Swab  Result Value Ref Range Status   SARS Coronavirus 2 by RT PCR NEGATIVE NEGATIVE Final    Comment: (NOTE) SARS-CoV-2 target nucleic acids are NOT DETECTED.  The SARS-CoV-2 RNA is generally detectable in upper respiratory specimens during the acute phase of infection. The lowest concentration of SARS-CoV-2 viral copies this assay can detect is 138 copies/mL. A negative result does not preclude SARS-Cov-2 infection and should not be used as the sole basis for treatment or other patient management decisions. A negative result may occur with  improper specimen collection/handling, submission of specimen other than nasopharyngeal swab, presence of viral mutation(s) within the areas targeted by this assay, and inadequate number of viral copies(<138 copies/mL). A negative result must be combined with clinical observations, patient history, and epidemiological information. The expected result is Negative.  Fact Sheet for Patients:  EntrepreneurPulse.com.au  Fact Sheet for Healthcare Providers:  IncredibleEmployment.be  This test is no t yet approved or cleared by the [place] FDA and  has been authorized for detection and/or diagnosis of SARS-CoV-2 by FDA under an Emergency Use Authorization (EUA). This EUA will remain  in effect (meaning this test can be used) for the duration of the COVID-19 declaration under Section 564(b)(1) of the Act, 21 U.S.C.section 360bbb-3(b)(1), unless the authorization is terminated  or revoked sooner.       Influenza A by PCR NEGATIVE NEGATIVE Final   Influenza B by PCR NEGATIVE NEGATIVE Final    Comment: (NOTE) The Xpert Xpress  SARS-CoV-2/FLU/RSV plus assay is intended as an aid in the diagnosis of influenza from Nasopharyngeal swab specimens and should not be used as a sole basis for treatment. Nasal washings and aspirates are unacceptable for Xpert Xpress SARS-CoV-2/FLU/RSV testing.  Fact Sheet for  Patients: EntrepreneurPulse.com.au  Fact Sheet for Healthcare Providers: IncredibleEmployment.be  This test is not yet approved or cleared by the [place] FDA and has been authorized for detection and/or diagnosis of SARS-CoV-2 by FDA under an Emergency Use Authorization (EUA). This EUA will remain in effect (meaning this test can be used) for the duration of the COVID-19 declaration under Section 564(b)(1) of the Act, 21 U.S.C. section 360bbb-3(b)(1), unless the authorization is terminated or revoked.     Resp Syncytial Virus by PCR NEGATIVE NEGATIVE Final    Comment: (NOTE) Fact Sheet for Patients: EntrepreneurPulse.com.au  Fact Sheet for Healthcare Providers: IncredibleEmployment.be  This test is not yet approved or cleared by the [place] FDA and has been authorized for detection and/or diagnosis of SARS-CoV-2 by FDA under an Emergency Use Authorization (EUA). This EUA will remain in effect (meaning this test can be used) for the duration of the COVID-19 declaration under Section 564(b)(1) of the Act, 21 U.S.C. section 360bbb-3(b)(1), unless the authorization is terminated or revoked.  Performed at [facility], [address], [place], [place] [address]   MRSA Next Gen by PCR, Nasal     Status: None   Collection Time: 11/01/2022  4:01 PM   Specimen: Nasal Mucosa; Nasal Swab  Result Value Ref Range Status   MRSA by PCR Next Gen NOT DETECTED NOT DETECTED Final    Comment: (NOTE) The GeneXpert MRSA Assay (FDA approved for NASAL specimens only), is one component of a comprehensive MRSA colonization surveillance program. It is not intended to diagnose MRSA infection nor to guide or monitor treatment for MRSA infections. Test performance is not FDA approved in patients less than 62 years old. Performed at [facility], [place] [address], [place],  [address]     Lab Basic Metabolic  Panel: Recent Labs  Lab 10/31/2022 0925 11/10/2022 0930 11/12/2022 1127  NA 131* 128* 135  K 7.4* 6.9* 4.6  CL 85* 88* 92*  CO2 31  --  28  GLUCOSE 580* 580* 401*  BUN 97* 115* 87*  CREATININE 3.29* 3.10* 2.75*  CALCIUM 8.9  --  8.2*  MG 3.0*  --   --    Liver Function Tests: Recent Labs  Lab 11/16/2022 0925  AST 129*  ALT 146*  ALKPHOS 128*  BILITOT 0.5  PROT 7.1  ALBUMIN 2.4*   No results for input(s): "LIPASE", "AMYLASE" in the last 168 hours. No results for input(s): "AMMONIA" in the last 168 hours. CBC: Recent Labs  Lab 11/18/2022 0925 11/02/2022 0930  WBC 7.5  --   NEUTROABS 5.8  --   HGB 9.7* 10.9*  HCT 32.7* 32.0*  MCV 92.9  --   PLT 326  --    Cardiac Enzymes: No results for input(s): "CKTOTAL", "CKMB", "CKMBINDEX", "TROPONINI" in the last 168 hours. Sepsis Labs: Recent Labs  Lab 10/30/2022 0925 11/15/2022 1101  WBC 7.5  --   LATICACIDVEN 4.9* 4.8*    Procedures/Operations     [employer] 11/11/2022, 6:36 PM

## 2022-11-22 NOTE — ED Notes (Signed)
Levophed increased to 50mg

## 2022-11-22 NOTE — Progress Notes (Signed)
[facility] is following code sepsis 

## 2022-11-22 NOTE — Progress Notes (Signed)
PHARMACY NOTE:  ANTIMICROBIAL RENAL DOSAGE ADJUSTMENT  Current antimicrobial regimen includes a mismatch between antimicrobial dosage and estimated renal function.  As per policy approved by the Pharmacy & Therapeutics and Medical Executive Committees, the antimicrobial dosage will be adjusted accordingly.  Current antimicrobial dosage:  Zosyn EID 3.375gm IV Q12H  Indication: sepsis  Renal Function:  Estimated Creatinine Clearance: 21.9 mL/min (A) (by C-G formula based on SCr of 2.75 mg/dL (H)). '[]'$      On intermittent HD, scheduled: '[]'$      On CRRT    Antimicrobial dosage has been changed to:  Zosyn EID 3.375gm IV Q8H   Jonus Coble D. Mina Marble, PharmD, BCPS, [place] 11/18/2022, 3:56 PM

## 2022-11-22 NOTE — Procedures (Signed)
Arterial Catheter Insertion Procedure Note  KIRIN BUELTEL  [id number]  1951/06/06  Date:11/05/2022  Time:5:37 PM    Provider Performing: Jacky Kindle    Procedure: Insertion of Arterial Line [phone]) with US guidance [id number])   Indication(s) Blood pressure monitoring and/or need for frequent ABGs  Consent Risks of the procedure as well as the alternatives and risks of each were explained to the patient and/or caregiver.  Consent for the procedure was obtained and is signed in the bedside chart  Anesthesia None   Time Out Verified patient identification, verified procedure, site/side was marked, verified correct patient position, special equipment/implants available, medications/allergies/relevant history reviewed, required imaging and test results available.   Sterile Technique Maximal sterile technique including full sterile barrier drape, hand hygiene, sterile gown, sterile gloves, mask, hair covering, sterile ultrasound probe cover (if used).   Procedure Description Area of catheter insertion was cleaned with chlorhexidine and draped in sterile fashion. With real-time ultrasound guidance an arterial catheter was placed into the left  Axillary  artery.  Appropriate arterial tracings confirmed on monitor.     Complications/Tolerance None; patient tolerated the procedure well.   EBL Minimal   Specimen(s) None

## 2022-11-22 NOTE — ED Notes (Signed)
Norepi gtt started at '5mg'$  per Verbal order.

## 2022-11-22 NOTE — Progress Notes (Addendum)
Pharmacy Antibiotic Note  Colin Wagner is a 71 y.o. male admitted on 11/21/2022 with HCAP vs aspriation pneumonia and sepsis.  Pharmacy has been consulted for Zosyn dosing.  Plan: Zosyn 3.375g IV every 12 hours. Zyvox per MD Monitor labs, c/s, and vanco levels as indicated.  Height: '5\' 11"'$  (180.3 cm) Weight: 63.9 kg (140 lb 14 oz) IBW/kg (Calculated) : 75.3  Temp (24hrs), Avg:102 F (38.9 C), Min:102 F (38.9 C), Max:102 F (38.9 C)  Recent Labs  Lab 10/27/2022 0925 11/02/2022 0930 11/07/2022 1101  WBC 7.5  --   --   CREATININE 3.29* 3.10*  --   LATICACIDVEN 4.9*  --  4.8*    Estimated Creatinine Clearance: 19.5 mL/min (A) (by C-G formula based on SCr of 3.1 mg/dL (H)).    Allergies  Allergen Reactions   Trazodone Other (See Comments)    Seizure    Antimicrobials this admission: Zyvox 3/13 >> Zosyn 3/13 >> Azith 3/13 >>  Microbiology results: 3/13 BCx: pending   3/13 MRSA PCR: pending  Thank you for allowing pharmacy to be a part of this patient's care.  Margot Ables, PharmD Clinical Pharmacist 11/13/2022 12:24 PM

## 2022-11-22 NOTE — ED Notes (Signed)
[facility] called to report [place] due to malfunction so there will be a delay in transport. Colin Wagner

## 2022-11-22 NOTE — ED Notes (Signed)
Levophed increased to 9mg per Dr. HGilford Raid Dr. HGilford Raidaware of hypotension and 4th liter of fluids initiated. Propofol paused at this time.

## 2022-11-22 NOTE — ED Notes (Signed)
EDP at beside placing central line

## 2022-11-22 DEATH — deceased

## 2022-11-30 ENCOUNTER — Other Ambulatory Visit: Payer: Medicaid Other

## 2022-11-30 ENCOUNTER — Other Ambulatory Visit (HOSPITAL_COMMUNITY): Payer: Self-pay

## 2022-11-30 ENCOUNTER — Ambulatory Visit: Payer: Medicaid Other | Admitting: Physician Assistant

## 2022-11-30 ENCOUNTER — Ambulatory Visit: Payer: Medicaid Other

## 2022-12-28 ENCOUNTER — Other Ambulatory Visit: Payer: Medicaid Other

## 2022-12-28 ENCOUNTER — Ambulatory Visit: Payer: Medicaid Other | Admitting: Internal Medicine

## 2022-12-28 ENCOUNTER — Ambulatory Visit: Payer: Medicaid Other
